# Patient Record
Sex: Male | Born: 1940
Health system: Southern US, Community
[De-identification: ages and names within clinical notes are randomized; demographics above are authoritative.]

## PROBLEM LIST (undated history)

## (undated) DIAGNOSIS — M1990 Unspecified osteoarthritis, unspecified site: Secondary | ICD-10-CM

## (undated) DIAGNOSIS — I498 Other specified cardiac arrhythmias: Secondary | ICD-10-CM

## (undated) DIAGNOSIS — J45909 Unspecified asthma, uncomplicated: Secondary | ICD-10-CM

## (undated) DIAGNOSIS — I2510 Atherosclerotic heart disease of native coronary artery without angina pectoris: Secondary | ICD-10-CM

## (undated) DIAGNOSIS — Z860100 Personal history of colon polyps, unspecified: Secondary | ICD-10-CM

## (undated) DIAGNOSIS — I739 Peripheral vascular disease, unspecified: Secondary | ICD-10-CM

## (undated) DIAGNOSIS — R634 Abnormal weight loss: Secondary | ICD-10-CM

## (undated) DIAGNOSIS — I10 Essential (primary) hypertension: Secondary | ICD-10-CM

## (undated) DIAGNOSIS — D696 Thrombocytopenia, unspecified: Secondary | ICD-10-CM

## (undated) DIAGNOSIS — M810 Age-related osteoporosis without current pathological fracture: Secondary | ICD-10-CM

## (undated) DIAGNOSIS — H409 Unspecified glaucoma: Secondary | ICD-10-CM

## (undated) DIAGNOSIS — T7840XA Allergy, unspecified, initial encounter: Secondary | ICD-10-CM

## (undated) DIAGNOSIS — Z87442 Personal history of urinary calculi: Secondary | ICD-10-CM

## (undated) DIAGNOSIS — I252 Old myocardial infarction: Secondary | ICD-10-CM

## (undated) DIAGNOSIS — M109 Gout, unspecified: Secondary | ICD-10-CM

## (undated) DIAGNOSIS — K900 Celiac disease: Secondary | ICD-10-CM

## (undated) DIAGNOSIS — K219 Gastro-esophageal reflux disease without esophagitis: Secondary | ICD-10-CM

## (undated) DIAGNOSIS — F419 Anxiety disorder, unspecified: Secondary | ICD-10-CM

## (undated) DIAGNOSIS — H3530 Unspecified macular degeneration: Secondary | ICD-10-CM

## (undated) DIAGNOSIS — E039 Hypothyroidism, unspecified: Secondary | ICD-10-CM

## (undated) DIAGNOSIS — E291 Testicular hypofunction: Secondary | ICD-10-CM

## (undated) DIAGNOSIS — H269 Unspecified cataract: Secondary | ICD-10-CM

## (undated) DIAGNOSIS — E7800 Pure hypercholesterolemia, unspecified: Secondary | ICD-10-CM

## (undated) DIAGNOSIS — R5381 Other malaise: Secondary | ICD-10-CM

## (undated) DIAGNOSIS — N189 Chronic kidney disease, unspecified: Secondary | ICD-10-CM

## (undated) DIAGNOSIS — R197 Diarrhea, unspecified: Secondary | ICD-10-CM

## (undated) DIAGNOSIS — N289 Disorder of kidney and ureter, unspecified: Secondary | ICD-10-CM

## (undated) DIAGNOSIS — Z7901 Long term (current) use of anticoagulants: Secondary | ICD-10-CM

## (undated) DIAGNOSIS — R5383 Other fatigue: Secondary | ICD-10-CM

## (undated) DIAGNOSIS — D126 Benign neoplasm of colon, unspecified: Secondary | ICD-10-CM

## (undated) HISTORY — DX: Celiac disease: K90.0

## (undated) HISTORY — DX: Other malaise: R53.83

## (undated) HISTORY — PX: OTHER SURGICAL HISTORY: SHX169

## (undated) HISTORY — DX: Unspecified glaucoma: H40.9

## (undated) HISTORY — DX: Diarrhea, unspecified: R19.7

## (undated) HISTORY — DX: Hypothyroidism, unspecified: E03.9

## (undated) HISTORY — PX: VASCULAR SURGERY: SHX849

## (undated) HISTORY — DX: Old myocardial infarction: I25.2

## (undated) HISTORY — DX: Personal history of colon polyps, unspecified: Z86.0100

## (undated) HISTORY — DX: Allergy, unspecified, initial encounter: T78.40XA

## (undated) HISTORY — DX: Unspecified cataract: H26.9

## (undated) HISTORY — DX: Essential (primary) hypertension: I10

## (undated) HISTORY — PX: EYE SURGERY: SHX253

## (undated) HISTORY — DX: Other malaise: R53.81

## (undated) HISTORY — DX: Abnormal weight loss: R63.4

## (undated) HISTORY — DX: Benign neoplasm of colon, unspecified: D12.6

## (undated) HISTORY — DX: Long term (current) use of anticoagulants: Z79.01

## (undated) HISTORY — DX: Unspecified osteoarthritis, unspecified site: M19.90

## (undated) HISTORY — PX: KNEE SURGERY: SHX244

## (undated) HISTORY — DX: Other specified cardiac arrhythmias: I49.8

## (undated) HISTORY — DX: Atherosclerotic heart disease of native coronary artery without angina pectoris: I25.10

## (undated) HISTORY — DX: Age-related osteoporosis without current pathological fracture: M81.0

## (undated) HISTORY — DX: Unspecified asthma, uncomplicated: J45.909

## (undated) HISTORY — DX: Testicular hypofunction: E29.1

## (undated) HISTORY — DX: Disorder of kidney and ureter, unspecified: N28.9

## (undated) HISTORY — DX: Peripheral vascular disease, unspecified: I73.9

## (undated) HISTORY — DX: Anxiety disorder, unspecified: F41.9

## (undated) HISTORY — PX: CORONARY ANGIOPLASTY: SHX604

## (undated) HISTORY — DX: Pure hypercholesterolemia, unspecified: E78.00

## (undated) HISTORY — PX: COLON SURGERY: SHX602

## (undated) HISTORY — DX: Gastro-esophageal reflux disease without esophagitis: K21.9

## (undated) HISTORY — DX: Personal history of colonic polyps: Z86.010

## (undated) HISTORY — DX: Gout, unspecified: M10.9

## (undated) HISTORY — PX: UPPER GASTROINTESTINAL ENDOSCOPY: SHX188

---

## 1996-10-31 HISTORY — PX: CYSTOSCOPY: SUR368

## 1999-04-08 ENCOUNTER — Encounter: Payer: Self-pay | Admitting: Internal Medicine

## 1999-04-08 ENCOUNTER — Ambulatory Visit (HOSPITAL_COMMUNITY): Admission: RE | Admit: 1999-04-08 | Discharge: 1999-04-08 | Payer: Self-pay | Admitting: Internal Medicine

## 1999-11-10 ENCOUNTER — Encounter: Admission: RE | Admit: 1999-11-10 | Discharge: 1999-11-10 | Payer: Self-pay | Admitting: Urology

## 1999-11-10 ENCOUNTER — Encounter: Payer: Self-pay | Admitting: Urology

## 1999-11-11 ENCOUNTER — Encounter: Payer: Self-pay | Admitting: Urology

## 1999-11-11 ENCOUNTER — Ambulatory Visit (HOSPITAL_COMMUNITY): Admission: RE | Admit: 1999-11-11 | Discharge: 1999-11-11 | Payer: Self-pay | Admitting: Urology

## 1999-11-16 ENCOUNTER — Encounter: Payer: Self-pay | Admitting: Urology

## 1999-11-16 ENCOUNTER — Encounter: Admission: RE | Admit: 1999-11-16 | Discharge: 1999-11-16 | Payer: Self-pay | Admitting: Urology

## 2001-03-07 ENCOUNTER — Encounter (INDEPENDENT_AMBULATORY_CARE_PROVIDER_SITE_OTHER): Payer: Self-pay | Admitting: Specialist

## 2001-03-07 ENCOUNTER — Other Ambulatory Visit: Admission: RE | Admit: 2001-03-07 | Discharge: 2001-03-07 | Payer: Self-pay | Admitting: Gastroenterology

## 2002-10-10 ENCOUNTER — Encounter: Admission: RE | Admit: 2002-10-10 | Discharge: 2002-10-10 | Payer: Self-pay | Admitting: Internal Medicine

## 2002-10-10 ENCOUNTER — Encounter: Payer: Self-pay | Admitting: Internal Medicine

## 2002-11-14 ENCOUNTER — Encounter: Admission: RE | Admit: 2002-11-14 | Discharge: 2002-11-14 | Payer: Self-pay | Admitting: Internal Medicine

## 2002-11-14 ENCOUNTER — Encounter: Payer: Self-pay | Admitting: Internal Medicine

## 2003-01-07 ENCOUNTER — Encounter: Payer: Self-pay | Admitting: Internal Medicine

## 2003-01-07 ENCOUNTER — Ambulatory Visit (HOSPITAL_COMMUNITY): Admission: RE | Admit: 2003-01-07 | Discharge: 2003-01-07 | Payer: Self-pay | Admitting: Internal Medicine

## 2003-09-10 ENCOUNTER — Encounter: Admission: RE | Admit: 2003-09-10 | Discharge: 2003-09-10 | Payer: Self-pay | Admitting: Neurosurgery

## 2003-09-24 ENCOUNTER — Encounter: Admission: RE | Admit: 2003-09-24 | Discharge: 2003-09-24 | Payer: Self-pay | Admitting: Neurosurgery

## 2003-10-08 ENCOUNTER — Encounter: Admission: RE | Admit: 2003-10-08 | Discharge: 2003-10-08 | Payer: Self-pay | Admitting: Neurosurgery

## 2003-12-26 ENCOUNTER — Encounter: Admission: RE | Admit: 2003-12-26 | Discharge: 2003-12-26 | Payer: Self-pay | Admitting: Neurosurgery

## 2004-06-03 ENCOUNTER — Encounter: Payer: Self-pay | Admitting: Emergency Medicine

## 2004-06-04 ENCOUNTER — Inpatient Hospital Stay (HOSPITAL_COMMUNITY): Admission: EM | Admit: 2004-06-04 | Discharge: 2004-06-08 | Payer: Self-pay | Admitting: Cardiology

## 2004-08-20 ENCOUNTER — Ambulatory Visit: Payer: Self-pay | Admitting: Cardiology

## 2004-08-23 ENCOUNTER — Encounter (HOSPITAL_COMMUNITY): Admission: RE | Admit: 2004-08-23 | Discharge: 2004-11-21 | Payer: Self-pay | Admitting: Cardiology

## 2004-09-30 ENCOUNTER — Ambulatory Visit: Payer: Self-pay | Admitting: Internal Medicine

## 2004-10-05 ENCOUNTER — Ambulatory Visit: Payer: Self-pay | Admitting: Cardiology

## 2004-11-22 ENCOUNTER — Encounter (HOSPITAL_COMMUNITY): Admission: RE | Admit: 2004-11-22 | Discharge: 2005-02-20 | Payer: Self-pay | Admitting: Cardiology

## 2004-11-22 ENCOUNTER — Ambulatory Visit: Payer: Self-pay | Admitting: Cardiology

## 2004-12-13 ENCOUNTER — Ambulatory Visit: Payer: Self-pay | Admitting: Internal Medicine

## 2005-05-16 ENCOUNTER — Ambulatory Visit: Payer: Self-pay | Admitting: Internal Medicine

## 2005-08-22 ENCOUNTER — Ambulatory Visit: Payer: Self-pay | Admitting: Cardiology

## 2005-09-01 ENCOUNTER — Ambulatory Visit: Payer: Self-pay | Admitting: Cardiology

## 2005-09-02 ENCOUNTER — Ambulatory Visit: Payer: Self-pay | Admitting: Hematology & Oncology

## 2005-12-01 ENCOUNTER — Ambulatory Visit: Payer: Self-pay | Admitting: Cardiology

## 2006-01-12 ENCOUNTER — Ambulatory Visit (HOSPITAL_BASED_OUTPATIENT_CLINIC_OR_DEPARTMENT_OTHER): Admission: RE | Admit: 2006-01-12 | Discharge: 2006-01-12 | Payer: Self-pay | Admitting: Orthopedic Surgery

## 2006-03-31 ENCOUNTER — Encounter: Admission: RE | Admit: 2006-03-31 | Discharge: 2006-03-31 | Payer: Self-pay | Admitting: Orthopedic Surgery

## 2006-05-08 ENCOUNTER — Ambulatory Visit: Payer: Self-pay | Admitting: Internal Medicine

## 2006-05-11 ENCOUNTER — Ambulatory Visit: Payer: Self-pay | Admitting: Gastroenterology

## 2006-05-15 ENCOUNTER — Ambulatory Visit: Payer: Self-pay | Admitting: Internal Medicine

## 2006-06-09 ENCOUNTER — Ambulatory Visit: Payer: Self-pay

## 2006-06-20 ENCOUNTER — Ambulatory Visit: Payer: Self-pay | Admitting: Cardiology

## 2006-07-07 ENCOUNTER — Encounter: Payer: Self-pay | Admitting: Gastroenterology

## 2006-07-07 ENCOUNTER — Ambulatory Visit: Payer: Self-pay | Admitting: Gastroenterology

## 2006-07-11 ENCOUNTER — Ambulatory Visit: Payer: Self-pay | Admitting: Internal Medicine

## 2006-09-11 ENCOUNTER — Ambulatory Visit: Payer: Self-pay | Admitting: Internal Medicine

## 2007-03-06 ENCOUNTER — Ambulatory Visit: Payer: Self-pay | Admitting: Cardiology

## 2007-03-12 ENCOUNTER — Ambulatory Visit: Payer: Self-pay | Admitting: Internal Medicine

## 2007-03-12 LAB — CONVERTED CEMR LAB
ALT: 32 units/L (ref 0–40)
AST: 31 units/L (ref 0–37)
Albumin: 3.9 g/dL (ref 3.5–5.2)
Alkaline Phosphatase: 64 units/L (ref 39–117)
BUN: 17 mg/dL (ref 6–23)
Basophils Absolute: 0 10*3/uL (ref 0.0–0.1)
Basophils Relative: 0.5 % (ref 0.0–1.0)
Bilirubin Urine: NEGATIVE
Bilirubin, Direct: 0.2 mg/dL (ref 0.0–0.3)
CO2: 31 meq/L (ref 19–32)
Calcium: 9 mg/dL (ref 8.4–10.5)
Chloride: 112 meq/L (ref 96–112)
Cholesterol: 92 mg/dL (ref 0–200)
Creatinine, Ser: 1.4 mg/dL (ref 0.4–1.5)
Eosinophils Absolute: 0.1 10*3/uL (ref 0.0–0.6)
Eosinophils Relative: 2.1 % (ref 0.0–5.0)
GFR calc Af Amer: 65 mL/min
GFR calc non Af Amer: 54 mL/min
Glucose, Bld: 106 mg/dL — ABNORMAL HIGH (ref 70–99)
HCT: 39.1 % (ref 39.0–52.0)
HDL: 25.2 mg/dL — ABNORMAL LOW (ref >39.0)
Hemoglobin, Urine: NEGATIVE
Hemoglobin: 13.1 g/dL (ref 13.0–17.0)
Ketones, ur: NEGATIVE mg/dL
LDL Cholesterol: 51 mg/dL (ref 0–99)
Leukocytes, UA: NEGATIVE
Lymphocytes Relative: 23.6 % (ref 12.0–46.0)
MCHC: 33.6 g/dL (ref 30.0–36.0)
MCV: 89.9 fL (ref 78.0–100.0)
Monocytes Absolute: 0.3 10*3/uL (ref 0.2–0.7)
Monocytes Relative: 9 % (ref 3.0–11.0)
Neutro Abs: 2.4 10*3/uL (ref 1.4–7.7)
Neutrophils Relative %: 64.8 % (ref 43.0–77.0)
Nitrite: NEGATIVE
PSA: 1.31 ng/mL (ref 0.10–4.00)
Platelets: 103 10*3/uL — ABNORMAL LOW (ref 150–400)
Potassium: 5.1 meq/L (ref 3.5–5.1)
RBC: 4.35 M/uL (ref 4.22–5.81)
RDW: 14.7 % — ABNORMAL HIGH (ref 11.5–14.6)
Sodium: 146 meq/L — ABNORMAL HIGH (ref 135–145)
Specific Gravity, Urine: 1.025 (ref 1.000–1.03)
TSH: 4.92 microintl units/mL (ref 0.35–5.50)
Testosterone: 387.49 ng/dL (ref 350.00–890)
Total Bilirubin: 1 mg/dL (ref 0.3–1.2)
Total CHOL/HDL Ratio: 3.7
Total Protein, Urine: NEGATIVE mg/dL
Total Protein: 5.9 g/dL — ABNORMAL LOW (ref 6.0–8.3)
Triglycerides: 79 mg/dL (ref 0–149)
Urine Glucose: NEGATIVE mg/dL
Urobilinogen, UA: 0.2 (ref 0.0–1.0)
VLDL: 16 mg/dL (ref 0–40)
WBC: 3.7 10*3/uL — ABNORMAL LOW (ref 4.5–10.5)
pH: 6 (ref 5.0–8.0)

## 2007-05-23 ENCOUNTER — Ambulatory Visit (HOSPITAL_BASED_OUTPATIENT_CLINIC_OR_DEPARTMENT_OTHER): Admission: RE | Admit: 2007-05-23 | Discharge: 2007-05-23 | Payer: Self-pay | Admitting: Urology

## 2007-07-25 ENCOUNTER — Ambulatory Visit: Payer: Self-pay | Admitting: Internal Medicine

## 2007-07-31 ENCOUNTER — Ambulatory Visit: Payer: Self-pay

## 2007-08-11 ENCOUNTER — Encounter: Payer: Self-pay | Admitting: Internal Medicine

## 2007-08-11 DIAGNOSIS — I25709 Atherosclerosis of coronary artery bypass graft(s), unspecified, with unspecified angina pectoris: Secondary | ICD-10-CM | POA: Insufficient documentation

## 2007-08-11 DIAGNOSIS — I10 Essential (primary) hypertension: Secondary | ICD-10-CM

## 2007-08-11 DIAGNOSIS — M109 Gout, unspecified: Secondary | ICD-10-CM

## 2007-08-11 DIAGNOSIS — I252 Old myocardial infarction: Secondary | ICD-10-CM

## 2007-08-11 DIAGNOSIS — I2510 Atherosclerotic heart disease of native coronary artery without angina pectoris: Secondary | ICD-10-CM | POA: Insufficient documentation

## 2007-08-11 DIAGNOSIS — E039 Hypothyroidism, unspecified: Secondary | ICD-10-CM | POA: Insufficient documentation

## 2007-08-22 ENCOUNTER — Telehealth: Payer: Self-pay | Admitting: Internal Medicine

## 2008-01-31 ENCOUNTER — Ambulatory Visit: Payer: Self-pay | Admitting: Internal Medicine

## 2008-01-31 DIAGNOSIS — R197 Diarrhea, unspecified: Secondary | ICD-10-CM | POA: Insufficient documentation

## 2008-01-31 DIAGNOSIS — J4540 Moderate persistent asthma, uncomplicated: Secondary | ICD-10-CM | POA: Insufficient documentation

## 2008-02-25 ENCOUNTER — Ambulatory Visit: Payer: Self-pay | Admitting: Gastroenterology

## 2008-03-03 ENCOUNTER — Telehealth: Payer: Self-pay | Admitting: Gastroenterology

## 2008-03-04 ENCOUNTER — Ambulatory Visit: Payer: Self-pay | Admitting: Gastroenterology

## 2008-03-04 LAB — CONVERTED CEMR LAB
ALT: 38 units/L (ref 0–53)
AST: 35 units/L (ref 0–37)
Albumin: 3.5 g/dL (ref 3.5–5.2)
Alkaline Phosphatase: 84 units/L (ref 39–117)
BUN: 13 mg/dL (ref 6–23)
Basophils Absolute: 0 10*3/uL (ref 0.0–0.1)
Basophils Relative: 0.3 % (ref 0.0–1.0)
CO2: 31 meq/L (ref 19–32)
Calcium: 8.9 mg/dL (ref 8.4–10.5)
Chloride: 111 meq/L (ref 96–112)
Creatinine, Ser: 1.5 mg/dL (ref 0.4–1.5)
Eosinophils Absolute: 0.1 10*3/uL (ref 0.0–0.7)
Eosinophils Relative: 2.7 % (ref 0.0–5.0)
GFR calc Af Amer: 60 mL/min
GFR calc non Af Amer: 50 mL/min
Glucose, Bld: 113 mg/dL — ABNORMAL HIGH (ref 70–99)
HCT: 39.8 % (ref 39.0–52.0)
Hemoglobin: 13.1 g/dL (ref 13.0–17.0)
Lymphocytes Relative: 21.5 % (ref 12.0–46.0)
MCHC: 33 g/dL (ref 30.0–36.0)
MCV: 92.1 fL (ref 78.0–100.0)
Monocytes Absolute: 0.4 10*3/uL (ref 0.1–1.0)
Monocytes Relative: 9.6 % (ref 3.0–12.0)
Neutro Abs: 2.9 10*3/uL (ref 1.4–7.7)
Neutrophils Relative %: 65.9 % (ref 43.0–77.0)
Platelets: 121 10*3/uL — ABNORMAL LOW (ref 150–400)
Potassium: 4.8 meq/L (ref 3.5–5.1)
RBC: 4.32 M/uL (ref 4.22–5.81)
RDW: 13.9 % (ref 11.5–14.6)
Sodium: 146 meq/L — ABNORMAL HIGH (ref 135–145)
TSH: 5.45 microintl units/mL (ref 0.35–5.50)
Tissue Transglutaminase Ab, IgA: 0 units (ref <7)
Total Bilirubin: 0.9 mg/dL (ref 0.3–1.2)
Total Protein: 5.8 g/dL — ABNORMAL LOW (ref 6.0–8.3)
WBC: 4.3 10*3/uL — ABNORMAL LOW (ref 4.5–10.5)

## 2008-03-05 ENCOUNTER — Ambulatory Visit: Payer: Self-pay | Admitting: Internal Medicine

## 2008-03-05 ENCOUNTER — Encounter: Payer: Self-pay | Admitting: Gastroenterology

## 2008-03-10 ENCOUNTER — Encounter (INDEPENDENT_AMBULATORY_CARE_PROVIDER_SITE_OTHER): Payer: Self-pay

## 2008-03-25 ENCOUNTER — Telehealth: Payer: Self-pay | Admitting: Gastroenterology

## 2008-04-01 DIAGNOSIS — D126 Benign neoplasm of colon, unspecified: Secondary | ICD-10-CM

## 2008-04-02 ENCOUNTER — Ambulatory Visit: Payer: Self-pay | Admitting: Gastroenterology

## 2008-04-02 DIAGNOSIS — K219 Gastro-esophageal reflux disease without esophagitis: Secondary | ICD-10-CM | POA: Insufficient documentation

## 2008-04-08 ENCOUNTER — Ambulatory Visit: Payer: Self-pay | Admitting: Gastroenterology

## 2008-04-08 ENCOUNTER — Encounter: Payer: Self-pay | Admitting: Gastroenterology

## 2008-04-08 DIAGNOSIS — R634 Abnormal weight loss: Secondary | ICD-10-CM | POA: Insufficient documentation

## 2008-04-09 ENCOUNTER — Encounter: Payer: Self-pay | Admitting: Gastroenterology

## 2008-04-10 ENCOUNTER — Telehealth (INDEPENDENT_AMBULATORY_CARE_PROVIDER_SITE_OTHER): Payer: Self-pay | Admitting: *Deleted

## 2008-04-10 ENCOUNTER — Ambulatory Visit: Payer: Self-pay | Admitting: Gastroenterology

## 2008-04-13 ENCOUNTER — Encounter: Payer: Self-pay | Admitting: Gastroenterology

## 2008-04-15 LAB — CONVERTED CEMR LAB: Tissue Transglutaminase Ab, IgA: 0 units (ref <7)

## 2008-04-22 ENCOUNTER — Encounter: Admission: RE | Admit: 2008-04-22 | Discharge: 2008-04-22 | Payer: Self-pay | Admitting: Gastroenterology

## 2008-04-29 ENCOUNTER — Ambulatory Visit: Payer: Self-pay | Admitting: Gastroenterology

## 2008-04-29 ENCOUNTER — Encounter: Admission: RE | Admit: 2008-04-29 | Discharge: 2008-04-29 | Payer: Self-pay | Admitting: Gastroenterology

## 2008-04-29 DIAGNOSIS — K900 Celiac disease: Secondary | ICD-10-CM

## 2008-04-29 DIAGNOSIS — K8020 Calculus of gallbladder without cholecystitis without obstruction: Secondary | ICD-10-CM | POA: Insufficient documentation

## 2008-05-05 ENCOUNTER — Encounter: Payer: Self-pay | Admitting: Gastroenterology

## 2008-05-05 ENCOUNTER — Ambulatory Visit: Payer: Self-pay | Admitting: Family Medicine

## 2008-05-19 ENCOUNTER — Telehealth (INDEPENDENT_AMBULATORY_CARE_PROVIDER_SITE_OTHER): Payer: Self-pay

## 2008-05-22 ENCOUNTER — Encounter (INDEPENDENT_AMBULATORY_CARE_PROVIDER_SITE_OTHER): Payer: Self-pay

## 2008-05-28 ENCOUNTER — Telehealth: Payer: Self-pay | Admitting: Gastroenterology

## 2008-06-03 ENCOUNTER — Ambulatory Visit: Payer: Self-pay | Admitting: Internal Medicine

## 2008-06-03 DIAGNOSIS — M810 Age-related osteoporosis without current pathological fracture: Secondary | ICD-10-CM | POA: Insufficient documentation

## 2008-06-03 LAB — CONVERTED CEMR LAB: Vit D, 1,25-Dihydroxy: 34 (ref 30–89)

## 2008-06-04 LAB — CONVERTED CEMR LAB
ALT: 24 units/L (ref 0–53)
AST: 25 units/L (ref 0–37)
BUN: 15 mg/dL (ref 6–23)
Basophils Absolute: 0 10*3/uL (ref 0.0–0.1)
Basophils Relative: 0.3 % (ref 0.0–3.0)
CO2: 27 meq/L (ref 19–32)
Calcium: 9 mg/dL (ref 8.4–10.5)
Chloride: 114 meq/L — ABNORMAL HIGH (ref 96–112)
Creatinine, Ser: 1.2 mg/dL (ref 0.4–1.5)
Eosinophils Absolute: 0.2 10*3/uL (ref 0.0–0.7)
Eosinophils Relative: 3.4 % (ref 0.0–5.0)
GFR calc Af Amer: 78 mL/min
GFR calc non Af Amer: 64 mL/min
Glucose, Bld: 97 mg/dL (ref 70–99)
HCT: 36.1 % — ABNORMAL LOW (ref 39.0–52.0)
Hemoglobin: 12.5 g/dL — ABNORMAL LOW (ref 13.0–17.0)
Lymphocytes Relative: 18.3 % (ref 12.0–46.0)
MCHC: 34.6 g/dL (ref 30.0–36.0)
MCV: 92.2 fL (ref 78.0–100.0)
Monocytes Absolute: 0.4 10*3/uL (ref 0.1–1.0)
Monocytes Relative: 7.9 % (ref 3.0–12.0)
Neutro Abs: 3.4 10*3/uL (ref 1.4–7.7)
Neutrophils Relative %: 70.1 % (ref 43.0–77.0)
Platelets: 133 10*3/uL — ABNORMAL LOW (ref 150–400)
Potassium: 4 meq/L (ref 3.5–5.1)
RBC: 3.92 M/uL — ABNORMAL LOW (ref 4.22–5.81)
RDW: 14.8 % — ABNORMAL HIGH (ref 11.5–14.6)
Sodium: 147 meq/L — ABNORMAL HIGH (ref 135–145)
TSH: 2.58 microintl units/mL (ref 0.35–5.50)
Testosterone: 232.65 ng/dL — ABNORMAL LOW (ref 350.00–890)
Vitamin B-12: 538 pg/mL (ref 211–911)
WBC: 4.9 10*3/uL (ref 4.5–10.5)

## 2008-07-01 ENCOUNTER — Ambulatory Visit: Payer: Self-pay

## 2008-07-01 ENCOUNTER — Encounter: Payer: Self-pay | Admitting: Internal Medicine

## 2008-07-02 ENCOUNTER — Ambulatory Visit: Payer: Self-pay | Admitting: Gastroenterology

## 2008-07-02 DIAGNOSIS — Z860100 Personal history of colon polyps, unspecified: Secondary | ICD-10-CM | POA: Insufficient documentation

## 2008-07-03 ENCOUNTER — Telehealth: Payer: Self-pay | Admitting: Internal Medicine

## 2008-07-03 ENCOUNTER — Ambulatory Visit: Payer: Self-pay | Admitting: Internal Medicine

## 2008-07-03 DIAGNOSIS — E291 Testicular hypofunction: Secondary | ICD-10-CM | POA: Insufficient documentation

## 2008-07-03 DIAGNOSIS — I498 Other specified cardiac arrhythmias: Secondary | ICD-10-CM

## 2008-07-04 ENCOUNTER — Ambulatory Visit: Payer: Self-pay | Admitting: Internal Medicine

## 2008-07-30 ENCOUNTER — Ambulatory Visit: Payer: Self-pay | Admitting: Internal Medicine

## 2008-08-04 ENCOUNTER — Ambulatory Visit: Payer: Self-pay | Admitting: Cardiology

## 2008-09-23 ENCOUNTER — Ambulatory Visit: Payer: Self-pay | Admitting: Internal Medicine

## 2008-09-26 LAB — CONVERTED CEMR LAB
ALT: 25 units/L (ref 0–53)
AST: 30 units/L (ref 0–37)
Albumin: 3.4 g/dL — ABNORMAL LOW (ref 3.5–5.2)
Alkaline Phosphatase: 105 units/L (ref 39–117)
BUN: 11 mg/dL (ref 6–23)
Bilirubin, Direct: 0.2 mg/dL (ref 0.0–0.3)
CO2: 29 meq/L (ref 19–32)
Calcium: 8.4 mg/dL (ref 8.4–10.5)
Chloride: 107 meq/L (ref 96–112)
Cholesterol: 74 mg/dL (ref 0–200)
Creatinine, Ser: 1.3 mg/dL (ref 0.4–1.5)
GFR calc Af Amer: 71 mL/min
GFR calc non Af Amer: 59 mL/min
Glucose, Bld: 89 mg/dL (ref 70–99)
HDL: 21.2 mg/dL — ABNORMAL LOW (ref >39.0)
Hgb A1c MFr Bld: 5.3 % (ref 4.6–6.0)
LDL Cholesterol: 40 mg/dL (ref 0–99)
Potassium: 4.7 meq/L (ref 3.5–5.1)
Sodium: 142 meq/L (ref 135–145)
Testosterone: 974.17 ng/dL — ABNORMAL HIGH (ref 350.00–890)
Total Bilirubin: 0.8 mg/dL (ref 0.3–1.2)
Total CHOL/HDL Ratio: 3.5
Total Protein: 6 g/dL (ref 6.0–8.3)
Triglycerides: 64 mg/dL (ref 0–149)
VLDL: 13 mg/dL (ref 0–40)

## 2008-10-01 ENCOUNTER — Ambulatory Visit: Payer: Self-pay | Admitting: Internal Medicine

## 2008-11-10 ENCOUNTER — Encounter: Payer: Self-pay | Admitting: Internal Medicine

## 2008-11-27 ENCOUNTER — Telehealth: Payer: Self-pay | Admitting: Internal Medicine

## 2009-01-05 ENCOUNTER — Ambulatory Visit: Payer: Self-pay | Admitting: Internal Medicine

## 2009-01-05 LAB — CONVERTED CEMR LAB
ALT: 24 units/L (ref 0–53)
AST: 27 units/L (ref 0–37)
Albumin: 3.4 g/dL — ABNORMAL LOW (ref 3.5–5.2)
Alkaline Phosphatase: 101 units/L (ref 39–117)
BUN: 13 mg/dL (ref 6–23)
Bilirubin, Direct: 0.2 mg/dL (ref 0.0–0.3)
CO2: 30 meq/L (ref 19–32)
Calcium: 8.4 mg/dL (ref 8.4–10.5)
Chloride: 110 meq/L (ref 96–112)
Cholesterol: 78 mg/dL (ref 0–200)
Creatinine, Ser: 1.8 mg/dL — ABNORMAL HIGH (ref 0.4–1.5)
GFR calc Af Amer: 49 mL/min
GFR calc non Af Amer: 40 mL/min
Glucose, Bld: 99 mg/dL (ref 70–99)
HDL: 24 mg/dL — ABNORMAL LOW (ref >39.0)
LDL Cholesterol: 45 mg/dL (ref 0–99)
Potassium: 5.2 meq/L — ABNORMAL HIGH (ref 3.5–5.1)
Sodium: 145 meq/L (ref 135–145)
TSH: 3.27 microintl units/mL (ref 0.35–5.50)
Total Bilirubin: 0.9 mg/dL (ref 0.3–1.2)
Total CHOL/HDL Ratio: 3.3
Total Protein: 5.5 g/dL — ABNORMAL LOW (ref 6.0–8.3)
Triglycerides: 43 mg/dL (ref 0–149)
VLDL: 9 mg/dL (ref 0–40)
Vitamin B-12: 487 pg/mL (ref 211–911)

## 2009-01-08 ENCOUNTER — Ambulatory Visit: Payer: Self-pay | Admitting: Internal Medicine

## 2009-01-09 ENCOUNTER — Encounter: Payer: Self-pay | Admitting: Internal Medicine

## 2009-01-14 ENCOUNTER — Telehealth: Payer: Self-pay | Admitting: Internal Medicine

## 2009-01-14 ENCOUNTER — Encounter: Payer: Self-pay | Admitting: Internal Medicine

## 2009-04-07 ENCOUNTER — Telehealth: Payer: Self-pay | Admitting: Internal Medicine

## 2009-05-05 ENCOUNTER — Ambulatory Visit: Payer: Self-pay | Admitting: Internal Medicine

## 2009-05-05 LAB — CONVERTED CEMR LAB
BUN: 14 mg/dL (ref 6–23)
CO2: 30 meq/L (ref 19–32)
Calcium: 8.5 mg/dL (ref 8.4–10.5)
Chloride: 106 meq/L (ref 96–112)
Cholesterol: 152 mg/dL (ref 0–200)
Creatinine, Ser: 2 mg/dL — ABNORMAL HIGH (ref 0.4–1.5)
GFR calc non Af Amer: 35.53 mL/min (ref >60)
Glucose, Bld: 91 mg/dL (ref 70–99)
HDL: 31.5 mg/dL — ABNORMAL LOW (ref >39.00)
Hgb A1c MFr Bld: 5.4 % (ref 4.6–6.5)
LDL Cholesterol: 108 mg/dL — ABNORMAL HIGH (ref 0–99)
Potassium: 5.4 meq/L — ABNORMAL HIGH (ref 3.5–5.1)
Sodium: 141 meq/L (ref 135–145)
TSH: 4.44 microintl units/mL (ref 0.35–5.50)
Total CHOL/HDL Ratio: 5
Triglycerides: 63 mg/dL (ref 0.0–149.0)
VLDL: 12.6 mg/dL (ref 0.0–40.0)

## 2009-05-12 ENCOUNTER — Ambulatory Visit: Payer: Self-pay | Admitting: Internal Medicine

## 2009-06-10 ENCOUNTER — Encounter (INDEPENDENT_AMBULATORY_CARE_PROVIDER_SITE_OTHER): Payer: Self-pay | Admitting: *Deleted

## 2009-06-17 ENCOUNTER — Encounter: Payer: Self-pay | Admitting: Internal Medicine

## 2009-08-05 ENCOUNTER — Ambulatory Visit: Payer: Self-pay | Admitting: Gastroenterology

## 2009-08-12 ENCOUNTER — Telehealth: Payer: Self-pay | Admitting: Gastroenterology

## 2009-08-12 ENCOUNTER — Ambulatory Visit: Payer: Self-pay | Admitting: Internal Medicine

## 2009-08-19 ENCOUNTER — Encounter: Payer: Self-pay | Admitting: Gastroenterology

## 2009-08-19 ENCOUNTER — Ambulatory Visit: Payer: Self-pay | Admitting: Gastroenterology

## 2009-08-19 LAB — HM COLONOSCOPY

## 2009-08-21 ENCOUNTER — Encounter: Payer: Self-pay | Admitting: Gastroenterology

## 2009-08-31 DIAGNOSIS — E7800 Pure hypercholesterolemia, unspecified: Secondary | ICD-10-CM | POA: Insufficient documentation

## 2009-09-01 ENCOUNTER — Telehealth: Payer: Self-pay | Admitting: Cardiology

## 2009-09-01 ENCOUNTER — Ambulatory Visit: Payer: Self-pay | Admitting: Cardiology

## 2009-09-29 ENCOUNTER — Telehealth: Payer: Self-pay | Admitting: Internal Medicine

## 2009-10-31 HISTORY — PX: CORONARY STENT PLACEMENT: SHX1402

## 2009-11-03 ENCOUNTER — Ambulatory Visit: Payer: Self-pay | Admitting: Internal Medicine

## 2009-11-04 LAB — CONVERTED CEMR LAB
ALT: 21 units/L (ref 0–53)
AST: 24 units/L (ref 0–37)
Albumin: 3.7 g/dL (ref 3.5–5.2)
Alkaline Phosphatase: 53 units/L (ref 39–117)
BUN: 14 mg/dL (ref 6–23)
Basophils Absolute: 0 10*3/uL (ref 0.0–0.1)
Basophils Relative: 0.9 % (ref 0.0–3.0)
Bilirubin Urine: NEGATIVE
Bilirubin, Direct: 0.2 mg/dL (ref 0.0–0.3)
CO2: 28 meq/L (ref 19–32)
Calcium: 8.6 mg/dL (ref 8.4–10.5)
Chloride: 106 meq/L (ref 96–112)
Cholesterol: 122 mg/dL (ref 0–200)
Creatinine, Ser: 1.8 mg/dL — ABNORMAL HIGH (ref 0.4–1.5)
Eosinophils Absolute: 0.2 10*3/uL (ref 0.0–0.7)
Eosinophils Relative: 3.8 % (ref 0.0–5.0)
GFR calc non Af Amer: 40.07 mL/min (ref >60)
Glucose, Bld: 88 mg/dL (ref 70–99)
HCT: 49.6 % (ref 39.0–52.0)
HDL: 33.2 mg/dL — ABNORMAL LOW (ref >39.00)
Hemoglobin: 16.4 g/dL (ref 13.0–17.0)
Ketones, ur: NEGATIVE mg/dL
LDL Cholesterol: 77 mg/dL (ref 0–99)
Leukocytes, UA: NEGATIVE
Lymphocytes Relative: 22.7 % (ref 12.0–46.0)
Lymphs Abs: 1.2 10*3/uL (ref 0.7–4.0)
MCHC: 33 g/dL (ref 30.0–36.0)
MCV: 94.7 fL (ref 78.0–100.0)
Monocytes Absolute: 0.4 10*3/uL (ref 0.1–1.0)
Monocytes Relative: 7.6 % (ref 3.0–12.0)
Neutro Abs: 3.4 10*3/uL (ref 1.4–7.7)
Neutrophils Relative %: 65 % (ref 43.0–77.0)
Nitrite: NEGATIVE
PSA: 2.45 ng/mL (ref 0.10–4.00)
Platelets: 114 10*3/uL — ABNORMAL LOW (ref 150.0–400.0)
Potassium: 5 meq/L (ref 3.5–5.1)
RBC: 5.24 M/uL (ref 4.22–5.81)
RDW: 13.4 % (ref 11.5–14.6)
Sodium: 141 meq/L (ref 135–145)
Specific Gravity, Urine: 1.02 (ref 1.000–1.030)
Total Bilirubin: 1 mg/dL (ref 0.3–1.2)
Total CHOL/HDL Ratio: 4
Total Protein, Urine: NEGATIVE mg/dL
Total Protein: 6 g/dL (ref 6.0–8.3)
Triglycerides: 60 mg/dL (ref 0.0–149.0)
Urine Glucose: NEGATIVE mg/dL
Urobilinogen, UA: 0.2 (ref 0.0–1.0)
VLDL: 12 mg/dL (ref 0.0–40.0)
WBC: 5.2 10*3/uL (ref 4.5–10.5)
pH: 6 (ref 5.0–8.0)

## 2009-11-09 ENCOUNTER — Ambulatory Visit: Payer: Self-pay | Admitting: Internal Medicine

## 2009-11-09 DIAGNOSIS — R972 Elevated prostate specific antigen [PSA]: Secondary | ICD-10-CM | POA: Insufficient documentation

## 2009-11-09 DIAGNOSIS — D485 Neoplasm of uncertain behavior of skin: Secondary | ICD-10-CM | POA: Insufficient documentation

## 2010-01-05 ENCOUNTER — Encounter: Payer: Self-pay | Admitting: Internal Medicine

## 2010-03-03 ENCOUNTER — Ambulatory Visit: Payer: Self-pay | Admitting: Internal Medicine

## 2010-03-03 LAB — CONVERTED CEMR LAB
ALT: 20 units/L (ref 0–53)
AST: 27 units/L (ref 0–37)
Albumin: 3.8 g/dL (ref 3.5–5.2)
Alkaline Phosphatase: 53 units/L (ref 39–117)
BUN: 14 mg/dL (ref 6–23)
Basophils Absolute: 0 10*3/uL (ref 0.0–0.1)
Basophils Relative: 0.5 % (ref 0.0–3.0)
Bilirubin Urine: NEGATIVE
Bilirubin, Direct: 0.2 mg/dL (ref 0.0–0.3)
CO2: 32 meq/L (ref 19–32)
Calcium: 9 mg/dL (ref 8.4–10.5)
Chloride: 106 meq/L (ref 96–112)
Creatinine, Ser: 2 mg/dL — ABNORMAL HIGH (ref 0.4–1.5)
Eosinophils Absolute: 0.2 10*3/uL (ref 0.0–0.7)
Eosinophils Relative: 3.8 % (ref 0.0–5.0)
GFR calc non Af Amer: 35.44 mL/min (ref >60)
Glucose, Bld: 81 mg/dL (ref 70–99)
HCT: 45.7 % (ref 39.0–52.0)
Hemoglobin, Urine: NEGATIVE
Hemoglobin: 15.8 g/dL (ref 13.0–17.0)
Ketones, ur: NEGATIVE mg/dL
Leukocytes, UA: NEGATIVE
Lymphocytes Relative: 19.2 % (ref 12.0–46.0)
Lymphs Abs: 1.1 10*3/uL (ref 0.7–4.0)
MCHC: 34.5 g/dL (ref 30.0–36.0)
MCV: 92.3 fL (ref 78.0–100.0)
Monocytes Absolute: 0.5 10*3/uL (ref 0.1–1.0)
Monocytes Relative: 8.7 % (ref 3.0–12.0)
Neutro Abs: 3.8 10*3/uL (ref 1.4–7.7)
Neutrophils Relative %: 67.8 % (ref 43.0–77.0)
Nitrite: NEGATIVE
PSA: 3.25 ng/mL (ref 0.10–4.00)
Platelets: 114 10*3/uL — ABNORMAL LOW (ref 150.0–400.0)
Potassium: 5.3 meq/L — ABNORMAL HIGH (ref 3.5–5.1)
RBC: 4.95 M/uL (ref 4.22–5.81)
RDW: 14.4 % (ref 11.5–14.6)
Sodium: 143 meq/L (ref 135–145)
Specific Gravity, Urine: 1.02 (ref 1.000–1.030)
Testosterone: 530.48 ng/dL (ref 350.00–890.00)
Total Bilirubin: 1.1 mg/dL (ref 0.3–1.2)
Total Protein, Urine: NEGATIVE mg/dL
Total Protein: 5.7 g/dL — ABNORMAL LOW (ref 6.0–8.3)
Urine Glucose: NEGATIVE mg/dL
Urobilinogen, UA: 0.2 (ref 0.0–1.0)
WBC: 5.7 10*3/uL (ref 4.5–10.5)
pH: 7.5 (ref 5.0–8.0)

## 2010-03-10 ENCOUNTER — Telehealth: Payer: Self-pay | Admitting: Internal Medicine

## 2010-03-10 ENCOUNTER — Ambulatory Visit: Payer: Self-pay | Admitting: Internal Medicine

## 2010-03-10 DIAGNOSIS — N259 Disorder resulting from impaired renal tubular function, unspecified: Secondary | ICD-10-CM | POA: Insufficient documentation

## 2010-03-22 ENCOUNTER — Encounter: Admission: RE | Admit: 2010-03-22 | Discharge: 2010-03-22 | Payer: Self-pay | Admitting: Internal Medicine

## 2010-04-01 ENCOUNTER — Encounter: Payer: Self-pay | Admitting: Internal Medicine

## 2010-04-02 ENCOUNTER — Encounter: Payer: Self-pay | Admitting: Internal Medicine

## 2010-04-06 ENCOUNTER — Telehealth: Payer: Self-pay | Admitting: Internal Medicine

## 2010-04-21 ENCOUNTER — Telehealth: Payer: Self-pay | Admitting: Internal Medicine

## 2010-06-07 ENCOUNTER — Telehealth: Payer: Self-pay | Admitting: Internal Medicine

## 2010-06-23 ENCOUNTER — Encounter: Admission: RE | Admit: 2010-06-23 | Discharge: 2010-07-26 | Payer: Self-pay | Admitting: Sports Medicine

## 2010-06-29 ENCOUNTER — Ambulatory Visit: Payer: Self-pay | Admitting: Internal Medicine

## 2010-06-29 LAB — CONVERTED CEMR LAB

## 2010-06-30 LAB — CONVERTED CEMR LAB
BUN: 18 mg/dL (ref 6–23)
Basophils Absolute: 0 10*3/uL (ref 0.0–0.1)
Basophils Relative: 0.8 % (ref 0.0–3.0)
CO2: 29 meq/L (ref 19–32)
Calcium: 9.1 mg/dL (ref 8.4–10.5)
Chloride: 104 meq/L (ref 96–112)
Creatinine, Ser: 1.9 mg/dL — ABNORMAL HIGH (ref 0.4–1.5)
Eosinophils Absolute: 0.2 10*3/uL (ref 0.0–0.7)
Eosinophils Relative: 3.5 % (ref 0.0–5.0)
GFR calc non Af Amer: 38.03 mL/min (ref >60)
Glucose, Bld: 83 mg/dL (ref 70–99)
HCT: 48.4 % (ref 39.0–52.0)
Hemoglobin: 16.4 g/dL (ref 13.0–17.0)
Lymphocytes Relative: 20.6 % (ref 12.0–46.0)
Lymphs Abs: 1.1 10*3/uL (ref 0.7–4.0)
MCHC: 33.9 g/dL (ref 30.0–36.0)
MCV: 93.8 fL (ref 78.0–100.0)
Monocytes Absolute: 0.4 10*3/uL (ref 0.1–1.0)
Monocytes Relative: 8.6 % (ref 3.0–12.0)
Neutro Abs: 3.5 10*3/uL (ref 1.4–7.7)
Neutrophils Relative %: 66.5 % (ref 43.0–77.0)
PSA: 1.88 ng/mL (ref 0.10–4.00)
Platelets: 109 10*3/uL — ABNORMAL LOW (ref 150.0–400.0)
Potassium: 5.2 meq/L — ABNORMAL HIGH (ref 3.5–5.1)
RBC: 5.16 M/uL (ref 4.22–5.81)
RDW: 14.3 % (ref 11.5–14.6)
Sodium: 141 meq/L (ref 135–145)
WBC: 5.2 10*3/uL (ref 4.5–10.5)

## 2010-07-02 ENCOUNTER — Encounter: Payer: Self-pay | Admitting: Internal Medicine

## 2010-07-07 ENCOUNTER — Ambulatory Visit: Payer: Self-pay | Admitting: Internal Medicine

## 2010-07-07 DIAGNOSIS — H0019 Chalazion unspecified eye, unspecified eyelid: Secondary | ICD-10-CM | POA: Insufficient documentation

## 2010-07-09 LAB — CONVERTED CEMR LAB
Calcium, Total (PTH): 9.1 mg/dL (ref 8.4–10.5)
PTH: 72.4 pg/mL — ABNORMAL HIGH (ref 14.0–72.0)

## 2010-07-12 ENCOUNTER — Encounter: Payer: Self-pay | Admitting: Internal Medicine

## 2010-07-13 ENCOUNTER — Ambulatory Visit: Payer: Self-pay

## 2010-07-13 ENCOUNTER — Encounter: Payer: Self-pay | Admitting: Internal Medicine

## 2010-07-27 ENCOUNTER — Encounter (INDEPENDENT_AMBULATORY_CARE_PROVIDER_SITE_OTHER): Payer: Self-pay | Admitting: *Deleted

## 2010-10-12 ENCOUNTER — Encounter: Payer: Self-pay | Admitting: Cardiology

## 2010-10-12 ENCOUNTER — Ambulatory Visit: Payer: Self-pay | Admitting: Cardiology

## 2010-11-17 ENCOUNTER — Other Ambulatory Visit: Payer: Self-pay | Admitting: Internal Medicine

## 2010-11-17 ENCOUNTER — Ambulatory Visit
Admission: RE | Admit: 2010-11-17 | Discharge: 2010-11-17 | Payer: Self-pay | Source: Home / Self Care | Attending: Internal Medicine | Admitting: Internal Medicine

## 2010-11-17 LAB — TSH: TSH: 6.28 u[IU]/mL — ABNORMAL HIGH (ref 0.35–5.50)

## 2010-11-17 LAB — URINALYSIS
Bilirubin Urine: NEGATIVE
Hemoglobin, Urine: NEGATIVE
Ketones, ur: NEGATIVE
Leukocytes, UA: NEGATIVE
Nitrite: NEGATIVE
Specific Gravity, Urine: 1.01 (ref 1.000–1.030)
Total Protein, Urine: NEGATIVE
Urine Glucose: NEGATIVE
Urobilinogen, UA: 0.2 (ref 0.0–1.0)
pH: 7 (ref 5.0–8.0)

## 2010-11-17 LAB — LIPID PANEL
Cholesterol: 140 mg/dL (ref 0–200)
HDL: 34.5 mg/dL — ABNORMAL LOW (ref >39.00)
LDL Cholesterol: 89 mg/dL (ref 0–99)
Total CHOL/HDL Ratio: 4
Triglycerides: 82 mg/dL (ref 0.0–149.0)
VLDL: 16.4 mg/dL (ref 0.0–40.0)

## 2010-11-17 LAB — CBC WITH DIFFERENTIAL/PLATELET
Basophils Absolute: 0.1 10*3/uL (ref 0.0–0.1)
Basophils Relative: 1.1 % (ref 0.0–3.0)
Eosinophils Absolute: 0.2 10*3/uL (ref 0.0–0.7)
Eosinophils Relative: 3.1 % (ref 0.0–5.0)
HCT: 48.3 % (ref 39.0–52.0)
Hemoglobin: 16.6 g/dL (ref 13.0–17.0)
Lymphocytes Relative: 20.4 % (ref 12.0–46.0)
Lymphs Abs: 1.1 10*3/uL (ref 0.7–4.0)
MCHC: 34.4 g/dL (ref 30.0–36.0)
MCV: 93.8 fl (ref 78.0–100.0)
Monocytes Absolute: 0.4 10*3/uL (ref 0.1–1.0)
Monocytes Relative: 8.2 % (ref 3.0–12.0)
Neutro Abs: 3.7 10*3/uL (ref 1.4–7.7)
Neutrophils Relative %: 67.2 % (ref 43.0–77.0)
Platelets: 106 10*3/uL — ABNORMAL LOW (ref 150.0–400.0)
RBC: 5.15 Mil/uL (ref 4.22–5.81)
RDW: 14.1 % (ref 11.5–14.6)
WBC: 5.5 10*3/uL (ref 4.5–10.5)

## 2010-11-17 LAB — HEPATIC FUNCTION PANEL
ALT: 20 U/L (ref 0–53)
AST: 24 U/L (ref 0–37)
Albumin: 4.1 g/dL (ref 3.5–5.2)
Alkaline Phosphatase: 47 U/L (ref 39–117)
Bilirubin, Direct: 0.2 mg/dL (ref 0.0–0.3)
Total Bilirubin: 1.1 mg/dL (ref 0.3–1.2)
Total Protein: 6.3 g/dL (ref 6.0–8.3)

## 2010-11-17 LAB — BASIC METABOLIC PANEL
BUN: 18 mg/dL (ref 6–23)
CO2: 31 mEq/L (ref 19–32)
Calcium: 9 mg/dL (ref 8.4–10.5)
Chloride: 105 mEq/L (ref 96–112)
Creatinine, Ser: 1.9 mg/dL — ABNORMAL HIGH (ref 0.4–1.5)
GFR: 37.99 mL/min — ABNORMAL LOW (ref >60.00)
Glucose, Bld: 83 mg/dL (ref 70–99)
Potassium: 5.2 mEq/L — ABNORMAL HIGH (ref 3.5–5.1)
Sodium: 142 mEq/L (ref 135–145)

## 2010-11-17 LAB — PSA: PSA: 2 ng/mL (ref 0.10–4.00)

## 2010-11-24 ENCOUNTER — Ambulatory Visit
Admission: RE | Admit: 2010-11-24 | Discharge: 2010-11-24 | Payer: Self-pay | Source: Home / Self Care | Attending: Internal Medicine | Admitting: Internal Medicine

## 2010-11-30 NOTE — Letter (Signed)
Summary: Endoscopy Letter  [facility]  [place], [place] [address]   Phone: [phone]  Fax: [phone]      July 27, 2010 MRN: [id number]   Scott Oneal [address] [place], [place]  [address]   Dear Mr. Murchison,   According to your medical record, it is time for you to schedule an Endoscopy. Endoscopic screening is recommended for patients with certain upper digestive tract conditions because of associated increased risk for cancers of the upper digestive system.  This letter has been generated based on the recommendations made at the time of your prior procedure. If you feel that in your particular situation this may no longer apply, please contact our office.  Please call our office at [phone]) to schedule this appointment or to update your records at your earliest convenience.  Thank you for cooperating with [place] to provide you with the very best care possible.   Sincerely,  Norberto Sorenson T. Fuller Plan, M.D.  [facility] [phone]

## 2010-11-30 NOTE — Consult Note (Signed)
Summary: [facility]   Imported By: Phillis Knack 05/05/2010 12:16:25  _____________________________________________________________________  External Attachment:    Type:   Image     Comment:   External Document

## 2010-11-30 NOTE — Miscellaneous (Signed)
Summary: Orders Update  Clinical Lists Changes  Problems: Added new problem of CAROTID ARTERY DISEASE (ICD-433.10) Orders: Added new Test order of Carotid Duplex (Carotid Duplex) - Signed

## 2010-11-30 NOTE — Letter (Signed)
Summary: [facility]   Imported By: Phillis Knack 01/20/2010 07:50:07  _____________________________________________________________________  External Attachment:    Type:   Image     Comment:   External Document

## 2010-11-30 NOTE — Assessment & Plan Note (Signed)
Summary: 4 MO ROV /NWS  #   Vital Signs:  Patient profile:   70 year old male Height:      72 inches Weight:      202.75 pounds BMI:     27.60 O2 Sat:      95 % on Room air Temp:     98.3 degrees F oral Pulse rate:   62 / minute BP sitting:   134 / 70  (left arm) Cuff size:   regular  Vitals Entered By: Ernestene Mention (Mar 10, 2010 10:11 AM)  O2 Flow:  Room air CC: 4 mo rtn ov/follow up./kb Is Patient Diabetic? No Pain Assessment Patient in pain? no        Primary Care Provider:  Cristie Hem Hadden Steig,M.D  CC:  4 mo rtn ov/follow up./kb.  History of Present Illness: The patient presents for a follow up of hypertension, hypothyroidsm, hypogonadism, prostate issues.   Current Medications (verified): 1)  Plavix 75 Mg  Tabs (Clopidogrel Bisulfate) .... Once Daily 2)  Viagra 100 Mg  Tabs (Sildenafil Citrate) .Marland Kitchen.. 1 Po As Needed 3)  Levothyroxine Sodium 75 Mcg Tabs (Levothyroxine Sodium) .... Take 1 Tablet By Mouth Once A Day 4)  Multivitamins   Tabs (Multiple Vitamin) .... Once Daily 5)  Vitamin D 1000 Unit  Tabs (Cholecalciferol) .... Once Daily 6)  Nitroglycerin 0.4 Mg Subl (Nitroglycerin) .... Place 1 Tablet Under Tongue As Directed 7)  Adult Aspirin Low Strength 81 Mg  Tbdp (Aspirin) .... Once Daily 8)  Fish Oil   Oil (Fish Oil) .... Once Daily 9)  Testosterone Cypionate 200 Mg/ml Oil (Testosterone Cypionate) .... 400 Mg Im Q 2 Wks 10)  Bd Integra Syringe 23g X 1" 3 Ml Misc (Syringe/needle (Disp)) .... Use As Directed Every 2 Wks (Ok Any Brand, Please Use 23g 1 Inch) 11)  Simvastatin 10 Mg Tabs (Simvastatin) .Marland Kitchen.. 1 By Mouth At Bedtime 12)  Xalatan 0.005 % Soln (Latanoprost) .... One Drop in Each Eye At Night 13)  Prednisone 10 Mg Tabs (Prednisone) .... Take 32m Qd For 1 Days, Then 20 Mg Qd For 3 Days, Then 133mQd For 6 Days, Then Stop. Take Pc. 14)  Hydrocodone-Acetaminophen 5-325 Mg Tabs (Hydrocodone-Acetaminophen) ...Marland Kitchen 1-2 By Mouth Two Times A Day As Needed Pain  Allergies  (verified): 1)  ! Neosporin Lt (Allantoin-Pramoxine) 2)  Metoprolol Tartrate (Metoprolol Tartrate)  Past History:  Social History: Last updated: 03/10/2010 Retired Married Alcohol use-no Regular exercise- yoga and silver sneakers Never Smoked Daily Caffeine Use Patient gets regular exercise.  Past Medical History: Current Problems:  CORONARY ARTERY DISEASE (ICD-414.00) MYOCARDIAL INFARCTION, HX OF (ICD-412) HYPERTENSION (ICD-401.9) HYPERCHOLESTEROLEMIA (ICD-272.0) ANTICOAGULATION THERAPY (ICD-V58.61) BRADYCARDIA (ICD-427.89) GERD (ICD-530.81) FATIGUE (ICD-780.79) HYPOGONADISM, MALE (ICD-257.2) PERSONAL HX COLONIC POLYPS (ICD-V12.72) OSTEOPOROSIS (ICD-733.00) CHOLELITHIASIS (ICD-574.2) CALCU GALLBLADD W/O MENTION CHOLECYST/OBST (ICD-574.20) CELIAC DISEASE (ICD-579.0) WEIGHT LOSS (ICD-783.21) ASTHMA (ICD-493.90) DIARRHEA (ICD-787.91) HYPOTHYROIDISM (ICD-244.9) GOUT (ICD-274.9) TUBULOVILLOUS ADENOMA, COLON (ICD-211.3) Renal insufficiency  Social History: Retired Married Alcohol use-no Regular exercise- yoga and silver sneakers Never Smoked Daily Caffeine Use Patient gets regular exercise.  Review of Systems  The patient denies fever, peripheral edema, hemoptysis, abdominal pain, and melena.    Physical Exam  General:  NAD Nose:  External nasal examination shows no deformity or inflammation. Nasal mucosa are pink and moist without lesions or exudates. Mouth:  Oral mucosa and oropharynx without lesions or exudates.  Teeth in good repair. Lungs:  Normal respiratory effort, chest expands symmetrically. Lungs are clear to auscultation, no crackles or  wheezes. Heart:  Normal rate and regular rhythm. S1 and S2 normal without gallop, murmur, click, rub or other extra sounds. Abdomen:  Bowel sounds positive,abdomen soft and non-tender without masses, organomegaly or hernias noted. Msk:  No deformity or scoliosis noted of thoracic or lumbar spine.   Neurologic:  No  cranial nerve deficits noted. Station and gait are normal. Plantar reflexes are down-going bilaterally. DTRs are symmetrical throughout. Sensory, motor and coordinative functions appear intact. Skin:  Intact without suspicious lesions or rashes Psych:  Cognition and judgment appear intact. Alert and cooperative with normal attention span and concentration. No apparent delusions, illusions, hallucinations   Impression & Recommendations:  Problem # 1:  PSA, INCREASED (ICD-790.93) Assessment Deteriorated S/p Urol eval Appt is pending w/Dr Luberta Robertson in August Use testost q 3 wks  Problem # 2:  RENAL INSUFFICIENCY (ICD-588.9) Assessment: Deteriorated  Abd [place] Nephrol consult  Orders: Nephrology Referral (Nephro) Radiology Referral (Radiology)  Problem # 3:  HYPOGONADISM, MALE (ICD-257.2) Assessment: Unchanged The labs were reviewed with the patient.   As per #1  Problem # 4:  CORONARY ARTERY DISEASE (ICD-414.00) Assessment: Unchanged  His updated medication list for this problem includes:    Plavix 75 Mg Tabs (Clopidogrel bisulfate) ..... Once daily    Nitroglycerin 0.4 Mg Subl (Nitroglycerin) .Marland Kitchen... Place 1 tablet under tongue as directed    Adult Aspirin Low Strength 81 Mg Tbdp (Aspirin) ..... Once daily The labs were reviewed with the patient.   Complete Medication List: 1)  Plavix 75 Mg Tabs (Clopidogrel bisulfate) .... Once daily 2)  Viagra 100 Mg Tabs (Sildenafil citrate) .Marland Kitchen.. 1 po as needed 3)  Levothyroxine Sodium 75 Mcg Tabs (Levothyroxine sodium) .... Take 1 tablet by mouth once a day 4)  Multivitamins Tabs (Multiple vitamin) .... Once daily 5)  Vitamin D 1000 Unit Tabs (Cholecalciferol) .... Once daily 6)  Nitroglycerin 0.4 Mg Subl (Nitroglycerin) .... Place 1 tablet under tongue as directed 7)  Adult Aspirin Low Strength 81 Mg Tbdp (Aspirin) .... Once daily 8)  Fish Oil Oil (Fish oil) .... Once daily 9)  Testosterone Cypionate 200 Mg/ml Oil (Testosterone cypionate)  .... 400 mg im q 2 wks 10)  Bd Integra Syringe 23g X 1" 3 Ml Misc (Syringe/needle (disp)) .... Use as directed every 2 wks (ok any brand, please use 23g 1 inch) 11)  Simvastatin 10 Mg Tabs (Simvastatin) .Marland Kitchen.. 1 by mouth at bedtime 12)  Xalatan 0.005 % Soln (Latanoprost) .... One drop in each eye at night 13)  Prednisone 10 Mg Tabs (Prednisone) .... Take 2m qd for 1 days, then 20 mg qd for 3 days, then 143mqd for 6 days, then stop. take pc. 14)  Hydrocodone-acetaminophen 5-325 Mg Tabs (Hydrocodone-acetaminophen) ...Marland Kitchen 1-2 by mouth two times a day as needed pain  Patient Instructions: 1)  Please schedule a follow-up appointment in 3 months. 2)  BMP prior to visit, ICD-9: 3)  CBC w/ Diff prior to visit, ICD-9: 4)  PSA prior to visit, ICD-9: Prescriptions: PREDNISONE 10 MG TABS (PREDNISONE) Take 4064md for 1 days, then 20 mg qd for 3 days, then 68m61m for 6 days, then stop. Take pc.  #21 x 1   Entered and Authorized by:   AlekCassandria Anger  Signed by:   AlekCassandria Angeron 03/10/2010   Method used:   Electronically to        [facility]       [address]  [place], [place]  [address]       Ph: [phone]       Fax: [phone]   RxID:   [id number]

## 2010-11-30 NOTE — Progress Notes (Signed)
Summary: ELEVATED BP   Phone Note Other Incoming   Caller: PT Summary of Call: pt calling to see if he should go back on his BP medications BP has been running 190 to low 100's. What should pt do? Initial call taken by: Chauvin,  April 06, 2010 3:14 PM  Follow-up for Phone Call        Yes pls OV if BP remains elevated Follow-up by: Cassandria Anger MD,  April 07, 2010 8:04 AM  Additional Follow-up for Phone Call Additional follow up Details #1::        Pt's bp readings were 140's over 90's yesterday w/pulse in mid-60's. He will restart toprol xl 94m 1/2 once daily and continue to check BP & HR on regular basis. Per pt, resting HR is low 60's.  Additional Follow-up by: SCharlsie Quest CMA,  April 07, 2010 8:39 AM    New/Updated Medications: TOPROL XL 25 MG XR24H-TAB (METOPROLOL SUCCINATE) 1/2 once daily

## 2010-11-30 NOTE — Assessment & Plan Note (Signed)
Summary: PER PT FU--STC   Vital Signs:  Patient profile:   70 year old male Height:      72 inches Weight:      206 pounds BMI:     28.04 O2 Sat:      97 % on Room air Temp:     98.8 degrees F oral Pulse rate:   74 / minute Pulse rhythm:   regular Resp:     16 per minute BP sitting:   156 / 60  (left arm) Cuff size:   regular  Vitals Entered By: Jonathon Resides, CMA(AAMA) (July 07, 2010 9:05 AM)  O2 Flow:  Room air CC: f/u of hypertension Is Patient Diabetic? No   Primary Care Provider:  Cristie Hem Joandry Slagter,M.D  CC:  f/u of hypertension.  History of Present Illness: C/o neck pain L The patient presents for a follow up of hypertension, celiacia, CRI   Current Medications (verified): 1)  Plavix 75 Mg  Tabs (Clopidogrel Bisulfate) .... Once Daily 2)  Viagra 100 Mg  Tabs (Sildenafil Citrate) .Marland Kitchen.. 1 Po As Needed 3)  Levothyroxine Sodium 75 Mcg Tabs (Levothyroxine Sodium) .... Take 1 Tablet By Mouth Once A Day 4)  Multivitamins   Tabs (Multiple Vitamin) .... Once Daily 5)  Vitamin D 1000 Unit  Tabs (Cholecalciferol) .... Once Daily 6)  Nitroglycerin 0.4 Mg Subl (Nitroglycerin) .... Place 1 Tablet Under Tongue As Directed 7)  Adult Aspirin Low Strength 81 Mg  Tbdp (Aspirin) .... Once Daily 8)  Fish Oil   Oil (Fish Oil) .... Once Daily 9)  Testosterone Cypionate 200 Mg/ml Oil (Testosterone Cypionate) .... 400 Mg Im Q 2 Wks 10)  Bd Integra Syringe 23g X 1" 3 Ml Misc (Syringe/needle (Disp)) .... Use As Directed Every 2 Wks (Ok Any Brand, Please Use 23g 1 Inch) 11)  Simvastatin 10 Mg Tabs (Simvastatin) .Marland Kitchen.. 1 By Mouth At Bedtime 12)  Xalatan 0.005 % Soln (Latanoprost) .... One Drop in Each Eye At Night 13)  Prednisone 10 Mg Tabs (Prednisone) .... Take 72m Qd For 1 Days, Then 20 Mg Qd For 3 Days, Then 126mQd For 6 Days, Then Stop. Take Pc. 14)  Hydrocodone-Acetaminophen 5-325 Mg Tabs (Hydrocodone-Acetaminophen) ...Marland Kitchen 1-2 By Mouth Two Times A Day As Needed Pain 15)  Toprol Xl 25 Mg  Xr24h-Tab (Metoprolol Succinate) .... 1/2 Once Daily  Allergies (verified): 1)  ! Neosporin Lt (Allantoin-Pramoxine) 2)  Metoprolol Tartrate (Metoprolol Tartrate)  Past History:  Social History: Last updated: 03/10/2010 Retired Married Alcohol use-no Regular exercise- yoga and silver sneakers Never Smoked Daily Caffeine Use Patient gets regular exercise.  Past Medical History: Current Problems:  CORONARY ARTERY DISEASE (ICD-414.00) MYOCARDIAL INFARCTION, HX OF (ICD-412) HYPERTENSION (ICD-401.9) HYPERCHOLESTEROLEMIA (ICD-272.0) ANTICOAGULATION THERAPY (ICD-V58.61) BRADYCARDIA (ICD-427.89) GERD (ICD-530.81) FATIGUE (ICD-780.79) HYPOGONADISM, MALE (ICD-257.2) PERSONAL HX COLONIC POLYPS (ICD-V12.72) OSTEOPOROSIS (ICD-733.00) CHOLELITHIASIS (ICD-574.2) CALCU GALLBLADD W/O MENTION CHOLECYST/OBST (ICD-574.20) CELIAC DISEASE (ICD-579.0) WEIGHT LOSS (ICD-783.21) ASTHMA (ICD-493.90) DIARRHEA (ICD-787.91) HYPOTHYROIDISM (ICD-244.9) GOUT (ICD-274.9) TUBULOVILLOUS ADENOMA, COLON (ICD-211.3) Renal insufficiency Elev PTH due to CRI and Celiac dz 2011  Review of Systems  The patient denies fever, dyspnea on exertion, abdominal pain, and melena.    Physical Exam  General:  NAD Head:  Normocephalic and atraumatic without obvious abnormalities. No apparent alopecia or balding. Eyes:  R upper eyelid chalazion Nose:  External nasal examination shows no deformity or inflammation. Nasal mucosa are pink and moist without lesions or exudates. Mouth:  Oral mucosa and oropharynx without lesions or exudates.  Teeth in  good repair. Neck:  No deformities, masses, or tenderness noted. Lungs:  Normal respiratory effort, chest expands symmetrically. Lungs are clear to auscultation, no crackles or wheezes. Heart:  Normal rate and regular rhythm. S1 and S2 normal without gallop, murmur, click, rub or other extra sounds. Abdomen:  Bowel sounds positive,abdomen soft and non-tender without masses,  organomegaly or hernias noted. Msk:  No deformity or scoliosis noted of thoracic or lumbar spine.   Neurologic:  No cranial nerve deficits noted. Station and gait are normal. Plantar reflexes are down-going bilaterally. DTRs are symmetrical throughout. Sensory, motor and coordinative functions appear intact. Skin:  Intact without suspicious lesions or rashes Psych:  Cognition and judgment appear intact. Alert and cooperative with normal attention span and concentration. No apparent delusions, illusions, hallucinations   Impression & Recommendations:  Problem # 1:  RENAL INSUFFICIENCY (ICD-588.9) Assessment Unchanged The labs were reviewed with the patient. Nephr. f/u is pending next wk  Problem # 2:  HYPERTENSION (ICD-401.9) Assessment: Deteriorated  The following medications were removed from the medication list:    Toprol Xl 25 Mg Xr24h-tab (Metoprolol succinate) .Marland Kitchen... 1/2 once daily His updated medication list for this problem includes:    Coreg 25 Mg Tabs (Carvedilol) .Marland Kitchen... 1 by mouth two times a day for blood pressure  Problem # 3:  PSA, INCREASED (ICD-790.93) F/u with Dr Diona Fanti  Problem # 4:  CORONARY ARTERY DISEASE (ICD-414.00) Assessment: Unchanged  The following medications were removed from the medication list:    Toprol Xl 25 Mg Xr24h-tab (Metoprolol succinate) .Marland Kitchen... 1/2 once daily His updated medication list for this problem includes:    Plavix 75 Mg Tabs (Clopidogrel bisulfate) ..... Once daily    Nitroglycerin 0.4 Mg Subl (Nitroglycerin) .Marland Kitchen... Place 1 tablet under tongue as directed    Adult Aspirin Low Strength 81 Mg Tbdp (Aspirin) ..... Once daily    Coreg 25 Mg Tabs (Carvedilol) .Marland Kitchen... 1 by mouth two times a day for blood pressure  Problem # 5:  CHALAZION (ICD-373.2) R Assessment: New antibiotic  F/u w/Ophth  Problem # 6:  HYPOTHYROIDISM (ICD-244.9) Assessment: Unchanged  His updated medication list for this problem includes:    Levothyroxine Sodium 75  Mcg Tabs (Levothyroxine sodium) .Marland Kitchen... Take 1 tablet by mouth once a day  Problem # 7:  Elev. PTH due to malabsorbtion and CRI Assessment: New D/c Vit D Start Rocaltrol  Complete Medication List: 1)  Plavix 75 Mg Tabs (Clopidogrel bisulfate) .... Once daily 2)  Viagra 100 Mg Tabs (Sildenafil citrate) .Marland Kitchen.. 1 po as needed 3)  Levothyroxine Sodium 75 Mcg Tabs (Levothyroxine sodium) .... Take 1 tablet by mouth once a day 4)  Multivitamins Tabs (Multiple vitamin) .... Once daily 5)  Nitroglycerin 0.4 Mg Subl (Nitroglycerin) .... Place 1 tablet under tongue as directed 6)  Adult Aspirin Low Strength 81 Mg Tbdp (Aspirin) .... Once daily 7)  Fish Oil Oil (Fish oil) .... Once daily 8)  Testosterone Cypionate 200 Mg/ml Oil (Testosterone cypionate) .... 400 mg im q 2 wks 9)  Bd Integra Syringe 23g X 1" 3 Ml Misc (Syringe/needle (disp)) .... Use as directed every 2 wks (ok any brand, please use 23g 1 inch) 10)  Simvastatin 10 Mg Tabs (Simvastatin) .Marland Kitchen.. 1 by mouth at bedtime 11)  Xalatan 0.005 % Soln (Latanoprost) .... One drop in each eye at night 12)  Prednisone 10 Mg Tabs (Prednisone) .... Take 81m qd for 1 days, then 20 mg qd for 3 days, then 137mqd for 6 days,  then stop. take pc. 13)  Hydrocodone-acetaminophen 5-325 Mg Tabs (Hydrocodone-acetaminophen) .Marland Kitchen.. 1-2 by mouth two times a day as needed pain 14)  Coreg 25 Mg Tabs (Carvedilol) .Marland Kitchen.. 1 by mouth two times a day for blood pressure 15)  Pennsaid 1.5 % Soln (Diclofenac sodium) .... 3-5 gtt on skin three times a day for pain 16)  Doxycycline Hyclate 100 Mg Caps (Doxycycline hyclate) .Marland Kitchen.. 1 by mouth two times a day with a glass of water 17)  Rocaltrol 0.25 Mcg Caps (Calcitriol) .Marland Kitchen.. 1 by mouth qd  Other Orders: Flu Vaccine 26yr + ((37902 Administration Flu vaccine - MCR ((I0973  Patient Instructions: 1)  Please schedule a follow-up appointment in 4 months well w/labs. Prescriptions: ROCALTROL 0.25 MCG CAPS (CALCITRIOL) 1 by mouth qd  #90 x  3   Entered and Authorized by:   ACassandria AngerMD   Signed by:   ACassandria AngerMD on 07/08/2010   Method used:   Print then Give to Patient   RxID:   [id number] HYCLATE 100 MG CAPS (DOXYCYCLINE HYCLATE) 1 by mouth two times a day with a glass of water  #20 x 0   Entered and Authorized by:   ACassandria AngerMD   Signed by:   ACassandria AngerMD on 07/07/2010   Method used:   Print then Give to Patient   RxID:   [id number] 1.5 % SOLN (DICLOFENAC SODIUM) 3-5 gtt on skin three times a day for pain  #1 x 3   Entered and Authorized by:   ACassandria AngerMD   Signed by:   ACassandria AngerMD on 07/07/2010   Method used:   Print then Give to Patient   RxID:   [id number] 25 MG TABS (CARVEDILOL) 1 by mouth two times a day for blood pressure  #180 x 3   Entered and Authorized by:   ACassandria AngerMD   Signed by:   ACassandria AngerMD on 07/07/2010   Method used:   Print then Give to Patient   RxID:   [id number] 25 MG TABS (CARVEDILOL) 1 by mouth two times a day for blood pressure  #60 x 12   Entered and Authorized by:   ACassandria AngerMD   Signed by:   ACassandria AngerMD on 07/07/2010   Method used:   Print then Give to Patient   RxID::   [id number]  Flu Vaccine Consent Questions     Do you have a history of severe allergic reactions to this vaccine? no    Any prior history of allergic reactions to egg and/or gelatin? no    Do you have a sensitivity to the preservative Thimersol? no    Do you have a past history of Guillan-Barre Syndrome? no    Do you currently have an acute febrile illness? no    Have you ever had a severe reaction to latex? no    Vaccine information given and explained to patient? yes    Are you currently pregnant? no    Lot Number:[id number]   Exp Date:04/29/2010   Site Given  [facility]  July 07, 2010 9:42 AM

## 2010-11-30 NOTE — Letter (Signed)
Summary: [facility]   Imported By: Rise Patience 07/23/2010 14:48:36  _____________________________________________________________________  External Attachment:    Type:   Image     Comment:   External Document

## 2010-11-30 NOTE — Progress Notes (Signed)
  Phone Note Refill Request Call back at Home Phone [phone] Message from:  Patient on April 21, 2010 9:28 AM  Refills Requested: Medication #1:  PLAVIX 75 MG  TABS once daily   Dosage confirmed as above?Dosage Confirmed   Supply Requested: 3 months  Medication #2:  LEVOTHYROXINE SODIUM 75 MCG TABS Take 1 tablet by mouth once a day   Dosage confirmed as above?Dosage Confirmed   Supply Requested: 3 months  Medication #3:  SIMVASTATIN 10 MG TABS 1 by mouth at bedtime   Dosage confirmed as above?Dosage Confirmed   Supply Requested: 3 months  Pt request rx solutions   Method Requested: Fax to [facility] Initial call taken by: Estell Harpin CMA,  April 21, 2010 9:28 AM  Follow-up for Phone Call        Patient notified.Marland KitchenMarland KitchenEllison Hughs Archie CMA  April 21, 2010 9:37 AM     Prescriptions: SIMVASTATIN 10 MG TABS (SIMVASTATIN) 1 by mouth at bedtime  #90 x 3   Entered by:   Estell Harpin CMA   Authorized by:   Cassandria Anger MD   Signed by:   Estell Harpin CMA on 04/21/2010   Method used:   Faxed to ...       [facility]             , [place]         Ph:        Fax: [phone]   RxID:   [id number] LEVOTHYROXINE SODIUM 75 MCG TABS (LEVOTHYROXINE SODIUM) Take 1 tablet by mouth once a day  #90 x 3   Entered by:   Estell Harpin CMA   Authorized by:   Cassandria Anger MD   Signed by:   Estell Harpin CMA on 04/21/2010   Method used:   Faxed to ...       [facility]mail[facility]order)             , [place]         Ph:        Fax: [phone]   RxID:   [id number] PLAVIX 64 MG  TABS (CLOPIDOGREL BISULFATE) once daily  #90 x 3   Entered by:   Estell Harpin CMA   Authorized by:   Cassandria Anger MD   Signed by:   Estell Harpin CMA on 04/21/2010   Method used:   Faxed to ...       [facility]order)             , [place]         Ph:        Fax:  [phone]   RxID:   [id number]

## 2010-11-30 NOTE — Progress Notes (Signed)
  Phone Note From Pharmacy   Caller: [address] Summary of Call: Pharm is req clarification on prednisone. QTY is 21 but directions are only for 16. Initial call taken by: Charlsie Quest, [place],  Mar 10, 2010 1:27 PM  Follow-up for Phone Call        sorry - ok 16 Follow-up by: Cassandria Anger MD,  Mar 10, 2010 5:23 PM    Prescriptions: PREDNISONE 10 MG TABS (PREDNISONE) Take 34m qd for 1 days, then 20 mg qd for 3 days, then 119mqd for 6 days, then stop. Take pc.  #16 x 1   Entered by:   SaCharlsie QuestCMA   Authorized by:   AlCassandria AngerD   Signed by:   SaCharlsie QuestCMA on 03/10/2010   Method used:   Electronically to        [facility] (retail)       [address]     [place]  [id number]     Ph: [phone]     Fax: [phone] RxID:   [id number]

## 2010-11-30 NOTE — Assessment & Plan Note (Signed)
Summary: CPX / $50 / CD   Vital Signs:  Patient profile:   70 year old male Weight:      202 pounds Temp:     98.4 degrees F oral Pulse rate:   75 / minute BP sitting:   126 / 82  (left arm)  Vitals Entered By: Doralee Albino (November 09, 2009 10:43 AM) CC: cpx   Primary Care Provider:  Cristie Hem Plotnikov,M.D  CC:  cpx.  History of Present Illness: The patient presents for a wellness examination   Preventive Screening-Counseling & Management  Alcohol-Tobacco     Smoking Status: never  Current Medications (verified): 1)  Plavix 75 Mg  Tabs (Clopidogrel Bisulfate) .... Once Daily 2)  Viagra 100 Mg  Tabs (Sildenafil Citrate) .Marland Kitchen.. 1 Po As Needed 3)  Levothyroxine Sodium 75 Mcg Tabs (Levothyroxine Sodium) .... Take 1 Tablet By Mouth Once A Day 4)  Multivitamins   Tabs (Multiple Vitamin) .... Once Daily 5)  Vitamin D 1000 Unit  Tabs (Cholecalciferol) .... Once Daily 6)  Nitroglycerin 0.4 Mg Subl (Nitroglycerin) .... Place 1 Tablet Under Tongue As Directed 7)  Adult Aspirin Low Strength 81 Mg  Tbdp (Aspirin) .... Once Daily 8)  Fish Oil   Oil (Fish Oil) .... Once Daily 9)  Testosterone Cypionate 200 Mg/ml Oil (Testosterone Cypionate) .... 400 Mg Im Q 2 Wks 10)  Bd Integra Syringe 23g X 1" 3 Ml Misc (Syringe/needle (Disp)) .... Use As Directed Every 2 Wks (Ok Any Brand, Please Use 23g 1 Inch) 11)  Simvastatin 10 Mg Tabs (Simvastatin) .Marland Kitchen.. 1 By Mouth At Bedtime 12)  Xalatan 0.005 % Soln (Latanoprost) .... One Drop in Each Eye At Night  Allergies: 1)  ! Neosporin Lt 2)  Metoprolol Tartrate (Metoprolol Tartrate)  Past History:  Past Medical History: Last updated: 08/31/2009 Current Problems:  CORONARY ARTERY DISEASE (ICD-414.00) MYOCARDIAL INFARCTION, HX OF (ICD-412) HYPERTENSION (ICD-401.9) HYPERCHOLESTEROLEMIA (ICD-272.0) ANTICOAGULATION THERAPY (ICD-V58.61) BRADYCARDIA (ICD-427.89) GERD (ICD-530.81) FATIGUE (ICD-780.79) HYPOGONADISM, MALE (ICD-257.2) PERSONAL HX COLONIC  POLYPS (ICD-V12.72) OSTEOPOROSIS (ICD-733.00) CHOLELITHIASIS (ICD-574.2) CALCU GALLBLADD W/O MENTION CHOLECYST/OBST (ICD-574.20) CELIAC DISEASE (ICD-579.0) WEIGHT LOSS (ICD-783.21) ASTHMA (ICD-493.90) DIARRHEA (ICD-787.91) HYPOTHYROIDISM (ICD-244.9) GOUT (ICD-274.9) TUBULOVILLOUS ADENOMA, COLON (ICD-211.3)  Past Surgical History: Last updated: 08/31/2009  Rt. Knee surgery  Cystoscopy-1998  stenting of the lesion in the distal circumflex artery with a Cypher stent-2005 overlapping stents in RCA--Cypher.  Family History: Last updated: 08/31/2009 Family History Hypertension No asthma No FH of Colon Cancer Lymphoma-father  Social History: Last updated: 08/31/2009 Retired Married Alcohol use-no Regular exercise-no Never Smoked Daily Caffeine Use Patient gets regular exercise.  Review of Systems  The patient denies anorexia, fever, weight loss, weight gain, vision loss, decreased hearing, hoarseness, chest pain, syncope, dyspnea on exertion, peripheral edema, prolonged cough, headaches, hemoptysis, abdominal pain, melena, hematochezia, severe indigestion/heartburn, hematuria, incontinence, genital sores, muscle weakness, suspicious skin lesions, transient blindness, difficulty walking, depression, unusual weight change, abnormal bleeding, enlarged lymph nodes, angioedema, and testicular masses.    Physical Exam  General:  NAD Head:  Normocephalic and atraumatic without obvious abnormalities. No apparent alopecia or balding. Eyes:  No corneal or conjunctival inflammation noted. EOMI. Perrla.  Ears:  External ear exam shows no significant lesions or deformities.  Otoscopic examination reveals clear canals, tympanic membranes are intact bilaterally without bulging, retraction, inflammation or discharge. Hearing is grossly normal bilaterally. Nose:  External nasal examination shows no deformity or inflammation. Nasal mucosa are pink and moist without lesions or exudates. Mouth:   Oral mucosa and oropharynx without  lesions or exudates.  Teeth in good repair. Neck:  No deformities, masses, or tenderness noted. Lungs:  Normal respiratory effort, chest expands symmetrically. Lungs are clear to auscultation, no crackles or wheezes. Heart:  Normal rate and regular rhythm. S1 and S2 normal without gallop, murmur, click, rub or other extra sounds. Abdomen:  Bowel sounds positive,abdomen soft and non-tender without masses, organomegaly or hernias noted. Msk:  No deformity or scoliosis noted of thoracic or lumbar spine.   Extremities:  No clubbing, cyanosis, edema, or deformity noted with normal full range of motion of all joints.   Neurologic:  No cranial nerve deficits noted. Station and gait are normal. Plantar reflexes are down-going bilaterally. DTRs are symmetrical throughout. Sensory, motor and coordinative functions appear intact. Skin:  Intact without suspicious lesions or rashes Inguinal Nodes:  No significant adenopathy Psych:  Cognition and judgment appear intact. Alert and cooperative with normal attention span and concentration. No apparent delusions, illusions, hallucinations   Impression & Recommendations:  Problem # 1:  PHYSICAL EXAMINATION (ICD-V70.0) Assessment Comment Only Health and age related issues were discussed. Available screening tests and vaccinations were discussed as well. Healthy life style including good diet and execise was discussed. The labs were reviewed with the patient.  Problem # 2:  CORONARY ARTERY DISEASE (ICD-414.00) Assessment: New  His updated medication list for this problem includes:    Plavix 75 Mg Tabs (Clopidogrel bisulfate) ..... Once daily    Nitroglycerin 0.4 Mg Subl (Nitroglycerin) .Marland Kitchen... Place 1 tablet under tongue as directed    Adult Aspirin Low Strength 81 Mg Tbdp (Aspirin) ..... Once daily  Problem # 3:  GERD (ICD-530.81) Assessment: Improved  The following medications were removed from the medication list:     Pantoprazole Sodium 40 Mg Tbec (Pantoprazole sodium) .Marland Kitchen... Take one tablet by mouth 30 minutes prior to breakfast  Problem # 4:  HYPOTHYROIDISM (ICD-244.9) Assessment: Improved  His updated medication list for this problem includes:    Levothyroxine Sodium 75 Mcg Tabs (Levothyroxine sodium) .Marland Kitchen... 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 prn  Problem # 7:  PSA >2.0 Assessment: New Urol consult  Complete Medication List: 1)  Plavix 75 Mg Tabs (Clopidogrel bisulfate) .... Once daily 2)  Viagra 100 Mg Tabs (Sildenafil citrate) .Marland Kitchen.. 1 po as needed 3)  Levothyroxine Sodium 75 Mcg Tabs (Levothyroxine sodium) .... Take 1 tablet by mouth once a day 4)  Multivitamins Tabs (Multiple vitamin) .... Once daily 5)  Vitamin D 1000 Unit Tabs (Cholecalciferol) .... Once daily 6)  Nitroglycerin 0.4 Mg Subl (Nitroglycerin) .... Place 1 tablet under tongue as directed 7)  Adult Aspirin Low Strength 81 Mg Tbdp (Aspirin) .... Once daily 8)  Fish Oil Oil (Fish oil) .... Once daily 9)  Testosterone Cypionate 200 Mg/ml Oil (Testosterone cypionate) .... 400 mg im q 2 wks 10)  Bd Integra Syringe 23g X 1" 3 Ml Misc (Syringe/needle (disp)) .... Use as directed every 2 wks (ok any brand, please use 23g 1 inch) 11)  Simvastatin 10 Mg Tabs (Simvastatin) .Marland Kitchen.. 1 by mouth at bedtime 12)  Xalatan 0.005 % Soln (Latanoprost) .... One drop in each eye at night 13)  Prednisone 10 Mg Tabs (Prednisone) .... Take 89m qd for 1 days, then 20 mg qd for 3 days, then 16mqd for 6 days,  then stop. take pc. 14)  Hydrocodone-acetaminophen 5-325 Mg Tabs (Hydrocodone-acetaminophen) .Marland Kitchen.. 1-2 by mouth two times a day as needed pain  Other Orders: Urology Referral (Urology)  Patient Instructions: 1)  Please schedule a follow-up appointment in 4 months. 2)  BMP prior to  visit, ICD-9: 3)  Hepatic Panel prior to visit, ICD-9: 4)  CBC w/ Diff prior to visit, ICD-9: 5)  Urine-dip prior to visit, ICD-9: 6)  PSA prior to visit, ICD-9: 7)  testost  995.20    Prescriptions: HYDROCODONE-ACETAMINOPHEN 5-325 MG TABS (HYDROCODONE-ACETAMINOPHEN) 1-2 by mouth two times a day as needed pain  #60 x 1   Entered and Authorized by:   Cassandria Anger MD   Signed by:   Cassandria Anger MD on 11/09/2009   Method used:   Print then Give to Patient   RxID:   [id number] PREDNISONE 10 MG TABS (PREDNISONE) Take 2m qd for 1 days, then 20 mg qd for 3 days, then 18mqd for 6 days, then stop. Take pc.  #21 x 1   Entered and Authorized by:   AlCassandria AngerD   Signed by:   AlCassandria AngerD on 11/09/2009   Method used:   Electronically to        [facility] [address] (retail)       [address]     [place]  [id number]     Ph: [phone]     Fax: [phone] RxID:   [id number]

## 2010-11-30 NOTE — Progress Notes (Signed)
  Phone Note Refill Request Message from:  Fax from Pharmacy on June 07, 2010 8:57 AM  Refills Requested: Medication #1:  SIMVASTATIN 10 MG TABS 1 by mouth at bedtime Initial call taken by: Ami Bullins CMA,  June 07, 2010 8:57 AM    Prescriptions: SIMVASTATIN 10 MG TABS (SIMVASTATIN) 1 by mouth at bedtime  #90 x 3   Entered by:   Ami Bullins CMA   Authorized by:   Neena Rhymes MD   Signed by:   Charlynne Cousins CMA on 06/07/2010   Method used:   Faxed to ...       [facility] (mail-order)             , [place]         Ph:        Fax: [phone]   RxID:   [id number]

## 2010-12-02 NOTE — Assessment & Plan Note (Signed)
Summary: C3J      Allergies Added:   Visit Type:  Follow-up Primary Provider:  Cristie Hem Plotnikov,M.D  CC:  no complaints.  History of Present Illness: Mr. Mahmood and I had a long discussion today regarding his status.  He appears to be doing well.  He does not have any new coronary related symptoms.  I discussed with him at length the current status of DAPT, first generation stents, options with regard to continuing or stopping, Charisma trial results, etc.  He seemed to understand the discussion.   Overall he feels good and has remained active.   The patient has previously fallen from a ladder, and his son, and ENT surgeon, has discussed with him the need to avoid things such as this.    Current Problems (verified): 1)  Carotid Artery Disease  (ICD-433.10) 2)  Chalazion  (ICD-373.2) 3)  Renal Insufficiency  (ICD-588.9) 4)  Neoplasm of Uncertain Behavior of Skin  (ICD-238.2) 5)  Psa, Increased  (ICD-790.93) 6)  Physical Examination  (ICD-V70.0) 7)  Coronary Artery Disease  (ICD-414.00) 8)  Myocardial Infarction, Hx of  (ICD-412) 9)  Hypertension  (ICD-401.9) 10)  Hypercholesterolemia  (ICD-272.0) 11)  Anticoagulation Therapy  (ICD-V58.61) 12)  Bradycardia  (ICD-427.89) 13)  Gerd  (ICD-530.81) 14)  Fatigue  (ICD-780.79) 15)  Hypogonadism, Male  (ICD-257.2) 16)  Personal Hx Colonic Polyps  (ICD-V12.72) 17)  Osteoporosis  (ICD-733.00) 18)  Cholelithiasis  (ICD-574.2) 19)  Calcu Gallbladd w/o Mention Cholecyst/obst  (ICD-574.20) 20)  Celiac Disease  (ICD-579.0) 21)  Weight Loss  (ICD-783.21) 22)  Asthma  (ICD-493.90) 23)  Diarrhea  (ICD-787.91) 24)  Hypothyroidism  (ICD-244.9) 25)  Gout  (ICD-274.9) 26)  Tubulovillous Adenoma, Colon  (ICD-211.3)  Current Medications (verified): 1)  Plavix 75 Mg  Tabs (Clopidogrel Bisulfate) .... Once Daily 2)  Viagra 100 Mg  Tabs (Sildenafil Citrate) .Marland Kitchen.. 1 Po As Needed 3)  Levothyroxine Sodium 75 Mcg Tabs (Levothyroxine Sodium) .... Take 1  Tablet By Mouth Once A Day 4)  Multivitamins   Tabs (Multiple Vitamin) .... Once Daily 5)  Nitroglycerin 0.4 Mg Subl (Nitroglycerin) .... Place 1 Tablet Under Tongue As Directed 6)  Adult Aspirin Low Strength 81 Mg  Tbdp (Aspirin) .... Once Daily 7)  Fish Oil   Oil (Fish Oil) .... Once Daily 8)  Testosterone Cypionate 200 Mg/ml Oil (Testosterone Cypionate) .... 400 Mg Im Q 2 Wks 9)  Bd Integra Syringe 23g X 1" 3 Ml Misc (Syringe/needle (Disp)) .... Use As Directed Every 2 Wks (Ok Any Brand, Please Use 23g 1 Inch) 10)  Simvastatin 10 Mg Tabs (Simvastatin) .Marland Kitchen.. 1 By Mouth At Bedtime 11)  Xalatan 0.005 % Soln (Latanoprost) .... One Drop in Each Eye At Night 12)  Coreg 25 Mg Tabs (Carvedilol) .Marland Kitchen.. 1 By Mouth Two Times A Day For Blood Pressure 13)  Pennsaid 1.5 % Soln (Diclofenac Sodium) .... 3-5 Gtt On Skin Three Times A Day For Pain 14)  Rocaltrol 0.25 Mcg Caps (Calcitriol) .Marland Kitchen.. 1 By Mouth Qd  Allergies (verified): 1)  ! Neosporin Lt (Allantoin-Pramoxine) 2)  Metoprolol Tartrate (Metoprolol Tartrate)  Past History:  Past Medical History: Last updated: 07/07/2010 Current Problems:  CORONARY ARTERY DISEASE (ICD-414.00) MYOCARDIAL INFARCTION, HX OF (ICD-412) HYPERTENSION (ICD-401.9) HYPERCHOLESTEROLEMIA (ICD-272.0) ANTICOAGULATION THERAPY (ICD-V58.61) BRADYCARDIA (ICD-427.89) GERD (ICD-530.81) FATIGUE (ICD-780.79) HYPOGONADISM, MALE (ICD-257.2) PERSONAL HX COLONIC POLYPS (ICD-V12.72) OSTEOPOROSIS (ICD-733.00) CHOLELITHIASIS (ICD-574.2) CALCU GALLBLADD W/O MENTION CHOLECYST/OBST (ICD-574.20) CELIAC DISEASE (ICD-579.0) WEIGHT LOSS (ICD-783.21) ASTHMA (ICD-493.90) DIARRHEA (ICD-787.91) HYPOTHYROIDISM (ICD-244.9) GOUT (ICD-274.9) TUBULOVILLOUS ADENOMA,  COLON (ICD-211.3) Renal insufficiency Elev PTH due to CRI and Celiac dz 2011  Vital Signs:  Patient profile:   70 year old male Height:      72 inches Weight:      207 pounds BMI:     28.18 Pulse rate:   60 / minute Resp:      16 per minute BP sitting:   142 / 88  (left arm)  Vitals Entered By: Levora Angel, CNA (October 12, 2010 3:03 PM)  Physical Exam  General:  Well developed, well nourished, in no acute distress. Head:  normocephalic and atraumatic Eyes:  PERRLA/EOM intact; conjunctiva and lids normal. Lungs:  Clear bilaterally to auscultation and percussion. Heart:  PMI non displaced. Normal S1 and S2.  No def rub or gallop. Pulses:  pulses normal in all 4 extremities Extremities:  No clubbing or cyanosis. Neurologic:  Alert and oriented x 3.   EKG  Procedure date:  10/12/2010  Findings:      SB.  WNL.    Impression & Recommendations:  Problem # 1:  CORONARY ARTERY DISEASE (ICD-414.00) Stable status at present.  No cardiac symptoms.  Prior placement of DES, first generation DES, into RCA with overlapping stents, and single stent in the CFX artery in 2005.  RCA was totally occluded at that time, although not STEMI presentation.  He remains stable.  Issues regarding DAPT discussed.  No bleeding issues noted, and patient has done well.  We reveiwed issues of DES, ACS, and DAPT with first generation stents.  He understands, wishes to remain on DAPT.  His updated medication list for this problem includes:    Plavix 75 Mg Tabs (Clopidogrel bisulfate) ..... Once daily    Nitroglycerin 0.4 Mg Subl (Nitroglycerin) .Marland Kitchen... Place 1 tablet under tongue as directed    Adult Aspirin Low Strength 81 Mg Tbdp (Aspirin) ..... Once daily    Coreg 25 Mg Tabs (Carvedilol) .Marland Kitchen... 1 by mouth two times a day for blood pressure  Orders: EKG w/ Interpretation (93000)  Problem # 2:  HYPERCHOLESTEROLEMIA (ICD-272.0) discussed with patient.  Last LDL was 77.  HDL low.  New trials reviewed.  Near target, but could be pushed harder, but ok with current dose.  His updated medication list for this problem includes:    Simvastatin 10 Mg Tabs (Simvastatin) .Marland Kitchen... 1 by mouth at bedtime  Problem # 3:  RENAL INSUFFICIENCY  (ICD-588.9) Followed by [facility].   Last Cr 1.8  Problem # 4:  CAROTID ARTERY DISEASE (ICD-433.10) 0-39% bilaterally on last check.  Continue the same.   His updated medication list for this problem includes:    Plavix 75 Mg Tabs (Clopidogrel bisulfate) ..... Once daily    Adult Aspirin Low Strength 81 Mg Tbdp (Aspirin) ..... Once daily  Patient Instructions: 1)  Your physician recommends that you continue on your current medications as directed. Please refer to the Current Medication list given to you today. 2)  Your physician wants you to follow-up in:  1 YEAR.  You will receive a reminder letter in the mail two months in advance. If you don't receive a letter, please call our office to schedule the follow-up appointment.

## 2010-12-02 NOTE — Assessment & Plan Note (Signed)
Summary: CPX / SECURE HORIZIONS/[employer]  #   Vital Signs:  Patient profile:   70 year old male Height:      72 inches Weight:      211 pounds BMI:     28.72 Temp:     98.5 degrees F oral Pulse rate:   80 / minute Pulse rhythm:   regular Resp:     16 per minute BP sitting:   150 / 90  (left arm) Cuff size:   regular  Vitals Entered By: Jonathon Resides, Gabrielle Dare) (November 24, 2010 10:57 AM)   Primary Care Provider:  Cristie Hem Plotnikov,M.D   History of Present Illness: The patient presents for a preventive health examination    Preventive Screening-Counseling & Management  Alcohol-Tobacco     Alcohol drinks/day: no     Smoking Status: never  Caffeine-Diet-Exercise     Caffeine use/day: less than 1     Does Patient Exercise: yes     Type of exercise: treadmill,weightlifting     Times/week: 2  Hep-HIV-STD-Contraception     Dental Visit-last 6 months yes     TSE monthly: no     Sun Exposure-Excessive: no  Safety-Violence-Falls     Seat Belt Use: yes     Helmet Use: n/a     Firearms in the Home: no firearms in the home     Smoke Detectors: yes     Violence in the Home: no risk noted     Sexual Abuse: no     Fall Risk: no  Current Medications (verified): 1)  Plavix 75 Mg  Tabs (Clopidogrel Bisulfate) .... Once Daily 2)  Viagra 100 Mg  Tabs (Sildenafil Citrate) .Marland Kitchen.. 1 Po As Needed 3)  Levothyroxine Sodium 75 Mcg Tabs (Levothyroxine Sodium) .... Take 1 Tablet By Mouth Once A Day 4)  Multivitamins   Tabs (Multiple Vitamin) .... Once Daily 5)  Nitroglycerin 0.4 Mg Subl (Nitroglycerin) .... Place 1 Tablet Under Tongue As Directed 6)  Adult Aspirin Low Strength 81 Mg  Tbdp (Aspirin) .... Once Daily 7)  Fish Oil   Oil (Fish Oil) .... Once Daily 8)  Testosterone Cypionate 200 Mg/ml Oil (Testosterone Cypionate) .... 400 Mg Im Q 2 Wks 9)  Bd Integra Syringe 23g X 1" 3 Ml Misc (Syringe/needle (Disp)) .... Use As Directed Every 2 Wks (Ok Any Brand, Please Use 23g 1 Inch) 10)   Simvastatin 10 Mg Tabs (Simvastatin) .Marland Kitchen.. 1 By Mouth At Bedtime 11)  Xalatan 0.005 % Soln (Latanoprost) .... One Drop in Each Eye At Night 12)  Coreg 25 Mg Tabs (Carvedilol) .Marland Kitchen.. 1 By Mouth Two Times A Day For Blood Pressure 13)  Pennsaid 1.5 % Soln (Diclofenac Sodium) .... 3-5 Gtt On Skin Three Times A Day For Pain 14)  Rocaltrol 0.25 Mcg Caps (Calcitriol) .Marland Kitchen.. 1 By Mouth Qd  Allergies (verified): 1)  ! Neosporin Lt (Allantoin-Pramoxine) 2)  Metoprolol Tartrate (Metoprolol Tartrate)  Past History:  Past Medical History: Last updated: 07/07/2010 Current Problems:  CORONARY ARTERY DISEASE (ICD-414.00) MYOCARDIAL INFARCTION, HX OF (ICD-412) HYPERTENSION (ICD-401.9) HYPERCHOLESTEROLEMIA (ICD-272.0) ANTICOAGULATION THERAPY (ICD-V58.61) BRADYCARDIA (ICD-427.89) GERD (ICD-530.81) FATIGUE (ICD-780.79) HYPOGONADISM, MALE (ICD-257.2) PERSONAL HX COLONIC POLYPS (ICD-V12.72) OSTEOPOROSIS (ICD-733.00) CHOLELITHIASIS (ICD-574.2) CALCU GALLBLADD W/O MENTION CHOLECYST/OBST (ICD-574.20) CELIAC DISEASE (ICD-579.0) WEIGHT LOSS (ICD-783.21) ASTHMA (ICD-493.90) DIARRHEA (ICD-787.91) HYPOTHYROIDISM (ICD-244.9) GOUT (ICD-274.9) TUBULOVILLOUS ADENOMA, COLON (ICD-211.3) Renal insufficiency Elev PTH due to CRI and Celiac dz 2011  Past Surgical History: Last updated: 08/31/2009  Rt. Knee surgery  Cystoscopy-1998  stenting of the lesion  in the distal circumflex artery with a Cypher stent-2005 overlapping stents in RCA--Cypher.  Family History: Last updated: 08/31/2009 Family History Hypertension No asthma No FH of Colon Cancer Lymphoma-father  Social History: Last updated: 03/10/2010 Retired Married Alcohol use-no Regular exercise- yoga and silver sneakers Never Smoked Daily Caffeine Use Patient gets regular exercise.  Social History: Caffeine use/day:  less than 1 Dental Care w/in 6 mos.:  yes Sun Exposure-Excessive:  no Seat Belt Use:  yes Fall Risk:  no  Review of  Systems  The patient denies anorexia, fever, weight loss, weight gain, vision loss, decreased hearing, hoarseness, chest pain, syncope, dyspnea on exertion, peripheral edema, prolonged cough, headaches, hemoptysis, abdominal pain, melena, hematochezia, severe indigestion/heartburn, hematuria, incontinence, genital sores, muscle weakness, suspicious skin lesions, transient blindness, difficulty walking, depression, unusual weight change, abnormal bleeding, enlarged lymph nodes, angioedema, and testicular masses.    Physical Exam  General:  NAD Head:  Normocephalic and atraumatic without obvious abnormalities. No apparent alopecia or balding. Eyes:  No corneal or conjunctival inflammation noted. EOMI. Perrla. Ears:  External ear exam shows no significant lesions or deformities.  Otoscopic examination reveals clear canals, tympanic membranes are intact bilaterally without bulging, retraction, inflammation or discharge. Hearing is grossly normal bilaterally. Nose:  External nasal examination shows no deformity or inflammation. Nasal mucosa are pink and moist without lesions or exudates. Mouth:  Oral mucosa and oropharynx without lesions or exudates.  Teeth in good repair. Neck:  No deformities, masses, or tenderness noted. Lungs:  Normal respiratory effort, chest expands symmetrically. Lungs are clear to auscultation, no crackles or wheezes. Heart:  Normal rate and regular rhythm. S1 and S2 normal without gallop, murmur, click, rub or other extra sounds. Abdomen:  Bowel sounds positive,abdomen soft and non-tender without masses, organomegaly or hernias noted. Rectal:  per Urol Prostate:  per Urol Msk:  No deformity or scoliosis noted of thoracic or lumbar spine.   Extremities:  No clubbing, cyanosis, edema, or deformity noted with normal full range of motion of all joints.   Neurologic:  No cranial nerve deficits noted. Station and gait are normal. Plantar reflexes are down-going bilaterally. DTRs  are symmetrical throughout. Sensory, motor and coordinative functions appear intact. Skin:  Intact without suspicious lesions or rashes Psych:  Cognition and judgment appear intact. Alert and cooperative with normal attention span and concentration. No apparent delusions, illusions, hallucinations   Impression & Recommendations:  Problem # 1:  PHYSICAL EXAMINATION (ICD-V70.0) Assessment New Health and age related issues were discussed. Available screening tests and vaccinations were discussed as well. Healthy life style including good diet and exercise was discussed.  The labs were reviewed with the patient.   Problem # 2:  HYPERTENSION (ICD-401.9) Assessment: Deteriorated  His updated medication list for this problem includes:    Coreg 25 Mg Tabs (Carvedilol) .Marland Kitchen... 1 by mouth two times a day for blood pressure    Losartan Potassium 100 Mg Tabs (Losartan potassium) .Marland Kitchen... 1 by mouth once daily for blood pressure  Problem # 3:  CELIAC DISEASE (ICD-579.0) Assessment: Unchanged  On diet  Problem # 4:  CAROTID ARTERY DISEASE (ICD-433.10) Assessment: Unchanged  His updated medication list for this problem includes:    Plavix 75 Mg Tabs (Clopidogrel bisulfate) ..... Once daily    Adult Aspirin Low Strength 81 Mg Tbdp (Aspirin) ..... Once daily  Problem # 5:  PSA, INCREASED (ICD-790.93) Assessment: Comment Only  Urol q 12 months   Complete Medication List: 1)  Plavix 75 Mg Tabs (Clopidogrel  bisulfate) .... Once daily 2)  Viagra 100 Mg Tabs (Sildenafil citrate) .Marland Kitchen.. 1 po as needed 3)  Levothyroxine Sodium 75 Mcg Tabs (Levothyroxine sodium) .... Take 1 tablet by mouth once a day 4)  Multivitamins Tabs (Multiple vitamin) .... Once daily 5)  Nitroglycerin 0.4 Mg Subl (Nitroglycerin) .... Place 1 tablet under tongue as directed 6)  Adult Aspirin Low Strength 81 Mg Tbdp (Aspirin) .... Once daily 7)  Fish Oil Oil (Fish oil) .... Once daily 8)  Testosterone Cypionate 200 Mg/ml Oil  (Testosterone cypionate) .... 400 mg im q 2 wks 9)  Bd Integra Syringe 23g X 1" 3 Ml Misc (Syringe/needle (disp)) .... Use as directed every 2 wks (ok any brand, please use 23g 1 inch) 10)  Simvastatin 10 Mg Tabs (Simvastatin) .Marland Kitchen.. 1 by mouth at bedtime 11)  Xalatan 0.005 % Soln (Latanoprost) .... One drop in each eye at night 12)  Coreg 25 Mg Tabs (Carvedilol) .Marland Kitchen.. 1 by mouth two times a day for blood pressure 13)  Pennsaid 1.5 % Soln (Diclofenac sodium) .... 3-5 gtt on skin three times a day for pain 14)  Rocaltrol 0.25 Mcg Caps (Calcitriol) .Marland Kitchen.. 1 by mouth qd 15)  Losartan Potassium 100 Mg Tabs (Losartan potassium) .Marland Kitchen.. 1 by mouth once daily for blood pressure 16)  Voltaren 1 % Gel (Diclofenac sodium) .... Use two times a day on a joint(s) 17)  Levothroid 100 Mcg Tabs (Levothyroxine sodium) .Marland Kitchen.. 1 by mouth once daily for thyroid  Other Orders: TD Toxoids IM 7 YR + (44010) Admin 1st Vaccine (27253)  Patient Instructions: 1)  Normal BP is <130/85 2)  Please schedule a follow-up appointment in 6 months. 3)  BMP prior to visit, ICD-9: 4)  TSH prior to visit, ICD-9:585 5)  Vit D 268.9 6)  Vit B12579.0 7)  CBC w/ Diff prior to visit, ICD-9: Prescriptions: LEVOTHROID 100 MCG TABS (LEVOTHYROXINE SODIUM) 1 by mouth once daily for thyroid  #90 x 3   Entered and Authorized by:   Cassandria Anger MD   Signed by:   Cassandria Anger MD on 11/24/2010   Method used:   Print then Give to Patient   RxID:   [id number] VOLTAREN 1 % GEL (DICLOFENAC SODIUM) use two times a day on a joint(s)  #90 g x 3   Entered and Authorized by:   Cassandria Anger MD   Signed by:   Cassandria Anger MD on 11/24/2010   Method used:   Print then Give to Patient   RxID:   [id number] POTASSIUM 100 MG TABS (LOSARTAN POTASSIUM) 1 by mouth once daily for blood pressure  #30 x 12   Entered and Authorized by:   Cassandria Anger MD   Signed by:   Cassandria Anger MD on 11/24/2010    Method used:   Print then Give to Patient   RxID:   [id number]    Orders Added: 1)  TD Toxoids IM 7 YR + [70623] 2)  Admin 1st Vaccine [76283] 3)  Est. Patient 65& > [15176]   Immunizations Administered:  Tetanus Vaccine:    Vaccine Type: Td    Site: left deltoid    Mfr: [place]    Dose: 0.5 ml    Route: IM    Given by: Jonathon Resides, CMA(AAMA)    Exp. Date: 02/17/2012    Lot #: [id number]    VIS given: 09/17/08 version given November 24, 2010.   Immunizations Administered:  Tetanus  Vaccine:    Vaccine Type: Td    Site: left deltoid    Mfr: [place]    Dose: 0.5 ml    Route: IM    Given by: Jonathon Resides, CMA(AAMA)    Exp. Date: 02/17/2012    Lot #: [id number]    VIS given: 09/17/08 version given November 24, 2010.

## 2011-01-13 ENCOUNTER — Telehealth: Payer: Self-pay | Admitting: Internal Medicine

## 2011-01-18 NOTE — Progress Notes (Signed)
Summary: RF Testosterone  Phone Note Refill Request   Refills Requested: Medication #1:  TESTOSTERONE CYPIONATE 200 MG/ML OIL 400 mg im q 2 wks [facility]  Initial call taken by: Charlsie Quest, [place],  January 13, 2011 4:43 PM  Follow-up for Phone Call        ok x 6 Follow-up by: Cassandria Anger MD,  January 13, 2011 8:38 PM  Additional Follow-up for Phone Call Additional follow up Details #1::        Pt informed  Additional Follow-up by: Charlsie Quest, CMA,  January 14, 2011 10:14 AM    Prescriptions: TESTOSTERONE CYPIONATE 200 MG/ML OIL (TESTOSTERONE CYPIONATE) 400 mg im q 2 wks  #54m x 2   Entered by:   SCharlsie Quest CMA   Authorized by:   ACassandria AngerMD   Signed by:   SCharlsie Quest CMA on 01/14/2011   Method used:   Telephoned to ...       [facility] [id number] (retail)       [address]  [address]      Ph: [phone]      Fax: [phone]  RxID:   [id number]

## 2011-03-15 NOTE — Letter (Signed)
Mar 06, 2007    Oacoma Plotnikov, MD  [address],  [place] [address]   RE:  [name], [name]  MRN:  [id number]  /  DOB:  1940/12/15   Dear Tyrone Apple:   I had the pleasure of seeing your nice patient, Scott Oneal, in the  office today in followup. Cardiacwise, he is getting along well. He has  not been exercising much probably because of some inflammation on the  heel. He gets an Achilles inflammation. He denies any ongoing chest pain  however. He is tolerating aspirin and Plavix well. He tells me he had  endoscopy done by Lucio Edward. Two polyps were removed, but he  tolerated coming off of the Plavix. He and I had an extensive discussion  about the current Plavix issue with regard to drug-eluting stents. The  patient also has had a carotid study done in August of last year and is  due for a repeat carotid study next year.   On physical today, the blood pressure is 140/88, pulse 51.  Lung fields are clear.  Cardiac rhythm is regular. There is no significant murmur.   The electrocardiogram demonstrates sinus bradycardia. It is otherwise  unremarkable.   We have asked [name] to come back in six months for an exercise  treadmill study. I have also asked him to check his blood pressures on a  frequent basis and keep an eye on specific numbers. If they remain high,  potentially he may require more therapy. He and I also discussed  the aspirin and Plavix issue in some detail and he wants to use non-  steroidals, but I encouraged him to use these on a fairly limited basis.  He is okay with this.   Thanks for allowing me to share in his care. He will continue to  followup with you closely.    Sincerely,      Loretha Brasil. Lia Foyer, MD, [facility]  Electronically Signed    TDS/MedQ  DD: 03/06/2007  DT: 03/06/2007  Job #: [id number]

## 2011-03-15 NOTE — Assessment & Plan Note (Signed)
[facility]                         GASTROENTEROLOGY OFFICE NOTE   SAMIT, SYLVE                        MRN:          [id number]  DATE:03/04/2008                            DOB:          [birth year]    [name] returns for followup of persistent diarrhea.  He states he is  having between one and three loose stools a day.  He has noted a  decrease in appetite, which was not present when I saw him previously.  He continues to note a gradual weight loss with a 2 lb. loss since his  last visit.  He notes no abdominal pain, fevers, chills, melena or  hematochezia.  He has not had his home well water tested or treated as  advised.  He completed a course of Cipro and remains of Florastor.   CURRENT MEDICATIONS:  Listed on the chart, updated and reviewed.   MEDICATION ALLERGIES:  Neosporin.   EXAMINATION:  No acute distress.  Weight 197 pounds.  Blood pressure is  110/60.  Pulse 64 and regular.  CHEST:  Clear to auscultation bilaterally.  CARDIAC:  Regular rate and rhythm without murmurs.  ABDOMEN:  Soft, nontender, nondistended.  Normoactive bowel sounds.  No  palpable organomegaly, masses or hernias.   ASSESSMENT/PLAN:  Chronic diarrhea associated with anorexia and a  progressive weight loss.  Obtain a CMET, CBC, lipase, TSH and tissue  transglutaminase.  Obtain stool hemocults. Schedule a CT scan of the  abdomen and pelvis.  Obtain all standard stool studies.  Begin Imodium  AD b.i.d. p.r.n.  He can discontinue Florastor qd after a 30 day trial.  Return office visit two to three weeks.     Pricilla Riffle. Fuller Plan, MD, [facility]  Electronically Signed    MTS/MedQ  DD: 03/04/2008  DT: 03/04/2008  Job #: [id number]   cc:   Evie Lacks. Plotnikov, MD

## 2011-03-15 NOTE — Assessment & Plan Note (Signed)
[facility]                            CARDIOLOGY OFFICE NOTE   GEOFFERY, AULTMAN                        MRN:          [id number]  DATE:08/04/2008                            DOB:          09/02/1941    HISTORY OF PRESENT ILLNESS:  Mr. Makris is in for followup visit to  review.  Overall, he has been doing relatively well.  He does complain  of a little bit of low energy.  He denies any ongoing chest pain.  He  has talked with Tyrone Apple Plotnikov about his feelings, and they have been  looking at a number of things including up to his thyroid.  The patient  had a fairly long problem with his GI tract, but a diagnosis of celiac  sprue was subsequently been made and he has improved rather  substantially with alteration of his diet.  In addition, his medication  was altered, and he is currently just taking Toprol 25 mg half a tablet  daily.  He is also been placed on testosterone since I saw him last, and  is now on simvastatin.  As far as chest pain goes, the patient has been  relatively asymptomatic.  He and I had a thorough discussion in the past  about the issue regarding dual antiplatelet therapy, and what the  duration of this should be.  We talked about the guidelines, the current  consensus, and the fact that he had these put in the setting of acute  myocardial infarction and has overlapping stents.  Last catheterization  in June 04, 2004 revealed total occlusion of the right coronary artery  during which he had stenting of both the RCA done.  His distal  circumflex was ultimately stented as well also using a drug-eluting  platform.   CURRENT MEDICATIONS:  1. Multivitamin daily.  2. Synthroid 0.075 mg daily.  3. Aspirin 81 mg daily.  4. Toprol 25 mg one-half tablet daily.  5. Plavix 75 mg daily.  6. Vitamin D daily.  7. Fish oil daily.  8. Testosterone daily.  9. Simvastatin 40 mg nightly.   PHYSICAL EXAMINATION:  He is alert and oriented,  in no distress.  Blood  pressure 141/79, the pulse is 75.  The lung fields are clear.  The  cardiac rhythm is regular.   The electrocardiogram demonstrates normal sinus rhythm is within normal  limits.   IMPRESSION:  1. Coronary artery disease with presentation of acute myocardial      infarction treated with overlapping Cypher stents in the right      coronary artery and an elective stent in the circumflex involved at      that time in the Triton randomized trial, currently on dual      antiplatelet therapy with both aspirin and Plavix.  2. Hypercholesterolemia, currently on simvastatin therapy.  3. Hypothyroidism on thyroid replacement.  4. History of fatigue.  5. Hypertension with borderline blood pressure today.   If the patient remains fatigued, it would not be unreasonable to  consider discontinuation of his Toprol at all.  He has been stable  from  this standpoint.  He is out now approximately 4 years.  However, we will  need to get some blood pressure follow ups, and see how well his blood  pressure is controlled.  Long-term control of this would be important.  We will see him back in followup in 3 months' time.   ADDENDUM  He might be a reasonable candidate for ACE inhibitors.     Loretha Brasil. Lia Foyer, MD, [facility]  Electronically Signed    TDS/MedQ  DD: 08/06/2008  DT: 08/07/2008  Job #: [id number]

## 2011-03-15 NOTE — Assessment & Plan Note (Signed)
[facility]                         GASTROENTEROLOGY OFFICE NOTE   Scott, Oneal                        MRN:          [id number]  DATE:02/25/2008                            DOB:          [birth year]    [name] is a 70 year old white male who complains of diarrhea for the  past four weeks. He describes a change in bowel habits from one formed  bowel movement a day until one to two loose nonbloody bowel movements a  day. He notes the loose stool has a foul odor. He states stool cultures  were performed by Dr. Alain Oneal that were negative. I cannot locate the  results at this time. He states he was treated with a course of  metronidazole for approximately one week, and he discontinued it with no  change in symptoms. He was placed on hydrocodone temporarily for a  kidney stone, and his diarrhea resolved. Lomotil has also been effective  in controlling his diarrhea. He notes no recent antibiotic usage or  medication changes. He has well water both at his home in [place] and  his home in the [facility] He states [place] but he has not had his home  water tested, nor recently treated.   CURRENT MEDICATIONS:  Listed on the chart, updated and reviewed.   MEDICATION ALLERGIES:  NEOSPORIN.   PHYSICAL EXAMINATION:  No acute distress. Weight 199 pounds, down from  211 pounds in September 2008. Blood pressure 126/70, pulse 60 and  regular.  HEENT:  Anicteric sclerae.  Oropharynx clear.  CHEST:  Clear to auscultation bilaterally.  CARDIAC:  Regular rate and rhythm without murmurs appreciated.  ABDOMEN:  Soft; nontender; nondistended; normal active bowel sounds; no  palpable  Organomegaly, masses or hernias; hyper active bowel sounds.   ASSESSMENT AND PLAN:  1. Diarrhea for four weeks. Rule out infectious etiologies. Begin an      empiric course of Cipro 500 mg p.o. b.i.d. for 7  days along with      Florastor 1 p.o. daily for 1 month. If his symptoms do not      completely resolve with the above treatment, he is advised to      return for followup. Locate and review the stool studies sent by      Dr. Alain Oneal.  2. Personal history of tubulovillous adenomatous colon polyps.      Surveillance colonoscopy recommended for September 2010.     Pricilla Riffle. Fuller Plan, MD, [facility]  Electronically Signed    MTS/MedQ  DD: 02/25/2008  DT: 02/25/2008  Job #: [id number]

## 2011-03-15 NOTE — Op Note (Signed)
[name], Scott Oneal                 ACCOUNT NO.:  [id number]   MEDICAL RECORD NO.:  [id number]          PATIENT TYPE:  AMB   LOCATION:  [facility]                         FACILITY:  [facility]   PHYSICIAN:  Lillette Boxer. Dahlstedt, M.D.DATE OF BIRTH:  December 23, 1940   DATE OF PROCEDURE:  05/23/2007  DATE OF DISCHARGE:                               OPERATIVE REPORT   PREOPERATIVE DIAGNOSIS:  Left ureteral calculus.   POSTOPERATIVE DIAGNOSIS:  Passed left ureteral calculus.   PROCEDURE:  Cystoscopy with left retrograde ureteral pyelogram and left  ureteroscopy.   SURGEON:  Lillette Boxer. Dahlstedt, M.D.   ANESTHESIA:  General with LMA.   COMPLICATIONS:  None.   BRIEF HISTORY:  This 70 year old male has had persistent intermittent  pain with a left ureteral calculus.  He has been having pain for about a  month now.  His most recent pain was a couple of weeks ago.  Evaluation  has revealed a 4 x 4-mm calcification in the area of the left UVJ.   Since he has had persistent pain, it was recommended that he undergo  ureteroscopic stone extraction.  Risks and complications of the  procedure were discussed with the patient.  He understands these and  agrees to proceed.   KUB this morning revealed a calcification present in the left pelvis  consistent with a possible left ureteral stone.   DESCRIPTION OF PROCEDURE:  The patient was identified in the holding  area, the surgical site marked.  He was administered preoperative IV  antibiotics.  He was taken to the operating room, where a general  anesthetic was administered using the LMA.  He was placed in the dorsal  lithotomy position.  Genitalia and perineum were prepped and draped.  A  22-French panendoscope was advanced into his bladder.  Urethra was  normal.  Prostate nonobstructive.  Bladder entered and inspected  circumferentially.  No tumors, trabeculations or foreign bodies.  The  left ureteral orifice was cannulated with a 5-French open-ended  catheter  and a retrograde performed.  I did not see any specific filling defects.  There was an air bubble that looked like it moved around in this area.  From a diagnostic standpoint, I then passed a 6-French short  ureteroscope.  I was easily able enter the left ureteral orifice.  About  a centimeter or 2 up, there was an area that was slightly reddened with  some mild reactive polyps.  These were not papillary in nature and I did  not think that they were suspicious-looking.  Just proximal to this,  there was some mild dilatation of the ureter.  I looked up the remaining  ureter and I did not see any calculi.  The scope was advanced all the  way up into the renal pelvis.   At this point, the scope was removed and I looked while I was removing  the scope.  Again, no stone was seen.  The bladder was inspected and I  did not find any calculi there.   At this point, the procedure was terminated.  The patient's bladder was  emptied.  He was taken to the PACU in stable condition.      Lillette Boxer. Dahlstedt, M.D.  Electronically Signed     SMD/MEDQ  D:  05/23/2007  T:  05/24/2007  Job:  [id number]   cc:   Evie Lacks. Plotnikov, MD  [address]  [place] [address]

## 2011-03-18 NOTE — Assessment & Plan Note (Signed)
[facility]                              CARDIOLOGY OFFICE NOTE   Scott, Oneal                          MRN:          [id number]  DATE:06/20/2006                            DOB:          [birth year]    Scott Oneal is in for a follow-up visit.  He is scheduled to have a  colonoscopy.  They want him to stop his Plavix for 5 days.  They have agreed  that he will stay on aspirin during that period of time.  The patient  previously underwent percutaneous intervention in August 2005.  At that time  he had treatment of the right coronary artery with overlapping Cypher stents  and a Cypher placed distally in the circumflex.  He has been virtually  asymptomatic since that time.  He does have mild thrombocytopenia.   PHYSICAL EXAMINATION:  VITAL SIGNS:  The blood pressure is 134/76, pulse 51.  LUNGS:  Clear.  CARDIAC:  The cardiac rhythm is regular.   The patient has a history of multiple adenomatous colonic polyps.  He is  overdue for recall colonoscopy.  They will hold his Plavix for 5 days and he  will stay on aspirin throughout the colonoscopy.  The risks have been  discussed with Dr. Fuller Plan, and I have reviewed with the patient in extensive  detail what the issues are with regard to drug-eluting stents at the present  time.  We will see him back in follow-up in 3-6 months.  Should he have any  problems in the interim, he is to call us.                              Loretha Brasil. Lia Foyer, MD, [facility]    TDS/MedQ  DD:  06/20/2006  DT:  06/21/2006  Job #:  [id number]

## 2011-03-18 NOTE — Op Note (Signed)
Scott Oneal, Scott Oneal                 ACCOUNT NO.:  [id number]   MEDICAL RECORD NO.:  [id number]          PATIENT TYPE:  AMB   LOCATION:  [place]                          FACILITY:  [place]   PHYSICIAN:  Ninetta Lights, M.D. DATE OF BIRTH:  10-15-1941   DATE OF PROCEDURE:  01/12/2006  DATE OF DISCHARGE:                                 OPERATIVE REPORT   PREOPERATIVE DIAGNOSIS:  Right knee medial and lateral meniscus tears.   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   PROCEDURE:  Right knee examination under anesthesia, arthroscopy,  debridement of medial and lateral meniscus tears, chondroplasty of the  patella.   SURGEON:  Ninetta Lights, M.D.   ASSISTANT:  Alyson Locket. Velora Heckler.   ANESTHESIA:  Knee block with sedation.   SPECIMENS:  None.   CULTURES:  None.   COMPLICATIONS:  None.   DRESSING:  Soft compressive.   PROCEDURE:  Patient brought to the operating room, placed on the operating  room table in supine position.  After adequate anesthesia had been obtained,  the right knee examined.  Full motion with good stability.  Positive  McMurray's, especially medially.  Stable ligaments.  Tourniquet in leg  holder applied.  Leg prepped and draped in the usual sterile fashion.  Three  portals were created, one superolateral, one each medial and lateral  parapatellar.  Inflow catheter introduced.  The knee distended, arthroscope  introduced.  Good patellofemoral tracking but some grade 2, mild grade 3  changes on both sides of the patellofemoral joint, all debrided.  A lot of  hypertrophic synovitis removed.  Cruciate ligaments intact.  Medial  compartment, extensive degenerative tearing with numerous displaced flap  tears, the entire posterior half of the medial meniscus.  Taken all the way  out to a stable rim.  __________.  Very little in the way of degenerative  changes there or  laterally.  On the lateral side, he did not have a discoid  meniscus but had marked intrameniscal tearing of the middle and posterior  third lateral meniscus.  That area was saucerized out to a stable rim,  tapered in smoothly, salvaging some of the meniscus in the back and a lot of  the anterior third.  What was left was stable, intact, and functional.  The  entire knee examined to be sure all of these  fragments were removed.  Instruments and fluid removed.  Portals of the knee  injected with Marcaine.  Portals were closed with 4-0 nylon.  A sterile  compressive dressing applied.  Anesthesia reversed.  Brought to the recovery  room.  Tolerated surgery well with no complications.      Ninetta Lights, M.D.  Electronically Signed     DFM/MEDQ  D:  01/12/2006  T:  01/13/2006  Job:  [id number]

## 2011-03-18 NOTE — Discharge Summary (Signed)
NAME:  Scott Oneal, Scott Oneal                           ACCOUNT NO.:  [id number]   MEDICAL RECORD NO.:  [id number]                   PATIENT TYPE:  INP   LOCATION:  9381                                 FACILITY:  [place]   PHYSICIAN:  Jacqulyn Ducking, M.D.               DATE OF BIRTH:  05-05-1941   DATE OF ADMISSION:  06/04/2004  DATE OF DISCHARGE:  06/08/2004                                 DISCHARGE SUMMARY   DISCHARGE DIAGNOSES:  1. Admitted with unstable angina, exertional onset.  2. Acute inferior myocardial infarction, diagnosed at [facility].  3. Ruptured plaque in the proximal right coronary artery with 100% occlusion     and 80% distal circumflex stenosis.  4. Stage percutaneous coronary intervention to effected coronary arteries.     a. Overlapping CYPHER DES stents to the proximal right coronary artery,        on June 04, 2004, reducing a 100% stenosis to 0.     b. CYPHER stent to the distal circumflex, on June 07, 2004, reducing 80%        stenosis to 0.   SECONDARY DIAGNOSES:  1. Hypertension.  2. Dyslipidemia.  3. Hypothyroidism.  4. Gout.   FAMILY HISTORY:  Negative for coronary artery disease.   PROCEDURES:  1. June 04, 2004, Dr. [name] Quarry, placement of overlapping DES stents     (CYPHER) to the ruptured plaque in the proximal right coronary artery     reducing a 100% lesion to 0%.  2. June 07, 2004, Dr. Eustace Quail, stent placement at the distal left     circumflex stenosis reducing an 80% stenosis to 0.   DISCHARGE DISPOSITION:  Scott Oneal is ready for discharge, on June 08, 2004,  after undergoing a staged PCI for coronary stenosis.  He has tolerated both  procedures well.  His creatinine has not risen after either procedure.  Creatinine, on the day of discharge, was 1.5.  He has had no cardiac  dysrhythmias this hospitalization, no respiratory compromise.   He goes home with:  1. __________  study med.  2. Toprol XL 25 mg daily.  3. Enteric  coated aspirin 81 mg daily.  4. Synthroid 75 mcg daily.  5. Allopurinol 300 mg daily.  6. Foltx 1 mg daily.  7. Lipitor 10 mg daily at bedtime.  8. Nitroglycerin 0.4 mg one tablet under the tongue every five minutes x 3     doses as needed for chest pain.   If he has pain at the catheterization site, he is to use Tylenol.   He is asked not to lift heavy weights or strain for the next two weeks.  He  may begin driving Thursday, June 10, 2004.   DISCHARGE DIET:  Low-sodium, low-cholesterol.   The patient is enrolled in the __________  study and the patient will be  supplied with this medication prior to discharge.  He is to call [phone],  if he experiences swelling or increased pain at the catheterization site.  He has an office visit with Dr. Lia Foyer at [facility] at 2:45 p.m.  on July 06, 2004.  He may return to work on Monday, June 21, 2004, and  a notification to that effect has been given to the patient.   BRIEF HISTORY:  Scott Oneal is a 70 year old gentleman.  He has a history of  hypertension and hyperlipidemia but has never had any known cardiac disease.  On 8:30 in the evening on June 04, 2004, the patient was mowing his lawn.  He developed severe aching in both sides of his jaw.  He felt that every  tooth had a toothache.  These were associated with diaphoresis and nausea.  He also had some dyspnea.  He moderately severe chest tightness and became  incredibly weak.  He was barely able to walk to the house.  He called his  son who is an emergency Emergency planning/management officer.  He was advised to come  immediately to the emergency room, where he continued to have symptoms of  chest tightness, chest pain.  These gradually faded after initiation of IV  nitroglycerin, IV heparin.  The patient has been ruled in for myocardial  infarction and also has ST depressions on his inferior leads on  electrocardiogram.  He will be scheduled for a left heart catheterization.  This  will be done on an urgent basis later today.   HOSPITAL COURSE:  After admission to [facility] with acute inferior  wall myocardial infarction, the patient underwent left heart  catheterization.  The study showed 100% occlusion from a ruptured plaque in  the proximal right coronary artery.  Ejection fraction at the  catheterization was 60% with preserved inferior wall motion.  He also had a  distal lesion in the left circumflex of 80%.  This was planned for a staged  procedure later on.  The patient had done fairly well throughout his  hospital course.  He underwent as a staged procedure, on June 07, 2004,  where the 80% lesion was reduced to 0.  He has had no further chest pain  during this hospitalization.  Discharge medications ass dictated above and followup with Dr. Lia Foyer.   LABORATORY STUDIES:  Complete blood count, on June 07, 2004, white cells  are 5.2, hemoglobin 11.5, hematocrit 32.6, platelets 113.  Serum  electrolytes, on June 07, 2004, sodium 141, potassium 3.7, chloride 105,  bicarbonate 29, glucose 131, BUN is 17, creatinine 1.5.  His zenith troponin  I which means the highest figure that was measured was 41.35 on June 04, 2004 at 2050 hours.  His lipid studies, cholesterol 157, triglycerides 189,  HDL cholesterol is 36, LDL cholesterol is 83.  He had an HGBA1C drawn and  this measures at 5.9.  He did have evidence of impaired glucose tolerance in  all morning medication draws.  They were in serial fashion 115, 131, and  then 131 again.      Sueanne Margarita, P.A.                    Jacqulyn Ducking, M.D.    GM/MEDQ  D:  06/08/2004  T:  06/08/2004  Job:  [id number]   cc:   Loretha Brasil. Lia Foyer, M.D. Clifton V. Plotnikov, M.D. [facility]

## 2011-03-18 NOTE — Cardiovascular Report (Signed)
NAME:  Scott Oneal, Scott Oneal                           ACCOUNT NO.:  [id number]   MEDICAL RECORD NO.:  [id number]                   PATIENT TYPE:  INP   LOCATION:  2926                                 FACILITY:  [place]   PHYSICIAN:  Loretha Brasil. Lia Foyer, M.D. [facility]         DATE OF BIRTH:  1941/07/04   DATE OF PROCEDURE:  06/04/2004  DATE OF DISCHARGE:                              CARDIAC CATHETERIZATION   INDICATIONS FOR PROCEDURE:  Scott Oneal is a 70 year old gentleman who  presents with an acute coronary syndrome.  He had positive enzymes.  He was  brought to the catheterization laboratory for further evaluation.   PROCEDURE:  1. Left heart catheterization.  2. Selective coronary arteriography.  3. Selective left ventriculography.  4. Thrombectomy with diverse EEE catheter.  5. Insertion of temporary transvenous pacemaker.  6. Percutaneous angioplasty and stenting of the proximal right coronary     artery.   DESCRIPTION OF PROCEDURE:  The patient was brought to the catheterization  laboratory, prepped and draped in the usual fashion, following informed  consent.  Through an anterior puncture, the right femoral artery was easily  entered.  Initially, a 6 French sheath was placed.  Views of the left and  right coronary artery were obtained in multiple angiographic projections.  Central aortic and left ventricular pressures were measured with a pigtail.  Ventriculography was performed in the RAO projection.  The patient had an  occluded right coronary artery with some evidence of left-to-right  collateralization and well preserved overall LV with minimal, if any,  inferior wall hypokinesis.  Based on this, it was felt that opening the  right coronary artery was the appropriate strategy.  I discussed this with  the patient, his wife had gone to lunch.  With this in mind, we exchanged  the 6 French sheath for a 7 [place] sheath.  The patient was on an Integrilin  drip.  He was given intravenous  heparin to prolong the ACT between 200 to  300 seconds.  Following this, a femoral vein sheath was placed and the  temporary pacemaker was placed up into the inferior vena cava for backup.  The right coronary ostium was engaged with a JR4 guiding catheter and then  the lesion successfully crossed with a high torqued floppy wire.  A balloon  was passed down through this area, but would not inflate.  It was easy to  pass, therefore, we thought that there was probably a fair amount of  thrombus.  Thrombectomy was done with a Diver CE catheter.  Following this,  pre-dilatation was done with a 2.5 mm balloon.  The lesion was then stented  using a 30 x 33 Cypher drug-eluding stent.  The stent was just short of  covering the entire treatment area.  Therefore, it was placed slightly  distally.  A second stent was placed proximal to this with a 30 x 8 Cypher  stent.  The  whole area was then dilated with a 35 Quantum Maverick balloon.  Post-dilatation was done.  There was an excellent angiographic appearance.  All catheters were subsequently removed, and the femoral sheath sewn into  place.  He was taken to the holding area in satisfactory clinical condition.   HEMODYNAMIC DATA:  1. Central aortic pressure was 107/71, mean 87.  2. Left ventricle was 109/23.  3. No gradient on pull back across the aortic valve.   ANGIOGRAPHIC DATA:  1. Ventriculography was done in the RAO projection.  There was minimal, if     any, inferobasal hypokinesis noted.  Overall systolic function was well     preserved with an ejection fraction estimated at 60%.  2. The left main was free of significant disease.  3. The left anterior descending is a somewhat diffusely irregular vessel.     There is probably 40% proximal narrowing.  The vessel provides a moderate     sized diagonal branch distally that has a fair amount of irregularity     with a 50% area of narrowing at the origin of one of the subbranches.     Distally,  the left anterior descending trifurcates into three small     branches.  4. The circumflex is a fairly sizable vessel.  There is diffuse irregularity     proximally with about 30% area of segmental disease.  This is prior to     the origin of a large marginal branch.  Distally, the vessel provides a     posterolateral system, and just prior to this is an 80% eccentric     stenosis.  There is collateralization to the distal right coronary artery     which appears to be a fair sized posterior descending branch.  5. The right coronary artery is totally occluded proximally.  After     reperfusion, there is a segmental area of disease followed by an area of     ruptured plaque.  This entire area was covered with a 33 x 30 Cypher drug-     eluding stent and a 8 x 30 Cypher drug-eluding stent and then post-     dilated to 3.5.  Distal to this, there is some segmental irregularities     of about 20%.  The stented area was reduced to 0%, with re-establishment     of TIMI III flow.  The distal vessel consisted of a large posterior     descending branch and a posterior lateral system.   CONCLUSIONS:  Inferior wall infarction with successful reperfusion using  Cypher drug-eluding stents.   DISPOSITION:  The patient was randomized in the Tritan study during the  trial and received Tritan drug versus Plavix in a blinded fashion according  to the protocol.  He will need aspirin plus study drug for a minimum of six  months, and preferably for one year.  Followup will be in [place].                                               Loretha Brasil. Lia Foyer, M.D. [facility]    TDS/MEDQ  D:  06/04/2004  T:  06/06/2004  Job:  [id number]   cc:   Jacqulyn Ducking, M.D.   Evie Lacks. Plotnikov, M.D. [facility]   CV Laboratory

## 2011-03-18 NOTE — H&P (Signed)
NAME:  Scott Oneal, Scott Oneal                           ACCOUNT NO.:  [id number]   MEDICAL RECORD NO.:  [id number]                   PATIENT TYPE:  INP   LOCATION:  6524                                 FACILITY:  [place]   PHYSICIAN:  Jacqulyn Ducking, M.D.               DATE OF BIRTH:  1941-06-19   DATE OF ADMISSION:  06/04/2004  DATE OF DISCHARGE:                                HISTORY & PHYSICAL   PRIMARY CARE PHYSICIAN:  Evie Lacks. Plotnikov, M.D.   HISTORY OF PRESENT ILLNESS:  A 70 year old gentleman with a history of  hypertension and hyperlipidemia who presents with typical ischemic symptoms.  Mr. Gremillion has no known cardiac disease.  He has had hypertension for the  past few years that has been well treated with medication.  This was  previously noted primarily during exertion rather than at rest.  He has also  had hyperlipidemia treated with a Statin.  He has taken a daily aspirin.  He  had never previously experienced cardiac symptoms.  He performed treadmill  stress test as part of an annual physical until a few years ago.  He has  never had any other cardiac testing.   Problems began at precisely 8:30 this evening while the patient was mowing  his lawn with a push mower.  He developed severe aching in his jaw  bilaterally associated with diaphoresis and nausea.  There was some dyspnea  as well.  He had moderately severe chest tightness.  He barely was able to  walk to the house where he called his son, who is an EMT.  Evaluation in the  emergency department was advised.  He continued to have symptoms in the ED,  but these gradually faded after IV nitroglycerin and heparin were started.   At the present time, he feels fairly well.  He is experiencing dizziness,  sense of presyncope, and nausea when he moves his head.   PAST MEDICAL HISTORY:  Otherwise relatively benign.  He has hypothyroidism  that has been treated with replacement therapy.  He has never previously  been  hospitalized nor undergone any surgery.  He has gout that has been  controlled with Allopurinol.   ALLERGIES:  The patient denies any medical allergies.   CURRENT MEDICATIONS:  1. Diovan 75 mg q.d.  2. Levothyroxine 0.075 mg q.d.  3. Atorvastatin 10 mg q.d.  4. Allopurinol 300 mg q.d.  5. Multivitamin.  6. Aspirin 81 mg q.d.  7. Foltx 1 q.d.   SOCIAL HISTORY:  Lives in [place] with his wife.  Works as an Chief Financial Officer.  No tobacco nor alcohol use.   FAMILY HISTORY:  Negative for coronary disease.   REVIEW OF SYMPTOMS:  All negative except as noted above.   PHYSICAL EXAMINATION:  GENERAL:  Pleasant mildly overweight gentleman in no  acute distress.  VITAL SIGNS:  The heart rate is 59 regular, blood pressure 105/60,  respirations 16.  Afebrile.  HEENT:  Anicteric sclerae.  Normal lids and conjunctivae.  NECK:  No jugular venous distention.  No carotid bruits.  ENDOCRINE:  No thyromegaly.  HEMATOPOIETIC:  No cervical, axillary, or inguinal adenopathy.  LUNGS:  Clear.  CARDIOVASCULAR:  Normal first and second heart sounds.  Fourth heart sound  present.  ABDOMEN:  Soft and nontender.  No bruits.  No masses.  No organomegaly.  EXTREMITIES:  Decreased right dorsalis pedis pulse.  Other pulses normal.  No edema.  NEUROLOGICAL:  Symmetric strength and tone.  MUSCULOSKELETAL:  No joint deformities.   LABORATORY DATA:  Initial EKG:  Non-sinus atrial rhythm with a short PR  interval, ST segment elevation in the inferior leads with reciprocal  depression in leads I, L, and V2.  Her repeat tracing 20 minutes later was  essentially unchanged-there may have been slight improvement in ST segment  elevation.  Another repeat tracing one hour after ER arrival reveals  significant improvement in ST segment elevation with decreased, but clearly  present, ST segment depression in leads I and L.   IMPRESSION:  Mr. Malina presents with multiple cardiovascular risk factors,  classic ischemic  symptoms, and significant electrocardiogram abnormalities.  His initial cardiac markers are negative, but this is certainly an acute  ischemic syndrome.  Intravenous Integrilin will be added to heparin and  nitroglycerin.  Beta blocker will be withheld due to bradycardia.  His ARB  and statin will be continued as well as aspirin.  The risks and benefits of  coronary angiography and possible percutaneous intervention were discussed  with the patient and his wife.  They agreed to proceed later in the day.   Otherwise, the patient has been quite diligent about control of  cardiovascular risk factors.  There may be some room for increased dietary  management and weight loss.  We will address those issues once those acute  problems are adequately managed.                                                Jacqulyn Ducking, M.D.    RR/MEDQ  D:  06/04/2004  T:  06/04/2004  Job:  [id number]

## 2011-03-18 NOTE — Assessment & Plan Note (Signed)
[facility]                           GASTROENTEROLOGY OFFICE NOTE   CATALDO, COSGRIFF                        MRN:          [id number]  DATE:05/11/2006                            DOB:          September 09, 1941    [name] is a 70 year old white male who returns for followup of  adenomatous colon polyp.  Prior colonoscopy performed in May, 2002 showed  six colon polyps which were adenomatous, and he was recommended to return  for recall colonoscopy at two years, in May, 2004.  He did not return.  He  states he had cardiac problems and put off his colonoscopy.  He has no  gastrointestinal complaints whatsoever.  He denies any abdominal pain,  rectal pain, change in bowel habits, diarrhea, constipation, melena,  hematochezia, or change in his stool caliber.  No family history of colon  cancer, colon polyps, or inflammatory bowel disease.  He is status post  myocardial infarction, and he has a Cypher stent in place.  He has been  included in the Triton study and is followed by Dr. Lia Foyer.  His stents  were placed in August of 2005, and he has done well from a cardiac  standpoint since that time.   PAST MEDICAL HISTORY:  1.  Hypertension.  2.  Status post inferior myocardial infarction, August, 2005.  3.  Status post coronary artery stent placement, August, 2005.  4.  Coronary artery disease.  5.  Hyperlipidemia.  6.  History of thrombocytopenia.  7.  Adenomatous colon polyps.  8.  Knee arthritis.  9.  Cholelithiasis.  10. Nephrolithiasis.  11. Gout.  12. Erectile dysfunction.  13. Hypothyroidism.   Current medications listed on the chart are updated and reviewed.   MEDICATION ALLERGIES:  NEOSPORIN.   Social history and review of systems are per handwritten form.   PHYSICAL EXAMINATION:  VITAL SIGNS:  Height 6 feet.  Weight 217.4 pounds.  Blood pressure 120/72, pulse 52 and regular.  GENERAL:  No acute distress.  HEENT:  Anicteric sclerae.   Oropharynx clear.  CHEST:  Clear to auscultation bilaterally.  CARDIAC:  Regular rate and rhythm without murmurs appreciated.  ABDOMEN:  Soft, nontender, nondistended.  Normoactive bowel sounds.  No  palpable organomegaly, masses, or hernias.  RECTAL:  Deferred at the time of colonoscopy.  EXTREMITIES:  Without clubbing, cyanosis or edema.  NEUROLOGIC:  Alert and oriented x3.  Grossly nonfocal.   ASSESSMENT/PLAN:  A personal history of multiple adenomatous colon polyps.  He is overdue for recall colonoscopy.  Given his drug-eluting stents and  current use of Plavix and aspirin, we will ask Dr. Lia Foyer to evaluate or  recommendation of holding Plavix for five days prior to the colonoscopy and  continuing aspirin 81 mg throughout the colonoscopy.  Risks, benefits and  alternatives of colonoscopy with possible biopsy and possible polypectomy  were discussed with the patient.  He consents to proceed.  This will be  scheduled electively.  Scott Oneal. Dagoberto Ligas., MD, [facility]   MTS/MedQ  DD:  05/12/2006  DT:  05/12/2006  Job #:  [id number]   cc:   Loretha Brasil. Lia Foyer, MD, [facility]

## 2011-03-18 NOTE — Cardiovascular Report (Signed)
NAME:  Scott Oneal, Scott Oneal                           ACCOUNT NO.:  [id number]   MEDICAL RECORD NO.:  [id number]                   PATIENT TYPE:  INP   LOCATION:  6532                                 FACILITY:  [facility]   PHYSICIAN:  Eustace Quail, M.D. [facility]              DATE OF BIRTH:  [birth year]   DATE OF PROCEDURE:  06/07/2004  DATE OF DISCHARGE:  06/08/2004                              CARDIAC CATHETERIZATION   PROCEDURE:  Percutaneous coronary intervention.   CLINICAL HISTORY:  Mr. Cedrone is 70 years old and works as an Chief Financial Officer for  [employer].  He had no prior history of heart disease and was admitted  on August 4 with an acute diaphragmatic wall infarction treated with tandem  overlying Cypher stents in the right coronary artery by Dr. Lia Foyer.  He had  residual disease in the distal circumflex artery and was brought back today  for intervention on the circumflex artery.   PROCEDURE:  The procedure was performed via the left femoral artery using an  arterial sheath and 6 French CLS 4 guiding catheter with side holes.  We  first took diagnostic pictures of the right coronary artery to assess the  results of the recently-placed stent.  The patient was given Angiomax bolus  and infusion and had been on Plavix.  We used a PT2 light support wire  because of tortuosity in the circumflex prior to the lesion.  We passed the  wire down several bends and then across the lesion into the distal  circumflex artery.  We pre-dilated with a 2.5 x 12 mm Voyager, performing  one inflation of eight atmospheres for 30 seconds.  We then passed a 2.5 x 8  mm Taxus stent to the distal chest x-ray artery right before it trifurcated.  We positioned the distal edge of the stent right at the trifurcation and  deployed this with one inflation of 14 atmospheres for 30 seconds.  Repeat  diagnostic was then performed through the guiding catheter.  The patient  tolerated the procedure well and left the laboratory in  satisfactory  condition.   RESULTS:  Initially the stenosis in the distal circumflex artery was  estimated at 80%.  Following stenting, this improved to 0%.   CONCLUSION:  Successful stenting of the lesion in the distal circumflex  artery with a Cypher stent, with improvement in the sentinel narrowing from  80% to 0%.   DISPOSITION:  The patient was returned to the __________ for further  observation.                                               Eustace Quail, M.D. [facility]    BB/MEDQ  D:  06/07/2004  T:  06/08/2004  Job:  [id number]   cc:  Evie Lacks. Plotnikov, M.D. [facility]   Jacqulyn Ducking, M.D.   Loretha Brasil. Lia Foyer, M.D. [facility]   Cardiopulmonary Lab

## 2011-03-18 NOTE — Discharge Summary (Signed)
NAME:  Scott Oneal, Scott Oneal                           ACCOUNT NO.:  [id number]   MEDICAL RECORD NO.:  [id number]                   PATIENT TYPE:  INP   LOCATION:  5449                                 FACILITY:  [facility]   PHYSICIAN:  Jacqulyn Ducking, M.D.               DATE OF BIRTH:  03-27-1941   DATE OF ADMISSION:  06/04/2004  DATE OF DISCHARGE:  06/08/2004                                 DISCHARGE SUMMARY   ADDENDUM TO DISCHARGE SUMMARY:  Correction on the Lipitor dose.  [name]  goes home on Lipitor 20 mg at bedtime daily.  It had been said that he was  on 10 mg this is an error, 20 mg daily.      Sueanne Margarita, P.A.                    Jacqulyn Ducking, M.D.    GM/MEDQ  D:  06/08/2004  T:  06/08/2004  Job:  [id number]   cc:   Loretha Brasil. Lia Foyer, M.D. [place] [facility] Plotnikov, M.D. [facility]

## 2011-04-20 ENCOUNTER — Telehealth: Payer: Self-pay | Admitting: *Deleted

## 2011-04-20 NOTE — Telephone Encounter (Signed)
Pt states that he has been having chills/sweats for several days and woke up this morning w/red circles on back; feels it may be insect bites.  Called Pt back to triage symptoms: Pt has fever 100.5, body aches, chills/sweats for 3-4 days, hurting up right side & shoulder, red circles on back w/prominent inner & outer rings                                                           Pt has NO respiratory involvement [SOB], NO chest and/or arm pain Informed Pt this sounds very much like Tick Bite[s] and made appt for Thurs, 04/21/11 @02 :30pm. Informed Pt that if any of the above mentioned symptoms get worse OR if there is any respiratory and/or chest/arm pain involvement before his OV tomorrow to seek evaluation & assessment immediately at [facility] or ED. Pt agreed.

## 2011-04-20 NOTE — Telephone Encounter (Signed)
Agree w/ov thx

## 2011-04-21 ENCOUNTER — Encounter: Payer: Self-pay | Admitting: *Deleted

## 2011-04-21 ENCOUNTER — Ambulatory Visit (INDEPENDENT_AMBULATORY_CARE_PROVIDER_SITE_OTHER): Payer: Medicare Other | Admitting: Internal Medicine

## 2011-04-21 ENCOUNTER — Telehealth: Payer: Self-pay | Admitting: *Deleted

## 2011-04-21 ENCOUNTER — Other Ambulatory Visit (INDEPENDENT_AMBULATORY_CARE_PROVIDER_SITE_OTHER): Payer: Medicare Other

## 2011-04-21 ENCOUNTER — Encounter: Payer: Self-pay | Admitting: Internal Medicine

## 2011-04-21 ENCOUNTER — Other Ambulatory Visit (INDEPENDENT_AMBULATORY_CARE_PROVIDER_SITE_OTHER): Payer: Medicare Other | Admitting: Internal Medicine

## 2011-04-21 VITALS — BP 150/80 | HR 76 | Temp 99.0°F | Resp 16 | Ht 72.0 in | Wt 207.0 lb

## 2011-04-21 DIAGNOSIS — L03319 Cellulitis of trunk, unspecified: Secondary | ICD-10-CM

## 2011-04-21 DIAGNOSIS — L02219 Cutaneous abscess of trunk, unspecified: Secondary | ICD-10-CM

## 2011-04-21 DIAGNOSIS — R6883 Chills (without fever): Secondary | ICD-10-CM

## 2011-04-21 LAB — COMPREHENSIVE METABOLIC PANEL
Albumin: 3.9 g/dL (ref 3.5–5.2)
BUN: 18 mg/dL (ref 6–23)
CO2: 34 mEq/L — ABNORMAL HIGH (ref 19–32)
Calcium: 9 mg/dL (ref 8.4–10.5)
Chloride: 103 mEq/L (ref 96–112)
GFR: 42.33 mL/min — ABNORMAL LOW (ref >60.00)
Glucose, Bld: 106 mg/dL — ABNORMAL HIGH (ref 70–99)
Potassium: 5 mEq/L (ref 3.5–5.1)
Sodium: 140 mEq/L (ref 135–145)
Total Protein: 6.1 g/dL (ref 6.0–8.3)

## 2011-04-21 LAB — SEDIMENTATION RATE: Sed Rate: 14 mm/hr (ref 0–22)

## 2011-04-21 LAB — CBC WITH DIFFERENTIAL/PLATELET
Basophils Relative: 0.4 % (ref 0.0–3.0)
Eosinophils Relative: 3.7 % (ref 0.0–5.0)
HCT: 45.4 % (ref 39.0–52.0)
MCV: 94.5 fl (ref 78.0–100.0)
Monocytes Relative: 14.2 % — ABNORMAL HIGH (ref 3.0–12.0)
Neutrophils Relative %: 66.1 % (ref 43.0–77.0)
Platelets: 89 10*3/uL — ABNORMAL LOW (ref 150.0–400.0)
RBC: 4.81 Mil/uL (ref 4.22–5.81)
WBC: 4.4 10*3/uL — ABNORMAL LOW (ref 4.5–10.5)

## 2011-04-21 LAB — URINALYSIS, ROUTINE W REFLEX MICROSCOPIC
Bilirubin Urine: NEGATIVE
Hgb urine dipstick: NEGATIVE
Ketones, ur: NEGATIVE
Leukocytes, UA: NEGATIVE
Nitrite: NEGATIVE
Urobilinogen, UA: 1 (ref 0.0–1.0)
pH: 6 (ref 5.0–8.0)

## 2011-04-21 MED ORDER — DOXYCYCLINE HYCLATE 100 MG PO TABS: 100.0000 mg | ORAL_TABLET | Freq: Two times a day (BID) | ORAL | Status: AC

## 2011-04-21 MED ORDER — CEFTRIAXONE SODIUM 1 G IJ SOLR
1.0000 g | Freq: Once | INTRAMUSCULAR | Status: AC
  Administered 2011-04-21: 1 g via INTRAMUSCULAR

## 2011-04-21 NOTE — Telephone Encounter (Signed)
Same Day Abstraction.

## 2011-04-21 NOTE — Assessment & Plan Note (Signed)
Rocephin Labs Doxy x 2 wks

## 2011-04-21 NOTE — Progress Notes (Signed)
  Subjective:    Patient ID: Scott Oneal, male    DOB: 19-Oct-1941, 70 y.o.   MRN: [id number]  HPI  C/o chills, fatigue and aches. He noticed rash on his back yesturday. Feeling bad.  Review of Systems  Constitutional: Positive for fever, chills, diaphoresis and fatigue. Negative for appetite change and unexpected weight change.  HENT: Negative for nosebleeds, congestion, sore throat, sneezing, trouble swallowing and neck pain.   Eyes: Negative for itching and visual disturbance.  Respiratory: Negative for cough.   Cardiovascular: Negative for chest pain, palpitations and leg swelling.  Gastrointestinal: Negative for nausea, abdominal pain, diarrhea, blood in stool and abdominal distention.  Genitourinary: Negative for frequency and hematuria.  Musculoskeletal: Positive for back pain (left). Negative for joint swelling and gait problem.  Skin: Negative for rash.  Neurological: Negative for dizziness, tremors, speech difficulty and weakness.  Psychiatric/Behavioral: Negative for sleep disturbance, dysphoric mood and agitation. The patient is not nervous/anxious.        Objective:   Physical Exam  Constitutional: He is oriented to person, place, and time. He appears well-developed.       NAD Tired eyes  HENT:  Mouth/Throat: Oropharynx is clear and moist.  Eyes: Conjunctivae are normal. Pupils are equal, round, and reactive to light.  Neck: Normal range of motion. No JVD present. No thyromegaly present.  Cardiovascular: Normal rate, regular rhythm, normal heart sounds and intact distal pulses.  Exam reveals no gallop and no friction rub.   No murmur heard. Pulmonary/Chest: Effort normal and breath sounds normal. No respiratory distress. He has no wheezes. He has no rales. He exhibits no tenderness.  Abdominal: Soft. Bowel sounds are normal. He exhibits no distension and no mass. There is no tenderness. There is no rebound (L lower spine with a large round 20 cm hot erythema with a 5-6  cm purplish swelling in the center) and no guarding.  Musculoskeletal: Normal range of motion. He exhibits tenderness (l ls spine where rash is tender). He exhibits no edema.  Lymphadenopathy:    He has no cervical adenopathy.  Neurological: He is alert and oriented to person, place, and time. He has normal reflexes. No cranial nerve deficit. He exhibits normal muscle tone. Coordination normal.  Skin: Skin is warm and dry. Rash noted. There is erythema.  Psychiatric: He has a normal mood and affect. His behavior is normal. Judgment and thought content normal.          Assessment & Plan:  Procedure Note :    Procedure :   Sonography examination   Indication: L flank redness and swelling, r/o abscess   Equipment used: Sonosite M-Turbo with  HFL38x/13-6 MHz transducer linear probe. The images were stored in the unit and later transferred in storage.  The patient was placed in a decubitus position.   This study revealed a soft tissue edema in the center of the area of erythema - no abscess present.   Impression: phlegmon, L flank posterior aspect   A complex case. To ER if worse. IM Rocephin. PO abx

## 2011-04-22 NOTE — Telephone Encounter (Signed)
Pt seen in office 04/21/11

## 2011-04-24 ENCOUNTER — Encounter: Payer: Self-pay | Admitting: Internal Medicine

## 2011-04-24 DIAGNOSIS — R6883 Chills (without fever): Secondary | ICD-10-CM | POA: Insufficient documentation

## 2011-04-25 ENCOUNTER — Telehealth: Payer: Self-pay | Admitting: Internal Medicine

## 2011-04-27 ENCOUNTER — Encounter: Payer: Self-pay | Admitting: Internal Medicine

## 2011-04-28 ENCOUNTER — Encounter: Payer: Self-pay | Admitting: Internal Medicine

## 2011-04-28 ENCOUNTER — Ambulatory Visit (INDEPENDENT_AMBULATORY_CARE_PROVIDER_SITE_OTHER): Payer: Medicare Other | Admitting: Internal Medicine

## 2011-04-28 DIAGNOSIS — L02219 Cutaneous abscess of trunk, unspecified: Secondary | ICD-10-CM

## 2011-04-28 DIAGNOSIS — L03319 Cellulitis of trunk, unspecified: Secondary | ICD-10-CM

## 2011-04-28 DIAGNOSIS — R5381 Other malaise: Secondary | ICD-10-CM

## 2011-04-28 DIAGNOSIS — I10 Essential (primary) hypertension: Secondary | ICD-10-CM

## 2011-04-28 DIAGNOSIS — R209 Unspecified disturbances of skin sensation: Secondary | ICD-10-CM

## 2011-04-28 DIAGNOSIS — R200 Anesthesia of skin: Secondary | ICD-10-CM

## 2011-04-28 DIAGNOSIS — R5383 Other fatigue: Secondary | ICD-10-CM

## 2011-04-28 NOTE — Progress Notes (Signed)
  Subjective:    Patient ID: Scott Oneal, male    DOB: 08/03/1941, 70 y.o.   MRN: [id number]  HPI  F/u cellulitis L lower back C/o fatigue from BP meds He spraypainted his trailer - c/o numb index and thumb   Review of Systems  Constitutional: Negative for fever and chills.  Respiratory: Negative for cough and shortness of breath.   Gastrointestinal: Negative for abdominal pain.  Musculoskeletal: Negative for back pain.  Neurological: Negative for numbness.   Wt Readings from Last 3 Encounters:  04/28/11 207 lb (93.895 kg)  04/21/11 207 lb (93.895 kg)  11/24/10 211 lb (95.709 kg)   BP Readings from Last 3 Encounters:  04/28/11 130/60  04/21/11 150/80  11/24/10 150/90        Objective:   Physical Exam  Constitutional: He is oriented to person, place, and time. He appears well-developed. No distress.  HENT:  Mouth/Throat: Oropharynx is clear and moist.  Eyes: Conjunctivae are normal. Pupils are equal, round, and reactive to light.  Neck: Normal range of motion. No JVD present. No tracheal deviation present. No thyromegaly present.  Cardiovascular: Normal rate, regular rhythm, normal heart sounds and intact distal pulses.  Exam reveals no gallop and no friction rub.   No murmur heard. Pulmonary/Chest: Effort normal and breath sounds normal. No stridor. No respiratory distress. He has no wheezes. He has no rales. He exhibits no tenderness.  Abdominal: Soft. Bowel sounds are normal. He exhibits no distension and no mass. There is no tenderness. There is no rebound and no guarding.  Musculoskeletal: Normal range of motion. He exhibits no edema and no tenderness.  Lymphadenopathy:    He has no cervical adenopathy.  Neurological: He is alert and oriented to person, place, and time. He has normal reflexes. No cranial nerve deficit. He exhibits normal muscle tone. Coordination normal.  Skin: Skin is warm and dry. No rash noted. No erythema.  Psychiatric: He has a normal mood and  affect. His behavior is normal. Judgment and thought content normal.          Assessment & Plan:

## 2011-04-28 NOTE — Patient Instructions (Signed)
Try Bystolic 5 or 10 mg a day  In place of Coreg (carvedilol) to see if less tired on it

## 2011-04-28 NOTE — Assessment & Plan Note (Signed)
Will watch

## 2011-04-28 NOTE — Assessment & Plan Note (Signed)
Resolving

## 2011-04-28 NOTE — Assessment & Plan Note (Signed)
See Med changes

## 2011-05-18 ENCOUNTER — Other Ambulatory Visit: Payer: Self-pay | Admitting: Internal Medicine

## 2011-05-18 ENCOUNTER — Other Ambulatory Visit: Payer: Self-pay

## 2011-05-18 DIAGNOSIS — K900 Celiac disease: Secondary | ICD-10-CM

## 2011-05-18 DIAGNOSIS — E559 Vitamin D deficiency, unspecified: Secondary | ICD-10-CM

## 2011-05-25 ENCOUNTER — Ambulatory Visit: Payer: Self-pay | Admitting: Internal Medicine

## 2011-05-25 ENCOUNTER — Other Ambulatory Visit: Payer: Self-pay | Admitting: *Deleted

## 2011-05-25 MED ORDER — CLOPIDOGREL BISULFATE 75 MG PO TABS: 75.0000 mg | ORAL_TABLET | Freq: Every day | ORAL | Status: DC

## 2011-07-27 ENCOUNTER — Other Ambulatory Visit (INDEPENDENT_AMBULATORY_CARE_PROVIDER_SITE_OTHER): Payer: Medicare Other

## 2011-07-27 ENCOUNTER — Telehealth: Payer: Self-pay | Admitting: Internal Medicine

## 2011-07-27 ENCOUNTER — Ambulatory Visit (INDEPENDENT_AMBULATORY_CARE_PROVIDER_SITE_OTHER): Payer: Medicare Other | Admitting: *Deleted

## 2011-07-27 DIAGNOSIS — Z79899 Other long term (current) drug therapy: Secondary | ICD-10-CM

## 2011-07-27 DIAGNOSIS — E039 Hypothyroidism, unspecified: Secondary | ICD-10-CM

## 2011-07-27 DIAGNOSIS — Z23 Encounter for immunization: Secondary | ICD-10-CM

## 2011-07-27 DIAGNOSIS — K900 Celiac disease: Secondary | ICD-10-CM

## 2011-07-27 DIAGNOSIS — N189 Chronic kidney disease, unspecified: Secondary | ICD-10-CM

## 2011-07-27 DIAGNOSIS — R5383 Other fatigue: Secondary | ICD-10-CM

## 2011-07-27 DIAGNOSIS — E559 Vitamin D deficiency, unspecified: Secondary | ICD-10-CM

## 2011-07-27 LAB — CBC WITH DIFFERENTIAL/PLATELET
Basophils Absolute: 0 10*3/uL (ref 0.0–0.1)
Basophils Relative: 0.6 % (ref 0.0–3.0)
Eosinophils Absolute: 0.1 10*3/uL (ref 0.0–0.7)
Eosinophils Relative: 2 % (ref 0.0–5.0)
HCT: 47.9 % (ref 39.0–52.0)
Hemoglobin: 15.6 g/dL (ref 13.0–17.0)
Lymphocytes Relative: 22 % (ref 12.0–46.0)
Lymphs Abs: 1.2 10*3/uL (ref 0.7–4.0)
MCHC: 32.6 g/dL (ref 30.0–36.0)
MCV: 96.7 fl (ref 78.0–100.0)
Monocytes Absolute: 0.5 10*3/uL (ref 0.1–1.0)
Monocytes Relative: 9.5 % (ref 3.0–12.0)
Neutro Abs: 3.7 10*3/uL (ref 1.4–7.7)
Neutrophils Relative %: 65.9 % (ref 43.0–77.0)
Platelets: 140 10*3/uL — ABNORMAL LOW (ref 150.0–400.0)
RBC: 4.95 Mil/uL (ref 4.22–5.81)
RDW: 16.8 % — ABNORMAL HIGH (ref 11.5–14.6)
WBC: 5.6 10*3/uL (ref 4.5–10.5)

## 2011-07-27 LAB — BASIC METABOLIC PANEL
BUN: 17 mg/dL (ref 6–23)
Calcium: 10 mg/dL (ref 8.4–10.5)
Chloride: 105 mEq/L (ref 96–112)
Creatinine, Ser: 1.8 mg/dL — ABNORMAL HIGH (ref 0.4–1.5)
GFR: 39.36 mL/min — ABNORMAL LOW (ref >60.00)

## 2011-07-27 LAB — TSH: TSH: 4.34 u[IU]/mL (ref 0.35–5.50)

## 2011-07-27 LAB — VITAMIN D 25 HYDROXY (VIT D DEFICIENCY, FRACTURES): Vit D, 25-Hydroxy: 41 ng/mL (ref 30–89)

## 2011-07-27 LAB — VITAMIN B12: Vitamin B-12: 893 pg/mL (ref 211–911)

## 2011-07-27 NOTE — Telephone Encounter (Signed)
Pt informed

## 2011-07-27 NOTE — Telephone Encounter (Signed)
Scott Oneal, please, inform patient that all labs are normal except for high K Stop Losartan Keep ROV Thx

## 2011-08-01 ENCOUNTER — Telehealth: Payer: Self-pay | Admitting: Internal Medicine

## 2011-08-01 ENCOUNTER — Encounter: Payer: Self-pay | Admitting: Internal Medicine

## 2011-08-01 ENCOUNTER — Ambulatory Visit (INDEPENDENT_AMBULATORY_CARE_PROVIDER_SITE_OTHER): Payer: Medicare Other | Admitting: Internal Medicine

## 2011-08-01 ENCOUNTER — Other Ambulatory Visit (INDEPENDENT_AMBULATORY_CARE_PROVIDER_SITE_OTHER): Payer: Medicare Other

## 2011-08-01 VITALS — BP 140/80 | HR 88 | Temp 98.6°F | Resp 16 | Wt 209.0 lb

## 2011-08-01 DIAGNOSIS — I10 Essential (primary) hypertension: Secondary | ICD-10-CM

## 2011-08-01 DIAGNOSIS — N259 Disorder resulting from impaired renal tubular function, unspecified: Secondary | ICD-10-CM

## 2011-08-01 DIAGNOSIS — G5690 Unspecified mononeuropathy of unspecified upper limb: Secondary | ICD-10-CM

## 2011-08-01 DIAGNOSIS — E875 Hyperkalemia: Secondary | ICD-10-CM

## 2011-08-01 DIAGNOSIS — R5383 Other fatigue: Secondary | ICD-10-CM

## 2011-08-01 DIAGNOSIS — E039 Hypothyroidism, unspecified: Secondary | ICD-10-CM

## 2011-08-01 DIAGNOSIS — K900 Celiac disease: Secondary | ICD-10-CM

## 2011-08-01 DIAGNOSIS — R5381 Other malaise: Secondary | ICD-10-CM

## 2011-08-01 DIAGNOSIS — E291 Testicular hypofunction: Secondary | ICD-10-CM

## 2011-08-01 LAB — BASIC METABOLIC PANEL
BUN: 18 mg/dL (ref 6–23)
Chloride: 103 mEq/L (ref 96–112)
GFR: 43.77 mL/min — ABNORMAL LOW (ref >60.00)
Potassium: 5.1 mEq/L (ref 3.5–5.1)
Sodium: 140 mEq/L (ref 135–145)

## 2011-08-01 MED ORDER — CARVEDILOL 6.25 MG PO TABS: 6.2500 mg | ORAL_TABLET | Freq: Two times a day (BID) | ORAL | Status: DC

## 2011-08-01 NOTE — Progress Notes (Signed)
  Subjective:    Patient ID: Scott Oneal, male    DOB: 04-30-1941, 70 y.o.   MRN: [id number]  HPI The patient presents for a follow-up of  chronic CAD, hypertension, chronic dyslipidemia, hypogonadism controlled with medicines. F/u elev K.      Review of Systems  Constitutional: Negative for appetite change, fatigue and unexpected weight change.  HENT: Negative for nosebleeds, congestion, sore throat, sneezing, trouble swallowing and neck pain.   Eyes: Negative for itching and visual disturbance.  Respiratory: Negative for cough.   Cardiovascular: Negative for chest pain, palpitations and leg swelling.  Gastrointestinal: Positive for diarrhea. Negative for nausea, blood in stool and abdominal distention.  Genitourinary: Negative for frequency and hematuria.  Musculoskeletal: Negative for back pain, joint swelling and gait problem.  Skin: Negative for rash.  Neurological: Negative for dizziness, tremors, speech difficulty and weakness.  Psychiatric/Behavioral: Negative for sleep disturbance, dysphoric mood and agitation. The patient is nervous/anxious.        Objective:   Physical Exam  Constitutional: He is oriented to person, place, and time. He appears well-developed.  HENT:  Mouth/Throat: Oropharynx is clear and moist.  Eyes: Conjunctivae are normal. Pupils are equal, round, and reactive to light.  Neck: Normal range of motion. No JVD present. No thyromegaly present.  Cardiovascular: Normal rate, regular rhythm, normal heart sounds and intact distal pulses.  Exam reveals no gallop and no friction rub.   No murmur heard. Pulmonary/Chest: Effort normal and breath sounds normal. No respiratory distress. He has no wheezes. He has no rales. He exhibits no tenderness.  Abdominal: Soft. Bowel sounds are normal. He exhibits no distension and no mass. There is no tenderness. There is no rebound and no guarding.  Musculoskeletal: Normal range of motion. He exhibits no edema and no  tenderness.  Lymphadenopathy:    He has no cervical adenopathy.  Neurological: He is alert and oriented to person, place, and time. He has normal reflexes. No cranial nerve deficit. He exhibits normal muscle tone. Coordination normal.  Skin: Skin is warm and dry. No rash noted.  Psychiatric: His behavior is normal. Judgment and thought content normal.     Lab Results  Component Value Date   WBC 5.6 07/27/2011   HGB 15.6 07/27/2011   HCT 47.9 07/27/2011   PLT 140.0* 07/27/2011   CHOL 140 11/17/2010   TRIG 82.0 11/17/2010   HDL 34.50* 11/17/2010   ALT 73* 04/21/2011   AST 58* 04/21/2011   NA 144 07/27/2011   K 5.9* 07/27/2011   CL 105 07/27/2011   CREATININE 1.8* 07/27/2011   BUN 17 07/27/2011   CO2 32 07/27/2011   TSH 4.34 07/27/2011   PSA 2.00 11/17/2010   HGBA1C 5.4 05/05/2009        Assessment & Plan:

## 2011-08-01 NOTE — Telephone Encounter (Signed)
Scott Oneal, please, inform patient that his Potassium is better Thx

## 2011-08-02 NOTE — Telephone Encounter (Signed)
Left detailed mess informing pt of below.  

## 2011-08-03 ENCOUNTER — Other Ambulatory Visit: Payer: Self-pay | Admitting: *Deleted

## 2011-08-03 MED ORDER — CALCITRIOL 0.25 MCG PO CAPS: 0.2500 ug | ORAL_CAPSULE | Freq: Every day | ORAL | Status: DC

## 2011-08-03 MED ORDER — SIMVASTATIN 10 MG PO TABS: 10.0000 mg | ORAL_TABLET | Freq: Every day | ORAL | Status: DC

## 2011-08-06 NOTE — Assessment & Plan Note (Signed)
Discussed.

## 2011-08-06 NOTE — Assessment & Plan Note (Signed)
Continue with current prescription therapy as reflected on the Med list.  

## 2011-08-06 NOTE — Assessment & Plan Note (Signed)
Continue with current prescription therapy and BP control as reflected on the Med list.

## 2011-08-06 NOTE — Assessment & Plan Note (Signed)
Labs reviewed See meds

## 2011-08-15 LAB — BASIC METABOLIC PANEL
GFR calc non Af Amer: 42 — ABNORMAL LOW
Glucose, Bld: 103 — ABNORMAL HIGH
Potassium: 4.8
Sodium: 143

## 2011-08-15 LAB — CBC
HCT: 38 — ABNORMAL LOW
Hemoglobin: 12.9 — ABNORMAL LOW
RBC: 4.24
RDW: 15 — ABNORMAL HIGH
WBC: 4.4

## 2011-08-31 ENCOUNTER — Telehealth: Payer: Self-pay | Admitting: *Deleted

## 2011-08-31 MED ORDER — TESTOSTERONE CYPIONATE 200 MG/ML IM SOLN: 400.0000 mg | INTRAMUSCULAR | Status: DC

## 2011-08-31 NOTE — Telephone Encounter (Signed)
OK to fill this prescription with additional refills x5 Thank you!  

## 2011-08-31 NOTE — Telephone Encounter (Signed)
Rf req for testoster cyp 200 mg/ml. inj 400 mg q 14 days. Ok to Rf?

## 2011-09-01 ENCOUNTER — Ambulatory Visit: Payer: Medicare Other | Admitting: Internal Medicine

## 2011-10-05 ENCOUNTER — Encounter: Payer: Self-pay | Admitting: Cardiology

## 2011-10-05 ENCOUNTER — Ambulatory Visit (INDEPENDENT_AMBULATORY_CARE_PROVIDER_SITE_OTHER): Payer: Medicare Other | Admitting: Cardiology

## 2011-10-05 VITALS — BP 140/88 | HR 60 | Ht 72.0 in | Wt 214.8 lb

## 2011-10-05 DIAGNOSIS — I10 Essential (primary) hypertension: Secondary | ICD-10-CM

## 2011-10-05 DIAGNOSIS — E7800 Pure hypercholesterolemia, unspecified: Secondary | ICD-10-CM

## 2011-10-05 DIAGNOSIS — I2510 Atherosclerotic heart disease of native coronary artery without angina pectoris: Secondary | ICD-10-CM

## 2011-10-05 NOTE — Patient Instructions (Signed)
Your physician has requested that you have an exercise tolerance test (1st available). For further information please visit [email or web address]. Please also follow instruction sheet, as given.  Your physician wants you to follow-up in: 1 YEAR with Dr Lia Foyer.  You will receive a reminder letter in the mail two months in advance. If you don't receive a letter, please call our office to schedule the follow-up appointment.  Your physician recommends that you continue on your current medications as directed. Please refer to the Current Medication list given to you today.

## 2011-10-05 NOTE — Progress Notes (Signed)
HPI:  Doing very well.  No chest pain.  Feels good.  Denies current symptoms. Has been travelling with his wife.  No bleeding, and his son has warned him about the risk, so he is now not getting on ladders, etc.  Feels good.  Sees [facility] next month for general exam annual.  Asked about having a gxt.   Current Outpatient Prescriptions  Medication Sig Dispense Refill  . aspirin 81 MG tablet Take 81 mg by mouth daily.        . calcitRIOL (ROCALTROL) 0.25 MCG capsule Take 1 capsule (0.25 mcg total) by mouth daily.  90 capsule  3  . carvedilol (COREG) 6.25 MG tablet Take 1 tablet (6.25 mg total) by mouth 2 (two) times daily.  60 tablet  11  . clopidogrel (PLAVIX) 75 MG tablet Take 1 tablet (75 mg total) by mouth daily.  90 tablet  1  . fish oil-omega-3 fatty acids 1000 MG capsule Take 1 g by mouth daily.        Marland Kitchen latanoprost (XALATAN) 0.005 % ophthalmic solution Place 1 drop into both eyes at bedtime.        Marland Kitchen levothyroxine (SYNTHROID, LEVOTHROID) 100 MCG tablet Take 100 mcg by mouth daily. For thyroid       . Multiple Vitamins-Minerals (MULTIVITAMIN,TX-MINERALS) tablet Take 1 tablet by mouth daily.        . nitroGLYCERIN (NITROSTAT) 0.4 MG SL tablet Place 0.4 mg under the tongue every 5 (five) minutes as needed.        . sildenafil (VIAGRA) 100 MG tablet Take 100 mg by mouth daily as needed.        . simvastatin (ZOCOR) 10 MG tablet Take 1 tablet (10 mg total) by mouth at bedtime.  90 tablet  3  . SYRINGE-NEEDLE, DISP, 3 ML (B-D INTEGRA SYRINGE) 23G X 1" 3 ML MISC by Does not apply route as directed. [place] for any brand, please use 23g 1 inch       . testosterone cypionate (DEPO-TESTOSTERONE) 200 MG/ML injection Inject 2 mLs (400 mg total) into the muscle every 14 (fourteen) days.  10 mL  5    Allergies  Allergen Reactions  . Metoprolol Tartrate     REACTION: fatigue  . Neosporin Lt     Past Medical History  Diagnosis Date  . Coronary atherosclerosis of unspecified type of vessel, native or  graft   . Old myocardial infarction   . Unspecified essential hypertension   . Pure hypercholesterolemia   . Encounter for long-term (current) use of anticoagulants   . Other specified cardiac dysrhythmias   . Esophageal reflux   . Other malaise and fatigue   . Other testicular hypofunction   . Personal history of colonic polyps   . Osteoporosis, unspecified   . Calculus of gallbladder without mention of cholecystitis   . Cholelithiasis   . Celiac disease   . Loss of weight   . Unspecified asthma   . Diarrhea   . Unspecified hypothyroidism   . Gout, unspecified   . Benign neoplasm of colon   . Renal insufficiency     chronic  . Hyperparathyroidism     Past Surgical History  Procedure Date  . Knee surgery     right  . Coronary stent placement 2011    Cypher; in distal circumflex artery  . Cystoscopy 1998    Family History  Problem Relation Age of Onset  . Hypertension    . Asthma Neg Hx   .  Colon cancer Neg Hx   . Lymphoma Father   . Cancer Father     lymphoma  . Vision loss Maternal Grandmother     History   Social History  . Marital Status: Married    Spouse Name: Avenir Lozinski    Number of Children: N/A  . Years of Education: N/A   Occupational History  . retired    Social History Main Topics  . Smoking status: Never Smoker   . Smokeless tobacco: Not on file  . Alcohol Use: No  . Drug Use: No  . Sexually Active: Yes   Other Topics Concern  . Not on file   Social History Narrative   Retired.Regular Exercise-Yes; yoga & silver sneakersDaily Caffeine Use.    ROS: Please see the HPI.  All other systems reviewed and negative.  PHYSICAL EXAM:  BP 140/88  Pulse 60  Ht 6' (1.829 m)  Wt 97.433 kg (214 lb 12.8 oz)  BMI 29.13 kg/m2  General: Well developed, well nourished, in no acute distress. Head:  Normocephalic and atraumatic. Neck: no JVD Lungs: Clear to auscultation and percussion. Heart: Normal S1 and S2.  No murmur, rubs or gallops.    Abdomen:  Normal bowel sounds; soft; non tender; no organomegaly Pulses: Pulses normal in all 4 extremities. Extremities: No clubbing or cyanosis. No edema. Neurologic: Alert and oriented x 3.  EKG:  NSR.  WNL.  ASSESSMENT AND PLAN:

## 2011-10-05 NOTE — Assessment & Plan Note (Signed)
Has follow up with Dr. Mamie Nick scheduled.  He gets his labs annually. Target should be LDL less than 97m/dl.

## 2011-10-05 NOTE — Progress Notes (Signed)
Patient ID: Scott Oneal, male   DOB: 1941-02-16, 70 y.o.   MRN: [id number]

## 2011-10-05 NOTE — Assessment & Plan Note (Addendum)
Stable.  Discussed DAPT again this year.  With first gen stent placed in setting of total occlusion, lean toward continued DAPT.  He prefers to do this.  Will readdress annually.  Has two cyphers in the RCA, and one in distal CFX.

## 2011-10-05 NOTE — Assessment & Plan Note (Signed)
Borderline control.

## 2011-10-21 ENCOUNTER — Other Ambulatory Visit: Payer: Self-pay | Admitting: *Deleted

## 2011-10-21 MED ORDER — CLOPIDOGREL BISULFATE 75 MG PO TABS: 75.0000 mg | ORAL_TABLET | Freq: Every day | ORAL | Status: DC

## 2011-10-21 NOTE — Telephone Encounter (Signed)
Pt needs rx for Plavix sent to [facility]. Rx sent, pt's spouse informed.

## 2011-11-28 ENCOUNTER — Ambulatory Visit (INDEPENDENT_AMBULATORY_CARE_PROVIDER_SITE_OTHER): Payer: Medicare Other | Admitting: Cardiology

## 2011-11-28 DIAGNOSIS — R0989 Other specified symptoms and signs involving the circulatory and respiratory systems: Secondary | ICD-10-CM

## 2011-11-28 DIAGNOSIS — I2510 Atherosclerotic heart disease of native coronary artery without angina pectoris: Secondary | ICD-10-CM

## 2011-11-28 NOTE — Progress Notes (Deleted)
Exercise Treadmill Test  Pre-Exercise Testing Evaluation Rhythm: normal sinus  Rate: 64   PR:  .13 QRS:  .08  QT:  .39 QTc: .40     Test  Exercise Tolerance Test Ordering MD: [name] Quarry, MD  Interpreting MD:  [name] Quarry, MD  Unique Test No: 1  Treadmill:  1  Indication for ETT: known ASHD  Contraindication to ETT: No   Stress Modality: exercise - treadmill  Cardiac Imaging Performed: non   Protocol: standard Bruce - maximal  Max BP:  ***/***  Max MPHR (bpm):  150 85% MPR (bpm):  128  MPHR obtained (bpm):  *** % MPHR obtained:  ***  Reached 85% MPHR (min:sec):  *** Total Exercise Time (min-sec):  ***  Workload in METS:  *** Borg Scale: ***  Reason ETT Terminated:  {CHL REASON TERMINATED FOR SEL:95320233}    ST Segment Analysis At Rest: {CHL ST SEGMENT AT REST FOR IDH:68616837} With Exercise: {CHL ST SEGMENT WITH EXERCISE FOR GBM:21115520}  Other Information Arrhythmia:  {CHL ARRHYTHMIA FOR EYE:23361224} Angina during ETT:  {CHL ANGINA DURING SLP:53005110} Quality of ETT:  {CHL QUALITY OF YTR:17356701}  ETT Interpretation:  {CHL INTERPRETATION FOR IDC:30131438}  Comments: ***  Recommendations: ***

## 2011-11-28 NOTE — Progress Notes (Signed)
Patient ID: Scott Oneal, male   DOB: Feb 09, 1941, 71 y.o.   MRN: [id number] He came in today for a stress test.  He is in good spirits, but says he has had intermittent left pulsating headache in the left temple.  He has not had a head cold, and denies nausea.  It is brief, intermittent, and 5 on scale of 10.  He does not feel all that bad.  His neuro exam is negative.  BP is 148/83.  P 64.  No drift. CN intact.  Full strength bilaterally.  No discoordination.    We elected not to do GXT.  I will call his primary MD.  I called his wife and she will pick him up.  If symptoms persist, then I think best option would be for a CT given chronic use of DAPT.    His wife is coming.    [name] Quarry 12:08 PM 11/28/2011

## 2011-11-29 ENCOUNTER — Encounter: Payer: Self-pay | Admitting: Internal Medicine

## 2011-11-29 ENCOUNTER — Ambulatory Visit (INDEPENDENT_AMBULATORY_CARE_PROVIDER_SITE_OTHER): Payer: Medicare Other | Admitting: Internal Medicine

## 2011-11-29 VITALS — BP 140/80 | HR 84 | Temp 98.3°F | Resp 16 | Wt 212.0 lb

## 2011-11-29 DIAGNOSIS — I2510 Atherosclerotic heart disease of native coronary artery without angina pectoris: Secondary | ICD-10-CM

## 2011-11-29 DIAGNOSIS — R51 Headache: Secondary | ICD-10-CM

## 2011-11-29 DIAGNOSIS — I10 Essential (primary) hypertension: Secondary | ICD-10-CM

## 2011-11-29 MED ORDER — CARVEDILOL 12.5 MG PO TABS: 12.5000 mg | ORAL_TABLET | Freq: Two times a day (BID) | ORAL | Status: DC

## 2011-11-29 NOTE — Assessment & Plan Note (Addendum)
Trigeminal neuralgia vs a cluster headache Celebrex 200 mg bid pc - samples

## 2011-11-29 NOTE — Assessment & Plan Note (Signed)
Controlled not too well in 1/13 Increase Coreg dose

## 2011-11-29 NOTE — Progress Notes (Signed)
  Subjective:    Patient ID: Scott Oneal, male    DOB: 1941/07/16, 71 y.o.   MRN: [id number]  HPI   He had jabbing short series of pain in the L temple starting Sunday - q 5 min. He did not take any OTC HA meds. Better now 1 series per 1 h - less intense. No rash. No vision change. No CP.  F/u HTN - BP is up at home; CAD   Review of Systems  Constitutional: Negative for appetite change, fatigue and unexpected weight change.  HENT: Negative for nosebleeds, congestion, sore throat, sneezing, trouble swallowing and neck pain.   Eyes: Negative for itching and visual disturbance.  Respiratory: Negative for cough.   Cardiovascular: Negative for chest pain, palpitations and leg swelling.  Gastrointestinal: Negative for nausea, diarrhea, blood in stool and abdominal distention.  Genitourinary: Negative for frequency and hematuria.  Musculoskeletal: Negative for back pain, joint swelling and gait problem.  Skin: Negative for rash.  Neurological: Positive for headaches. Negative for dizziness, tremors, seizures, speech difficulty, weakness, light-headedness and numbness.  Psychiatric/Behavioral: Negative for sleep disturbance, dysphoric mood and agitation. The patient is not nervous/anxious.        Objective:   Physical Exam  Constitutional: He is oriented to person, place, and time. He appears well-developed.  HENT:  Mouth/Throat: Oropharynx is clear and moist.  Eyes: Conjunctivae are normal. Pupils are equal, round, and reactive to light.  Neck: Normal range of motion. No JVD present. No thyromegaly present.  Cardiovascular: Normal rate, regular rhythm, normal heart sounds and intact distal pulses.  Exam reveals no gallop and no friction rub.   No murmur heard. Pulmonary/Chest: Effort normal and breath sounds normal. No respiratory distress. He has no wheezes. He has no rales. He exhibits no tenderness.  Abdominal: Soft. Bowel sounds are normal. He exhibits no distension and no mass.  There is no tenderness. There is no rebound and no guarding.  Musculoskeletal: Normal range of motion. He exhibits no edema and no tenderness.       Head NT, no rash  Lymphadenopathy:    He has no cervical adenopathy.  Neurological: He is alert and oriented to person, place, and time. He has normal reflexes. No cranial nerve deficit. He exhibits normal muscle tone. Coordination normal.  Skin: Skin is warm and dry. No rash noted.  Psychiatric: He has a normal mood and affect. His behavior is normal. Judgment and thought content normal.          Assessment & Plan:

## 2011-11-29 NOTE — Assessment & Plan Note (Signed)
Stress test is planned

## 2011-11-29 NOTE — Patient Instructions (Signed)
"  Trigeminal neuralgia vs a cluster headache"   Celebrex 200 mg one twice a day with food  Call if rash, call if not well

## 2011-11-30 ENCOUNTER — Encounter: Payer: Medicare Other | Admitting: Cardiology

## 2011-12-09 ENCOUNTER — Ambulatory Visit (INDEPENDENT_AMBULATORY_CARE_PROVIDER_SITE_OTHER): Payer: Medicare Other | Admitting: Cardiology

## 2011-12-09 DIAGNOSIS — I2510 Atherosclerotic heart disease of native coronary artery without angina pectoris: Secondary | ICD-10-CM

## 2011-12-09 NOTE — Progress Notes (Signed)
Exercise Treadmill Test  Pre-Exercise Testing Evaluation Rhythm: normal sinus  Rate: 64   PR:  .13 QRS:  .08  QT:  .40 QTc: 41     Test  Exercise Tolerance Test Ordering MD: [name] Quarry, MD  Interpreting MD:  [name] Quarry, MD  Unique Test No: 1  Treadmill:  1  Indication for ETT: known ASHD  Contraindication to ETT: No   Stress Modality: exercise - treadmill  Cardiac Imaging Performed: non   Protocol: standard Bruce - maximal  Max BP:  187/80  Max MPHR (bpm):  150 85% MPR (bpm):  127  MPHR obtained (bpm):  136 % MPHR obtained:  90  Reached 85% MPHR (min:sec):  9:30 Total Exercise Time (min-sec):  9:43  Workload in METS:  11.2 Borg Scale: 13  Reason ETT Terminated:  fatigue    ST Segment Analysis At Rest: normal ST segments - no evidence of significant ST depression With Exercise: no evidence of significant ST depression  Other Information Arrhythmia:  Yes Angina during ETT:  absent (0) Quality of ETT:  diagnostic  ETT Interpretation:  normal - no evidence of ischemia by ST analysis  Comments: Patient exercised today on the Bruce protocol.  He had excellent exercise tolerance and no exercise induced chest pain or ST depression.  He had a brief run of SVT that was not sustained.    Recommendations: Continue medical therapy.  Follow up with me.

## 2011-12-25 ENCOUNTER — Other Ambulatory Visit: Payer: Self-pay | Admitting: Internal Medicine

## 2012-05-21 NOTE — Telephone Encounter (Signed)
x

## 2012-06-04 ENCOUNTER — Telehealth: Payer: Self-pay | Admitting: *Deleted

## 2012-06-04 DIAGNOSIS — Z0389 Encounter for observation for other suspected diseases and conditions ruled out: Secondary | ICD-10-CM

## 2012-06-04 DIAGNOSIS — Z0000 Encounter for general adult medical examination without abnormal findings: Secondary | ICD-10-CM

## 2012-06-04 MED ORDER — TESTOSTERONE CYPIONATE 200 MG/ML IM SOLN: 400.0000 mg | INTRAMUSCULAR | Status: DC

## 2012-06-04 NOTE — Telephone Encounter (Signed)
OK to fill this prescription with additional refills x5 Thank you!  

## 2012-06-04 NOTE — Telephone Encounter (Signed)
Done

## 2012-06-04 NOTE — Telephone Encounter (Signed)
Rf req for Testosterone 200 mg/ml 400 mg q 2 wks. Last filled 02/20/12. Ok to RF?

## 2012-06-04 NOTE — Telephone Encounter (Signed)
Message copied by Cresenciano Lick on Mon Jun 04, 2012  8:27 AM ------      Message from: COUSIN, SHARON T      Created: Fri May 25, 2012 12:58 PM      Regarding: PHY DATE  09/10/12       THANKS

## 2012-06-04 NOTE — Telephone Encounter (Signed)
09/10/12 CPE labs entered.

## 2012-06-18 ENCOUNTER — Other Ambulatory Visit: Payer: Self-pay

## 2012-06-18 MED ORDER — CLOPIDOGREL BISULFATE 75 MG PO TABS: 75.0000 mg | ORAL_TABLET | Freq: Every day | ORAL | Status: DC

## 2012-06-18 MED ORDER — CALCITRIOL 0.25 MCG PO CAPS: 0.2500 ug | ORAL_CAPSULE | Freq: Every day | ORAL | Status: DC

## 2012-07-19 ENCOUNTER — Encounter: Payer: Self-pay | Admitting: Gastroenterology

## 2012-08-07 ENCOUNTER — Ambulatory Visit (INDEPENDENT_AMBULATORY_CARE_PROVIDER_SITE_OTHER): Payer: Medicare Other | Admitting: *Deleted

## 2012-08-07 DIAGNOSIS — Z23 Encounter for immunization: Secondary | ICD-10-CM

## 2012-08-27 ENCOUNTER — Other Ambulatory Visit: Payer: Self-pay | Admitting: Sports Medicine

## 2012-08-27 DIAGNOSIS — M25512 Pain in left shoulder: Secondary | ICD-10-CM

## 2012-08-30 ENCOUNTER — Other Ambulatory Visit: Payer: Self-pay | Admitting: Cardiology

## 2012-08-30 DIAGNOSIS — I6529 Occlusion and stenosis of unspecified carotid artery: Secondary | ICD-10-CM

## 2012-09-05 ENCOUNTER — Encounter (INDEPENDENT_AMBULATORY_CARE_PROVIDER_SITE_OTHER): Payer: Medicare Other

## 2012-09-05 DIAGNOSIS — I6529 Occlusion and stenosis of unspecified carotid artery: Secondary | ICD-10-CM

## 2012-09-06 ENCOUNTER — Ambulatory Visit
Admission: RE | Admit: 2012-09-06 | Discharge: 2012-09-06 | Disposition: A | Discharge status: Home / Self Care | Payer: Medicare Other | Source: Ambulatory Visit | Attending: Sports Medicine | Admitting: Sports Medicine

## 2012-09-06 DIAGNOSIS — M25512 Pain in left shoulder: Secondary | ICD-10-CM

## 2012-09-10 ENCOUNTER — Other Ambulatory Visit (INDEPENDENT_AMBULATORY_CARE_PROVIDER_SITE_OTHER): Payer: Medicare Other

## 2012-09-10 ENCOUNTER — Encounter: Payer: Self-pay | Admitting: Internal Medicine

## 2012-09-10 ENCOUNTER — Ambulatory Visit (INDEPENDENT_AMBULATORY_CARE_PROVIDER_SITE_OTHER): Payer: Medicare Other | Admitting: Internal Medicine

## 2012-09-10 ENCOUNTER — Ambulatory Visit (INDEPENDENT_AMBULATORY_CARE_PROVIDER_SITE_OTHER)
Admission: RE | Admit: 2012-09-10 | Discharge: 2012-09-10 | Disposition: A | Discharge status: Home / Self Care | Payer: Medicare Other | Source: Ambulatory Visit | Attending: Internal Medicine | Admitting: Internal Medicine

## 2012-09-10 VITALS — BP 160/68 | HR 72 | Temp 98.4°F | Resp 16 | Ht 71.0 in | Wt 216.0 lb

## 2012-09-10 DIAGNOSIS — K900 Celiac disease: Secondary | ICD-10-CM

## 2012-09-10 DIAGNOSIS — Z0389 Encounter for observation for other suspected diseases and conditions ruled out: Secondary | ICD-10-CM

## 2012-09-10 DIAGNOSIS — R972 Elevated prostate specific antigen [PSA]: Secondary | ICD-10-CM

## 2012-09-10 DIAGNOSIS — I10 Essential (primary) hypertension: Secondary | ICD-10-CM

## 2012-09-10 DIAGNOSIS — N259 Disorder resulting from impaired renal tubular function, unspecified: Secondary | ICD-10-CM

## 2012-09-10 DIAGNOSIS — Z0000 Encounter for general adult medical examination without abnormal findings: Secondary | ICD-10-CM | POA: Insufficient documentation

## 2012-09-10 DIAGNOSIS — M25519 Pain in unspecified shoulder: Secondary | ICD-10-CM

## 2012-09-10 DIAGNOSIS — M25512 Pain in left shoulder: Secondary | ICD-10-CM

## 2012-09-10 DIAGNOSIS — R5383 Other fatigue: Secondary | ICD-10-CM

## 2012-09-10 DIAGNOSIS — B079 Viral wart, unspecified: Secondary | ICD-10-CM | POA: Insufficient documentation

## 2012-09-10 DIAGNOSIS — J45909 Unspecified asthma, uncomplicated: Secondary | ICD-10-CM

## 2012-09-10 DIAGNOSIS — Z125 Encounter for screening for malignant neoplasm of prostate: Secondary | ICD-10-CM

## 2012-09-10 DIAGNOSIS — E7800 Pure hypercholesterolemia, unspecified: Secondary | ICD-10-CM

## 2012-09-10 LAB — CBC WITH DIFFERENTIAL/PLATELET
Basophils Absolute: 0 10*3/uL (ref 0.0–0.1)
Basophils Relative: 0.5 % (ref 0.0–3.0)
Eosinophils Absolute: 0.3 10*3/uL (ref 0.0–0.7)
Lymphocytes Relative: 20.5 % (ref 12.0–46.0)
MCHC: 33 g/dL (ref 30.0–36.0)
MCV: 95.5 fl (ref 78.0–100.0)
Monocytes Absolute: 0.7 10*3/uL (ref 0.1–1.0)
Neutro Abs: 4.2 10*3/uL (ref 1.4–7.7)
Neutrophils Relative %: 64.1 % (ref 43.0–77.0)
RDW: 15.1 % — ABNORMAL HIGH (ref 11.5–14.6)

## 2012-09-10 LAB — URINALYSIS, ROUTINE W REFLEX MICROSCOPIC
Bilirubin Urine: NEGATIVE
Ketones, ur: NEGATIVE
Leukocytes, UA: NEGATIVE
Urobilinogen, UA: 0.2 (ref 0.0–1.0)

## 2012-09-10 LAB — LIPID PANEL
HDL: 32.6 mg/dL — ABNORMAL LOW (ref >39.00)
Total CHOL/HDL Ratio: 5
Triglycerides: 188 mg/dL — ABNORMAL HIGH (ref 0.0–149.0)

## 2012-09-10 LAB — BASIC METABOLIC PANEL
CO2: 33 mEq/L — ABNORMAL HIGH (ref 19–32)
Calcium: 9.8 mg/dL (ref 8.4–10.5)
Chloride: 104 mEq/L (ref 96–112)
Glucose, Bld: 80 mg/dL (ref 70–99)
Sodium: 143 mEq/L (ref 135–145)

## 2012-09-10 LAB — HEPATIC FUNCTION PANEL
AST: 29 U/L (ref 0–37)
Albumin: 4 g/dL (ref 3.5–5.2)
Alkaline Phosphatase: 57 U/L (ref 39–117)
Total Protein: 6.4 g/dL (ref 6.0–8.3)

## 2012-09-10 MED ORDER — FLUTICASONE-SALMETEROL 100-50 MCG/DOSE IN AEPB: 1.0000 | INHALATION_SPRAY | Freq: Two times a day (BID) | RESPIRATORY_TRACT | Status: DC

## 2012-09-10 NOTE — Patient Instructions (Signed)
   Postprocedure instructions :     Keep the wounds clean. You can wash them with liquid soap and water. Pat dry with gauze or a Kleenex tissue  Before applying antibiotic ointment and a Band-Aid.   You need to report immediately  if  any signs of infection develop.    

## 2012-09-10 NOTE — Assessment & Plan Note (Signed)
Continue with current prescription therapy as reflected on the Med list.  

## 2012-09-10 NOTE — Assessment & Plan Note (Signed)
11/13 Dr Alfonso Ramus Surgery is discussed/planned soon

## 2012-09-10 NOTE — Assessment & Plan Note (Addendum)
CXR Advair prn bid

## 2012-09-10 NOTE — Assessment & Plan Note (Signed)
The patient is here for annual Medicare wellness examination and management of other chronic and acute problems.   The risk factors are reflected in the social history.  The roster of all physicians providing medical care to patient - is listed in the Snapshot section of the chart.  Activities of daily living:  The patient is 100% inedpendent in all ADLs: dressing, toileting, feeding as well as independent mobility  Home safety : good. Using seatbelts. There is no violence in the home.   There is no risks for hepatitis, STDs or HIV. There is no history of blood transfusion. They have no travel history to infectious disease endemic areas of the world.  The patient has  seen their dentist in the last 12 month. They have  seen their eye doctor in the last year. They deny  Any major hearing difficulty and have not had audiologic testing in the last year.  They do not  have excessive sun exposure. Discussed the need for sun protection: hats, long sleeves and use of sunscreen if there is significant sun exposure.   Diet: the importance of a healthy diet is discussed. They do have a reasonably healthy  diet.  The patient has a fairly regular exercise program of a mixed nature: walking, yard work, etc.The benefits of regular aerobic exercise were discussed.  Depression screen: there are no signs or vegative symptoms of depression- irritability, change in appetite, anhedonia, sadness/tearfullness.  Cognitive assessment: the patient manages all their financial and personal affairs and is actively engaged. They could relate day,date,year and events; recalled 3/3 objects at 3 minutes  The following portions of the patient's history were reviewed and updated as appropriate: allergies, current medications, past family history, past medical history,  past surgical history, past social history  and problem list.  Vision, hearing, body mass index were assessed and reviewed.   During the course of the visit  the patient was educated and counseled about appropriate screening and preventive services including : fall prevention , diabetes screening, nutrition counseling, colorectal cancer screening, and recommended immunizations.

## 2012-09-10 NOTE — Progress Notes (Signed)
Subjective:    Patient ID: Scott Oneal, male    DOB: [birth year], 71 y.o.   MRN: [id number]  HPI  The patient is here for a wellness exam. The patient has been doing well overall without major physical or psychological issues going on lately, except for L shoulder pain for several months - he had 3 injections. Ortho appt today - Dr Alfonso Ramus.  The patient presents for a follow-up of  chronic CAD, hypertension, chronic dyslipidemia, hypogonadism controlled with medicines. F/u elev K. C/o wheezing off and on x 2 mo. C/o skin lesions     Review of Systems  Constitutional: Negative for appetite change, fatigue and unexpected weight change.  HENT: Negative for nosebleeds, congestion, sore throat, sneezing, trouble swallowing and neck pain.   Eyes: Negative for itching and visual disturbance.  Respiratory: Negative for cough.   Cardiovascular: Negative for chest pain, palpitations and leg swelling.  Gastrointestinal: Positive for diarrhea. Negative for nausea, blood in stool and abdominal distention.  Genitourinary: Negative for frequency and hematuria.  Musculoskeletal: Negative for back pain, joint swelling and gait problem.  Skin: Negative for rash.  Neurological: Negative for dizziness, tremors, speech difficulty and weakness.  Psychiatric/Behavioral: Negative for sleep disturbance, dysphoric mood and agitation. The patient is nervous/anxious.        Objective:   Physical Exam  Constitutional: He is oriented to person, place, and time. He appears well-developed.  HENT:  Mouth/Throat: Oropharynx is clear and moist.  Eyes: Conjunctivae normal are normal. Pupils are equal, round, and reactive to light.  Neck: Normal range of motion. No JVD present. No thyromegaly present.  Cardiovascular: Normal rate, regular rhythm, normal heart sounds and intact distal pulses.  Exam reveals no gallop and no friction rub.   No murmur heard. Pulmonary/Chest: Effort normal and breath sounds normal. No  respiratory distress. He has no wheezes. He has no rales. He exhibits no tenderness.  Abdominal: Soft. Bowel sounds are normal. He exhibits no distension and no mass. There is no tenderness. There is no rebound and no guarding.  Musculoskeletal: Normal range of motion. He exhibits no edema and no tenderness.  Lymphadenopathy:    He has no cervical adenopathy.  Neurological: He is alert and oriented to person, place, and time. He has normal reflexes. No cranial nerve deficit. He exhibits normal muscle tone. Coordination normal.  Skin: Skin is warm and dry. No rash noted.  Psychiatric: His behavior is normal. Judgment and thought content normal.  warts x3 L shoulder is tender w/ROM   Lab Results  Component Value Date   WBC 5.6 07/27/2011   HGB 15.6 07/27/2011   HCT 47.9 07/27/2011   PLT 140.0* 07/27/2011   CHOL 140 11/17/2010   TRIG 82.0 11/17/2010   HDL 34.50* 11/17/2010   ALT 73* 04/21/2011   AST 58* 04/21/2011   NA 140 08/01/2011   K 5.1 08/01/2011   CL 103 08/01/2011   CREATININE 1.7* 08/01/2011   BUN 18 08/01/2011   CO2 32 08/01/2011   TSH 4.34 07/27/2011   PSA 2.00 11/17/2010   HGBA1C 5.4 05/05/2009    Procedure Note :     Procedure : Cryosurgery   Indication:  Wart(s)     Risks including unsuccessful procedure , bleeding, infection, bruising, scar, a need for a repeat  procedure and others were explained to the patient in detail as well as the benefits. Informed consent was obtained verbally.    3 lesion(s)  On R forearm and scalp  was/were treated with liquid nitrogen on a Q-tip in a usual fasion . Band-Aid was applied and antibiotic ointment was given for a later use.   Tolerated well. Complications none.   Postprocedure instructions :     Keep the wounds clean. You can wash them with liquid soap and water. Pat dry with gauze or a Kleenex tissue  Before applying antibiotic ointment and a Band-Aid.   You need to report immediately  if  any signs of infection develop.    I  personally provided the Advair use teaching. After the teaching patient was able to demonstrate it's use effectively. All questions were answered        Assessment & Plan:

## 2012-09-11 ENCOUNTER — Telehealth: Payer: Self-pay | Admitting: Internal Medicine

## 2012-09-11 MED ORDER — LEVOTHYROXINE SODIUM 112 MCG PO TABS: 112.0000 ug | ORAL_TABLET | Freq: Every day | ORAL | Status: DC

## 2012-09-11 NOTE — Telephone Encounter (Signed)
Scott Oneal, please, inform patient that all labs are normal except for a borderline abn TSH: increase Synthroid to 112 mcg/d PSA went up a little TSH, free PSA in 3-4 mo. ROV Thx

## 2012-09-11 NOTE — Telephone Encounter (Signed)
Pt informed

## 2012-09-20 ENCOUNTER — Other Ambulatory Visit: Payer: Self-pay

## 2012-09-20 MED ORDER — SIMVASTATIN 10 MG PO TABS: 10.0000 mg | ORAL_TABLET | Freq: Every day | ORAL | Status: DC

## 2012-09-25 ENCOUNTER — Telehealth: Payer: Self-pay | Admitting: Internal Medicine

## 2012-09-25 MED ORDER — "SYRINGE/NEEDLE (DISP) 23G X 1"" 3 ML MISC": 1.0000 | Status: DC

## 2012-09-25 NOTE — Telephone Encounter (Signed)
Patient calling about Rx refill for 44m LL syringe 25gx5.  States requested refill 09/12/12 and [facility] faxed refill request 09/13/12 but states never got an updated order.  Per Epic, no specific note in system, but "historical provider" Rx is not clear to RN.  Info to office for staff review/callback.   Patient  uses  [facility] [phone]  May reach patient at [phone]  krs/can

## 2012-09-25 NOTE — Telephone Encounter (Signed)
Rx sent for syringes, pt informed.

## 2012-09-28 ENCOUNTER — Other Ambulatory Visit: Payer: Self-pay | Admitting: Internal Medicine

## 2012-11-19 ENCOUNTER — Ambulatory Visit (INDEPENDENT_AMBULATORY_CARE_PROVIDER_SITE_OTHER): Payer: Medicare Other | Admitting: Cardiology

## 2012-11-19 ENCOUNTER — Encounter: Payer: Self-pay | Admitting: Cardiology

## 2012-11-19 VITALS — BP 160/90 | HR 66 | Ht 72.0 in | Wt 215.0 lb

## 2012-11-19 DIAGNOSIS — I2510 Atherosclerotic heart disease of native coronary artery without angina pectoris: Secondary | ICD-10-CM

## 2012-11-19 DIAGNOSIS — D696 Thrombocytopenia, unspecified: Secondary | ICD-10-CM | POA: Insufficient documentation

## 2012-11-19 DIAGNOSIS — E7800 Pure hypercholesterolemia, unspecified: Secondary | ICD-10-CM

## 2012-11-19 NOTE — Patient Instructions (Signed)
Your physician wants you to follow-up in: 1 YEAR with Dr Cooper.  You will receive a reminder letter in the mail two months in advance. If you don't receive a letter, please call our office to schedule the follow-up appointment.  Your physician recommends that you continue on your current medications as directed. Please refer to the Current Medication list given to you today.  

## 2012-11-19 NOTE — Assessment & Plan Note (Signed)
No history of bleeding.  Known chronically.

## 2012-11-19 NOTE — Progress Notes (Signed)
HPI:  Patient returns in followup. He is doing extremely well. He denies any ongoing chest pain or shortness of breath. He feels really quite good. I had an extensive discussion again with him today regarding dual antiplatelet therapy, the indications for his initial placement of a drug-eluting stent, the nature of first generation drug-eluting stents placed in the setting of acute myocardial infarction, and the pros and cons of continued or discontinuation of dual antiplatelet therapy. We'll set long discussion about his shoulder. He's had several rehabilitation attempts, and wonders whether he may need to have something more substantial done. His being followed by [facility].  Recent carotids are not high grade.  FU 2 years suggested.    Current Outpatient Prescriptions  Medication Sig Dispense Refill  . aspirin 81 MG tablet Take 81 mg by mouth daily.        . calcitRIOL (ROCALTROL) 0.25 MCG capsule Take 1 capsule (0.25 mcg total) by mouth daily.  90 capsule  1  . carvedilol (COREG) 12.5 MG tablet Take 1 tablet (12.5 mg total) by mouth 2 (two) times daily with a meal.  180 tablet  3  . clopidogrel (PLAVIX) 75 MG tablet Take 1 tablet (75 mg total) by mouth daily.  90 tablet  1  . fish oil-omega-3 fatty acids 1000 MG capsule Take 1 g by mouth daily.        . Fluticasone-Salmeterol (ADVAIR DISKUS) 100-50 MCG/DOSE AEPB Inhale 1 puff into the lungs 2 (two) times daily.  1 each  11  . latanoprost (XALATAN) 0.005 % ophthalmic solution Place 1 drop into both eyes at bedtime.        Marland Kitchen levothyroxine (SYNTHROID, LEVOTHROID) 112 MCG tablet Take 1 tablet (112 mcg total) by mouth daily.  90 tablet  3  . Multiple Vitamins-Minerals (MULTIVITAMIN,TX-MINERALS) tablet Take 1 tablet by mouth daily.        . nitroGLYCERIN (NITROSTAT) 0.4 MG SL tablet Place 0.4 mg under the tongue every 5 (five) minutes as needed.        . sildenafil (VIAGRA) 100 MG tablet Take 100 mg by mouth daily as needed.        .  simvastatin (ZOCOR) 10 MG tablet Take 1 tablet (10 mg total) by mouth at bedtime.  90 tablet  1  . SYRINGE-NEEDLE, DISP, 3 ML (B-D INTEGRA SYRINGE) 23G X 1" 3 ML MISC Inject 1 Syringe into the muscle as directed. [place] for any brand, please use 23g 1 inch  10 each  1  . testosterone cypionate (DEPO-TESTOSTERONE) 200 MG/ML injection Inject 2 mLs (400 mg total) into the muscle every 14 (fourteen) days.  10 mL  5    Allergies  Allergen Reactions  . Allantoin-Pramoxine   . Metoprolol Tartrate     REACTION: fatigue    Past Medical History  Diagnosis Date  . Coronary atherosclerosis of unspecified type of vessel, native or graft   . Old myocardial infarction   . Unspecified essential hypertension   . Pure hypercholesterolemia   . Long term (current) use of anticoagulants   . Other specified cardiac dysrhythmias   . Esophageal reflux   . Other malaise and fatigue   . Other testicular hypofunction   . Personal history of colonic polyps   . Osteoporosis, unspecified   . Calculus of gallbladder without mention of cholecystitis   . Cholelithiasis   . Celiac disease   . Loss of weight   . Unspecified asthma   . Diarrhea   .  Unspecified hypothyroidism   . Gout, unspecified   . Benign neoplasm of colon   . Renal insufficiency     chronic  . Hyperparathyroidism     Past Surgical History  Procedure Date  . Knee surgery     right  . Coronary stent placement 2011    Cypher; in distal circumflex artery  . Cystoscopy 1998    Family History  Problem Relation Age of Onset  . Hypertension    . Asthma Neg Hx   . Colon cancer Neg Hx   . Lymphoma Father   . Cancer Father     lymphoma  . Vision loss Maternal Grandmother     History   Social History  . Marital Status: Married    Spouse Name: Javari Bufkin    Number of Children: N/A  . Years of Education: N/A   Occupational History  . retired    Social History Main Topics  . Smoking status: Never Smoker   . Smokeless tobacco:  Not on file  . Alcohol Use: No  . Drug Use: No  . Sexually Active: Yes   Other Topics Concern  . Not on file   Social History Narrative   Retired.Regular Exercise-Yes; yoga & silver sneakersDaily Caffeine Use.    ROS: Please see the HPI.  All other systems reviewed and negative.  PHYSICAL EXAM:  BP 160/90  Pulse 66  Ht 6' (1.829 m)  Wt 215 lb (97.523 kg)  BMI 29.16 kg/m2  SpO2 95%  General: Well developed, well nourished, in no acute distress. Head:  Normocephalic and atraumatic. Neck: no JVD Lungs: Clear to auscultation and percussion. Heart: Normal S1 and S2.  No murmur, rubs or gallops.  Abdomen:  Normal bowel sounds; soft; non tender; no organomegaly Pulses: Pulses normal in all 4 extremities. Extremities: No clubbing or cyanosis. No edema. Neurologic: Alert and oriented x 3.  EKG:  NSR.  WNL.    ASSESSMENT AND PLAN:

## 2012-11-19 NOTE — Assessment & Plan Note (Signed)
[facility] Followed by Dr. Alain Marion.

## 2012-11-19 NOTE — Assessment & Plan Note (Signed)
Patient remains stable from a cardiac standpoint. He remains on dual antiplatelet therapy. His initial drug-eluting stent was placed in the setting of a total occlusion. He will remain on medical therapy long-term for now, he will remain on dual antiplatelet therapy. We will have him followup with Dr. Burt Knack in one year.

## 2012-11-26 ENCOUNTER — Other Ambulatory Visit: Payer: Self-pay | Admitting: Internal Medicine

## 2012-12-12 ENCOUNTER — Encounter: Payer: Self-pay | Admitting: Internal Medicine

## 2012-12-12 ENCOUNTER — Ambulatory Visit (INDEPENDENT_AMBULATORY_CARE_PROVIDER_SITE_OTHER): Payer: Medicare Other | Admitting: Internal Medicine

## 2012-12-12 ENCOUNTER — Ambulatory Visit: Payer: Medicare Other

## 2012-12-12 VITALS — BP 170/80 | HR 72 | Temp 98.3°F | Resp 16 | Wt 215.0 lb

## 2012-12-12 DIAGNOSIS — R350 Frequency of micturition: Secondary | ICD-10-CM

## 2012-12-12 DIAGNOSIS — R972 Elevated prostate specific antigen [PSA]: Secondary | ICD-10-CM

## 2012-12-12 DIAGNOSIS — L570 Actinic keratosis: Secondary | ICD-10-CM

## 2012-12-12 DIAGNOSIS — K900 Celiac disease: Secondary | ICD-10-CM

## 2012-12-12 DIAGNOSIS — D696 Thrombocytopenia, unspecified: Secondary | ICD-10-CM

## 2012-12-12 DIAGNOSIS — I2510 Atherosclerotic heart disease of native coronary artery without angina pectoris: Secondary | ICD-10-CM

## 2012-12-12 DIAGNOSIS — E875 Hyperkalemia: Secondary | ICD-10-CM

## 2012-12-12 DIAGNOSIS — I10 Essential (primary) hypertension: Secondary | ICD-10-CM

## 2012-12-12 DIAGNOSIS — Z23 Encounter for immunization: Secondary | ICD-10-CM

## 2012-12-12 DIAGNOSIS — J45909 Unspecified asthma, uncomplicated: Secondary | ICD-10-CM

## 2012-12-12 LAB — HEPATIC FUNCTION PANEL
ALT: 28 U/L (ref 0–53)
AST: 23 U/L (ref 0–37)
Bilirubin, Direct: 0.2 mg/dL (ref 0.0–0.3)
Total Bilirubin: 1 mg/dL (ref 0.3–1.2)
Total Protein: 6.2 g/dL (ref 6.0–8.3)

## 2012-12-12 LAB — CBC WITH DIFFERENTIAL/PLATELET
Basophils Relative: 0.5 % (ref 0.0–3.0)
Eosinophils Relative: 1.4 % (ref 0.0–5.0)
HCT: 52.3 % — ABNORMAL HIGH (ref 39.0–52.0)
Hemoglobin: 17 g/dL (ref 13.0–17.0)
Lymphs Abs: 1.5 10*3/uL (ref 0.7–4.0)
MCV: 97 fl (ref 78.0–100.0)
Monocytes Absolute: 0.7 10*3/uL (ref 0.1–1.0)
Monocytes Relative: 9.2 % (ref 3.0–12.0)
RBC: 5.39 Mil/uL (ref 4.22–5.81)
WBC: 7.9 10*3/uL (ref 4.5–10.5)

## 2012-12-12 LAB — URINALYSIS
Bilirubin Urine: NEGATIVE
Ketones, ur: NEGATIVE
Leukocytes, UA: NEGATIVE
Urine Glucose: NEGATIVE
Urobilinogen, UA: 0.2 (ref 0.0–1.0)
pH: 7 (ref 5.0–8.0)

## 2012-12-12 LAB — BASIC METABOLIC PANEL
BUN: 16 mg/dL (ref 6–23)
CO2: 28 mEq/L (ref 19–32)
Calcium: 9.7 mg/dL (ref 8.4–10.5)
GFR: 38.23 mL/min — ABNORMAL LOW (ref >60.00)
Glucose, Bld: 78 mg/dL (ref 70–99)

## 2012-12-12 MED ORDER — FLUTICASONE-SALMETEROL 100-50 MCG/DOSE IN AEPB: 1.0000 | INHALATION_SPRAY | Freq: Two times a day (BID) | RESPIRATORY_TRACT | Status: DC

## 2012-12-12 MED ORDER — CLOPIDOGREL BISULFATE 75 MG PO TABS: 75.0000 mg | ORAL_TABLET | Freq: Every day | ORAL | Status: DC

## 2012-12-12 MED ORDER — CARVEDILOL 25 MG PO TABS: 25.0000 mg | ORAL_TABLET | Freq: Two times a day (BID) | ORAL | Status: DC

## 2012-12-12 MED ORDER — CALCITRIOL 0.25 MCG PO CAPS: 0.2500 ug | ORAL_CAPSULE | Freq: Every day | ORAL | Status: DC

## 2012-12-12 MED ORDER — CIPROFLOXACIN HCL 250 MG PO TABS: 250.0000 mg | ORAL_TABLET | Freq: Two times a day (BID) | ORAL | Status: DC

## 2012-12-12 NOTE — Assessment & Plan Note (Signed)
Continue with current prescription therapy as reflected on the Med list.  

## 2012-12-12 NOTE — Assessment & Plan Note (Signed)
BMET 

## 2012-12-12 NOTE — Assessment & Plan Note (Signed)
Increase Coreg dose

## 2012-12-12 NOTE — Assessment & Plan Note (Signed)
See procedure 

## 2012-12-12 NOTE — Assessment & Plan Note (Signed)
Recheck PSA

## 2012-12-12 NOTE — Assessment & Plan Note (Signed)
cbc

## 2012-12-12 NOTE — Progress Notes (Signed)
Subjective:    HPI  C/o urinary sx's and sweat x 2 wks C/o occ wheezing - cold air would trigger it. The patient has been doing well overall without major physical or psychological issues going on lately, except for L shoulder pain for several months - he had 3 injections. Ortho appt today - Dr Alfonso Ramus.  The patient presents for a follow-up of  chronic CAD, hypertension, chronic dyslipidemia, hypogonadism controlled with medicines. F/u elev K. C/o wheezing off and on x 2 mo. C/o skin lesions  Wt Readings from Last 3 Encounters:  12/12/12 215 lb (97.523 kg)  11/19/12 215 lb (97.523 kg)  09/10/12 216 lb (97.977 kg)   BP Readings from Last 3 Encounters:  12/12/12 170/80  11/19/12 160/90  09/10/12 160/68       Review of Systems  Constitutional: Negative for appetite change, fatigue and unexpected weight change.  HENT: Negative for nosebleeds, congestion, sore throat, sneezing, trouble swallowing and neck pain.   Eyes: Negative for itching and visual disturbance.  Respiratory: Positive for wheezing. Negative for cough.   Cardiovascular: Negative for chest pain, palpitations and leg swelling.  Gastrointestinal: Negative for nausea, diarrhea, blood in stool and abdominal distention.  Genitourinary: Negative for frequency and hematuria.  Musculoskeletal: Negative for back pain, joint swelling and gait problem.  Skin: Negative for rash.  Neurological: Negative for dizziness, tremors, speech difficulty and weakness.  Psychiatric/Behavioral: Negative for sleep disturbance, dysphoric mood and agitation. The patient is nervous/anxious.        Objective:   Physical Exam  Constitutional: He is oriented to person, place, and time. He appears well-developed.  HENT:  Mouth/Throat: Oropharynx is clear and moist.  Eyes: Conjunctivae are normal. Pupils are equal, round, and reactive to light.  Neck: Normal range of motion. No JVD present. No thyromegaly present.  Cardiovascular: Normal  rate, regular rhythm, normal heart sounds and intact distal pulses.  Exam reveals no gallop and no friction rub.   No murmur heard. Pulmonary/Chest: Effort normal and breath sounds normal. No respiratory distress. He has no wheezes. He has no rales. He exhibits no tenderness.  Abdominal: Soft. Bowel sounds are normal. He exhibits no distension and no mass. There is no tenderness. There is no rebound and no guarding.  Musculoskeletal: Normal range of motion. He exhibits no edema and no tenderness.  Lymphadenopathy:    He has no cervical adenopathy.  Neurological: He is alert and oriented to person, place, and time. He has normal reflexes. No cranial nerve deficit. He exhibits normal muscle tone. Coordination normal.  Skin: Skin is warm and dry. No rash noted.  Psychiatric: His behavior is normal. Judgment and thought content normal.  AKs L shoulder is tender w/ROM   Lab Results  Component Value Date   WBC 6.6 09/10/2012   HGB 16.5 09/10/2012   HCT 50.0 09/10/2012   PLT 111.0* 09/10/2012   CHOL 165 09/10/2012   TRIG 188.0* 09/10/2012   HDL 32.60* 09/10/2012   ALT 25 09/10/2012   AST 29 09/10/2012   NA 143 09/10/2012   K 5.1 09/10/2012   CL 104 09/10/2012   CREATININE 1.9* 09/10/2012   BUN 17 09/10/2012   CO2 33* 09/10/2012   TSH 4.05 09/10/2012   PSA 2.92 09/10/2012   HGBA1C 5.4 05/05/2009     Procedure Note :     Procedure : Cryosurgery   Indication:  Actinic keratosis(es)   Risks including unsuccessful procedure , bleeding, infection, bruising, scar, a need for a repeat  procedure and others were explained to the patient in detail as well as the benefits. Informed consent was obtained verbally.   9  lesion(s)  on  face was/were treated with liquid nitrogen on a Q-tip in a usual fasion . Band-Aid was applied and antibiotic ointment was given for a later use.   Tolerated well. Complications none.       Assessment & Plan:

## 2012-12-12 NOTE — Assessment & Plan Note (Signed)
UA Cipro if needed

## 2012-12-19 ENCOUNTER — Other Ambulatory Visit (INDEPENDENT_AMBULATORY_CARE_PROVIDER_SITE_OTHER): Payer: Medicare Other

## 2012-12-19 DIAGNOSIS — I2510 Atherosclerotic heart disease of native coronary artery without angina pectoris: Secondary | ICD-10-CM

## 2012-12-19 DIAGNOSIS — R972 Elevated prostate specific antigen [PSA]: Secondary | ICD-10-CM

## 2012-12-19 DIAGNOSIS — R350 Frequency of micturition: Secondary | ICD-10-CM

## 2012-12-19 DIAGNOSIS — E875 Hyperkalemia: Secondary | ICD-10-CM

## 2012-12-19 DIAGNOSIS — K900 Celiac disease: Secondary | ICD-10-CM

## 2012-12-19 DIAGNOSIS — I10 Essential (primary) hypertension: Secondary | ICD-10-CM

## 2012-12-19 DIAGNOSIS — J45909 Unspecified asthma, uncomplicated: Secondary | ICD-10-CM

## 2012-12-19 DIAGNOSIS — D696 Thrombocytopenia, unspecified: Secondary | ICD-10-CM

## 2012-12-21 ENCOUNTER — Encounter: Payer: Self-pay | Admitting: Internal Medicine

## 2013-01-02 ENCOUNTER — Encounter: Payer: Self-pay | Admitting: Internal Medicine

## 2013-01-07 ENCOUNTER — Other Ambulatory Visit: Payer: Self-pay | Admitting: Internal Medicine

## 2013-01-07 MED ORDER — LOSARTAN POTASSIUM 100 MG PO TABS: 100.0000 mg | ORAL_TABLET | Freq: Every day | ORAL | Status: DC

## 2013-02-11 ENCOUNTER — Other Ambulatory Visit: Payer: Self-pay | Admitting: Internal Medicine

## 2013-02-19 NOTE — Telephone Encounter (Signed)
Testosterone script called to pharmacy

## 2013-03-12 ENCOUNTER — Ambulatory Visit (INDEPENDENT_AMBULATORY_CARE_PROVIDER_SITE_OTHER): Payer: Medicare Other | Admitting: Internal Medicine

## 2013-03-12 ENCOUNTER — Encounter: Payer: Self-pay | Admitting: Internal Medicine

## 2013-03-12 VITALS — BP 140/84 | HR 84 | Temp 98.2°F | Resp 16 | Wt 210.0 lb

## 2013-03-12 DIAGNOSIS — E7800 Pure hypercholesterolemia, unspecified: Secondary | ICD-10-CM

## 2013-03-12 DIAGNOSIS — I10 Essential (primary) hypertension: Secondary | ICD-10-CM

## 2013-03-12 DIAGNOSIS — K900 Celiac disease: Secondary | ICD-10-CM

## 2013-03-12 DIAGNOSIS — I498 Other specified cardiac arrhythmias: Secondary | ICD-10-CM

## 2013-03-12 DIAGNOSIS — E039 Hypothyroidism, unspecified: Secondary | ICD-10-CM

## 2013-03-12 DIAGNOSIS — I2510 Atherosclerotic heart disease of native coronary artery without angina pectoris: Secondary | ICD-10-CM

## 2013-03-12 MED ORDER — CANDESARTAN CILEXETIL 16 MG PO TABS: 16.0000 mg | ORAL_TABLET | Freq: Every day | ORAL | Status: DC

## 2013-03-12 NOTE — Assessment & Plan Note (Signed)
Continue with current prescription therapy as reflected on the Med list.  

## 2013-03-12 NOTE — Assessment & Plan Note (Signed)
Doing ok.

## 2013-03-12 NOTE — Assessment & Plan Note (Signed)
SBP is high at times. He has not been taking Losartan. Will start Atacand 16 mg/d

## 2013-03-12 NOTE — Assessment & Plan Note (Signed)
On gluten free diet

## 2013-03-12 NOTE — Progress Notes (Signed)
   Subjective:    HPI   The patient has been doing well overall without major physical or psychological issues going on lately, except for L shoulder pain for several months - he had 3 injections.   The patient presents for a follow-up of  chronic CAD, hypertension, chronic dyslipidemia, hypogonadism controlled with medicines. F/u elev K. C/o wheezing off and on x 2 mo. C/o elev BP  Wt Readings from Last 3 Encounters:  03/12/13 210 lb (95.255 kg)  12/12/12 215 lb (97.523 kg)  11/19/12 215 lb (97.523 kg)   BP Readings from Last 3 Encounters:  03/12/13 140/84  12/12/12 170/80  11/19/12 160/90       Review of Systems  Constitutional: Negative for appetite change, fatigue and unexpected weight change.  HENT: Negative for nosebleeds, congestion, sore throat, sneezing, trouble swallowing and neck pain.   Eyes: Negative for itching and visual disturbance.  Respiratory: Positive for wheezing. Negative for cough.   Cardiovascular: Negative for chest pain, palpitations and leg swelling.  Gastrointestinal: Negative for nausea, diarrhea, blood in stool and abdominal distention.  Genitourinary: Negative for frequency and hematuria.  Musculoskeletal: Negative for back pain, joint swelling and gait problem.  Skin: Negative for rash.  Neurological: Negative for dizziness, tremors, speech difficulty and weakness.  Psychiatric/Behavioral: Negative for sleep disturbance, dysphoric mood and agitation. The patient is nervous/anxious.        Objective:   Physical Exam  Constitutional: He is oriented to person, place, and time. He appears well-developed.  HENT:  Mouth/Throat: Oropharynx is clear and moist.  Eyes: Conjunctivae are normal. Pupils are equal, round, and reactive to light.  Neck: Normal range of motion. No JVD present. No thyromegaly present.  Cardiovascular: Normal rate, regular rhythm, normal heart sounds and intact distal pulses.  Exam reveals no gallop and no friction rub.    No murmur heard. Pulmonary/Chest: Effort normal and breath sounds normal. No respiratory distress. He has no wheezes. He has no rales. He exhibits no tenderness.  Abdominal: Soft. Bowel sounds are normal. He exhibits no distension and no mass. There is no tenderness. There is no rebound and no guarding.  Musculoskeletal: Normal range of motion. He exhibits no edema and no tenderness.  Lymphadenopathy:    He has no cervical adenopathy.  Neurological: He is alert and oriented to person, place, and time. He has normal reflexes. No cranial nerve deficit. He exhibits normal muscle tone. Coordination normal.  Skin: Skin is warm and dry. No rash noted.  Psychiatric: His behavior is normal. Judgment and thought content normal.   L shoulder is tender w/ROM   Lab Results  Component Value Date   WBC 7.9 12/12/2012   HGB 17.0 12/12/2012   HCT 52.3* 12/12/2012   PLT 107.0 Repeated and verified X2.* 12/12/2012   CHOL 165 09/10/2012   TRIG 188.0* 09/10/2012   HDL 32.60* 09/10/2012   ALT 28 12/12/2012   AST 23 12/12/2012   NA 138 12/12/2012   K 5.1 12/12/2012   CL 102 12/12/2012   CREATININE 1.9* 12/12/2012   BUN 16 12/12/2012   CO2 28 12/12/2012   TSH 4.29 12/12/2012   PSA 3.72 12/19/2012   HGBA1C 5.4 05/05/2009         Assessment & Plan:

## 2013-03-19 ENCOUNTER — Other Ambulatory Visit: Payer: Self-pay | Admitting: Internal Medicine

## 2013-03-21 ENCOUNTER — Telehealth: Payer: Self-pay

## 2013-03-21 NOTE — Telephone Encounter (Signed)
Faxed completed PA form for Candesartan 16 mg to ins. Will wait for Ins decision.

## 2013-03-21 NOTE — Telephone Encounter (Signed)
Opened in error

## 2013-03-28 ENCOUNTER — Other Ambulatory Visit: Payer: Self-pay | Admitting: *Deleted

## 2013-03-28 MED ORDER — OLMESARTAN MEDOXOMIL 20 MG PO TABS: 20.0000 mg | ORAL_TABLET | Freq: Every day | ORAL | Status: DC

## 2013-03-28 NOTE — Telephone Encounter (Signed)
Ok Benicar Thx

## 2013-03-28 NOTE — Telephone Encounter (Signed)
Per pt's insurance: [employer] PA is denied. pt must try/fail 2 of the following... Benicar, Diovan, Irbesartan along with Losartan. We have documented Losartan in past and this was submitted to ins. Please advise will any of the other 3 preferred meds work?

## 2013-03-28 NOTE — Telephone Encounter (Signed)
Left detailed mess informing pt of below.  

## 2013-06-13 ENCOUNTER — Other Ambulatory Visit (INDEPENDENT_AMBULATORY_CARE_PROVIDER_SITE_OTHER): Payer: Medicare Other

## 2013-06-13 DIAGNOSIS — I10 Essential (primary) hypertension: Secondary | ICD-10-CM

## 2013-06-13 LAB — BASIC METABOLIC PANEL
BUN: 15 mg/dL (ref 6–23)
GFR: 37.71 mL/min — ABNORMAL LOW (ref >60.00)
Potassium: 4.8 mEq/L (ref 3.5–5.1)
Sodium: 138 mEq/L (ref 135–145)

## 2013-06-19 ENCOUNTER — Ambulatory Visit (INDEPENDENT_AMBULATORY_CARE_PROVIDER_SITE_OTHER): Payer: Medicare Other | Admitting: Internal Medicine

## 2013-06-19 ENCOUNTER — Ambulatory Visit (INDEPENDENT_AMBULATORY_CARE_PROVIDER_SITE_OTHER): Payer: Medicare Other | Admitting: Physician Assistant

## 2013-06-19 ENCOUNTER — Encounter: Payer: Self-pay | Admitting: Physician Assistant

## 2013-06-19 ENCOUNTER — Encounter: Payer: Self-pay | Admitting: Internal Medicine

## 2013-06-19 VITALS — BP 150/80 | HR 80 | Temp 97.8°F | Resp 16 | Wt 211.0 lb

## 2013-06-19 VITALS — BP 147/76 | HR 61 | Ht 72.0 in | Wt 210.0 lb

## 2013-06-19 DIAGNOSIS — I6529 Occlusion and stenosis of unspecified carotid artery: Secondary | ICD-10-CM

## 2013-06-19 DIAGNOSIS — K900 Celiac disease: Secondary | ICD-10-CM

## 2013-06-19 DIAGNOSIS — N259 Disorder resulting from impaired renal tubular function, unspecified: Secondary | ICD-10-CM

## 2013-06-19 DIAGNOSIS — Z01818 Encounter for other preprocedural examination: Secondary | ICD-10-CM | POA: Insufficient documentation

## 2013-06-19 DIAGNOSIS — I2510 Atherosclerotic heart disease of native coronary artery without angina pectoris: Secondary | ICD-10-CM

## 2013-06-19 DIAGNOSIS — R972 Elevated prostate specific antigen [PSA]: Secondary | ICD-10-CM

## 2013-06-19 DIAGNOSIS — I252 Old myocardial infarction: Secondary | ICD-10-CM

## 2013-06-19 DIAGNOSIS — I10 Essential (primary) hypertension: Secondary | ICD-10-CM

## 2013-06-19 DIAGNOSIS — M25519 Pain in unspecified shoulder: Secondary | ICD-10-CM

## 2013-06-19 DIAGNOSIS — M25512 Pain in left shoulder: Secondary | ICD-10-CM

## 2013-06-19 NOTE — Assessment & Plan Note (Signed)
Chronic  S/p Urol evaluation

## 2013-06-19 NOTE — Assessment & Plan Note (Signed)
Patient is here for preoperative clearance before undergoing rotator cuff surgery. The patient has a history of coronary artery disease status post MI treated with overlapping drug-eluting stents to the RCA followed by stent to the circumflex in 2005. He had a negative GXT in 2013. Patient is asymptomatic and has no cardiac complaints. I discussed this patient in detail with Dr. Crissie Sickles who agrees that the patient can proceed with rotator cuff surgery.

## 2013-06-19 NOTE — Progress Notes (Signed)
HPI:  This is a 72 year old male patient who is here for preoperative cardiac clearance before undergoing rotator cuff surgery by Dr. Sudie Bailey in [place]. He has history of coronary artery disease status post  PMI treated with drug-eluting stent to the RCA with overlapping stents and single stent in the circumflex in 2005. He has had no trouble since then and exercises at the [facility] 2-3 times a week. He walks on a treadmill for 45 minutes. He had a negative GXT in 2013. He did have a brief run of SVT that was nonsustained.  Patient denies any chest pain, palpitations, dyspnea, dyspnea on exertion, dizziness, or presyncope.  Allergies:  -- Allantoin-Pramoxine   -- Metoprolol Tartrate    --  REACTION: fatigue  Current Outpatient Prescriptions on File Prior to Visit: aspirin 81 MG tablet, Take 81 mg by mouth daily.  , Disp: , Rfl:  calcitRIOL (ROCALTROL) 0.25 MCG capsule, Take 1 capsule (0.25 mcg  total) by mouth daily., Disp: 90 capsule, Rfl: 1 carvedilol (COREG) 25 MG tablet, Take 1 tablet (25 mg total) by mouth 2 (two) times daily with a meal., Disp: 180 tablet, Rfl: 3 clopidogrel (PLAVIX) 75 MG tablet, Take 1 tablet by mouth  daily, Disp: 90 tablet, Rfl: 1 fish oil-omega-3 fatty acids 1000 MG capsule, Take 1 g by mouth daily.  , Disp: , Rfl:  Fluticasone-Salmeterol (ADVAIR DISKUS) 100-50 MCG/DOSE AEPB, Inhale 1 puff into the lungs 2 (two) times daily., Disp: 1 each, Rfl: 5 levothyroxine (SYNTHROID, LEVOTHROID) 112 MCG tablet, Take 1 tablet (112 mcg total) by mouth daily., Disp: 90 tablet, Rfl: 3 Multiple Vitamins-Minerals (MULTIVITAMIN,TX-MINERALS) tablet, Take 1 tablet by mouth daily.  , Disp: , Rfl:  nitroGLYCERIN (NITROSTAT) 0.4 MG SL tablet, Place 0.4 mg under the tongue every 5 (five) minutes as needed.  , Disp: , Rfl:  sildenafil (VIAGRA) 100 MG tablet, Take 100 mg by mouth daily as needed.  , Disp: , Rfl:  simvastatin (ZOCOR) 10 MG tablet, Take 1 tablet by mouth at  bedtime.,  Disp: 90 tablet, Rfl: 1 SYRINGE-NEEDLE, DISP, 3 ML (B-D INTEGRA SYRINGE) 23G X 1" 3 ML MISC, Inject 1 Syringe into the muscle as directed. [place] for any brand, please use 23g 1 inch, Disp: 10 each, Rfl: 1 testosterone cypionate (DEPOTESTOTERONE CYPIONATE) 200 MG/ML injection, INJECT 2ML (400MG) EVERY 2 WEEKS, Disp: 10 mL, Rfl: 2  No current facility-administered medications on file prior to visit.   Past Medical History:   Coronary atherosclerosis of unspecified type o*              Old myocardial infarction                                    Unspecified essential hypertension                           Pure hypercholesterolemia                                    Long term (current) use of anticoagulants                    Other specified cardiac dysrhythmias(427.89)                 Esophageal reflux  Other malaise and fatigue                                    Other testicular hypofunction                                Personal history of colonic polyps                           Osteoporosis, unspecified                                    Calculus of gallbladder without mention of cho*              Cholelithiasis                                               Celiac disease                                               Loss of weight                                               Unspecified asthma(493.90)                                   Diarrhea                                                     Unspecified hypothyroidism                                   Gout, unspecified                                            Benign neoplasm of colon                                     Renal insufficiency                                            Comment:chronic   Hyperparathyroidism  Past Surgical History:   KNEE SURGERY                                                    Comment:right   CORONARY STENT PLACEMENT                          2011           Comment:Cypher; in distal circumflex artery   CYSTOSCOPY                                       1998        Review of patient's family history indicates:   Hypertension                                            Asthma                         Neg Hx                   Colon cancer                   Neg Hx                   Lymphoma                       Father                   Cancer                         Father                     Comment: lymphoma   Vision loss                    Maternal Grandmother     Social History   Marital Status: Married             Spouse Name: Cyle Kenyon         Years of Education:                 Number of children:             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   Regular Exercise-Yes; yoga & silver sneakers   Daily Caffeine Use.       ROS: See history of present illness otherwise negative   PHYSICAL EXAM: Well-nournished, in no acute distress. Neck: No JVD, HJR, Bruit, or thyroid enlargement  Lungs: No tachypnea, clear without wheezing, rales, or rhonchi  Cardiovascular: RRR, PMI not displaced, heart sounds normal, no murmurs, gallops, bruit, thrill, or heave.  Abdomen: BS normal. Soft without organomegaly, masses, lesions or tenderness.  Extremities: without cyanosis, clubbing or edema. Good distal pulses bilateral  SKin: Warm, no lesions or rashes   Musculoskeletal: No deformities  Neuro: no focal signs  BP 147/76  Pulse 61  Ht 6' (1.829 m)  Wt 210 lb (95.255 kg)  BMI 28.47 kg/m2    EKG:  Sinus bradycardia 57 beats per minute otherwise normal

## 2013-06-19 NOTE — Assessment & Plan Note (Signed)
Carotids in 08/2012 showed 0-39% stenosis followup in November 2015

## 2013-06-19 NOTE — Assessment & Plan Note (Signed)
8/14 Dr Shara Blazing - surgery is planned

## 2013-06-19 NOTE — Assessment & Plan Note (Signed)
Risks associated with Benicar treatment noncompliance were discussed. Compliance was encouraged. Re-start Benicar: star tback with 1/2 tab a day

## 2013-06-19 NOTE — Assessment & Plan Note (Signed)
Stable. Benicar recently added by primary care.

## 2013-06-19 NOTE — Assessment & Plan Note (Signed)
F/u w/Dr Burt Knack appt today

## 2013-06-19 NOTE — Assessment & Plan Note (Signed)
Chronic Nephrol - Dr Clover Mealy Will monitor labs

## 2013-06-19 NOTE — Progress Notes (Signed)
Subjective:    HPI  IM consult Reason: pre-op clearance for L shoulder rotator cuff surgery Ref by Dr Shara Blazing  Hx: The patient has been doing well overall without major physical or psychological issues going on lately, except for L shoulder pain for several months - he had 3 injections prior.   The patient presents for a follow-up of  chronic stable CAD, hypertension, chronic dyslipidemia, hypogonadism controlled with medicines.  SBP 130-140 at home  Wt Readings from Last 3 Encounters:  06/19/13 211 lb (95.709 kg)  03/12/13 210 lb (95.255 kg)  12/12/12 215 lb (97.523 kg)   BP Readings from Last 3 Encounters:  06/19/13 150/80  03/12/13 140/84  12/12/12 170/80   Past Medical History  Diagnosis Date  . Coronary atherosclerosis of unspecified type of vessel, native or graft   . Old myocardial infarction   . Unspecified essential hypertension   . Pure hypercholesterolemia   . Long term (current) use of anticoagulants   . Other specified cardiac dysrhythmias(427.89)   . Esophageal reflux   . Other malaise and fatigue   . Other testicular hypofunction   . Personal history of colonic polyps   . Osteoporosis, unspecified   . Calculus of gallbladder without mention of cholecystitis   . Cholelithiasis   . Celiac disease   . Loss of weight   . Unspecified asthma(493.90)   . Diarrhea   . Unspecified hypothyroidism   . Gout, unspecified   . Benign neoplasm of colon   . Renal insufficiency     chronic  . Hyperparathyroidism    Past Surgical History  Procedure Laterality Date  . Knee surgery      right  . Coronary stent placement  2011    Cypher; in distal circumflex artery  . Cystoscopy  1998    reports that he has never smoked. He does not have any smokeless tobacco history on file. He reports that he does not drink alcohol or use illicit drugs. family history includes Cancer in his father; Hypertension in an other family member; Lymphoma in his father; Vision loss in  his maternal grandmother. There is no history of Asthma or Colon cancer. Allergies  Allergen Reactions  . Allantoin-Pramoxine   . Metoprolol Tartrate     REACTION: fatigue    Current Outpatient Prescriptions on File Prior to Visit  Medication Sig Dispense Refill  . aspirin 81 MG tablet Take 81 mg by mouth daily.        . calcitRIOL (ROCALTROL) 0.25 MCG capsule Take 1 capsule (0.25 mcg  total) by mouth daily.  90 capsule  1  . carvedilol (COREG) 25 MG tablet Take 1 tablet (25 mg total) by mouth 2 (two) times daily with a meal.  180 tablet  3  . clopidogrel (PLAVIX) 75 MG tablet Take 1 tablet by mouth  daily  90 tablet  1  . fish oil-omega-3 fatty acids 1000 MG capsule Take 1 g by mouth daily.        . Fluticasone-Salmeterol (ADVAIR DISKUS) 100-50 MCG/DOSE AEPB Inhale 1 puff into the lungs 2 (two) times daily.  1 each  5  . levothyroxine (SYNTHROID, LEVOTHROID) 112 MCG tablet Take 1 tablet (112 mcg total) by mouth daily.  90 tablet  3  . Multiple Vitamins-Minerals (MULTIVITAMIN,TX-MINERALS) tablet Take 1 tablet by mouth daily.        . nitroGLYCERIN (NITROSTAT) 0.4 MG SL tablet Place 0.4 mg under the tongue every 5 (five) minutes as needed.        Marland Kitchen  sildenafil (VIAGRA) 100 MG tablet Take 100 mg by mouth daily as needed.        . simvastatin (ZOCOR) 10 MG tablet Take 1 tablet by mouth at  bedtime.  90 tablet  1  . SYRINGE-NEEDLE, DISP, 3 ML (B-D INTEGRA SYRINGE) 23G X 1" 3 ML MISC Inject 1 Syringe into the muscle as directed. [place] for any brand, please use 23g 1 inch  10 each  1  . testosterone cypionate (DEPOTESTOTERONE CYPIONATE) 200 MG/ML injection INJECT 2ML (400MG) EVERY 2 WEEKS  10 mL  2  . latanoprost (XALATAN) 0.005 % ophthalmic solution Place 1 drop into both eyes at bedtime.        Marland Kitchen olmesartan (BENICAR) 20 MG tablet Take 1 tablet (20 mg total) by mouth daily.  30 tablet  11   No current facility-administered medications on file prior to visit.      Review of Systems   Constitutional: Negative for appetite change, fatigue and unexpected weight change.  HENT: Negative for nosebleeds, congestion, sore throat, sneezing, trouble swallowing and neck pain.   Eyes: Negative for itching and visual disturbance.  Respiratory: Positive for wheezing. Negative for cough.   Cardiovascular: Negative for chest pain, palpitations and leg swelling.  Gastrointestinal: Negative for nausea, diarrhea, blood in stool and abdominal distention.  Genitourinary: Negative for frequency and hematuria.  Musculoskeletal: Negative for back pain, joint swelling and gait problem.  Skin: Negative for rash.  Neurological: Negative for dizziness, tremors, speech difficulty and weakness.  Psychiatric/Behavioral: Negative for sleep disturbance, dysphoric mood and agitation. The patient is nervous/anxious.        Objective:   Physical Exam  Constitutional: He is oriented to person, place, and time. He appears well-developed.  HENT:  Mouth/Throat: Oropharynx is clear and moist.  Eyes: Conjunctivae are normal. Pupils are equal, round, and reactive to light.  Neck: Normal range of motion. No JVD present. No thyromegaly present.  Cardiovascular: Normal rate, regular rhythm, normal heart sounds and intact distal pulses.  Exam reveals no gallop and no friction rub.   No murmur heard. Pulmonary/Chest: Effort normal and breath sounds normal. No respiratory distress. He has no wheezes. He has no rales. He exhibits no tenderness.  Abdominal: Soft. Bowel sounds are normal. He exhibits no distension and no mass. There is no tenderness. There is no rebound and no guarding.  Musculoskeletal: Normal range of motion. He exhibits no edema and no tenderness.  Lymphadenopathy:    He has no cervical adenopathy.  Neurological: He is alert and oriented to person, place, and time. He has normal reflexes. No cranial nerve deficit. He exhibits normal muscle tone. Coordination normal.  Skin: Skin is warm and dry.  No rash noted.  Psychiatric: His behavior is normal. Judgment and thought content normal.   L shoulder is tender w/ROM   Lab Results  Component Value Date   WBC 7.9 12/12/2012   HGB 17.0 12/12/2012   HCT 52.3* 12/12/2012   PLT 107.0 Repeated and verified X2.* 12/12/2012   CHOL 165 09/10/2012   TRIG 188.0* 09/10/2012   HDL 32.60* 09/10/2012   ALT 28 12/12/2012   AST 23 12/12/2012   NA 138 06/13/2013   K 4.8 06/13/2013   CL 103 06/13/2013   CREATININE 1.9* 06/13/2013   BUN 15 06/13/2013   CO2 30 06/13/2013   TSH 4.29 12/12/2012   PSA 3.72 12/19/2012   HGBA1C 5.4 05/05/2009         Assessment & Plan:

## 2013-06-19 NOTE — Assessment & Plan Note (Signed)
Stable without chest pain 

## 2013-06-19 NOTE — Patient Instructions (Addendum)
Your physician recommends that you continue on your current medications as directed. Please refer to the Current Medication list given to you today.     

## 2013-06-19 NOTE — Assessment & Plan Note (Addendum)
8/14 Scott Oneal is clear for surgery assuming his pre-op tests are acceptable. He will see his cardiologist today. Scott Oneal needs to re-start Benicar. Thank you!

## 2013-06-19 NOTE — Assessment & Plan Note (Signed)
Chronic On gluten free diet

## 2013-07-01 HISTORY — PX: SHOULDER SURGERY: SHX246

## 2013-07-17 ENCOUNTER — Ambulatory Visit (INDEPENDENT_AMBULATORY_CARE_PROVIDER_SITE_OTHER): Payer: Medicare Other

## 2013-07-17 ENCOUNTER — Other Ambulatory Visit: Payer: Self-pay | Admitting: *Deleted

## 2013-07-17 DIAGNOSIS — Z23 Encounter for immunization: Secondary | ICD-10-CM

## 2013-07-17 MED ORDER — SIMVASTATIN 10 MG PO TABS: 10.0000 mg | ORAL_TABLET | Freq: Every day | ORAL | Status: DC

## 2013-07-17 MED ORDER — CLOPIDOGREL BISULFATE 75 MG PO TABS: 75.0000 mg | ORAL_TABLET | Freq: Every day | ORAL | Status: DC

## 2013-07-17 MED ORDER — LEVOTHYROXINE SODIUM 112 MCG PO TABS: 112.0000 ug | ORAL_TABLET | Freq: Every day | ORAL | Status: DC

## 2013-07-17 MED ORDER — CALCITRIOL 0.25 MCG PO CAPS: 0.2500 ug | ORAL_CAPSULE | Freq: Every day | ORAL | Status: DC

## 2013-08-13 ENCOUNTER — Ambulatory Visit: Payer: Medicare Other | Admitting: Physical Therapy

## 2013-08-14 ENCOUNTER — Ambulatory Visit: Payer: Medicare Other | Attending: Orthopedic Surgery | Admitting: Physical Therapy

## 2013-08-14 DIAGNOSIS — IMO0001 Reserved for inherently not codable concepts without codable children: Secondary | ICD-10-CM | POA: Insufficient documentation

## 2013-08-14 DIAGNOSIS — M25519 Pain in unspecified shoulder: Secondary | ICD-10-CM | POA: Insufficient documentation

## 2013-08-14 DIAGNOSIS — M25619 Stiffness of unspecified shoulder, not elsewhere classified: Secondary | ICD-10-CM | POA: Insufficient documentation

## 2013-08-18 ENCOUNTER — Other Ambulatory Visit: Payer: Self-pay | Admitting: Internal Medicine

## 2013-08-19 ENCOUNTER — Ambulatory Visit: Payer: Medicare Other | Admitting: Physical Therapy

## 2013-08-22 ENCOUNTER — Ambulatory Visit: Payer: Medicare Other | Admitting: Physical Therapy

## 2013-08-26 ENCOUNTER — Ambulatory Visit: Payer: Medicare Other | Admitting: Physical Therapy

## 2013-08-27 ENCOUNTER — Ambulatory Visit: Payer: Medicare Other | Admitting: Physical Therapy

## 2013-08-29 ENCOUNTER — Ambulatory Visit: Payer: Medicare Other | Admitting: Physical Therapy

## 2013-09-30 ENCOUNTER — Encounter: Payer: Self-pay | Admitting: Internal Medicine

## 2013-09-30 ENCOUNTER — Ambulatory Visit (INDEPENDENT_AMBULATORY_CARE_PROVIDER_SITE_OTHER): Payer: Medicare Other | Admitting: Internal Medicine

## 2013-09-30 VITALS — BP 134/90 | HR 80 | Temp 98.3°F | Resp 16 | Wt 211.0 lb

## 2013-09-30 DIAGNOSIS — J45909 Unspecified asthma, uncomplicated: Secondary | ICD-10-CM

## 2013-09-30 DIAGNOSIS — R972 Elevated prostate specific antigen [PSA]: Secondary | ICD-10-CM

## 2013-09-30 DIAGNOSIS — I10 Essential (primary) hypertension: Secondary | ICD-10-CM

## 2013-09-30 DIAGNOSIS — Z0000 Encounter for general adult medical examination without abnormal findings: Secondary | ICD-10-CM

## 2013-09-30 DIAGNOSIS — M25512 Pain in left shoulder: Secondary | ICD-10-CM

## 2013-09-30 DIAGNOSIS — N259 Disorder resulting from impaired renal tubular function, unspecified: Secondary | ICD-10-CM

## 2013-09-30 DIAGNOSIS — Z23 Encounter for immunization: Secondary | ICD-10-CM

## 2013-09-30 DIAGNOSIS — I2510 Atherosclerotic heart disease of native coronary artery without angina pectoris: Secondary | ICD-10-CM

## 2013-09-30 DIAGNOSIS — M2550 Pain in unspecified joint: Secondary | ICD-10-CM

## 2013-09-30 DIAGNOSIS — L570 Actinic keratosis: Secondary | ICD-10-CM

## 2013-09-30 DIAGNOSIS — M25519 Pain in unspecified shoulder: Secondary | ICD-10-CM

## 2013-09-30 NOTE — Assessment & Plan Note (Signed)
See Procedure

## 2013-09-30 NOTE — Progress Notes (Signed)
Pre visit review using our clinic review tool, if applicable. No additional management support is needed unless otherwise documented below in the visit note. 

## 2013-09-30 NOTE — Progress Notes (Signed)
Subjective:    HPI  C/o occ wheezing  He is s/p L shoulder rotator cuff surgery by Dr Shara Blazing  The patient has been doing well overall without major physical or psychological issues going on lately, except for L shoulder pain for several months - he had 3 injections prior.   The patient presents for a follow-up of  chronic stable CAD, hypertension, chronic dyslipidemia, hypogonadism controlled with medicines.  SBP 130-140 at home  Wt Readings from Last 3 Encounters:  09/30/13 211 lb (95.709 kg)  06/19/13 210 lb (95.255 kg)  06/19/13 211 lb (95.709 kg)   BP Readings from Last 3 Encounters:  09/30/13 134/90  06/19/13 147/76  06/19/13 150/80   Past Medical History  Diagnosis Date  . Coronary atherosclerosis of unspecified type of vessel, native or graft   . Old myocardial infarction   . Unspecified essential hypertension   . Pure hypercholesterolemia   . Long term (current) use of anticoagulants   . Other specified cardiac dysrhythmias(427.89)   . Esophageal reflux   . Other malaise and fatigue   . Other testicular hypofunction   . Personal history of colonic polyps   . Osteoporosis, unspecified   . Calculus of gallbladder without mention of cholecystitis   . Cholelithiasis   . Celiac disease   . Loss of weight   . Unspecified asthma(493.90)   . Diarrhea   . Unspecified hypothyroidism   . Gout, unspecified   . Benign neoplasm of colon   . Renal insufficiency     chronic  . Hyperparathyroidism    Past Surgical History  Procedure Laterality Date  . Knee surgery      right  . Coronary stent placement  2011    Cypher; in distal circumflex artery  . Cystoscopy  1998    reports that he has never smoked. He does not have any smokeless tobacco history on file. He reports that he does not drink alcohol or use illicit drugs. family history includes Cancer in his father; Hypertension in an other family member; Lymphoma in his father; Vision loss in his maternal  grandmother. There is no history of Asthma or Colon cancer. Allergies  Allergen Reactions  . Allantoin-Pramoxine   . Metoprolol Tartrate     REACTION: fatigue    Current Outpatient Prescriptions on File Prior to Visit  Medication Sig Dispense Refill  . aspirin 81 MG tablet Take 81 mg by mouth daily.        . B-D 3CC LUER-LOK SYR 23GX1" 23G X 1" 3 ML MISC Use as directed  by prescriber  10 each  0  . calcitRIOL (ROCALTROL) 0.25 MCG capsule Take 1 capsule (0.25 mcg total) by mouth daily.  90 capsule  3  . carvedilol (COREG) 25 MG tablet Take 1 tablet (25 mg total) by mouth 2 (two) times daily with a meal.  180 tablet  3  . clopidogrel (PLAVIX) 75 MG tablet Take 1 tablet (75 mg total) by mouth daily.  90 tablet  3  . fish oil-omega-3 fatty acids 1000 MG capsule Take 1 g by mouth daily.        . Fluticasone-Salmeterol (ADVAIR DISKUS) 100-50 MCG/DOSE AEPB Inhale 1 puff into the lungs 2 (two) times daily.  1 each  5  . levothyroxine (SYNTHROID, LEVOTHROID) 112 MCG tablet Take 1 tablet (112 mcg total) by mouth daily.  90 tablet  3  . Multiple Vitamins-Minerals (MULTIVITAMIN,TX-MINERALS) tablet Take 1 tablet by mouth daily.        Marland Kitchen  nitroGLYCERIN (NITROSTAT) 0.4 MG SL tablet Place 0.4 mg under the tongue every 5 (five) minutes as needed.        Marland Kitchen olmesartan (BENICAR) 20 MG tablet Take 10 mg by mouth daily.      . sildenafil (VIAGRA) 100 MG tablet Take 100 mg by mouth daily as needed.        . simvastatin (ZOCOR) 10 MG tablet Take 1 tablet (10 mg total) by mouth at bedtime.  90 tablet  3  . testosterone cypionate (DEPOTESTOTERONE CYPIONATE) 200 MG/ML injection INJECT 2ML (400MG) EVERY 2 WEEKS  10 mL  2   No current facility-administered medications on file prior to visit.      Review of Systems  Constitutional: Negative for appetite change, fatigue and unexpected weight change.  HENT: Negative for congestion, nosebleeds, sneezing, sore throat and trouble swallowing.   Eyes: Negative for  itching and visual disturbance.  Respiratory: Positive for wheezing. Negative for cough.   Cardiovascular: Negative for chest pain, palpitations and leg swelling.  Gastrointestinal: Negative for nausea, diarrhea, blood in stool and abdominal distention.  Genitourinary: Negative for frequency and hematuria.  Musculoskeletal: Negative for back pain, gait problem, joint swelling and neck pain.  Skin: Negative for rash.  Neurological: Negative for dizziness, tremors, speech difficulty and weakness.  Psychiatric/Behavioral: Negative for sleep disturbance, dysphoric mood and agitation. The patient is nervous/anxious.        Objective:   Physical Exam  Constitutional: He is oriented to person, place, and time. He appears well-developed.  HENT:  Mouth/Throat: Oropharynx is clear and moist.  Eyes: Conjunctivae are normal. Pupils are equal, round, and reactive to light.  Neck: Normal range of motion. No JVD present. No thyromegaly present.  Cardiovascular: Normal rate, regular rhythm, normal heart sounds and intact distal pulses.  Exam reveals no gallop and no friction rub.   No murmur heard. Pulmonary/Chest: Effort normal and breath sounds normal. No respiratory distress. He has no wheezes. He has no rales. He exhibits no tenderness.  Abdominal: Soft. Bowel sounds are normal. He exhibits no distension and no mass. There is no tenderness. There is no rebound and no guarding.  Musculoskeletal: Normal range of motion. He exhibits no edema and no tenderness.  Lymphadenopathy:    He has no cervical adenopathy.  Neurological: He is alert and oriented to person, place, and time. He has normal reflexes. No cranial nerve deficit. He exhibits normal muscle tone. Coordination normal.  Skin: Skin is warm and dry. No rash noted.  Psychiatric: His behavior is normal. Judgment and thought content normal.   L shoulder is less tender w/ROM AKs  Lab Results  Component Value Date   WBC 7.9 12/12/2012   HGB  17.0 12/12/2012   HCT 52.3* 12/12/2012   PLT 107.0 Repeated and verified X2.* 12/12/2012   CHOL 165 09/10/2012   TRIG 188.0* 09/10/2012   HDL 32.60* 09/10/2012   ALT 28 12/12/2012   AST 23 12/12/2012   NA 138 06/13/2013   K 4.8 06/13/2013   CL 103 06/13/2013   CREATININE 1.9* 06/13/2013   BUN 15 06/13/2013   CO2 30 06/13/2013   TSH 4.29 12/12/2012   PSA 3.72 12/19/2012   HGBA1C 5.4 05/05/2009     Procedure Note :     Procedure : Cryosurgery   Indication:  Wart(s)  Actinic keratosis(es)   Risks including unsuccessful procedure , bleeding, infection, bruising, scar, a need for a repeat  procedure and others were explained to the patient in detail  as well as the benefits. Informed consent was obtained verbally.    5 lesion(s)  on  face  was/were treated with liquid nitrogen on a Q-tip in a usual fasion . Band-Aid was applied and antibiotic ointment was given for a later use.   Tolerated well. Complications none.       Assessment & Plan:

## 2013-09-30 NOTE — Assessment & Plan Note (Signed)
Continue with current prescription therapy as reflected on the Med list.  

## 2013-09-30 NOTE — Patient Instructions (Signed)
  Milk free trial (no milk, ice cream, cheese and yogurt) for 4-6 weeks. OK to use almond, coconut, rice milk. "Almond breeze" brand tastes good.   Postprocedure instructions :     Keep the wounds clean. You can wash them with liquid soap and water. Pat dry with gauze or a Kleenex tissue  Before applying antibiotic ointment and a Band-Aid.   You need to report immediately  if  any signs of infection develop.

## 2013-09-30 NOTE — Assessment & Plan Note (Signed)
  Milk free trial (no milk, ice cream, cheese and yogurt) for 4-6 weeks. OK to use almond, coconut, rice or soy milk. "Almond breeze" brand tastes good.

## 2013-09-30 NOTE — Assessment & Plan Note (Signed)
Doing good

## 2013-09-30 NOTE — Assessment & Plan Note (Signed)
Dr Shara Blazing - s/p surgery 9/14

## 2013-09-30 NOTE — Assessment & Plan Note (Signed)
Hold statin x 2 wks

## 2013-10-10 ENCOUNTER — Other Ambulatory Visit: Payer: Self-pay | Admitting: Internal Medicine

## 2013-11-27 ENCOUNTER — Encounter: Payer: Self-pay | Admitting: Internal Medicine

## 2013-11-27 ENCOUNTER — Ambulatory Visit (INDEPENDENT_AMBULATORY_CARE_PROVIDER_SITE_OTHER): Payer: Managed Care, Other (non HMO) | Admitting: Internal Medicine

## 2013-11-27 VITALS — BP 150/100 | HR 80 | Temp 98.6°F | Resp 16 | Wt 213.0 lb

## 2013-11-27 DIAGNOSIS — F419 Anxiety disorder, unspecified: Secondary | ICD-10-CM | POA: Insufficient documentation

## 2013-11-27 DIAGNOSIS — F411 Generalized anxiety disorder: Secondary | ICD-10-CM

## 2013-11-27 MED ORDER — CLONAZEPAM 0.25 MG PO TBDP: 0.2500 mg | ORAL_TABLET | Freq: Two times a day (BID) | ORAL | Status: DC | PRN

## 2013-11-27 NOTE — Assessment & Plan Note (Addendum)
Situational - family stress Discussed Family counseling suggeted Clonazepam prn

## 2013-11-27 NOTE — Progress Notes (Signed)
Subjective:    Anxiety Presents for initial visit. Symptoms include nervous/anxious behavior. Patient reports no chest pain, dizziness, nausea, palpitations or suicidal ideas. Symptoms occur most days. The severity of symptoms is moderate. The symptoms are aggravated by family issues. The quality of sleep is poor.   His past medical history is significant for anxiety/panic attacks and depression. There is no history of suicide attempts.     He is s/p L shoulder rotator cuff surgery by Dr Shara Blazing  The patient has been doing well overall without major physical or psychological issues going on lately, except for L shoulder pain for several months - he had 3 injections prior.   The patient presents for a follow-up of  chronic stable CAD, hypertension, chronic dyslipidemia, hypogonadism controlled with medicines.  SBP 130-140 at home  Wt Readings from Last 3 Encounters:  11/27/13 213 lb (96.616 kg)  09/30/13 211 lb (95.709 kg)  06/19/13 210 lb (95.255 kg)   BP Readings from Last 3 Encounters:  11/27/13 150/100  09/30/13 134/90  06/19/13 147/76   Past Medical History  Diagnosis Date  . Coronary atherosclerosis of unspecified type of vessel, native or graft   . Old myocardial infarction   . Unspecified essential hypertension   . Pure hypercholesterolemia   . Long term (current) use of anticoagulants   . Other specified cardiac dysrhythmias(427.89)   . Esophageal reflux   . Other malaise and fatigue   . Other testicular hypofunction   . Personal history of colonic polyps   . Osteoporosis, unspecified   . Calculus of gallbladder without mention of cholecystitis   . Cholelithiasis   . Celiac disease   . Loss of weight   . Unspecified asthma(493.90)   . Diarrhea   . Unspecified hypothyroidism   . Gout, unspecified   . Benign neoplasm of colon   . Renal insufficiency     chronic  . Hyperparathyroidism    Past Surgical History  Procedure Laterality Date  . Knee surgery       right  . Coronary stent placement  2011    Cypher; in distal circumflex artery  . Cystoscopy  1998  . Shoulder surgery Left 9/14    Dr Shara Blazing    reports that he has never smoked. He does not have any smokeless tobacco history on file. He reports that he does not drink alcohol or use illicit drugs. family history includes Cancer in his father; Hypertension in an other family member; Lymphoma in his father; Vision loss in his maternal grandmother. There is no history of Asthma or Colon cancer. Allergies  Allergen Reactions  . Allantoin-Pramoxine   . Metoprolol Tartrate     REACTION: fatigue    Current Outpatient Prescriptions on File Prior to Visit  Medication Sig Dispense Refill  . aspirin 81 MG tablet Take 81 mg by mouth daily.        . B-D 3CC LUER-LOK SYR 23GX1" 23G X 1" 3 ML MISC Use as directed  by prescriber  10 each  0  . calcitRIOL (ROCALTROL) 0.25 MCG capsule Take 1 capsule (0.25 mcg total) by mouth daily.  90 capsule  3  . carvedilol (COREG) 25 MG tablet Take 1 tablet (25 mg total) by mouth 2 (two) times daily with a meal.  180 tablet  3  . clopidogrel (PLAVIX) 75 MG tablet Take 1 tablet (75 mg total) by mouth daily.  90 tablet  3  . fish oil-omega-3 fatty acids 1000 MG capsule Take 1  g by mouth daily.        . Fluticasone-Salmeterol (ADVAIR DISKUS) 100-50 MCG/DOSE AEPB Inhale 1 puff into the lungs 2 (two) times daily.  1 each  5  . levothyroxine (SYNTHROID, LEVOTHROID) 112 MCG tablet Take 1 tablet (112 mcg total) by mouth daily.  90 tablet  3  . Multiple Vitamins-Minerals (MULTIVITAMIN,TX-MINERALS) tablet Take 1 tablet by mouth daily.        . nitroGLYCERIN (NITROSTAT) 0.4 MG SL tablet Place 0.4 mg under the tongue every 5 (five) minutes as needed.        Marland Kitchen olmesartan (BENICAR) 20 MG tablet Take 10 mg by mouth daily.      . sildenafil (VIAGRA) 100 MG tablet Take 100 mg by mouth daily as needed.        . simvastatin (ZOCOR) 10 MG tablet Take 1 tablet (10 mg total) by mouth  at bedtime.  90 tablet  3  . testosterone cypionate (DEPOTESTOTERONE CYPIONATE) 200 MG/ML injection INJECT 2ML EVERY 2 WEEKS  10 mL  5   No current facility-administered medications on file prior to visit.      Review of Systems  Constitutional: Negative for appetite change, fatigue and unexpected weight change.  HENT: Negative for congestion, nosebleeds, sneezing, sore throat and trouble swallowing.   Eyes: Negative for itching and visual disturbance.  Respiratory: Positive for wheezing. Negative for cough.   Cardiovascular: Negative for chest pain, palpitations and leg swelling.  Gastrointestinal: Negative for nausea, diarrhea, blood in stool and abdominal distention.  Genitourinary: Negative for frequency and hematuria.  Musculoskeletal: Negative for back pain, gait problem, joint swelling and neck pain.  Skin: Negative for rash.  Neurological: Negative for dizziness, tremors, speech difficulty and weakness.  Psychiatric/Behavioral: Negative for suicidal ideas, sleep disturbance, dysphoric mood and agitation. The patient is nervous/anxious.        Objective:   Physical Exam  Constitutional: He is oriented to person, place, and time. He appears well-developed.  HENT:  Mouth/Throat: Oropharynx is clear and moist.  Eyes: Conjunctivae are normal. Pupils are equal, round, and reactive to light.  Neck: Normal range of motion. No JVD present. No thyromegaly present.  Cardiovascular: Normal rate, regular rhythm, normal heart sounds and intact distal pulses.  Exam reveals no gallop and no friction rub.   No murmur heard. Pulmonary/Chest: Effort normal and breath sounds normal. No respiratory distress. He has no wheezes. He has no rales. He exhibits no tenderness.  Abdominal: Soft. Bowel sounds are normal. He exhibits no distension and no mass. There is no tenderness. There is no rebound and no guarding.  Musculoskeletal: Normal range of motion. He exhibits no edema and no tenderness.   Lymphadenopathy:    He has no cervical adenopathy.  Neurological: He is alert and oriented to person, place, and time. He has normal reflexes. No cranial nerve deficit. He exhibits normal muscle tone. Coordination normal.  Skin: Skin is warm and dry. No rash noted.  Psychiatric: His behavior is normal. Judgment and thought content normal.   L shoulder is less tender w/ROM AKs  Lab Results  Component Value Date   WBC 7.9 12/12/2012   HGB 17.0 12/12/2012   HCT 52.3* 12/12/2012   PLT 107.0 Repeated and verified X2.* 12/12/2012   CHOL 165 09/10/2012   TRIG 188.0* 09/10/2012   HDL 32.60* 09/10/2012   ALT 28 12/12/2012   AST 23 12/12/2012   NA 138 06/13/2013   K 4.8 06/13/2013   CL 103 06/13/2013   CREATININE  1.9* 06/13/2013   BUN 15 06/13/2013   CO2 30 06/13/2013   TSH 4.29 12/12/2012   PSA 3.72 12/19/2012   HGBA1C 5.4 05/05/2009         Assessment & Plan:

## 2013-11-27 NOTE — Progress Notes (Signed)
Pre visit review using our clinic review tool, if applicable. No additional management support is needed unless otherwise documented below in the visit note. 

## 2013-12-04 ENCOUNTER — Encounter: Payer: Self-pay | Admitting: Cardiovascular Disease

## 2013-12-04 ENCOUNTER — Ambulatory Visit (INDEPENDENT_AMBULATORY_CARE_PROVIDER_SITE_OTHER): Payer: Managed Care, Other (non HMO) | Admitting: Cardiovascular Disease

## 2013-12-04 VITALS — BP 130/78 | HR 64 | Ht 72.0 in | Wt 211.8 lb

## 2013-12-04 DIAGNOSIS — I2510 Atherosclerotic heart disease of native coronary artery without angina pectoris: Secondary | ICD-10-CM

## 2013-12-04 NOTE — Patient Instructions (Signed)
Your physician recommends that you continue on your current medications as directed. Please refer to the Current Medication list given to you today.  Your physician wants you to follow-up in: 1 year with Dr. Burt Knack.  You will receive a reminder letter in the mail two months in advance. If you don't receive a letter, please call our office to schedule the follow-up appointment.

## 2013-12-04 NOTE — Progress Notes (Signed)
HPI:  73 year old gentleman presenting for followup evaluation. The patient has coronary artery disease. He has been followed by Dr. Lia Foyer. This is my first encounter with him. The patient also has chronic kidney disease, hypertension, and hyperlipidemia.  He initially presented in 2005 with an inferior wall myocardial infarction. He was treated with overlapping Cypher stents in the right coronary artery. He underwent staged PCI of the left circumflex during his index hospitalization. He has been maintained on long-term dual antiplatelet therapy with aspirin and Plavix. He was noted to have diffuse nonobstructive disease of the LAD. LV function was preserved at the time of his myocardial infarction with an estimated ejection fraction of 60%.  The patient is doing well. He has been skiing with his boys out in [place] this year. He has some limitation with exertional dyspnea at high altitude. He reports episodic wheezing. He denies chest pain or pressure, edema, palpitations, orthopnea, or PND. He's had no bleeding problems.  Lipid Panel     Component Value Date/Time   CHOL 165 09/10/2012 1044   TRIG 188.0* 09/10/2012 1044   HDL 32.60* 09/10/2012 1044   CHOLHDL 5 09/10/2012 1044   VLDL 37.6 09/10/2012 1044   LDLCALC 95 09/10/2012 1044    Outpatient Encounter Prescriptions as of 12/04/2013  Medication Sig  . aspirin 81 MG tablet Take 81 mg by mouth daily.    . B-D 3CC LUER-LOK SYR 23GX1" 23G X 1" 3 ML MISC Use as directed  by prescriber  . calcitRIOL (ROCALTROL) 0.25 MCG capsule Take 1 capsule (0.25 mcg total) by mouth daily.  . carvedilol (COREG) 25 MG tablet Take 1 tablet (25 mg total) by mouth 2 (two) times daily with a meal.  . clonazePAM (KLONOPIN) 0.25 MG disintegrating tablet Take 1 tablet (0.25 mg total) by mouth 2 (two) times daily as needed (anxiety).  . clopidogrel (PLAVIX) 75 MG tablet Take 1 tablet (75 mg total) by mouth daily.  . fish oil-omega-3 fatty acids 1000 MG capsule  Take 1 g by mouth daily.    . Fluticasone-Salmeterol (ADVAIR DISKUS) 100-50 MCG/DOSE AEPB Inhale 1 puff into the lungs 2 (two) times daily.  Marland Kitchen levothyroxine (SYNTHROID, LEVOTHROID) 112 MCG tablet Take 1 tablet (112 mcg total) by mouth daily.  . Multiple Vitamins-Minerals (MULTIVITAMIN,TX-MINERALS) tablet Take 1 tablet by mouth daily.    . nitroGLYCERIN (NITROSTAT) 0.4 MG SL tablet Place 0.4 mg under the tongue every 5 (five) minutes as needed.    Marland Kitchen olmesartan (BENICAR) 20 MG tablet Take 10 mg by mouth daily.  . sildenafil (VIAGRA) 100 MG tablet Take 100 mg by mouth daily as needed.    . simvastatin (ZOCOR) 10 MG tablet Take 1 tablet (10 mg total) by mouth at bedtime.  Marland Kitchen testosterone cypionate (DEPOTESTOTERONE CYPIONATE) 200 MG/ML injection INJECT 2ML EVERY 2 WEEKS    Allergies  Allergen Reactions  . Allantoin-Pramoxine   . Metoprolol Tartrate     REACTION: fatigue    Past Medical History  Diagnosis Date  . Coronary atherosclerosis of unspecified type of vessel, native or graft   . Old myocardial infarction   . Unspecified essential hypertension   . Pure hypercholesterolemia   . Long term (current) use of anticoagulants   . Other specified cardiac dysrhythmias(427.89)   . Esophageal reflux   . Other malaise and fatigue   . Other testicular hypofunction   . Personal history of colonic polyps   . Osteoporosis, unspecified   . Calculus of gallbladder without mention of cholecystitis   .  Cholelithiasis   . Celiac disease   . Loss of weight   . Unspecified asthma(493.90)   . Diarrhea   . Unspecified hypothyroidism   . Gout, unspecified   . Benign neoplasm of colon   . Renal insufficiency     chronic  . Hyperparathyroidism     ROS: Negative except as per HPI  BP 130/78  Pulse 64  Ht 6' (1.829 m)  Wt 211 lb 12.8 oz (96.072 kg)  BMI 28.72 kg/m2  PHYSICAL EXAM: Pt is alert and oriented, NAD HEENT: normal Neck: JVP - normal, carotids 2+= without bruits Lungs: CTA  bilaterally CV: RRR without murmur or gallop Abd: soft, NT, Positive BS, no hepatomegaly Ext: no C/C/E, distal pulses intact and equal Skin: warm/dry no rash  EKG:  Normal sinus rhythm 64 beats per minute, within normal limits.  ASSESSMENT AND PLAN: 1. Coronary artery disease, native vessel. The patient is stable without symptoms of angina. We discussed issues around long-term dual antiplatelet therapy. Considering multiple first-generation drug-eluting stents, with stent implantation in the setting of acute MI, he is at higher than normal risk of late stent related problems. He will continue on long-term treatment. He has been able to interrupt antiplatelet therapy for procedures in the past without problems. His medical regime was reviewed and will be continued.  2. Hypertension. Blood pressure is well controlled on a combination of carvedilol and Benicar.  3. Hyperlipidemia. Patient takes simvastatin. He is followed by Dr. Alain Marion.  He has upcoming labs with his annual physical exam.  Sherren Mocha 12/04/2013 8:52 AM

## 2013-12-18 ENCOUNTER — Other Ambulatory Visit: Payer: Self-pay | Admitting: Internal Medicine

## 2013-12-19 MED ORDER — "SYRINGE/NEEDLE (DISP) 23G X 1"" 3 ML MISC": Status: DC

## 2013-12-19 MED ORDER — OLMESARTAN MEDOXOMIL 20 MG PO TABS: 10.0000 mg | ORAL_TABLET | Freq: Every day | ORAL | Status: DC

## 2013-12-19 MED ORDER — CARVEDILOL 25 MG PO TABS
25.0000 mg | ORAL_TABLET | Freq: Two times a day (BID) | ORAL | Status: DC
Start: 2013-12-19 — End: 2014-10-08

## 2013-12-20 ENCOUNTER — Other Ambulatory Visit: Payer: Self-pay | Admitting: Internal Medicine

## 2013-12-27 MED ORDER — TESTOSTERONE CYPIONATE 200 MG/ML IM SOLN: INTRAMUSCULAR | Status: DC

## 2013-12-27 NOTE — Addendum Note (Signed)
Addended by: Earnstine Regal on: 12/27/2013 10:47 AM   Modules accepted: Orders

## 2014-01-02 ENCOUNTER — Ambulatory Visit (INDEPENDENT_AMBULATORY_CARE_PROVIDER_SITE_OTHER): Payer: Managed Care, Other (non HMO) | Admitting: Internal Medicine

## 2014-01-02 ENCOUNTER — Encounter: Payer: Self-pay | Admitting: Internal Medicine

## 2014-01-02 VITALS — BP 148/60 | HR 76 | Temp 97.5°F | Resp 16 | Wt 216.0 lb

## 2014-01-02 DIAGNOSIS — I2510 Atherosclerotic heart disease of native coronary artery without angina pectoris: Secondary | ICD-10-CM

## 2014-01-02 DIAGNOSIS — J45901 Unspecified asthma with (acute) exacerbation: Secondary | ICD-10-CM

## 2014-01-02 DIAGNOSIS — E291 Testicular hypofunction: Secondary | ICD-10-CM

## 2014-01-02 DIAGNOSIS — J45909 Unspecified asthma, uncomplicated: Secondary | ICD-10-CM

## 2014-01-02 MED ORDER — OLMESARTAN MEDOXOMIL 20 MG PO TABS: 20.0000 mg | ORAL_TABLET | Freq: Every day | ORAL | Status: DC

## 2014-01-02 MED ORDER — FLUTICASONE FUROATE-VILANTEROL 100-25 MCG/INH IN AEPB: 1.0000 | INHALATION_SPRAY | Freq: Every day | RESPIRATORY_TRACT | Status: DC

## 2014-01-02 MED ORDER — PREDNISONE 10 MG PO TABS: ORAL_TABLET | ORAL | Status: DC

## 2014-01-02 NOTE — Assessment & Plan Note (Signed)
Continue with current prescription therapy as reflected on the Med list.  

## 2014-01-02 NOTE — Assessment & Plan Note (Addendum)
S/p recent chemical fumes exposure Pulm consult Dr Nena Polio  Prednisone 10 mg: take 4 tabs a day x 3 days; then 3 tabs a day x 4 days; then 2 tabs a day x 4 days, then 1 tab a day x 6 days, then stop. Take pc.

## 2014-01-02 NOTE — Progress Notes (Signed)
Pre visit review using our clinic review tool, if applicable. No additional management support is needed unless otherwise documented below in the visit note. 

## 2014-01-02 NOTE — Progress Notes (Signed)
Subjective:   C/o LBP, L leg pain x2 wks - Celebrex helped  C/o cough, SOB after he inhaled paint/sealant    Anxiety Presents for follow-up visit. Symptoms include nervous/anxious behavior. Patient reports no chest pain, dizziness, nausea, palpitations or suicidal ideas. Symptoms occur occasionally. The severity of symptoms is mild. The symptoms are aggravated by family issues. The quality of sleep is good. Nighttime awakenings: occasional.   His past medical history is significant for anxiety/panic attacks and depression. There is no history of suicide attempts. The treatment provided significant relief. Compliance with prior treatments has been good.     He is s/p L shoulder rotator cuff surgery by Dr Shara Blazing  The patient has been doing well overall without major physical or psychological issues going on lately, except for L shoulder pain for several months - he had 3 injections prior.   The patient presents for a follow-up of  chronic stable CAD, hypertension, chronic dyslipidemia, hypogonadism controlled with medicines.  SBP 130-140 at home  Wt Readings from Last 3 Encounters:  01/02/14 216 lb (97.977 kg)  12/04/13 211 lb 12.8 oz (96.072 kg)  11/27/13 213 lb (96.616 kg)   BP Readings from Last 3 Encounters:  01/02/14 148/60  12/04/13 130/78  11/27/13 150/100   Past Medical History  Diagnosis Date  . Coronary atherosclerosis of unspecified type of vessel, native or graft   . Old myocardial infarction   . Unspecified essential hypertension   . Pure hypercholesterolemia   . Long term (current) use of anticoagulants   . Other specified cardiac dysrhythmias(427.89)   . Esophageal reflux   . Other malaise and fatigue   . Other testicular hypofunction   . Personal history of colonic polyps   . Osteoporosis, unspecified   . Calculus of gallbladder without mention of cholecystitis   . Cholelithiasis   . Celiac disease   . Loss of weight   . Unspecified asthma(493.90)    . Diarrhea   . Unspecified hypothyroidism   . Gout, unspecified   . Benign neoplasm of colon   . Renal insufficiency     chronic  . Hyperparathyroidism    Past Surgical History  Procedure Laterality Date  . Knee surgery      right  . Coronary stent placement  2011    Cyp[place]; in distal circumflex artery  . Cystoscopy  1998  . Shoulder surgery Left 9/14    Dr Shara Blazing    reports that he has never smoked. He does not have any smokeless tobacco history on file. He reports that he does not drink alcohol or use illicit drugs. family history includes Cancer in his father; Hypertension in an other family member; Lymphoma in his father; Vision loss in his maternal grandmother. There is no history of Asthma or Colon cancer. Allergies  Allergen Reactions  . Allantoin-Pramoxine   . Metoprolol Tartrate     REACTION: fatigue    Current Outpatient Prescriptions on File Prior to Visit  Medication Sig Dispense Refill  . aspirin 81 MG tablet Take 81 mg by mouth daily.        . calcitRIOL (ROCALTROL) 0.25 MCG capsule Take 1 capsule (0.25 mcg total) by mouth daily.  90 capsule  3  . carvedilol (COREG) 25 MG tablet Take 1 tablet (25 mg total) by mouth 2 (two) times daily with a meal.  180 tablet  2  . clonazePAM (KLONOPIN) 0.25 MG disintegrating tablet Take 1 tablet (0.25 mg total) by mouth 2 (two) times daily  as needed (anxiety).  60 tablet  1  . clopidogrel (PLAVIX) 75 MG tablet Take 1 tablet (75 mg total) by mouth daily.  90 tablet  3  . fish oil-omega-3 fatty acids 1000 MG capsule Take 1 g by mouth daily.        . Fluticasone-Salmeterol (ADVAIR DISKUS) 100-50 MCG/DOSE AEPB Inhale 1 puff into the lungs 2 (two) times daily.  1 each  5  . levothyroxine (SYNTHROID, LEVOTHROID) 112 MCG tablet Take 1 tablet (112 mcg total) by mouth daily.  90 tablet  3  . Multiple Vitamins-Minerals (MULTIVITAMIN,TX-MINERALS) tablet Take 1 tablet by mouth daily.        . nitroGLYCERIN (NITROSTAT) 0.4 MG SL tablet Place  0.4 mg under the tongue every 5 (five) minutes as needed.        Marland Kitchen olmesartan (BENICAR) 20 MG tablet Take 0.5 tablets (10 mg total) by mouth daily.  30 tablet  3  . sildenafil (VIAGRA) 100 MG tablet Take 100 mg by mouth daily as needed.        . simvastatin (ZOCOR) 10 MG tablet Take 1 tablet (10 mg total) by mouth at bedtime.  90 tablet  3  . SYRINGE-NEEDLE, DISP, 3 ML (B-D 3CC LUER-LOK SYR 23GX1") 23G X 1" 3 ML MISC Use as directed  by prescriber  10 each  0  . testosterone cypionate (DEPOTESTOTERONE CYPIONATE) 200 MG/ML injection INJECT 2ML EVERY 2 WEEKS  10 mL  5   No current facility-administered medications on file prior to visit.      Review of Systems  Constitutional: Negative for appetite change, fatigue and unexpected weight change.  HENT: Negative for congestion, nosebleeds, sneezing, sore throat and trouble swallowing.   Eyes: Negative for itching and visual disturbance.  Respiratory: Positive for wheezing. Negative for cough.   Cardiovascular: Negative for chest pain, palpitations and leg swelling.  Gastrointestinal: Negative for nausea, diarrhea, blood in stool and abdominal distention.  Genitourinary: Negative for frequency and hematuria.  Musculoskeletal: Negative for back pain, gait problem, joint swelling and neck pain.  Skin: Negative for rash.  Neurological: Negative for dizziness, tremors, speech difficulty and weakness.  Psychiatric/Behavioral: Negative for suicidal ideas, sleep disturbance, dysphoric mood and agitation. The patient is nervous/anxious.        Objective:   Physical Exam  Constitutional: He is oriented to person, place, and time. He appears well-developed.  HENT:  Mouth/Throat: Oropharynx is clear and moist.  Eyes: Conjunctivae are normal. Pupils are equal, round, and reactive to light.  Neck: Normal range of motion. No JVD present. No thyromegaly present.  Cardiovascular: Normal rate, regular rhythm, normal heart sounds and intact distal pulses.   Exam reveals no gallop and no friction rub.   No murmur heard. Pulmonary/Chest: Effort normal and breath sounds normal. No respiratory distress. He has no wheezes. He has no rales. He exhibits no tenderness.  Abdominal: Soft. Bowel sounds are normal. He exhibits no distension and no mass. There is no tenderness. There is no rebound and no guarding.  Musculoskeletal: Normal range of motion. He exhibits no edema and no tenderness.  Lymphadenopathy:    He has no cervical adenopathy.  Neurological: He is alert and oriented to person, place, and time. He has normal reflexes. No cranial nerve deficit. He exhibits normal muscle tone. Coordination normal.  Skin: Skin is warm and dry. No rash noted.  Psychiatric: His behavior is normal. Judgment and thought content normal.  R LS is tender L shoulder is less tender w/ROM  AKs  Lab Results  Component Value Date   WBC 7.9 12/12/2012   HGB 17.0 12/12/2012   HCT 52.3* 12/12/2012   PLT 107.0 Repeated and verified X2.* 12/12/2012   CHOL 165 09/10/2012   TRIG 188.0* 09/10/2012   HDL 32.60* 09/10/2012   ALT 28 12/12/2012   AST 23 12/12/2012   NA 138 06/13/2013   K 4.8 06/13/2013   CL 103 06/13/2013   CREATININE 1.9* 06/13/2013   BUN 15 06/13/2013   CO2 30 06/13/2013   TSH 4.29 12/12/2012   PSA 3.72 12/19/2012   HGBA1C 5.4 05/05/2009    I personally provided Breo inhaler use teaching. After the teaching patient was able to demonstrate it's use effectively. All questions were answered      Assessment & Plan:

## 2014-01-17 ENCOUNTER — Encounter: Payer: Self-pay | Admitting: Critical Care Medicine

## 2014-01-17 ENCOUNTER — Ambulatory Visit (INDEPENDENT_AMBULATORY_CARE_PROVIDER_SITE_OTHER): Payer: Managed Care, Other (non HMO) | Admitting: Critical Care Medicine

## 2014-01-17 VITALS — BP 142/86 | HR 61 | Temp 98.1°F | Ht 72.0 in | Wt 217.4 lb

## 2014-01-17 DIAGNOSIS — R0989 Other specified symptoms and signs involving the circulatory and respiratory systems: Secondary | ICD-10-CM

## 2014-01-17 DIAGNOSIS — R0609 Other forms of dyspnea: Secondary | ICD-10-CM

## 2014-01-17 DIAGNOSIS — R0600 Dyspnea, unspecified: Secondary | ICD-10-CM

## 2014-01-17 DIAGNOSIS — R059 Cough, unspecified: Secondary | ICD-10-CM

## 2014-01-17 DIAGNOSIS — R05 Cough: Secondary | ICD-10-CM

## 2014-01-17 DIAGNOSIS — J45909 Unspecified asthma, uncomplicated: Secondary | ICD-10-CM

## 2014-01-17 MED ORDER — FLUTICASONE FUROATE-VILANTEROL 100-25 MCG/INH IN AEPB: 1.0000 | INHALATION_SPRAY | Freq: Every day | RESPIRATORY_TRACT | Status: DC

## 2014-01-17 NOTE — Patient Instructions (Signed)
Start Breo one puff daily A chest xray will be obtained today Finish prednisone  Return 2 months

## 2014-01-17 NOTE — Progress Notes (Signed)
Subjective:    Patient ID: Scott Oneal, male    DOB: 1941-02-15, 73 y.o.   MRN: [id number]  HPI Comments: Dyspnea for 67month, assoc wheezing while working in yard.   Dyspnea only with exeriton No real cough.  Life long never smoker, some passive smoke exposure with father  Shortness of Breath This is a new problem. The current episode started more than 1 month ago. The problem occurs daily. The problem has been unchanged. Associated symptoms include orthopnea, rhinorrhea and wheezing. Pertinent negatives include no abdominal pain, chest pain, claudication, coryza, ear pain, fever, headaches, hemoptysis, leg pain, leg swelling, neck pain, PND, rash, sore throat, sputum production, syncope or vomiting. The symptoms are aggravated by any activity, exercise, lying flat, weather changes, smoke and occupational exposure. Associated symptoms comments: Did have a cough with chemical exposure, but now is better with prednisone. He has tried beta agonist inhalers and steroid inhalers (breo helped but is not consistent in its use) for the symptoms. The treatment provided moderate relief. His past medical history is significant for CAD. There is no history of allergies, aspirin allergies, asthma, bronchiolitis, chronic lung disease, COPD, DVT, a heart failure, PE or pneumonia. (3 stents in 2005)    Past Medical History  Diagnosis Date  . Coronary atherosclerosis of unspecified type of vessel, native or graft   . Old myocardial infarction   . Unspecified essential hypertension   . Pure hypercholesterolemia   . Long term (current) use of anticoagulants   . Other specified cardiac dysrhythmias(427.89)   . Esophageal reflux   . Other malaise and fatigue   . Other testicular hypofunction   . Personal history of colonic polyps   . Osteoporosis, unspecified   . Calculus of gallbladder without mention of cholecystitis   . Cholelithiasis   . Celiac disease   . Loss of weight   . Unspecified  asthma(493.90)   . Diarrhea   . Unspecified hypothyroidism   . Gout, unspecified   . Benign neoplasm of colon   . Renal insufficiency     chronic  . Hyperparathyroidism      Family History  Problem Relation Age of Onset  . Hypertension    . Asthma Neg Hx   . Colon cancer Neg Hx   . Lymphoma Father   . Cancer Father     lymphoma  . Vision loss Maternal Grandmother      History   Social History  . Marital Status: Married    Spouse Name: BRaykwon Hobbs   Number of Children: N/A  . Years of Education: N/A   Occupational History  . retired     [employer], production  Social History Main Topics  . Smoking status: Never Smoker   . Smokeless tobacco: Never Used  . Alcohol Use: No  . Drug Use: No  . Sexual Activity: Yes   Other Topics Concern  . Not on file   Social History Narrative   Retired.   Regular Exercise-Yes; yoga & silver sneakers   Daily Caffeine Use.              Allergies  Allergen Reactions  . Allantoin-Pramoxine   . Metoprolol Tartrate     REACTION: fatigue     Outpatient Prescriptions Prior to Visit  Medication Sig Dispense Refill  . aspirin 81 MG tablet Take 81 mg by mouth daily.        . calcitRIOL (ROCALTROL) 0.25 MCG capsule Take 1 capsule (0.25 mcg total) by mouth  daily.  90 capsule  3  . carvedilol (COREG) 25 MG tablet Take 1 tablet (25 mg total) by mouth 2 (two) times daily with a meal.  180 tablet  2  . clonazePAM (KLONOPIN) 0.25 MG disintegrating tablet Take 1 tablet (0.25 mg total) by mouth 2 (two) times daily as needed (anxiety).  60 tablet  1  . clopidogrel (PLAVIX) 75 MG tablet Take 1 tablet (75 mg total) by mouth daily.  90 tablet  3  . fish oil-omega-3 fatty acids 1000 MG capsule Take 1 g by mouth daily.        Marland Kitchen levothyroxine (SYNTHROID, LEVOTHROID) 112 MCG tablet Take 1 tablet (112 mcg total) by mouth daily.  90 tablet  3  . Multiple Vitamins-Minerals (MULTIVITAMIN,TX-MINERALS) tablet Take 1 tablet by mouth daily.        .  nitroGLYCERIN (NITROSTAT) 0.4 MG SL tablet Place 0.4 mg under the tongue every 5 (five) minutes as needed.        . predniSONE (DELTASONE) 10 MG tablet Prednisone 10 mg: take 4 tabs a day x 3 days; then 3 tabs a day x 4 days; then 2 tabs a day x 4 days, then 1 tab a day x 6 days, then stop. Take pc.  38 tablet  1  . sildenafil (VIAGRA) 100 MG tablet Take 100 mg by mouth daily as needed.        . simvastatin (ZOCOR) 10 MG tablet Take 1 tablet (10 mg total) by mouth at bedtime.  90 tablet  3  . SYRINGE-NEEDLE, DISP, 3 ML (B-D 3CC LUER-LOK SYR 23GX1") 23G X 1" 3 ML MISC Use as directed  by prescriber  10 each  0  . testosterone cypionate (DEPOTESTOTERONE CYPIONATE) 200 MG/ML injection INJECT 2ML EVERY 2 WEEKS  10 mL  5  . Fluticasone Furoate-Vilanterol (BREO ELLIPTA) 100-25 MCG/INH AEPB Inhale 1 Act into the lungs daily.  1 each  5  . olmesartan (BENICAR) 20 MG tablet Take 1 tablet (20 mg total) by mouth daily.  30 tablet  11   No facility-administered medications prior to visit.      Review of Systems  Constitutional: Negative for fever, chills, diaphoresis, activity change, appetite change, fatigue and unexpected weight change.  HENT: Positive for rhinorrhea and sneezing. Negative for congestion, dental problem, ear discharge, ear pain, facial swelling, hearing loss, mouth sores, nosebleeds, postnasal drip, sinus pressure, sore throat, tinnitus, trouble swallowing and voice change.   Eyes: Negative for photophobia, discharge, itching and visual disturbance.  Respiratory: Positive for shortness of breath and wheezing. Negative for apnea, cough, hemoptysis, sputum production, choking, chest tightness and stridor.   Cardiovascular: Positive for orthopnea. Negative for chest pain, palpitations, claudication, leg swelling, syncope and PND.  Gastrointestinal: Negative for nausea, vomiting, abdominal pain, constipation, blood in stool and abdominal distention.  Genitourinary: Negative for dysuria,  urgency, frequency, hematuria, flank pain, decreased urine volume and difficulty urinating.  Musculoskeletal: Negative for arthralgias, back pain, gait problem, joint swelling, myalgias, neck pain and neck stiffness.  Skin: Negative for color change, pallor and rash.  Neurological: Negative for dizziness, tremors, seizures, syncope, speech difficulty, weakness, light-headedness, numbness and headaches.  Hematological: Negative for adenopathy. Does not bruise/bleed easily.  Psychiatric/Behavioral: Positive for sleep disturbance. Negative for confusion and agitation. The patient is nervous/anxious.        Objective:   Physical Exam Filed Vitals:   01/17/14 1535  BP: 142/86  Pulse: 61  Temp: 98.1 F (36.7 C)  TempSrc: Oral  Height: 6' (1.829 m)  Weight: 98.612 kg (217 lb 6.4 oz)  SpO2: 97%    Gen: Pleasant, well-nourished, in no distress,  normal affect  ENT: No lesions,  mouth clear,  oropharynx clear, no postnasal drip  Neck: No JVD, no TMG, no carotid bruits  Lungs: No use of accessory muscles, no dullness to percussion, distant breath sounds  Cardiovascular: RRR, heart sounds normal, no murmur or gallops, no peripheral edema  Abdomen: soft and NT, no HSM,  BS normal  Musculoskeletal: No deformities, no cyanosis or clubbing  Neuro: alert, non focal  Skin: Warm, no lesions or rashes  No results found.      Assessment & Plan:   Moderate persistent asthma without complication Moderate persistent asthma with stable airway function when using Breo but the patient has not been adherent to this are regular basis Note chest x-ray shows no active process on 01/17/2014 Plan Finish current course of prednisone Used Breo on a daily basis one puff daily   Updated Medication List Outpatient Encounter Prescriptions as of 01/17/2014  Medication Sig  . aspirin 81 MG tablet Take 81 mg by mouth daily.    . calcitRIOL (ROCALTROL) 0.25 MCG capsule Take 1 capsule (0.25 mcg total)  by mouth daily.  . carvedilol (COREG) 25 MG tablet Take 1 tablet (25 mg total) by mouth 2 (two) times daily with a meal.  . clonazePAM (KLONOPIN) 0.25 MG disintegrating tablet Take 1 tablet (0.25 mg total) by mouth 2 (two) times daily as needed (anxiety).  . clopidogrel (PLAVIX) 75 MG tablet Take 1 tablet (75 mg total) by mouth daily.  . fish oil-omega-3 fatty acids 1000 MG capsule Take 1 g by mouth daily.    . Fluticasone Furoate-Vilanterol 100-25 MCG/INH AEPB Inhale 1 puff into the lungs daily.  Marland Kitchen levothyroxine (SYNTHROID, LEVOTHROID) 112 MCG tablet Take 1 tablet (112 mcg total) by mouth daily.  . Multiple Vitamins-Minerals (MULTIVITAMIN,TX-MINERALS) tablet Take 1 tablet by mouth daily.    . nitroGLYCERIN (NITROSTAT) 0.4 MG SL tablet Place 0.4 mg under the tongue every 5 (five) minutes as needed.    Marland Kitchen olmesartan (BENICAR) 20 MG tablet Take 10 mg by mouth daily.  . predniSONE (DELTASONE) 10 MG tablet Prednisone 10 mg: take 4 tabs a day x 3 days; then 3 tabs a day x 4 days; then 2 tabs a day x 4 days, then 1 tab a day x 6 days, then stop. Take pc.  . sildenafil (VIAGRA) 100 MG tablet Take 100 mg by mouth daily as needed.    . simvastatin (ZOCOR) 10 MG tablet Take 1 tablet (10 mg total) by mouth at bedtime.  . SYRINGE-NEEDLE, DISP, 3 ML (B-D 3CC LUER-LOK SYR 23GX1") 23G X 1" 3 ML MISC Use as directed  by prescriber  . testosterone cypionate (DEPOTESTOTERONE CYPIONATE) 200 MG/ML injection INJECT 2ML EVERY 2 WEEKS  . [DISCONTINUED] Fluticasone Furoate-Vilanterol (BREO ELLIPTA) 100-25 MCG/INH AEPB Inhale 1 Act into the lungs daily.  . [DISCONTINUED] Fluticasone Furoate-Vilanterol 100-25 MCG/INH AEPB Inhale 1 Act into the lungs daily as needed.  . [DISCONTINUED] Fluticasone Furoate-Vilanterol 100-25 MCG/INH AEPB Inhale 1 Act into the lungs daily.  . [DISCONTINUED] olmesartan (BENICAR) 20 MG tablet Take 1 tablet (20 mg total) by mouth daily.

## 2014-01-18 NOTE — Assessment & Plan Note (Signed)
Moderate persistent asthma with stable airway function when using Breo but the patient has not been adherent to this are regular basis Note chest x-ray shows no active process on 01/17/2014 Plan Finish current course of prednisone Used Breo on a daily basis one puff daily

## 2014-01-20 ENCOUNTER — Ambulatory Visit (INDEPENDENT_AMBULATORY_CARE_PROVIDER_SITE_OTHER)
Admission: RE | Admit: 2014-01-20 | Discharge: 2014-01-20 | Disposition: A | Discharge status: Home / Self Care | Payer: Managed Care, Other (non HMO) | Source: Ambulatory Visit | Attending: Critical Care Medicine | Admitting: Critical Care Medicine

## 2014-01-20 DIAGNOSIS — R0609 Other forms of dyspnea: Secondary | ICD-10-CM

## 2014-01-20 DIAGNOSIS — R0600 Dyspnea, unspecified: Secondary | ICD-10-CM

## 2014-01-20 DIAGNOSIS — R0989 Other specified symptoms and signs involving the circulatory and respiratory systems: Secondary | ICD-10-CM

## 2014-01-20 DIAGNOSIS — R05 Cough: Secondary | ICD-10-CM

## 2014-01-20 DIAGNOSIS — R059 Cough, unspecified: Secondary | ICD-10-CM

## 2014-01-20 NOTE — Progress Notes (Signed)
Quick Note:  Notify the patient that the Xray is stable and no pneumonia No change in medications are recommended. Continue current meds as prescribed at last office visit ______ 

## 2014-01-21 ENCOUNTER — Encounter: Payer: Self-pay | Admitting: Critical Care Medicine

## 2014-01-21 NOTE — Progress Notes (Signed)
Quick Note:  [email or web address] on pt's home and cell #s . Will also send results through [place]. ______

## 2014-01-22 NOTE — Progress Notes (Signed)
Quick Note:  Pt aware of cxr results and recs per pt email from 01/21/14. ______

## 2014-01-29 ENCOUNTER — Telehealth: Payer: Self-pay | Admitting: Internal Medicine

## 2014-01-29 ENCOUNTER — Encounter: Payer: Self-pay | Admitting: Internal Medicine

## 2014-01-29 ENCOUNTER — Ambulatory Visit (INDEPENDENT_AMBULATORY_CARE_PROVIDER_SITE_OTHER): Payer: Managed Care, Other (non HMO) | Admitting: Internal Medicine

## 2014-01-29 VITALS — BP 138/84 | HR 72 | Temp 98.7°F | Resp 16 | Ht 72.0 in | Wt 218.0 lb

## 2014-01-29 DIAGNOSIS — E039 Hypothyroidism, unspecified: Secondary | ICD-10-CM

## 2014-01-29 DIAGNOSIS — I10 Essential (primary) hypertension: Secondary | ICD-10-CM

## 2014-01-29 DIAGNOSIS — M545 Low back pain, unspecified: Secondary | ICD-10-CM | POA: Insufficient documentation

## 2014-01-29 DIAGNOSIS — I2510 Atherosclerotic heart disease of native coronary artery without angina pectoris: Secondary | ICD-10-CM

## 2014-01-29 DIAGNOSIS — Z0000 Encounter for general adult medical examination without abnormal findings: Secondary | ICD-10-CM

## 2014-01-29 NOTE — Progress Notes (Signed)
Pre visit review using our clinic review tool, if applicable. No additional management support is needed unless otherwise documented below in the visit note. 

## 2014-01-29 NOTE — Assessment & Plan Note (Signed)
The patient is here for annual Medicare wellness examination and management of other chronic and acute problems.   The risk factors are reflected in the social history.  The roster of all physicians providing medical care to patient - is listed in the Snapshot section of the chart.  Activities of daily living:  The patient is 100% inedpendent in all ADLs: dressing, toileting, feeding as well as independent mobility  Home safety : good. Using seatbelts. There is no violence in the home.   There is no risks for hepatitis, STDs or HIV. There is no history of blood transfusion. They have no travel history to infectious disease endemic areas of the world.  The patient has  seen their dentist in the last 12 month. They have  seen their eye doctor in the last year. They deny  Any major hearing difficulty and have not had audiologic testing in the last year.  They do not  have excessive sun exposure. Discussed the need for sun protection: hats, long sleeves and use of sunscreen if there is significant sun exposure.   Diet: the importance of a healthy diet is discussed. They do have a reasonably healthy  diet.  The patient has a fairly regular exercise program of a mixed nature: walking, yard work, etc.The benefits of regular aerobic exercise were discussed.  Depression screen: there are no signs or vegative symptoms of depression- irritability, change in appetite, anhedonia, sadness/tearfullness.  Cognitive assessment: the patient manages all their financial and personal affairs and is actively engaged. They could relate day,date,year and events; recalled 3/3 objects at 3 minutes  The following portions of the patient's history were reviewed and updated as appropriate: allergies, current medications, past family history, past medical history,  past surgical history, past social history  and problem list.  Vision, hearing, body mass index were assessed and reviewed.   During the course of the visit  the patient was educated and counseled about appropriate screening and preventive services including : fall prevention , diabetes screening, nutrition counseling, colorectal cancer screening, and recommended immunizations.

## 2014-01-29 NOTE — Progress Notes (Signed)
   Subjective:    HPI  The patient is here for a wellness exam. The patient has been doing well overall without major physical or psychological issues going on lately, except for L shoulder pain for several months - he had 3 injections. Ortho appt today - Dr Alfonso Ramus.  The patient presents for a follow-up of  chronic CAD, hypertension, chronic dyslipidemia, hypogonadism controlled with medicines. F/u elev K. C/o wheezing off and on x 2 mo. C/o skin lesions     Review of Systems  Constitutional: Negative for appetite change, fatigue and unexpected weight change.  HENT: Negative for congestion, nosebleeds, sneezing, sore throat and trouble swallowing.   Eyes: Negative for itching and visual disturbance.  Respiratory: Negative for cough.   Cardiovascular: Negative for chest pain, palpitations and leg swelling.  Gastrointestinal: Positive for diarrhea. Negative for nausea, blood in stool and abdominal distention.  Genitourinary: Negative for frequency and hematuria.  Musculoskeletal: Negative for back pain, gait problem, joint swelling and neck pain.  Skin: Negative for rash.  Neurological: Negative for dizziness, tremors, speech difficulty and weakness.  Psychiatric/Behavioral: Negative for sleep disturbance, dysphoric mood and agitation. The patient is nervous/anxious.        Objective:   Physical Exam  Constitutional: He is oriented to person, place, and time. He appears well-developed.  HENT:  Mouth/Throat: Oropharynx is clear and moist.  Eyes: Conjunctivae are normal. Pupils are equal, round, and reactive to light.  Neck: Normal range of motion. No JVD present. No thyromegaly present.  Cardiovascular: Normal rate, regular rhythm, normal heart sounds and intact distal pulses.  Exam reveals no gallop and no friction rub.   No murmur heard. Pulmonary/Chest: Effort normal and breath sounds normal. No respiratory distress. He has no wheezes. He has no rales. He exhibits no tenderness.   Abdominal: Soft. Bowel sounds are normal. He exhibits no distension and no mass. There is no tenderness. There is no rebound and no guarding.  Musculoskeletal: Normal range of motion. He exhibits no edema and no tenderness.  Lymphadenopathy:    He has no cervical adenopathy.  Neurological: He is alert and oriented to person, place, and time. He has normal reflexes. No cranial nerve deficit. He exhibits normal muscle tone. Coordination normal.  Skin: Skin is warm and dry. No rash noted.  Psychiatric: His behavior is normal. Judgment and thought content normal.  warts x3 L shoulder is tender w/ROM   Lab Results  Component Value Date   WBC 7.9 12/12/2012   HGB 17.0 12/12/2012   HCT 52.3* 12/12/2012   PLT 107.0 Repeated and verified X2.* 12/12/2012   CHOL 165 09/10/2012   TRIG 188.0* 09/10/2012   HDL 32.60* 09/10/2012   ALT 28 12/12/2012   AST 23 12/12/2012   NA 138 06/13/2013   K 4.8 06/13/2013   CL 103 06/13/2013   CREATININE 1.9* 06/13/2013   BUN 15 06/13/2013   CO2 30 06/13/2013   TSH 4.29 12/12/2012   PSA 3.72 12/19/2012   HGBA1C 5.4 05/05/2009           Assessment & Plan:

## 2014-01-29 NOTE — Assessment & Plan Note (Signed)
Start stretching

## 2014-01-29 NOTE — Telephone Encounter (Signed)
Relevant patient education assigned to patient using Emmi. ° °

## 2014-01-29 NOTE — Assessment & Plan Note (Signed)
Continue with current prescription therapy as reflected on the Med list.  

## 2014-01-30 ENCOUNTER — Ambulatory Visit (INDEPENDENT_AMBULATORY_CARE_PROVIDER_SITE_OTHER): Payer: Managed Care, Other (non HMO) | Admitting: *Deleted

## 2014-01-30 DIAGNOSIS — M25519 Pain in unspecified shoulder: Secondary | ICD-10-CM

## 2014-01-30 DIAGNOSIS — I10 Essential (primary) hypertension: Secondary | ICD-10-CM

## 2014-01-30 DIAGNOSIS — M2550 Pain in unspecified joint: Secondary | ICD-10-CM

## 2014-01-30 DIAGNOSIS — Z0000 Encounter for general adult medical examination without abnormal findings: Secondary | ICD-10-CM

## 2014-01-30 DIAGNOSIS — J45909 Unspecified asthma, uncomplicated: Secondary | ICD-10-CM

## 2014-01-30 DIAGNOSIS — N259 Disorder resulting from impaired renal tubular function, unspecified: Secondary | ICD-10-CM

## 2014-01-30 DIAGNOSIS — I2510 Atherosclerotic heart disease of native coronary artery without angina pectoris: Secondary | ICD-10-CM

## 2014-01-30 DIAGNOSIS — R972 Elevated prostate specific antigen [PSA]: Secondary | ICD-10-CM

## 2014-01-30 DIAGNOSIS — M25512 Pain in left shoulder: Secondary | ICD-10-CM

## 2014-01-30 DIAGNOSIS — L570 Actinic keratosis: Secondary | ICD-10-CM

## 2014-01-30 LAB — CBC WITH DIFFERENTIAL/PLATELET
Basophils Absolute: 0 10*3/uL (ref 0.0–0.1)
Basophils Relative: 0.6 % (ref 0.0–3.0)
Eosinophils Absolute: 0.3 10*3/uL (ref 0.0–0.7)
Eosinophils Relative: 5.1 % — ABNORMAL HIGH (ref 0.0–5.0)
HCT: 50.2 % (ref 39.0–52.0)
Hemoglobin: 16.5 g/dL (ref 13.0–17.0)
LYMPHS PCT: 20.4 % (ref 12.0–46.0)
Lymphs Abs: 1.1 10*3/uL (ref 0.7–4.0)
MCHC: 32.8 g/dL (ref 30.0–36.0)
MCV: 93.8 fl (ref 78.0–100.0)
MONOS PCT: 10.3 % (ref 3.0–12.0)
Monocytes Absolute: 0.5 10*3/uL (ref 0.1–1.0)
Neutro Abs: 3.4 10*3/uL (ref 1.4–7.7)
Neutrophils Relative %: 63.6 % (ref 43.0–77.0)
PLATELETS: 112 10*3/uL — AB (ref 150.0–400.0)
RBC: 5.36 Mil/uL (ref 4.22–5.81)
RDW: 15.6 % — AB (ref 11.5–14.6)
WBC: 5.3 10*3/uL (ref 4.5–10.5)

## 2014-01-30 LAB — URINALYSIS
BILIRUBIN URINE: NEGATIVE
Hgb urine dipstick: NEGATIVE
Ketones, ur: NEGATIVE
LEUKOCYTES UA: NEGATIVE
NITRITE: NEGATIVE
PH: 7 (ref 5.0–8.0)
SPECIFIC GRAVITY, URINE: 1.02 (ref 1.000–1.030)
Urine Glucose: NEGATIVE
Urobilinogen, UA: 0.2 (ref 0.0–1.0)

## 2014-01-30 LAB — HEPATIC FUNCTION PANEL
ALK PHOS: 48 U/L (ref 39–117)
ALT: 23 U/L (ref 0–53)
AST: 22 U/L (ref 0–37)
Albumin: 3.6 g/dL (ref 3.5–5.2)
BILIRUBIN DIRECT: 0.2 mg/dL (ref 0.0–0.3)
BILIRUBIN TOTAL: 1.1 mg/dL (ref 0.3–1.2)
Total Protein: 5.8 g/dL — ABNORMAL LOW (ref 6.0–8.3)

## 2014-01-30 LAB — BASIC METABOLIC PANEL
BUN: 12 mg/dL (ref 6–23)
CHLORIDE: 102 meq/L (ref 96–112)
CO2: 30 mEq/L (ref 19–32)
Calcium: 8.9 mg/dL (ref 8.4–10.5)
Creatinine, Ser: 2 mg/dL — ABNORMAL HIGH (ref 0.4–1.5)
GFR: 35.66 mL/min — ABNORMAL LOW (ref >60.00)
Glucose, Bld: 90 mg/dL (ref 70–99)
POTASSIUM: 5 meq/L (ref 3.5–5.1)
SODIUM: 140 meq/L (ref 135–145)

## 2014-01-30 LAB — LIPID PANEL
Cholesterol: 146 mg/dL (ref 0–200)
HDL: 40.8 mg/dL (ref >39.00)
LDL Cholesterol: 89 mg/dL (ref 0–99)
TRIGLYCERIDES: 83 mg/dL (ref 0.0–149.0)
Total CHOL/HDL Ratio: 4
VLDL: 16.6 mg/dL (ref 0.0–40.0)

## 2014-01-30 LAB — PSA: PSA: 3.3 ng/mL (ref 0.10–4.00)

## 2014-01-30 LAB — TSH: TSH: 5.78 u[IU]/mL — AB (ref 0.35–5.50)

## 2014-02-04 MED ORDER — LEVOTHYROXINE SODIUM 125 MCG PO TABS: 125.0000 ug | ORAL_TABLET | Freq: Every day | ORAL | Status: DC

## 2014-02-18 ENCOUNTER — Telehealth: Payer: Self-pay | Admitting: *Deleted

## 2014-02-18 NOTE — Telephone Encounter (Signed)
Testosterone 200 mg/ml PA is approved.

## 2014-02-18 NOTE — Telephone Encounter (Signed)
Completed PA form for Testosteron Cyp 200 mg/ml faxed to pt's Ins plan.

## 2014-02-18 NOTE — Telephone Encounter (Signed)
Left detailed mess informing pt of below.  

## 2014-03-05 ENCOUNTER — Other Ambulatory Visit: Payer: Managed Care, Other (non HMO)

## 2014-03-05 ENCOUNTER — Encounter: Payer: Self-pay | Admitting: Critical Care Medicine

## 2014-03-05 ENCOUNTER — Ambulatory Visit (INDEPENDENT_AMBULATORY_CARE_PROVIDER_SITE_OTHER): Payer: Managed Care, Other (non HMO) | Admitting: Critical Care Medicine

## 2014-03-05 VITALS — BP 146/80 | HR 64 | Temp 98.5°F | Ht 72.0 in | Wt 218.6 lb

## 2014-03-05 DIAGNOSIS — J45902 Unspecified asthma with status asthmaticus: Secondary | ICD-10-CM

## 2014-03-05 DIAGNOSIS — J4540 Moderate persistent asthma, uncomplicated: Secondary | ICD-10-CM

## 2014-03-05 DIAGNOSIS — J45909 Unspecified asthma, uncomplicated: Secondary | ICD-10-CM

## 2014-03-05 MED ORDER — BUDESONIDE-FORMOTEROL FUMARATE 160-4.5 MCG/ACT IN AERO: 2.0000 | INHALATION_SPRAY | Freq: Two times a day (BID) | RESPIRATORY_TRACT | Status: DC

## 2014-03-05 NOTE — Progress Notes (Signed)
Subjective:    Patient ID: Scott Oneal, male    DOB: [birth year], 73 y.o.   MRN: [id number]  HPI Comments: Dyspnea for 72month, assoc wheezing while working in yard.   Dyspnea only with exeriton No real cough.  Life long never smoker, some passive smoke exposure with father   03/05/2014 Chief Complaint  Patient presents with  . 2 month follow up    DOE is unchanged.  Wheezing with exertion is unchanged.  No chest tightness/pain or cough.  Pt using breo everyday, but a side effect, if breath deep wants to cough. Pt still wheezing.  Cough is dry.  Pt had a cough and got better on pred, also chemical exposure may have been a trigger. No GERD. No postnasal drip.   Review of Systems  Constitutional: Negative for chills, diaphoresis, activity change, appetite change, fatigue and unexpected weight change.  HENT: Positive for sneezing. Negative for congestion, dental problem, ear discharge, facial swelling, hearing loss, mouth sores, nosebleeds, postnasal drip, sinus pressure, tinnitus, trouble swallowing and voice change.   Eyes: Negative for photophobia, discharge, itching and visual disturbance.  Respiratory: Negative for apnea, cough, choking, chest tightness and stridor.   Cardiovascular: Negative for palpitations.  Gastrointestinal: Negative for nausea, constipation, blood in stool and abdominal distention.  Genitourinary: Negative for dysuria, urgency, frequency, hematuria, flank pain, decreased urine volume and difficulty urinating.  Musculoskeletal: Negative for arthralgias, back pain, gait problem, joint swelling, myalgias and neck stiffness.  Skin: Negative for color change and pallor.  Neurological: Negative for dizziness, tremors, seizures, syncope, speech difficulty, weakness, light-headedness and numbness.  Hematological: Negative for adenopathy. Does not bruise/bleed easily.  Psychiatric/Behavioral: Positive for sleep disturbance. Negative for confusion and agitation. The patient  is nervous/anxious.        Objective:   Physical Exam  Filed Vitals:   03/05/14 0858  BP: 146/80  Pulse: 64  Temp: 98.5 F (36.9 C)  TempSrc: Oral  Height: 6' (1.829 m)  Weight: 218 lb 9.6 oz (99.156 kg)  SpO2: 96%    Gen: Pleasant, well-nourished, in no distress,  normal affect  ENT: No lesions,  mouth clear,  oropharynx clear, no postnasal drip  Neck: No JVD, no TMG, no carotid bruits  Lungs: No use of accessory muscles, no dullness to percussion, distant breath sounds  Cardiovascular: RRR, heart sounds normal, no murmur or gallops, no peripheral edema  Abdomen: soft and NT, no HSM,  BS normal  Musculoskeletal: No deformities, no cyanosis or clubbing  Neuro: alert, non focal  Skin: Warm, no lesions or rashes  No results found.  allergy profile negative     Assessment & Plan:   Moderate persistent asthma without complication Moderate persistent asthma with poor response to once daily dilator and inhaled steroid Note allergy profile negative for atopic features Plan Discontinue breo Initiate Symbicort 2 puffs twice daily    Updated Medication List Outpatient Encounter Prescriptions as of 03/05/2014  Medication Sig  . aspirin 81 MG tablet Take 81 mg by mouth daily.    . calcitRIOL (ROCALTROL) 0.25 MCG capsule Take 1 capsule (0.25 mcg total) by mouth daily.  . carvedilol (COREG) 25 MG tablet Take 1 tablet (25 mg total) by mouth 2 (two) times daily with a meal.  . clonazePAM (KLONOPIN) 0.25 MG disintegrating tablet Take 1 tablet (0.25 mg total) by mouth 2 (two) times daily as needed (anxiety).  . clopidogrel (PLAVIX) 75 MG tablet Take 1 tablet (75 mg total) by mouth daily.  . fish  oil-omega-3 fatty acids 1000 MG capsule Take 1 g by mouth daily.    Marland Kitchen levothyroxine (SYNTHROID, LEVOTHROID) 125 MCG tablet Take 1 tablet (125 mcg total) by mouth daily before breakfast.  . Multiple Vitamins-Minerals (MULTIVITAMIN,TX-MINERALS) tablet Take 1 tablet by mouth daily.     . nitroGLYCERIN (NITROSTAT) 0.4 MG SL tablet Place 0.4 mg under the tongue every 5 (five) minutes as needed.    Marland Kitchen olmesartan (BENICAR) 20 MG tablet Take 10 mg by mouth daily.  . sildenafil (VIAGRA) 100 MG tablet Take 100 mg by mouth daily as needed.    . simvastatin (ZOCOR) 10 MG tablet Take 1 tablet (10 mg total) by mouth at bedtime.  . SYRINGE-NEEDLE, DISP, 3 ML (B-D 3CC LUER-LOK SYR 23GX1") 23G X 1" 3 ML MISC Use as directed  by prescriber  . testosterone cypionate (DEPOTESTOTERONE CYPIONATE) 200 MG/ML injection INJECT 2ML EVERY 2 WEEKS  . [DISCONTINUED] Fluticasone Furoate-Vilanterol 100-25 MCG/INH AEPB Inhale 1 puff into the lungs daily.  . budesonide-formoterol (SYMBICORT) 160-4.5 MCG/ACT inhaler Inhale 2 puffs into the lungs 2 (two) times daily.  . [DISCONTINUED] predniSONE (DELTASONE) 10 MG tablet Prednisone 10 mg: take 4 tabs a day x 3 days; then 3 tabs a day x 4 days; then 2 tabs a day x 4 days, then 1 tab a day x 6 days, then stop. Take pc.

## 2014-03-05 NOTE — Patient Instructions (Signed)
Stop [employer] Symbicort two puff bid Allergy labs Return 2 months

## 2014-03-06 LAB — ALLERGY FULL PROFILE
Allergen, D pternoyssinus,d7: <0.1 kU/L
Bahia Grass: <0.1 kU/L
Box Elder IgE: <0.1 kU/L
Common Ragweed: <0.1 kU/L
Elm IgE: <0.1 kU/L
G005 Rye, Perennial: <0.1 kU/L
G009 Red Top: <0.1 kU/L
House Dust Hollister: <0.1 kU/L
IgE (Immunoglobulin E), Serum: 2.1 IU/mL (ref 0.0–180.0)
Lamb's Quarters: <0.1 kU/L
Oak: <0.1 kU/L
Sycamore Tree: <0.1 kU/L
Timothy Grass: <0.1 kU/L

## 2014-03-06 NOTE — Progress Notes (Signed)
Quick Note:  LMTCB ______ 

## 2014-03-06 NOTE — Assessment & Plan Note (Signed)
Moderate persistent asthma with poor response to once daily dilator and inhaled steroid Note allergy profile negative for atopic features Plan Discontinue breo Initiate Symbicort 2 puffs twice daily

## 2014-03-06 NOTE — Progress Notes (Signed)
Quick Note:  Call pt and tell him his labs are normal he does not have significant allergies ______

## 2014-03-07 NOTE — Progress Notes (Signed)
Quick Note:  Called pt's home # - [email or web address] Called pt's cell # - went directly to VM - [email or web address] Will also send results through MyChart ______

## 2014-03-10 NOTE — Progress Notes (Signed)
Quick Note:  Called, spoke with pt. Informed him of lab results per Dr. Joya Gaskins. He verbalized understanding and voiced no further questions or concerns at this time. ______

## 2014-04-15 ENCOUNTER — Other Ambulatory Visit: Payer: Self-pay | Admitting: *Deleted

## 2014-04-15 ENCOUNTER — Encounter: Payer: Self-pay | Admitting: Internal Medicine

## 2014-04-15 ENCOUNTER — Encounter: Payer: Self-pay | Admitting: Critical Care Medicine

## 2014-04-15 DIAGNOSIS — J45902 Unspecified asthma with status asthmaticus: Secondary | ICD-10-CM

## 2014-04-15 MED ORDER — BUDESONIDE-FORMOTEROL FUMARATE 160-4.5 MCG/ACT IN AERO: 2.0000 | INHALATION_SPRAY | Freq: Two times a day (BID) | RESPIRATORY_TRACT | Status: DC

## 2014-04-15 MED ORDER — SIMVASTATIN 10 MG PO TABS: 10.0000 mg | ORAL_TABLET | Freq: Every day | ORAL | Status: DC

## 2014-04-15 MED ORDER — CALCITRIOL 0.25 MCG PO CAPS: 0.2500 ug | ORAL_CAPSULE | Freq: Every day | ORAL | Status: DC

## 2014-04-17 ENCOUNTER — Other Ambulatory Visit: Payer: Self-pay | Admitting: *Deleted

## 2014-04-17 MED ORDER — CLOPIDOGREL BISULFATE 75 MG PO TABS: 75.0000 mg | ORAL_TABLET | Freq: Every day | ORAL | Status: DC

## 2014-04-21 ENCOUNTER — Other Ambulatory Visit: Payer: Self-pay | Admitting: Internal Medicine

## 2014-04-30 ENCOUNTER — Ambulatory Visit (INDEPENDENT_AMBULATORY_CARE_PROVIDER_SITE_OTHER): Payer: Managed Care, Other (non HMO) | Admitting: Internal Medicine

## 2014-04-30 ENCOUNTER — Encounter: Payer: Self-pay | Admitting: Internal Medicine

## 2014-04-30 VITALS — BP 140/84 | HR 80 | Temp 98.4°F | Resp 16 | Wt 220.0 lb

## 2014-04-30 DIAGNOSIS — L270 Generalized skin eruption due to drugs and medicaments taken internally: Secondary | ICD-10-CM | POA: Insufficient documentation

## 2014-04-30 DIAGNOSIS — R21 Rash and other nonspecific skin eruption: Secondary | ICD-10-CM | POA: Insufficient documentation

## 2014-04-30 DIAGNOSIS — R634 Abnormal weight loss: Secondary | ICD-10-CM

## 2014-04-30 DIAGNOSIS — M545 Low back pain, unspecified: Secondary | ICD-10-CM

## 2014-04-30 DIAGNOSIS — F411 Generalized anxiety disorder: Secondary | ICD-10-CM

## 2014-04-30 DIAGNOSIS — N259 Disorder resulting from impaired renal tubular function, unspecified: Secondary | ICD-10-CM

## 2014-04-30 DIAGNOSIS — I2510 Atherosclerotic heart disease of native coronary artery without angina pectoris: Secondary | ICD-10-CM

## 2014-04-30 DIAGNOSIS — E039 Hypothyroidism, unspecified: Secondary | ICD-10-CM

## 2014-04-30 DIAGNOSIS — I10 Essential (primary) hypertension: Secondary | ICD-10-CM

## 2014-04-30 DIAGNOSIS — E291 Testicular hypofunction: Secondary | ICD-10-CM

## 2014-04-30 DIAGNOSIS — K900 Celiac disease: Secondary | ICD-10-CM

## 2014-04-30 DIAGNOSIS — R972 Elevated prostate specific antigen [PSA]: Secondary | ICD-10-CM

## 2014-04-30 MED ORDER — TESTOSTERONE CYPIONATE 200 MG/ML IM SOLN: 100.0000 mg | INTRAMUSCULAR | Status: DC

## 2014-04-30 MED ORDER — TRIAMCINOLONE ACETONIDE 0.1 % EX CREA: 1.0000 "application " | TOPICAL_CREAM | Freq: Two times a day (BID) | CUTANEOUS | Status: DC

## 2014-04-30 NOTE — Assessment & Plan Note (Signed)
Continue with current prescription therapy as reflected on the Med list.  

## 2014-04-30 NOTE — Assessment & Plan Note (Signed)
Chronic  S/p Urol evaluation Dr Diona Fanti: pt was told ok to cont testosterone and split it to weekly

## 2014-04-30 NOTE — Progress Notes (Signed)
Pre visit review using our clinic review tool, if applicable. No additional management support is needed unless otherwise documented below in the visit note. 

## 2014-04-30 NOTE — Assessment & Plan Note (Signed)
Wt Readings from Last 3 Encounters:  04/30/14 220 lb (99.791 kg)  03/05/14 218 lb 9.6 oz (99.156 kg)  01/29/14 218 lb (98.884 kg)

## 2014-04-30 NOTE — Assessment & Plan Note (Signed)
7/15 L arm - ?insect bites Triamc cream

## 2014-04-30 NOTE — Assessment & Plan Note (Signed)
Cont w/gluten free diet

## 2014-04-30 NOTE — Progress Notes (Signed)
   Subjective:    HPI    The patient presents for a follow-up of  chronic CAD, hypertension, chronic dyslipidemia, hypogonadism controlled with medicines. F/u elev K, elev PSA.   C/o wheezing off and on x 2 mo. C/o skin lesions - L arm     Review of Systems  Constitutional: Negative for appetite change, fatigue and unexpected weight change.  HENT: Negative for congestion, nosebleeds, sneezing, sore throat and trouble swallowing.   Eyes: Negative for itching and visual disturbance.  Respiratory: Negative for cough.   Cardiovascular: Negative for chest pain, palpitations and leg swelling.  Gastrointestinal: Positive for diarrhea. Negative for nausea, blood in stool and abdominal distention.  Genitourinary: Negative for frequency and hematuria.  Musculoskeletal: Negative for back pain, gait problem, joint swelling and neck pain.  Skin: Negative for rash.  Neurological: Negative for dizziness, tremors, speech difficulty and weakness.  Psychiatric/Behavioral: Negative for sleep disturbance, dysphoric mood and agitation. The patient is nervous/anxious.        Objective:   Physical Exam  Constitutional: He is oriented to person, place, and time. He appears well-developed.  HENT:  Mouth/Throat: Oropharynx is clear and moist.  Eyes: Conjunctivae are normal. Pupils are equal, round, and reactive to light.  Neck: Normal range of motion. No JVD present. No thyromegaly present.  Cardiovascular: Normal rate, regular rhythm, normal heart sounds and intact distal pulses.  Exam reveals no gallop and no friction rub.   No murmur heard. Pulmonary/Chest: Effort normal and breath sounds normal. No respiratory distress. He has no wheezes. He has no rales. He exhibits no tenderness.  Abdominal: Soft. Bowel sounds are normal. He exhibits no distension and no mass. There is no tenderness. There is no rebound and no guarding.  Musculoskeletal: Normal range of motion. He exhibits no edema and no  tenderness.  Lymphadenopathy:    He has no cervical adenopathy.  Neurological: He is alert and oriented to person, place, and time. He has normal reflexes. No cranial nerve deficit. He exhibits normal muscle tone. Coordination normal.  Skin: Skin is warm and dry. No rash noted.  Psychiatric: His behavior is normal. Judgment and thought content normal.  warts x3 L shoulder - cluster of eryth papules   Lab Results  Component Value Date   WBC 5.3 01/30/2014   HGB 16.5 01/30/2014   HCT 50.2 01/30/2014   PLT 112.0* 01/30/2014   CHOL 146 01/30/2014   TRIG 83.0 01/30/2014   HDL 40.80 01/30/2014   ALT 23 01/30/2014   AST 22 01/30/2014   NA 140 01/30/2014   K 5.0 01/30/2014   CL 102 01/30/2014   CREATININE 2.0* 01/30/2014   BUN 12 01/30/2014   CO2 30 01/30/2014   TSH 5.78* 01/30/2014   PSA 3.30 01/30/2014   HGBA1C 5.4 05/05/2009           Assessment & Plan:

## 2014-04-30 NOTE — Assessment & Plan Note (Signed)
Labs

## 2014-04-30 NOTE — Assessment & Plan Note (Signed)
6/15:  Dr Diona Fanti: pt was told ok to cont testosterone and split it to weekly

## 2014-05-14 ENCOUNTER — Ambulatory Visit (INDEPENDENT_AMBULATORY_CARE_PROVIDER_SITE_OTHER): Payer: Managed Care, Other (non HMO) | Admitting: Critical Care Medicine

## 2014-05-14 ENCOUNTER — Encounter: Payer: Self-pay | Admitting: Critical Care Medicine

## 2014-05-14 VITALS — BP 154/86 | HR 73 | Ht 72.0 in | Wt 221.8 lb

## 2014-05-14 DIAGNOSIS — B370 Candidal stomatitis: Secondary | ICD-10-CM

## 2014-05-14 DIAGNOSIS — J45902 Unspecified asthma with status asthmaticus: Secondary | ICD-10-CM

## 2014-05-14 DIAGNOSIS — J45909 Unspecified asthma, uncomplicated: Secondary | ICD-10-CM

## 2014-05-14 DIAGNOSIS — J4540 Moderate persistent asthma, uncomplicated: Secondary | ICD-10-CM

## 2014-05-14 MED ORDER — BUDESONIDE-FORMOTEROL FUMARATE 160-4.5 MCG/ACT IN AERO: 2.0000 | INHALATION_SPRAY | Freq: Two times a day (BID) | RESPIRATORY_TRACT | Status: DC

## 2014-05-14 MED ORDER — SIMVASTATIN 10 MG PO TABS: ORAL_TABLET | ORAL | Status: DC

## 2014-05-14 MED ORDER — AEROCHAMBER PLUS FLO-VU LARGE MISC: Status: DC

## 2014-05-14 MED ORDER — FLUCONAZOLE 100 MG PO TABS: ORAL_TABLET | ORAL | Status: DC

## 2014-05-14 MED ORDER — AEROCHAMBER MV MISC: Status: DC

## 2014-05-14 MED ORDER — ALBUTEROL SULFATE HFA 108 (90 BASE) MCG/ACT IN AERS: 2.0000 | INHALATION_SPRAY | Freq: Four times a day (QID) | RESPIRATORY_TRACT | Status: DC | PRN

## 2014-05-14 NOTE — Patient Instructions (Signed)
Diflucan 175m Take two once then one daily until gone Use space on symbicort Stay on symbicort Albuterol as needed, can use before heavy exertion Return 4 months

## 2014-05-14 NOTE — Progress Notes (Signed)
Subjective:    Patient ID: Scott Oneal, male    DOB: [birth year], 73 y.o.   MRN: [id number]  HPI Comments: Dyspnea for 65month, assoc wheezing while working in yard.   Dyspnea only with exeriton No real cough.  Life long never smoker, some passive smoke exposure with father  05/14/2014 Chief Complaint  Patient presents with  . 2 month follow up    DOE has improved but still has wheezing with activity.  Has noticed raspy voice recently.  Chest tightness at times.  No cough.     On symbicort and off breo.  Still wheeze with a lot of activity. No cough. DOE is better.  occ headaches on symbicort.  Pt had candidiasis but better with a rinse.  Pt notes hoarseness.    PUL ASTHMA HISTORY 05/14/2014  Symptoms 0-2 days/week  Nighttime awakenings 0-2/month  Interference with activity No limitations  SABA use 0-2 days/wk  Exacerbations requiring oral steroids 0-1 / year    Review of Systems  Constitutional: Negative for chills, diaphoresis, activity change, appetite change, fatigue and unexpected weight change.  HENT: Negative for congestion, dental problem, ear discharge, facial swelling, hearing loss, mouth sores, nosebleeds, postnasal drip, sinus pressure, sneezing, tinnitus, trouble swallowing and voice change.   Eyes: Negative for photophobia, discharge, itching and visual disturbance.  Respiratory: Negative for apnea, cough, choking, chest tightness and stridor.   Cardiovascular: Negative for palpitations.  Gastrointestinal: Negative for nausea, constipation, blood in stool and abdominal distention.       No heartburn  Genitourinary: Negative for dysuria, urgency, frequency, hematuria, flank pain, decreased urine volume and difficulty urinating.  Musculoskeletal: Negative for arthralgias, back pain, gait problem, joint swelling, myalgias and neck stiffness.  Skin: Negative for color change and pallor.  Neurological: Negative for dizziness, tremors, seizures, syncope, speech  difficulty, weakness, light-headedness and numbness.  Hematological: Negative for adenopathy. Does not bruise/bleed easily.  Psychiatric/Behavioral: Positive for sleep disturbance. Negative for confusion and agitation. The patient is nervous/anxious.        Objective:   Physical Exam  Filed Vitals:   05/14/14 0912  BP: 154/86  Pulse: 73  Height: 6' (1.829 m)  Weight: 221 lb 12.8 oz (100.608 kg)  SpO2: 96%    Gen: Pleasant, well-nourished, in no distress,  normal affect  ENT: No lesions,  mouth clear,  Oropharynx +++ oral candidiasis  no postnasal drip  Neck: No JVD, no TMG, no carotid bruits  Lungs: No use of accessory muscles, no dullness to percussion, distant breath sounds  Cardiovascular: RRR, heart sounds normal, no murmur or gallops, no peripheral edema  Abdomen: soft and NT, no HSM,  BS normal  Musculoskeletal: No deformities, no cyanosis or clubbing  Neuro: alert, non focal  Skin: Warm, no lesions or rashes  No results found.  allergy profile negative     Assessment & Plan:   Moderate persistent asthma without complication Moderate persistent asthma with associated oral candidiasis with ics use Plan Diflucan 1046mTake two once then one daily until gone Use space on symbicort Stay on symbicort Albuterol as needed, can use before heavy exertion Return 4 months   Oral candidiasis Due to ICS use  Plan  Diflucan Spacer for symbicort    Updated Medication List Outpatient Encounter Prescriptions as of 05/14/2014  Medication Sig  . aspirin 81 MG tablet Take 81 mg by mouth daily.    . budesonide-formoterol (SYMBICORT) 160-4.5 MCG/ACT inhaler Inhale 2 puffs into the lungs 2 (two) times daily.  .Marland Kitchen  calcitRIOL (ROCALTROL) 0.25 MCG capsule Take 1 capsule (0.25 mcg total) by mouth daily.  . carvedilol (COREG) 25 MG tablet Take 1 tablet (25 mg total) by mouth 2 (two) times daily with a meal.  . clonazePAM (KLONOPIN) 0.25 MG disintegrating tablet Take 1 tablet  (0.25 mg total) by mouth 2 (two) times daily as needed (anxiety).  . clopidogrel (PLAVIX) 75 MG tablet Take 1 tablet (75 mg total) by mouth daily.  . fish oil-omega-3 fatty acids 1000 MG capsule Take 1 g by mouth daily.    Marland Kitchen levothyroxine (SYNTHROID, LEVOTHROID) 125 MCG tablet Take 1 tablet (125 mcg total) by mouth daily before breakfast.  . Multiple Vitamins-Minerals (MULTIVITAMIN,TX-MINERALS) tablet Take 1 tablet by mouth daily.    . nitroGLYCERIN (NITROSTAT) 0.4 MG SL tablet Place 0.4 mg under the tongue every 5 (five) minutes as needed.    Marland Kitchen olmesartan (BENICAR) 20 MG tablet Take 10 mg by mouth daily.  . sildenafil (VIAGRA) 100 MG tablet Take 100 mg by mouth daily as needed.    . simvastatin (ZOCOR) 10 MG tablet Hold while on diflucan then resume  . SYRINGE-NEEDLE, DISP, 3 ML (B-D 3CC LUER-LOK SYR 23GX1") 23G X 1" 3 ML MISC Use as directed  by prescriber  . testosterone cypionate (DEPOTESTOTERONE CYPIONATE) 200 MG/ML injection Inject 0.5 mLs (100 mg total) into the muscle once a week.  . triamcinolone cream (KENALOG) 0.1 % Apply 1 application topically 2 (two) times daily as needed.  . [DISCONTINUED] budesonide-formoterol (SYMBICORT) 160-4.5 MCG/ACT inhaler Inhale 2 puffs into the lungs 2 (two) times daily.  . [DISCONTINUED] simvastatin (ZOCOR) 10 MG tablet Take 1 tablet (10 mg total) by mouth at bedtime.  . [DISCONTINUED] triamcinolone cream (KENALOG) 0.1 % Apply 1 application topically 2 (two) times daily.  Marland Kitchen albuterol (PROAIR HFA) 108 (90 BASE) MCG/ACT inhaler Inhale 2 puffs into the lungs every 6 (six) hours as needed for wheezing or shortness of breath.  . fluconazole (DIFLUCAN) 100 MG tablet Take two once then one daily until gone  . Spacer/Aero-Holding Chambers (AEROCHAMBER MV) inhaler Use as instructed  . Spacer/Aero-Holding Chambers (AEROCHAMBER PLUS FLO-VU LARGE) MISC Use as directed

## 2014-05-15 DIAGNOSIS — B370 Candidal stomatitis: Secondary | ICD-10-CM | POA: Insufficient documentation

## 2014-05-15 NOTE — Assessment & Plan Note (Signed)
Due to ICS use  Plan  Diflucan Spacer for symbicort

## 2014-05-15 NOTE — Assessment & Plan Note (Signed)
Moderate persistent asthma with associated oral candidiasis with ics use Plan Diflucan 194m Take two once then one daily until gone Use space on symbicort Stay on symbicort Albuterol as needed, can use before heavy exertion Return 4 months

## 2014-05-18 ENCOUNTER — Encounter: Payer: Self-pay | Admitting: Internal Medicine

## 2014-05-19 ENCOUNTER — Other Ambulatory Visit: Payer: Self-pay | Admitting: Internal Medicine

## 2014-05-19 MED ORDER — PREDNISONE 10 MG PO TABS: ORAL_TABLET | ORAL | Status: DC

## 2014-06-01 ENCOUNTER — Other Ambulatory Visit: Payer: Self-pay | Admitting: Internal Medicine

## 2014-06-10 ENCOUNTER — Other Ambulatory Visit (INDEPENDENT_AMBULATORY_CARE_PROVIDER_SITE_OTHER): Payer: Managed Care, Other (non HMO)

## 2014-06-10 ENCOUNTER — Ambulatory Visit (INDEPENDENT_AMBULATORY_CARE_PROVIDER_SITE_OTHER)
Admission: RE | Admit: 2014-06-10 | Discharge: 2014-06-10 | Disposition: A | Discharge status: Home / Self Care | Payer: Managed Care, Other (non HMO) | Source: Ambulatory Visit | Attending: Internal Medicine | Admitting: Internal Medicine

## 2014-06-10 ENCOUNTER — Ambulatory Visit (INDEPENDENT_AMBULATORY_CARE_PROVIDER_SITE_OTHER): Payer: Managed Care, Other (non HMO) | Admitting: Internal Medicine

## 2014-06-10 ENCOUNTER — Encounter: Payer: Self-pay | Admitting: Internal Medicine

## 2014-06-10 VITALS — BP 154/85 | HR 65 | Temp 98.2°F | Wt 223.0 lb

## 2014-06-10 DIAGNOSIS — M79671 Pain in right foot: Secondary | ICD-10-CM | POA: Insufficient documentation

## 2014-06-10 DIAGNOSIS — F411 Generalized anxiety disorder: Secondary | ICD-10-CM

## 2014-06-10 DIAGNOSIS — K900 Celiac disease: Secondary | ICD-10-CM

## 2014-06-10 DIAGNOSIS — M79609 Pain in unspecified limb: Secondary | ICD-10-CM

## 2014-06-10 LAB — BASIC METABOLIC PANEL
BUN: 14 mg/dL (ref 6–23)
CALCIUM: 9.1 mg/dL (ref 8.4–10.5)
CO2: 30 mEq/L (ref 19–32)
CREATININE: 1.8 mg/dL — AB (ref 0.4–1.5)
Chloride: 105 mEq/L (ref 96–112)
GFR: 40.05 mL/min — AB (ref >60.00)
GLUCOSE: 89 mg/dL (ref 70–99)
Potassium: 4.5 mEq/L (ref 3.5–5.1)
SODIUM: 140 meq/L (ref 135–145)

## 2014-06-10 LAB — SEDIMENTATION RATE: Sed Rate: 6 mm/hr (ref 0–22)

## 2014-06-10 LAB — URIC ACID: Uric Acid, Serum: 6.6 mg/dL (ref 4.0–7.8)

## 2014-06-10 LAB — RHEUMATOID FACTOR: Rhuematoid fact SerPl-aCnc: <10 IU/mL (ref <=14)

## 2014-06-10 MED ORDER — PREDNISONE 10 MG PO TABS: ORAL_TABLET | ORAL | Status: DC

## 2014-06-10 MED ORDER — TRAMADOL HCL 50 MG PO TABS: 50.0000 mg | ORAL_TABLET | Freq: Two times a day (BID) | ORAL | Status: DC | PRN

## 2014-06-10 NOTE — Assessment & Plan Note (Signed)
8/15 acute ?etiology X ray

## 2014-06-10 NOTE — Progress Notes (Signed)
Pre visit review using our clinic review tool, if applicable. No additional management support is needed unless otherwise documented below in the visit note. 

## 2014-06-11 ENCOUNTER — Other Ambulatory Visit: Payer: Self-pay | Admitting: Internal Medicine

## 2014-06-11 ENCOUNTER — Encounter: Payer: Self-pay | Admitting: Internal Medicine

## 2014-06-11 NOTE — Telephone Encounter (Signed)
Message copied by Cresenciano Lick on Wed Jun 11, 2014 11:57 AM ------      Message from: Cassandria Anger      Created: Tue Jun 10, 2014  9:40 PM       Erline Levine, please, call in his Prednisone      Thx       ------

## 2014-06-11 NOTE — Telephone Encounter (Signed)
Done

## 2014-06-15 ENCOUNTER — Encounter: Payer: Self-pay | Admitting: Internal Medicine

## 2014-06-15 NOTE — Assessment & Plan Note (Signed)
Continue with current prescription therapy as reflected on the Med list.  

## 2014-06-15 NOTE — Progress Notes (Signed)
   Subjective:    HPI    The patient presents for a follow-up of  chronic CAD, hypertension, chronic dyslipidemia, hypogonadism controlled with medicines. F/u elev K, elev PSA.   C/o wheezing off and on x 2 mo. C/o skin lesions - L arm     Review of Systems  Constitutional: Negative for appetite change, fatigue and unexpected weight change.  HENT: Negative for congestion, nosebleeds, sneezing, sore throat and trouble swallowing.   Eyes: Negative for itching and visual disturbance.  Respiratory: Negative for cough.   Cardiovascular: Negative for chest pain, palpitations and leg swelling.  Gastrointestinal: Positive for diarrhea. Negative for nausea, blood in stool and abdominal distention.  Genitourinary: Negative for frequency and hematuria.  Musculoskeletal: Negative for back pain, gait problem, joint swelling and neck pain.  Skin: Negative for rash.  Neurological: Negative for dizziness, tremors, speech difficulty and weakness.  Psychiatric/Behavioral: Negative for sleep disturbance, dysphoric mood and agitation. The patient is nervous/anxious.        Objective:   Physical Exam  Constitutional: He is oriented to person, place, and time. He appears well-developed.  HENT:  Mouth/Throat: Oropharynx is clear and moist.  Eyes: Conjunctivae are normal. Pupils are equal, round, and reactive to light.  Neck: Normal range of motion. No JVD present. No thyromegaly present.  Cardiovascular: Normal rate, regular rhythm, normal heart sounds and intact distal pulses.  Exam reveals no gallop and no friction rub.   No murmur heard. Pulmonary/Chest: Effort normal and breath sounds normal. No respiratory distress. He has no wheezes. He has no rales. He exhibits no tenderness.  Abdominal: Soft. Bowel sounds are normal. He exhibits no distension and no mass. There is no tenderness. There is no rebound and no guarding.  Musculoskeletal: Normal range of motion. He exhibits no edema and no  tenderness.  Lymphadenopathy:    He has no cervical adenopathy.  Neurological: He is alert and oriented to person, place, and time. He has normal reflexes. No cranial nerve deficit. He exhibits normal muscle tone. Coordination normal.  Skin: Skin is warm and dry. No rash noted.  Psychiatric: His behavior is normal. Judgment and thought content normal.  R dosal foot is swollen and tender    Lab Results  Component Value Date   WBC 5.3 01/30/2014   HGB 16.5 01/30/2014   HCT 50.2 01/30/2014   PLT 112.0* 01/30/2014   CHOL 146 01/30/2014   TRIG 83.0 01/30/2014   HDL 40.80 01/30/2014   ALT 23 01/30/2014   AST 22 01/30/2014   NA 140 06/10/2014   K 4.5 06/10/2014   CL 105 06/10/2014   CREATININE 1.8* 06/10/2014   BUN 14 06/10/2014   CO2 30 06/10/2014   TSH 5.78* 01/30/2014   PSA 3.30 01/30/2014   HGBA1C 5.4 05/05/2009           Assessment & Plan:

## 2014-06-15 NOTE — Assessment & Plan Note (Signed)
Continue with current diet

## 2014-06-19 ENCOUNTER — Ambulatory Visit (INDEPENDENT_AMBULATORY_CARE_PROVIDER_SITE_OTHER): Payer: Managed Care, Other (non HMO) | Admitting: Family Medicine

## 2014-06-19 ENCOUNTER — Encounter: Payer: Self-pay | Admitting: Family Medicine

## 2014-06-19 ENCOUNTER — Other Ambulatory Visit (INDEPENDENT_AMBULATORY_CARE_PROVIDER_SITE_OTHER): Payer: Managed Care, Other (non HMO)

## 2014-06-19 VITALS — BP 142/84 | HR 85 | Ht 72.0 in | Wt 224.0 lb

## 2014-06-19 DIAGNOSIS — M79609 Pain in unspecified limb: Secondary | ICD-10-CM

## 2014-06-19 DIAGNOSIS — M79671 Pain in right foot: Secondary | ICD-10-CM

## 2014-06-19 NOTE — Assessment & Plan Note (Signed)
Patient is a midfoot arthritis I think is contributing to his pain. Patient also has pes cavus bilaterally that I think is also causing increased stress. We discussed proper shoe wear, over-the-counter orthotics, icing protocol, as well as over-the-counter medications a complete beneficial. Patient is to try these interventions as well as a home exercise and come back and see me again in 3 weeks for further evaluation and treatment.

## 2014-06-19 NOTE — Patient Instructions (Addendum)
Good to meet you Ice 10 minutes 2 times a day can help.  Take tylenol 650 mg three times a day is the best evidence based medicine we have for arthritis.  Glucosamine sulfate 779m twice a day is a supplement that has been shown to help moderate to severe arthritis. Vitamin D 2000 IU daily Fish oil 2 grams daily.  Tumeric 5080mtwice daily.  Capsaicin topically up to four times a day may also help with pain. Good Shoes- Scott BettersDansko and New balance greater then 700 Shoe inserts with good arch support may be helpful.  Spenco orthotics online and look for "total support" Water aerobics and cycling with low resistance are the best two types of exercise for arthritis. Come back and see me in 3 weeks.

## 2014-06-19 NOTE — Progress Notes (Signed)
[facility] [address], [place] [address] Phone: [phone] Subjective:    I'm seeing this patient by the request  of:  Walker Kehr, MD   CC: Right foot pain  BJY:NWGNFAOZHY Scott Oneal is a 73 y.o. male coming in with complaint of right foot pain. Patient has more of an insidious onset over the course of time. Patient states that he was seen by primary care provider for this problem and x-rays were taken. X-rays were reviewed by me. X-rays show no significant bony abnormality. Patient does have a history of gout and was given prednisone but has not noticed any significant improvement in the pain or swelling that is occurring. Patient states it is a dull aching pain that hurts more with walking but can get a dull aching pain even at rest. Patient states that from time to time the pain can wake him up at night as well. Denies any numbness or tingling. Once again this the remember any injury. Patient is a severity of 6/10.  Patient did have labs with a negative ANA, negative rheumatoid factor, and normal ESR patient's uric acid was 6.6     Past medical history, social, surgical and family history all reviewed in electronic medical record. Patient does have a past medical history significant for celiac disease  Review of Systems: No headache, visual changes, nausea, vomiting, diarrhea, constipation, dizziness, abdominal pain, skin rash, fevers, chills, night sweats, weight loss, swollen lymph nodes, body aches, joint swelling, muscle aches, chest pain, shortness of breath, mood changes.   Objective Blood pressure 142/84, pulse 85, height 6' (1.829 m), weight 224 lb (101.606 kg), SpO2 97.00%.  General: No apparent distress alert and oriented x3 mood and affect normal, dressed appropriately.  HEENT: Pupils equal, extraocular movements intact  Respiratory: Patient's speak in full sentences and does not appear short of breath  Cardiovascular: No lower  extremity edema, non tender, no erythema  Skin: Warm dry intact with no signs of infection or rash on extremities or on axial skeleton.  Abdomen: Soft nontender  Neuro: Cranial nerves II through XII are intact, neurovascularly intact in all extremities with 2+ DTRs and 2+ pulses.  Lymph: No lymphadenopathy of posterior or anterior cervical chain or axillae bilaterally.  Gait normal with good balance and coordination.  MSK:  Non tender with full range of motion and good stability and symmetric strength and tone of shoulders, elbows, wrist, hip, knee and ankles bilaterally.  Foot Exam shows patient does have severe pes cavus bilaterally right greater than left. Patient does have oversupination of the hindfoot on the right foot. When patient walks he is a midfoot striker causing overload of the midfoot. Patient is minimally tender to palpation over the dorsal aspect of the midfoot joint.  MSK US performed of: Right ankle This study was ordered, performed, and interpreted by Charlann Boxer D.O.  Foot/Ankle:   All structures visualized.  Minimal arthritis Talar dome unremarkable  Ankle mortise without effusion. Peroneus longus and brevis tendons unremarkable on long and transverse views without sheath effusions. Posterior tibialis, flexor hallucis longus, and flexor digitorum longus tendons unremarkable on long and transverse views without sheath effusions. Achilles tendon visualized along length of tendon and unremarkable on long and transverse views without sheath effusion. Anterior Talofibular Ligament and Calcaneofibular Ligaments unremarkable and intact. Deltoid Ligament unremarkable and intact. Plantar fascia intact and without effusion, normal thickness. No increased doppler signal, cap sign, or thickening of tibial cortex. Power doppler signal  normal. Foot exam shows some moderate osteoarthritic changes, patient also has what appears to be very small disposition of uric acid  crystals  IMPRESSION:  Right midfoot osteoarthritic changes        Impression and Recommendations:     This case required medical decision making of moderate complexity.

## 2014-06-27 ENCOUNTER — Encounter: Payer: Self-pay | Admitting: Gastroenterology

## 2014-07-21 ENCOUNTER — Ambulatory Visit (INDEPENDENT_AMBULATORY_CARE_PROVIDER_SITE_OTHER): Payer: Managed Care, Other (non HMO) | Admitting: *Deleted

## 2014-07-21 DIAGNOSIS — Z23 Encounter for immunization: Secondary | ICD-10-CM

## 2014-07-29 ENCOUNTER — Other Ambulatory Visit: Payer: Self-pay | Admitting: *Deleted

## 2014-07-29 MED ORDER — OLMESARTAN MEDOXOMIL 20 MG PO TABS: 20.0000 mg | ORAL_TABLET | Freq: Every day | ORAL | Status: DC

## 2014-08-05 ENCOUNTER — Ambulatory Visit: Payer: Managed Care, Other (non HMO) | Admitting: Internal Medicine

## 2014-08-07 ENCOUNTER — Other Ambulatory Visit (INDEPENDENT_AMBULATORY_CARE_PROVIDER_SITE_OTHER): Payer: Managed Care, Other (non HMO)

## 2014-08-07 DIAGNOSIS — I10 Essential (primary) hypertension: Secondary | ICD-10-CM

## 2014-08-07 DIAGNOSIS — E291 Testicular hypofunction: Secondary | ICD-10-CM

## 2014-08-07 LAB — BASIC METABOLIC PANEL
BUN: 18 mg/dL (ref 6–23)
CALCIUM: 9 mg/dL (ref 8.4–10.5)
CO2: 30 mEq/L (ref 19–32)
Chloride: 105 mEq/L (ref 96–112)
Creatinine, Ser: 1.9 mg/dL — ABNORMAL HIGH (ref 0.4–1.5)
GFR: 37.13 mL/min — ABNORMAL LOW (ref >60.00)
GLUCOSE: 90 mg/dL (ref 70–99)
Potassium: 4.3 mEq/L (ref 3.5–5.1)
SODIUM: 141 meq/L (ref 135–145)

## 2014-08-07 LAB — HEPATIC FUNCTION PANEL
ALBUMIN: 3.6 g/dL (ref 3.5–5.2)
ALT: 27 U/L (ref 0–53)
AST: 33 U/L (ref 0–37)
Alkaline Phosphatase: 52 U/L (ref 39–117)
Bilirubin, Direct: 0.2 mg/dL (ref 0.0–0.3)
Total Bilirubin: 1 mg/dL (ref 0.2–1.2)
Total Protein: 6.7 g/dL (ref 6.0–8.3)

## 2014-08-07 LAB — TESTOSTERONE: TESTOSTERONE: 173.17 ng/dL — AB (ref 300.00–890.00)

## 2014-08-11 ENCOUNTER — Ambulatory Visit (INDEPENDENT_AMBULATORY_CARE_PROVIDER_SITE_OTHER): Payer: Managed Care, Other (non HMO) | Admitting: Internal Medicine

## 2014-08-11 ENCOUNTER — Encounter: Payer: Self-pay | Admitting: Internal Medicine

## 2014-08-11 VITALS — BP 156/70 | HR 56 | Temp 98.3°F | Wt 217.0 lb

## 2014-08-11 DIAGNOSIS — I10 Essential (primary) hypertension: Secondary | ICD-10-CM

## 2014-08-11 DIAGNOSIS — M79671 Pain in right foot: Secondary | ICD-10-CM

## 2014-08-11 DIAGNOSIS — M545 Low back pain, unspecified: Secondary | ICD-10-CM

## 2014-08-11 DIAGNOSIS — F411 Generalized anxiety disorder: Secondary | ICD-10-CM

## 2014-08-11 DIAGNOSIS — M79672 Pain in left foot: Secondary | ICD-10-CM | POA: Insufficient documentation

## 2014-08-11 DIAGNOSIS — E034 Atrophy of thyroid (acquired): Secondary | ICD-10-CM

## 2014-08-11 DIAGNOSIS — E038 Other specified hypothyroidism: Secondary | ICD-10-CM

## 2014-08-11 MED ORDER — NITROGLYCERIN 0.4 MG SL SUBL: 0.4000 mg | SUBLINGUAL_TABLET | SUBLINGUAL | Status: DC | PRN

## 2014-08-11 MED ORDER — COLCHICINE 0.6 MG PO TABS: 0.6000 mg | ORAL_TABLET | Freq: Every day | ORAL | Status: DC

## 2014-08-11 NOTE — Assessment & Plan Note (Signed)
Situational - family stress  Potential benefits of a long term benzodiazepines  use as well as potential risks  and complications were explained to the patient and were aknowledged.

## 2014-08-11 NOTE — Assessment & Plan Note (Signed)
Continue with current prescription therapy as reflected on the Med list.  

## 2014-08-11 NOTE — Progress Notes (Signed)
Pre visit review using our clinic review tool, if applicable. No additional management support is needed unless otherwise documented below in the visit note. 

## 2014-08-11 NOTE — Assessment & Plan Note (Signed)
Continue with current prescription therapy as reflected on the Med list. Labs

## 2014-08-11 NOTE — Assessment & Plan Note (Addendum)
Dr Tamala Julian f/up Continue with current prescription therapy as reflected on the Med list.

## 2014-08-11 NOTE — Assessment & Plan Note (Addendum)
??  gout vs pseudogout. Labs. Empiric colchicine

## 2014-08-11 NOTE — Progress Notes (Signed)
   Subjective:    HPI    The patient presents for a follow-up of  chronic CAD, hypertension, chronic dyslipidemia, hypogonadism controlled with medicines. F/u elev K, elev PSA.  F/u wheezing off and on x 2 mo.   C/o R foot pain - ??gout. He saw Dr Tamala Julian     Review of Systems  Constitutional: Negative for appetite change, fatigue and unexpected weight change.  HENT: Negative for congestion, nosebleeds, sneezing, sore throat and trouble swallowing.   Eyes: Negative for itching and visual disturbance.  Respiratory: Negative for cough.   Cardiovascular: Negative for chest pain, palpitations and leg swelling.  Gastrointestinal: Positive for diarrhea. Negative for nausea, blood in stool and abdominal distention.  Genitourinary: Negative for frequency and hematuria.  Musculoskeletal: Negative for back pain, gait problem, joint swelling and neck pain.  Skin: Negative for rash.  Neurological: Negative for dizziness, tremors, speech difficulty and weakness.  Psychiatric/Behavioral: Negative for sleep disturbance, dysphoric mood and agitation. The patient is nervous/anxious.        Objective:   Physical Exam  Constitutional: He is oriented to person, place, and time. He appears well-developed.  HENT:  Mouth/Throat: Oropharynx is clear and moist.  Eyes: Conjunctivae are normal. Pupils are equal, round, and reactive to light.  Neck: Normal range of motion. No JVD present. No thyromegaly present.  Cardiovascular: Normal rate, regular rhythm, normal heart sounds and intact distal pulses.  Exam reveals no gallop and no friction rub.   No murmur heard. Pulmonary/Chest: Effort normal and breath sounds normal. No respiratory distress. He has no wheezes. He has no rales. He exhibits no tenderness.  Abdominal: Soft. Bowel sounds are normal. He exhibits no distension and no mass. There is no tenderness. There is no rebound and no guarding.  Musculoskeletal: Normal range of motion. He exhibits no  edema and no tenderness.  Lymphadenopathy:    He has no cervical adenopathy.  Neurological: He is alert and oriented to person, place, and time. He has normal reflexes. No cranial nerve deficit. He exhibits normal muscle tone. Coordination normal.  Skin: Skin is warm and dry. No rash noted.  Psychiatric: His behavior is normal. Judgment and thought content normal.  R dosal foot is not swollen and tender    Lab Results  Component Value Date   WBC 5.3 01/30/2014   HGB 16.5 01/30/2014   HCT 50.2 01/30/2014   PLT 112.0* 01/30/2014   CHOL 146 01/30/2014   TRIG 83.0 01/30/2014   HDL 40.80 01/30/2014   ALT 27 08/07/2014   AST 33 08/07/2014   NA 141 08/07/2014   K 4.3 08/07/2014   CL 105 08/07/2014   CREATININE 1.9* 08/07/2014   BUN 18 08/07/2014   CO2 30 08/07/2014   TSH 5.78* 01/30/2014   PSA 3.30 01/30/2014   HGBA1C 5.4 05/05/2009           Assessment & Plan:

## 2014-08-11 NOTE — Assessment & Plan Note (Signed)
??  gout vs pseudogout ?cyst

## 2014-08-11 NOTE — Assessment & Plan Note (Signed)
Chronic Dr Diona Fanti 6/15:  Dr Diona Fanti: pt was told ok to cont testosterone and split it to weekly  Potential benefits of a long term testosterone use as well as potential risks  and complications were explained to the patient and were aknowledged.

## 2014-09-03 ENCOUNTER — Encounter: Payer: Self-pay | Admitting: Nurse Practitioner

## 2014-09-05 ENCOUNTER — Other Ambulatory Visit (HOSPITAL_COMMUNITY): Payer: Self-pay | Admitting: *Deleted

## 2014-09-05 DIAGNOSIS — I6523 Occlusion and stenosis of bilateral carotid arteries: Secondary | ICD-10-CM

## 2014-09-10 ENCOUNTER — Other Ambulatory Visit: Payer: Self-pay | Admitting: Internal Medicine

## 2014-09-11 ENCOUNTER — Encounter: Payer: Self-pay | Admitting: Nurse Practitioner

## 2014-09-11 ENCOUNTER — Ambulatory Visit (INDEPENDENT_AMBULATORY_CARE_PROVIDER_SITE_OTHER): Payer: Managed Care, Other (non HMO) | Admitting: Nurse Practitioner

## 2014-09-11 ENCOUNTER — Telehealth: Payer: Self-pay | Admitting: *Deleted

## 2014-09-11 VITALS — BP 138/60 | HR 80 | Ht 72.0 in | Wt 217.6 lb

## 2014-09-11 DIAGNOSIS — Z86010 Personal history of colonic polyps: Secondary | ICD-10-CM

## 2014-09-11 MED ORDER — MOVIPREP 100 G PO SOLR: 1.0000 | Freq: Once | ORAL | Status: DC

## 2014-09-11 NOTE — Telephone Encounter (Signed)
09/11/2014   RE: DACEN FRAYRE DOB: 11/19/1940 MRN: [id number]   Dear Dr. Alain Marion,    We have scheduled the above patient for an Colonoscopy. Our records show that he is on anticoagulation therapy.   Please advise as to how long the patient may come off his therapy of Plavix prior to the procedure, which is scheduled for 11-18-2014.  Please route the completed form to Evette Georges., CMA.   Sincerely,    Hope Pigeon

## 2014-09-11 NOTE — Telephone Encounter (Signed)
Dear Claiborne Billings, Ok to hold/re-start Plavix per your protocol Thx

## 2014-09-11 NOTE — Patient Instructions (Signed)

## 2014-09-12 ENCOUNTER — Encounter: Payer: Self-pay | Admitting: Nurse Practitioner

## 2014-09-12 DIAGNOSIS — Z86010 Personal history of colonic polyps: Secondary | ICD-10-CM | POA: Insufficient documentation

## 2014-09-12 NOTE — Telephone Encounter (Signed)
Dr. Alain Marion,   Our normal protocol for holding Plavix is 5-7 days Which will be best for you for holding Plavix?

## 2014-09-12 NOTE — Progress Notes (Signed)
HPI :  Patient is a 73 year old male known to Dr. Fuller Plan for a history of celiac disease and adenomatous colon polyps. He is due for surveillance colonoscopy. No GI complaints. Specifically, patient has no bowel changes, blood in stool, abdominal pain or unexplained weight loss. He feels well  Past Medical History  Diagnosis Date  . Coronary atherosclerosis of unspecified type of vessel, native or graft   . Old myocardial infarction   . Unspecified essential hypertension   . Pure hypercholesterolemia   . Long term (current) use of anticoagulants   . Other specified cardiac dysrhythmias(427.89)   . Esophageal reflux   . Other testicular hypofunction   . Personal history of colonic polyps   . Osteoporosis, unspecified   . Cholelithiasis   . Celiac disease   . Unspecified asthma(493.90)   . Unspecified hypothyroidism   . Gout, unspecified   . Benign neoplasm of colon   . Renal insufficiency     chronic  . Hyperparathyroidism     Family History  Problem Relation Age of Onset  . Hypertension    . Asthma Neg Hx   . Colon cancer Neg Hx   . Lymphoma Father   . Cancer Father     lymphoma  . Vision loss Maternal Grandmother    History  Substance Use Topics  . Smoking status: Never Smoker   . Smokeless tobacco: Never Used  . Alcohol Use: No   Current Outpatient Prescriptions  Medication Sig Dispense Refill  . albuterol (PROAIR HFA) 108 (90 BASE) MCG/ACT inhaler Inhale 2 puffs into the lungs every 6 (six) hours as needed for wheezing or shortness of breath. 1 Inhaler 3  . aspirin 81 MG tablet Take 81 mg by mouth daily.      . budesonide-formoterol (SYMBICORT) 160-4.5 MCG/ACT inhaler Inhale 2 puffs into the lungs 2 (two) times daily. 2 Inhaler 6  . calcitRIOL (ROCALTROL) 0.25 MCG capsule Take 1 capsule (0.25 mcg total) by mouth daily. 90 capsule 3  . carvedilol (COREG) 25 MG tablet Take 1 tablet (25 mg total) by mouth 2 (two) times daily with a meal. 180 tablet 2  .  clonazePAM (KLONOPIN) 0.25 MG disintegrating tablet Take 1 tablet (0.25 mg total) by mouth 2 (two) times daily as needed (anxiety). 60 tablet 1  . clopidogrel (PLAVIX) 75 MG tablet Take 1 tablet (75 mg total) by mouth daily. 90 tablet 3  . fish oil-omega-3 fatty acids 1000 MG capsule Take 1 g by mouth daily.      Marland Kitchen levothyroxine (SYNTHROID, LEVOTHROID) 125 MCG tablet Take 1 tablet (125 mcg total) by mouth daily before breakfast. 90 tablet 3  . nitroGLYCERIN (NITROSTAT) 0.4 MG SL tablet Place 1 tablet (0.4 mg total) under the tongue every 5 (five) minutes as needed. 20 tablet 5  . olmesartan (BENICAR) 20 MG tablet Take 1 tablet (20 mg total) by mouth daily. 90 tablet 2  . SAFETY-LOK 3CC SYR 23GX1" 23G X 1" 3 ML MISC USE AS DIRECTED BY PRESCRIBER 300 each 1  . sildenafil (VIAGRA) 100 MG tablet Take 100 mg by mouth daily as needed.      . simvastatin (ZOCOR) 10 MG tablet Hold while on diflucan then resume 90 tablet 3  . Spacer/Aero-Holding Chambers (AEROCHAMBER MV) inhaler Use as instructed 1 each 0  . traMADol (ULTRAM) 50 MG tablet Take 1-2 tablets (50-100 mg total) by mouth 2 (two) times daily as needed. 60 tablet 1  . triamcinolone cream (KENALOG) 0.1 %  Apply 1 application topically 2 (two) times daily as needed.    Marland Kitchen MOVIPREP 100 G SOLR Take 1 kit (200 g total) by mouth once. 1 kit 0  . testosterone cypionate (DEPOTESTOTERONE CYPIONATE) 200 MG/ML injection INJECT 2MLS EVERY 2 WEEKS 10 mL 2   No current facility-administered medications for this visit.   Allergies  Allergen Reactions  . Allantoin-Pramoxine   . Metoprolol Tartrate     REACTION: fatigue   Review of Systems: All systems reviewed and negative except where noted in HPI.   Physical Exam: BP 138/60 mmHg  Pulse 80  Ht 6' (1.829 m)  Wt 217 lb 9.6 oz (98.703 kg)  BMI 29.51 kg/m2 Constitutional: Pleasant,well-developed, white male in no acute distress. HEENT: Normocephalic and atraumatic. Conjunctivae are normal. No scleral  icterus. Neck supple.  Cardiovascular: Normal rate, regular rhythm.  Pulmonary/chest: Effort normal and breath sounds normal. No wheezing, rales or rhonchi. Abdominal: Soft, nondistended, nontender. Bowel sounds active throughout. There are no masses palpable. No hepatomegaly. Extremities: no edema Lymphadenopathy: No cervical adenopathy noted. Neurological: Alert and oriented to person place and time. Skin: Skin is warm and dry. No rashes noted. Psychiatric: Normal mood and affect. Behavior is normal.   ASSESSMENT AND PLAN:  1. Pleasant 74 year old male with history of adenomatous colon polyps due for surveillance exam. The risks, benefits, and alternatives to colonoscopy with possible biopsy and possible polypectomy were discussed with the patient and he consents to proceed.   2. CAD, s/p DES (remote). On chronic plavix. We will contact prescribing provider about holding Plavix for colonoscopy.   3. Celiac disease (biospy).   4. Thyroid disease / CKD / asthma / gout

## 2014-09-12 NOTE — Progress Notes (Signed)
Reviewed and agree with management plan.  Nam Vossler T. Klarissa Mcilvain, MD FACG 

## 2014-09-14 NOTE — Telephone Encounter (Signed)
OK 5 days Thx!

## 2014-09-15 NOTE — Telephone Encounter (Signed)
lmom for patient to call back

## 2014-09-15 NOTE — Telephone Encounter (Signed)
Patient called back I advised patient that per Dr. Alain Marion okay to hold Plavix five days prior to procedure

## 2014-09-22 ENCOUNTER — Encounter: Payer: Self-pay | Admitting: Gastroenterology

## 2014-09-22 ENCOUNTER — Ambulatory Visit (HOSPITAL_COMMUNITY): Payer: Medicare HMO | Attending: Cardiology | Admitting: *Deleted

## 2014-09-22 DIAGNOSIS — E785 Hyperlipidemia, unspecified: Secondary | ICD-10-CM | POA: Insufficient documentation

## 2014-09-22 DIAGNOSIS — I2510 Atherosclerotic heart disease of native coronary artery without angina pectoris: Secondary | ICD-10-CM | POA: Insufficient documentation

## 2014-09-22 DIAGNOSIS — I6523 Occlusion and stenosis of bilateral carotid arteries: Secondary | ICD-10-CM

## 2014-09-22 DIAGNOSIS — I10 Essential (primary) hypertension: Secondary | ICD-10-CM | POA: Diagnosis not present

## 2014-09-22 NOTE — Progress Notes (Signed)
Carotid duplex performed 

## 2014-10-08 ENCOUNTER — Other Ambulatory Visit: Payer: Self-pay | Admitting: Internal Medicine

## 2014-11-18 ENCOUNTER — Encounter: Payer: Self-pay | Admitting: Gastroenterology

## 2014-11-18 ENCOUNTER — Ambulatory Visit (AMBULATORY_SURGERY_CENTER): Payer: Managed Care, Other (non HMO) | Admitting: Gastroenterology

## 2014-11-18 VITALS — BP 125/80 | HR 64 | Temp 96.3°F | Resp 7 | Ht 72.0 in | Wt 217.0 lb

## 2014-11-18 DIAGNOSIS — D122 Benign neoplasm of ascending colon: Secondary | ICD-10-CM

## 2014-11-18 DIAGNOSIS — D123 Benign neoplasm of transverse colon: Secondary | ICD-10-CM

## 2014-11-18 DIAGNOSIS — Z86010 Personal history of colonic polyps: Secondary | ICD-10-CM

## 2014-11-18 MED ORDER — SODIUM CHLORIDE 0.9 % IV SOLN: 500.0000 mL | INTRAVENOUS | Status: DC

## 2014-11-18 NOTE — Progress Notes (Signed)
Procedure ends, to recovery, report given and VSS. 

## 2014-11-18 NOTE — Patient Instructions (Signed)
YOU HAD AN ENDOSCOPIC PROCEDURE TODAY AT [facility]: Refer to the procedure report that was given to you for any specific questions about what was found during the examination.  If the procedure report does not answer your questions, please call your gastroenterologist to clarify.  If you requested that your care partner not be given the details of your procedure findings, then the procedure report has been included in a sealed envelope for you to review at your convenience later.  YOU SHOULD EXPECT: Some feelings of bloating in the abdomen. Passage of more gas than usual.  Walking can help get rid of the air that was put into your GI tract during the procedure and reduce the bloating. If you had a lower endoscopy (such as a colonoscopy or flexible sigmoidoscopy) you may notice spotting of blood in your stool or on the toilet paper. If you underwent a bowel prep for your procedure, then you may not have a normal bowel movement for a few days.  DIET: Your first meal following the procedure should be a light meal and then it is ok to progress to your normal diet.  A half-sandwich or bowl of soup is an example of a good first meal.  Heavy or fried foods are harder to digest and may make you feel nauseous or bloated.  Likewise meals heavy in dairy and vegetables can cause extra gas to form and this can also increase the bloating.  Drink plenty of fluids but you should avoid alcoholic beverages for 24 hours.  ACTIVITY: Your care partner should take you home directly after the procedure.  You should plan to take it easy, moving slowly for the rest of the day.  You can resume normal activity the day after the procedure however you should NOT DRIVE or use heavy machinery for 24 hours (because of the sedation medicines used during the test).    SYMPTOMS TO REPORT IMMEDIATELY: A gastroenterologist can be reached at any hour.  During normal business hours, 8:30 AM to 5:00 PM Monday through Friday,  call [phone].  After hours and on weekends, please call the GI answering service at [phone] who will take a message and have the physician on call contact you.   Following lower endoscopy (colonoscopy or flexible sigmoidoscopy):  Excessive amounts of blood in the stool  Significant tenderness or worsening of abdominal pains  Swelling of the abdomen that is new, acute  Fever of 100F or higher   FOLLOW UP: If any biopsies were taken you will be contacted by phone or by letter within the next 1-3 weeks.  Call your gastroenterologist if you have not heard about the biopsies in 3 weeks.  Our staff will call the home number listed on your records the next business day following your procedure to check on you and address any questions or concerns that you may have at that time regarding the information given to you following your procedure. This is a courtesy call and so if there is no answer at the home number and we have not heard from you through the emergency physician on call, we will assume that you have returned to your regular daily activities without incident.  SIGNATURES/CONFIDENTIALITY: You and/or your care partner have signed paperwork which will be entered into your electronic medical record.  These signatures attest to the fact that that the information above on your After Visit Summary has been reviewed and is understood.  Full responsibility of the confidentiality of  this discharge information lies with you and/or your care-partner   .handouts on polyps and hemorrhoids given to you today  Restart Aspirin and Plavix tomorrow per Dr Fuller Plan

## 2014-11-18 NOTE — Progress Notes (Signed)
Iv removal charted rt ac charted in error

## 2014-11-18 NOTE — Op Note (Signed)
[facility]  [address] [place], [address]   COLONOSCOPY PROCEDURE REPORT  PATIENT: Scott, Oneal  MR#: [id number] BIRTHDATE: 31-May-1941 , 73  yrs. old GENDER: male ENDOSCOPIST: Ladene Artist, MD, [facility] PROCEDURE DATE:  11/18/2014 PROCEDURE:   Colonoscopy with snare polypectomy First Screening Colonoscopy - Avg.  risk and is 50 yrs.  old or older - No.  Prior Negative Screening - Now for repeat screening. N/A  History of Adenoma - Now for follow-up colonoscopy & has been > or = to 3 yrs.  Yes hx of adenoma.  Has been 3 or more years since last colonoscopy.  Polyps Removed Today? Yes. ASA CLASS:   Class III INDICATIONS:surveillance colonoscopy based on a history of adenomatous colonic polyp(s). MEDICATIONS: Monitored anesthesia care and Propofol 200 mg IV DESCRIPTION OF PROCEDURE:   After the risks benefits and alternatives of the procedure were thoroughly explained, informed consent was obtained.  The digital rectal exam revealed no abnormalities of the rectum.   The [id number]  endoscope was introduced through the anus and advanced to the cecum, which was identified by both the appendix and ileocecal valve. No adverse events experienced.   The quality of the prep was good, using MoviPrep  The instrument was then slowly withdrawn as the colon was fully examined.  COLON FINDINGS: A sessile polyp measuring 7 mm in size was found in the ascending colon.  A polypectomy was performed with a cold snare.  The resection was complete, the polyp tissue was completely retrieved and sent to histology.   A sessile polyp measuring 12 mm in size was found in the transverse colon.  A polypectomy was performed using snare cautery.  The resection was complete, the polyp tissue was completely retrieved and sent to histology.   The examination was otherwise normal.  Retroflexed views revealed internal Grade I hemorrhoids. The time to cecum=1 minutes 48 seconds.   Withdrawal time=12 minutes 40 seconds.  The scope was withdrawn and the procedure completed. COMPLICATIONS: There were no immediate complications.  ENDOSCOPIC IMPRESSION: 1.   Sessile polyp in the ascending colon; polypectomy performed with a cold snare 2.   Sessile polyp in the transverse colon; polypectomy performed using snare cautery 3.   Grade I internal hemorrhoids  RECOMMENDATIONS: 1.  Await pathology results 2.  Repeat Colonoscopy in 3 years. 3.  Resume Plavix and ASA tomorrow  eSigned:  Ladene Artist, MD, [facility] 11/18/2014 8:29 AM

## 2014-11-18 NOTE — Progress Notes (Signed)
Called to room to assist during endoscopic procedure.  Patient ID and intended procedure confirmed with present staff. Received instructions for my participation in the procedure from the performing physician.  

## 2014-11-19 ENCOUNTER — Telehealth: Payer: Self-pay | Admitting: *Deleted

## 2014-11-19 NOTE — Telephone Encounter (Signed)
Message left

## 2014-11-25 ENCOUNTER — Encounter: Payer: Self-pay | Admitting: Gastroenterology

## 2014-11-25 ENCOUNTER — Inpatient Hospital Stay (HOSPITAL_COMMUNITY)
Admission: EM | Admit: 2014-11-25 | Discharge: 2014-11-27 | DRG: 919 | Disposition: A | Discharge status: Home / Self Care | Payer: Medicare HMO | Attending: Internal Medicine | Admitting: Internal Medicine

## 2014-11-25 ENCOUNTER — Encounter (HOSPITAL_COMMUNITY): Payer: Self-pay | Admitting: Emergency Medicine

## 2014-11-25 DIAGNOSIS — Z86010 Personal history of colonic polyps: Secondary | ICD-10-CM

## 2014-11-25 DIAGNOSIS — E291 Testicular hypofunction: Secondary | ICD-10-CM | POA: Diagnosis present

## 2014-11-25 DIAGNOSIS — R578 Other shock: Secondary | ICD-10-CM | POA: Diagnosis present

## 2014-11-25 DIAGNOSIS — E039 Hypothyroidism, unspecified: Secondary | ICD-10-CM | POA: Diagnosis present

## 2014-11-25 DIAGNOSIS — H409 Unspecified glaucoma: Secondary | ICD-10-CM | POA: Diagnosis present

## 2014-11-25 DIAGNOSIS — Z9841 Cataract extraction status, right eye: Secondary | ICD-10-CM | POA: Diagnosis not present

## 2014-11-25 DIAGNOSIS — E213 Hyperparathyroidism, unspecified: Secondary | ICD-10-CM | POA: Diagnosis present

## 2014-11-25 DIAGNOSIS — K640 First degree hemorrhoids: Secondary | ICD-10-CM | POA: Diagnosis present

## 2014-11-25 DIAGNOSIS — Y838 Other surgical procedures as the cause of abnormal reaction of the patient, or of later complication, without mention of misadventure at the time of the procedure: Secondary | ICD-10-CM | POA: Diagnosis present

## 2014-11-25 DIAGNOSIS — K921 Melena: Secondary | ICD-10-CM | POA: Diagnosis present

## 2014-11-25 DIAGNOSIS — J45909 Unspecified asthma, uncomplicated: Secondary | ICD-10-CM | POA: Diagnosis present

## 2014-11-25 DIAGNOSIS — E780 Pure hypercholesterolemia: Secondary | ICD-10-CM | POA: Diagnosis present

## 2014-11-25 DIAGNOSIS — Z7982 Long term (current) use of aspirin: Secondary | ICD-10-CM | POA: Diagnosis not present

## 2014-11-25 DIAGNOSIS — D696 Thrombocytopenia, unspecified: Secondary | ICD-10-CM | POA: Diagnosis present

## 2014-11-25 DIAGNOSIS — I252 Old myocardial infarction: Secondary | ICD-10-CM

## 2014-11-25 DIAGNOSIS — F419 Anxiety disorder, unspecified: Secondary | ICD-10-CM | POA: Diagnosis present

## 2014-11-25 DIAGNOSIS — Z9842 Cataract extraction status, left eye: Secondary | ICD-10-CM | POA: Diagnosis not present

## 2014-11-25 DIAGNOSIS — Z9889 Other specified postprocedural states: Secondary | ICD-10-CM

## 2014-11-25 DIAGNOSIS — N183 Chronic kidney disease, stage 3 (moderate): Secondary | ICD-10-CM | POA: Diagnosis present

## 2014-11-25 DIAGNOSIS — I2510 Atherosclerotic heart disease of native coronary artery without angina pectoris: Secondary | ICD-10-CM | POA: Diagnosis present

## 2014-11-25 DIAGNOSIS — D62 Acute posthemorrhagic anemia: Secondary | ICD-10-CM | POA: Diagnosis present

## 2014-11-25 DIAGNOSIS — Z7902 Long term (current) use of antithrombotics/antiplatelets: Secondary | ICD-10-CM | POA: Diagnosis not present

## 2014-11-25 DIAGNOSIS — K219 Gastro-esophageal reflux disease without esophagitis: Secondary | ICD-10-CM | POA: Diagnosis present

## 2014-11-25 DIAGNOSIS — K91840 Postprocedural hemorrhage and hematoma of a digestive system organ or structure following a digestive system procedure: Secondary | ICD-10-CM | POA: Diagnosis present

## 2014-11-25 DIAGNOSIS — K625 Hemorrhage of anus and rectum: Secondary | ICD-10-CM | POA: Diagnosis present

## 2014-11-25 DIAGNOSIS — I129 Hypertensive chronic kidney disease with stage 1 through stage 4 chronic kidney disease, or unspecified chronic kidney disease: Secondary | ICD-10-CM | POA: Diagnosis present

## 2014-11-25 DIAGNOSIS — Z955 Presence of coronary angioplasty implant and graft: Secondary | ICD-10-CM | POA: Diagnosis not present

## 2014-11-25 DIAGNOSIS — Z79899 Other long term (current) drug therapy: Secondary | ICD-10-CM

## 2014-11-25 DIAGNOSIS — Z8249 Family history of ischemic heart disease and other diseases of the circulatory system: Secondary | ICD-10-CM | POA: Diagnosis not present

## 2014-11-25 DIAGNOSIS — K922 Gastrointestinal hemorrhage, unspecified: Secondary | ICD-10-CM | POA: Diagnosis present

## 2014-11-25 DIAGNOSIS — K900 Celiac disease: Secondary | ICD-10-CM | POA: Diagnosis present

## 2014-11-25 DIAGNOSIS — M810 Age-related osteoporosis without current pathological fracture: Secondary | ICD-10-CM | POA: Diagnosis present

## 2014-11-25 LAB — TYPE AND SCREEN
ABO/RH(D): B POS
Antibody Screen: NEGATIVE

## 2014-11-25 LAB — POC OCCULT BLOOD, ED: Fecal Occult Bld: POSITIVE — AB

## 2014-11-25 LAB — HEMOGLOBIN AND HEMATOCRIT, BLOOD
HCT: 42.5 % (ref 39.0–52.0)
Hemoglobin: 14.3 g/dL (ref 13.0–17.0)

## 2014-11-25 LAB — COMPREHENSIVE METABOLIC PANEL
ALT: 18 U/L (ref 0–53)
ANION GAP: 6 (ref 5–15)
AST: 28 U/L (ref 0–37)
Albumin: 3.5 g/dL (ref 3.5–5.2)
Alkaline Phosphatase: 46 U/L (ref 39–117)
BUN: 14 mg/dL (ref 6–23)
CHLORIDE: 106 mmol/L (ref 96–112)
CO2: 26 mmol/L (ref 19–32)
CREATININE: 2.02 mg/dL — AB (ref 0.50–1.35)
Calcium: 8.9 mg/dL (ref 8.4–10.5)
GFR calc Af Amer: 36 mL/min — ABNORMAL LOW (ref >90)
GFR calc non Af Amer: 31 mL/min — ABNORMAL LOW (ref >90)
Glucose, Bld: 149 mg/dL — ABNORMAL HIGH (ref 70–99)
Potassium: 4.9 mmol/L (ref 3.5–5.1)
Sodium: 138 mmol/L (ref 135–145)
TOTAL PROTEIN: 5.3 g/dL — AB (ref 6.0–8.3)
Total Bilirubin: 0.8 mg/dL (ref 0.3–1.2)

## 2014-11-25 LAB — CBC
HEMATOCRIT: 43.7 % (ref 39.0–52.0)
HEMOGLOBIN: 14.8 g/dL (ref 13.0–17.0)
MCH: 30.6 pg (ref 26.0–34.0)
MCHC: 33.9 g/dL (ref 30.0–36.0)
MCV: 90.5 fL (ref 78.0–100.0)
PLATELETS: 107 10*3/uL — AB (ref 150–400)
RBC: 4.83 MIL/uL (ref 4.22–5.81)
RDW: 14.5 % (ref 11.5–15.5)
WBC: 6.5 10*3/uL (ref 4.0–10.5)

## 2014-11-25 LAB — ABO/RH: ABO/RH(D): B POS

## 2014-11-25 MED ORDER — ONDANSETRON HCL 4 MG/2ML IJ SOLN
4.0000 mg | Freq: Once | INTRAMUSCULAR | Status: AC
  Administered 2014-11-25: 4 mg via INTRAVENOUS

## 2014-11-25 MED ORDER — SODIUM CHLORIDE 0.9 % IV BOLUS (SEPSIS)
1000.0000 mL | Freq: Once | INTRAVENOUS | Status: AC
  Administered 2014-11-25: 1000 mL via INTRAVENOUS

## 2014-11-25 NOTE — ED Notes (Signed)
Gave pt ice chips, per RN

## 2014-11-25 NOTE — ED Notes (Signed)
Pt is from home, pt reports he had a colonoscopy on the 18th or 19th of January. Pt reports rectal bleeding that started tonight, reports two episodes of copious amounts of blood in his stool. Pt reports intermittent lightheadedness. Pt HTN.

## 2014-11-25 NOTE — ED Notes (Signed)
Pt's wife grabbed this RN, pt feels very nauseated, weak, lightheaded. and states he feels like he is going to pass out. BP taken (79/54), cold compress applied to pt's head, 1L NS hung, pt placed in the trendelenburg position, new IV started, and H&H recollected for processing. MD informed and at [facility]. Pt's HR did not increase with hypotension. No BM since last documented at 2323.

## 2014-11-25 NOTE — ED Provider Notes (Signed)
CSN: [id number]     Arrival date & time 11/25/14  2137 History   First MD Initiated Contact with Patient 11/25/14 2144     Chief Complaint  Patient presents with  . Rectal Bleeding  . Dizziness     (Consider location/radiation/quality/duration/timing/severity/associated sxs/prior Treatment) HPI Comments: Patient is a 74 year old male with history of urinary artery disease. He presents today with complaints of rectal bleeding. He had a colonoscopy performed last week and states that he had 2 polyps removed. He was doing fine until this evening when he felt a sudden urge to have what he thought would be diarrhea. He ran upstairs to the bathroom and passed a significant quantity of bright red blood. After this episode resolved, he had a similar second episode, then decided to come to the ER. While in the emergency department he passed more blood. He is denying any fevers or chills. He is denying any rectal or abdominal pain. He states this has never happened to him before. He does report that he feels somewhat dizzy but denies shortness of breath.  Patient is a 74 y.o. male presenting with hematochezia and dizziness. The history is provided by the patient.  Rectal Bleeding Quality:  Bright red Amount:  Moderate Duration:  1 hour Timing:  Constant Progression:  Worsening Chronicity:  New Similar prior episodes: no   Relieved by:  Nothing Worsened by:  Nothing tried Associated symptoms: dizziness   Dizziness   Past Medical History  Diagnosis Date  . Coronary atherosclerosis of unspecified type of vessel, native or graft   . Old myocardial infarction   . Unspecified essential hypertension   . Pure hypercholesterolemia   . Long term (current) use of anticoagulants   . Other specified cardiac dysrhythmias(427.89)   . Esophageal reflux   . Other malaise and fatigue   . Other testicular hypofunction   . Personal history of colonic polyps   . Osteoporosis, unspecified   . Calculus of  gallbladder without mention of cholecystitis   . Cholelithiasis   . Celiac disease   . Loss of weight   . Unspecified asthma(493.90)   . Diarrhea   . Unspecified hypothyroidism   . Gout, unspecified   . Benign neoplasm of colon   . Renal insufficiency     chronic  . Hyperparathyroidism   . Anxiety   . Arthritis   . Cataract     bilateral cateracts removed  . Glaucoma    Past Surgical History  Procedure Laterality Date  . Knee surgery      right  . Coronary stent placement  2011    Cypher; in distal circumflex artery  . Cystoscopy  1998  . Shoulder surgery Left 9/14    Dr Shara Blazing  . Colonoscopy    . Upper gastrointestinal endoscopy     Family History  Problem Relation Age of Onset  . Hypertension    . Asthma Neg Hx   . Colon cancer Neg Hx   . Esophageal cancer Neg Hx   . Rectal cancer Neg Hx   . Stomach cancer Neg Hx   . Lymphoma Father   . Cancer Father     lymphoma  . Vision loss Maternal Grandmother    History  Substance Use Topics  . Smoking status: Never Smoker   . Smokeless tobacco: Never Used  . Alcohol Use: No    Review of Systems  Gastrointestinal: Positive for hematochezia.  Neurological: Positive for dizziness.  All other systems reviewed and are negative.  Allergies  Allantoin-pramoxine and Metoprolol tartrate  Home Medications   Prior to Admission medications   Medication Sig Start Date End Date Taking? Authorizing Provider  albuterol (PROAIR HFA) 108 (90 BASE) MCG/ACT inhaler Inhale 2 puffs into the lungs every 6 (six) hours as needed for wheezing or shortness of breath. 05/14/14   Elsie Stain, MD  aspirin 81 MG tablet Take 81 mg by mouth daily.      Historical Provider, MD  budesonide-formoterol (SYMBICORT) 160-4.5 MCG/ACT inhaler Inhale 2 puffs into the lungs 2 (two) times daily. 05/14/14   Elsie Stain, MD  calcitRIOL (ROCALTROL) 0.25 MCG capsule Take 1 capsule (0.25 mcg total) by mouth daily. 04/15/14   Aleksei Plotnikov V,  MD  carvedilol (COREG) 25 MG tablet TAKE 1 TABLET BY MOUTH TWICE A DAY WITH A MEAL 10/08/14   Aleksei Plotnikov V, MD  clonazePAM (KLONOPIN) 0.25 MG disintegrating tablet Take 1 tablet (0.25 mg total) by mouth 2 (two) times daily as needed (anxiety). Patient not taking: Reported on 11/18/2014 11/27/13   Lew Dawes V, MD  clopidogrel (PLAVIX) 75 MG tablet Take 1 tablet (75 mg total) by mouth daily. 04/17/14   Aleksei Plotnikov V, MD  fish oil-omega-3 fatty acids 1000 MG capsule Take 1 g by mouth daily.      Historical Provider, MD  levothyroxine (SYNTHROID, LEVOTHROID) 125 MCG tablet Take 1 tablet (125 mcg total) by mouth daily before breakfast. 02/04/14   Lew Dawes V, MD  nitroGLYCERIN (NITROSTAT) 0.4 MG SL tablet Place 1 tablet (0.4 mg total) under the tongue every 5 (five) minutes as needed. Patient not taking: Reported on 11/18/2014 08/11/14   Aleksei Plotnikov V, MD  olmesartan (BENICAR) 20 MG tablet Take 1 tablet (20 mg total) by mouth daily. 07/29/14   Aleksei Plotnikov V, MD  SAFETY-LOK 3CC SYR 23GX1" 23G X 1" 3 ML MISC USE AS DIRECTED BY PRESCRIBER 06/11/14   Lew Dawes V, MD  sildenafil (VIAGRA) 100 MG tablet Take 100 mg by mouth daily as needed.      Historical Provider, MD  simvastatin (ZOCOR) 10 MG tablet Hold while on diflucan then resume 05/14/14   Elsie Stain, MD  Spacer/Aero-Holding Chambers (AEROCHAMBER MV) inhaler Use as instructed 05/14/14   Elsie Stain, MD  testosterone cypionate (DEPOTESTOTERONE CYPIONATE) 200 MG/ML injection INJECT 2MLS EVERY 2 WEEKS 09/11/14   Aleksei Plotnikov V, MD  traMADol (ULTRAM) 50 MG tablet Take 1-2 tablets (50-100 mg total) by mouth 2 (two) times daily as needed. 06/10/14   Aleksei Plotnikov V, MD  triamcinolone cream (KENALOG) 0.1 % Apply 1 application topically 2 (two) times daily as needed. 04/30/14   Aleksei Plotnikov V, MD   BP 162/86 mmHg  Pulse 80  Temp(Src) 98.2 F (36.8 C) (Oral)  Resp 28  Ht 6' (1.829 m)  Wt 215 lb  (97.523 kg)  BMI 29.15 kg/m2  SpO2 100% Physical Exam  Constitutional: He is oriented to person, place, and time. He appears well-developed and well-nourished.  HENT:  Head: Normocephalic and atraumatic.  Mouth/Throat: Oropharynx is clear and moist.  Neck: Normal range of motion. Neck supple.  Cardiovascular: Normal rate, regular rhythm and normal heart sounds.   No murmur heard. Pulmonary/Chest: Effort normal and breath sounds normal. No respiratory distress. He has no wheezes.  Abdominal: Soft. Bowel sounds are normal. He exhibits no distension. There is no tenderness.  Musculoskeletal: Normal range of motion. He exhibits no edema.  Neurological: He is alert and oriented to person, place,  and time.  Skin: Skin is warm and dry. He is not diaphoretic.  Nursing note and vitals reviewed.   ED Course  Procedures (including critical care time) Labs Review Labs Reviewed  POC OCCULT BLOOD, ED - Abnormal; Notable for the following:    Fecal Occult Bld POSITIVE (*)    All other components within normal limits  CBC  COMPREHENSIVE METABOLIC PANEL  TYPE AND SCREEN    Imaging Review No results found.   EKG Interpretation None      MDM   Final diagnoses:  None    Patient presents with rectal bleeding 1 week status post polypectomy during a routine colonoscopy. His vital signs are stable and hemoglobin is 14.8. He has had 2 additional episodes of this in the emergency department. I spoke with Dr. Deatra Ina from gastroenterology who is recommending admission for observation. If his bleeding does not stop by morning they will discuss a repeat colonoscopy and cauterization.    Veryl Speak, MD 11/25/14 [phone]

## 2014-11-26 ENCOUNTER — Encounter (HOSPITAL_COMMUNITY): Payer: Self-pay | Admitting: *Deleted

## 2014-11-26 DIAGNOSIS — K922 Gastrointestinal hemorrhage, unspecified: Secondary | ICD-10-CM

## 2014-11-26 DIAGNOSIS — K633 Ulcer of intestine: Secondary | ICD-10-CM

## 2014-11-26 DIAGNOSIS — K625 Hemorrhage of anus and rectum: Secondary | ICD-10-CM

## 2014-11-26 LAB — CBC
HCT: 37.5 % — ABNORMAL LOW (ref 39.0–52.0)
HCT: 35.6 % — ABNORMAL LOW (ref 39.0–52.0)
HEMATOCRIT: 39.7 % (ref 39.0–52.0)
Hemoglobin: 13.2 g/dL (ref 13.0–17.0)
Hemoglobin: 12.5 g/dL — ABNORMAL LOW (ref 13.0–17.0)
Hemoglobin: 11.8 g/dL — ABNORMAL LOW (ref 13.0–17.0)
MCH: 30.6 pg (ref 26.0–34.0)
MCH: 30.5 pg (ref 26.0–34.0)
MCH: 30.8 pg (ref 26.0–34.0)
MCHC: 33.2 g/dL (ref 30.0–36.0)
MCHC: 33.3 g/dL (ref 30.0–36.0)
MCHC: 33.1 g/dL (ref 30.0–36.0)
MCV: 92.1 fL (ref 78.0–100.0)
MCV: 91.5 fL (ref 78.0–100.0)
MCV: 93 fL (ref 78.0–100.0)
PLATELETS: 95 10*3/uL — AB (ref 150–400)
PLATELETS: 87 10*3/uL — AB (ref 150–400)
PLATELETS: 89 10*3/uL — AB (ref 150–400)
RBC: 4.31 MIL/uL (ref 4.22–5.81)
RBC: 4.1 MIL/uL — ABNORMAL LOW (ref 4.22–5.81)
RBC: 3.83 MIL/uL — AB (ref 4.22–5.81)
RDW: 14.4 % (ref 11.5–15.5)
RDW: 14.7 % (ref 11.5–15.5)
RDW: 14.7 % (ref 11.5–15.5)
WBC: 8.1 10*3/uL (ref 4.0–10.5)
WBC: 5.8 10*3/uL (ref 4.0–10.5)
WBC: 5.2 10*3/uL (ref 4.0–10.5)

## 2014-11-26 LAB — BASIC METABOLIC PANEL
Anion gap: 5 (ref 5–15)
BUN: 14 mg/dL (ref 6–23)
CHLORIDE: 108 mmol/L (ref 96–112)
CO2: 26 mmol/L (ref 19–32)
Calcium: 7.9 mg/dL — ABNORMAL LOW (ref 8.4–10.5)
Creatinine, Ser: 1.89 mg/dL — ABNORMAL HIGH (ref 0.50–1.35)
GFR calc Af Amer: 39 mL/min — ABNORMAL LOW (ref >90)
GFR calc non Af Amer: 34 mL/min — ABNORMAL LOW (ref >90)
GLUCOSE: 133 mg/dL — AB (ref 70–99)
Potassium: 4.6 mmol/L (ref 3.5–5.1)
Sodium: 139 mmol/L (ref 135–145)

## 2014-11-26 LAB — MAGNESIUM: Magnesium: 1.7 mg/dL (ref 1.5–2.5)

## 2014-11-26 LAB — PHOSPHORUS: Phosphorus: 2.5 mg/dL (ref 2.3–4.6)

## 2014-11-26 LAB — TROPONIN I: Troponin I: <0.03 ng/mL (ref <0.031)

## 2014-11-26 LAB — GLUCOSE, CAPILLARY: Glucose-Capillary: 125 mg/dL — ABNORMAL HIGH (ref 70–99)

## 2014-11-26 LAB — PROTIME-INR
INR: 1.2 (ref 0.00–1.49)
Prothrombin Time: 15.4 seconds — ABNORMAL HIGH (ref 11.6–15.2)

## 2014-11-26 LAB — MRSA PCR SCREENING: MRSA BY PCR: NEGATIVE

## 2014-11-26 MED ORDER — SODIUM CHLORIDE 0.9 % IV SOLN: 250.0000 mL | INTRAVENOUS | Status: DC | PRN

## 2014-11-26 MED ORDER — PEG-KCL-NACL-NASULF-NA ASC-C 100 G PO SOLR
0.5000 | Freq: Once | ORAL | Status: AC
  Administered 2014-11-26: 100 g via ORAL
  Filled 2014-11-26: qty 1

## 2014-11-26 MED ORDER — CETYLPYRIDINIUM CHLORIDE 0.05 % MT LIQD
7.0000 mL | Freq: Two times a day (BID) | OROMUCOSAL | Status: DC
  Administered 2014-11-26 – 2014-11-27 (×3): 7 mL via OROMUCOSAL

## 2014-11-26 MED ORDER — SODIUM CHLORIDE 0.9 % IV SOLN
INTRAVENOUS | Status: DC
  Administered 2014-11-26: 01:00:00 via INTRAVENOUS
  Administered 2014-11-26: 1000 mL via INTRAVENOUS
  Administered 2014-11-27 (×2): via INTRAVENOUS

## 2014-11-26 MED ORDER — PEG-KCL-NACL-NASULF-NA ASC-C 100 G PO SOLR: 1.0000 | Freq: Once | ORAL | Status: DC

## 2014-11-26 MED ORDER — ALBUTEROL SULFATE HFA 108 (90 BASE) MCG/ACT IN AERS: 2.0000 | INHALATION_SPRAY | Freq: Four times a day (QID) | RESPIRATORY_TRACT | Status: DC | PRN

## 2014-11-26 MED ORDER — LEVOTHYROXINE SODIUM 125 MCG PO TABS
125.0000 ug | ORAL_TABLET | Freq: Every day | ORAL | Status: DC
  Administered 2014-11-26 – 2014-11-27 (×2): 125 ug via ORAL
  Filled 2014-11-26 (×3): qty 1

## 2014-11-26 MED ORDER — PEG-KCL-NACL-NASULF-NA ASC-C 100 G PO SOLR
0.5000 | Freq: Once | ORAL | Status: AC
  Administered 2014-11-27: 100 g via ORAL

## 2014-11-26 MED ORDER — ALBUTEROL SULFATE (2.5 MG/3ML) 0.083% IN NEBU
2.5000 mg | INHALATION_SOLUTION | Freq: Four times a day (QID) | RESPIRATORY_TRACT | Status: DC | PRN
Start: 2014-11-26 — End: 2014-11-27

## 2014-11-26 MED ORDER — BUDESONIDE-FORMOTEROL FUMARATE 160-4.5 MCG/ACT IN AERO
2.0000 | INHALATION_SPRAY | Freq: Two times a day (BID) | RESPIRATORY_TRACT | Status: DC
  Administered 2014-11-26 – 2014-11-27 (×2): 2 via RESPIRATORY_TRACT
  Filled 2014-11-26: qty 6

## 2014-11-26 NOTE — Progress Notes (Signed)
Gerre Couch NP called and asked to put a new order for CBC at 2200 o'clock. Patient complaining of taking the second dose of prep, because is making him having bloody stool. NP talked with patient on the phone and explained to him about the importance of getting the second dose of prep tomorrow morning; patient alert and oriented x 4; will continue to monitor.

## 2014-11-26 NOTE — Progress Notes (Signed)
Report given to RN 5 W room 20.

## 2014-11-26 NOTE — ED Notes (Signed)
Attempted to give report 

## 2014-11-26 NOTE — Progress Notes (Signed)
UR Completed.  [phone]  

## 2014-11-26 NOTE — Progress Notes (Signed)
Patient trasfered from 11M to 5W20 via wheelchair; alert and oriented x 4; no complaints of pain; IV saline locked in [place] and fluids running in RAC; skin intact. Orient patient to room and unit;  instructed how to use the call bell and  fall risk precautions. Will continue to monitor the patient.

## 2014-11-26 NOTE — Progress Notes (Signed)
Tele monitor showed frequent SVPBs - patient resting quietly in bed. He verbalized he is little bit anxious because of his procedure tomorrow morning. Will continue to monitor.

## 2014-11-26 NOTE — ED Notes (Signed)
Floor called and informed pt may not be coming up to floor.

## 2014-11-26 NOTE — ED Notes (Signed)
Critical care at [facility].

## 2014-11-26 NOTE — Consult Note (Signed)
[facility] Consult: 8:18 AM 11/26/2014  LOS: 1 day    Referring Provider: Dr Chase Caller  Primary Care Physician:  Walker Kehr, MD Primary Gastroenterologist:  Dr. Fuller Plan    Reason for Consultation:  LGIB, painless hematochezia.    HPI: Scott Oneal is a 74 y.o. male.  PMH CAD, chronic Plavix, celiac disease, hx colon polyps,asthma.   S/p colonoscopy with polypectomy x 2 on 11/18/14 by Dr Fuller Plan.  Resumed Plavix and 81 ASA 1/20.    Had normal, brown BMs after scope.  8 PM last night was the first of 7 or so large volume, painless, bloody stools.  Last large volume stool was 1AM.  Has couple of smaller clots this AM.  Did have some nausea, sweats, dizziness, weakness with BPs to 79/54 last night in ED that resolved with Trendelenberg and 1 litre NS.   Past Medical History  Diagnosis Date  . Coronary atherosclerosis of unspecified type of vessel, native or graft   . Old myocardial infarction   . Unspecified essential hypertension   . Pure hypercholesterolemia   . Long term (current) use of anticoagulants   . Other specified cardiac dysrhythmias(427.89)   . Esophageal reflux   . Other malaise and fatigue   . Other testicular hypofunction   . Personal history of colonic polyps   . Osteoporosis, unspecified   . Calculus of gallbladder without mention of cholecystitis   . Cholelithiasis   . Celiac disease   . Loss of weight   . Unspecified asthma(493.90)   . Diarrhea   . Unspecified hypothyroidism   . Gout, unspecified   . Benign neoplasm of colon   . Renal insufficiency     chronic  . Hyperparathyroidism   . Anxiety   . Arthritis   . Cataract     bilateral cateracts removed  . Glaucoma     Past Surgical History  Procedure Laterality Date  . Knee surgery      right  . Coronary stent  placement  2011    Cypher; in distal circumflex artery  . Cystoscopy  1998  . Shoulder surgery Left 9/14    Dr Shara Blazing  . Colonoscopy    . Upper gastrointestinal endoscopy    . Coronary angioplasty    . Vascular surgery    . Colon surgery    . Eye surgery      Prior to Admission medications   Medication Sig Start Date End Date Taking? Authorizing Provider  albuterol (PROAIR HFA) 108 (90 BASE) MCG/ACT inhaler Inhale 2 puffs into the lungs every 6 (six) hours as needed for wheezing or shortness of breath. 05/14/14  Yes Elsie Stain, MD  aspirin 81 MG tablet Take 81 mg by mouth daily.     Yes Historical Provider, MD  budesonide-formoterol (SYMBICORT) 160-4.5 MCG/ACT inhaler Inhale 2 puffs into the lungs 2 (two) times daily. 05/14/14  Yes Elsie Stain, MD  calcitRIOL (ROCALTROL) 0.25 MCG capsule Take 1 capsule (0.25 mcg total) by mouth daily. 04/15/14  Yes Aleksei Plotnikov V, MD  carvedilol (COREG)  25 MG tablet TAKE 1 TABLET BY MOUTH TWICE A DAY WITH A MEAL 10/08/14  Yes Aleksei Plotnikov V, MD  clopidogrel (PLAVIX) 75 MG tablet Take 1 tablet (75 mg total) by mouth daily. 04/17/14  Yes Aleksei Plotnikov V, MD  fish oil-omega-3 fatty acids 1000 MG capsule Take 1 g by mouth daily.     Yes Historical Provider, MD  levothyroxine (SYNTHROID, LEVOTHROID) 125 MCG tablet Take 1 tablet (125 mcg total) by mouth daily before breakfast. 02/04/14  Yes Aleksei Plotnikov V, MD  nitroGLYCERIN (NITROSTAT) 0.4 MG SL tablet Place 1 tablet (0.4 mg total) under the tongue every 5 (five) minutes as needed. 08/11/14  Yes Aleksei Plotnikov V, MD  olmesartan (BENICAR) 20 MG tablet Take 1 tablet (20 mg total) by mouth daily. 07/29/14  Yes Aleksei Plotnikov V, MD  sildenafil (VIAGRA) 100 MG tablet Take 100 mg by mouth daily as needed.     Yes Historical Provider, MD  simvastatin (ZOCOR) 10 MG tablet Hold while on diflucan then resume Patient taking differently: Take 10 mg by mouth daily at 6 PM.  05/14/14  Yes Elsie Stain, MD  testosterone cypionate (DEPOTESTOTERONE CYPIONATE) 200 MG/ML injection INJECT 2MLS EVERY 2 WEEKS 09/11/14  Yes Aleksei Plotnikov V, MD  triamcinolone cream (KENALOG) 0.1 % Apply 1 application topically 2 (two) times daily as needed (rash).  04/30/14  Yes Aleksei Plotnikov V, MD  clonazePAM (KLONOPIN) 0.25 MG disintegrating tablet Take 1 tablet (0.25 mg total) by mouth 2 (two) times daily as needed (anxiety). Patient not taking: Reported on 11/18/2014 11/27/13   Tyrone Apple Plotnikov V, MD  traMADol (ULTRAM) 50 MG tablet Take 1-2 tablets (50-100 mg total) by mouth 2 (two) times daily as needed. Patient not taking: Reported on 11/25/2014 06/10/14   Cassandria Anger, MD    Scheduled Meds: . antiseptic oral rinse  7 mL Mouth Rinse BID  . budesonide-formoterol  2 puff Inhalation BID  . levothyroxine  125 mcg Oral QAC breakfast   Infusions: . sodium chloride 125 mL/hr at 11/26/14 0042   PRN Meds: sodium chloride, albuterol   Allergies as of 11/25/2014 - Review Complete 11/25/2014  Allergen Reaction Noted  . Allantoin-pramoxine    . Metoprolol tartrate  05/12/2009    Family History  Problem Relation Age of Onset  . Hypertension    . Asthma Neg Hx   . Colon cancer Neg Hx   . Esophageal cancer Neg Hx   . Rectal cancer Neg Hx   . Stomach cancer Neg Hx   . Lymphoma Father   . Cancer Father     lymphoma  . Vision loss Maternal Grandmother     History   Social History  . Marital Status: Married    Spouse Name: Loris Winrow    Number of Children: 3  . Years of Education: N/A   Occupational History  . retired     Engineer, production   Social History Main Topics  . Smoking status: Never Smoker   . Smokeless tobacco: Never Used  . Alcohol Use: No  . Drug Use: No  . Sexual Activity: Yes   Other Topics Concern  . Not on file   Social History Narrative   Retired.   Regular Exercise-Yes; yoga & silver sneakers   Daily Caffeine Use.             REVIEW OF  SYSTEMS: Constitutional:  Stable weight.  active ENT:  No nose bleeds Pulm:  No  Sob or cough CV:  No palpitations, no LE edema.  GU:  No hematuria, no frequency GI:  Per HPI.  Sticks to gluten free diet Heme:  No hx anemia   Transfusions:  None ever Neuro:  No headaches, no peripheral tingling or numbness Derm:  No itching, no rash or sores.  Endocrine:  No sweats or chills.  No polyuria or dysuria Immunization:  Up to date flu shot.  Travel:  None beyond local counties in last few months.    PHYSICAL EXAM: Vital signs in last 24 hours: Filed Vitals:   11/26/14 0630  BP: 125/77  Pulse: 66  Temp:   Resp: 16   Wt Readings from Last 3 Encounters:  11/26/14 212 lb 15.4 oz (96.6 kg)  11/18/14 217 lb (98.431 kg)  09/11/14 217 lb 9.6 oz (98.703 kg)    General: pleasant, looks well.  comfortable Head:  No asymmetry or swelling  Eyes:  No icterus or conj pallor Ears:  Slight HOH  Nose:  No congestion or discharge Mouth:  Clear, moist.  Neck:  No JVD, no mass, no TMG Lungs:  Clear bil.  No cough or labored breathing.   Heart: RRR.  No MRG.  S1S2 audible Abdomen:  Soft, NT, ND.  Active BS.   Rectal: not performed   Musc/Skeltl: no joint swelling or redness Extremities:  No CCE  Neurologic:  Pleasant, alert/oriented x 3.  No tremor Skin:  No rash or sores Tattoos:  none Nodes:  No cervical adenopathy.    Psych:  Pleasant, alert, relaxed.   Intake/Output from previous day: 01/26 0701 - 01/27 0700 In: 1662.5 [I.V.:662.5; IV Piggyback:1000] Out: 1050 [Urine:650; Stool:400] Intake/Output this shift:    LAB RESULTS:  Recent Labs  11/25/14 2201 11/25/14 2348 11/26/14 0224  WBC 6.5  --  8.1  HGB 14.8 14.3 13.2  HCT 43.7 42.5 39.7  PLT 107*  --  95*   BMET Lab Results  Component Value Date   NA 139 11/26/2014   NA 138 11/25/2014   NA 141 08/07/2014   K 4.6 11/26/2014   K 4.9 11/25/2014   K 4.3 08/07/2014   CL 108 11/26/2014   CL 106 11/25/2014   CL 105  08/07/2014   CO2 26 11/26/2014   CO2 26 11/25/2014   CO2 30 08/07/2014   GLUCOSE 133* 11/26/2014   GLUCOSE 149* 11/25/2014   GLUCOSE 90 08/07/2014   BUN 14 11/26/2014   BUN 14 11/25/2014   BUN 18 08/07/2014   CREATININE 1.89* 11/26/2014   CREATININE 2.02* 11/25/2014   CREATININE 1.9* 08/07/2014   CALCIUM 7.9* 11/26/2014   CALCIUM 8.9 11/25/2014   CALCIUM 9.0 08/07/2014   LFT  Recent Labs  11/25/14 2201  PROT 5.3*  ALBUMIN 3.5  AST 28  ALT 18  ALKPHOS 46  BILITOT 0.8   PT/INR No results found for: INR, PROTIME  Drugs of Abuse  No results found for: LABOPIA, COCAINSCRNUR, LABBENZ, AMPHETMU, THCU, LABBARB   RADIOLOGY STUDIES: No results found.  ENDOSCOPIC STUDIES: 11/18/14  Colonoscopy  For hx adenomatous polyps  ENDOSCOPIC IMPRESSION: 1. Sessile polyp in the ascending colon; polypectomy performed with a cold snare 2. Sessile polyp in the transverse colon; polypectomy performed using snare cautery 3. Grade I internal hemorrhoids RECOMMENDATIONS: 1. Await pathology results 2. Repeat Colonoscopy in 3 years. 3. Resume Plavix and ASA tomorrow Pathology: Transverse: tubular adenoma with focus of HGD.  Ascending Tubular adenoma, no HGD.   07/2009  Colonoscopy follow-up of polyp, tubulovillous adenoma, 02/2001 ENDOSCOPIC  IMPRESSION: 1) 5 mm sessile polyp in the ascending colon Pathology: 1. COLON, BIOPSY: BENIGN COLONIC MUCOSA. NO SIGNIFICANT INFLAMMATION OR OTHER ABNORMALITIES IDENTIFIED. 2. descending COLON, POLYP(S): ADENOMATOUS POLYP. NO HIGH GRADE DYSPLASIA OR INVASIVE MALIGNANCY IDENTIFIED.  03/2008  EGD Duodenitis without Hemorrhage. DUODENUM, BIOPSY: SUBTOTAL VILLOUS ATROPHY WITH CRYPT HYPERPLASIA AND INCREASED INTRAEPITHELIAL LYMPHOCYTES. SEE COMMENT.  2002 and 2007 Colonoscopies  Both with polypectomies.    IMPRESSION:   *  Post polypectomy bleed.  Hgb has dropped but not in need of transfusion.  Long standing hx of recurrent  colon polyps. The transverse polyp removed 1/19 shows focal HGD.  *  Thrombocytopenia. Not new.  Present since 2008.   *  Chronic Plavix for hx CAD/2005 cardiac stents.  *  Celiac disease     PLAN:     *  Observe today off ASA and Plavix.  Follow Hgb. .  Prep starting tonite with possible colonoscopy tomorrow.  If bleeding stops, may not pursue the colonoscopy but will prep just in case. Pt aware an agreeable   Azucena Freed  11/26/2014, 8:18 AM Pager: [phone]     ________________________________________________________________________  Velora Heckler GI MD note:  I personally examined the patient, reviewed the data and agree with the assessment and plan described above.  Likely bleeding from the polyp site which required cautery.  Will plan on prepping tonight, repeat colonoscopy tomorrow.  Hb 13 and he's HD stable.  Hold ASA, plavix for now. Will get coags.   Owens Loffler, MD [facility] [place] [facility] Pager [phone]

## 2014-11-26 NOTE — ED Notes (Signed)
Flow called to inquire about pt's bed request.  

## 2014-11-26 NOTE — Progress Notes (Addendum)
PULMONARY / CRITICAL CARE MEDICINE   Name: Scott Oneal MRN: [id number] DOB: 1941/10/18    ADMISSION DATE:  11/25/2014 CONSULTATION DATE:  11/26/2014  REFERRING MD :  EDP  CHIEF COMPLAINT:  LGIB  INITIAL PRESENTATION:  74 y.o. M brought to [facility] ED 1/26 with LGIB.  In ED, had 3 more episodes of bloody BM's and became hypotensive; hence PCCM called for admission to ICU.    STUDIES:  None  SIGNIFICANT EVENTS: 1/19 - colonoscopy by Dr. Jacinto Halim with polypectomy x 2 1/27 - admitted to [facility] ICU with LGIB  SUBJECTIVE:  Patient states he feels much better than when he came in. Denies any abdominal pain, N/V. He has not had any additional bowel movements since the ED. Hbg stable.   VITAL SIGNS: Temp:  [97.6 F (36.4 C)-98.6 F (37 C)] 97.6 F (36.4 C) (01/27 0436) Pulse Rate:  [27-86] 64 (01/27 0600) Resp:  [13-28] 14 (01/27 0600) BP: (79-163)/(53-94) 125/63 mmHg (01/27 0600) SpO2:  [95 %-100 %] 98 % (01/27 0600) Weight:  [96.6 kg (212 lb 15.4 oz)-97.523 kg (215 lb)] 96.6 kg (212 lb 15.4 oz) (01/27 0205) HEMODYNAMICS:   VENTILATOR SETTINGS:   INTAKE / OUTPUT: Intake/Output      01/26 0701 - 01/27 0700   I.V. (mL/kg) 662.5 (6.9)   IV Piggyback 1000   Total Intake(mL/kg) 1662.5 (17.2)   Urine (mL/kg/hr) 300   Stool 400   Total Output 700   Net +962.5         PHYSICAL EXAMINATION: General: pleasant, sitting up in bed, NAD Neuro: alert and oriented x 3, no focal deficits HEENT: [place]/AT, EOMI, PERRL, sclera anicteric Cardiovascular: RRR, no M/R/G.  Lungs: CTA bilaterally, breaths non-labored Abdomen: BS+, soft, non-tender, non-distended Musculoskeletal: No gross deformities, no edema  Skin: warm, no rashes  LABS:  CBC  Recent Labs Lab 11/25/14 2201 11/25/14 2348 11/26/14 0224  WBC 6.5  --  8.1  HGB 14.8 14.3 13.2  HCT 43.7 42.5 39.7  PLT 107*  --  95*   Coag's No results for input(s): APTT, INR in the last 168 hours. BMET  Recent Labs Lab 11/25/14 2201  11/26/14 0224  NA 138 139  K 4.9 4.6  CL 106 108  CO2 26 26  BUN 14 14  CREATININE 2.02* 1.89*  GLUCOSE 149* 133*   Electrolytes  Recent Labs Lab 11/25/14 2201 11/26/14 0224  CALCIUM 8.9 7.9*  MG  --  1.7  PHOS  --  2.5   Sepsis Markers No results for input(s): LATICACIDVEN, PROCALCITON, O2SATVEN in the last 168 hours. ABG No results for input(s): PHART, PCO2ART, PO2ART in the last 168 hours. Liver Enzymes  Recent Labs Lab 11/25/14 2201  AST 28  ALT 18  ALKPHOS 46  BILITOT 0.8  ALBUMIN 3.5   Cardiac Enzymes  Recent Labs Lab 11/26/14 0224  TROPONINI <0.03   Glucose  Recent Labs Lab 11/26/14 0204  GLUCAP 125*    Imaging No results found.  ASSESSMENT / PLAN:  GASTROINTESTINAL A:   Lower GI Bleed Nutrition P:   GI consulted, appreciate recommendations NPO except meds  HEMATOLOGIC A:   At risk acute blood loss anemia - H/H currently stable VTE Prophylaxis P:  SCD's only CBC Q8H  PULMONARY A: Hx Asthma - sees Dr. Joya Gaskins P:   Continue outpatient budesonide, albuterol PRN  CARDIOVASCULAR A:  Transient hypotension - resolved Hx cardiac dysrhythmia, CAD s/p stenting, prior MI, HLD P:  Goal SBP > 90. Check troponin> negative  Hold outpatient aspirin, plavix, carvedilol, nitro, olmesartan, simvastatin  RENAL A:   Renal insufficiency - baseline SCr ~ 2.0 P:   NS @ 125 Bmet in AM  INFECTIOUS A:   No indication of infection P:   Monitor clinically  ENDOCRINE A:   Hypothyroidism Hyperparathyroidism P:   Continue home synthroid  NEUROLOGIC A:   Hx anxiety P:   Hold outpatient clonazepam PRN- pt only takes "once in awhile" to help with sleep   Family updated: Wife and son at bedside.  Interdisciplinary Family Meeting v Palliative Care Meeting:  Due by: N/A.   Albin Felling, MD Internal Medicine Resident, PGY-1  Attending:  I have seen and examined the patient with nurse practitioner/resident and agree with the note  above.   Mr. Gorum is feeling much better today  Lungs clear Ab soft Hgb stable  Plan: Move to floor, [facility] service May need colonoscopy in AM  Roselie Awkward, MD [place] [facility] Pager: [phone] Cell: [phone] If no response, call [phone]

## 2014-11-26 NOTE — H&P (Signed)
PULMONARY / CRITICAL CARE MEDICINE   Name: Scott Oneal MRN: [id number] DOB: 06-18-1941    ADMISSION DATE:  11/25/2014 CONSULTATION DATE:  11/26/2014  REFERRING MD :  EDP  CHIEF COMPLAINT:  LGIB  INITIAL PRESENTATION:  74 y.o. M brought to [facility] ED 1/26 with LGIB.  In ED, had 3 more episodes of bloody BM's and became hypotensive; hence PCCM called for admission to ICU.     STUDIES:  None  SIGNIFICANT EVENTS: 1/19 - colonoscopy by Dr. Jacinto Halim with polypectomy x 2 1/27 - admitted to [facility] ICU with LGIB   HISTORY OF PRESENT ILLNESS: Scott Oneal is a 74 y.o. M with PMH as outlined below.  He presented to [facility] ED 1/26 after he had 2 bloody bowel movements earlier at his home.  He states that he had a colonoscopy on 1/19 (Dr. Jacinto Halim) and had 2 polyps removed (has hx of polyps, all benign).  He had not had any problems following procedure until tonight.  After 2nd bloody BM, he decided to come to ED for further eval.  While in ED, he had an additional 3 bloody BM's.  Following 3rd one, he became nauseous, diaphoretic, lightheaded, and weak.  BP was noted to be 79/54.  He was given 1L NS and placed in trendelenburg position.  Repeat labs were drawn and PCCM was consulted for admission.  Hemoglobin on arrival was 14.8, down to 14.3 on repeat.  Following 1L NS, pt hemodynamically stable with BP of 126/68.  PAST MEDICAL HISTORY :   has a past medical history of Coronary atherosclerosis of unspecified type of vessel, native or graft; Old myocardial infarction; Unspecified essential hypertension; Pure hypercholesterolemia; Long term (current) use of anticoagulants; Other specified cardiac dysrhythmias(427.89); Esophageal reflux; Other malaise and fatigue; Other testicular hypofunction; Personal history of colonic polyps; Osteoporosis, unspecified; Calculus of gallbladder without mention of cholecystitis; Cholelithiasis; Celiac disease; Loss of weight; Unspecified asthma(493.90); Diarrhea; Unspecified  hypothyroidism; Gout, unspecified; Benign neoplasm of colon; Renal insufficiency; Hyperparathyroidism; Anxiety; Arthritis; Cataract; and Glaucoma.  has past surgical history that includes Knee surgery; Coronary stent placement (2011); Cystoscopy (1998); Shoulder surgery (Left, 9/14); Colonoscopy; and Upper gastrointestinal endoscopy. Prior to Admission medications   Medication Sig Start Date End Date Taking? Authorizing Provider  albuterol (PROAIR HFA) 108 (90 BASE) MCG/ACT inhaler Inhale 2 puffs into the lungs every 6 (six) hours as needed for wheezing or shortness of breath. 05/14/14  Yes Elsie Stain, MD  aspirin 81 MG tablet Take 81 mg by mouth daily.     Yes Historical Provider, MD  budesonide-formoterol (SYMBICORT) 160-4.5 MCG/ACT inhaler Inhale 2 puffs into the lungs 2 (two) times daily. 05/14/14  Yes Elsie Stain, MD  calcitRIOL (ROCALTROL) 0.25 MCG capsule Take 1 capsule (0.25 mcg total) by mouth daily. 04/15/14  Yes Aleksei Plotnikov V, MD  carvedilol (COREG) 25 MG tablet TAKE 1 TABLET BY MOUTH TWICE A DAY WITH A MEAL 10/08/14  Yes Aleksei Plotnikov V, MD  clopidogrel (PLAVIX) 75 MG tablet Take 1 tablet (75 mg total) by mouth daily. 04/17/14  Yes Aleksei Plotnikov V, MD  fish oil-omega-3 fatty acids 1000 MG capsule Take 1 g by mouth daily.     Yes Historical Provider, MD  levothyroxine (SYNTHROID, LEVOTHROID) 125 MCG tablet Take 1 tablet (125 mcg total) by mouth daily before breakfast. 02/04/14  Yes Aleksei Plotnikov V, MD  nitroGLYCERIN (NITROSTAT) 0.4 MG SL tablet Place 1 tablet (0.4 mg total) under the tongue every 5 (five) minutes as needed. 08/11/14  Yes Aleksei Plotnikov V, MD  olmesartan (BENICAR) 20 MG tablet Take 1 tablet (20 mg total) by mouth daily. 07/29/14  Yes Aleksei Plotnikov V, MD  sildenafil (VIAGRA) 100 MG tablet Take 100 mg by mouth daily as needed.     Yes Historical Provider, MD  simvastatin (ZOCOR) 10 MG tablet Hold while on diflucan then resume Patient taking  differently: Take 10 mg by mouth daily at 6 PM.  05/14/14  Yes Elsie Stain, MD  testosterone cypionate (DEPOTESTOTERONE CYPIONATE) 200 MG/ML injection INJECT 2MLS EVERY 2 WEEKS 09/11/14  Yes Aleksei Plotnikov V, MD  triamcinolone cream (KENALOG) 0.1 % Apply 1 application topically 2 (two) times daily as needed (rash).  04/30/14  Yes Aleksei Plotnikov V, MD  clonazePAM (KLONOPIN) 0.25 MG disintegrating tablet Take 1 tablet (0.25 mg total) by mouth 2 (two) times daily as needed (anxiety). Patient not taking: Reported on 11/18/2014 11/27/13   Tyrone Apple Plotnikov V, MD  traMADol (ULTRAM) 50 MG tablet Take 1-2 tablets (50-100 mg total) by mouth 2 (two) times daily as needed. Patient not taking: Reported on 11/25/2014 06/10/14   Cassandria Anger, MD   Allergies  Allergen Reactions  . Allantoin-Pramoxine     Pt does't remember reaction  . Metoprolol Tartrate     REACTION: fatigue    FAMILY HISTORY:  Family History  Problem Relation Age of Onset  . Hypertension    . Asthma Neg Hx   . Colon cancer Neg Hx   . Esophageal cancer Neg Hx   . Rectal cancer Neg Hx   . Stomach cancer Neg Hx   . Lymphoma Father   . Cancer Father     lymphoma  . Vision loss Maternal Grandmother     SOCIAL HISTORY:  reports that he has never smoked. He has never used smokeless tobacco. He reports that he does not drink alcohol or use illicit drugs.  REVIEW OF SYSTEMS:   All negative; except for those that are bolded, which indicate positives.  Constitutional: weight loss, weight gain, night sweats, fevers, chills, fatigue, weakness.  HEENT: headaches, sore throat, sneezing, nasal congestion, post nasal drip, difficulty swallowing, tooth/dental problems, visual complaints, visual changes, ear aches. Neuro: difficulty with speech, weakness, numbness, ataxia. CV:  chest pain, orthopnea, PND, swelling in lower extremities, dizziness, palpitations, syncope.  Resp: cough, hemoptysis, dyspnea, wheezing. GI  heartburn,  indigestion, abdominal pain, nausea, vomiting, diarrhea, constipation, change in bowel habits, loss of appetite, hematemesis, melena, hematochezia.  GU: dysuria, change in color of urine, urgency or frequency, flank pain, hematuria. MSK: joint pain or swelling, decreased range of motion. Psych: change in mood or affect, depression, anxiety, suicidal ideations, homicidal ideations. Skin: rash, itching, bruising.   SUBJECTIVE:  Feels "much better" after 1L NS bolus.  Denies chest pain, SOB, N/V, abdominal pain.  Vitals stable.  VITAL SIGNS: Temp:  [98.2 F (36.8 C)] 98.2 F (36.8 C) (01/26 2140) Pulse Rate:  [27-86] 63 (01/27 0015) Resp:  [13-28] 18 (01/27 0015) BP: (79-163)/(54-94) 126/68 mmHg (01/27 0015) SpO2:  [95 %-100 %] 97 % (01/27 0015) Weight:  [97.523 kg (215 lb)] 97.523 kg (215 lb) (01/26 2140) HEMODYNAMICS:   VENTILATOR SETTINGS:   INTAKE / OUTPUT: Intake/Output      01/26 0701 - 01/27 0700   IV Piggyback 1000   Total Intake(mL/kg) 1000 (10.3)   Stool 400   Total Output 400   Net +600         PHYSICAL EXAMINATION: General: WDWN male, in NAD. Neuro:  A&O x 3, non-focal.  HEENT: [place]/AT. PERRL, sclerae anicteric. Cardiovascular: RRR, no M/R/G.  Lungs: Respirations even and unlabored.  CTA bilaterally, No W/R/R.  Abdomen: BS x 4, soft, NT/ND.  Musculoskeletal: No gross deformities, no edema.  Skin: Intact, warm, no rashes.  LABS:  CBC  Recent Labs Lab 11/25/14 2201 11/25/14 2348  WBC 6.5  --   HGB 14.8 14.3  HCT 43.7 42.5  PLT 107*  --    Coag's No results for input(s): APTT, INR in the last 168 hours. BMET  Recent Labs Lab 11/25/14 2201  NA 138  K 4.9  CL 106  CO2 26  BUN 14  CREATININE 2.02*  GLUCOSE 149*   Electrolytes  Recent Labs Lab 11/25/14 2201  CALCIUM 8.9   Sepsis Markers No results for input(s): LATICACIDVEN, PROCALCITON, O2SATVEN in the last 168 hours. ABG No results for input(s): PHART, PCO2ART, PO2ART in the last 168  hours. Liver Enzymes  Recent Labs Lab 11/25/14 2201  AST 28  ALT 18  ALKPHOS 46  BILITOT 0.8  ALBUMIN 3.5   Cardiac Enzymes No results for input(s): TROPONINI, PROBNP in the last 168 hours. Glucose No results for input(s): GLUCAP in the last 168 hours.  Imaging No results found.  ASSESSMENT / PLAN:  GASTROINTESTINAL A:   Lower GI Bleed Nutrition P:   GI consulted, will see pt in AM unless has significant bleeding overnight. NPO except meds.  HEMATOLOGIC A:   At risk acute blood loss anemia - H/H currently stable VTE Prophylaxis P:  SCD's only. CBC in AM.  PULMONARY A: Hx Asthma - sees Dr. Joya Gaskins P:   Continue outpatient budesonide, albuterol PRN.  CARDIOVASCULAR A:  Transient hypotension - resolved Hx cardiac dysrhythmia, CAD s/p stenting, prior MI, HLD P:  Goal SBP > 90. Check troponin. Hold outpatient aspirin, plavix, carvedilol, nitro, olmesartan, simvastatin.  RENAL A:   Renal insufficiency - baseline SCr ~ 2.0 P:   NS @ 125. BMP in AM.  INFECTIOUS A:   No indication of infection P:   Monitor clinically.  ENDOCRINE A:   Hypothyroidism Hyperparathyroidism P:   Continue outpatient synthroid.  NEUROLOGIC A:   Hx anxiety P:   Hold outpatient clonazepam PRN (? If pt still takes as outpatient).   Family updated: Wife and son at bedside.  Interdisciplinary Family Meeting v Palliative Care Meeting:  Due by: N/A.   Montey Hora, [facility] Pgr: [phone]  or [phone] 11/26/2014, 12:31 AM

## 2014-11-26 NOTE — ED Notes (Signed)
Flow and floor called and informed pt's bed assignment is changing.

## 2014-11-27 ENCOUNTER — Encounter (HOSPITAL_COMMUNITY)
Admission: EM | Disposition: A | Discharge status: Home / Self Care | Payer: Self-pay | Source: Home / Self Care | Attending: Internal Medicine

## 2014-11-27 ENCOUNTER — Encounter (HOSPITAL_COMMUNITY): Payer: Self-pay | Admitting: *Deleted

## 2014-11-27 ENCOUNTER — Inpatient Hospital Stay (HOSPITAL_COMMUNITY): Payer: Medicare HMO | Admitting: Anesthesiology

## 2014-11-27 DIAGNOSIS — K625 Hemorrhage of anus and rectum: Secondary | ICD-10-CM

## 2014-11-27 HISTORY — PX: COLONOSCOPY WITH PROPOFOL: SHX5780

## 2014-11-27 LAB — CBC
HCT: 36.8 % — ABNORMAL LOW (ref 39.0–52.0)
HEMATOCRIT: 38 % — AB (ref 39.0–52.0)
HEMOGLOBIN: 12.5 g/dL — AB (ref 13.0–17.0)
Hemoglobin: 12.4 g/dL — ABNORMAL LOW (ref 13.0–17.0)
MCH: 30.6 pg (ref 26.0–34.0)
MCH: 30.7 pg (ref 26.0–34.0)
MCHC: 32.9 g/dL (ref 30.0–36.0)
MCHC: 33.7 g/dL (ref 30.0–36.0)
MCV: 93.1 fL (ref 78.0–100.0)
MCV: 91.1 fL (ref 78.0–100.0)
Platelets: 88 10*3/uL — ABNORMAL LOW (ref 150–400)
Platelets: 95 10*3/uL — ABNORMAL LOW (ref 150–400)
RBC: 4.08 MIL/uL — ABNORMAL LOW (ref 4.22–5.81)
RBC: 4.04 MIL/uL — ABNORMAL LOW (ref 4.22–5.81)
RDW: 14.8 % (ref 11.5–15.5)
RDW: 14.6 % (ref 11.5–15.5)
WBC: 4.7 10*3/uL (ref 4.0–10.5)
WBC: 5 10*3/uL (ref 4.0–10.5)

## 2014-11-27 SURGERY — COLONOSCOPY WITH PROPOFOL
Anesthesia: Monitor Anesthesia Care

## 2014-11-27 MED ORDER — LACTATED RINGERS IV SOLN
INTRAVENOUS | Status: DC
  Administered 2014-11-27: 11:00:00 via INTRAVENOUS

## 2014-11-27 MED ORDER — LACTATED RINGERS IV SOLN
INTRAVENOUS | Status: DC | PRN
  Administered 2014-11-27: 12:00:00 via INTRAVENOUS

## 2014-11-27 MED ORDER — PROPOFOL INFUSION 10 MG/ML OPTIME
INTRAVENOUS | Status: DC | PRN
  Administered 2014-11-27: 75 ug/kg/min via INTRAVENOUS

## 2014-11-27 MED ORDER — CLOPIDOGREL BISULFATE 75 MG PO TABS: 75.0000 mg | ORAL_TABLET | Freq: Every day | ORAL | Status: DC

## 2014-11-27 MED ORDER — SODIUM CHLORIDE 0.9 % IV SOLN: INTRAVENOUS | Status: DC

## 2014-11-27 MED ORDER — PROMETHAZINE HCL 25 MG/ML IJ SOLN: 6.2500 mg | INTRAMUSCULAR | Status: DC | PRN

## 2014-11-27 MED ORDER — ASPIRIN 81 MG PO TABS: 81.0000 mg | ORAL_TABLET | Freq: Every day | ORAL | Status: DC

## 2014-11-27 MED ORDER — PROPOFOL 10 MG/ML IV BOLUS
INTRAVENOUS | Status: DC | PRN
  Administered 2014-11-27 (×3): 20 mg via INTRAVENOUS

## 2014-11-27 NOTE — Op Note (Signed)
[facility] [place], [address]   COLONOSCOPY PROCEDURE REPORT  PATIENT: Scott Oneal, Scott Oneal  MR#: [id number] BIRTHDATE: [birth year] , 41  yrs. old GENDER: male ENDOSCOPIST: Milus Banister, MD PROCEDURE DATE:  11/27/2014 PROCEDURE:   Colonoscopy with control of bleeding First Screening Colonoscopy - Avg.  risk and is 50 yrs.  old or older - No.  Prior Negative Screening - Now for repeat screening. N/A  History of Adenoma - Now for follow-up colonoscopy & has been > or = to 3 yrs.  N/A  Polyps Removed Today? No.  Recommend repeat exam, <10 yrs? No. ASA CLASS:   Class III INDICATIONS:post polypectomy bleeding. MEDICATIONS: Monitored anesthesia care  DESCRIPTION OF PROCEDURE:   After the risks benefits and alternatives of the procedure were thoroughly explained, informed consent was obtained.  The digital rectal exam revealed no abnormalities of the rectum.   The Pentax Adult Colon [id number] endoscope was introduced through the anus and advanced to the cecum, which was identified by ileocecal valve. No adverse events experienced.   The quality of the prep was good.  The instrument was then slowly withdrawn as the colon was fully examined.  COLON FINDINGS: There was no blood in the colon.  The site of transverse colon polypectomy was located clearly. There was a 5-71m ulcer with protuberant red spot (presumed visible vessel without active bleeding). The site was treated with placement of 3 endoclips in good position. There was no precipitation of bleeding during treatment. The examination was otherwise normal. Retroflexed views revealed no abnormalities. The time to cecum=3 minutes 00 seconds.  Withdrawal time=10 minutes 00 seconds.  The scope was withdrawn and the procedure completed. COMPLICATIONS: There were no immediate complications.  ENDOSCOPIC IMPRESSION: There was ulceration and small visible vessel at the site of transverse polypectomy.  This was treated with endoclip placement. No recent or old blood in the colon.  RECOMMENDATIONS: Follow clinically.  OK to d/c home later this afternoon if no recurrent bleeding. Can restart ASA tomorrow, plavix in 5 days.  eSigned:  DMilus Banister MD 11/27/2014 12:23 PM   cc: MLucio Edward MD

## 2014-11-27 NOTE — Clinical Documentation Improvement (Signed)
Presents with post procedural hemorrhage and hema following colonoscopy (GI Consult).   Patient in ED became hypotensive (79/54), tachycardic (90's), occasionally tachypneic (28)   Placed in Trendelenburg  IL NS fluid bolus given  H&H dropped from 14.8 to 11.8; transfused 1 unit prbc's  Please provide a diagnosis from the clinical indicator's listed above and document findings in next progress note and include in discharge summary. If shock ruled in, please clarify the type of as well.  Cardiogenic Shock Hypovolemic Shock Hemorrhagic Shock Other Condition  Thank You, Zoila Shutter ,RN Clinical Documentation Specialist:  [phone]  [facility] Information Management

## 2014-11-27 NOTE — Anesthesia Procedure Notes (Signed)
Procedure Name: MAC Date/Time: 11/27/2014 11:55 AM Performed by: Suzy Bouchard Pre-anesthesia Checklist: Patient identified, Emergency Drugs available, Suction available and Patient being monitored Patient Re-evaluated:Patient Re-evaluated prior to inductionOxygen Delivery Method: Simple face mask

## 2014-11-27 NOTE — Discharge Instructions (Signed)
Rectal Bleeding Rectal bleeding is when blood passes out of the anus. It is usually a sign that something is wrong. It may not be serious, but it should always be evaluated. Rectal bleeding may present as bright red blood or extremely dark stools. The color may range from dark red or maroon to black (like tar). It is important that the cause of rectal bleeding be identified so treatment can be started and the problem corrected. CAUSES   Hemorrhoids. These are enlarged (dilated) blood vessels or veins in the anal or rectal area.  Fistulas. Theseare abnormal, burrowing channels that usually run from inside the rectum to the skin around the anus. They can bleed.  Anal fissures. This is a tear in the tissue of the anus. Bleeding occurs with bowel movements.  Diverticulosis. This is a condition in which pockets or sacs project from the bowel wall. Occasionally, the sacs can bleed.  Diverticulitis. Thisis an infection involving diverticulosis of the colon.  Proctitis and colitis. These are conditions in which the rectum, colon, or both, can become inflamed and pitted (ulcerated).  Polyps and cancer. Polyps are non-cancerous (benign) growths in the colon that may bleed. Certain types of polyps turn into cancer.  Protrusion of the rectum. Part of the rectum can project from the anus and bleed.  Certain medicines.  Intestinal infections.  Blood vessel abnormalities. HOME CARE INSTRUCTIONS  Eat a high-fiber diet to keep your stool soft.  Limit activity.  Drink enough fluids to keep your urine clear or pale yellow.  Warm baths may be useful to soothe rectal pain.  Follow up with your caregiver as directed. SEEK IMMEDIATE MEDICAL CARE IF:  You develop increased bleeding.  You have black or dark red stools.  You vomit blood or material that looks like coffee grounds.  You have abdominal pain or tenderness.  You have a fever.  You feel weak, nauseous, or you faint.  You have  severe rectal pain or you are unable to have a bowel movement. MAKE SURE YOU:  Understand these instructions.  Will watch your condition.  Will get help right away if you are not doing well or get worse. Document Released: 04/08/2002 Document Revised: 01/09/2012 Document Reviewed: 04/03/2011 [facility] Patient Information 2015 [place], [place]. This information is not intended to replace advice given to you by your health care provider. Make sure you discuss any questions you have with your health care provider.

## 2014-11-27 NOTE — Addendum Note (Signed)
Addendum  created 11/27/14 1334 by Suzy Bouchard, CRNA   Modules edited: Anesthesia Medication Administration

## 2014-11-27 NOTE — Interval H&P Note (Signed)
History and Physical Interval Note:  11/27/2014 11:07 AM  Scott Oneal  has presented today for surgery, with the diagnosis of lower gi bleeding  The various methods of treatment have been discussed with the patient and family. After consideration of risks, benefits and other options for treatment, the patient has consented to  Procedure(s): COLONOSCOPY WITH PROPOFOL (N/A) as a surgical intervention .  The patient's history has been reviewed, patient examined, no change in status, stable for surgery.  I have reviewed the patient's chart and labs.  Questions were answered to the patient's satisfaction.     Milus Banister

## 2014-11-27 NOTE — Transfer of Care (Signed)
Immediate Anesthesia Transfer of Care Note  Patient: Scott Oneal  Procedure(s) Performed: Procedure(s): COLONOSCOPY WITH PROPOFOL (N/A)  Patient Location: Endoscopy Unit  Anesthesia Type:MAC  Level of Consciousness: awake and alert   Airway & Oxygen Therapy: Patient Spontanous Breathing  Post-op Assessment: Report given to PACU RN, Post -op Vital signs reviewed and stable and Patient moving all extremities  Post vital signs: Reviewed and stable  Last Vitals:  Filed Vitals:   11/27/14 1223  BP: 137/78  Pulse: 81  Temp: 37 C  Resp: 14    Complications: No apparent anesthesia complications

## 2014-11-27 NOTE — Progress Notes (Signed)
[facility] PROGRESS NOTE  Scott Oneal DEY:[id number] DOB: Feb 15, 1941 DOA: 11/25/2014  PCP: Walker Kehr, MD  Brief HPI: 74 year old Caucasian male brought into the emergency department due to rectal bleeding. He was hypotensive. He reports a history of for colonoscopy recently with polypectomy. He was admitted for further management.  Past medical history:  Past Medical History  Diagnosis Date  . Coronary atherosclerosis of unspecified type of vessel, native or graft   . Old myocardial infarction   . Unspecified essential hypertension   . Pure hypercholesterolemia   . Long term (current) use of anticoagulants   . Other specified cardiac dysrhythmias(427.89)   . Esophageal reflux   . Other malaise and fatigue   . Other testicular hypofunction   . Personal history of colonic polyps   . Osteoporosis, unspecified   . Calculus of gallbladder without mention of cholecystitis   . Cholelithiasis   . Celiac disease   . Loss of weight   . Unspecified asthma(493.90)   . Diarrhea   . Unspecified hypothyroidism   . Gout, unspecified   . Benign neoplasm of colon   . Renal insufficiency     chronic  . Hyperparathyroidism   . Anxiety   . Arthritis   . Cataract     bilateral cateracts removed  . Glaucoma     Consultants: Gastroenterology  Procedures:  Colonoscopy on 1/28 ENDOSCOPIC IMPRESSION: There was ulceration and small visible vessel at the site of transverse polypectomy. This was treated with endoclip placement. No recent or old blood in the colon.  Antibiotics: None  Subjective: Patient feels well. Had 8 bowel movements last night. 5 of them had blood in the last 3 did not have any blood in them. Denies any abdominal pain, nausea or vomiting.  Objective: Vital Signs  Filed Vitals:   11/26/14 2143 11/26/14 2230 11/27/14 0517 11/27/14 0804  BP:  126/71 123/61   Pulse:  72 66   Temp:  98.6 F (37 C) 97.9 F (36.6 C)   TempSrc:  Oral Oral   Resp:  16  18   Height:      Weight:   101.1 kg (222 lb 14.2 oz)   SpO2: 98% 96% 97% 98%    Intake/Output Summary (Last 24 hours) at 11/27/14 1027 Last data filed at 11/27/14 0858  Gross per 24 hour  Intake 1304.58 ml  Output    300 ml  Net 1004.58 ml   Filed Weights   11/25/14 2140 11/26/14 0205 11/27/14 0517  Weight: 97.523 kg (215 lb) 96.6 kg (212 lb 15.4 oz) 101.1 kg (222 lb 14.2 oz)    General appearance: alert, cooperative, appears stated age and no distress Resp: clear to auscultation bilaterally Cardio: regular rate and rhythm, S1, S2 normal, no murmur, click, rub or gallop GI: soft, non-tender; bowel sounds normal; no masses,  no organomegaly Extremities: Minimal edema noted bilateral lower extremities. Neurologic: Alert and oriented 3. No focal neurological deficits noted.   Lab Results:  Basic Metabolic Panel:  Recent Labs Lab 11/25/14 2201 11/26/14 0224  NA 138 139  K 4.9 4.6  CL 106 108  CO2 26 26  GLUCOSE 149* 133*  BUN 14 14  CREATININE 2.02* 1.89*  CALCIUM 8.9 7.9*  MG  --  1.7  PHOS  --  2.5   Liver Function Tests:  Recent Labs Lab 11/25/14 2201  AST 28  ALT 18  ALKPHOS 46  BILITOT 0.8  PROT 5.3*  ALBUMIN 3.5   CBC:  Recent Labs Lab 11/25/14 2201 11/25/14 2348 11/26/14 0224 11/26/14 1506 11/26/14 2200 11/27/14 0832  WBC 6.5  --  8.1 5.8 5.2 4.7  HGB 14.8 14.3 13.2 12.5* 11.8* 12.5*  HCT 43.7 42.5 39.7 37.5* 35.6* 38.0*  MCV 90.5  --  92.1 91.5 93.0 93.1  PLT 107*  --  95* 87* 89* 88*   Cardiac Enzymes:  Recent Labs Lab 11/26/14 0224  TROPONINI <0.03   CBG:  Recent Labs Lab 11/26/14 0204  GLUCAP 125*    Recent Results (from the past 240 hour(s))  MRSA PCR Screening     Status: None   Collection Time: 11/26/14  2:04 AM  Result Value Ref Range Status   MRSA by PCR NEGATIVE NEGATIVE Final    Comment:        The GeneXpert MRSA Assay (FDA approved for NASAL specimens only), is one component of a comprehensive MRSA  colonization surveillance program. It is not intended to diagnose MRSA infection nor to guide or monitor treatment for MRSA infections.       Studies/Results: No results found.  Medications:  Scheduled: . antiseptic oral rinse  7 mL Mouth Rinse BID  . budesonide-formoterol  2 puff Inhalation BID  . levothyroxine  125 mcg Oral QAC breakfast   Continuous: . sodium chloride 50 mL/hr at 11/27/14 1031   HDI:[id number]  Assessment/Plan:  Active Problems:   Rectal bleeding   Lower GI bleed    Lower GI bleed with transient hemorrhagic shock Appears to be post polypectomy. GI has seen and patient underwent colonoscopy earlier today. Possible site of bleeding was identified which was clipped. Continue to monitor for recurrence. Blood pressure has stabilized.  Acute blood loss anemia Hemoglobin has remained stable. No indications for transfusion. Continue to monitor counts  History of coronary artery disease status post stent placement in 2005 Antiplatelet agents on hold. Per GI, Aspirin can be resumed tomorrow and Plavix in 5 days. Continue beta blockers and ARB.  History of asthma Not an active issue currently. Continue with maintenance medications.  CKD stage III Renal function at baseline.  History of hypothyroidism Continue with Synthroid  DVT Prophylaxis: SCDs    Code Status: Full code  Family Communication: Discussed with patient  Disposition Plan: Anticipate discharge tomorrow morning    LOS: 2 days   [facility] Pager [phone] 11/27/2014, 10:27 AM  If 7PM-7AM, please contact night-coverage at [email or web address], password [facility]

## 2014-11-27 NOTE — H&P (View-Only) (Signed)
[facility] Consult: 8:18 AM 11/26/2014  LOS: 1 day    Referring Provider: Dr Chase Caller  Primary Care Physician:  Walker Kehr, MD Primary Gastroenterologist:  Dr. Fuller Plan    Reason for Consultation:  LGIB, painless hematochezia.    HPI: Scott Oneal is a 74 y.o. male.  PMH CAD, chronic Plavix, celiac disease, hx colon polyps,asthma.   S/p colonoscopy with polypectomy x 2 on 11/18/14 by Dr Fuller Plan.  Resumed Plavix and 81 ASA 1/20.    Had normal, brown BMs after scope.  8 PM last night was the first of 7 or so large volume, painless, bloody stools.  Last large volume stool was 1AM.  Has couple of smaller clots this AM.  Did have some nausea, sweats, dizziness, weakness with BPs to 79/54 last night in ED that resolved with Trendelenberg and 1 litre NS.   Past Medical History  Diagnosis Date  . Coronary atherosclerosis of unspecified type of vessel, native or graft   . Old myocardial infarction   . Unspecified essential hypertension   . Pure hypercholesterolemia   . Long term (current) use of anticoagulants   . Other specified cardiac dysrhythmias(427.89)   . Esophageal reflux   . Other malaise and fatigue   . Other testicular hypofunction   . Personal history of colonic polyps   . Osteoporosis, unspecified   . Calculus of gallbladder without mention of cholecystitis   . Cholelithiasis   . Celiac disease   . Loss of weight   . Unspecified asthma(493.90)   . Diarrhea   . Unspecified hypothyroidism   . Gout, unspecified   . Benign neoplasm of colon   . Renal insufficiency     chronic  . Hyperparathyroidism   . Anxiety   . Arthritis   . Cataract     bilateral cateracts removed  . Glaucoma     Past Surgical History  Procedure Laterality Date  . Knee surgery      right  . Coronary stent  placement  2011    Cypher; in distal circumflex artery  . Cystoscopy  1998  . Shoulder surgery Left 9/14    Dr Shara Blazing  . Colonoscopy    . Upper gastrointestinal endoscopy    . Coronary angioplasty    . Vascular surgery    . Colon surgery    . Eye surgery      Prior to Admission medications   Medication Sig Start Date End Date Taking? Authorizing Provider  albuterol (PROAIR HFA) 108 (90 BASE) MCG/ACT inhaler Inhale 2 puffs into the lungs every 6 (six) hours as needed for wheezing or shortness of breath. 05/14/14  Yes Elsie Stain, MD  aspirin 81 MG tablet Take 81 mg by mouth daily.     Yes Historical Provider, MD  budesonide-formoterol (SYMBICORT) 160-4.5 MCG/ACT inhaler Inhale 2 puffs into the lungs 2 (two) times daily. 05/14/14  Yes Elsie Stain, MD  calcitRIOL (ROCALTROL) 0.25 MCG capsule Take 1 capsule (0.25 mcg total) by mouth daily. 04/15/14  Yes Aleksei Plotnikov V, MD  carvedilol (COREG)  25 MG tablet TAKE 1 TABLET BY MOUTH TWICE A DAY WITH A MEAL 10/08/14  Yes Aleksei Plotnikov V, MD  clopidogrel (PLAVIX) 75 MG tablet Take 1 tablet (75 mg total) by mouth daily. 04/17/14  Yes Aleksei Plotnikov V, MD  fish oil-omega-3 fatty acids 1000 MG capsule Take 1 g by mouth daily.     Yes Historical Provider, MD  levothyroxine (SYNTHROID, LEVOTHROID) 125 MCG tablet Take 1 tablet (125 mcg total) by mouth daily before breakfast. 02/04/14  Yes Aleksei Plotnikov V, MD  nitroGLYCERIN (NITROSTAT) 0.4 MG SL tablet Place 1 tablet (0.4 mg total) under the tongue every 5 (five) minutes as needed. 08/11/14  Yes Aleksei Plotnikov V, MD  olmesartan (BENICAR) 20 MG tablet Take 1 tablet (20 mg total) by mouth daily. 07/29/14  Yes Aleksei Plotnikov V, MD  sildenafil (VIAGRA) 100 MG tablet Take 100 mg by mouth daily as needed.     Yes Historical Provider, MD  simvastatin (ZOCOR) 10 MG tablet Hold while on diflucan then resume Patient taking differently: Take 10 mg by mouth daily at 6 PM.  05/14/14  Yes Elsie Stain, MD  testosterone cypionate (DEPOTESTOTERONE CYPIONATE) 200 MG/ML injection INJECT 2MLS EVERY 2 WEEKS 09/11/14  Yes Aleksei Plotnikov V, MD  triamcinolone cream (KENALOG) 0.1 % Apply 1 application topically 2 (two) times daily as needed (rash).  04/30/14  Yes Aleksei Plotnikov V, MD  clonazePAM (KLONOPIN) 0.25 MG disintegrating tablet Take 1 tablet (0.25 mg total) by mouth 2 (two) times daily as needed (anxiety). Patient not taking: Reported on 11/18/2014 11/27/13   Tyrone Apple Plotnikov V, MD  traMADol (ULTRAM) 50 MG tablet Take 1-2 tablets (50-100 mg total) by mouth 2 (two) times daily as needed. Patient not taking: Reported on 11/25/2014 06/10/14   Cassandria Anger, MD    Scheduled Meds: . antiseptic oral rinse  7 mL Mouth Rinse BID  . budesonide-formoterol  2 puff Inhalation BID  . levothyroxine  125 mcg Oral QAC breakfast   Infusions: . sodium chloride 125 mL/hr at 11/26/14 0042   PRN Meds: sodium chloride, albuterol   Allergies as of 11/25/2014 - Review Complete 11/25/2014  Allergen Reaction Noted  . Allantoin-pramoxine    . Metoprolol tartrate  05/12/2009    Family History  Problem Relation Age of Onset  . Hypertension    . Asthma Neg Hx   . Colon cancer Neg Hx   . Esophageal cancer Neg Hx   . Rectal cancer Neg Hx   . Stomach cancer Neg Hx   . Lymphoma Father   . Cancer Father     lymphoma  . Vision loss Maternal Grandmother     History   Social History  . Marital Status: Married    Spouse Name: Linford Quintela    Number of Children: 3  . Years of Education: N/A   Occupational History  . retired     Engineer, production   Social History Main Topics  . Smoking status: Never Smoker   . Smokeless tobacco: Never Used  . Alcohol Use: No  . Drug Use: No  . Sexual Activity: Yes   Other Topics Concern  . Not on file   Social History Narrative   Retired.   Regular Exercise-Yes; yoga & silver sneakers   Daily Caffeine Use.             REVIEW OF  SYSTEMS: Constitutional:  Stable weight.  active ENT:  No nose bleeds Pulm:  No  Sob or cough CV:  No palpitations, no LE edema.  GU:  No hematuria, no frequency GI:  Per HPI.  Sticks to gluten free diet Heme:  No hx anemia   Transfusions:  None ever Neuro:  No headaches, no peripheral tingling or numbness Derm:  No itching, no rash or sores.  Endocrine:  No sweats or chills.  No polyuria or dysuria Immunization:  Up to date flu shot.  Travel:  None beyond local counties in last few months.    PHYSICAL EXAM: Vital signs in last 24 hours: Filed Vitals:   11/26/14 0630  BP: 125/77  Pulse: 66  Temp:   Resp: 16   Wt Readings from Last 3 Encounters:  11/26/14 212 lb 15.4 oz (96.6 kg)  11/18/14 217 lb (98.431 kg)  09/11/14 217 lb 9.6 oz (98.703 kg)    General: pleasant, looks well.  comfortable Head:  No asymmetry or swelling  Eyes:  No icterus or conj pallor Ears:  Slight HOH  Nose:  No congestion or discharge Mouth:  Clear, moist.  Neck:  No JVD, no mass, no TMG Lungs:  Clear bil.  No cough or labored breathing.   Heart: RRR.  No MRG.  S1S2 audible Abdomen:  Soft, NT, ND.  Active BS.   Rectal: not performed   Musc/Skeltl: no joint swelling or redness Extremities:  No CCE  Neurologic:  Pleasant, alert/oriented x 3.  No tremor Skin:  No rash or sores Tattoos:  none Nodes:  No cervical adenopathy.    Psych:  Pleasant, alert, relaxed.   Intake/Output from previous day: 01/26 0701 - 01/27 0700 In: 1662.5 [I.V.:662.5; IV Piggyback:1000] Out: 1050 [Urine:650; Stool:400] Intake/Output this shift:    LAB RESULTS:  Recent Labs  11/25/14 2201 11/25/14 2348 11/26/14 0224  WBC 6.5  --  8.1  HGB 14.8 14.3 13.2  HCT 43.7 42.5 39.7  PLT 107*  --  95*   BMET Lab Results  Component Value Date   NA 139 11/26/2014   NA 138 11/25/2014   NA 141 08/07/2014   K 4.6 11/26/2014   K 4.9 11/25/2014   K 4.3 08/07/2014   CL 108 11/26/2014   CL 106 11/25/2014   CL 105  08/07/2014   CO2 26 11/26/2014   CO2 26 11/25/2014   CO2 30 08/07/2014   GLUCOSE 133* 11/26/2014   GLUCOSE 149* 11/25/2014   GLUCOSE 90 08/07/2014   BUN 14 11/26/2014   BUN 14 11/25/2014   BUN 18 08/07/2014   CREATININE 1.89* 11/26/2014   CREATININE 2.02* 11/25/2014   CREATININE 1.9* 08/07/2014   CALCIUM 7.9* 11/26/2014   CALCIUM 8.9 11/25/2014   CALCIUM 9.0 08/07/2014   LFT  Recent Labs  11/25/14 2201  PROT 5.3*  ALBUMIN 3.5  AST 28  ALT 18  ALKPHOS 46  BILITOT 0.8   PT/INR No results found for: INR, PROTIME  Drugs of Abuse  No results found for: LABOPIA, COCAINSCRNUR, LABBENZ, AMPHETMU, THCU, LABBARB   RADIOLOGY STUDIES: No results found.  ENDOSCOPIC STUDIES: 11/18/14  Colonoscopy  For hx adenomatous polyps  ENDOSCOPIC IMPRESSION: 1. Sessile polyp in the ascending colon; polypectomy performed with a cold snare 2. Sessile polyp in the transverse colon; polypectomy performed using snare cautery 3. Grade I internal hemorrhoids RECOMMENDATIONS: 1. Await pathology results 2. Repeat Colonoscopy in 3 years. 3. Resume Plavix and ASA tomorrow Pathology: Transverse: tubular adenoma with focus of HGD.  Ascending Tubular adenoma, no HGD.   07/2009  Colonoscopy follow-up of polyp, tubulovillous adenoma, 02/2001 ENDOSCOPIC  IMPRESSION: 1) 5 mm sessile polyp in the ascending colon Pathology: 1. COLON, BIOPSY: BENIGN COLONIC MUCOSA. NO SIGNIFICANT INFLAMMATION OR OTHER ABNORMALITIES IDENTIFIED. 2. descending COLON, POLYP(S): ADENOMATOUS POLYP. NO HIGH GRADE DYSPLASIA OR INVASIVE MALIGNANCY IDENTIFIED.  03/2008  EGD Duodenitis without Hemorrhage. DUODENUM, BIOPSY: SUBTOTAL VILLOUS ATROPHY WITH CRYPT HYPERPLASIA AND INCREASED INTRAEPITHELIAL LYMPHOCYTES. SEE COMMENT.  2002 and 2007 Colonoscopies  Both with polypectomies.    IMPRESSION:   *  Post polypectomy bleed.  Hgb has dropped but not in need of transfusion.  Long standing hx of recurrent  colon polyps. The transverse polyp removed 1/19 shows focal HGD.  *  Thrombocytopenia. Not new.  Present since 2008.   *  Chronic Plavix for hx CAD/2005 cardiac stents.  *  Celiac disease     PLAN:     *  Observe today off ASA and Plavix.  Follow Hgb. .  Prep starting tonite with possible colonoscopy tomorrow.  If bleeding stops, may not pursue the colonoscopy but will prep just in case. Pt aware an agreeable   Scott Oneal  11/26/2014, 8:18 AM Pager: [phone]     ________________________________________________________________________  Velora Heckler GI MD note:  I personally examined the patient, reviewed the data and agree with the assessment and plan described above.  Likely bleeding from the polyp site which required cautery.  Will plan on prepping tonight, repeat colonoscopy tomorrow.  Hb 13 and he's HD stable.  Hold ASA, plavix for now. Will get coags.   Owens Loffler, MD [facility] Pager [phone]

## 2014-11-27 NOTE — Progress Notes (Signed)
Scott Oneal discharged Home with wife per MD order.  Discharge instructions reviewed and discussed with the patient, all questions and concerns answered. Copy of instructions, care notes and scripts given to patient.    Medication List    TAKE these medications        albuterol 108 (90 BASE) MCG/ACT inhaler  Commonly known as:  PROAIR HFA  Inhale 2 puffs into the lungs every 6 (six) hours as needed for wheezing or shortness of breath.     aspirin 81 MG tablet  Take 1 tablet (81 mg total) by mouth daily. Resume 11/28/14     budesonide-formoterol 160-4.5 MCG/ACT inhaler  Commonly known as:  SYMBICORT  Inhale 2 puffs into the lungs 2 (two) times daily.     calcitRIOL 0.25 MCG capsule  Commonly known as:  ROCALTROL  Take 1 capsule (0.25 mcg total) by mouth daily.     carvedilol 25 MG tablet  Commonly known as:  COREG  TAKE 1 TABLET BY MOUTH TWICE A DAY WITH A MEAL     clonazePAM 0.25 MG disintegrating tablet  Commonly known as:  KLONOPIN  Take 1 tablet (0.25 mg total) by mouth 2 (two) times daily as needed (anxiety).     clopidogrel 75 MG tablet  Commonly known as:  PLAVIX  Take 1 tablet (75 mg total) by mouth daily. RESUME ONLY ON 12/02/14     fish oil-omega-3 fatty acids 1000 MG capsule  Take 1 g by mouth daily.     levothyroxine 125 MCG tablet  Commonly known as:  SYNTHROID, LEVOTHROID  Take 1 tablet (125 mcg total) by mouth daily before breakfast.     nitroGLYCERIN 0.4 MG SL tablet  Commonly known as:  NITROSTAT  Place 1 tablet (0.4 mg total) under the tongue every 5 (five) minutes as needed.     olmesartan 20 MG tablet  Commonly known as:  BENICAR  Take 1 tablet (20 mg total) by mouth daily.     sildenafil 100 MG tablet  Commonly known as:  VIAGRA  Take 100 mg by mouth daily as needed.     simvastatin 10 MG tablet  Commonly known as:  ZOCOR  Hold while on diflucan then resume     testosterone cypionate 200 MG/ML injection  Commonly known as:  DEPOTESTOTERONE  CYPIONATE  INJECT 2MLS EVERY 2 WEEKS     traMADol 50 MG tablet  Commonly known as:  ULTRAM  Take 1-2 tablets (50-100 mg total) by mouth 2 (two) times daily as needed.     triamcinolone cream 0.1 %  Commonly known as:  KENALOG  Apply 1 application topically 2 (two) times daily as needed (rash).        Patients skin is clean, dry and intact, no evidence of skin break down. IV site discontinued and catheter remains intact. Site without signs and symptoms of complications. Dressing and pressure applied.  Patient escorted to car by NT in a wheelchair,  no distress noted upon discharge.  Scott Oneal, Scott Oneal 11/27/2014 5:13 PM

## 2014-11-27 NOTE — Anesthesia Postprocedure Evaluation (Signed)
  Anesthesia Post-op Note  Patient: Scott Oneal  Procedure(s) Performed: Procedure(s) (LRB): COLONOSCOPY WITH PROPOFOL (N/A)  Patient Location: PACU  Anesthesia Type: MAC  Level of Consciousness: awake and alert   Airway and Oxygen Therapy: Patient Spontanous Breathing  Post-op Pain: mild  Post-op Assessment: Post-op Vital signs reviewed, Patient's Cardiovascular Status Stable, Respiratory Function Stable, Patent Airway and No signs of Nausea or vomiting  Last Vitals:  Filed Vitals:   11/27/14 1223  BP: 137/78  Pulse: 81  Temp: 37 C  Resp: 14    Post-op Vital Signs: stable   Complications: No apparent anesthesia complications

## 2014-11-27 NOTE — Discharge Summary (Signed)
Scott Oneal  Physician Discharge Summary   Patient ID: HIEP OLLIS MRN: [id number] DOB/AGE: 03/14/1941 74 y.o.  Admit date: 11/25/2014 Discharge date: 11/27/2014  PCP: Walker Kehr, MD  DISCHARGE DIAGNOSES:  Active Problems:   Rectal bleeding   Lower GI bleed   RECOMMENDATIONS FOR OUTPATIENT FOLLOW UP: 1. GI to arrange follow up.  DISCHARGE CONDITION: fair  Diet recommendation: Heart healthy  Filed Weights   11/25/14 2140 11/26/14 0205 11/27/14 0517  Weight: 97.523 kg (215 lb) 96.6 kg (212 lb 15.4 oz) 101.1 kg (222 lb 14.2 oz)    INITIAL HISTORY: 74 year old Caucasian male brought into the emergency department due to rectal bleeding. He was hypotensive. He reports a history of for colonoscopy recently with polypectomy. He was admitted for further management.  Consultants: Gastroenterology  Procedures:  Colonoscopy on 1/28 ENDOSCOPIC IMPRESSION: There was ulceration and small visible vessel at the site of transverse polypectomy. This was treated with endoclip placement. No recent or old blood in the colon.  Antibiotics: None   HOSPITAL COURSE:   Lower GI bleed with transient hemorrhagic shock Appears to be post polypectomy. GI has seen and patient underwent colonoscopy earlier today. Possible site of bleeding was identified which was clipped. Plan was to continue to monitor him for recurrence. However, patient was adamant that he wanted to go home. His last bowel movement was this morning at 5:30. Did not have any bleeding in that. His hemoglobin has been stable. I have conveyed to the patient that it would be preferable if he stays overnight, but he wants to leave. So we will discharge him. His blood pressure was low initially when he was admitted and that has stabilized.  Acute blood loss anemia Hemoglobin has remained stable. No indications for transfusion.   History of coronary artery disease status post stent placement in 2005 Antiplatelet agents  were placed on hold. Per GI, Aspirin can be resumed tomorrow and Plavix in 5 days. Continue beta blockers and ARB.  History of asthma Not an active issue currently. Continue with maintenance medications.  CKD stage III Renal function at baseline.  History of hypothyroidism Continue with Synthroid    PERTINENT LABS:  The results of significant diagnostics from this hospitalization (including imaging, microbiology, ancillary and laboratory) are listed below for reference.    Microbiology: Recent Results (from the past 240 hour(s))  MRSA PCR Screening     Status: None   Collection Time: 11/26/14  2:04 AM  Result Value Ref Range Status   MRSA by PCR NEGATIVE NEGATIVE Final    Comment:        The GeneXpert MRSA Assay (FDA approved for NASAL specimens only), is one component of a comprehensive MRSA colonization surveillance program. It is not intended to diagnose MRSA infection nor to guide or monitor treatment for MRSA infections.      Labs: Basic Metabolic Panel:  Recent Labs Lab 11/25/14 2201 11/26/14 0224  NA 138 139  K 4.9 4.6  CL 106 108  CO2 26 26  GLUCOSE 149* 133*  BUN 14 14  CREATININE 2.02* 1.89*  CALCIUM 8.9 7.9*  MG  --  1.7  PHOS  --  2.5   Liver Function Tests:  Recent Labs Lab 11/25/14 2201  AST 28  ALT 18  ALKPHOS 46  BILITOT 0.8  PROT 5.3*  ALBUMIN 3.5   CBC:  Recent Labs Lab 11/25/14 2201 11/25/14 2348 11/26/14 0224 11/26/14 1506 11/26/14 2200 11/27/14 0832  WBC 6.5  --  8.1 5.8  5.2 4.7  HGB 14.8 14.3 13.2 12.5* 11.8* 12.5*  HCT 43.7 42.5 39.7 37.5* 35.6* 38.0*  MCV 90.5  --  92.1 91.5 93.0 93.1  PLT 107*  --  95* 87* 89* 88*   Cardiac Enzymes:  Recent Labs Lab 11/26/14 0224  TROPONINI <0.03   CBG:  Recent Labs Lab 11/26/14 0204  GLUCAP 125*    DISCHARGE EXAMINATION: See progress note from earlier today.  DISPOSITION: Home  Discharge Instructions    Call MD for:  difficulty breathing, headache or  visual disturbances    Complete by:  As directed      Call MD for:  extreme fatigue    Complete by:  As directed      Call MD for:  persistant dizziness or light-headedness    Complete by:  As directed      Call MD for:  persistant nausea and vomiting    Complete by:  As directed      Call MD for:  severe uncontrolled pain    Complete by:  As directed      Diet - low sodium heart healthy    Complete by:  As directed      Discharge instructions    Complete by:  As directed   Bells 1/29. RESUME PLAVIX 2/2.  Seek attention if you start bleeding again. You have been asked to stay additional day but you wish to leave today. So you are being discharged.     Increase activity slowly    Complete by:  As directed            ALLERGIES:  Allergies  Allergen Reactions  . Allantoin-Pramoxine     Pt does't remember reaction  . Metoprolol Tartrate     REACTION: fatigue     Current Discharge Medication List    CONTINUE these medications which have CHANGED   Details  aspirin 81 MG tablet Take 1 tablet (81 mg total) by mouth daily. Resume 11/28/14 Qty: 30 tablet    clopidogrel (PLAVIX) 75 MG tablet Take 1 tablet (75 mg total) by mouth daily. RESUME ONLY ON 12/02/14 Qty: 90 tablet, Refills: 3      CONTINUE these medications which have NOT CHANGED   Details  albuterol (PROAIR HFA) 108 (90 BASE) MCG/ACT inhaler Inhale 2 puffs into the lungs every 6 (six) hours as needed for wheezing or shortness of breath. Qty: 1 Inhaler, Refills: 3    budesonide-formoterol (SYMBICORT) 160-4.5 MCG/ACT inhaler Inhale 2 puffs into the lungs 2 (two) times daily. Qty: 2 Inhaler, Refills: 6   Associated Diagnoses: Unspecified asthma, with status asthmaticus    calcitRIOL (ROCALTROL) 0.25 MCG capsule Take 1 capsule (0.25 mcg total) by mouth daily. Qty: 90 capsule, Refills: 3    carvedilol (COREG) 25 MG tablet TAKE 1 TABLET BY MOUTH TWICE A DAY WITH A MEAL Qty: 180 tablet, Refills: 3    fish  oil-omega-3 fatty acids 1000 MG capsule Take 1 g by mouth daily.      levothyroxine (SYNTHROID, LEVOTHROID) 125 MCG tablet Take 1 tablet (125 mcg total) by mouth daily before breakfast. Qty: 90 tablet, Refills: 3    nitroGLYCERIN (NITROSTAT) 0.4 MG SL tablet Place 1 tablet (0.4 mg total) under the tongue every 5 (five) minutes as needed. Qty: 20 tablet, Refills: 5    olmesartan (BENICAR) 20 MG tablet Take 1 tablet (20 mg total) by mouth daily. Qty: 90 tablet, Refills: 2    sildenafil (VIAGRA) 100 MG tablet Take 100  mg by mouth daily as needed.      simvastatin (ZOCOR) 10 MG tablet Hold while on diflucan then resume Qty: 90 tablet, Refills: 3    testosterone cypionate (DEPOTESTOTERONE CYPIONATE) 200 MG/ML injection INJECT 2MLS EVERY 2 WEEKS Qty: 10 mL, Refills: 2    triamcinolone cream (KENALOG) 0.1 % Apply 1 application topically 2 (two) times daily as needed (rash).     clonazePAM (KLONOPIN) 0.25 MG disintegrating tablet Take 1 tablet (0.25 mg total) by mouth 2 (two) times daily as needed (anxiety). Qty: 60 tablet, Refills: 1    traMADol (ULTRAM) 50 MG tablet Take 1-2 tablets (50-100 mg total) by mouth 2 (two) times daily as needed. Qty: 60 tablet, Refills: 1       Follow-up Information    Follow up with Walker Kehr, MD.   Specialty:  Internal Medicine   Why:  As needed   Contact information:   [place] [address] [phone]       Follow up with Norberto Sorenson T. Fuller Plan, MD.   Specialty:  Gastroenterology   Why:  for post hospitalization follow up   Contact information:   [address] [address] [phone]       TOTAL DISCHARGE TIME: 97 minutes  Huslia Oneal Pager [phone]  11/27/2014, 3:25 PM

## 2014-11-27 NOTE — Anesthesia Preprocedure Evaluation (Signed)
Anesthesia Evaluation  Patient identified by MRN, date of birth, ID band Patient awake    Reviewed: Allergy & Precautions, NPO status , Patient's Chart, lab work & pertinent test results  Airway Mallampati: II  TM Distance: >3 FB Neck ROM: Full    Dental no notable dental hx.    Pulmonary asthma ,  breath sounds clear to auscultation  Pulmonary exam normal       Cardiovascular hypertension, Pt. on medications + CAD, + Past MI, + Cardiac Stents and + Peripheral Vascular Disease Rhythm:Regular Rate:Normal     Neuro/Psych negative neurological ROS  negative psych ROS   GI/Hepatic negative GI ROS, Neg liver ROS,   Endo/Other  Hypothyroidism   Renal/GU negative Renal ROS  negative genitourinary   Musculoskeletal negative musculoskeletal ROS (+)   Abdominal   Peds negative pediatric ROS (+)  Hematology negative hematology ROS (+)   Anesthesia Other Findings   Reproductive/Obstetrics negative OB ROS                             Anesthesia Physical Anesthesia Plan  ASA: III  Anesthesia Plan: MAC   Post-op Pain Management:    Induction: Intravenous  Airway Management Planned: Nasal Cannula  Additional Equipment:   Intra-op Plan:   Post-operative Plan:   Informed Consent: I have reviewed the patients History and Physical, chart, labs and discussed the procedure including the risks, benefits and alternatives for the proposed anesthesia with the patient or authorized representative who has indicated his/her understanding and acceptance.   Dental advisory given  Plan Discussed with: CRNA and Surgeon  Anesthesia Plan Comments:         Anesthesia Quick Evaluation

## 2014-11-28 ENCOUNTER — Telehealth: Payer: Self-pay | Admitting: *Deleted

## 2014-11-28 ENCOUNTER — Encounter (HOSPITAL_COMMUNITY): Payer: Self-pay | Admitting: Gastroenterology

## 2014-11-28 NOTE — Care Management Note (Signed)
    Page 1 of 1   11/28/2014     8:19:41 AM CARE MANAGEMENT NOTE 11/28/2014  Patient:  DIAN, MINAHAN   Account Number:  [id number]  Date Initiated:  11/26/2014  Documentation initiated by:  Luz Lex  Subjective/Objective Assessment:   Admitted with LGIB - post colonoscopy on 1-19     Action/Plan:   Anticipated DC Date:  11/27/2014   Anticipated DC Plan:  [place]  CM consult      Choice offered to / List presented to:             Status of service:  Completed, signed off Medicare Important Message given?  YES (If response is "NO", the following Medicare IM given date fields will be blank) Date Medicare IM given:  11/27/2014 Medicare IM given by:  Tomi Bamberger Date Additional Medicare IM given:   Additional Medicare IM given by:    Discharge Disposition:  HOME/SELF CARE  Per UR Regulation:  Reviewed for med. necessity/level of care/duration of stay  If discussed at [place] of Stay Meetings, dates discussed:    Comments:  ContactGareth, [place] [phone]  [phone]   11/28/14 [place] RN,BSN [phone] patient  dc'd  , no needs anticipated.

## 2014-11-28 NOTE — Telephone Encounter (Signed)
Pt was on TCM list, but will be following up with GI. Did not make appt with pcp...Scott Oneal

## 2014-12-18 ENCOUNTER — Encounter: Payer: Self-pay | Admitting: Internal Medicine

## 2015-01-06 ENCOUNTER — Other Ambulatory Visit: Payer: Self-pay | Admitting: Internal Medicine

## 2015-01-24 ENCOUNTER — Other Ambulatory Visit: Payer: Self-pay | Admitting: Internal Medicine

## 2015-01-28 ENCOUNTER — Telehealth: Payer: Self-pay | Admitting: Internal Medicine

## 2015-01-28 MED ORDER — LEVOTHYROXINE SODIUM 125 MCG PO TABS: 125.0000 ug | ORAL_TABLET | Freq: Every day | ORAL | Status: DC

## 2015-01-28 NOTE — Telephone Encounter (Signed)
Called pt no answer LMOM sent 30 day to [facility]...Scott Oneal

## 2015-01-28 NOTE — Telephone Encounter (Signed)
Patient has an appointment scheduled for 02/11/2015. Asks that we fill levothyroxine for temporary RX in the meantime. Please call him and let him know in the meantime

## 2015-02-11 ENCOUNTER — Encounter: Payer: Self-pay | Admitting: Internal Medicine

## 2015-02-11 ENCOUNTER — Encounter: Payer: Self-pay | Admitting: Cardiovascular Disease

## 2015-02-11 ENCOUNTER — Ambulatory Visit (INDEPENDENT_AMBULATORY_CARE_PROVIDER_SITE_OTHER): Payer: Medicare HMO | Admitting: Internal Medicine

## 2015-02-11 ENCOUNTER — Ambulatory Visit (INDEPENDENT_AMBULATORY_CARE_PROVIDER_SITE_OTHER): Payer: Medicare HMO | Admitting: Cardiovascular Disease

## 2015-02-11 VITALS — BP 122/82 | HR 55 | Ht 72.0 in | Wt 221.4 lb

## 2015-02-11 VITALS — BP 148/80 | HR 71 | Ht 72.0 in | Wt 222.0 lb

## 2015-02-11 DIAGNOSIS — I2510 Atherosclerotic heart disease of native coronary artery without angina pectoris: Secondary | ICD-10-CM

## 2015-02-11 DIAGNOSIS — Z0000 Encounter for general adult medical examination without abnormal findings: Secondary | ICD-10-CM | POA: Diagnosis not present

## 2015-02-11 DIAGNOSIS — E034 Atrophy of thyroid (acquired): Secondary | ICD-10-CM

## 2015-02-11 DIAGNOSIS — I10 Essential (primary) hypertension: Secondary | ICD-10-CM

## 2015-02-11 DIAGNOSIS — I252 Old myocardial infarction: Secondary | ICD-10-CM

## 2015-02-11 DIAGNOSIS — N320 Bladder-neck obstruction: Secondary | ICD-10-CM

## 2015-02-11 MED ORDER — OLMESARTAN MEDOXOMIL 20 MG PO TABS
20.0000 mg | ORAL_TABLET | Freq: Every day | ORAL | Status: DC
Start: 2015-02-11 — End: 2016-07-27

## 2015-02-11 NOTE — Progress Notes (Signed)
Cardiology Office Note   Date:  02/11/2015   ID:  Scott, Oneal 1941-03-23, MRN [id number]  PCP:  Walker Kehr, MD  Cardiologist:  Sherren Mocha, MD    No chief complaint on file.    History of Present Illness: Scott Oneal is a 74 y.o. male who presents for follow-up of coronary artery disease. Other medical conditions include chronic kidney disease, hypertension, and hyperlipidemia.  He initially presented in 2005 with an inferior wall myocardial infarction. He was treated with overlapping Cypher stents in the right coronary artery. He underwent staged PCI of the left circumflex during his index hospitalization. He has been maintained on long-term dual antiplatelet therapy with aspirin and Plavix. He was noted to have diffuse nonobstructive disease of the LAD. LV function was preserved at the time of his myocardial infarction with an estimated ejection fraction of 60%.  Review chart review shows that the patient was hospitalized in January with rectal bleeding that occurred about one week after a polypectomy. His aspirin and Plavix were held for a few days.  He has some problems with allergies and asthma and sees Dr Joya Gaskins. Otherwise he is doing very well. He denies chest pain or pressure. No other complaints today.  Past Medical History  Diagnosis Date  . Coronary atherosclerosis of unspecified type of vessel, native or graft   . Old myocardial infarction   . Unspecified essential hypertension   . Pure hypercholesterolemia   . Long term (current) use of anticoagulants   . Other specified cardiac dysrhythmias(427.89)   . Esophageal reflux   . Other malaise and fatigue   . Other testicular hypofunction   . Personal history of colonic polyps   . Osteoporosis, unspecified   . Calculus of gallbladder without mention of cholecystitis   . Cholelithiasis   . Celiac disease   . Loss of weight   . Unspecified asthma(493.90)   . Diarrhea   . Unspecified hypothyroidism     . Gout, unspecified   . Benign neoplasm of colon   . Renal insufficiency     chronic  . Hyperparathyroidism   . Anxiety   . Arthritis   . Cataract     bilateral cateracts removed  . Glaucoma     Past Surgical History  Procedure Laterality Date  . Knee surgery      right  . Coronary stent placement  2011    Cypher; in distal circumflex artery  . Cystoscopy  1998  . Shoulder surgery Left 9/14    Dr Shara Blazing  . Colonoscopy    . Upper gastrointestinal endoscopy    . Coronary angioplasty    . Vascular surgery    . Colon surgery    . Eye surgery    . Colonoscopy with propofol N/A 11/27/2014    Procedure: COLONOSCOPY WITH PROPOFOL;  Surgeon: Milus Banister, MD;  Location: [facility];  Service: Endoscopy;  Laterality: N/A;    Current Outpatient Prescriptions  Medication Sig Dispense Refill  . albuterol (PROAIR HFA) 108 (90 BASE) MCG/ACT inhaler Inhale 2 puffs into the lungs every 6 (six) hours as needed for wheezing or shortness of breath. 1 Inhaler 3  . aspirin 81 MG tablet Take 81 mg by mouth daily.    . budesonide-formoterol (SYMBICORT) 160-4.5 MCG/ACT inhaler Inhale 2 puffs into the lungs 2 (two) times daily. 2 Inhaler 6  . calcitRIOL (ROCALTROL) 0.25 MCG capsule Take 1 capsule (0.25 mcg total) by mouth daily. 90 capsule 3  . carvedilol (  COREG) 25 MG tablet TAKE 1 TABLET BY MOUTH TWICE A DAY WITH A MEAL 180 tablet 3  . clonazePAM (KLONOPIN) 0.25 MG disintegrating tablet Take 1 tablet (0.25 mg total) by mouth 2 (two) times daily as needed (anxiety). 60 tablet 1  . clopidogrel (PLAVIX) 75 MG tablet Take 75 mg by mouth daily.    . fish oil-omega-3 fatty acids 1000 MG capsule Take 1 g by mouth daily.      Marland Kitchen levothyroxine (SYNTHROID, LEVOTHROID) 125 MCG tablet Take 1 tablet (125 mcg total) by mouth daily before breakfast. 30 tablet 0  . nitroGLYCERIN (NITROSTAT) 0.4 MG SL tablet Place 1 tablet (0.4 mg total) under the tongue every 5 (five) minutes as needed. 20 tablet 5  .  olmesartan (BENICAR) 20 MG tablet Take 1 tablet (20 mg total) by mouth daily. 90 tablet 3  . sildenafil (VIAGRA) 100 MG tablet Take 100 mg by mouth daily as needed.      . simvastatin (ZOCOR) 10 MG tablet Take 10 mg by mouth daily at 6 PM.    . testosterone cypionate (DEPOTESTOTERONE CYPIONATE) 200 MG/ML injection INJECT 2MLS EVERY 2 WEEKS 10 mL 2   No current facility-administered medications for this visit.    Allergies:   Allantoin-pramoxine and Metoprolol tartrate   Social History:  The patient  reports that he has never smoked. He has never used smokeless tobacco. He reports that he does not drink alcohol or use illicit drugs.   Family History:  The patient's family history includes Cancer in his father; Hypertension in an other family member; Lymphoma in his father; Vision loss in his maternal grandmother. There is no history of Asthma, Colon cancer, Esophageal cancer, Rectal cancer, or Stomach cancer.    ROS:  Please see the history of present illness.  Otherwise, review of systems is positive for DOE, easy bruising, and wheezing.  All other systems are reviewed and negative.    PHYSICAL EXAM: VS:  BP 122/82 mmHg  Pulse 55  Ht 6' (1.829 m)  Wt 221 lb 6.4 oz (100.426 kg)  BMI 30.02 kg/m2 , BMI Body mass index is 30.02 kg/(m^2). GEN: Well nourished, well developed, in no acute distress HEENT: normal Neck: no JVD, no masses. No carotid bruits Cardiac: RRR without murmur or gallop                Respiratory:  clear to auscultation bilaterally, normal work of breathing GI: soft, nontender, nondistended, + BS MS: no deformity or atrophy Ext: no pretibial edema, pedal pulses 2+= bilaterally Skin: warm and dry, no rash Neuro:  Strength and sensation are intact Psych: euthymic mood, full affect  EKG:  EKG is ordered today. The ekg ordered today shows sinus bradycardia 55 bpm, possible age-indeterminate inferior MI, no significant ST-T changes  Recent Labs: 11/25/2014: ALT  18 11/26/2014: BUN 14; Creatinine 1.89*; Magnesium 1.7; Potassium 4.6; Sodium 139 11/27/2014: Hemoglobin 12.4*; Platelets 95*   Lipid Panel     Component Value Date/Time   CHOL 146 01/30/2014 0849   TRIG 83.0 01/30/2014 0849   HDL 40.80 01/30/2014 0849   CHOLHDL 4 01/30/2014 0849   VLDL 16.6 01/30/2014 0849   LDLCALC 89 01/30/2014 0849      Wt Readings from Last 3 Encounters:  02/11/15 221 lb 6.4 oz (100.426 kg)  02/11/15 222 lb (100.699 kg)  11/27/14 222 lb 14.2 oz (101.1 kg)    ASSESSMENT AND PLAN: 1.  CAD, native vessel, without symptoms of angina: the patient's medical program  was reviewed and will be continued without changes. He has had no more bleeding after his polypectomy. He is tolerating long-term dual antiplatelet therapy well. He will return in one year for follow-up evaluation. We discussed need for increased exercise, dietary modification, and weight loss.  2. Essential hypertension: blood pressure is well controlled on current medical program.  3. Hyperlipidemia: reviewed most recent lipid panel. Patient is on simvastatin.  4. CKD stage III: Stable renal function by recent labwork. He follows with Dr Moshe Cipro at [facility].   Current medicines are reviewed with the patient today.  The patient does not have concerns regarding medicines.  The following changes have been made:  no change  Labs/ tests ordered today include:   Orders Placed This Encounter  Procedures  . EKG 12-Lead    Disposition:   FU one year  Signed, Sherren Mocha, MD  02/11/2015 2:05 [facility] [address], [place], [place]  [address] Phone: [phone]; Fax: [phone]

## 2015-02-11 NOTE — Progress Notes (Signed)
   Subjective:    HPI  The patient is here for a wellness exam.  The patient presents for a follow-up of  chronic CAD, hypertension, chronic dyslipidemia, hypogonadism controlled with medicines. Pt had a lower GI bleed due to polypectomy.     Review of Systems  Constitutional: Negative for appetite change, fatigue and unexpected weight change.  HENT: Negative for congestion, nosebleeds, sneezing, sore throat and trouble swallowing.   Eyes: Negative for itching and visual disturbance.  Respiratory: Negative for cough.   Cardiovascular: Negative for chest pain, palpitations and leg swelling.  Gastrointestinal: Positive for diarrhea. Negative for nausea, blood in stool and abdominal distention.  Genitourinary: Negative for frequency and hematuria.  Musculoskeletal: Negative for back pain, joint swelling, gait problem and neck pain.  Skin: Negative for rash.  Neurological: Negative for dizziness, tremors, speech difficulty and weakness.  Psychiatric/Behavioral: Negative for sleep disturbance, dysphoric mood and agitation. The patient is nervous/anxious.        Objective:   Physical Exam  Constitutional: He is oriented to person, place, and time. He appears well-developed.  HENT:  Mouth/Throat: Oropharynx is clear and moist.  Eyes: Conjunctivae are normal. Pupils are equal, round, and reactive to light.  Neck: Normal range of motion. No JVD present. No thyromegaly present.  Cardiovascular: Normal rate, regular rhythm, normal heart sounds and intact distal pulses.  Exam reveals no gallop and no friction rub.   No murmur heard. Pulmonary/Chest: Effort normal and breath sounds normal. No respiratory distress. He has no wheezes. He has no rales. He exhibits no tenderness.  Abdominal: Soft. Bowel sounds are normal. He exhibits no distension and no mass. There is no tenderness. There is no rebound and no guarding.  Musculoskeletal: Normal range of motion. He exhibits no edema or  tenderness.  Lymphadenopathy:    He has no cervical adenopathy.  Neurological: He is alert and oriented to person, place, and time. He has normal reflexes. No cranial nerve deficit. He exhibits normal muscle tone. Coordination normal.  Skin: Skin is warm and dry. No rash noted.  Psychiatric: His behavior is normal. Judgment and thought content normal.   L shoulder is tender w/ROM Rectal - per Dr Diona Fanti  Lab Results  Component Value Date   WBC 5.0 11/27/2014   HGB 12.4* 11/27/2014   HCT 36.8* 11/27/2014   PLT 95* 11/27/2014   CHOL 146 01/30/2014   TRIG 83.0 01/30/2014   HDL 40.80 01/30/2014   ALT 18 11/25/2014   AST 28 11/25/2014   NA 139 11/26/2014   K 4.6 11/26/2014   CL 108 11/26/2014   CREATININE 1.89* 11/26/2014   BUN 14 11/26/2014   CO2 26 11/26/2014   TSH 5.78* 01/30/2014   PSA 3.30 01/30/2014   INR 1.20 11/26/2014   HGBA1C 5.4 05/05/2009           Assessment & Plan:

## 2015-02-11 NOTE — Assessment & Plan Note (Signed)
Olmestartan, Coreg

## 2015-02-11 NOTE — Assessment & Plan Note (Signed)
On Levothroid

## 2015-02-11 NOTE — Assessment & Plan Note (Signed)
Plavix, Simvastatin, Coreg

## 2015-02-11 NOTE — Assessment & Plan Note (Signed)
Here for medicare wellness/physical  Diet: heart healthy  Physical activity: not sedentary  Depression/mood screen: negative  Hearing: intact to whispered voice  Visual acuity: grossly normal, performs annual eye exam  ADLs: capable  Fall risk: none  Home safety: good  Cognitive evaluation: intact to orientation, naming, recall and repetition  EOL planning: adv directives, full code/ I agree  I have personally reviewed and have noted  1. The patient's medical, surgical and social history  2. Their use of alcohol, tobacco or illicit drugs  3. Their current medications and supplements  4. The patient's functional ability including ADL's, fall risks, home safety risks and hearing or visual impairment.  5. Diet and physical activities  6. Evidence for depression or mood disorders 7. Roster of doctors    Today patient counseled on age appropriate routine health concerns for screening and prevention, each reviewed and up to date or declined. Immunizations reviewed and up to date or declined. Labs ordered and reviewed. Risk factors for depression reviewed and negative. Hearing function and visual acuity are intact. ADLs screened and addressed as needed. Functional ability and level of safety reviewed and appropriate. Education, counseling and referrals performed based on assessed risks today. Patient provided with a copy of personalized plan for preventive services.

## 2015-02-11 NOTE — Progress Notes (Signed)
Pre visit review using our clinic review tool, if applicable. No additional management support is needed unless otherwise documented below in the visit note. 

## 2015-02-11 NOTE — Patient Instructions (Signed)
Preventive Care for Adults A healthy lifestyle and preventive care can promote health and wellness. Preventive health guidelines for men include the following key practices:  A routine yearly physical is a good way to check with your health care provider about your health and preventative screening. It is a chance to share any concerns and updates on your health and to receive a thorough exam.  Visit your dentist for a routine exam and preventative care every 6 months. Brush your teeth twice a day and floss once a day. Good oral hygiene prevents tooth decay and gum disease.  The frequency of eye exams is based on your age, health, family medical history, use of contact lenses, and other factors. Follow your health care provider's recommendations for frequency of eye exams.  Eat a healthy diet. Foods such as vegetables, fruits, whole grains, low-fat dairy products, and lean protein foods contain the nutrients you need without too many calories. Decrease your intake of foods high in solid fats, added sugars, and salt. Eat the right amount of calories for you.Get information about a proper diet from your health care provider, if necessary.  Regular physical exercise is one of the most important things you can do for your health. Most adults should get at least 150 minutes of moderate-intensity exercise (any activity that increases your heart rate and causes you to sweat) each week. In addition, most adults need muscle-strengthening exercises on 2 or more days a week.  Maintain a healthy weight. The body mass index (BMI) is a screening tool to identify possible weight problems. It provides an estimate of body fat based on height and weight. Your health care provider can find your BMI and can help you achieve or maintain a healthy weight.For adults 20 years and older:  A BMI below 18.5 is considered underweight.  A BMI of 18.5 to 24.9 is normal.  A BMI of 25 to 29.9 is considered overweight.  A BMI  of 30 and above is considered obese.  Maintain normal blood lipids and cholesterol levels by exercising and minimizing your intake of saturated fat. Eat a balanced diet with plenty of fruit and vegetables. Blood tests for lipids and cholesterol should begin at age 65 and be repeated every 5 years. If your lipid or cholesterol levels are high, you are over 50, or you are at high risk for heart disease, you may need your cholesterol levels checked more frequently.Ongoing high lipid and cholesterol levels should be treated with medicines if diet and exercise are not working.  If you smoke, find out from your health care provider how to quit. If you do not use tobacco, do not start.  Lung cancer screening is recommended for adults aged 27-80 years who are at high risk for developing lung cancer because of a history of smoking. A yearly low-dose CT scan of the lungs is recommended for people who have at least a 30-pack-year history of smoking and are a current smoker or have quit within the past 15 years. A pack year of smoking is smoking an average of 1 pack of cigarettes a day for 1 year (for example: 1 pack a day for 30 years or 2 packs a day for 15 years). Yearly screening should continue until the smoker has stopped smoking for at least 15 years. Yearly screening should be stopped for people who develop a health problem that would prevent them from having lung cancer treatment.  If you choose to drink alcohol, do not have more than  2 drinks per day. One drink is considered to be 12 ounces (355 mL) of beer, 5 ounces (148 mL) of wine, or 1.5 ounces (44 mL) of liquor.  Avoid use of street drugs. Do not share needles with anyone. Ask for help if you need support or instructions about stopping the use of drugs.  High blood pressure causes heart disease and increases the risk of stroke. Your blood pressure should be checked at least every 1-2 years. Ongoing high blood pressure should be treated with  medicines, if weight loss and exercise are not effective.  If you are 45-79 years old, ask your health care provider if you should take aspirin to prevent heart disease.  Diabetes screening involves taking a blood sample to check your fasting blood sugar level. This should be done once every 3 years, after age 45, if you are within normal weight and without risk factors for diabetes. Testing should be considered at a younger age or be carried out more frequently if you are overweight and have at least 1 risk factor for diabetes.  Colorectal cancer can be detected and often prevented. Most routine colorectal cancer screening begins at the age of 50 and continues through age 75. However, your health care provider may recommend screening at an earlier age if you have risk factors for colon cancer. On a yearly basis, your health care provider may provide home test kits to check for hidden blood in the stool. Use of a small camera at the end of a tube to directly examine the colon (sigmoidoscopy or colonoscopy) can detect the earliest forms of colorectal cancer. Talk to your health care provider about this at age 50, when routine screening begins. Direct exam of the colon should be repeated every 5-10 years through age 75, unless early forms of precancerous polyps or small growths are found.  People who are at an increased risk for hepatitis B should be screened for this virus. You are considered at high risk for hepatitis B if:  You were born in a country where hepatitis B occurs often. Talk with your health care provider about which countries are considered high risk.  Your parents were born in a high-risk country and you have not received a shot to protect against hepatitis B (hepatitis B vaccine).  You have HIV or AIDS.  You use needles to inject street drugs.  You live with, or have sex with, someone who has hepatitis B.  You are a man who has sex with other men (MSM).  You get hemodialysis  treatment.  You take certain medicines for conditions such as cancer, organ transplantation, and autoimmune conditions.  Hepatitis C blood testing is recommended for all people born from 1945 through 1965 and any individual with known risks for hepatitis C.  Practice safe sex. Use condoms and avoid high-risk sexual practices to reduce the spread of sexually transmitted infections (STIs). STIs include gonorrhea, chlamydia, syphilis, trichomonas, herpes, HPV, and human immunodeficiency virus (HIV). Herpes, HIV, and HPV are viral illnesses that have no cure. They can result in disability, cancer, and death.  If you are at risk of being infected with HIV, it is recommended that you take a prescription medicine daily to prevent HIV infection. This is called preexposure prophylaxis (PrEP). You are considered at risk if:  You are a man who has sex with other men (MSM) and have other risk factors.  You are a heterosexual man, are sexually active, and are at increased risk for HIV infection.    You take drugs by injection.  You are sexually active with a partner who has HIV.  Talk with your health care provider about whether you are at high risk of being infected with HIV. If you choose to begin PrEP, you should first be tested for HIV. You should then be tested every 3 months for as long as you are taking PrEP.  A one-time screening for abdominal aortic aneurysm (AAA) and surgical repair of large AAAs by ultrasound are recommended for men ages 32 to 67 years who are current or former smokers.  Healthy men should no longer receive prostate-specific antigen (PSA) blood tests as part of routine cancer screening. Talk with your health care provider about prostate cancer screening.  Testicular cancer screening is not recommended for adult males who have no symptoms. Screening includes self-exam, a health care provider exam, and other screening tests. Consult with your health care provider about any symptoms  you have or any concerns you have about testicular cancer.  Use sunscreen. Apply sunscreen liberally and repeatedly throughout the day. You should seek shade when your shadow is shorter than you. Protect yourself by wearing long sleeves, pants, a wide-brimmed hat, and sunglasses year round, whenever you are outdoors.  Once a month, do a whole-body skin exam, using a mirror to look at the skin on your back. Tell your health care provider about new moles, moles that have irregular borders, moles that are larger than a pencil eraser, or moles that have changed in shape or color.  Stay current with required vaccines (immunizations).  Influenza vaccine. All adults should be immunized every year.  Tetanus, diphtheria, and acellular pertussis (Td, Tdap) vaccine. An adult who has not previously received Tdap or who does not know his vaccine status should receive 1 dose of Tdap. This initial dose should be followed by tetanus and diphtheria toxoids (Td) booster doses every 10 years. Adults with an unknown or incomplete history of completing a 3-dose immunization series with Td-containing vaccines should begin or complete a primary immunization series including a Tdap dose. Adults should receive a Td booster every 10 years.  Varicella vaccine. An adult without evidence of immunity to varicella should receive 2 doses or a second dose if he has previously received 1 dose.  Human papillomavirus (HPV) vaccine. Males aged 68-21 years who have not received the vaccine previously should receive the 3-dose series. Males aged 22-26 years may be immunized. Immunization is recommended through the age of 6 years for any male who has sex with males and did not get any or all doses earlier. Immunization is recommended for any person with an immunocompromised condition through the age of 49 years if he did not get any or all doses earlier. During the 3-dose series, the second dose should be obtained 4-8 weeks after the first  dose. The third dose should be obtained 24 weeks after the first dose and 16 weeks after the second dose.  Zoster vaccine. One dose is recommended for adults aged 50 years or older unless certain conditions are present.  Measles, mumps, and rubella (MMR) vaccine. Adults born before [birth year] generally are considered immune to measles and mumps. Adults born in [birth year] or later should have 1 or more doses of MMR vaccine unless there is a contraindication to the vaccine or there is laboratory evidence of immunity to each of the three diseases. A routine second dose of MMR vaccine should be obtained at least 28 days after the first dose for students attending postsecondary  schools, health care workers, or international travelers. People who received inactivated measles vaccine or an unknown type of measles vaccine during 1963-1967 should receive 2 doses of MMR vaccine. People who received inactivated mumps vaccine or an unknown type of mumps vaccine before 1979 and are at high risk for mumps infection should consider immunization with 2 doses of MMR vaccine. Unvaccinated health care workers born before 65 who lack laboratory evidence of measles, mumps, or rubella immunity or laboratory confirmation of disease should consider measles and mumps immunization with 2 doses of MMR vaccine or rubella immunization with 1 dose of MMR vaccine.  Pneumococcal 13-valent conjugate (PCV13) vaccine. When indicated, a person who is uncertain of his immunization history and has no record of immunization should receive the PCV13 vaccine. An adult aged 60 years or older who has certain medical conditions and has not been previously immunized should receive 1 dose of PCV13 vaccine. This PCV13 should be followed with a dose of pneumococcal polysaccharide (PPSV23) vaccine. The PPSV23 vaccine dose should be obtained at least 8 weeks after the dose of PCV13 vaccine. An adult aged 52 years or older who has certain medical conditions and  previously received 1 or more doses of PPSV23 vaccine should receive 1 dose of PCV13. The PCV13 vaccine dose should be obtained 1 or more years after the last PPSV23 vaccine dose.  Pneumococcal polysaccharide (PPSV23) vaccine. When PCV13 is also indicated, PCV13 should be obtained first. All adults aged 69 years and older should be immunized. An adult younger than age 66 years who has certain medical conditions should be immunized. Any person who resides in a nursing home or long-term care facility should be immunized. An adult smoker should be immunized. People with an immunocompromised condition and certain other conditions should receive both PCV13 and PPSV23 vaccines. People with human immunodeficiency virus (HIV) infection should be immunized as soon as possible after diagnosis. Immunization during chemotherapy or radiation therapy should be avoided. Routine use of PPSV23 vaccine is not recommended for American Indians, [place] Natives, or people younger than 65 years unless there are medical conditions that require PPSV23 vaccine. When indicated, people who have unknown immunization and have no record of immunization should receive PPSV23 vaccine. One-time revaccination 5 years after the first dose of PPSV23 is recommended for people aged 19-64 years who have chronic kidney failure, nephrotic syndrome, asplenia, or immunocompromised conditions. People who received 1-2 doses of PPSV23 before age 46 years should receive another dose of PPSV23 vaccine at age 1 years or later if at least 5 years have passed since the previous dose. Doses of PPSV23 are not needed for people immunized with PPSV23 at or after age 66 years.  Meningococcal vaccine. Adults with asplenia or persistent complement component deficiencies should receive 2 doses of quadrivalent meningococcal conjugate (MenACWY-D) vaccine. The doses should be obtained at least 2 months apart. Microbiologists working with certain meningococcal bacteria,  [employer], people at risk during an outbreak, and people who travel to or live in countries with a high rate of meningitis should be immunized. A first-year college student up through age 2 years who is living in a residence hall should receive a dose if he did not receive a dose on or after his 16th birthday. Adults who have certain high-risk conditions should receive one or more doses of vaccine.  Hepatitis A vaccine. Adults who wish to be protected from this disease, have certain high-risk conditions, work with hepatitis A-infected animals, work in hepatitis A research labs, or  travel to or work in countries with a high rate of hepatitis A should be immunized. Adults who were previously unvaccinated and who anticipate close contact with an international adoptee during the first 60 days after arrival in the [place] from a country with a high rate of hepatitis A should be immunized.  Hepatitis B vaccine. Adults should be immunized if they wish to be protected from this disease, have certain high-risk conditions, may be exposed to blood or other infectious body fluids, are household contacts or sex partners of hepatitis B positive people, are clients or workers in certain care facilities, or travel to or work in countries with a high rate of hepatitis B.  Haemophilus influenzae type b (Hib) vaccine. A previously unvaccinated person with asplenia or sickle cell disease or having a scheduled splenectomy should receive 1 dose of Hib vaccine. Regardless of previous immunization, a recipient of a hematopoietic stem cell transplant should receive a 3-dose series 6-12 months after his successful transplant. Hib vaccine is not recommended for adults with HIV infection. Preventive Service / Frequency Ages 46 to 48  Blood pressure check.** / Every 1 to 2 years.  Lipid and cholesterol check.** / Every 5 years beginning at age 31.  Hepatitis C blood test.** / For any individual with known risks for  hepatitis C.  Skin self-exam. / Monthly.  Influenza vaccine. / Every year.  Tetanus, diphtheria, and acellular pertussis (Tdap, Td) vaccine.** / Consult your health care provider. 1 dose of Td every 10 years.  Varicella vaccine.** / Consult your health care provider.  HPV vaccine. / 3 doses over 6 months, if 45 or younger.  Measles, mumps, rubella (MMR) vaccine.** / You need at least 1 dose of MMR if you were born in 1957 or later. You may also need a second dose.  Pneumococcal 13-valent conjugate (PCV13) vaccine.** / Consult your health care provider.  Pneumococcal polysaccharide (PPSV23) vaccine.** / 1 to 2 doses if you smoke cigarettes or if you have certain conditions.  Meningococcal vaccine.** / 1 dose if you are age 58 to 74 years and a Market researcher living in a residence hall, or have one of several medical conditions. You may also need additional booster doses.  Hepatitis A vaccine.** / Consult your health care provider.  Hepatitis B vaccine.** / Consult your health care provider.  Haemophilus influenzae type b (Hib) vaccine.** / Consult your health care provider. Ages 61 to 45  Blood pressure check.** / Every 1 to 2 years.  Lipid and cholesterol check.** / Every 5 years beginning at age 104.  Lung cancer screening. / Every year if you are aged 30-80 years and have a 30-pack-year history of smoking and currently smoke or have quit within the past 15 years. Yearly screening is stopped once you have quit smoking for at least 15 years or develop a health problem that would prevent you from having lung cancer treatment.  Fecal occult blood test (FOBT) of stool. / Every year beginning at age 72 and continuing until age 55. You may not have to do this test if you get a colonoscopy every 10 years.  Flexible sigmoidoscopy** or colonoscopy.** / Every 5 years for a flexible sigmoidoscopy or every 10 years for a colonoscopy beginning at age 79 and continuing until age  30.  Hepatitis C blood test.** / For all people born from [birth year] through 1965 and any individual with known risks for hepatitis C.  Skin self-exam. / Monthly.  Influenza vaccine. / Every  year.  Tetanus, diphtheria, and acellular pertussis (Tdap/Td) vaccine.** / Consult your health care provider. 1 dose of Td every 10 years.  Varicella vaccine.** / Consult your health care provider.  Zoster vaccine.** / 1 dose for adults aged 58 years or older.  Measles, mumps, rubella (MMR) vaccine.** / You need at least 1 dose of MMR if you were born in 1957 or later. You may also need a second dose.  Pneumococcal 13-valent conjugate (PCV13) vaccine.** / Consult your health care provider.  Pneumococcal polysaccharide (PPSV23) vaccine.** / 1 to 2 doses if you smoke cigarettes or if you have certain conditions.  Meningococcal vaccine.** / Consult your health care provider.  Hepatitis A vaccine.** / Consult your health care provider.  Hepatitis B vaccine.** / Consult your health care provider.  Haemophilus influenzae type b (Hib) vaccine.** / Consult your health care provider. Ages 68 and over  Blood pressure check.** / Every 1 to 2 years.  Lipid and cholesterol check.**/ Every 5 years beginning at age 70.  Lung cancer screening. / Every year if you are aged 38-80 years and have a 30-pack-year history of smoking and currently smoke or have quit within the past 15 years. Yearly screening is stopped once you have quit smoking for at least 15 years or develop a health problem that would prevent you from having lung cancer treatment.  Fecal occult blood test (FOBT) of stool. / Every year beginning at age 32 and continuing until age 45. You may not have to do this test if you get a colonoscopy every 10 years.  Flexible sigmoidoscopy** or colonoscopy.** / Every 5 years for a flexible sigmoidoscopy or every 10 years for a colonoscopy beginning at age 37 and continuing until age 41.  Hepatitis C blood  test.** / For all people born from [birth year] through 1965 and any individual with known risks for hepatitis C.  Abdominal aortic aneurysm (AAA) screening.** / A one-time screening for ages 79 to 81 years who are current or former smokers.  Skin self-exam. / Monthly.  Influenza vaccine. / Every year.  Tetanus, diphtheria, and acellular pertussis (Tdap/Td) vaccine.** / 1 dose of Td every 10 years.  Varicella vaccine.** / Consult your health care provider.  Zoster vaccine.** / 1 dose for adults aged 29 years or older.  Pneumococcal 13-valent conjugate (PCV13) vaccine.** / Consult your health care provider.  Pneumococcal polysaccharide (PPSV23) vaccine.** / 1 dose for all adults aged 67 years and older.  Meningococcal vaccine.** / Consult your health care provider.  Hepatitis A vaccine.** / Consult your health care provider.  Hepatitis B vaccine.** / Consult your health care provider.  Haemophilus influenzae type b (Hib) vaccine.** / Consult your health care provider. **Family history and personal history of risk and conditions may change your health care provider's recommendations. Document Released: 12/13/2001 Document Revised: 10/22/2013 Document Reviewed: 03/14/2011 [facility] Patient Information 2015 [place], [place]. This information is not intended to replace advice given to you by your health care provider. Make sure you discuss any questions you have with your health care provider.

## 2015-02-11 NOTE — Patient Instructions (Signed)
Your physician wants you to follow-up in: 1 YEAR with Dr Cooper.  You will receive a reminder letter in the mail two months in advance. If you don't receive a letter, please call our office to schedule the follow-up appointment.  Your physician recommends that you continue on your current medications as directed. Please refer to the Current Medication list given to you today.  

## 2015-02-12 ENCOUNTER — Other Ambulatory Visit (INDEPENDENT_AMBULATORY_CARE_PROVIDER_SITE_OTHER): Payer: Medicare HMO

## 2015-02-12 DIAGNOSIS — E034 Atrophy of thyroid (acquired): Secondary | ICD-10-CM

## 2015-02-12 DIAGNOSIS — Z0000 Encounter for general adult medical examination without abnormal findings: Secondary | ICD-10-CM | POA: Diagnosis not present

## 2015-02-12 DIAGNOSIS — N320 Bladder-neck obstruction: Secondary | ICD-10-CM | POA: Diagnosis not present

## 2015-02-12 DIAGNOSIS — I252 Old myocardial infarction: Secondary | ICD-10-CM | POA: Diagnosis not present

## 2015-02-12 DIAGNOSIS — E038 Other specified hypothyroidism: Secondary | ICD-10-CM | POA: Diagnosis not present

## 2015-02-12 DIAGNOSIS — I10 Essential (primary) hypertension: Secondary | ICD-10-CM | POA: Diagnosis not present

## 2015-02-12 LAB — HEPATIC FUNCTION PANEL
ALT: 14 U/L (ref 0–53)
AST: 19 U/L (ref 0–37)
Albumin: 3.9 g/dL (ref 3.5–5.2)
Alkaline Phosphatase: 49 U/L (ref 39–117)
Bilirubin, Direct: 0.2 mg/dL (ref 0.0–0.3)
TOTAL PROTEIN: 6.1 g/dL (ref 6.0–8.3)
Total Bilirubin: 0.8 mg/dL (ref 0.2–1.2)

## 2015-02-12 LAB — CBC WITH DIFFERENTIAL/PLATELET
BASOS ABS: 0 10*3/uL (ref 0.0–0.1)
Basophils Relative: 0.6 % (ref 0.0–3.0)
EOS ABS: 0.2 10*3/uL (ref 0.0–0.7)
Eosinophils Relative: 3.6 % (ref 0.0–5.0)
HCT: 42.2 % (ref 39.0–52.0)
Hemoglobin: 13.8 g/dL (ref 13.0–17.0)
Lymphocytes Relative: 22.2 % (ref 12.0–46.0)
Lymphs Abs: 1.1 10*3/uL (ref 0.7–4.0)
MCHC: 32.8 g/dL (ref 30.0–36.0)
MCV: 82.3 fl (ref 78.0–100.0)
MONO ABS: 0.4 10*3/uL (ref 0.1–1.0)
Monocytes Relative: 9.1 % (ref 3.0–12.0)
Neutro Abs: 3.1 10*3/uL (ref 1.4–7.7)
Neutrophils Relative %: 64.5 % (ref 43.0–77.0)
PLATELETS: 122 10*3/uL — AB (ref 150.0–400.0)
RBC: 5.13 Mil/uL (ref 4.22–5.81)
RDW: 16.1 % — AB (ref 11.5–15.5)
WBC: 4.7 10*3/uL (ref 4.0–10.5)

## 2015-02-12 LAB — URINALYSIS
BILIRUBIN URINE: NEGATIVE
Hgb urine dipstick: NEGATIVE
Ketones, ur: NEGATIVE
Leukocytes, UA: NEGATIVE
Nitrite: NEGATIVE
Specific Gravity, Urine: 1.015 (ref 1.000–1.030)
Total Protein, Urine: NEGATIVE
URINE GLUCOSE: NEGATIVE
UROBILINOGEN UA: 0.2 (ref 0.0–1.0)
pH: 7.5 (ref 5.0–8.0)

## 2015-02-12 LAB — BASIC METABOLIC PANEL
BUN: 13 mg/dL (ref 6–23)
CHLORIDE: 104 meq/L (ref 96–112)
CO2: 33 meq/L — AB (ref 19–32)
Calcium: 8.8 mg/dL (ref 8.4–10.5)
Creatinine, Ser: 1.95 mg/dL — ABNORMAL HIGH (ref 0.40–1.50)
GFR: 35.98 mL/min — ABNORMAL LOW (ref >60.00)
Glucose, Bld: 82 mg/dL (ref 70–99)
POTASSIUM: 4.9 meq/L (ref 3.5–5.1)
Sodium: 141 mEq/L (ref 135–145)

## 2015-02-12 LAB — LIPID PANEL
CHOLESTEROL: 131 mg/dL (ref 0–200)
HDL: 37.5 mg/dL — ABNORMAL LOW (ref >39.00)
LDL CALC: 74 mg/dL (ref 0–99)
NonHDL: 93.5
Total CHOL/HDL Ratio: 3
Triglycerides: 98 mg/dL (ref 0.0–149.0)
VLDL: 19.6 mg/dL (ref 0.0–40.0)

## 2015-02-12 LAB — TSH: TSH: 6.02 u[IU]/mL — ABNORMAL HIGH (ref 0.35–4.50)

## 2015-02-12 LAB — PSA: PSA: 2.51 ng/mL (ref 0.10–4.00)

## 2015-02-12 LAB — HEMOGLOBIN A1C: Hgb A1c MFr Bld: 5.8 % (ref 4.6–6.5)

## 2015-02-12 MED ORDER — LEVOTHYROXINE SODIUM 150 MCG PO TABS: 150.0000 ug | ORAL_TABLET | Freq: Every day | ORAL | Status: DC

## 2015-02-13 ENCOUNTER — Encounter: Payer: Self-pay | Admitting: Internal Medicine

## 2015-02-18 ENCOUNTER — Encounter: Payer: Self-pay | Admitting: Internal Medicine

## 2015-02-19 ENCOUNTER — Other Ambulatory Visit: Payer: Self-pay | Admitting: Internal Medicine

## 2015-02-19 DIAGNOSIS — N183 Chronic kidney disease, stage 3 unspecified: Secondary | ICD-10-CM

## 2015-04-15 ENCOUNTER — Encounter: Payer: Self-pay | Admitting: Gastroenterology

## 2015-04-16 ENCOUNTER — Other Ambulatory Visit: Payer: Self-pay | Admitting: Internal Medicine

## 2015-06-08 ENCOUNTER — Encounter: Payer: Self-pay | Admitting: Internal Medicine

## 2015-06-15 ENCOUNTER — Ambulatory Visit (INDEPENDENT_AMBULATORY_CARE_PROVIDER_SITE_OTHER): Payer: Medicare HMO | Admitting: Internal Medicine

## 2015-06-15 ENCOUNTER — Encounter: Payer: Self-pay | Admitting: Internal Medicine

## 2015-06-15 ENCOUNTER — Other Ambulatory Visit: Payer: Self-pay | Admitting: Internal Medicine

## 2015-06-15 ENCOUNTER — Other Ambulatory Visit (INDEPENDENT_AMBULATORY_CARE_PROVIDER_SITE_OTHER): Payer: Medicare HMO

## 2015-06-15 VITALS — BP 139/72 | HR 69 | Wt 216.0 lb

## 2015-06-15 DIAGNOSIS — R200 Anesthesia of skin: Secondary | ICD-10-CM

## 2015-06-15 DIAGNOSIS — I10 Essential (primary) hypertension: Secondary | ICD-10-CM

## 2015-06-15 DIAGNOSIS — N3000 Acute cystitis without hematuria: Secondary | ICD-10-CM | POA: Diagnosis not present

## 2015-06-15 DIAGNOSIS — R350 Frequency of micturition: Secondary | ICD-10-CM

## 2015-06-15 DIAGNOSIS — R972 Elevated prostate specific antigen [PSA]: Secondary | ICD-10-CM

## 2015-06-15 DIAGNOSIS — R3129 Other microscopic hematuria: Secondary | ICD-10-CM

## 2015-06-15 DIAGNOSIS — R202 Paresthesia of skin: Secondary | ICD-10-CM

## 2015-06-15 DIAGNOSIS — K429 Umbilical hernia without obstruction or gangrene: Secondary | ICD-10-CM

## 2015-06-15 DIAGNOSIS — N390 Urinary tract infection, site not specified: Secondary | ICD-10-CM | POA: Insufficient documentation

## 2015-06-15 LAB — URINALYSIS, ROUTINE W REFLEX MICROSCOPIC
BILIRUBIN URINE: NEGATIVE
KETONES UR: NEGATIVE
LEUKOCYTES UA: NEGATIVE
NITRITE: NEGATIVE
PH: 7 (ref 5.0–8.0)
SPECIFIC GRAVITY, URINE: 1.02 (ref 1.000–1.030)
URINE GLUCOSE: NEGATIVE
UROBILINOGEN UA: 0.2 (ref 0.0–1.0)

## 2015-06-15 LAB — VITAMIN B12: VITAMIN B 12: 741 pg/mL (ref 211–911)

## 2015-06-15 LAB — CBC WITH DIFFERENTIAL/PLATELET
BASOS ABS: 0 10*3/uL (ref 0.0–0.1)
Basophils Relative: 0.7 % (ref 0.0–3.0)
EOS PCT: 3.4 % (ref 0.0–5.0)
Eosinophils Absolute: 0.2 10*3/uL (ref 0.0–0.7)
HCT: 45.5 % (ref 39.0–52.0)
HEMOGLOBIN: 15 g/dL (ref 13.0–17.0)
LYMPHS ABS: 1.2 10*3/uL (ref 0.7–4.0)
Lymphocytes Relative: 21.2 % (ref 12.0–46.0)
MCHC: 33 g/dL (ref 30.0–36.0)
MCV: 85.1 fl (ref 78.0–100.0)
MONO ABS: 0.6 10*3/uL (ref 0.1–1.0)
MONOS PCT: 10.8 % (ref 3.0–12.0)
NEUTROS PCT: 63.9 % (ref 43.0–77.0)
Neutro Abs: 3.6 10*3/uL (ref 1.4–7.7)
Platelets: 112 10*3/uL — ABNORMAL LOW (ref 150.0–400.0)
RBC: 5.34 Mil/uL (ref 4.22–5.81)
RDW: 18.6 % — ABNORMAL HIGH (ref 11.5–15.5)
WBC: 5.6 10*3/uL (ref 4.0–10.5)

## 2015-06-15 LAB — BASIC METABOLIC PANEL
BUN: 17 mg/dL (ref 6–23)
CHLORIDE: 105 meq/L (ref 96–112)
CO2: 33 meq/L — AB (ref 19–32)
CREATININE: 1.9 mg/dL — AB (ref 0.40–1.50)
Calcium: 9.6 mg/dL (ref 8.4–10.5)
GFR: 37.04 mL/min — ABNORMAL LOW (ref >60.00)
Glucose, Bld: 74 mg/dL (ref 70–99)
Potassium: 4.7 mEq/L (ref 3.5–5.1)
SODIUM: 144 meq/L (ref 135–145)

## 2015-06-15 LAB — SEDIMENTATION RATE: SED RATE: 5 mm/h (ref 0–22)

## 2015-06-15 MED ORDER — CIPROFLOXACIN HCL 500 MG PO TABS: 500.0000 mg | ORAL_TABLET | Freq: Two times a day (BID) | ORAL | Status: DC

## 2015-06-15 NOTE — Assessment & Plan Note (Signed)
8/16 Midline hernia, umbilical hernia Discussed

## 2015-06-15 NOTE — Progress Notes (Signed)
Pre visit review using our clinic review tool, if applicable. No additional management support is needed unless otherwise documented below in the visit note. 

## 2015-06-15 NOTE — Assessment & Plan Note (Signed)
Neuropathy ?[facility]

## 2015-06-15 NOTE — Assessment & Plan Note (Signed)
PSA

## 2015-06-15 NOTE — Progress Notes (Signed)
Subjective:  Patient ID: Scott Oneal, male    DOB: December 05, 1940  Age: 74 y.o. MRN: [id number]  CC: No chief complaint on file.   HPI Scott Oneal presents for CAD, HTN, dyslipidemia C/o UTI sx's x 1 week C/o feet numbness in am x months/years "My stomach is getting bigger"   Outpatient Prescriptions Prior to Visit  Medication Sig Dispense Refill  . albuterol (PROAIR HFA) 108 (90 BASE) MCG/ACT inhaler Inhale 2 puffs into the lungs every 6 (six) hours as needed for wheezing or shortness of breath. 1 Inhaler 3  . aspirin 81 MG tablet Take 81 mg by mouth daily.    . budesonide-formoterol (SYMBICORT) 160-4.5 MCG/ACT inhaler Inhale 2 puffs into the lungs 2 (two) times daily. 2 Inhaler 6  . calcitRIOL (ROCALTROL) 0.25 MCG capsule TAKE ONE CAPSULE BY MOUTH DAILY 90 capsule 0  . carvedilol (COREG) 25 MG tablet TAKE 1 TABLET BY MOUTH TWICE A DAY WITH A MEAL 180 tablet 3  . clonazePAM (KLONOPIN) 0.25 MG disintegrating tablet Take 1 tablet (0.25 mg total) by mouth 2 (two) times daily as needed (anxiety). 60 tablet 1  . clopidogrel (PLAVIX) 75 MG tablet TAKE 1 TABLET BY MOUTH EVERY DAY 90 tablet 0  . fish oil-omega-3 fatty acids 1000 MG capsule Take 1 g by mouth daily.      Marland Kitchen levothyroxine (SYNTHROID, LEVOTHROID) 150 MCG tablet Take 1 tablet (150 mcg total) by mouth daily. 90 tablet 3  . nitroGLYCERIN (NITROSTAT) 0.4 MG SL tablet Place 1 tablet (0.4 mg total) under the tongue every 5 (five) minutes as needed. 20 tablet 5  . olmesartan (BENICAR) 20 MG tablet Take 1 tablet (20 mg total) by mouth daily. 90 tablet 3  . sildenafil (VIAGRA) 100 MG tablet Take 100 mg by mouth daily as needed.      . simvastatin (ZOCOR) 10 MG tablet TAKE 1 TABLET BY MOUTH AT BEDTIME 90 tablet 0  . testosterone cypionate (DEPOTESTOSTERONE CYPIONATE) 200 MG/ML injection INJECT 2 MLS IN THE MUSCLE EVERY 2 WEEKS 10 mL 0   No facility-administered medications prior to visit.    ROS Review of Systems  Constitutional:  Negative for appetite change, fatigue and unexpected weight change.  HENT: Negative for congestion, nosebleeds, sneezing, sore throat and trouble swallowing.   Eyes: Negative for itching and visual disturbance.  Respiratory: Negative for cough.   Cardiovascular: Negative for chest pain, palpitations and leg swelling.  Gastrointestinal: Negative for nausea, diarrhea, blood in stool and abdominal distention.  Genitourinary: Positive for dysuria and frequency. Negative for hematuria.  Musculoskeletal: Negative for back pain, joint swelling, gait problem and neck pain.  Skin: Negative for rash.  Neurological: Positive for numbness. Negative for dizziness, tremors, speech difficulty and weakness.  Psychiatric/Behavioral: Negative for suicidal ideas, sleep disturbance, dysphoric mood and agitation. The patient is not nervous/anxious.     Objective:  BP 139/72 mmHg  Pulse 69  Wt 216 lb (97.977 kg)  SpO2 97%  BP Readings from Last 3 Encounters:  06/15/15 139/72  02/11/15 122/82  02/11/15 148/80    Wt Readings from Last 3 Encounters:  06/15/15 216 lb (97.977 kg)  02/11/15 221 lb 6.4 oz (100.426 kg)  02/11/15 222 lb (100.699 kg)    Physical Exam  Constitutional: He is oriented to person, place, and time. He appears well-developed. No distress.  NAD  HENT:  Mouth/Throat: Oropharynx is clear and moist.  Eyes: Conjunctivae are normal. Pupils are equal, round, and reactive to light.  Neck:  Normal range of motion. No JVD present. No thyromegaly present.  Cardiovascular: Normal rate, regular rhythm, normal heart sounds and intact distal pulses.  Exam reveals no gallop and no friction rub.   No murmur heard. Pulmonary/Chest: Effort normal and breath sounds normal. No respiratory distress. He has no wheezes. He has no rales. He exhibits no tenderness.  Abdominal: Soft. Bowel sounds are normal. He exhibits no distension and no mass. There is no tenderness. There is no rebound and no guarding.    Musculoskeletal: Normal range of motion. He exhibits no edema or tenderness.  Lymphadenopathy:    He has no cervical adenopathy.  Neurological: He is alert and oriented to person, place, and time. He has normal reflexes. No cranial nerve deficit. He exhibits normal muscle tone. He displays a negative Romberg sign. Coordination and gait normal.  Skin: Skin is warm and dry. No rash noted.  Psychiatric: He has a normal mood and affect. His behavior is normal. Judgment and thought content normal.  Midline hernia, umbilical hernia LE pulses ok  Lab Results  Component Value Date   WBC 4.7 02/12/2015   HGB 13.8 02/12/2015   HCT 42.2 02/12/2015   PLT 122.0* 02/12/2015   GLUCOSE 82 02/12/2015   CHOL 131 02/12/2015   TRIG 98.0 02/12/2015   HDL 37.50* 02/12/2015   LDLCALC 74 02/12/2015   ALT 14 02/12/2015   AST 19 02/12/2015   NA 141 02/12/2015   K 4.9 02/12/2015   CL 104 02/12/2015   CREATININE 1.95* 02/12/2015   BUN 13 02/12/2015   CO2 33* 02/12/2015   TSH 6.02* 02/12/2015   PSA 2.51 02/12/2015   INR 1.20 11/26/2014   HGBA1C 5.8 02/12/2015    No results found.  Assessment & Plan:   Diagnoses and all orders for this visit:  Urinary frequency -     Basic metabolic panel; Future -     Vitamin B12; Future -     Sedimentation rate; Future -     Urinalysis; Future -     CBC with Differential/Platelet; Future -     Protein electrophoresis, serum; Future  Acute cystitis without hematuria -     Basic metabolic panel; Future -     Vitamin B12; Future -     Sedimentation rate; Future -     Urinalysis; Future -     CBC with Differential/Platelet; Future -     Protein electrophoresis, serum; Future  Paresthesia -     Basic metabolic panel; Future -     Vitamin B12; Future -     Sedimentation rate; Future -     Urinalysis; Future -     CBC with Differential/Platelet; Future -     Protein electrophoresis, serum; Future  Essential hypertension -     Basic metabolic panel;  Future -     Vitamin B12; Future -     Sedimentation rate; Future -     Urinalysis; Future -     CBC with Differential/Platelet; Future -     Protein electrophoresis, serum; Future  Numbness -     Basic metabolic panel; Future -     Vitamin B12; Future -     Sedimentation rate; Future -     Urinalysis; Future -     CBC with Differential/Platelet; Future -     Protein electrophoresis, serum; Future  Umbilical hernia without obstruction and without gangrene -     Basic metabolic panel; Future -     Vitamin B12;  Future -     Sedimentation rate; Future -     Urinalysis; Future -     CBC with Differential/Platelet; Future -     Protein electrophoresis, serum; Future  PSA, INCREASED -     Basic metabolic panel; Future -     Vitamin B12; Future -     Sedimentation rate; Future -     Urinalysis; Future -     CBC with Differential/Platelet; Future -     Protein electrophoresis, serum; Future  I am having Mr. Blackard maintain his fish oil-omega-3 fatty acids, sildenafil, clonazePAM, albuterol, budesonide-formoterol, nitroGLYCERIN, carvedilol, olmesartan, aspirin, levothyroxine, calcitRIOL, simvastatin, clopidogrel, testosterone cypionate, and B-D 3CC LUER-LOK SYR 22GX1".  Meds ordered this encounter  Medications  . B-D 3CC LUER-LOK SYR 22GX1" 22G X 1" 3 ML MISC    Sig: every 14 (fourteen) days.    Refill:  58     Follow-up: Return in about 3 months (around 09/15/2015) for a follow-up visit.  Walker Kehr, MD

## 2015-06-15 NOTE — Assessment & Plan Note (Signed)
UA Cipro

## 2015-06-15 NOTE — Assessment & Plan Note (Signed)
Olmestartan, Coreg Risks associated with Benicar treatment noncompliance were discussed. Compliance was encouraged.

## 2015-06-15 NOTE — Assessment & Plan Note (Signed)
Labs

## 2015-06-17 ENCOUNTER — Encounter: Payer: Self-pay | Admitting: Internal Medicine

## 2015-06-17 LAB — PROTEIN ELECTROPHORESIS, SERUM
ALBUMIN ELP: 4 g/dL (ref 3.8–4.8)
Alpha-1-Globulin: 0.3 g/dL (ref 0.2–0.3)
Alpha-2-Globulin: 0.7 g/dL (ref 0.5–0.9)
Beta 2: 0.2 g/dL (ref 0.2–0.5)
Beta Globulin: 0.5 g/dL (ref 0.4–0.6)
Gamma Globulin: 0.7 g/dL — ABNORMAL LOW (ref 0.8–1.7)
TOTAL PROTEIN, SERUM ELECTROPHOR: 6.3 g/dL (ref 6.1–8.1)

## 2015-06-23 ENCOUNTER — Encounter: Payer: Self-pay | Admitting: Internal Medicine

## 2015-07-22 ENCOUNTER — Other Ambulatory Visit: Payer: Self-pay | Admitting: *Deleted

## 2015-07-22 MED ORDER — CALCITRIOL 0.25 MCG PO CAPS: 0.2500 ug | ORAL_CAPSULE | Freq: Every day | ORAL | Status: DC

## 2015-07-22 MED ORDER — CLOPIDOGREL BISULFATE 75 MG PO TABS: 75.0000 mg | ORAL_TABLET | Freq: Every day | ORAL | Status: DC

## 2015-07-29 ENCOUNTER — Telehealth: Payer: Self-pay | Admitting: *Deleted

## 2015-07-29 MED ORDER — LEVOTHYROXINE SODIUM 150 MCG PO TABS: 150.0000 ug | ORAL_TABLET | Freq: Every day | ORAL | Status: DC

## 2015-07-29 NOTE — Telephone Encounter (Signed)
Pt states that [facility] has sent request 9/19 for refill for his levothyroxine, and they still not receive back. Inform pt per chart no request has came through system pharmacy know to send request electronically & not fax. Inform pt will send electronically...Scott Oneal

## 2015-08-05 ENCOUNTER — Other Ambulatory Visit: Payer: Self-pay

## 2015-08-05 MED ORDER — SIMVASTATIN 10 MG PO TABS: 10.0000 mg | ORAL_TABLET | Freq: Every day | ORAL | Status: DC

## 2015-08-07 ENCOUNTER — Ambulatory Visit (INDEPENDENT_AMBULATORY_CARE_PROVIDER_SITE_OTHER): Payer: Medicare HMO

## 2015-08-07 ENCOUNTER — Other Ambulatory Visit: Payer: Self-pay | Admitting: Internal Medicine

## 2015-08-07 DIAGNOSIS — Z23 Encounter for immunization: Secondary | ICD-10-CM

## 2015-08-10 MED ORDER — TESTOSTERONE CYPIONATE 200 MG/ML IM SOLN: INTRAMUSCULAR | Status: DC

## 2015-08-10 NOTE — Addendum Note (Signed)
Addended by: Earnstine Regal on: 08/10/2015 01:23 PM   Modules accepted: Orders

## 2015-08-10 NOTE — Telephone Encounter (Signed)
Reprinted script & fax to [facility]...Scott Oneal

## 2015-08-23 ENCOUNTER — Encounter: Payer: Self-pay | Admitting: Internal Medicine

## 2015-08-24 ENCOUNTER — Other Ambulatory Visit: Payer: Self-pay | Admitting: Internal Medicine

## 2015-08-24 MED ORDER — PREDNISONE 10 MG PO TABS: ORAL_TABLET | ORAL | Status: DC

## 2015-08-27 ENCOUNTER — Other Ambulatory Visit: Payer: Self-pay | Admitting: *Deleted

## 2015-08-27 MED ORDER — "BD LUER-LOK SYRINGE 22G X 1"" 3 ML MISC": 1.0000 | Status: DC

## 2015-08-28 ENCOUNTER — Other Ambulatory Visit: Payer: Self-pay

## 2015-08-28 NOTE — Telephone Encounter (Signed)
A user error has taken place.

## 2015-09-15 ENCOUNTER — Ambulatory Visit (INDEPENDENT_AMBULATORY_CARE_PROVIDER_SITE_OTHER): Payer: Medicare HMO | Admitting: Internal Medicine

## 2015-09-15 ENCOUNTER — Encounter: Payer: Self-pay | Admitting: Internal Medicine

## 2015-09-15 VITALS — BP 148/76 | HR 77 | Wt 222.0 lb

## 2015-09-15 DIAGNOSIS — R252 Cramp and spasm: Secondary | ICD-10-CM | POA: Insufficient documentation

## 2015-09-15 DIAGNOSIS — H9313 Tinnitus, bilateral: Secondary | ICD-10-CM

## 2015-09-15 DIAGNOSIS — I10 Essential (primary) hypertension: Secondary | ICD-10-CM

## 2015-09-15 DIAGNOSIS — I2510 Atherosclerotic heart disease of native coronary artery without angina pectoris: Secondary | ICD-10-CM

## 2015-09-15 DIAGNOSIS — H9319 Tinnitus, unspecified ear: Secondary | ICD-10-CM | POA: Insufficient documentation

## 2015-09-15 MED ORDER — NITROGLYCERIN 0.4 MG SL SUBL: 0.4000 mg | SUBLINGUAL_TABLET | SUBLINGUAL | Status: DC | PRN

## 2015-09-15 MED ORDER — GABAPENTIN 100 MG PO CAPS: ORAL_CAPSULE | ORAL | Status: DC

## 2015-09-15 NOTE — Progress Notes (Signed)
Pre visit review using our clinic review tool, if applicable. No additional management support is needed unless otherwise documented below in the visit note. 

## 2015-09-15 NOTE — Progress Notes (Signed)
Subjective:  Patient ID: Scott Oneal, male    DOB: 07/20/1941  Age: 74 y.o. MRN: [id number]  CC: No chief complaint on file.   HPI Scott Oneal presents for HTN, dyslipidemia, hypogonadism, CRI f/u  Outpatient Prescriptions Prior to Visit  Medication Sig Dispense Refill  . albuterol (PROAIR HFA) 108 (90 BASE) MCG/ACT inhaler Inhale 2 puffs into the lungs every 6 (six) hours as needed for wheezing or shortness of breath. 1 Inhaler 3  . aspirin 81 MG tablet Take 81 mg by mouth daily.    . B-D 3CC LUER-LOK SYR 22GX1" 22G X 1" 3 ML MISC Inject 1 each into the muscle every 14 (fourteen) days. 10 each 3  . budesonide-formoterol (SYMBICORT) 160-4.5 MCG/ACT inhaler Inhale 2 puffs into the lungs 2 (two) times daily. 2 Inhaler 6  . calcitRIOL (ROCALTROL) 0.25 MCG capsule Take 1 capsule (0.25 mcg total) by mouth daily. 90 capsule 3  . carvedilol (COREG) 25 MG tablet TAKE 1 TABLET BY MOUTH TWICE A DAY WITH A MEAL 180 tablet 3  . clonazePAM (KLONOPIN) 0.25 MG disintegrating tablet Take 1 tablet (0.25 mg total) by mouth 2 (two) times daily as needed (anxiety). 60 tablet 1  . clopidogrel (PLAVIX) 75 MG tablet Take 1 tablet (75 mg total) by mouth daily. 90 tablet 3  . fish oil-omega-3 fatty acids 1000 MG capsule Take 1 g by mouth daily.      Marland Kitchen levothyroxine (SYNTHROID, LEVOTHROID) 150 MCG tablet Take 1 tablet (150 mcg total) by mouth daily. 90 tablet 3  . nitroGLYCERIN (NITROSTAT) 0.4 MG SL tablet Place 1 tablet (0.4 mg total) under the tongue every 5 (five) minutes as needed. 20 tablet 5  . olmesartan (BENICAR) 20 MG tablet Take 1 tablet (20 mg total) by mouth daily. 90 tablet 3  . sildenafil (VIAGRA) 100 MG tablet Take 100 mg by mouth daily as needed.      . simvastatin (ZOCOR) 10 MG tablet Take 1 tablet (10 mg total) by mouth at bedtime. 90 tablet 1  . testosterone cypionate (DEPOTESTOSTERONE CYPIONATE) 200 MG/ML injection INJECT 2 ML INTO THE MUSCLE EVERY 2 WEEKS. 10 mL 5  . predniSONE  (DELTASONE) 10 MG tablet Prednisone 10 mg: take 4 tabs a day x 2 days; then 3 tabs a day x 2 days; then 2 tabs a day x 2 days,  then stop. Take pc prn gout 40 tablet 0  . ciprofloxacin (CIPRO) 500 MG tablet Take 1 tablet (500 mg total) by mouth 2 (two) times daily. (Patient not taking: Reported on 09/15/2015) 20 tablet 0   No facility-administered medications prior to visit.    ROS Review of Systems  Constitutional: Negative for appetite change, fatigue and unexpected weight change.  HENT: Negative for congestion, nosebleeds, sneezing, sore throat and trouble swallowing.   Eyes: Negative for itching and visual disturbance.  Respiratory: Negative for cough.   Cardiovascular: Negative for chest pain, palpitations and leg swelling.  Gastrointestinal: Negative for nausea, diarrhea, blood in stool and abdominal distention.  Genitourinary: Negative for frequency and hematuria.  Musculoskeletal: Negative for back pain, joint swelling, gait problem and neck pain.  Skin: Negative for rash.  Neurological: Negative for dizziness, tremors, speech difficulty and weakness.  Psychiatric/Behavioral: Negative for sleep disturbance, dysphoric mood and agitation. The patient is not nervous/anxious.     Objective:  BP 148/76 mmHg  Pulse 77  Wt 222 lb (100.699 kg)  SpO2 97%  BP Readings from Last 3 Encounters:  09/15/15 148/76  06/15/15 139/72  02/11/15 122/82    Wt Readings from Last 3 Encounters:  09/15/15 222 lb (100.699 kg)  06/15/15 216 lb (97.977 kg)  02/11/15 221 lb 6.4 oz (100.426 kg)    Physical Exam  Constitutional: He is oriented to person, place, and time. He appears well-developed. No distress.  NAD  HENT:  Mouth/Throat: Oropharynx is clear and moist.  Eyes: Conjunctivae are normal. Pupils are equal, round, and reactive to light.  Neck: Normal range of motion. No JVD present. No thyromegaly present.  Cardiovascular: Normal rate, regular rhythm, normal heart sounds and intact  distal pulses.  Exam reveals no gallop and no friction rub.   No murmur heard. Pulmonary/Chest: Effort normal and breath sounds normal. No respiratory distress. He has no wheezes. He has no rales. He exhibits no tenderness.  Abdominal: Soft. Bowel sounds are normal. He exhibits no distension and no mass. There is no tenderness. There is no rebound and no guarding.  Musculoskeletal: Normal range of motion. He exhibits no edema or tenderness.  Lymphadenopathy:    He has no cervical adenopathy.  Neurological: He is alert and oriented to person, place, and time. He has normal reflexes. No cranial nerve deficit. He exhibits normal muscle tone. He displays a negative Romberg sign. Coordination and gait normal.  Skin: Skin is warm and dry. No rash noted.  Psychiatric: He has a normal mood and affect. His behavior is normal. Judgment and thought content normal.    Lab Results  Component Value Date   WBC 5.6 06/15/2015   HGB 15.0 06/15/2015   HCT 45.5 06/15/2015   PLT 112.0 Repeated and verified X2.* 06/15/2015   GLUCOSE 74 06/15/2015   CHOL 131 02/12/2015   TRIG 98.0 02/12/2015   HDL 37.50* 02/12/2015   LDLCALC 74 02/12/2015   ALT 14 02/12/2015   AST 19 02/12/2015   NA 144 06/15/2015   K 4.7 06/15/2015   CL 105 06/15/2015   CREATININE 1.90* 06/15/2015   BUN 17 06/15/2015   CO2 33* 06/15/2015   TSH 6.02* 02/12/2015   PSA 2.51 02/12/2015   INR 1.20 11/26/2014   HGBA1C 5.8 02/12/2015    No results found.  Assessment & Plan:   Diagnoses and all orders for this visit:  Essential hypertension  Atherosclerosis of native coronary artery of native heart without angina pectoris  Other orders -     nitroGLYCERIN (NITROSTAT) 0.4 MG SL tablet; Place 1 tablet (0.4 mg total) under the tongue every 5 (five) minutes as needed.  I have discontinued Mr. Lepkowski ciprofloxacin and predniSONE. I am also having him maintain his fish oil-omega-3 fatty acids, sildenafil, clonazePAM, albuterol,  budesonide-formoterol, nitroGLYCERIN, carvedilol, olmesartan, aspirin, calcitRIOL, clopidogrel, levothyroxine, simvastatin, testosterone cypionate, B-D 3CC LUER-LOK SYR 22GX1", and Cholecalciferol.  Meds ordered this encounter  Medications  . Cholecalciferol 2000 UNITS TABS    Sig: Take 1 tablet by mouth daily.     Follow-up: No Follow-up on file.  Walker Kehr, MD

## 2015-09-15 NOTE — Assessment & Plan Note (Addendum)
BP Readings from Last 3 Encounters:  09/15/15 148/76  06/15/15 139/72  02/11/15 122/82   Olmestartan, Coreg

## 2015-09-15 NOTE — Assessment & Plan Note (Signed)
Hold Simvastatin

## 2015-09-15 NOTE — Assessment & Plan Note (Signed)
ASA, Plavix, Simvastatin

## 2015-09-15 NOTE — Patient Instructions (Signed)
Hold Simvastatin

## 2015-09-15 NOTE — Assessment & Plan Note (Signed)
2016 chronic Try Gabapentin at HS

## 2015-10-19 ENCOUNTER — Other Ambulatory Visit: Payer: Self-pay | Admitting: Internal Medicine

## 2015-11-11 ENCOUNTER — Telehealth: Payer: Self-pay

## 2015-11-11 DIAGNOSIS — R311 Benign essential microscopic hematuria: Secondary | ICD-10-CM | POA: Diagnosis not present

## 2015-11-11 DIAGNOSIS — N201 Calculus of ureter: Secondary | ICD-10-CM | POA: Diagnosis not present

## 2015-11-11 DIAGNOSIS — Z0000 Encounter for general adult medical examination without abnormal findings: Secondary | ICD-10-CM | POA: Diagnosis not present

## 2015-11-11 NOTE — Telephone Encounter (Signed)
PA initiated via covermymeds. Key for PA is [id number]

## 2015-12-16 ENCOUNTER — Ambulatory Visit (INDEPENDENT_AMBULATORY_CARE_PROVIDER_SITE_OTHER): Payer: PPO | Admitting: Internal Medicine

## 2015-12-16 ENCOUNTER — Other Ambulatory Visit (INDEPENDENT_AMBULATORY_CARE_PROVIDER_SITE_OTHER): Payer: PPO

## 2015-12-16 ENCOUNTER — Encounter: Payer: Self-pay | Admitting: Internal Medicine

## 2015-12-16 VITALS — BP 160/90 | HR 75 | Wt 222.0 lb

## 2015-12-16 DIAGNOSIS — I10 Essential (primary) hypertension: Secondary | ICD-10-CM | POA: Diagnosis not present

## 2015-12-16 DIAGNOSIS — I2510 Atherosclerotic heart disease of native coronary artery without angina pectoris: Secondary | ICD-10-CM

## 2015-12-16 DIAGNOSIS — J4540 Moderate persistent asthma, uncomplicated: Secondary | ICD-10-CM

## 2015-12-16 DIAGNOSIS — M545 Low back pain, unspecified: Secondary | ICD-10-CM

## 2015-12-16 DIAGNOSIS — F411 Generalized anxiety disorder: Secondary | ICD-10-CM

## 2015-12-16 LAB — BASIC METABOLIC PANEL
BUN: 16 mg/dL (ref 6–23)
CALCIUM: 9.4 mg/dL (ref 8.4–10.5)
CO2: 33 meq/L — AB (ref 19–32)
CREATININE: 1.94 mg/dL — AB (ref 0.40–1.50)
Chloride: 104 mEq/L (ref 96–112)
GFR: 36.11 mL/min — ABNORMAL LOW (ref >60.00)
Glucose, Bld: 114 mg/dL — ABNORMAL HIGH (ref 70–99)
Potassium: 4.7 mEq/L (ref 3.5–5.1)
Sodium: 141 mEq/L (ref 135–145)

## 2015-12-16 MED ORDER — AMLODIPINE BESYLATE 5 MG PO TABS: 5.0000 mg | ORAL_TABLET | Freq: Every day | ORAL | Status: DC

## 2015-12-16 MED ORDER — TESTOSTERONE CYPIONATE 200 MG/ML IM SOLN: INTRAMUSCULAR | Status: DC

## 2015-12-16 MED ORDER — ALBUTEROL SULFATE HFA 108 (90 BASE) MCG/ACT IN AERS: 2.0000 | INHALATION_SPRAY | Freq: Four times a day (QID) | RESPIRATORY_TRACT | Status: DC | PRN

## 2015-12-16 NOTE — Assessment & Plan Note (Addendum)
Olmestartan, Coreg BP is up Added Amlodipin

## 2015-12-16 NOTE — Progress Notes (Signed)
Pre visit review using our clinic review tool, if applicable. No additional management support is needed unless otherwise documented below in the visit note. 

## 2015-12-16 NOTE — Progress Notes (Signed)
Subjective:  Patient ID: Scott Oneal, male    DOB: 09/19/1941  Age: 75 y.o. MRN: [id number]  CC: No chief complaint on file.   HPI TORON BOWRING presents for CAD, dyslipidemia, OA, CRI f/u. Pt had a kidney stone. C/o ?thrush. SBP is >140 at the [facility]  Outpatient Prescriptions Prior to Visit  Medication Sig Dispense Refill  . aspirin 81 MG tablet Take 81 mg by mouth daily.    . B-D 3CC LUER-LOK SYR 22GX1" 22G X 1" 3 ML MISC Inject 1 each into the muscle every 14 (fourteen) days. 10 each 3  . budesonide-formoterol (SYMBICORT) 160-4.5 MCG/ACT inhaler Inhale 2 puffs into the lungs 2 (two) times daily. 2 Inhaler 6  . calcitRIOL (ROCALTROL) 0.25 MCG capsule Take 1 capsule (0.25 mcg total) by mouth daily. 90 capsule 3  . carvedilol (COREG) 25 MG tablet TAKE 1 TABLET BY MOUTH TWICE A DAY WITH MEALS 180 tablet 2  . Cholecalciferol 2000 UNITS TABS Take 1 tablet by mouth daily.    . clonazePAM (KLONOPIN) 0.25 MG disintegrating tablet Take 1 tablet (0.25 mg total) by mouth 2 (two) times daily as needed (anxiety). 60 tablet 1  . clopidogrel (PLAVIX) 75 MG tablet Take 1 tablet (75 mg total) by mouth daily. 90 tablet 3  . fish oil-omega-3 fatty acids 1000 MG capsule Take 1 g by mouth daily.      Marland Kitchen levothyroxine (SYNTHROID, LEVOTHROID) 150 MCG tablet Take 1 tablet (150 mcg total) by mouth daily. 90 tablet 3  . nitroGLYCERIN (NITROSTAT) 0.4 MG SL tablet Place 1 tablet (0.4 mg total) under the tongue every 5 (five) minutes as needed. 20 tablet 3  . olmesartan (BENICAR) 20 MG tablet Take 1 tablet (20 mg total) by mouth daily. 90 tablet 3  . sildenafil (VIAGRA) 100 MG tablet Take 100 mg by mouth daily as needed.      . simvastatin (ZOCOR) 10 MG tablet Take 1 tablet (10 mg total) by mouth at bedtime. 90 tablet 1  . albuterol (PROAIR HFA) 108 (90 BASE) MCG/ACT inhaler Inhale 2 puffs into the lungs every 6 (six) hours as needed for wheezing or shortness of breath. 1 Inhaler 3  . testosterone cypionate  (DEPOTESTOSTERONE CYPIONATE) 200 MG/ML injection INJECT 2 ML INTO THE MUSCLE EVERY 2 WEEKS. 10 mL 5  . gabapentin (NEURONTIN) 100 MG capsule 1-2 at hs for tinnitus abd insomnia (Patient not taking: Reported on 12/16/2015) 90 capsule 3   No facility-administered medications prior to visit.    ROS Review of Systems  Constitutional: Negative for appetite change, fatigue and unexpected weight change.  HENT: Negative for congestion, nosebleeds, sneezing, sore throat and trouble swallowing.   Eyes: Negative for itching and visual disturbance.  Respiratory: Negative for cough.   Cardiovascular: Negative for chest pain, palpitations and leg swelling.  Gastrointestinal: Negative for nausea, diarrhea, blood in stool and abdominal distention.  Genitourinary: Positive for hematuria. Negative for frequency.  Musculoskeletal: Positive for back pain and arthralgias. Negative for joint swelling, gait problem and neck pain.  Skin: Negative for rash.  Neurological: Negative for dizziness, tremors, speech difficulty and weakness.  Psychiatric/Behavioral: Negative for sleep disturbance, dysphoric mood and agitation. The patient is not nervous/anxious.     Objective:  BP 160/90 mmHg  Pulse 75  Wt 222 lb (100.699 kg)  SpO2 97%  BP Readings from Last 3 Encounters:  12/16/15 160/90  09/15/15 148/76  06/15/15 139/72    Wt Readings from Last 3 Encounters:  12/16/15 222  lb (100.699 kg)  09/15/15 222 lb (100.699 kg)  06/15/15 216 lb (97.977 kg)    Physical Exam  Constitutional: He is oriented to person, place, and time. He appears well-developed. No distress.  NAD  HENT:  Mouth/Throat: Oropharynx is clear and moist.  Eyes: Conjunctivae are normal. Pupils are equal, round, and reactive to light.  Neck: Normal range of motion. No JVD present. No thyromegaly present.  Cardiovascular: Normal rate, regular rhythm, normal heart sounds and intact distal pulses.  Exam reveals no gallop and no friction rub.    No murmur heard. Pulmonary/Chest: Effort normal and breath sounds normal. No respiratory distress. He has no wheezes. He has no rales. He exhibits no tenderness.  Abdominal: Soft. Bowel sounds are normal. He exhibits no distension and no mass. There is no tenderness. There is no rebound and no guarding.  Musculoskeletal: Normal range of motion. He exhibits no edema or tenderness.  Lymphadenopathy:    He has no cervical adenopathy.  Neurological: He is alert and oriented to person, place, and time. He has normal reflexes. No cranial nerve deficit. He exhibits normal muscle tone. He displays a negative Romberg sign. Coordination and gait normal.  Skin: Skin is warm and dry. No rash noted.  Psychiatric: He has a normal mood and affect. His behavior is normal. Judgment and thought content normal.  no thrush  Lab Results  Component Value Date   WBC 5.6 06/15/2015   HGB 15.0 06/15/2015   HCT 45.5 06/15/2015   PLT 112.0 Repeated and verified X2.* 06/15/2015   GLUCOSE 74 06/15/2015   CHOL 131 02/12/2015   TRIG 98.0 02/12/2015   HDL 37.50* 02/12/2015   LDLCALC 74 02/12/2015   ALT 14 02/12/2015   AST 19 02/12/2015   NA 144 06/15/2015   K 4.7 06/15/2015   CL 105 06/15/2015   CREATININE 1.90* 06/15/2015   BUN 17 06/15/2015   CO2 33* 06/15/2015   TSH 6.02* 02/12/2015   PSA 2.51 02/12/2015   INR 1.20 11/26/2014   HGBA1C 5.8 02/12/2015    No results found.  Assessment & Plan:   Diagnoses and all orders for this visit:  Atherosclerosis of native coronary artery of native heart without angina pectoris  Essential hypertension  Moderate persistent asthma without complication  Midline low back pain without sciatica  Generalized anxiety disorder  Other orders -     albuterol (PROAIR HFA) 108 (90 Base) MCG/ACT inhaler; Inhale 2 puffs into the lungs every 6 (six) hours as needed for wheezing or shortness of breath. -     testosterone cypionate (DEPOTESTOSTERONE CYPIONATE) 200 MG/ML  injection; INJECT 2 ML INTO THE MUSCLE EVERY 2 WEEKS. -     amLODipine (NORVASC) 5 MG tablet; Take 1 tablet (5 mg total) by mouth daily.  I am having Mr. Cropper start on amLODipine. I am also having him maintain his fish oil-omega-3 fatty acids, sildenafil, clonazePAM, budesonide-formoterol, olmesartan, aspirin, calcitRIOL, clopidogrel, levothyroxine, simvastatin, B-D 3CC LUER-LOK SYR 22GX1", Cholecalciferol, nitroGLYCERIN, gabapentin, carvedilol, albuterol, and testosterone cypionate.  Meds ordered this encounter  Medications  . albuterol (PROAIR HFA) 108 (90 Base) MCG/ACT inhaler    Sig: Inhale 2 puffs into the lungs every 6 (six) hours as needed for wheezing or shortness of breath.    Dispense:  3 Inhaler    Refill:  3  . testosterone cypionate (DEPOTESTOSTERONE CYPIONATE) 200 MG/ML injection    Sig: INJECT 2 ML INTO THE MUSCLE EVERY 2 WEEKS.    Dispense:  12 mL  Refill:  5  . amLODipine (NORVASC) 5 MG tablet    Sig: Take 1 tablet (5 mg total) by mouth daily.    Dispense:  90 tablet    Refill:  3     Follow-up: Return in about 3 months (around 03/14/2016) for a follow-up visit.  Walker Kehr, MD

## 2015-12-16 NOTE — Assessment & Plan Note (Signed)
ASA, Plavix, Simvastatin

## 2015-12-16 NOTE — Assessment & Plan Note (Signed)
Doing fair

## 2015-12-16 NOTE — Assessment & Plan Note (Signed)
Klonopine  Potential benefits of a long term benzodiazepines  use as well as potential risks  and complications were explained to the patient and were aknowledge

## 2015-12-16 NOTE — Assessment & Plan Note (Signed)
Proair, Symbicort

## 2016-01-19 DIAGNOSIS — D485 Neoplasm of uncertain behavior of skin: Secondary | ICD-10-CM | POA: Diagnosis not present

## 2016-01-19 DIAGNOSIS — L820 Inflamed seborrheic keratosis: Secondary | ICD-10-CM | POA: Diagnosis not present

## 2016-02-29 ENCOUNTER — Ambulatory Visit (HOSPITAL_COMMUNITY)
Admission: RE | Admit: 2016-02-29 | Discharge: 2016-02-29 | Disposition: A | Discharge status: Home / Self Care | Payer: PPO | Source: Ambulatory Visit | Attending: Family Medicine | Admitting: Family Medicine

## 2016-02-29 ENCOUNTER — Ambulatory Visit (INDEPENDENT_AMBULATORY_CARE_PROVIDER_SITE_OTHER): Payer: PPO | Admitting: Family Medicine

## 2016-02-29 ENCOUNTER — Other Ambulatory Visit: Payer: Self-pay | Admitting: Family Medicine

## 2016-02-29 ENCOUNTER — Encounter: Payer: Self-pay | Admitting: Family Medicine

## 2016-02-29 ENCOUNTER — Telehealth: Payer: Self-pay | Admitting: Internal Medicine

## 2016-02-29 VITALS — BP 110/70 | HR 63 | Temp 98.4°F | Resp 17 | Ht 69.5 in | Wt 220.0 lb

## 2016-02-29 DIAGNOSIS — K900 Celiac disease: Secondary | ICD-10-CM | POA: Insufficient documentation

## 2016-02-29 DIAGNOSIS — Z7901 Long term (current) use of anticoagulants: Secondary | ICD-10-CM | POA: Diagnosis not present

## 2016-02-29 DIAGNOSIS — R600 Localized edema: Secondary | ICD-10-CM

## 2016-02-29 DIAGNOSIS — S8011XA Contusion of right lower leg, initial encounter: Secondary | ICD-10-CM | POA: Diagnosis not present

## 2016-02-29 DIAGNOSIS — M79604 Pain in right leg: Secondary | ICD-10-CM

## 2016-02-29 DIAGNOSIS — I10 Essential (primary) hypertension: Secondary | ICD-10-CM | POA: Insufficient documentation

## 2016-02-29 DIAGNOSIS — M79651 Pain in right thigh: Secondary | ICD-10-CM | POA: Insufficient documentation

## 2016-02-29 DIAGNOSIS — I2510 Atherosclerotic heart disease of native coronary artery without angina pectoris: Secondary | ICD-10-CM | POA: Insufficient documentation

## 2016-02-29 DIAGNOSIS — I252 Old myocardial infarction: Secondary | ICD-10-CM | POA: Insufficient documentation

## 2016-02-29 DIAGNOSIS — E7800 Pure hypercholesterolemia, unspecified: Secondary | ICD-10-CM | POA: Insufficient documentation

## 2016-02-29 DIAGNOSIS — Z86010 Personal history of colonic polyps: Secondary | ICD-10-CM | POA: Diagnosis not present

## 2016-02-29 DIAGNOSIS — H409 Unspecified glaucoma: Secondary | ICD-10-CM | POA: Insufficient documentation

## 2016-02-29 DIAGNOSIS — F419 Anxiety disorder, unspecified: Secondary | ICD-10-CM | POA: Insufficient documentation

## 2016-02-29 DIAGNOSIS — M109 Gout, unspecified: Secondary | ICD-10-CM | POA: Diagnosis not present

## 2016-02-29 DIAGNOSIS — J45909 Unspecified asthma, uncomplicated: Secondary | ICD-10-CM | POA: Diagnosis not present

## 2016-02-29 DIAGNOSIS — M25471 Effusion, right ankle: Secondary | ICD-10-CM

## 2016-02-29 DIAGNOSIS — E039 Hypothyroidism, unspecified: Secondary | ICD-10-CM | POA: Insufficient documentation

## 2016-02-29 DIAGNOSIS — K219 Gastro-esophageal reflux disease without esophagitis: Secondary | ICD-10-CM | POA: Insufficient documentation

## 2016-02-29 NOTE — Telephone Encounter (Signed)
[facility] [facility] - Client [place] [facility]     Patient Name: Scott Oneal Initial Comment Caller states, after a long trip. he felt like he has a bad cramp in the back of his Rt leg, now he has blue spot and pain  DOB: [birth year]      Nurse Assessment  Nurse: Luther Parody, RN, Malachy Mood Date/Time (Eastern Time): 02/29/2016 9:27:10 AM  Confirm and document reason for call. If symptomatic, describe symptoms. You must click the next button to save text entered. ---Caller states that he went on a trip on 4/21 and was in the car for 5 hrs. States that after he got out of the car he began having pain in the back of his right thigh. He returned home from the trip yesterday and the leg is still painful, red in color and his right foot is swollen.  Has the patient traveled out of the country within the last 30 days? ---Not Applicable  Does the patient have any new or worsening symptoms? ---Yes  Will a triage be completed? ---Yes  Related visit to physician within the last 2 weeks? ---No  Does the PT have any chronic conditions? (i.e. diabetes, asthma, etc.) ---Yes  List chronic conditions. ---high cholesterol, cardiac, stents, htn  Is this a behavioral health or substance abuse call? ---No    Guidelines     Guideline Title Affirmed Question Affirmed Notes   Leg Pain [1] Thigh or calf pain AND [2] only 1 side AND [3] present > 1 hour    Final Disposition User   See Physician within 4 Hours (or PCP triage) Luther Parody, RN, [place]     Referrals   [facility]   Disagree/Comply: Comply

## 2016-02-29 NOTE — Telephone Encounter (Signed)
Please advise 

## 2016-02-29 NOTE — Telephone Encounter (Signed)
OV ASAP w/any provider Thx

## 2016-02-29 NOTE — Progress Notes (Signed)
*  PRELIMINARY RESULTS* Vascular Ultrasound Right lower extremity venous duplex has been completed.  Preliminary findings: No evidence of DVT or baker's cyst.   Called results to [facility].   Landry Mellow, RDMS, RVT  02/29/2016, 4:04 PM

## 2016-02-29 NOTE — Patient Instructions (Addendum)
You have an appointment at [facility] for your doppler of you right leg at 4pm today.  You are to go the [employer] and register at [employer].  Address: [address], [place], [place] [address].  Phone is [phone]    IF you received an x-ray today, you will receive an invoice from [facility]. Please contact [facility] at [phone] with questions or concerns regarding your invoice.   IF you received labwork today, you will receive an invoice from [employer]. Please contact [facility] at [phone] with questions or concerns regarding your invoice.   Our billing staff will not be able to assist you with questions regarding bills from these companies.  You will be contacted with the lab results as soon as they are available. The fastest way to get your results is to activate your My Chart account. Instructions are located on the last page of this paperwork. If you have not heard from [place] regarding the results in 2 weeks, please contact this office.

## 2016-02-29 NOTE — Progress Notes (Addendum)
Subjective:    Patient ID: Scott Oneal, male    DOB: Aug 08, 1941, 75 y.o.   MRN: [id number]  HPI This is a pleasant 75 yo male who is usually seen by Dr. Alain Marion. He presents today with right leg pain. He took a long car trip to [place] on 02/19/16. He got out of the car and had a bucking sensation and then had pain behind his right knee and into his upper posterior leg which has not resolved. His wife noticed bruising behind his right knee. He has noticed swelling of his right foot since his trip. The swelling gets worse as the day goes on and goes down over night. Has a history of asthma, takes Symbicort "some of the time." No change in breathing.   Has a little pain in left elbow, not sure if it is related to driving or working out at the [facility] prior to his trip. No numbness or tingling, no decreased ROM.    Past Medical History  Diagnosis Date  . Coronary atherosclerosis of unspecified type of vessel, native or graft   . Old myocardial infarction   . Unspecified essential hypertension   . Pure hypercholesterolemia   . Long term (current) use of anticoagulants   . Other specified cardiac dysrhythmias(427.89)   . Esophageal reflux   . Other malaise and fatigue   . Other testicular hypofunction   . Personal history of colonic polyps   . Osteoporosis, unspecified   . Calculus of gallbladder without mention of cholecystitis   . Cholelithiasis   . Celiac disease   . Loss of weight   . Unspecified asthma(493.90)   . Diarrhea   . Unspecified hypothyroidism   . Gout, unspecified   . Benign neoplasm of colon   . Renal insufficiency     chronic  . Hyperparathyroidism   . Anxiety   . Arthritis   . Cataract     bilateral cateracts removed  . Glaucoma    Past Surgical History  Procedure Laterality Date  . Knee surgery      right  . Coronary stent placement  2011    Cypher; in distal circumflex artery  . Cystoscopy  1998  . Shoulder surgery Left 9/14    Dr Shara Blazing  .  Colonoscopy    . Upper gastrointestinal endoscopy    . Coronary angioplasty    . Vascular surgery    . Colon surgery    . Eye surgery    . Colonoscopy with propofol N/A 11/27/2014    Procedure: COLONOSCOPY WITH PROPOFOL;  Surgeon: Milus Banister, MD;  Location: [place];  Service: Endoscopy;  Laterality: N/A;   Family History  Problem Relation Age of Onset  . Hypertension    . Asthma Neg Hx   . Colon cancer Neg Hx   . Esophageal cancer Neg Hx   . Rectal cancer Neg Hx   . Stomach cancer Neg Hx   . Lymphoma Father   . Cancer Father     lymphoma  . Vision loss Maternal Grandmother    Social History  Substance Use Topics  . Smoking status: Never Smoker   . Smokeless tobacco: Never Used  . Alcohol Use: No   Review of Systems No chest pain, no SOB, +pain, +edema    Objective:   Physical Exam  Constitutional: He appears well-developed and well-nourished.  HENT:  Head: Normocephalic and atraumatic.  Eyes: Conjunctivae are normal.  Cardiovascular: Normal rate.   Pulmonary/Chest: Effort normal.  Musculoskeletal:       Left elbow: He exhibits normal range of motion, no swelling and no effusion.       Legs: Vitals reviewed.     BP 110/70 mmHg  Pulse 63  Temp(Src) 98.4 F (36.9 C) (Oral)  Resp 17  Ht 5' 9.5" (1.765 m)  Wt 220 lb (99.791 kg)  BMI 32.03 kg/m2  SpO2 97% Wt Readings from Last 3 Encounters:  02/29/16 220 lb (99.791 kg)  12/16/15 222 lb (100.699 kg)  09/15/15 222 lb (100.699 kg)      Assessment & Plan:  1. Right leg pain - VAS [place] LOWER EXTREMITY VENOUS (DVT); Future- this will be performed today at 4 pm and we will notify patient of the results  2. Superficial bruising of lower leg, right, initial encounter - VAS [place] LOWER EXTREMITY VENOUS (DVT); Future  3. Pedal edema - VAS [place] LOWER EXTREMITY VENOUS (DVT); Future  4. Left elbow pain - good ROM, no edema, suspect muscle strain. Can try Tylenol.  Clarene Reamer, FNP-BC  [facility]  02/29/2016 11:48 AM Received call this afternoon that [place] was negative for DVT. He was notified of results prior to leaving radiology and I called him and left him a message on his voice mail to let me know if he has further questions.

## 2016-02-29 NOTE — Telephone Encounter (Signed)
Per chart pt went to urgent care saw Dr. Carlean Purl...Scott Oneal

## 2016-03-15 ENCOUNTER — Ambulatory Visit (INDEPENDENT_AMBULATORY_CARE_PROVIDER_SITE_OTHER): Payer: PPO | Admitting: Internal Medicine

## 2016-03-15 ENCOUNTER — Ambulatory Visit (INDEPENDENT_AMBULATORY_CARE_PROVIDER_SITE_OTHER)
Admission: RE | Admit: 2016-03-15 | Discharge: 2016-03-15 | Disposition: A | Discharge status: Home / Self Care | Payer: PPO | Source: Ambulatory Visit | Attending: Internal Medicine | Admitting: Internal Medicine

## 2016-03-15 ENCOUNTER — Other Ambulatory Visit (INDEPENDENT_AMBULATORY_CARE_PROVIDER_SITE_OTHER): Payer: PPO

## 2016-03-15 ENCOUNTER — Encounter: Payer: Self-pay | Admitting: Internal Medicine

## 2016-03-15 VITALS — BP 110/60 | HR 70 | Wt 222.0 lb

## 2016-03-15 DIAGNOSIS — K2100 Gastro-esophageal reflux disease with esophagitis, without bleeding: Secondary | ICD-10-CM

## 2016-03-15 DIAGNOSIS — M79651 Pain in right thigh: Secondary | ICD-10-CM

## 2016-03-15 DIAGNOSIS — E038 Other specified hypothyroidism: Secondary | ICD-10-CM

## 2016-03-15 DIAGNOSIS — K900 Celiac disease: Secondary | ICD-10-CM

## 2016-03-15 DIAGNOSIS — I2510 Atherosclerotic heart disease of native coronary artery without angina pectoris: Secondary | ICD-10-CM | POA: Diagnosis not present

## 2016-03-15 DIAGNOSIS — M79659 Pain in unspecified thigh: Secondary | ICD-10-CM | POA: Insufficient documentation

## 2016-03-15 DIAGNOSIS — E034 Atrophy of thyroid (acquired): Secondary | ICD-10-CM

## 2016-03-15 DIAGNOSIS — R7309 Other abnormal glucose: Secondary | ICD-10-CM

## 2016-03-15 DIAGNOSIS — E291 Testicular hypofunction: Secondary | ICD-10-CM

## 2016-03-15 LAB — HEPATIC FUNCTION PANEL
ALBUMIN: 4 g/dL (ref 3.5–5.2)
ALT: 17 U/L (ref 0–53)
AST: 21 U/L (ref 0–37)
Alkaline Phosphatase: 47 U/L (ref 39–117)
Bilirubin, Direct: 0.1 mg/dL (ref 0.0–0.3)
Total Bilirubin: 0.8 mg/dL (ref 0.2–1.2)
Total Protein: 6.1 g/dL (ref 6.0–8.3)

## 2016-03-15 LAB — BASIC METABOLIC PANEL
BUN: 18 mg/dL (ref 6–23)
CO2: 29 mEq/L (ref 19–32)
CREATININE: 1.94 mg/dL — AB (ref 0.40–1.50)
Calcium: 9.2 mg/dL (ref 8.4–10.5)
Chloride: 105 mEq/L (ref 96–112)
GFR: 36.09 mL/min — AB (ref >60.00)
GLUCOSE: 159 mg/dL — AB (ref 70–99)
Potassium: 4.6 mEq/L (ref 3.5–5.1)
Sodium: 141 mEq/L (ref 135–145)

## 2016-03-15 LAB — CK: Total CK: 177 U/L (ref 7–232)

## 2016-03-15 LAB — TSH: TSH: 3.81 u[IU]/mL (ref 0.35–4.50)

## 2016-03-15 LAB — HEMOGLOBIN A1C: Hgb A1c MFr Bld: 5.9 % (ref 4.6–6.5)

## 2016-03-15 NOTE — Assessment & Plan Note (Signed)
MSK X ray femur

## 2016-03-15 NOTE — Assessment & Plan Note (Signed)
ASA, Plavix, Simvastatin

## 2016-03-15 NOTE — Assessment & Plan Note (Signed)
On gluten free diet

## 2016-03-15 NOTE — Assessment & Plan Note (Signed)
On Levothroid

## 2016-03-15 NOTE — Addendum Note (Signed)
Addended by: Cassandria Anger on: 03/15/2016 07:52 PM   Modules accepted: Orders

## 2016-03-15 NOTE — Progress Notes (Signed)
Subjective:  Patient ID: Scott Oneal, male    DOB: January 08, 1941  Age: 75 y.o. MRN: [id number]  CC: No chief complaint on file.   HPI NEVAN CREIGHTON presents for HTN, CAD, dyslipidemia f/u. C/o severe R femur pain x months. C/o a lot of joint pain R>L  Outpatient Prescriptions Prior to Visit  Medication Sig Dispense Refill  . albuterol (PROAIR HFA) 108 (90 Base) MCG/ACT inhaler Inhale 2 puffs into the lungs every 6 (six) hours as needed for wheezing or shortness of breath. 3 Inhaler 3  . amLODipine (NORVASC) 5 MG tablet Take 1 tablet (5 mg total) by mouth daily. 90 tablet 3  . aspirin 81 MG tablet Take 81 mg by mouth daily.    . B-D 3CC LUER-LOK SYR 22GX1" 22G X 1" 3 ML MISC Inject 1 each into the muscle every 14 (fourteen) days. 10 each 3  . budesonide-formoterol (SYMBICORT) 160-4.5 MCG/ACT inhaler Inhale 2 puffs into the lungs 2 (two) times daily. 2 Inhaler 6  . calcitRIOL (ROCALTROL) 0.25 MCG capsule Take 1 capsule (0.25 mcg total) by mouth daily. 90 capsule 3  . carvedilol (COREG) 25 MG tablet TAKE 1 TABLET BY MOUTH TWICE A DAY WITH MEALS 180 tablet 2  . Cholecalciferol 2000 UNITS TABS Take 1 tablet by mouth daily.    . clonazePAM (KLONOPIN) 0.25 MG disintegrating tablet Take 1 tablet (0.25 mg total) by mouth 2 (two) times daily as needed (anxiety). 60 tablet 1  . clopidogrel (PLAVIX) 75 MG tablet Take 1 tablet (75 mg total) by mouth daily. 90 tablet 3  . fish oil-omega-3 fatty acids 1000 MG capsule Take 1 g by mouth daily.      Marland Kitchen gabapentin (NEURONTIN) 100 MG capsule 1-2 at hs for tinnitus abd insomnia 90 capsule 3  . levothyroxine (SYNTHROID, LEVOTHROID) 150 MCG tablet Take 1 tablet (150 mcg total) by mouth daily. 90 tablet 3  . nitroGLYCERIN (NITROSTAT) 0.4 MG SL tablet Place 1 tablet (0.4 mg total) under the tongue every 5 (five) minutes as needed. 20 tablet 3  . olmesartan (BENICAR) 20 MG tablet Take 1 tablet (20 mg total) by mouth daily. 90 tablet 3  . sildenafil (VIAGRA) 100 MG  tablet Take 100 mg by mouth daily as needed.      . simvastatin (ZOCOR) 10 MG tablet Take 1 tablet (10 mg total) by mouth at bedtime. 90 tablet 1  . testosterone cypionate (DEPOTESTOSTERONE CYPIONATE) 200 MG/ML injection INJECT 2 ML INTO THE MUSCLE EVERY 2 WEEKS. 12 mL 5   No facility-administered medications prior to visit.    ROS Review of Systems  Constitutional: Negative for appetite change, fatigue and unexpected weight change.  HENT: Negative for congestion, nosebleeds, sneezing, sore throat and trouble swallowing.   Eyes: Negative for itching and visual disturbance.  Respiratory: Negative for cough.   Cardiovascular: Negative for chest pain, palpitations and leg swelling.  Gastrointestinal: Negative for nausea, diarrhea, blood in stool and abdominal distention.  Genitourinary: Negative for frequency and hematuria.  Musculoskeletal: Negative for back pain, joint swelling, gait problem and neck pain.  Skin: Negative for rash.  Neurological: Negative for dizziness, tremors, speech difficulty and weakness.  Psychiatric/Behavioral: Negative for suicidal ideas, sleep disturbance, dysphoric mood and agitation. The patient is nervous/anxious.     Objective:  BP 110/60 mmHg  Pulse 70  Wt 222 lb (100.699 kg)  SpO2 97%  BP Readings from Last 3 Encounters:  03/15/16 110/60  02/29/16 110/70  12/16/15 160/90  Wt Readings from Last 3 Encounters:  03/15/16 222 lb (100.699 kg)  02/29/16 220 lb (99.791 kg)  12/16/15 222 lb (100.699 kg)    Physical Exam  Constitutional: He is oriented to person, place, and time. He appears well-developed. No distress.  NAD  HENT:  Mouth/Throat: Oropharynx is clear and moist.  Eyes: Conjunctivae are normal. Pupils are equal, round, and reactive to light.  Neck: Normal range of motion. No JVD present. No thyromegaly present.  Cardiovascular: Normal rate, regular rhythm, normal heart sounds and intact distal pulses.  Exam reveals no gallop and no  friction rub.   No murmur heard. Pulmonary/Chest: Effort normal and breath sounds normal. No respiratory distress. He has no wheezes. He has no rales. He exhibits no tenderness.  Abdominal: Soft. Bowel sounds are normal. He exhibits no distension and no mass. There is no tenderness. There is no rebound and no guarding.  Musculoskeletal: Normal range of motion. He exhibits tenderness. He exhibits no edema.  Lymphadenopathy:    He has no cervical adenopathy.  Neurological: He is alert and oriented to person, place, and time. He has normal reflexes. No cranial nerve deficit. He exhibits normal muscle tone. He displays a negative Romberg sign. Coordination and gait normal.  Skin: Skin is warm and dry. No rash noted.  Psychiatric: He has a normal mood and affect. His behavior is normal. Judgment and thought content normal.    Lab Results  Component Value Date   WBC 5.6 06/15/2015   HGB 15.0 06/15/2015   HCT 45.5 06/15/2015   PLT 112.0 Repeated and verified X2.* 06/15/2015   GLUCOSE 114* 12/16/2015   CHOL 131 02/12/2015   TRIG 98.0 02/12/2015   HDL 37.50* 02/12/2015   LDLCALC 74 02/12/2015   ALT 14 02/12/2015   AST 19 02/12/2015   NA 141 12/16/2015   K 4.7 12/16/2015   CL 104 12/16/2015   CREATININE 1.94* 12/16/2015   BUN 16 12/16/2015   CO2 33* 12/16/2015   TSH 6.02* 02/12/2015   PSA 2.51 02/12/2015   INR 1.20 11/26/2014   HGBA1C 5.8 02/12/2015    No results found.  Assessment & Plan:   There are no diagnoses linked to this encounter. I am having Mr. Altadonna maintain his fish oil-omega-3 fatty acids, sildenafil, clonazePAM, budesonide-formoterol, olmesartan, aspirin, calcitRIOL, clopidogrel, levothyroxine, simvastatin, B-D 3CC LUER-LOK SYR 22GX1", Cholecalciferol, nitroGLYCERIN, gabapentin, carvedilol, albuterol, testosterone cypionate, and amLODipine.  No orders of the defined types were placed in this encounter.     Follow-up: No Follow-up on file.  Walker Kehr, MD

## 2016-03-15 NOTE — Patient Instructions (Signed)
Agave syrup in place sugar

## 2016-03-15 NOTE — Progress Notes (Signed)
Pre visit review using our clinic review tool, if applicable. No additional management support is needed unless otherwise documented below in the visit note. 

## 2016-03-15 NOTE — Assessment & Plan Note (Signed)
On Testosterone Labs

## 2016-03-18 ENCOUNTER — Other Ambulatory Visit: Payer: Self-pay | Admitting: Internal Medicine

## 2016-04-08 ENCOUNTER — Encounter: Payer: Self-pay | Admitting: Cardiovascular Disease

## 2016-04-08 ENCOUNTER — Ambulatory Visit (INDEPENDENT_AMBULATORY_CARE_PROVIDER_SITE_OTHER): Payer: PPO | Admitting: Cardiovascular Disease

## 2016-04-08 VITALS — BP 138/74 | HR 66 | Ht 72.0 in | Wt 219.8 lb

## 2016-04-08 DIAGNOSIS — I2510 Atherosclerotic heart disease of native coronary artery without angina pectoris: Secondary | ICD-10-CM

## 2016-04-08 DIAGNOSIS — I10 Essential (primary) hypertension: Secondary | ICD-10-CM

## 2016-04-08 NOTE — Patient Instructions (Signed)

## 2016-04-08 NOTE — Progress Notes (Signed)
Cardiology Office Note Date:  04/10/2016   ID:  Scott, Oneal 07-12-1941, MRN [id number]  PCP:  Walker Kehr, MD  Cardiologist:  Sherren Mocha, MD    No chief complaint on file.    History of Present Illness: Scott Oneal is a 75 y.o. male who presents for follow-up evaluation. The patient has been followed for coronary artery disease. Other medical problems include chronic kidney disease, hypertension, and hyperlipidemia.  He initially presented in 2005 with an inferior wall myocardial infarction. He was treated with overlapping Cypher stents in the right coronary artery. He underwent staged PCI of the left circumflex during his index hospitalization. He has been maintained on long-term dual antiplatelet therapy with aspirin and Plavix. He was noted to have diffuse nonobstructive disease of the LAD. LV function was preserved at the time of his myocardial infarction with an estimated ejection fraction of 60%.  The patient is doing well. His blood pressure had been elevated and adjustments were made to his antihypertensive regimen. Blood pressure now is running about 130/60's when he checks it at the [facility]. He feels good and has no complaints today. He specifically denies chest pain or shortness of breath with exertion. He exercises on a regular basis.  Past Medical History  Diagnosis Date  . Coronary atherosclerosis of unspecified type of vessel, native or graft   . Old myocardial infarction   . Unspecified essential hypertension   . Pure hypercholesterolemia   . Long term (current) use of anticoagulants   . Other specified cardiac dysrhythmias(427.89)   . Esophageal reflux   . Other malaise and fatigue   . Other testicular hypofunction   . Personal history of colonic polyps   . Osteoporosis, unspecified   . Calculus of gallbladder without mention of cholecystitis   . Cholelithiasis   . Celiac disease   . Loss of weight   . Unspecified asthma(493.90)   . Diarrhea   .  Unspecified hypothyroidism   . Gout, unspecified   . Benign neoplasm of colon   . Renal insufficiency     chronic  . Hyperparathyroidism   . Anxiety   . Arthritis   . Cataract     bilateral cateracts removed  . Glaucoma     Past Surgical History  Procedure Laterality Date  . Knee surgery      right  . Coronary stent placement  2011    Cypher; in distal circumflex artery  . Cystoscopy  1998  . Shoulder surgery Left 9/14    Dr Shara Blazing  . Colonoscopy    . Upper gastrointestinal endoscopy    . Coronary angioplasty    . Vascular surgery    . Colon surgery    . Eye surgery    . Colonoscopy with propofol N/A 11/27/2014    Procedure: COLONOSCOPY WITH PROPOFOL;  Surgeon: Milus Banister, MD;  Location: [place];  Service: Endoscopy;  Laterality: N/A;    Current Outpatient Prescriptions  Medication Sig Dispense Refill  . albuterol (PROAIR HFA) 108 (90 Base) MCG/ACT inhaler Inhale 2 puffs into the lungs every 6 (six) hours as needed for wheezing or shortness of breath. 3 Inhaler 3  . amLODipine (NORVASC) 5 MG tablet Take 1 tablet (5 mg total) by mouth daily. 90 tablet 3  . aspirin 81 MG tablet Take 81 mg by mouth daily.    . B-D 3CC LUER-LOK SYR 22GX1" 22G X 1" 3 ML MISC Inject 1 each into the muscle every 14 (fourteen) days.  10 each 3  . budesonide-formoterol (SYMBICORT) 160-4.5 MCG/ACT inhaler Inhale 2 puffs into the lungs 2 (two) times daily. 2 Inhaler 6  . calcitRIOL (ROCALTROL) 0.25 MCG capsule Take 1 capsule (0.25 mcg total) by mouth daily. 90 capsule 3  . carvedilol (COREG) 25 MG tablet TAKE 1 TABLET BY MOUTH TWICE A DAY WITH MEALS 180 tablet 2  . Cholecalciferol 2000 UNITS TABS Take 1 tablet by mouth daily.    . clonazePAM (KLONOPIN) 0.25 MG disintegrating tablet Take 1 tablet (0.25 mg total) by mouth 2 (two) times daily as needed (anxiety). 60 tablet 1  . clopidogrel (PLAVIX) 75 MG tablet Take 1 tablet (75 mg total) by mouth daily. 90 tablet 3  . fish oil-omega-3 fatty  acids 1000 MG capsule Take 1 g by mouth daily.      Marland Kitchen levothyroxine (SYNTHROID, LEVOTHROID) 150 MCG tablet Take 1 tablet (150 mcg total) by mouth daily. 90 tablet 3  . nitroGLYCERIN (NITROSTAT) 0.4 MG SL tablet Place 1 tablet (0.4 mg total) under the tongue every 5 (five) minutes as needed. 20 tablet 3  . olmesartan (BENICAR) 20 MG tablet Take 1 tablet (20 mg total) by mouth daily. 90 tablet 3  . sildenafil (VIAGRA) 100 MG tablet Take 100 mg by mouth daily as needed.      . simvastatin (ZOCOR) 10 MG tablet TAKE 1 TABLET AT BEDTIME 90 tablet 3  . testosterone cypionate (DEPOTESTOSTERONE CYPIONATE) 200 MG/ML injection INJECT 2 ML INTO THE MUSCLE EVERY 2 WEEKS. 12 mL 5   No current facility-administered medications for this visit.    Allergies:   Allantoin-pramoxine and Metoprolol tartrate   Social History:  The patient  reports that he has never smoked. He has never used smokeless tobacco. He reports that he does not drink alcohol or use illicit drugs.   Family History:  The patient's family history includes Cancer in his father; Lymphoma in his father; Vision loss in his maternal grandmother. There is no history of Asthma, Colon cancer, Esophageal cancer, Rectal cancer, or Stomach cancer.    ROS:  Please see the history of present illness.   All other systems are reviewed and negative.    PHYSICAL EXAM: VS:  BP 138/74 mmHg  Pulse 66  Ht 6' (1.829 m)  Wt 219 lb 12.8 oz (99.701 kg)  BMI 29.80 kg/m2 , BMI Body mass index is 29.8 kg/(m^2). GEN: Well nourished, well developed, in no acute distress HEENT: normal Neck: no JVD, no masses. No carotid bruits Cardiac: RRR without murmur or gallop                Respiratory:  clear to auscultation bilaterally, normal work of breathing GI: soft, nontender, nondistended, + BS MS: no deformity or atrophy Ext: no pretibial edema, pedal pulses 2+= bilaterally Skin: warm and dry, no rash Neuro:  Strength and sensation are intact Psych: euthymic  mood, full affect  EKG:  EKG is ordered today. The ekg ordered today shows normal sinus rhythm 66 bpm, age indeterminate inferior infarct, otherwise within normal limits.  Recent Labs: 06/15/2015: Hemoglobin 15.0; Platelets 112.0 Repeated and verified X2.* 03/15/2016: ALT 17; BUN 18; Creatinine, Ser 1.94*; Potassium 4.6; Sodium 141; TSH 3.81   Lipid Panel     Component Value Date/Time   CHOL 131 02/12/2015 0735   TRIG 98.0 02/12/2015 0735   HDL 37.50* 02/12/2015 0735   CHOLHDL 3 02/12/2015 0735   VLDL 19.6 02/12/2015 0735   LDLCALC 74 02/12/2015 0735  Wt Readings from Last 3 Encounters:  04/08/16 219 lb 12.8 oz (99.701 kg)  03/15/16 222 lb (100.699 kg)  02/29/16 220 lb (99.791 kg)     ASSESSMENT AND PLAN: 1.  CAD, native vessel, without symptoms of angina: The patient is maintained on long-term dual antiplatelet therapy with overlapping first generation drug-eluting stents in his right coronary artery.  2. Essential hypertension: BP well-controlled on a combination of amlodipine, carvedilol, and Benicar.  3. Hyperlipidemia: Recent LFTs reviewed. Lipids have been at goal on low dose simvastatin.  4. Chronic kidney disease stage III: Labs reviewed. Renal function has been stable now for over 5 years. He is treated with an ARB.  Current medicines are reviewed with the patient today.  The patient does not have concerns regarding medicines.  Labs/ tests ordered today include:   Orders Placed This Encounter  Procedures  . EKG 12-Lead    Disposition:   FU one year  Signed, Sherren Mocha, MD  04/10/2016 9:51 PM    [facility] [place], [place], [place]  [address] Phone: [phone]; Fax: [phone]

## 2016-06-06 ENCOUNTER — Ambulatory Visit (INDEPENDENT_AMBULATORY_CARE_PROVIDER_SITE_OTHER): Payer: PPO | Admitting: Internal Medicine

## 2016-06-06 ENCOUNTER — Encounter: Payer: Self-pay | Admitting: Internal Medicine

## 2016-06-06 DIAGNOSIS — G4700 Insomnia, unspecified: Secondary | ICD-10-CM

## 2016-06-06 DIAGNOSIS — I10 Essential (primary) hypertension: Secondary | ICD-10-CM | POA: Diagnosis not present

## 2016-06-06 DIAGNOSIS — R5383 Other fatigue: Secondary | ICD-10-CM

## 2016-06-06 DIAGNOSIS — R609 Edema, unspecified: Secondary | ICD-10-CM | POA: Insufficient documentation

## 2016-06-06 DIAGNOSIS — R600 Localized edema: Secondary | ICD-10-CM | POA: Diagnosis not present

## 2016-06-06 MED ORDER — NORTRIPTYLINE HCL 10 MG PO CAPS: 10.0000 mg | ORAL_CAPSULE | Freq: Every day | ORAL | 5 refills | Status: DC

## 2016-06-06 NOTE — Assessment & Plan Note (Signed)
Try Amlodipine 1/2 tab a day to see if fatigue is better

## 2016-06-06 NOTE — Patient Instructions (Signed)
Try Amlodipine 1/2 tab a day to see if the swelling is better

## 2016-06-06 NOTE — Assessment & Plan Note (Signed)
Try Amlodipine 1/2 tab a day to see if the swelling is better

## 2016-06-06 NOTE — Assessment & Plan Note (Signed)
Try Pamelor at [facility]

## 2016-06-06 NOTE — Progress Notes (Signed)
Subjective:  Patient ID: Scott Oneal, male    DOB: [birth year]  Age: 75 y.o. MRN: [id number]  CC: No chief complaint on file.   HPI MAKSIM PEREGOY presents for hypothyroidism, feet swelling, anxiety f/u. C/o fatigue. C/o not sleeping too well  Outpatient Medications Prior to Visit  Medication Sig Dispense Refill  . albuterol (PROAIR HFA) 108 (90 Base) MCG/ACT inhaler Inhale 2 puffs into the lungs every 6 (six) hours as needed for wheezing or shortness of breath. 3 Inhaler 3  . amLODipine (NORVASC) 5 MG tablet Take 1 tablet (5 mg total) by mouth daily. 90 tablet 3  . aspirin 81 MG tablet Take 81 mg by mouth daily.    . B-D 3CC LUER-LOK SYR 22GX1" 22G X 1" 3 ML MISC Inject 1 each into the muscle every 14 (fourteen) days. 10 each 3  . budesonide-formoterol (SYMBICORT) 160-4.5 MCG/ACT inhaler Inhale 2 puffs into the lungs 2 (two) times daily. 2 Inhaler 6  . calcitRIOL (ROCALTROL) 0.25 MCG capsule Take 1 capsule (0.25 mcg total) by mouth daily. 90 capsule 3  . carvedilol (COREG) 25 MG tablet TAKE 1 TABLET BY MOUTH TWICE A DAY WITH MEALS 180 tablet 2  . Cholecalciferol 2000 UNITS TABS Take 1 tablet by mouth daily.    . clopidogrel (PLAVIX) 75 MG tablet Take 1 tablet (75 mg total) by mouth daily. 90 tablet 3  . fish oil-omega-3 fatty acids 1000 MG capsule Take 1 g by mouth daily.      Marland Kitchen levothyroxine (SYNTHROID, LEVOTHROID) 150 MCG tablet Take 1 tablet (150 mcg total) by mouth daily. 90 tablet 3  . nitroGLYCERIN (NITROSTAT) 0.4 MG SL tablet Place 1 tablet (0.4 mg total) under the tongue every 5 (five) minutes as needed. 20 tablet 3  . olmesartan (BENICAR) 20 MG tablet Take 1 tablet (20 mg total) by mouth daily. 90 tablet 3  . sildenafil (VIAGRA) 100 MG tablet Take 100 mg by mouth daily as needed.      . simvastatin (ZOCOR) 10 MG tablet TAKE 1 TABLET AT BEDTIME 90 tablet 3  . testosterone cypionate (DEPOTESTOSTERONE CYPIONATE) 200 MG/ML injection INJECT 2 ML INTO THE MUSCLE EVERY 2 WEEKS. 12 mL  5  . clonazePAM (KLONOPIN) 0.25 MG disintegrating tablet Take 1 tablet (0.25 mg total) by mouth 2 (two) times daily as needed (anxiety). (Patient not taking: Reported on 06/06/2016) 60 tablet 1   No facility-administered medications prior to visit.     ROS Review of Systems  Constitutional: Negative for appetite change, fatigue and unexpected weight change.  HENT: Negative for congestion, nosebleeds, sneezing, sore throat and trouble swallowing.   Eyes: Negative for itching and visual disturbance.  Respiratory: Negative for cough.   Cardiovascular: Positive for leg swelling. Negative for chest pain and palpitations.  Gastrointestinal: Negative for abdominal distention, blood in stool, diarrhea and nausea.  Genitourinary: Negative for frequency and hematuria.  Musculoskeletal: Negative for back pain, gait problem, joint swelling and neck pain.  Skin: Negative for rash.  Neurological: Negative for dizziness, tremors, speech difficulty and weakness.  Psychiatric/Behavioral: Negative for agitation, dysphoric mood and sleep disturbance. The patient is not nervous/anxious.     Objective:  BP 128/78   Pulse 80   Wt 221 lb (100.2 kg)   SpO2 96%   BMI 29.97 kg/m   BP Readings from Last 3 Encounters:  06/06/16 128/78  04/08/16 138/74  03/15/16 110/60    Wt Readings from Last 3 Encounters:  06/06/16 221 lb (100.2 kg)  04/08/16 219 lb 12.8 oz (99.7 kg)  03/15/16 222 lb (100.7 kg)    Physical Exam  Constitutional: He is oriented to person, place, and time. He appears well-developed. No distress.  NAD  HENT:  Mouth/Throat: Oropharynx is clear and moist.  Eyes: Conjunctivae are normal. Pupils are equal, round, and reactive to light.  Neck: Normal range of motion. No JVD present. No thyromegaly present.  Cardiovascular: Normal rate, regular rhythm, normal heart sounds and intact distal pulses.  Exam reveals no gallop and no friction rub.   No murmur heard. Pulmonary/Chest: Effort  normal and breath sounds normal. No respiratory distress. He has no wheezes. He has no rales. He exhibits no tenderness.  Abdominal: Soft. Bowel sounds are normal. He exhibits no distension and no mass. There is no tenderness. There is no rebound and no guarding.  Musculoskeletal: Normal range of motion. He exhibits edema. He exhibits no tenderness.  Lymphadenopathy:    He has no cervical adenopathy.  Neurological: He is alert and oriented to person, place, and time. He has normal reflexes. No cranial nerve deficit. He exhibits normal muscle tone. He displays a negative Romberg sign. Coordination and gait normal.  Skin: Skin is warm and dry. No rash noted.  Psychiatric: He has a normal mood and affect. His behavior is normal. Judgment and thought content normal.  edema trace to 1+ B  Lab Results  Component Value Date   WBC 5.6 06/15/2015   HGB 15.0 06/15/2015   HCT 45.5 06/15/2015   PLT 112.0 Repeated and verified X2. (L) 06/15/2015   GLUCOSE 159 (H) 03/15/2016   CHOL 131 02/12/2015   TRIG 98.0 02/12/2015   HDL 37.50 (L) 02/12/2015   LDLCALC 74 02/12/2015   ALT 17 03/15/2016   AST 21 03/15/2016   NA 141 03/15/2016   K 4.6 03/15/2016   CL 105 03/15/2016   CREATININE 1.94 (H) 03/15/2016   BUN 18 03/15/2016   CO2 29 03/15/2016   TSH 3.81 03/15/2016   PSA 2.51 02/12/2015   INR 1.20 11/26/2014   HGBA1C 5.9 03/15/2016    Dg Femur, Min 2 Views Right  Result Date: 03/15/2016 CLINICAL DATA:  Pain.  Initial evaluation. EXAM: RIGHT FEMUR 2 VIEWS COMPARISON:  No prior. FINDINGS: No acute bony or joint abnormality identified.Degenerative changes right hip. Peripheral vascular calcification. IMPRESSION: 1. No acute abnormality identified. Degenerative changes right hip and knee. 2. Peripheral vascular disease. Electronically Signed   By: Marcello Moores  Register   On: 03/15/2016 10:09    Assessment & Plan:   There are no diagnoses linked to this encounter. I am having Mr. Latulippe maintain his fish  oil-omega-3 fatty acids, sildenafil, clonazePAM, budesonide-formoterol, olmesartan, aspirin, calcitRIOL, clopidogrel, levothyroxine, B-D 3CC LUER-LOK SYR 22GX1", Cholecalciferol, nitroGLYCERIN, carvedilol, albuterol, testosterone cypionate, amLODipine, and simvastatin.  No orders of the defined types were placed in this encounter.    Follow-up: No Follow-up on file.  Walker Kehr, MD

## 2016-06-06 NOTE — Progress Notes (Signed)
Pre visit review using our clinic review tool, if applicable. No additional management support is needed unless otherwise documented below in the visit note. 

## 2016-06-13 DIAGNOSIS — H40003 Preglaucoma, unspecified, bilateral: Secondary | ICD-10-CM | POA: Diagnosis not present

## 2016-06-28 ENCOUNTER — Encounter: Payer: Self-pay | Admitting: Internal Medicine

## 2016-06-29 DIAGNOSIS — R319 Hematuria, unspecified: Secondary | ICD-10-CM | POA: Diagnosis not present

## 2016-06-29 DIAGNOSIS — Z6841 Body Mass Index (BMI) 40.0 and over, adult: Secondary | ICD-10-CM | POA: Diagnosis not present

## 2016-06-29 DIAGNOSIS — I10 Essential (primary) hypertension: Secondary | ICD-10-CM | POA: Diagnosis not present

## 2016-06-29 DIAGNOSIS — N183 Chronic kidney disease, stage 3 (moderate): Secondary | ICD-10-CM | POA: Diagnosis not present

## 2016-06-30 ENCOUNTER — Telehealth: Payer: Self-pay | Admitting: *Deleted

## 2016-06-30 ENCOUNTER — Other Ambulatory Visit (INDEPENDENT_AMBULATORY_CARE_PROVIDER_SITE_OTHER): Payer: PPO

## 2016-06-30 DIAGNOSIS — R609 Edema, unspecified: Secondary | ICD-10-CM

## 2016-06-30 LAB — BASIC METABOLIC PANEL
BUN: 14 mg/dL (ref 6–23)
CALCIUM: 8.7 mg/dL (ref 8.4–10.5)
CO2: 32 mEq/L (ref 19–32)
Chloride: 104 mEq/L (ref 96–112)
Creatinine, Ser: 1.93 mg/dL — ABNORMAL HIGH (ref 0.40–1.50)
GFR: 36.28 mL/min — AB (ref >60.00)
Glucose, Bld: 81 mg/dL (ref 70–99)
Potassium: 4.5 mEq/L (ref 3.5–5.1)
SODIUM: 140 meq/L (ref 135–145)

## 2016-06-30 LAB — TSH: TSH: 2.44 u[IU]/mL (ref 0.35–4.50)

## 2016-06-30 LAB — D-DIMER, QUANTITATIVE: D-Dimer, Quant: 0.82 mcg/mL FEU — ABNORMAL HIGH (ref <0.50)

## 2016-06-30 NOTE — Telephone Encounter (Signed)
Pt informed.  Labs ordered.   Scott Oneal,   Please have him come for a D-dimer blood test and TSH, BMET tests on 8/30. Dx edema  Thx    ----- Message -----  From: Cresenciano Lick, CMA  Sent: 06/28/2016  8:31 AM  To: Cassandria Anger, MD  Subject: FW: Visit Follow-Up Question                 ----- Message -----  From: Aviva Signs, CMA  Sent: 06/28/2016  7:54 AM  To: Cresenciano Lick, CMA  Subject: FW: Visit Follow-Up Question                 ----- Message -----  From: Scott Oneal  Sent: 06/28/2016  6:22 AM  To: [facility]  Subject: Visit Follow-Up Question               [name],  It has been a couple of weeks since I was in and you reduced the blood pressure med by 1/2. The med was Norvasc 43m.  I continue to have swelling in the right ankle and foot and still have the constant pain in the right thigh.   Something is not right here and we need to work to solve.  Let me know what are next step is.  Thank you,  [name]

## 2016-07-01 ENCOUNTER — Other Ambulatory Visit: Payer: Self-pay | Admitting: Internal Medicine

## 2016-07-01 DIAGNOSIS — R600 Localized edema: Secondary | ICD-10-CM

## 2016-07-02 DIAGNOSIS — R05 Cough: Secondary | ICD-10-CM | POA: Diagnosis not present

## 2016-07-02 DIAGNOSIS — R509 Fever, unspecified: Secondary | ICD-10-CM | POA: Diagnosis not present

## 2016-07-02 DIAGNOSIS — Z87898 Personal history of other specified conditions: Secondary | ICD-10-CM | POA: Diagnosis not present

## 2016-07-02 DIAGNOSIS — R490 Dysphonia: Secondary | ICD-10-CM | POA: Diagnosis not present

## 2016-07-02 DIAGNOSIS — R51 Headache: Secondary | ICD-10-CM | POA: Diagnosis not present

## 2016-07-02 DIAGNOSIS — J029 Acute pharyngitis, unspecified: Secondary | ICD-10-CM | POA: Diagnosis not present

## 2016-07-02 DIAGNOSIS — Z79899 Other long term (current) drug therapy: Secondary | ICD-10-CM | POA: Diagnosis not present

## 2016-07-02 DIAGNOSIS — Z888 Allergy status to other drugs, medicaments and biological substances status: Secondary | ICD-10-CM | POA: Diagnosis not present

## 2016-07-02 DIAGNOSIS — R070 Pain in throat: Secondary | ICD-10-CM | POA: Diagnosis not present

## 2016-07-06 ENCOUNTER — Ambulatory Visit: Payer: PPO

## 2016-07-10 ENCOUNTER — Emergency Department (HOSPITAL_COMMUNITY): Payer: PPO

## 2016-07-10 ENCOUNTER — Emergency Department (HOSPITAL_COMMUNITY)
Admission: EM | Admit: 2016-07-10 | Discharge: 2016-07-10 | Disposition: A | Discharge status: Home / Self Care | Payer: PPO | Attending: Emergency Medicine | Admitting: Emergency Medicine

## 2016-07-10 ENCOUNTER — Encounter (HOSPITAL_COMMUNITY): Payer: Self-pay | Admitting: Emergency Medicine

## 2016-07-10 DIAGNOSIS — J4540 Moderate persistent asthma, uncomplicated: Secondary | ICD-10-CM | POA: Diagnosis not present

## 2016-07-10 DIAGNOSIS — J029 Acute pharyngitis, unspecified: Secondary | ICD-10-CM | POA: Diagnosis not present

## 2016-07-10 DIAGNOSIS — Z79899 Other long term (current) drug therapy: Secondary | ICD-10-CM | POA: Diagnosis not present

## 2016-07-10 DIAGNOSIS — Z7982 Long term (current) use of aspirin: Secondary | ICD-10-CM | POA: Diagnosis not present

## 2016-07-10 DIAGNOSIS — Z955 Presence of coronary angioplasty implant and graft: Secondary | ICD-10-CM | POA: Diagnosis not present

## 2016-07-10 DIAGNOSIS — I2510 Atherosclerotic heart disease of native coronary artery without angina pectoris: Secondary | ICD-10-CM | POA: Insufficient documentation

## 2016-07-10 DIAGNOSIS — R066 Hiccough: Secondary | ICD-10-CM | POA: Diagnosis not present

## 2016-07-10 DIAGNOSIS — Z85828 Personal history of other malignant neoplasm of skin: Secondary | ICD-10-CM | POA: Diagnosis not present

## 2016-07-10 DIAGNOSIS — I10 Essential (primary) hypertension: Secondary | ICD-10-CM | POA: Diagnosis not present

## 2016-07-10 DIAGNOSIS — R05 Cough: Secondary | ICD-10-CM | POA: Diagnosis not present

## 2016-07-10 DIAGNOSIS — R0602 Shortness of breath: Secondary | ICD-10-CM | POA: Diagnosis not present

## 2016-07-10 DIAGNOSIS — R0600 Dyspnea, unspecified: Secondary | ICD-10-CM

## 2016-07-10 DIAGNOSIS — J069 Acute upper respiratory infection, unspecified: Secondary | ICD-10-CM | POA: Diagnosis not present

## 2016-07-10 DIAGNOSIS — E039 Hypothyroidism, unspecified: Secondary | ICD-10-CM | POA: Insufficient documentation

## 2016-07-10 LAB — CBC WITH DIFFERENTIAL/PLATELET
BASOS ABS: 0 10*3/uL (ref 0.0–0.1)
BASOS PCT: 0 %
EOS ABS: 0.2 10*3/uL (ref 0.0–0.7)
Eosinophils Relative: 2 %
HCT: 49.3 % (ref 39.0–52.0)
HEMOGLOBIN: 17 g/dL (ref 13.0–17.0)
Lymphocytes Relative: 15 %
Lymphs Abs: 1.3 10*3/uL (ref 0.7–4.0)
MCH: 31 pg (ref 26.0–34.0)
MCHC: 34.5 g/dL (ref 30.0–36.0)
MCV: 90 fL (ref 78.0–100.0)
Monocytes Absolute: 0.8 10*3/uL (ref 0.1–1.0)
Monocytes Relative: 10 %
NEUTROS PCT: 73 %
Neutro Abs: 6.3 10*3/uL (ref 1.7–7.7)
Platelets: 127 10*3/uL — ABNORMAL LOW (ref 150–400)
RBC: 5.48 MIL/uL (ref 4.22–5.81)
RDW: 14 % (ref 11.5–15.5)
WBC: 8.6 10*3/uL (ref 4.0–10.5)

## 2016-07-10 LAB — RAPID STREP SCREEN (MED CTR MEBANE ONLY): Streptococcus, Group A Screen (Direct): NEGATIVE

## 2016-07-10 LAB — URINALYSIS, ROUTINE W REFLEX MICROSCOPIC
Bilirubin Urine: NEGATIVE
GLUCOSE, UA: NEGATIVE mg/dL
Hgb urine dipstick: NEGATIVE
Ketones, ur: NEGATIVE mg/dL
LEUKOCYTES UA: NEGATIVE
Nitrite: NEGATIVE
PROTEIN: NEGATIVE mg/dL
SPECIFIC GRAVITY, URINE: 1.011 (ref 1.005–1.030)
pH: 7 (ref 5.0–8.0)

## 2016-07-10 LAB — COMPREHENSIVE METABOLIC PANEL
ALBUMIN: 3.8 g/dL (ref 3.5–5.0)
ALK PHOS: 47 U/L (ref 38–126)
ALT: 38 U/L (ref 17–63)
ANION GAP: 7 (ref 5–15)
AST: 33 U/L (ref 15–41)
BUN: 22 mg/dL — ABNORMAL HIGH (ref 6–20)
CALCIUM: 9.6 mg/dL (ref 8.9–10.3)
CO2: 29 mmol/L (ref 22–32)
CREATININE: 1.64 mg/dL — AB (ref 0.61–1.24)
Chloride: 102 mmol/L (ref 101–111)
GFR calc Af Amer: 46 mL/min — ABNORMAL LOW (ref >60)
GFR calc non Af Amer: 40 mL/min — ABNORMAL LOW (ref >60)
GLUCOSE: 99 mg/dL (ref 65–99)
Potassium: 4.4 mmol/L (ref 3.5–5.1)
SODIUM: 138 mmol/L (ref 135–145)
Total Bilirubin: 1 mg/dL (ref 0.3–1.2)
Total Protein: 6.3 g/dL — ABNORMAL LOW (ref 6.5–8.1)

## 2016-07-10 MED ORDER — SODIUM CHLORIDE 0.9 % IV BOLUS (SEPSIS): 1000.0000 mL | Freq: Once | INTRAVENOUS | Status: DC

## 2016-07-10 MED ORDER — SODIUM CHLORIDE 0.9 % IV BOLUS (SEPSIS)
1000.0000 mL | Freq: Once | INTRAVENOUS | Status: AC
  Administered 2016-07-10: 1000 mL via INTRAVENOUS

## 2016-07-10 MED ORDER — AZITHROMYCIN 250 MG PO TABS: ORAL_TABLET | ORAL | 0 refills | Status: DC

## 2016-07-10 MED ORDER — CHLORPROMAZINE HCL 25 MG PO TABS
25.0000 mg | ORAL_TABLET | Freq: Once | ORAL | Status: AC
  Administered 2016-07-10: 25 mg via ORAL
  Filled 2016-07-10: qty 1

## 2016-07-10 MED ORDER — ONDANSETRON HCL 4 MG/2ML IJ SOLN
4.0000 mg | Freq: Once | INTRAMUSCULAR | Status: AC
  Administered 2016-07-10: 4 mg via INTRAVENOUS
  Filled 2016-07-10: qty 2

## 2016-07-10 MED ORDER — MORPHINE SULFATE (PF) 4 MG/ML IV SOLN
4.0000 mg | Freq: Once | INTRAVENOUS | Status: AC
  Administered 2016-07-10: 4 mg via INTRAVENOUS
  Filled 2016-07-10: qty 1

## 2016-07-10 MED ORDER — METHYLPREDNISOLONE SODIUM SUCC 125 MG IJ SOLR
125.0000 mg | Freq: Once | INTRAMUSCULAR | Status: AC
  Administered 2016-07-10: 125 mg via INTRAVENOUS
  Filled 2016-07-10: qty 2

## 2016-07-10 MED ORDER — PREDNISONE 10 MG (21) PO TBPK: 10.0000 mg | ORAL_TABLET | Freq: Every day | ORAL | 0 refills | Status: DC

## 2016-07-10 MED ORDER — AZITHROMYCIN 250 MG PO TABS
500.0000 mg | ORAL_TABLET | Freq: Once | ORAL | Status: AC
  Administered 2016-07-10: 500 mg via ORAL
  Filled 2016-07-10: qty 2

## 2016-07-10 MED ORDER — DIPHENHYDRAMINE HCL 50 MG/ML IJ SOLN
25.0000 mg | Freq: Once | INTRAMUSCULAR | Status: AC
  Administered 2016-07-10: 25 mg via INTRAVENOUS
  Filled 2016-07-10: qty 1

## 2016-07-10 MED ORDER — ALBUTEROL (5 MG/ML) CONTINUOUS INHALATION SOLN
10.0000 mg/h | INHALATION_SOLUTION | RESPIRATORY_TRACT | Status: DC
  Administered 2016-07-10: 10 mg/h via RESPIRATORY_TRACT
  Filled 2016-07-10: qty 20

## 2016-07-10 MED ORDER — IOPAMIDOL (ISOVUE-370) INJECTION 76%
100.0000 mL | Freq: Once | INTRAVENOUS | Status: AC | PRN
  Administered 2016-07-10: 80 mL via INTRAVENOUS

## 2016-07-10 NOTE — ED Provider Notes (Signed)
[facility] DEPT Provider Note   CSN: [id number] Arrival date & time: 07/10/16  4098     History   Chief Complaint Chief Complaint  Patient presents with  . Cough  . Sore Throat    HPI Scott Oneal is a 75 y.o. male.  Pt has had a sore throat that started about 10 days ago.  He said that he was not able to eat or drink, so started taking an old rx of amox that he had at his house.  That has helped some, but not a lot.  He has also developed sob and a cough.  He said that he's not able to sleep.  He said that he's lost 12 pounds in the last 10 days as he has eaten so little.  Pt is also reporting hiccups.      Past Medical History:  Diagnosis Date  . Anxiety   . Arthritis   . Benign neoplasm of colon   . Calculus of gallbladder without mention of cholecystitis   . Cataract    bilateral cateracts removed  . Celiac disease   . Cholelithiasis   . Coronary atherosclerosis of unspecified type of vessel, native or graft   . Diarrhea   . Esophageal reflux   . Glaucoma   . Gout, unspecified   . Hyperparathyroidism   . Long term (current) use of anticoagulants   . Loss of weight   . Old myocardial infarction   . Osteoporosis, unspecified   . Other malaise and fatigue   . Other specified cardiac dysrhythmias(427.89)   . Other testicular hypofunction   . Personal history of colonic polyps   . Pure hypercholesterolemia   . Renal insufficiency    chronic  . Unspecified asthma(493.90)   . Unspecified essential hypertension   . Unspecified hypothyroidism     Patient Active Problem List   Diagnosis Date Noted  . Edema 06/06/2016  . Insomnia 06/06/2016  . Thigh pain 03/15/2016  . Tinnitus 09/15/2015  . Cramps of lower extremity 09/15/2015  . UTI (urinary tract infection) 06/15/2015  . Umbilical hernia 11/91/4782  . Lower GI bleed 11/26/2014  . Rectal bleeding 11/25/2014  . History of colonic polyps 09/12/2014  . Left foot pain 08/11/2014  . Right foot pain  06/10/2014  . Oral candidiasis 05/15/2014  . Rash 04/30/2014  . LBP (low back pain) 01/29/2014  . Anxiety disorder 11/27/2013  . Arthralgia 09/30/2013  . Preop exam for internal medicine 06/19/2013  . Preoperative clearance 06/19/2013  . Urinary frequency 12/12/2012  . Actinic keratoses 12/12/2012  . Thrombocytopenia ([place]) 11/19/2012  . Well adult exam 09/10/2012  . Shoulder pain, left 09/10/2012  . Warts 09/10/2012  . Headache(784.0) 11/29/2011  . Hyperkalemia 08/01/2011  . Neuropathy of hand 08/01/2011  . Numbness 04/28/2011  . Fatigue 04/28/2011  . Cellulitis of trunk 04/21/2011  . CHALAZION 07/07/2010  . Disorder resulting from impaired renal function 03/10/2010  . NEOPLASM OF UNCERTAIN BEHAVIOR OF SKIN 11/09/2009  . PSA, INCREASED 11/09/2009  . HYPERCHOLESTEROLEMIA 08/31/2009  . Hypogonadism in male 07/03/2008  . BRADYCARDIA 07/03/2008  . PERSONAL HX COLONIC POLYPS 07/02/2008  . OSTEOPOROSIS 06/03/2008  . CHOLELITHIASIS 04/29/2008  . CALCU GALLBLADD W/O MENTION CHOLECYST/OBST 04/29/2008  . Celiac disease 04/29/2008  . WEIGHT LOSS 04/08/2008  . GERD 04/02/2008  . TUBULOVILLOUS ADENOMA, COLON 04/01/2008  . Moderate persistent asthma without complication 95/62/1308  . DIARRHEA 01/31/2008  . Hypothyroidism 08/11/2007  . GOUT 08/11/2007  . Essential hypertension 08/11/2007  . MYOCARDIAL  INFARCTION, HX OF 08/11/2007  . Coronary atherosclerosis 08/11/2007    Past Surgical History:  Procedure Laterality Date  . COLON SURGERY    . COLONOSCOPY    . COLONOSCOPY WITH PROPOFOL N/A 11/27/2014   Procedure: COLONOSCOPY WITH PROPOFOL;  Surgeon: Milus Banister, MD;  Location: [facility];  Service: Endoscopy;  Laterality: N/A;  . CORONARY ANGIOPLASTY    . CORONARY STENT PLACEMENT  2011   Cypher; in distal circumflex artery  . CYSTOSCOPY  1998  . EYE SURGERY    . KNEE SURGERY     right  . SHOULDER SURGERY Left 9/14   Dr Shara Blazing  . UPPER GASTROINTESTINAL ENDOSCOPY    .  VASCULAR SURGERY         Home Medications    Prior to Admission medications   Medication Sig Start Date End Date Taking? Authorizing Provider  albuterol (PROAIR HFA) 108 (90 Base) MCG/ACT inhaler Inhale 2 puffs into the lungs every 6 (six) hours as needed for wheezing or shortness of breath. 12/16/15  Yes Evie Lacks Plotnikov, MD  amLODipine (NORVASC) 5 MG tablet Take 1 tablet (5 mg total) by mouth daily. 12/16/15  Yes Evie Lacks Plotnikov, MD  aspirin 81 MG tablet Take 81 mg by mouth every evening.    Yes Historical Provider, MD  B-D 3CC LUER-LOK SYR 22GX1" 22G X 1" 3 ML MISC Inject 1 each into the muscle every 14 (fourteen) days. 08/27/15  Yes Cassandria Anger, MD  budesonide-formoterol (SYMBICORT) 160-4.5 MCG/ACT inhaler Inhale 2 puffs into the lungs 2 (two) times daily. Patient taking differently: Inhale 2 puffs into the lungs 2 (two) times daily as needed (SOB, wheezing).  05/14/14  Yes Elsie Stain, MD  calcitRIOL (ROCALTROL) 0.25 MCG capsule Take 1 capsule (0.25 mcg total) by mouth daily. 07/22/15  Yes Evie Lacks Plotnikov, MD  carvedilol (COREG) 25 MG tablet TAKE 1 TABLET BY MOUTH TWICE A DAY WITH MEALS 10/20/15  Yes Cassandria Anger, MD  Cholecalciferol 2000 UNITS TABS Take 2,000 Units by mouth daily.    Yes Historical Provider, MD  clopidogrel (PLAVIX) 75 MG tablet Take 1 tablet (75 mg total) by mouth daily. 07/22/15  Yes Evie Lacks Plotnikov, MD  levothyroxine (SYNTHROID, LEVOTHROID) 150 MCG tablet Take 1 tablet (150 mcg total) by mouth daily. 07/29/15  Yes Evie Lacks Plotnikov, MD  nitroGLYCERIN (NITROSTAT) 0.4 MG SL tablet Place 1 tablet (0.4 mg total) under the tongue every 5 (five) minutes as needed. 09/15/15  Yes Evie Lacks Plotnikov, MD  olmesartan (BENICAR) 20 MG tablet Take 1 tablet (20 mg total) by mouth daily. 02/11/15  Yes Evie Lacks Plotnikov, MD  sildenafil (VIAGRA) 100 MG tablet Take 100 mg by mouth daily as needed for erectile dysfunction.    Yes Historical Provider, MD    simvastatin (ZOCOR) 10 MG tablet TAKE 1 TABLET AT BEDTIME Patient taking differently: TAKE 10 MG BY MOUTH AT BEDTIME 03/18/16  Yes Evie Lacks Plotnikov, MD  testosterone cypionate (DEPOTESTOSTERONE CYPIONATE) 200 MG/ML injection INJECT 2 ML INTO THE MUSCLE EVERY 2 WEEKS. 12/16/15  Yes Cassandria Anger, MD  azithromycin (ZITHROMAX) 250 MG tablet Take 1 pill daily 07/10/16   Isla Pence, MD  clonazePAM (KLONOPIN) 0.25 MG disintegrating tablet Take 1 tablet (0.25 mg total) by mouth 2 (two) times daily as needed (anxiety). Patient not taking: Reported on 06/06/2016 11/27/13   Cassandria Anger, MD  nortriptyline (PAMELOR) 10 MG capsule Take 1 capsule (10 mg total) by mouth at bedtime. Patient not  taking: Reported on 07/10/2016 06/06/16 06/06/17  Evie Lacks Plotnikov, MD  predniSONE (STERAPRED UNI-PAK 21 TAB) 10 MG (21) TBPK tablet Take 1 tablet (10 mg total) by mouth daily. Take 6 tabs by mouth daily  for 2 days, then 5 tabs for 2 days, then 4 tabs for 2 days, then 3 tabs for 2 days, 2 tabs for 2 days, then 1 tab by mouth daily for 2 days 07/10/16   Isla Pence, MD    Family History Family History  Problem Relation Age of Onset  . Lymphoma Father   . Cancer Father     lymphoma  . Hypertension    . Vision loss Maternal Grandmother   . Asthma Neg Hx   . Colon cancer Neg Hx   . Esophageal cancer Neg Hx   . Rectal cancer Neg Hx   . Stomach cancer Neg Hx     Social History Social History  Substance Use Topics  . Smoking status: Never Smoker  . Smokeless tobacco: Never Used  . Alcohol use No     Allergies   Allantoin-pramoxine and Metoprolol tartrate   Review of Systems Review of Systems  Constitutional: Positive for chills, fatigue and unexpected weight change.  HENT: Positive for sore throat, trouble swallowing and voice change.   Respiratory: Positive for cough, shortness of breath and stridor.   All other systems reviewed and are negative.    Physical Exam Updated Vital  Signs BP 167/94 (BP Location: Left Arm)   Pulse 103   Temp 98.5 F (36.9 C) (Oral)   Resp 18   Ht 6' (1.829 m)   Wt 210 lb (95.3 kg)   SpO2 100%   BMI 28.48 kg/m   Physical Exam  Constitutional: He is oriented to person, place, and time. He appears well-developed and well-nourished.  HENT:  Head: Normocephalic and atraumatic.  Right Ear: External ear normal.  Left Ear: External ear normal.  Nose: Nose normal.  Mouth/Throat: Posterior oropharyngeal erythema present.  Eyes: Conjunctivae and EOM are normal. Pupils are equal, round, and reactive to light.  Neck: Normal range of motion. Neck supple.  Cardiovascular: Regular rhythm, normal heart sounds and intact distal pulses.  Tachycardia present.   Pulmonary/Chest: Effort normal. He has wheezes. He exhibits tenderness.  Abdominal: Soft. Bowel sounds are normal.  Musculoskeletal: Normal range of motion.  Lymphadenopathy:    He has cervical adenopathy.  Neurological: He is alert and oriented to person, place, and time.  Skin: Skin is warm and dry.  Psychiatric: He has a normal mood and affect. His behavior is normal. Judgment and thought content normal.  Nursing note and vitals reviewed.    ED Treatments / Results  Labs (all labs ordered are listed, but only abnormal results are displayed) Labs Reviewed  CBC WITH DIFFERENTIAL/PLATELET - Abnormal; Notable for the following:       Result Value   Platelets 127 (*)    All other components within normal limits  COMPREHENSIVE METABOLIC PANEL - Abnormal; Notable for the following:    BUN 22 (*)    Creatinine, Ser 1.64 (*)    Total Protein 6.3 (*)    GFR calc non Af Amer 40 (*)    GFR calc Af Amer 46 (*)    All other components within normal limits  RAPID STREP SCREEN (NOT AT [facility])  CULTURE, GROUP A STREP ([place])  URINALYSIS, ROUTINE W REFLEX MICROSCOPIC (NOT AT [facility])    EKG  EKG Interpretation None  Radiology Dg Neck Soft Tissue  Result Date:  07/10/2016 CLINICAL DATA:  Sore throat. EXAM: NECK SOFT TISSUES - 1+ VIEW COMPARISON:  None. FINDINGS: Vascular calcifications are seen in the neck, likely carotid in origin. The soft tissues of the neck are normal. Degenerative changes seen in the cervical spine. IMPRESSION: No acute abnormalities. Electronically Signed   By: Dorise Bullion III M.D   On: 07/10/2016 11:01   Dg Chest 2 View  Result Date: 07/10/2016 CLINICAL DATA:  Sore throat and cough EXAM: CHEST  2 VIEW COMPARISON:  January 20, 2014 FINDINGS: The heart, hila, mediastinum, lungs, and pleura are unchanged. The left hemidiaphragm remains elevated but unchanged. IMPRESSION: No active cardiopulmonary disease. Electronically Signed   By: Dorise Bullion III M.D   On: 07/10/2016 11:00   Ct Angio Chest Pe W And/or Wo Contrast  Result Date: 07/10/2016 CLINICAL DATA:  Shortness of breath, positive D-dimer EXAM: CT ANGIOGRAPHY CHEST WITH CONTRAST TECHNIQUE: Multidetector CT imaging of the chest was performed using the standard protocol during bolus administration of intravenous contrast. Multiplanar CT image reconstructions and MIPs were obtained to evaluate the vascular anatomy. CONTRAST:  80 cc Isovue COMPARISON:  None. FINDINGS: Images of the thoracic inlet are unremarkable. Central airways are patent. No central pulmonary embolus is noted. Suboptimal study due to poor bolus contrast opacification of lobar and segmental arterial branches. Atherosclerotic calcifications of thoracic aorta and coronary arteries. Heart size within normal limits. No pericardial effusion. There is no mediastinal hematoma or adenopathy. Elevation of the left hemidiaphragm is noted. No adrenal gland mass is noted. Images of the lung parenchyma shows no acute infiltrate or pulmonary edema. No focal consolidation. No bronchiectasis. No fibrotic changes. No pneumothorax. Sagittal images of the spine shows degenerative changes thoracic spine. Sagittal view of the sternum is  unremarkable. Review of the MIP images confirms the above findings. IMPRESSION: 1. No central pulmonary embolus. 2. No mediastinal hematoma or adenopathy. 3. No acute infiltrate or pulmonary edema. 4. Degenerative changes thoracic spine. 5. Chronic elevation of the left hemidiaphragm. Electronically Signed   By: Lahoma Crocker M.D.   On: 07/10/2016 14:20    Procedures Procedures (including critical care time)  Medications Ordered in ED Medications  albuterol (PROVENTIL,VENTOLIN) solution continuous neb (0 mg/hr Nebulization Stopped 07/10/16 1212)  sodium chloride 0.9 % bolus 1,000 mL (not administered)  sodium chloride 0.9 % bolus 1,000 mL (0 mLs Intravenous Stopped 07/10/16 1138)  methylPREDNISolone sodium succinate (SOLU-MEDROL) 125 mg/2 mL injection 125 mg (125 mg Intravenous Given 07/10/16 1030)  morphine 4 MG/ML injection 4 mg (4 mg Intravenous Given 07/10/16 1033)  ondansetron (ZOFRAN) injection 4 mg (4 mg Intravenous Given 07/10/16 1030)  sodium chloride 0.9 % bolus 1,000 mL (1,000 mLs Intravenous New Bag/Given 07/10/16 1209)  chlorproMAZINE (THORAZINE) tablet 25 mg (25 mg Oral Given 07/10/16 1335)  diphenhydrAMINE (BENADRYL) injection 25 mg (25 mg Intravenous Given 07/10/16 1337)  azithromycin (ZITHROMAX) tablet 500 mg (500 mg Oral Given 07/10/16 1335)  iopamidol (ISOVUE-370) 76 % injection 100 mL (80 mLs Intravenous Contrast Given 07/10/16 1343)     Initial Impression / Assessment and Plan / ED Course  I have reviewed the triage vital signs and the nursing notes.  Pertinent labs & imaging results that were available during my care of the patient were reviewed by me and considered in my medical decision making (see chart for details).  Clinical Course   When reviewing pt's chart, I saw that he told his dr that he had 1 leg  that had been swelling up, so his dr told him to come in for a ddimer.  That was done and it was elevated.  His dr ordered a leg [place], but that has not yet been done.  I  ordered the ct because of that as well as the sob and the hiccups.  The pt feels much better after the nebs and the steroids.  Hiccups are gone after thorazine.    Pt's sx are likely viral, but have been going on for 10 days,so I will treat with zithromax.    He also had some diarrhea after the amox, so he is instr to take probiotics.  Pt knows to return if worse and to f/u with pcp.  Final Clinical Impressions(s) / ED Diagnoses   Final diagnoses:  URI (upper respiratory infection)  Hiccups  Dyspnea    New Prescriptions New Prescriptions   AZITHROMYCIN (ZITHROMAX) 250 MG TABLET    Take 1 pill daily   PREDNISONE (STERAPRED UNI-PAK 21 TAB) 10 MG (21) TBPK TABLET    Take 1 tablet (10 mg total) by mouth daily. Take 6 tabs by mouth daily  for 2 days, then 5 tabs for 2 days, then 4 tabs for 2 days, then 3 tabs for 2 days, 2 tabs for 2 days, then 1 tab by mouth daily for 2 days     Isla Pence, MD 07/10/16 1433

## 2016-07-10 NOTE — ED Notes (Signed)
Pt stayed at 98-100% pulse ox when ambulated.

## 2016-07-10 NOTE — ED Triage Notes (Signed)
Pt states non-productive cough and sore throat started 10 days ago, pt unable to eat / drink. Pt reports weight loss of 12 pounds in 10 days. Pt started Prednisone and Amoxicillin from a prior prescription approximately 10 days ago and only took 5 days of medications. Pt c/o fatigue.

## 2016-07-10 NOTE — ED Notes (Signed)
Pt reports urinary frequency over last week, reports concern of having a UTI.

## 2016-07-10 NOTE — ED Notes (Signed)
IV is actually placed in R antecube

## 2016-07-11 ENCOUNTER — Encounter: Payer: Self-pay | Admitting: Internal Medicine

## 2016-07-11 NOTE — Telephone Encounter (Signed)
Patient called in stating him and his wife have not felt good.  Patient states he had to get fluid at ER over the weekend b/c he was dehydrated.  Patient states he has lost 12 pounds in 8 days.  I have scheduled both patient and wife to see Wilfred Lacy tomorrow afternoon.  Patient is requesting follow up call in regard though.

## 2016-07-12 ENCOUNTER — Ambulatory Visit (INDEPENDENT_AMBULATORY_CARE_PROVIDER_SITE_OTHER): Payer: PPO | Admitting: Nurse Practitioner

## 2016-07-12 ENCOUNTER — Encounter: Payer: Self-pay | Admitting: Nurse Practitioner

## 2016-07-12 ENCOUNTER — Ambulatory Visit: Payer: PPO | Admitting: Nurse Practitioner

## 2016-07-12 VITALS — BP 152/90 | HR 88 | Temp 97.9°F | Ht 72.0 in | Wt 210.0 lb

## 2016-07-12 DIAGNOSIS — J209 Acute bronchitis, unspecified: Secondary | ICD-10-CM

## 2016-07-12 DIAGNOSIS — B370 Candidal stomatitis: Secondary | ICD-10-CM | POA: Diagnosis not present

## 2016-07-12 MED ORDER — NYSTATIN 100000 UNIT/ML MT SUSP: 5.0000 mL | Freq: Three times a day (TID) | OROMUCOSAL | 0 refills | Status: DC

## 2016-07-12 MED ORDER — PROMETHAZINE-DM 6.25-15 MG/5ML PO SYRP: 5.0000 mL | ORAL_SOLUTION | Freq: Three times a day (TID) | ORAL | 0 refills | Status: DC | PRN

## 2016-07-12 NOTE — Patient Instructions (Signed)
Complete oral antibiotics and oral prednisone as prescribed. Encourage adequate oral hydration. Use albuterol inhaler as prescribed. Return to office if no improvement in 2 weeks.

## 2016-07-12 NOTE — Progress Notes (Signed)
Pre visit review using our clinic review tool, if applicable. No additional management support is needed unless otherwise documented below in the visit note. 

## 2016-07-12 NOTE — Progress Notes (Signed)
Subjective:  Patient ID: Scott Oneal, male    DOB: 1940/12/20  Age: 75 y.o. MRN: [id number]  CC: URI (ER - Dx with URI. Has unproductive cough and hiccups)   URI   This is a new problem. The current episode started in the past 7 days. Progression since onset: Complaints of persistent nonproductive cough and hiccups. There has been no fever. Associated symptoms include congestion, coughing and a sore throat. Pertinent negatives include no abdominal pain, chest pain, diarrhea, dysuria, ear pain, headaches, nausea, plugged ear sensation, rhinorrhea, sinus pain, sneezing, swollen glands, vomiting or wheezing. Treatments tried: Current use of azithromycin and oral prednisone. The treatment provided mild relief.  He was given intolerance in in the hospital 07/10/2016, which helped with hiccups. He is requesting for an additional prescription of thorazine. No nausea, no abdominal pain, no vomiting  Outpatient Medications Prior to Visit  Medication Sig Dispense Refill  . albuterol (PROAIR HFA) 108 (90 Base) MCG/ACT inhaler Inhale 2 puffs into the lungs every 6 (six) hours as needed for wheezing or shortness of breath. 3 Inhaler 3  . amLODipine (NORVASC) 5 MG tablet Take 1 tablet (5 mg total) by mouth daily. 90 tablet 3  . aspirin 81 MG tablet Take 81 mg by mouth every evening.     Marland Kitchen azithromycin (ZITHROMAX) 250 MG tablet Take 1 pill daily 4 each 0  . B-D 3CC LUER-LOK SYR 22GX1" 22G X 1" 3 ML MISC Inject 1 each into the muscle every 14 (fourteen) days. 10 each 3  . budesonide-formoterol (SYMBICORT) 160-4.5 MCG/ACT inhaler Inhale 2 puffs into the lungs 2 (two) times daily. (Patient taking differently: Inhale 2 puffs into the lungs 2 (two) times daily as needed (SOB, wheezing). ) 2 Inhaler 6  . calcitRIOL (ROCALTROL) 0.25 MCG capsule Take 1 capsule (0.25 mcg total) by mouth daily. 90 capsule 3  . carvedilol (COREG) 25 MG tablet TAKE 1 TABLET BY MOUTH TWICE A DAY WITH MEALS 180 tablet 2  .  Cholecalciferol 2000 UNITS TABS Take 2,000 Units by mouth daily.     . clonazePAM (KLONOPIN) 0.25 MG disintegrating tablet Take 1 tablet (0.25 mg total) by mouth 2 (two) times daily as needed (anxiety). 60 tablet 1  . clopidogrel (PLAVIX) 75 MG tablet Take 1 tablet (75 mg total) by mouth daily. 90 tablet 3  . levothyroxine (SYNTHROID, LEVOTHROID) 150 MCG tablet Take 1 tablet (150 mcg total) by mouth daily. 90 tablet 3  . nitroGLYCERIN (NITROSTAT) 0.4 MG SL tablet Place 1 tablet (0.4 mg total) under the tongue every 5 (five) minutes as needed. 20 tablet 3  . nortriptyline (PAMELOR) 10 MG capsule Take 1 capsule (10 mg total) by mouth at bedtime. 30 capsule 5  . olmesartan (BENICAR) 20 MG tablet Take 1 tablet (20 mg total) by mouth daily. 90 tablet 3  . predniSONE (STERAPRED UNI-PAK 21 TAB) 10 MG (21) TBPK tablet Take 1 tablet (10 mg total) by mouth daily. Take 6 tabs by mouth daily  for 2 days, then 5 tabs for 2 days, then 4 tabs for 2 days, then 3 tabs for 2 days, 2 tabs for 2 days, then 1 tab by mouth daily for 2 days 42 tablet 0  . sildenafil (VIAGRA) 100 MG tablet Take 100 mg by mouth daily as needed for erectile dysfunction.     . simvastatin (ZOCOR) 10 MG tablet TAKE 1 TABLET AT BEDTIME (Patient taking differently: TAKE 10 MG BY MOUTH AT BEDTIME) 90 tablet 3  .  testosterone cypionate (DEPOTESTOSTERONE CYPIONATE) 200 MG/ML injection INJECT 2 ML INTO THE MUSCLE EVERY 2 WEEKS. 12 mL 5   No facility-administered medications prior to visit.     ROS See HPI  Objective:  BP (!) 152/90 (BP Location: Left Arm, Patient Position: Sitting, Cuff Size: Normal)   Pulse 88   Temp 97.9 F (36.6 C) (Oral)   Ht 6' (1.829 m)   Wt 210 lb (95.3 kg)   SpO2 97%   BMI 28.48 kg/m   BP Readings from Last 3 Encounters:  07/12/16 (!) 152/90  07/10/16 119/74  06/06/16 128/78    Wt Readings from Last 3 Encounters:  07/12/16 210 lb (95.3 kg)  07/10/16 210 lb (95.3 kg)  06/06/16 221 lb (100.2 kg)     Physical Exam  Constitutional: He is oriented to person, place, and time. No distress.  HENT:  Right Ear: External ear normal.  Left Ear: External ear normal.  Nose: Nose normal.  Mouth/Throat: Uvula is midline. Posterior oropharyngeal erythema present.  Buccal mucosa and tongue is erythematous and covered with white patches.  Neck: Normal range of motion. Neck supple.  Cardiovascular: Normal rate and normal heart sounds.   Pulmonary/Chest: Effort normal and breath sounds normal.  Lymphadenopathy:    He has no cervical adenopathy.  Neurological: He is alert and oriented to person, place, and time.  Skin: Skin is warm and dry.  Vitals reviewed.   Lab Results  Component Value Date   WBC 8.6 07/10/2016   HGB 17.0 07/10/2016   HCT 49.3 07/10/2016   PLT 127 (L) 07/10/2016   GLUCOSE 99 07/10/2016   CHOL 131 02/12/2015   TRIG 98.0 02/12/2015   HDL 37.50 (L) 02/12/2015   LDLCALC 74 02/12/2015   ALT 38 07/10/2016   AST 33 07/10/2016   NA 138 07/10/2016   K 4.4 07/10/2016   CL 102 07/10/2016   CREATININE 1.64 (H) 07/10/2016   BUN 22 (H) 07/10/2016   CO2 29 07/10/2016   TSH 2.44 06/30/2016   PSA 2.51 02/12/2015   INR 1.20 11/26/2014   HGBA1C 5.9 03/15/2016    Dg Neck Soft Tissue  Result Date: 07/10/2016 CLINICAL DATA:  Sore throat. EXAM: NECK SOFT TISSUES - 1+ VIEW COMPARISON:  None. FINDINGS: Vascular calcifications are seen in the neck, likely carotid in origin. The soft tissues of the neck are normal. Degenerative changes seen in the cervical spine. IMPRESSION: No acute abnormalities. Electronically Signed   By: Dorise Bullion III M.D   On: 07/10/2016 11:01   Dg Chest 2 View  Result Date: 07/10/2016 CLINICAL DATA:  Sore throat and cough EXAM: CHEST  2 VIEW COMPARISON:  January 20, 2014 FINDINGS: The heart, hila, mediastinum, lungs, and pleura are unchanged. The left hemidiaphragm remains elevated but unchanged. IMPRESSION: No active cardiopulmonary disease.  Electronically Signed   By: Dorise Bullion III M.D   On: 07/10/2016 11:00   Ct Angio Chest Pe W And/or Wo Contrast  Result Date: 07/10/2016 CLINICAL DATA:  Shortness of breath, positive D-dimer EXAM: CT ANGIOGRAPHY CHEST WITH CONTRAST TECHNIQUE: Multidetector CT imaging of the chest was performed using the standard protocol during bolus administration of intravenous contrast. Multiplanar CT image reconstructions and MIPs were obtained to evaluate the vascular anatomy. CONTRAST:  80 cc Isovue COMPARISON:  None. FINDINGS: Images of the thoracic inlet are unremarkable. Central airways are patent. No central pulmonary embolus is noted. Suboptimal study due to poor bolus contrast opacification of lobar and segmental arterial branches. Atherosclerotic calcifications of  thoracic aorta and coronary arteries. Heart size within normal limits. No pericardial effusion. There is no mediastinal hematoma or adenopathy. Elevation of the left hemidiaphragm is noted. No adrenal gland mass is noted. Images of the lung parenchyma shows no acute infiltrate or pulmonary edema. No focal consolidation. No bronchiectasis. No fibrotic changes. No pneumothorax. Sagittal images of the spine shows degenerative changes thoracic spine. Sagittal view of the sternum is unremarkable. Review of the MIP images confirms the above findings. IMPRESSION: 1. No central pulmonary embolus. 2. No mediastinal hematoma or adenopathy. 3. No acute infiltrate or pulmonary edema. 4. Degenerative changes thoracic spine. 5. Chronic elevation of the left hemidiaphragm. Electronically Signed   By: Lahoma Crocker M.D.   On: 07/10/2016 14:20   Reviewed emergency Department discharge summary, radiology reports and lab results from 07/10/2016.  Assessment & Plan:   Theo was seen today for uri.  Diagnoses and all orders for this visit:  Acute bronchitis, unspecified organism  Candida infection, oral  Other orders -     nystatin (MYCOSTATIN) 100000 UNIT/ML  suspension; Use as directed 5 mLs (500,000 Units total) in the mouth or throat 3 (three) times daily between meals. Gaggles and swalloe -     promethazine-dextromethorphan (PROMETHAZINE-DM) 6.25-15 MG/5ML syrup; Take 5 mLs by mouth 3 (three) times daily as needed for cough.   I am having Mr. Mayabb start on nystatin and promethazine-dextromethorphan. I am also having him maintain his sildenafil, clonazePAM, budesonide-formoterol, olmesartan, aspirin, calcitRIOL, clopidogrel, levothyroxine, B-D 3CC LUER-LOK SYR 22GX1", Cholecalciferol, nitroGLYCERIN, carvedilol, albuterol, testosterone cypionate, amLODipine, simvastatin, nortriptyline, azithromycin, and predniSONE.  Meds ordered this encounter  Medications  . nystatin (MYCOSTATIN) 100000 UNIT/ML suspension    Sig: Use as directed 5 mLs (500,000 Units total) in the mouth or throat 3 (three) times daily between meals. Gaggles and swalloe    Dispense:  60 mL    Refill:  0    Order Specific Question:   Supervising Provider    Answer:   Cassandria Anger [[id number]]  . promethazine-dextromethorphan (PROMETHAZINE-DM) 6.25-15 MG/5ML syrup    Sig: Take 5 mLs by mouth 3 (three) times daily as needed for cough.    Dispense:  240 mL    Refill:  0    Order Specific Question:   Supervising Provider    Answer:   Cassandria Anger [[id number]]    Follow-up: Return if symptoms worsen or fail to improve.  Wilfred Lacy, NP

## 2016-07-13 LAB — CULTURE, GROUP A STREP (THRC)

## 2016-07-14 ENCOUNTER — Encounter: Payer: Self-pay | Admitting: Internal Medicine

## 2016-07-14 ENCOUNTER — Ambulatory Visit (INDEPENDENT_AMBULATORY_CARE_PROVIDER_SITE_OTHER): Payer: PPO | Admitting: Internal Medicine

## 2016-07-14 DIAGNOSIS — K2100 Gastro-esophageal reflux disease with esophagitis, without bleeding: Secondary | ICD-10-CM

## 2016-07-14 DIAGNOSIS — R066 Hiccough: Secondary | ICD-10-CM | POA: Diagnosis not present

## 2016-07-14 DIAGNOSIS — J4540 Moderate persistent asthma, uncomplicated: Secondary | ICD-10-CM | POA: Diagnosis not present

## 2016-07-14 MED ORDER — HYDROCODONE-HOMATROPINE 5-1.5 MG/5ML PO SYRP: 5.0000 mL | ORAL_SOLUTION | Freq: Four times a day (QID) | ORAL | 0 refills | Status: DC | PRN

## 2016-07-14 MED ORDER — PANTOPRAZOLE SODIUM 40 MG PO TBEC: 40.0000 mg | DELAYED_RELEASE_TABLET | Freq: Every day | ORAL | 3 refills | Status: DC

## 2016-07-14 MED ORDER — CHLORPROMAZINE HCL 25 MG PO TABS: 25.0000 mg | ORAL_TABLET | Freq: Four times a day (QID) | ORAL | 0 refills | Status: DC | PRN

## 2016-07-14 MED ORDER — FLUTICASONE FUROATE-VILANTEROL 100-25 MCG/INH IN AEPB: 1.0000 | INHALATION_SPRAY | Freq: Every day | RESPIRATORY_TRACT | 5 refills | Status: DC

## 2016-07-14 NOTE — Assessment & Plan Note (Signed)
Worse Re-start Protonix

## 2016-07-14 NOTE — Assessment & Plan Note (Addendum)
Proair, start Breo Hydrocodone cough syr prn CT sinuses if needed (He has been off Symbicort inhaler x months)

## 2016-07-14 NOTE — Progress Notes (Signed)
Pre visit review using our clinic review tool, if applicable. No additional management support is needed unless otherwise documented below in the visit note. 

## 2016-07-14 NOTE — Assessment & Plan Note (Addendum)
Severe acute w/a URI Treat URI, asthma Thorazine prn w/caution  Potential benefits of thorazine use as well as potential risks  and complications were explained to the patient and were aknowledged. Treat GERD CT head if not better

## 2016-07-14 NOTE — Progress Notes (Signed)
Subjective:  Patient ID: Scott Oneal, male    DOB: 02/01/1941  Age: 75 y.o. MRN: [id number]  CC: Hiccups (x 6 days. He states the only night he has not had them was Sunday. He was able sleep all night) and Cough (non productive)   Cough  Pertinent negatives include no chest pain, rash or sore throat.   Scott Oneal presents for URI sx's x 2 weeks - coughing and hiccups x 6 days; worse. He got Thorazine once - it helped.Marland Kitchen He finished Zpac. He is still on Prednisone taper. CT angio was ok C/o being off balance x 1 week ST is better Mild HA  Outpatient Medications Prior to Visit  Medication Sig Dispense Refill  . albuterol (PROAIR HFA) 108 (90 Base) MCG/ACT inhaler Inhale 2 puffs into the lungs every 6 (six) hours as needed for wheezing or shortness of breath. 3 Inhaler 3  . amLODipine (NORVASC) 5 MG tablet Take 1 tablet (5 mg total) by mouth daily. 90 tablet 3  . aspirin 81 MG tablet Take 81 mg by mouth every evening.     Marland Kitchen azithromycin (ZITHROMAX) 250 MG tablet Take 1 pill daily 4 each 0  . B-D 3CC LUER-LOK SYR 22GX1" 22G X 1" 3 ML MISC Inject 1 each into the muscle every 14 (fourteen) days. 10 each 3  . budesonide-formoterol (SYMBICORT) 160-4.5 MCG/ACT inhaler Inhale 2 puffs into the lungs 2 (two) times daily. (Patient taking differently: Inhale 2 puffs into the lungs 2 (two) times daily as needed (SOB, wheezing). ) 2 Inhaler 6  . calcitRIOL (ROCALTROL) 0.25 MCG capsule Take 1 capsule (0.25 mcg total) by mouth daily. 90 capsule 3  . carvedilol (COREG) 25 MG tablet TAKE 1 TABLET BY MOUTH TWICE A DAY WITH MEALS 180 tablet 2  . Cholecalciferol 2000 UNITS TABS Take 2,000 Units by mouth daily.     . clonazePAM (KLONOPIN) 0.25 MG disintegrating tablet Take 1 tablet (0.25 mg total) by mouth 2 (two) times daily as needed (anxiety). 60 tablet 1  . clopidogrel (PLAVIX) 75 MG tablet Take 1 tablet (75 mg total) by mouth daily. 90 tablet 3  . levothyroxine (SYNTHROID, LEVOTHROID) 150 MCG tablet  Take 1 tablet (150 mcg total) by mouth daily. 90 tablet 3  . nitroGLYCERIN (NITROSTAT) 0.4 MG SL tablet Place 1 tablet (0.4 mg total) under the tongue every 5 (five) minutes as needed. 20 tablet 3  . nortriptyline (PAMELOR) 10 MG capsule Take 1 capsule (10 mg total) by mouth at bedtime. 30 capsule 5  . nystatin (MYCOSTATIN) 100000 UNIT/ML suspension Use as directed 5 mLs (500,000 Units total) in the mouth or throat 3 (three) times daily between meals. Gaggles and swalloe 60 mL 0  . olmesartan (BENICAR) 20 MG tablet Take 1 tablet (20 mg total) by mouth daily. 90 tablet 3  . predniSONE (STERAPRED UNI-PAK 21 TAB) 10 MG (21) TBPK tablet Take 1 tablet (10 mg total) by mouth daily. Take 6 tabs by mouth daily  for 2 days, then 5 tabs for 2 days, then 4 tabs for 2 days, then 3 tabs for 2 days, 2 tabs for 2 days, then 1 tab by mouth daily for 2 days 42 tablet 0  . promethazine-dextromethorphan (PROMETHAZINE-DM) 6.25-15 MG/5ML syrup Take 5 mLs by mouth 3 (three) times daily as needed for cough. 240 mL 0  . sildenafil (VIAGRA) 100 MG tablet Take 100 mg by mouth daily as needed for erectile dysfunction.     . simvastatin (ZOCOR)  10 MG tablet TAKE 1 TABLET AT BEDTIME (Patient taking differently: TAKE 10 MG BY MOUTH AT BEDTIME) 90 tablet 3  . testosterone cypionate (DEPOTESTOSTERONE CYPIONATE) 200 MG/ML injection INJECT 2 ML INTO THE MUSCLE EVERY 2 WEEKS. 12 mL 5   No facility-administered medications prior to visit.     ROS Review of Systems  Constitutional: Positive for fatigue and unexpected weight change. Negative for appetite change.  HENT: Negative for congestion, nosebleeds, sneezing, sore throat and trouble swallowing.   Eyes: Negative for itching and visual disturbance.  Respiratory: Positive for cough.   Cardiovascular: Negative for chest pain, palpitations and leg swelling.  Gastrointestinal: Negative for abdominal distention, blood in stool, diarrhea and nausea.  Genitourinary: Negative for  frequency and hematuria.  Musculoskeletal: Negative for back pain, gait problem, joint swelling and neck pain.  Skin: Negative for rash.  Neurological: Negative for dizziness, tremors, speech difficulty and weakness.  Psychiatric/Behavioral: Positive for sleep disturbance. Negative for agitation and dysphoric mood. The patient is not nervous/anxious.     Objective:  BP (!) 150/90   Pulse 82   Temp 98.3 F (36.8 C) (Oral)   Wt 212 lb (96.2 kg)   SpO2 97%   BMI 28.75 kg/m   BP Readings from Last 3 Encounters:  07/14/16 (!) 150/90  07/12/16 (!) 152/90  07/10/16 119/74    Wt Readings from Last 3 Encounters:  07/14/16 212 lb (96.2 kg)  07/12/16 210 lb (95.3 kg)  07/10/16 210 lb (95.3 kg)    Physical Exam  Constitutional: He is oriented to person, place, and time. He appears well-developed. No distress.  NAD  HENT:  Mouth/Throat: Oropharynx is clear and moist.  Eyes: Conjunctivae are normal. Pupils are equal, round, and reactive to light.  Neck: Normal range of motion. No JVD present. No thyromegaly present.  Cardiovascular: Normal rate, regular rhythm, normal heart sounds and intact distal pulses.  Exam reveals no gallop and no friction rub.   No murmur heard. Pulmonary/Chest: Effort normal and breath sounds normal. No respiratory distress. He has no wheezes. He has no rales. He exhibits no tenderness.  Abdominal: Soft. Bowel sounds are normal. He exhibits no distension and no mass. There is no tenderness. There is no rebound and no guarding.  Musculoskeletal: Normal range of motion. He exhibits no edema or tenderness.  Lymphadenopathy:    He has no cervical adenopathy.  Neurological: He is alert and oriented to person, place, and time. He has normal reflexes. No cranial nerve deficit. He exhibits normal muscle tone. He displays a negative Romberg sign. Coordination and gait normal.  Skin: Skin is warm and dry. No rash noted.  Psychiatric: He has a normal mood and affect. His  behavior is normal. Judgment and thought content normal.  No pronator drift Romberg is (-)  Lab Results  Component Value Date   WBC 8.6 07/10/2016   HGB 17.0 07/10/2016   HCT 49.3 07/10/2016   PLT 127 (L) 07/10/2016   GLUCOSE 99 07/10/2016   CHOL 131 02/12/2015   TRIG 98.0 02/12/2015   HDL 37.50 (L) 02/12/2015   LDLCALC 74 02/12/2015   ALT 38 07/10/2016   AST 33 07/10/2016   NA 138 07/10/2016   K 4.4 07/10/2016   CL 102 07/10/2016   CREATININE 1.64 (H) 07/10/2016   BUN 22 (H) 07/10/2016   CO2 29 07/10/2016   TSH 2.44 06/30/2016   PSA 2.51 02/12/2015   INR 1.20 11/26/2014   HGBA1C 5.9 03/15/2016    Dg Neck  Soft Tissue  Result Date: 07/10/2016 CLINICAL DATA:  Sore throat. EXAM: NECK SOFT TISSUES - 1+ VIEW COMPARISON:  None. FINDINGS: Vascular calcifications are seen in the neck, likely carotid in origin. The soft tissues of the neck are normal. Degenerative changes seen in the cervical spine. IMPRESSION: No acute abnormalities. Electronically Signed   By: Dorise Bullion III M.D   On: 07/10/2016 11:01   Dg Chest 2 View  Result Date: 07/10/2016 CLINICAL DATA:  Sore throat and cough EXAM: CHEST  2 VIEW COMPARISON:  January 20, 2014 FINDINGS: The heart, hila, mediastinum, lungs, and pleura are unchanged. The left hemidiaphragm remains elevated but unchanged. IMPRESSION: No active cardiopulmonary disease. Electronically Signed   By: Dorise Bullion III M.D   On: 07/10/2016 11:00   Ct Angio Chest Pe W And/or Wo Contrast  Result Date: 07/10/2016 CLINICAL DATA:  Shortness of breath, positive D-dimer EXAM: CT ANGIOGRAPHY CHEST WITH CONTRAST TECHNIQUE: Multidetector CT imaging of the chest was performed using the standard protocol during bolus administration of intravenous contrast. Multiplanar CT image reconstructions and MIPs were obtained to evaluate the vascular anatomy. CONTRAST:  80 cc Isovue COMPARISON:  None. FINDINGS: Images of the thoracic inlet are unremarkable. Central airways  are patent. No central pulmonary embolus is noted. Suboptimal study due to poor bolus contrast opacification of lobar and segmental arterial branches. Atherosclerotic calcifications of thoracic aorta and coronary arteries. Heart size within normal limits. No pericardial effusion. There is no mediastinal hematoma or adenopathy. Elevation of the left hemidiaphragm is noted. No adrenal gland mass is noted. Images of the lung parenchyma shows no acute infiltrate or pulmonary edema. No focal consolidation. No bronchiectasis. No fibrotic changes. No pneumothorax. Sagittal images of the spine shows degenerative changes thoracic spine. Sagittal view of the sternum is unremarkable. Review of the MIP images confirms the above findings. IMPRESSION: 1. No central pulmonary embolus. 2. No mediastinal hematoma or adenopathy. 3. No acute infiltrate or pulmonary edema. 4. Degenerative changes thoracic spine. 5. Chronic elevation of the left hemidiaphragm. Electronically Signed   By: Lahoma Crocker M.D.   On: 07/10/2016 14:20    Assessment & Plan:   There are no diagnoses linked to this encounter. I am having Scott Oneal maintain his sildenafil, clonazePAM, budesonide-formoterol, olmesartan, aspirin, calcitRIOL, clopidogrel, levothyroxine, B-D 3CC LUER-LOK SYR 22GX1", Cholecalciferol, nitroGLYCERIN, carvedilol, albuterol, testosterone cypionate, amLODipine, simvastatin, nortriptyline, azithromycin, predniSONE, nystatin, and promethazine-dextromethorphan.  No orders of the defined types were placed in this encounter.    Follow-up: No Follow-up on file.  Walker Kehr, MD

## 2016-07-19 ENCOUNTER — Encounter: Payer: Self-pay | Admitting: Internal Medicine

## 2016-07-19 ENCOUNTER — Telehealth: Payer: Self-pay

## 2016-07-19 NOTE — Telephone Encounter (Signed)
Discussed with pt - see phone note 07/19/2016, PA started

## 2016-07-19 NOTE — Telephone Encounter (Signed)
PA initiated via CoverMyMeds key [id number]

## 2016-07-21 ENCOUNTER — Ambulatory Visit: Payer: PPO | Admitting: Internal Medicine

## 2016-07-22 NOTE — Telephone Encounter (Signed)
PA APPROVED 07/20/2016 - 07/19/2116. Pt advised of same

## 2016-07-26 ENCOUNTER — Ambulatory Visit (HOSPITAL_COMMUNITY)
Admission: RE | Admit: 2016-07-26 | Discharge: 2016-07-26 | Disposition: A | Discharge status: Home / Self Care | Payer: PPO | Source: Ambulatory Visit | Attending: Internal Medicine | Admitting: Internal Medicine

## 2016-07-26 DIAGNOSIS — I2510 Atherosclerotic heart disease of native coronary artery without angina pectoris: Secondary | ICD-10-CM | POA: Diagnosis not present

## 2016-07-26 DIAGNOSIS — R600 Localized edema: Secondary | ICD-10-CM | POA: Insufficient documentation

## 2016-07-26 DIAGNOSIS — I82811 Embolism and thrombosis of superficial veins of right lower extremities: Secondary | ICD-10-CM | POA: Diagnosis not present

## 2016-07-26 DIAGNOSIS — E785 Hyperlipidemia, unspecified: Secondary | ICD-10-CM | POA: Diagnosis not present

## 2016-07-26 DIAGNOSIS — I10 Essential (primary) hypertension: Secondary | ICD-10-CM | POA: Insufficient documentation

## 2016-07-27 ENCOUNTER — Other Ambulatory Visit: Payer: Self-pay | Admitting: Internal Medicine

## 2016-08-01 ENCOUNTER — Ambulatory Visit (INDEPENDENT_AMBULATORY_CARE_PROVIDER_SITE_OTHER): Payer: PPO

## 2016-08-01 DIAGNOSIS — Z23 Encounter for immunization: Secondary | ICD-10-CM

## 2016-08-08 ENCOUNTER — Other Ambulatory Visit: Payer: Self-pay | Admitting: Internal Medicine

## 2016-08-10 ENCOUNTER — Other Ambulatory Visit: Payer: Self-pay | Admitting: Internal Medicine

## 2016-08-11 NOTE — Telephone Encounter (Signed)
Called refilled into [facility] had to leave on pharmacy vm...Scott Oneal

## 2016-08-22 ENCOUNTER — Other Ambulatory Visit (INDEPENDENT_AMBULATORY_CARE_PROVIDER_SITE_OTHER): Payer: PPO

## 2016-08-22 ENCOUNTER — Ambulatory Visit (INDEPENDENT_AMBULATORY_CARE_PROVIDER_SITE_OTHER): Payer: PPO | Admitting: Internal Medicine

## 2016-08-22 ENCOUNTER — Encounter: Payer: Self-pay | Admitting: Internal Medicine

## 2016-08-22 VITALS — BP 130/88 | HR 65 | Wt 218.0 lb

## 2016-08-22 DIAGNOSIS — N183 Chronic kidney disease, stage 3 unspecified: Secondary | ICD-10-CM

## 2016-08-22 DIAGNOSIS — K219 Gastro-esophageal reflux disease without esophagitis: Secondary | ICD-10-CM | POA: Diagnosis not present

## 2016-08-22 DIAGNOSIS — M545 Low back pain: Secondary | ICD-10-CM

## 2016-08-22 DIAGNOSIS — M109 Gout, unspecified: Secondary | ICD-10-CM

## 2016-08-22 DIAGNOSIS — E291 Testicular hypofunction: Secondary | ICD-10-CM

## 2016-08-22 DIAGNOSIS — I10 Essential (primary) hypertension: Secondary | ICD-10-CM

## 2016-08-22 DIAGNOSIS — B370 Candidal stomatitis: Secondary | ICD-10-CM | POA: Diagnosis not present

## 2016-08-22 DIAGNOSIS — J4540 Moderate persistent asthma, uncomplicated: Secondary | ICD-10-CM

## 2016-08-22 DIAGNOSIS — G8929 Other chronic pain: Secondary | ICD-10-CM

## 2016-08-22 DIAGNOSIS — R972 Elevated prostate specific antigen [PSA]: Secondary | ICD-10-CM | POA: Diagnosis not present

## 2016-08-22 LAB — CBC WITH DIFFERENTIAL/PLATELET
BASOS ABS: 0 10*3/uL (ref 0.0–0.1)
Basophils Relative: 0.7 % (ref 0.0–3.0)
EOS PCT: 5.4 % — AB (ref 0.0–5.0)
Eosinophils Absolute: 0.3 10*3/uL (ref 0.0–0.7)
HCT: 48.6 % (ref 39.0–52.0)
HEMOGLOBIN: 16.3 g/dL (ref 13.0–17.0)
Lymphocytes Relative: 23.5 % (ref 12.0–46.0)
Lymphs Abs: 1.5 10*3/uL (ref 0.7–4.0)
MCHC: 33.5 g/dL (ref 30.0–36.0)
MCV: 91.4 fl (ref 78.0–100.0)
MONO ABS: 0.7 10*3/uL (ref 0.1–1.0)
Monocytes Relative: 10.7 % (ref 3.0–12.0)
Neutro Abs: 3.7 10*3/uL (ref 1.4–7.7)
Neutrophils Relative %: 59.7 % (ref 43.0–77.0)
Platelets: 123 10*3/uL — ABNORMAL LOW (ref 150.0–400.0)
RBC: 5.32 Mil/uL (ref 4.22–5.81)
RDW: 15.7 % — ABNORMAL HIGH (ref 11.5–15.5)
WBC: 6.2 10*3/uL (ref 4.0–10.5)

## 2016-08-22 LAB — BASIC METABOLIC PANEL
BUN: 16 mg/dL (ref 6–23)
CALCIUM: 9.4 mg/dL (ref 8.4–10.5)
CO2: 33 mEq/L — ABNORMAL HIGH (ref 19–32)
Chloride: 105 mEq/L (ref 96–112)
Creatinine, Ser: 2.21 mg/dL — ABNORMAL HIGH (ref 0.40–1.50)
GFR: 31.01 mL/min — AB (ref >60.00)
GLUCOSE: 86 mg/dL (ref 70–99)
POTASSIUM: 4.7 meq/L (ref 3.5–5.1)
Sodium: 142 mEq/L (ref 135–145)

## 2016-08-22 LAB — HEPATIC FUNCTION PANEL
ALBUMIN: 4 g/dL (ref 3.5–5.2)
ALT: 16 U/L (ref 0–53)
AST: 22 U/L (ref 0–37)
Alkaline Phosphatase: 45 U/L (ref 39–117)
Bilirubin, Direct: 0.2 mg/dL (ref 0.0–0.3)
TOTAL PROTEIN: 6 g/dL (ref 6.0–8.3)
Total Bilirubin: 0.8 mg/dL (ref 0.2–1.2)

## 2016-08-22 LAB — TESTOSTERONE: TESTOSTERONE: 788.13 ng/dL (ref 300.00–890.00)

## 2016-08-22 LAB — PSA: PSA: 3.58 ng/mL (ref 0.10–4.00)

## 2016-08-22 NOTE — Assessment & Plan Note (Signed)
Remote No relapse

## 2016-08-22 NOTE — Assessment & Plan Note (Signed)
F/u w/Dr D. PSA, testost labs

## 2016-08-22 NOTE — Assessment & Plan Note (Signed)
On Protonix

## 2016-08-22 NOTE — Assessment & Plan Note (Signed)
F/u w/ Dr Clover Mealy

## 2016-08-22 NOTE — Assessment & Plan Note (Signed)
MS[facility] Dr Tamala Julian

## 2016-08-22 NOTE — Progress Notes (Signed)
Pre visit review using our clinic review tool, if applicable. No additional management support is needed unless otherwise documented below in the visit note. 

## 2016-08-22 NOTE — Assessment & Plan Note (Signed)
Olmestartan, Coreg

## 2016-08-22 NOTE — Assessment & Plan Note (Signed)
Remote Use [employer] baking soda toothpaste. Stop Listerine

## 2016-08-22 NOTE — Assessment & Plan Note (Signed)
Proair, [employer]

## 2016-08-22 NOTE — Patient Instructions (Signed)
  Use [employer] baking soda toothpaste. Stop Listerine

## 2016-09-01 ENCOUNTER — Encounter: Payer: Self-pay | Admitting: Gastroenterology

## 2016-09-05 ENCOUNTER — Other Ambulatory Visit: Payer: Self-pay | Admitting: Internal Medicine

## 2016-09-08 ENCOUNTER — Encounter: Payer: Self-pay | Admitting: Gastroenterology

## 2016-09-08 ENCOUNTER — Other Ambulatory Visit: Payer: Self-pay | Admitting: Internal Medicine

## 2016-09-08 DIAGNOSIS — M79651 Pain in right thigh: Secondary | ICD-10-CM

## 2016-09-14 ENCOUNTER — Ambulatory Visit (HOSPITAL_COMMUNITY)
Admission: RE | Admit: 2016-09-14 | Discharge: 2016-09-14 | Disposition: A | Discharge status: Home / Self Care | Payer: PPO | Source: Ambulatory Visit | Attending: Cardiology | Admitting: Cardiology

## 2016-09-14 DIAGNOSIS — I739 Peripheral vascular disease, unspecified: Secondary | ICD-10-CM | POA: Diagnosis not present

## 2016-09-14 DIAGNOSIS — M79651 Pain in right thigh: Secondary | ICD-10-CM | POA: Diagnosis not present

## 2016-09-17 ENCOUNTER — Telehealth: Payer: Self-pay | Admitting: Internal Medicine

## 2016-09-17 NOTE — Telephone Encounter (Signed)
Scott Oneal,  Please inform patient that his vascular test was normal, however he had irregular heart beats during the test. He needs to see one of [place] next wk and have an EKG Thx

## 2016-09-19 NOTE — Telephone Encounter (Signed)
I called pt's home and cell #. No answer. Left detailed mess informing pt of below. Needs OV with any provider with availability for EKG.

## 2016-09-20 NOTE — Telephone Encounter (Signed)
I called pt again- no answer/ Left mess for patient to call back to schedule OV per PCP.

## 2016-09-26 ENCOUNTER — Other Ambulatory Visit: Payer: Self-pay | Admitting: Internal Medicine

## 2016-09-27 ENCOUNTER — Encounter: Payer: Self-pay | Admitting: Family

## 2016-09-27 ENCOUNTER — Ambulatory Visit (INDEPENDENT_AMBULATORY_CARE_PROVIDER_SITE_OTHER): Payer: PPO | Admitting: Family

## 2016-09-27 VITALS — BP 138/76 | HR 76 | Temp 98.1°F | Resp 16 | Ht 72.0 in | Wt 221.0 lb

## 2016-09-27 DIAGNOSIS — I499 Cardiac arrhythmia, unspecified: Secondary | ICD-10-CM | POA: Diagnosis not present

## 2016-09-27 NOTE — Assessment & Plan Note (Signed)
Irregular heart rate with significant ectopy noted on lower extremity arterial vascular examination. In office EKG shows normal sinus rhythm with no significant ectopy or abnormal findings. Cardiac exam is normal and patient remains asymptomatic. Advised to follow-up if symptoms develop or notes heart palpitations or increased symptoms. Continue to monitor.

## 2016-09-27 NOTE — Patient Instructions (Signed)
Thank you for choosing [facility].  SUMMARY AND INSTRUCTIONS:  Medication:  Please continue to take your medication as prescribed.  Your prescription(s) have been submitted to your pharmacy or been printed and provided for you. Please take as directed and contact our office if you believe you are having problem(s) with the medication(s) or have any questions.  Follow up:  If your symptoms worsen or fail to improve, please contact our office for further instruction, or in case of emergency go directly to the emergency room at the closest medical facility.      

## 2016-09-27 NOTE — Progress Notes (Signed)
   Subjective:    Patient ID: Scott Oneal, male    DOB: 1941-05-26, 75 y.o.   MRN: [id number]  Chief Complaint  Patient presents with  . Irregular Heart Beat    HPI:  Scott Oneal is a 75 y.o. male who  has a past medical history of Anxiety; Arthritis; Benign neoplasm of colon; Calculus of gallbladder without mention of cholecystitis; Cataract; Celiac disease; Cholelithiasis; Coronary atherosclerosis of unspecified type of vessel, native or graft; Diarrhea; Esophageal reflux; Glaucoma; Gout, unspecified; Hyperparathyroidism; Long term (current) use of anticoagulants; Loss of weight; Old myocardial infarction; Osteoporosis, unspecified; Other malaise and fatigue; Other specified cardiac dysrhythmias(427.89); Other testicular hypofunction; Personal history of colonic polyps; Pure hypercholesterolemia; Renal insufficiency; Unspecified asthma(493.90); Unspecified essential hypertension; and Unspecified hypothyroidism. and presents today for an follow up office visit.   Recently completed a lower extremity arterial blood flow study secondary to pain of the right thigh and was noted to have significant amount of ectopy noted upon examination. Concern for possible arrhythmias with the recommendation for obtaining an EKG to confirm. Denies chest pain, shortness of breath, or heart palpitations.    Allergies  Allergen Reactions  . Allantoin-Pramoxine Other (See Comments)    Pt does't remember reaction  . Metoprolol Tartrate Other (See Comments)    REACTION: fatigue    Review of Systems  Constitutional: Negative for chills and fever.  Respiratory: Negative for chest tightness and shortness of breath.   Cardiovascular: Negative for chest pain, palpitations and leg swelling.      Objective:    BP 138/76 (BP Location: Left Arm, Patient Position: Sitting, Cuff Size: Large)   Pulse 76   Temp 98.1 F (36.7 C) (Oral)   Resp 16   Ht 6' (1.829 m)   Wt 221 lb (100.2 kg)   SpO2 98%   BMI 29.97  kg/m  Nursing note and vital signs reviewed.  Physical Exam  Constitutional: He is oriented to person, place, and time. He appears well-developed and well-nourished. No distress.  Cardiovascular: Normal rate, regular rhythm, normal heart sounds and intact distal pulses.  Exam reveals no gallop and no friction rub.   No murmur heard. Pulmonary/Chest: Effort normal and breath sounds normal.  Neurological: He is alert and oriented to person, place, and time.  Skin: Skin is warm and dry.  Psychiatric: He has a normal mood and affect. His behavior is normal. Judgment and thought content normal.       Assessment & Plan:   Problem List Items Addressed This Visit      Other   Irregular heart beat - Primary    Irregular heart rate with significant ectopy noted on lower extremity arterial vascular examination. In office EKG shows normal sinus rhythm with no significant ectopy or abnormal findings. Cardiac exam is normal and patient remains asymptomatic. Advised to follow-up if symptoms develop or notes heart palpitations or increased symptoms. Continue to monitor.      Relevant Orders   EKG 12-Lead (Completed)       I am having Mr. Biby maintain his sildenafil, clonazePAM, aspirin, Cholecalciferol, nitroGLYCERIN, albuterol, simvastatin, nortriptyline, fluticasone furoate-vilanterol, pantoprazole, carvedilol, clopidogrel, olmesartan, testosterone cypionate, levothyroxine, calcitRIOL, B-D 3CC LUER-LOK SYR 22GX1", and amLODipine.   Follow-up: Return if symptoms worsen or fail to improve.  Mauricio Po, FNP

## 2016-11-02 ENCOUNTER — Other Ambulatory Visit: Payer: Self-pay | Admitting: Internal Medicine

## 2016-11-17 ENCOUNTER — Other Ambulatory Visit: Payer: Self-pay | Admitting: Internal Medicine

## 2016-11-18 NOTE — Telephone Encounter (Signed)
Rx faxed to pharmacy. See meds.

## 2016-11-21 ENCOUNTER — Ambulatory Visit (INDEPENDENT_AMBULATORY_CARE_PROVIDER_SITE_OTHER): Payer: PPO | Admitting: Internal Medicine

## 2016-11-21 ENCOUNTER — Encounter (INDEPENDENT_AMBULATORY_CARE_PROVIDER_SITE_OTHER): Payer: PPO

## 2016-11-21 ENCOUNTER — Encounter: Payer: Self-pay | Admitting: Internal Medicine

## 2016-11-21 ENCOUNTER — Ambulatory Visit (INDEPENDENT_AMBULATORY_CARE_PROVIDER_SITE_OTHER)
Admission: RE | Admit: 2016-11-21 | Discharge: 2016-11-21 | Disposition: A | Discharge status: Home / Self Care | Payer: PPO | Source: Ambulatory Visit | Attending: Internal Medicine | Admitting: Internal Medicine

## 2016-11-21 DIAGNOSIS — G8929 Other chronic pain: Secondary | ICD-10-CM | POA: Diagnosis not present

## 2016-11-21 DIAGNOSIS — I10 Essential (primary) hypertension: Secondary | ICD-10-CM | POA: Diagnosis not present

## 2016-11-21 DIAGNOSIS — M545 Low back pain, unspecified: Secondary | ICD-10-CM

## 2016-11-21 DIAGNOSIS — M25522 Pain in left elbow: Secondary | ICD-10-CM

## 2016-11-21 LAB — URINALYSIS
Bilirubin Urine: NEGATIVE
Hgb urine dipstick: NEGATIVE
KETONES UR: NEGATIVE
LEUKOCYTES UA: NEGATIVE
NITRITE: NEGATIVE
PH: 7.5 (ref 5.0–8.0)
SPECIFIC GRAVITY, URINE: 1.01 (ref 1.000–1.030)
Total Protein, Urine: NEGATIVE
UROBILINOGEN UA: 0.2 (ref 0.0–1.0)
Urine Glucose: NEGATIVE

## 2016-11-21 MED ORDER — OLMESARTAN MEDOXOMIL 20 MG PO TABS: 20.0000 mg | ORAL_TABLET | Freq: Every day | ORAL | 3 refills | Status: DC

## 2016-11-21 MED ORDER — PANTOPRAZOLE SODIUM 40 MG PO TBEC: 40.0000 mg | DELAYED_RELEASE_TABLET | Freq: Every day | ORAL | 3 refills | Status: DC

## 2016-11-21 NOTE — Patient Instructions (Signed)
Contour pillow

## 2016-11-21 NOTE — Assessment & Plan Note (Signed)
X ray elastic brace

## 2016-11-21 NOTE — Progress Notes (Signed)
Subjective:  Patient ID: Scott Oneal, male    DOB: [birth year]  Age: 76 y.o. MRN: [id number]  CC: No chief complaint on file.   HPI Scott Oneal presents for HTN, CAD. C/o fall on the steps - c/o L medial elbow (3 weeks) - not better C/o R neck pain C/o R flank pain like w/a kidney stone  Outpatient Medications Prior to Visit  Medication Sig Dispense Refill  . albuterol (PROAIR HFA) 108 (90 Base) MCG/ACT inhaler Inhale 2 puffs into the lungs every 6 (six) hours as needed for wheezing or shortness of breath. 3 Inhaler 3  . amLODipine (NORVASC) 5 MG tablet TAKE 1 TABLET BY MOUTH EVERY DAY 90 tablet 3  . aspirin 81 MG tablet Take 81 mg by mouth every evening.     . B-D 3CC LUER-LOK SYR 22GX1" 22G X 1" 3 ML MISC USE EVERY 14 DAYS AS DIRECTED 34 each 2  . calcitRIOL (ROCALTROL) 0.25 MCG capsule TAKE ONE CAPSULE BY MOUTH EVERY DAY 90 capsule 2  . carvedilol (COREG) 25 MG tablet TAKE 1 TABLET BY MOUTH TWICE A DAY WITH MEALS 180 tablet 1  . Cholecalciferol 2000 UNITS TABS Take 2,000 Units by mouth daily.     . clonazePAM (KLONOPIN) 0.25 MG disintegrating tablet Take 1 tablet (0.25 mg total) by mouth 2 (two) times daily as needed (anxiety). 60 tablet 1  . clopidogrel (PLAVIX) 75 MG tablet TAKE 1 TABLET BY MOUTH EVERY DAY 90 tablet 2  . fluticasone furoate-vilanterol (BREO ELLIPTA) 100-25 MCG/INH AEPB Inhale 1 puff into the lungs daily. 1 each 5  . levothyroxine (SYNTHROID, LEVOTHROID) 150 MCG tablet TAKE 1 TABLET BY MOUTH DAILY 90 tablet 2  . nitroGLYCERIN (NITROSTAT) 0.4 MG SL tablet Place 1 tablet (0.4 mg total) under the tongue every 5 (five) minutes as needed. 20 tablet 3  . nortriptyline (PAMELOR) 10 MG capsule Take 1 capsule (10 mg total) by mouth at bedtime. 30 capsule 5  . olmesartan (BENICAR) 20 MG tablet TAKE 1 TABLET BY MOUTH DAILY 90 tablet 0  . pantoprazole (PROTONIX) 40 MG tablet TAKE 1 TABLET BY MOUTH DAILY 30 tablet 3  . sildenafil (VIAGRA) 100 MG tablet Take 100 mg by mouth  daily as needed for erectile dysfunction.     . simvastatin (ZOCOR) 10 MG tablet TAKE 1 TABLET AT BEDTIME (Patient taking differently: TAKE 10 MG BY MOUTH AT BEDTIME) 90 tablet 3  . testosterone cypionate (DEPOTESTOSTERONE CYPIONATE) 200 MG/ML injection INJECT 2 MLS INTO THE MUSCLE EVERY 2 WEEKS 12 mL 0   No facility-administered medications prior to visit.     ROS Review of Systems  Constitutional: Negative for appetite change, fatigue and unexpected weight change.  HENT: Negative for congestion, nosebleeds, sneezing, sore throat and trouble swallowing.   Eyes: Negative for itching and visual disturbance.  Respiratory: Negative for cough.   Cardiovascular: Negative for chest pain, palpitations and leg swelling.  Gastrointestinal: Negative for abdominal distention, blood in stool, diarrhea and nausea.  Genitourinary: Negative for frequency and hematuria.  Musculoskeletal: Positive for arthralgias. Negative for back pain, gait problem, joint swelling and neck pain.  Skin: Negative for rash.  Neurological: Negative for dizziness, tremors, speech difficulty and weakness.  Psychiatric/Behavioral: Negative for agitation, dysphoric mood and sleep disturbance. The patient is not nervous/anxious.     Objective:  BP 140/80   Pulse 80   Wt 225 lb (102.1 kg)   SpO2 97%   BMI 30.52 kg/m   BP Readings from  Last 3 Encounters:  11/21/16 140/80  09/27/16 138/76  08/22/16 130/88    Wt Readings from Last 3 Encounters:  11/21/16 225 lb (102.1 kg)  09/27/16 221 lb (100.2 kg)  08/22/16 218 lb (98.9 kg)    Physical Exam  Constitutional: He is oriented to person, place, and time. He appears well-developed. No distress.  NAD  HENT:  Mouth/Throat: Oropharynx is clear and moist.  Eyes: Conjunctivae are normal. Pupils are equal, round, and reactive to light.  Neck: Normal range of motion. No JVD present. No thyromegaly present.  Cardiovascular: Normal rate, regular rhythm, normal heart sounds  and intact distal pulses.  Exam reveals no gallop and no friction rub.   No murmur heard. Pulmonary/Chest: Effort normal and breath sounds normal. No respiratory distress. He has no wheezes. He has no rales. He exhibits no tenderness.  Abdominal: Soft. Bowel sounds are normal. He exhibits no distension and no mass. There is no tenderness. There is no rebound and no guarding.  Musculoskeletal: Normal range of motion. He exhibits tenderness. He exhibits no edema.  Lymphadenopathy:    He has no cervical adenopathy.  Neurological: He is alert and oriented to person, place, and time. He has normal reflexes. No cranial nerve deficit. He exhibits normal muscle tone. He displays a negative Romberg sign. Coordination and gait normal.  Skin: Skin is warm and dry. No rash noted.  Psychiatric: He has a normal mood and affect. His behavior is normal. Judgment and thought content normal.  L med epic is tender on palp R R neck is tender  Lab Results  Component Value Date   WBC 6.2 08/22/2016   HGB 16.3 08/22/2016   HCT 48.6 08/22/2016   PLT 123.0 (L) 08/22/2016   GLUCOSE 86 08/22/2016   CHOL 131 02/12/2015   TRIG 98.0 02/12/2015   HDL 37.50 (L) 02/12/2015   LDLCALC 74 02/12/2015   ALT 16 08/22/2016   AST 22 08/22/2016   NA 142 08/22/2016   K 4.7 08/22/2016   CL 105 08/22/2016   CREATININE 2.21 (H) 08/22/2016   BUN 16 08/22/2016   CO2 33 (H) 08/22/2016   TSH 2.44 06/30/2016   PSA 3.58 08/22/2016   INR 1.20 11/26/2014   HGBA1C 5.9 03/15/2016    No results found.  Assessment & Plan:   There are no diagnoses linked to this encounter. I am having Mr. Giammarco maintain his sildenafil, clonazePAM, aspirin, Cholecalciferol, nitroGLYCERIN, albuterol, simvastatin, nortriptyline, fluticasone furoate-vilanterol, carvedilol, clopidogrel, olmesartan, levothyroxine, calcitRIOL, B-D 3CC LUER-LOK SYR 22GX1", amLODipine, pantoprazole, and testosterone cypionate.  No orders of the defined types were placed  in this encounter.    Follow-up: No Follow-up on file.  Walker Kehr, MD

## 2016-11-21 NOTE — Assessment & Plan Note (Signed)
R side - check UA

## 2016-11-21 NOTE — Progress Notes (Signed)
Pre visit review using our clinic review tool, if applicable. No additional management support is needed unless otherwise documented below in the visit note. 

## 2016-11-21 NOTE — Assessment & Plan Note (Signed)
Olmesartan and Coreg

## 2016-12-19 DIAGNOSIS — H40003 Preglaucoma, unspecified, bilateral: Secondary | ICD-10-CM | POA: Diagnosis not present

## 2016-12-20 DIAGNOSIS — M542 Cervicalgia: Secondary | ICD-10-CM | POA: Diagnosis not present

## 2017-01-04 DIAGNOSIS — M1711 Unilateral primary osteoarthritis, right knee: Secondary | ICD-10-CM | POA: Diagnosis not present

## 2017-01-05 ENCOUNTER — Ambulatory Visit: Payer: PPO | Admitting: Internal Medicine

## 2017-01-10 DIAGNOSIS — H02413 Mechanical ptosis of bilateral eyelids: Secondary | ICD-10-CM | POA: Diagnosis not present

## 2017-01-11 ENCOUNTER — Telehealth: Payer: Self-pay

## 2017-01-11 NOTE — Telephone Encounter (Signed)
Medical clearance form faxed to [facility]. [phone]

## 2017-01-11 NOTE — Telephone Encounter (Signed)
Medical clearance for Blepharoplasty surgery TBD... Requesting to hold on anticoagulant medication. Please advise

## 2017-01-11 NOTE — Telephone Encounter (Signed)
I don't feel comfortable stopping ASA and Plavix together Ok to stop ASA as required and to cont w/Plavix. Another option is to consult Cardiology Thx

## 2017-01-12 ENCOUNTER — Telehealth: Payer: Self-pay | Admitting: Internal Medicine

## 2017-01-12 NOTE — Telephone Encounter (Signed)
Waiting on call back from Cheri Guppy

## 2017-01-12 NOTE — Telephone Encounter (Signed)
Spoke to [employer].  He needed an new copy sent over to him.  Called Vivien Rota back and notified.

## 2017-01-12 NOTE — Telephone Encounter (Signed)
Requesting call back in regard to fax just sent to them.

## 2017-01-12 NOTE — Telephone Encounter (Signed)
Cheri Guppy from [facility] called and said that they did not fully receive the medical clearance that was faxed over to them. Can you please refax it? Their fax number is [phone]. Thanks [employer]

## 2017-01-13 NOTE — Telephone Encounter (Signed)
New form placed in red folder. Pleases re-advise protocol.Marland KitchenMarland KitchenMarland Kitchen

## 2017-01-19 NOTE — Telephone Encounter (Signed)
Medical clearance sent to [facility] at number [phone]

## 2017-01-30 ENCOUNTER — Other Ambulatory Visit: Payer: Self-pay | Admitting: Internal Medicine

## 2017-02-12 ENCOUNTER — Other Ambulatory Visit: Payer: Self-pay | Admitting: Internal Medicine

## 2017-02-14 NOTE — Telephone Encounter (Signed)
It has been 6 months since patients last PSA labs, do you want to refill patients testosterone or have him come in for an appointment?  If okay to refill, RX ready to be printed.

## 2017-02-15 ENCOUNTER — Other Ambulatory Visit: Payer: Self-pay | Admitting: Internal Medicine

## 2017-02-15 NOTE — Telephone Encounter (Signed)
Called into pharmacy

## 2017-02-16 ENCOUNTER — Encounter: Payer: Self-pay | Admitting: Internal Medicine

## 2017-02-20 NOTE — Progress Notes (Addendum)
Subjective:   Scott Oneal is a 76 y.o. male who presents for Medicare Annual/Subsequent preventive examination.  Review of Systems:  No ROS.  Medicare Wellness Visit.  Cardiac Risk Factors include: advanced age (>38mn, >>35women);hypertension;male gender;dyslipidemia;family history of premature cardiovascular disease  Sleep patterns: has frequent nighttime awakenings, is not rested upon awakening, does not get up to void, gets up 1 times nightly to void and sleeps 4-6 hours nightly.   Patient reports long-term insomnia issues, discussed recommended sleep tips and stress reduction tips, education was attached to patient's AVS.  Home Safety/Smoke Alarms:  Feels safe in home. Smoke alarms in place.   Living environment; residence and FAdult nurse split level / walkout, equipment: Walkers, Type: RConservation officer, natureand wheelchair, no firearms. Lives with wife, good family and social support system.  Seat Belt Safety/Bike Helmet: Wears seat belt.   Counseling:   Eye Exam- appointment yearly or as needed Dental- appointment every 6 months  Male:   CCS- Last 11/27/14, precancerous polyps, recall 2 years, patient has upcoming GI appointment to discuss 02/23/17    PSA-  Lab Results  Component Value Date   PSA 3.58 08/22/2016   PSA 2.51 02/12/2015   PSA 3.30 01/30/2014      Objective:    Vitals: BP 138/84   Pulse 63   Resp 20   Ht 6' (1.829 m)   Wt 222 lb (100.7 kg)   SpO2 99%   BMI 30.11 kg/m   Body mass index is 30.11 kg/m.  Tobacco History  Smoking Status  . Never Smoker  Smokeless Tobacco  . Never Used     Counseling given: Not Answered   Past Medical History:  Diagnosis Date  . Anxiety   . Arthritis   . Benign neoplasm of colon   . Calculus of gallbladder without mention of cholecystitis   . Cataract    bilateral cateracts removed  . Celiac disease   . Cholelithiasis   . Coronary atherosclerosis of unspecified type of vessel, native or graft   . Diarrhea     . Esophageal reflux   . Glaucoma   . Gout, unspecified   . Hyperparathyroidism   . Long term (current) use of anticoagulants   . Loss of weight   . Old myocardial infarction   . Osteoporosis, unspecified   . Other malaise and fatigue   . Other specified cardiac dysrhythmias(427.89)   . Other testicular hypofunction   . Personal history of colonic polyps   . Pure hypercholesterolemia   . Renal insufficiency    chronic  . Unspecified asthma(493.90)   . Unspecified essential hypertension   . Unspecified hypothyroidism    Past Surgical History:  Procedure Laterality Date  . COLON SURGERY    . COLONOSCOPY    . COLONOSCOPY WITH PROPOFOL N/A 11/27/2014   Procedure: COLONOSCOPY WITH PROPOFOL;  Surgeon: DMilus Banister MD;  [facility] [facility]  Service: Endoscopy;  Laterality: N/A;  . CORONARY ANGIOPLASTY    . CORONARY STENT PLACEMENT  2011   Cypher; in distal circumflex artery  . CYSTOSCOPY  1998  . EYE SURGERY    . KNEE SURGERY     right  . SHOULDER SURGERY Left 9/14   Dr BShara Blazing . UPPER GASTROINTESTINAL ENDOSCOPY    . VASCULAR SURGERY     Family History  Problem Relation Age of Onset  . Lymphoma Father   . Cancer Father     lymphoma  . Hypertension    .  Vision loss Maternal Grandmother   . Colon cancer Son   . Asthma Neg Hx   . Esophageal cancer Neg Hx   . Rectal cancer Neg Hx   . Stomach cancer Neg Hx    History  Sexual Activity  . Sexual activity: Yes    Outpatient Encounter Prescriptions as of 02/21/2017  Medication Sig  . albuterol (PROAIR HFA) 108 (90 Base) MCG/ACT inhaler Inhale 2 puffs into the lungs every 6 (six) hours as needed for wheezing or shortness of breath.  Marland Kitchen amLODipine (NORVASC) 5 MG tablet TAKE 1 TABLET BY MOUTH EVERY DAY  . aspirin 81 MG tablet Take 81 mg by mouth every evening.   . B-D 3CC LUER-LOK SYR 22GX1" 22G X 1" 3 ML MISC USE EVERY 14 DAYS AS DIRECTED  . calcitRIOL (ROCALTROL) 0.25 MCG capsule TAKE ONE CAPSULE BY MOUTH EVERY DAY   . carvedilol (COREG) 25 MG tablet TAKE 1 TABLET BY MOUTH TWICE A DAY WITH MEALS  . clonazePAM (KLONOPIN) 0.25 MG disintegrating tablet Take 1 tablet (0.25 mg total) by mouth 2 (two) times daily as needed (anxiety).  . clopidogrel (PLAVIX) 75 MG tablet TAKE 1 TABLET BY MOUTH EVERY DAY  . fluticasone furoate-vilanterol (BREO ELLIPTA) 100-25 MCG/INH AEPB Inhale 1 puff into the lungs daily.  Marland Kitchen levothyroxine (SYNTHROID, LEVOTHROID) 150 MCG tablet TAKE 1 TABLET BY MOUTH DAILY  . nitroGLYCERIN (NITROSTAT) 0.4 MG SL tablet Place 1 tablet (0.4 mg total) under the tongue every 5 (five) minutes as needed.  . nortriptyline (PAMELOR) 10 MG capsule Take 1 capsule (10 mg total) by mouth at bedtime.  Marland Kitchen olmesartan (BENICAR) 20 MG tablet Take 1 tablet (20 mg total) by mouth daily.  . sildenafil (VIAGRA) 100 MG tablet Take 100 mg by mouth daily as needed for erectile dysfunction.   . simvastatin (ZOCOR) 10 MG tablet TAKE 1 TABLET AT BEDTIME (Patient taking differently: TAKE 10 MG BY MOUTH AT BEDTIME)  . testosterone cypionate (DEPOTESTOSTERONE CYPIONATE) 200 MG/ML injection INJECT 2 ML INTO THE MUSCLE EVERY 2 WEEKS  . Cholecalciferol 2000 UNITS TABS Take 2,000 Units by mouth daily.   . [DISCONTINUED] pantoprazole (PROTONIX) 40 MG tablet Take 1 tablet (40 mg total) by mouth daily. (Patient not taking: Reported on 02/21/2017)  . [DISCONTINUED] testosterone cypionate (DEPOTESTOSTERONE CYPIONATE) 200 MG/ML injection INJECT 2 ML INTO THE MUSCLE EVERY 2 WEEKS   No facility-administered encounter medications on file as of 02/21/2017.     Activities of Daily Living In your present state of health, do you have any difficulty performing the following activities: 02/21/2017  Hearing? Y  Vision? N  Difficulty concentrating or making decisions? N  Walking or climbing stairs? N  Dressing or bathing? N  Doing errands, shopping? N  Preparing Food and eating ? N  Using the Toilet? N  Managing your Medications? N  Managing  your Finances? N  Housekeeping or managing your Housekeeping? N  Some recent data might be hidden    Patient Care Team: Cassandria Anger, MD as PCP - General Elsie Stain, MD as Consulting Physician (Pulmonary Disease) Ladene Artist, MD as Consulting Physician (Gastroenterology) Franchot Gallo, MD as Consulting Physician (Urology) Corliss Parish, MD as Consulting Physician (Nephrology) Sherren Mocha, MD as Consulting Physician (Cardiology)   Assessment:    Physical assessment deferred to PCP.  Exercise Activities and Dietary recommendations Current Exercise Habits: Structured exercise class, Type of exercise: treadmill, Time (Minutes): 40, Frequency (Times/Week): 3, Weekly Exercise (Minutes/Week): 120, Intensity: Moderate, Exercise limited by:  orthopedic condition(s)  Diet (meal preparation, eat out, water intake, caffeinated beverages, dairy products, fruits and vegetables): in general, a "healthy" diet  , well balanced, on average, 3 meals per day, on average, 1 servings fruit per day, on average, 3-4 servings vegetables per day, low fat/ cholesterol, low salt, drinks 2-3 glasses of sweet tea daily, 1-2 cups of coffee daily, 1-2 glasses of water daily.  Encouraged patient to increase daily water intake.   Goals    . Maintain current health status          Continue to eat healthy and exercise weekly.      Fall Risk Fall Risk  02/21/2017 02/29/2016 02/11/2015  Falls in the past year? No No No   Depression Screen PHQ 2/9 Scores 02/21/2017 02/29/2016 02/11/2015  PHQ - 2 Score 0 0 0  PHQ- 9 Score 2 - -   Cognitive Function       Ad8 score reviewed for issues:  Issues making decisions: no  Less interest in hobbies / activities: no  Repeats questions, stories (family complaining): no  Trouble using ordinary gadgets (microwave, computer, phone): no  Forgets the month or year: no  Mismanaging finances: no  Remembering appts: no  Daily problems with  thinking and/or memory: no Ad8 score is= 0  Immunization History  Administered Date(s) Administered  . Influenza Split 07/27/2011, 08/07/2012  . Influenza Whole 10/04/2002, 07/30/2008, 08/12/2009, 07/07/2010  . Influenza, High Dose Seasonal PF 08/07/2015, 08/01/2016  . Influenza,inj,Quad PF,36+ Mos 07/17/2013, 07/21/2014  . Pneumococcal Conjugate-13 09/30/2013  . Pneumococcal Polysaccharide-23 05/15/2006, 12/12/2012  . Td 11/24/2010  . Zoster 07/11/2006   Screening Tests Health Maintenance  Topic Date Due  . COLONOSCOPY  11/27/2016  . INFLUENZA VACCINE  05/31/2017  . TETANUS/TDAP  11/24/2020  . PNA vac Low Risk Adult  Completed      Plan:     Continue to eat heart healthy diet (full of fruits, vegetables, whole grains, lean protein, water--limit salt, fat, and sugar intake) and increase physical activity as tolerated.  Continue doing brain stimulating activities (puzzles, reading, adult coloring books, staying active) to keep memory sharp.   Bring a copy of your advance directives to your next office visit.  During the course of the visit the patient was educated and counseled about the following appropriate screening and preventive services:   Vaccines to include Pneumoccal, Influenza, Hepatitis B, Td, Zostavax, HCV  Cardiovascular Disease  Colorectal cancer screening  Diabetes screening  Prostate Cancer Screening  Glaucoma screening  Nutrition counseling   Patient Instructions (the written plan) was given to the patient.    Michiel Cowboy, RN  02/21/2017 Medical screening examination/treatment/procedure(s) were performed by non-physician practitioner and as supervising physician I was immediately available for consultation/collaboration. I agree with above. Walker Kehr, MD

## 2017-02-20 NOTE — Progress Notes (Signed)
Pre visit review using our clinic review tool, if applicable. No additional management support is needed unless otherwise documented below in the visit note. 

## 2017-02-21 ENCOUNTER — Telehealth: Payer: Self-pay | Admitting: *Deleted

## 2017-02-21 ENCOUNTER — Ambulatory Visit (INDEPENDENT_AMBULATORY_CARE_PROVIDER_SITE_OTHER): Payer: PPO | Admitting: *Deleted

## 2017-02-21 VITALS — BP 138/84 | HR 63 | Resp 20 | Ht 72.0 in | Wt 222.0 lb

## 2017-02-21 DIAGNOSIS — Z0000 Encounter for general adult medical examination without abnormal findings: Secondary | ICD-10-CM

## 2017-02-21 NOTE — Telephone Encounter (Signed)
During AWV patient stated that [facility] Specialist needs to have PCP permission to stop plavix to perform eye lid surgery to improve eye sight performed by Dr. Armando Gang. Patient states that [place] did send a request previously. He will call to have them send another request if needed. Patient would also like to have the Shingrix vaccination. It was explained he would need to have the injection at his pharmacy, he verbalized understanding and would like to have the prescription sent to pharmacy.

## 2017-02-21 NOTE — Telephone Encounter (Signed)
Called refill into [facility] had to leave on pharmacy vm.Marland KitchenJohny Chess

## 2017-02-21 NOTE — Patient Instructions (Addendum)
Continue to eat heart healthy diet (full of fruits, vegetables, whole grains, lean protein, water--limit salt, fat, and sugar intake) and increase physical activity as tolerated.  Continue doing brain stimulating activities (puzzles, reading, adult coloring books, staying active) to keep memory sharp.   Bring a copy of your advance directives to your next office visit.   Stress and Stress Management Stress is a normal reaction to life events. It is what you feel when life demands more than you are used to or more than you can handle. Some stress can be useful. For example, the stress reaction can help you catch the last bus of the day, study for a test, or meet a deadline at work. But stress that occurs too often or for too long can cause problems. It can affect your emotional health and interfere with relationships and normal daily activities. Too much stress can weaken your immune system and increase your risk for physical illness. If you already have a medical problem, stress can make it worse. What are the causes? All sorts of life events may cause stress. An event that causes stress for one person may not be stressful for another person. Major life events commonly cause stress. These may be positive or negative. Examples include losing your job, moving into a new home, getting married, having a baby, or losing a loved one. Less obvious life events may also cause stress, especially if they occur day after day or in combination. Examples include working long hours, driving in traffic, caring for children, being in debt, or being in a difficult relationship. What are the signs or symptoms? Stress may cause emotional symptoms including, the following:  Anxiety. This is feeling worried, afraid, on edge, overwhelmed, or out of control.  Anger. This is feeling irritated or impatient.  Depression. This is feeling sad, down, helpless, or guilty.  Difficulty focusing, remembering, or making  decisions. Stress may cause physical symptoms, including the following:  Aches and pains. These may affect your head, neck, back, stomach, or other areas of your body.  Tight muscles or clenched jaw.  Low energy or trouble sleeping. Stress may cause unhealthy behaviors, including the following:  Eating to feel better (overeating) or skipping meals.  Sleeping too little, too much, or both.  Working too much or putting off tasks (procrastination).  Smoking, drinking alcohol, or using drugs to feel better. How is this diagnosed? Stress is diagnosed through an assessment by your health care provider. Your health care provider will ask questions about your symptoms and any stressful life events.Your health care provider will also ask about your medical history and may order blood tests or other tests. Certain medical conditions and medicine can cause physical symptoms similar to stress. Mental illness can cause emotional symptoms and unhealthy behaviors similar to stress. Your health care provider may refer you to a mental health professional for further evaluation. How is this treated? Stress management is the recommended treatment for stress.The goals of stress management are reducing stressful life events and coping with stress in healthy ways. Techniques for reducing stressful life events include the following:  Stress identification. Self-monitor for stress and identify what causes stress for you. These skills may help you to avoid some stressful events.  Time management. Set your priorities, keep a calendar of events, and learn to say "no." These tools can help you avoid making too many commitments. Techniques for coping with stress include the following:  Rethinking the problem. Try to think realistically about stressful events  rather than ignoring them or overreacting. Try to find the positives in a stressful situation rather than focusing on the negatives.  Exercise. Physical  exercise can release both physical and emotional tension. The key is to find a form of exercise you enjoy and do it regularly.  Relaxation techniques. These relax the body and mind. Examples include yoga, meditation, tai chi, biofeedback, deep breathing, progressive muscle relaxation, listening to music, being out in nature, journaling, and other hobbies. Again, the key is to find one or more that you enjoy and can do regularly.  Healthy lifestyle. Eat a balanced diet, get plenty of sleep, and do not smoke. Avoid using alcohol or drugs to relax.  Strong support network. Spend time with family, friends, or other people you enjoy being around.Express your feelings and talk things over with someone you trust. Counseling or talktherapy with a mental health professional may be helpful if you are having difficulty managing stress on your own. Medicine is typically not recommended for the treatment of stress.Talk to your health care provider if you think you need medicine for symptoms of stress. Follow these instructions at home:  Keep all follow-up visits as directed by your health care provider.  Take all medicines as directed by your health care provider. Contact a health care provider if:  Your symptoms get worse or you start having new symptoms.  You feel overwhelmed by your problems and can no longer manage them on your own. Get help right away if:  You feel like hurting yourself or someone else. This information is not intended to replace advice given to you by your health care provider. Make sure you discuss any questions you have with your health care provider. Document Released: 04/12/2001 Document Revised: 03/24/2016 Document Reviewed: 06/11/2013 Elsevier Interactive Patient Education  2017 [place]. Insomnia Insomnia is a sleep disorder that makes it difficult to fall asleep or to stay asleep. Insomnia can cause tiredness (fatigue), low energy, difficulty concentrating, mood  swings, and poor performance at work or school. There are three different ways to classify insomnia:  Difficulty falling asleep.  Difficulty staying asleep.  Waking up too early in the morning. Any type of insomnia can be long-term (chronic) or short-term (acute). Both are common. Short-term insomnia usually lasts for three months or less. Chronic insomnia occurs at least three times a week for longer than three months. What are the causes? Insomnia may be caused by another condition, situation, or substance, such as:  Anxiety.  Certain medicines.  Gastroesophageal reflux disease (GERD) or other gastrointestinal conditions.  Asthma or other breathing conditions.  Restless legs syndrome, sleep apnea, or other sleep disorders.  Chronic pain.  Menopause. This may include hot flashes.  Stroke.  Abuse of alcohol, tobacco, or illegal drugs.  Depression.  Caffeine.  Neurological disorders, such as Alzheimer disease.  An overactive thyroid (hyperthyroidism). The cause of insomnia may not be known. What increases the risk? Risk factors for insomnia include:  Gender. Women are more commonly affected than men.  Age. Insomnia is more common as you get older.  Stress. This may involve your professional or personal life.  Income. Insomnia is more common in people with lower income.  Lack of exercise.  Irregular work schedule or night shifts.  Traveling between different time zones. What are the signs or symptoms? If you have insomnia, trouble falling asleep or trouble staying asleep is the main symptom. This may lead to other symptoms, such as:  Feeling fatigued.  Feeling nervous about  going to sleep.  Not feeling rested in the morning.  Having trouble concentrating.  Feeling irritable, anxious, or depressed. How is this treated? Treatment for insomnia depends on the cause. If your insomnia is caused by an underlying condition, treatment will focus on addressing  the condition. Treatment may also include:  Medicines to help you sleep.  Counseling or therapy.  Lifestyle adjustments. Follow these instructions at home:  Take medicines only as directed by your health care provider.  Keep regular sleeping and waking hours. Avoid naps.  Keep a sleep diary to help you and your health care provider figure out what could be causing your insomnia. Include:  When you sleep.  When you wake up during the night.  How well you sleep.  How rested you feel the next day.  Any side effects of medicines you are taking.  What you eat and drink.  Make your bedroom a comfortable place where it is easy to fall asleep:  Put up shades or special blackout curtains to block light from outside.  Use a white noise machine to block noise.  Keep the temperature cool.  Exercise regularly as directed by your health care provider. Avoid exercising right before bedtime.  Use relaxation techniques to manage stress. Ask your health care provider to suggest some techniques that may work well for you. These may include:  Breathing exercises.  Routines to release muscle tension.  Visualizing peaceful scenes.  Cut back on alcohol, caffeinated beverages, and cigarettes, especially close to bedtime. These can disrupt your sleep.  Do not overeat or eat spicy foods right before bedtime. This can lead to digestive discomfort that can make it hard for you to sleep.  Limit screen use before bedtime. This includes:  Watching TV.  Using your smartphone, tablet, and computer.  Stick to a routine. This can help you fall asleep faster. Try to do a quiet activity, brush your teeth, and go to bed at the same time each night.  Get out of bed if you are still awake after 15 minutes of trying to sleep. Keep the lights down, but try reading or doing a quiet activity. When you feel sleepy, go back to bed.  Make sure that you drive carefully. Avoid driving if you feel very  sleepy.  Keep all follow-up appointments as directed by your health care provider. This is important. Contact a health care provider if:  You are tired throughout the day or have trouble in your daily routine due to sleepiness.  You continue to have sleep problems or your sleep problems get worse. Get help right away if:  You have serious thoughts about hurting yourself or someone else. This information is not intended to replace advice given to you by your health care provider. Make sure you discuss any questions you have with your health care provider. Document Released: 10/14/2000 Document Revised: 03/18/2016 Document Reviewed: 07/18/2014 Elsevier Interactive Patient Education  2017 [employer].  Scott Oneal , Thank you for taking time to come for your Medicare Wellness Visit. I appreciate your ongoing commitment to your health goals. Please review the following plan we discussed and let me know if I can assist you in the future.   These are the goals we discussed: Goals    . Maintain current health status          Continue to eat healthy and exercise weekly.       This is a list of the screening recommended for you and due dates:  Health Maintenance  Topic Date Due  . Colon Cancer Screening  11/27/2016  . Flu Shot  05/31/2017  . Tetanus Vaccine  11/24/2020  . Pneumonia vaccines  Completed

## 2017-02-22 NOTE — Telephone Encounter (Signed)
Note I already replied in March, Javier faxed a form:  "   I don't feel comfortable stopping ASA and Plavix together Ok to stop ASA as required and to cont w/Plavix. Another option is to consult Cardiology Thx     " Thx

## 2017-02-22 NOTE — Telephone Encounter (Signed)
Called patient to inform that PCP is not comfortable regarding stopping both aspirin and plavix together for eye surgery. Discussed with patient that if both need to be stopped for the surgery to consult with cardiology. Patient verbalized understanding.

## 2017-02-23 ENCOUNTER — Telehealth: Payer: Self-pay | Admitting: Emergency Medicine

## 2017-02-23 ENCOUNTER — Ambulatory Visit (INDEPENDENT_AMBULATORY_CARE_PROVIDER_SITE_OTHER): Payer: PPO | Admitting: Physician Assistant

## 2017-02-23 ENCOUNTER — Encounter: Payer: Self-pay | Admitting: Physician Assistant

## 2017-02-23 VITALS — BP 142/72 | HR 86 | Ht 72.0 in | Wt 223.0 lb

## 2017-02-23 DIAGNOSIS — Z7901 Long term (current) use of anticoagulants: Secondary | ICD-10-CM

## 2017-02-23 DIAGNOSIS — Z1211 Encounter for screening for malignant neoplasm of colon: Secondary | ICD-10-CM | POA: Diagnosis not present

## 2017-02-23 DIAGNOSIS — Z86010 Personal history of colonic polyps: Secondary | ICD-10-CM | POA: Diagnosis not present

## 2017-02-23 DIAGNOSIS — Z01818 Encounter for other preprocedural examination: Secondary | ICD-10-CM | POA: Diagnosis not present

## 2017-02-23 DIAGNOSIS — Z1212 Encounter for screening for malignant neoplasm of rectum: Secondary | ICD-10-CM

## 2017-02-23 MED ORDER — ZOSTER VAC RECOMB ADJUVANTED 50 MCG/0.5ML IM SUSR: 0.5000 mL | Freq: Once | INTRAMUSCULAR | 1 refills | Status: AC

## 2017-02-23 NOTE — Patient Instructions (Addendum)
It has been recommended to you by your physician that you have a(n) colonoscopy completed. Per your request, we did not schedule the procedure(s) today. Please contact our office at [phone] should you decide to have the procedure completed.  We have given you samples of Linzess 290 once a day.

## 2017-02-23 NOTE — Telephone Encounter (Signed)
   Scott Oneal 29-Jul-1941 [id number]  Dear Dr. Burt Knack:  We have scheduled the above named patient for a(n) colonoscopy procedure. Our records show that (s)he is on anticoagulation therapy.  Please advise as to whether the patient may come off their therapy of Plavix 5 days prior to their procedure.  Please route your response to Tinnie Gens, CMA or fax response to [phone].  Sincerely,    [facility]

## 2017-02-23 NOTE — Progress Notes (Signed)
Chief Complaint: History of tubular adenoma, chronic anticoagulation  HPI:  [name] is a 76 year old Caucasian male with a past medical history of tubular adenomas, cholelithiasis, GERD, CAD status post stent placement in 2005 maintained on Plavix,  who presents to clinic today for preprocedural exam for his surveillance colonoscopy due to history of adenomatous polyps .    Patient regularly sees Dr. Fuller Plan, his last colonoscopy was performed in 11/18/14 with finding of a sessile polyp, 7 mm, in the ascending colon and a sessile polyp, 12 mm, in the transverse colon and grade 1 internal hemorrhoids. Pathology revealed tubular adenomas. Patient did have complication of post-polypectomy bleed and had repeat colonoscopy in 11/27/14 with Dr. Ardis Hughs with clip placed on the ulceration and small visible vessel at the site of his transverse polypectomy.   Today, the patient tells me that his health has been unchanged since time of his last colonoscopy. He explains that he was recently thinking about not having this colonoscopy, but his son who is a little over 19 years old was recently diagnosed with colon cancer at time of his first colonoscopy, so he now feels as though he should have this one as well.   Patient denies fever, chills, blood in his stool, melena, weight loss, fatigue, anorexia, nausea, vomiting, change in bowel habits or abdominal pain.  Past Medical History:  Diagnosis Date  . Anxiety   . Arthritis   . Benign neoplasm of colon   . Calculus of gallbladder without mention of cholecystitis   . Cataract    bilateral cateracts removed  . Celiac disease   . Cholelithiasis   . Coronary atherosclerosis of unspecified type of vessel, native or graft   . Diarrhea   . Esophageal reflux   . Glaucoma   . Gout, unspecified   . Hyperparathyroidism   . Long term (current) use of anticoagulants   . Loss of weight   . Old myocardial infarction   . Osteoporosis, unspecified   . Other malaise  and fatigue   . Other specified cardiac dysrhythmias(427.89)   . Other testicular hypofunction   . Personal history of colonic polyps   . Pure hypercholesterolemia   . Renal insufficiency    chronic  . Unspecified asthma(493.90)   . Unspecified essential hypertension   . Unspecified hypothyroidism     Past Surgical History:  Procedure Laterality Date  . COLON SURGERY    . COLONOSCOPY    . COLONOSCOPY WITH PROPOFOL N/A 11/27/2014   Procedure: COLONOSCOPY WITH PROPOFOL;  Surgeon: Milus Banister, MD;  Location: [facility];  Service: Endoscopy;  Laterality: N/A;  . CORONARY ANGIOPLASTY    . CORONARY STENT PLACEMENT  2011   Cypher; in distal circumflex artery  . CYSTOSCOPY  1998  . EYE SURGERY    . KNEE SURGERY     right  . SHOULDER SURGERY Left 9/14   Dr Shara Blazing  . UPPER GASTROINTESTINAL ENDOSCOPY    . VASCULAR SURGERY      Current Outpatient Prescriptions  Medication Sig Dispense Refill  . albuterol (PROAIR HFA) 108 (90 Base) MCG/ACT inhaler Inhale 2 puffs into the lungs every 6 (six) hours as needed for wheezing or shortness of breath. 3 Inhaler 3  . amLODipine (NORVASC) 5 MG tablet TAKE 1 TABLET BY MOUTH EVERY DAY 90 tablet 3  . aspirin 81 MG tablet Take 81 mg by mouth every evening.     . B-D 3CC LUER-LOK SYR 22GX1" 22G X 1" 3 ML MISC USE  EVERY 14 DAYS AS DIRECTED 34 each 2  . calcitRIOL (ROCALTROL) 0.25 MCG capsule TAKE ONE CAPSULE BY MOUTH EVERY DAY 90 capsule 2  . carvedilol (COREG) 25 MG tablet TAKE 1 TABLET BY MOUTH TWICE A DAY WITH MEALS 180 tablet 1  . Cholecalciferol 2000 UNITS TABS Take 2,000 Units by mouth daily.     . clonazePAM (KLONOPIN) 0.25 MG disintegrating tablet Take 1 tablet (0.25 mg total) by mouth 2 (two) times daily as needed (anxiety). 60 tablet 1  . clopidogrel (PLAVIX) 75 MG tablet TAKE 1 TABLET BY MOUTH EVERY DAY 90 tablet 2  . fluticasone furoate-vilanterol (BREO ELLIPTA) 100-25 MCG/INH AEPB Inhale 1 puff into the lungs daily. 1 each 5  .  levothyroxine (SYNTHROID, LEVOTHROID) 150 MCG tablet TAKE 1 TABLET BY MOUTH DAILY 90 tablet 2  . nitroGLYCERIN (NITROSTAT) 0.4 MG SL tablet Place 1 tablet (0.4 mg total) under the tongue every 5 (five) minutes as needed. 20 tablet 3  . nortriptyline (PAMELOR) 10 MG capsule Take 1 capsule (10 mg total) by mouth at bedtime. 30 capsule 5  . olmesartan (BENICAR) 20 MG tablet Take 1 tablet (20 mg total) by mouth daily. 90 tablet 3  . sildenafil (VIAGRA) 100 MG tablet Take 100 mg by mouth daily as needed for erectile dysfunction.     . simvastatin (ZOCOR) 10 MG tablet TAKE 1 TABLET AT BEDTIME (Patient taking differently: TAKE 10 MG BY MOUTH AT BEDTIME) 90 tablet 3  . testosterone cypionate (DEPOTESTOSTERONE CYPIONATE) 200 MG/ML injection INJECT 2 ML INTO THE MUSCLE EVERY 2 WEEKS 12 mL 1   No current facility-administered medications for this visit.     Allergies as of 02/23/2017 - Review Complete 02/23/2017  Allergen Reaction Noted  . Allantoin-pramoxine Other (See Comments)   . Metoprolol tartrate Other (See Comments) 05/12/2009    Family History  Problem Relation Age of Onset  . Lymphoma Father   . Cancer Father     lymphoma  . Hypertension    . Vision loss Maternal Grandmother   . Colon cancer Son   . Asthma Neg Hx   . Esophageal cancer Neg Hx   . Rectal cancer Neg Hx   . Stomach cancer Neg Hx     Social History   Social History  . Marital status: Married    Spouse name: Scott Oneal  . Number of children: 3  . Years of education: N/A   Occupational History  . retired Retired    Engineer, production   Social History Main Topics  . Smoking status: Never Smoker  . Smokeless tobacco: Never Used  . Alcohol use No  . Drug use: No  . Sexual activity: Yes   Other Topics Concern  . Not on file   Social History Narrative   Retired.   Regular Exercise-Yes; yoga & silver sneakers   Daily Caffeine Use.             Review of Systems:    Constitutional: No weight loss, fever or  chills Skin: No rash  Cardiovascular: No chest pain Respiratory: No SOB  Gastrointestinal: See HPI and otherwise negative Genitourinary: No dysuria  Neurological: No headache, dizziness or syncope Musculoskeletal: No new muscle or joint pain Hematologic: No bleeding or bruising Psychiatric: No history of depression or anxiety   Physical Exam:  Vital signs: BP (!) 142/72   Pulse 86   Ht 6' (1.829 m)   Wt 223 lb (101.2 kg)   BMI 30.24 kg/m   Constitutional:  Pleasant Caucasian male appears to be in NAD, Well developed, Well nourished, alert and cooperative Head:  Normocephalic and atraumatic. Eyes:   PEERL, EOMI. No icterus. Conjunctiva pink. Ears:  Normal auditory acuity. Neck:  Supple Throat: Oral cavity and pharynx without inflammation, swelling or lesion.  Respiratory: Respirations even and unlabored. Lungs clear to auscultation bilaterally.   No wheezes, crackles, or rhonchi.  Cardiovascular: Normal S1, S2. No MRG. Regular rate and rhythm. No peripheral edema, cyanosis or pallor.  Gastrointestinal:  Soft, nondistended, nontender. No rebound or guarding. Normal bowel sounds. No appreciable masses or hepatomegaly. Rectal:  Not performed.  Msk:  Symmetrical without gross deformities. Without edema, no deformity or joint abnormality.  Neurologic:  Alert and  oriented x4;  grossly normal neurologically.  Skin:   Dry and intact without significant lesions or rashes. Psychiatric:  Demonstrates good judgement and reason without abnormal affect or behaviors.  No recent labs.  Assessment: 1. Preprocedural examination for a surveillance colonoscopy in a patient with a history of tubular adenomas on chronic anticoagulation: Last colonoscopy January 2016, repeat was recommended in 2 years, patient is due now, he is maintained on Plavix for stent placement in 2005  Plan: 1. Patient will schedule his colonoscopy once called from our clinic after we receive notification that he is okay  to come off of his anticoagulation from his cardiologist. Discussed risks, benefits, limitations and alternatives and the patient agrees to proceed. This will need to be scheduled with Dr. Fuller Plan in the [facility]. 2. We advised the patient hold his Plavix for 5 days prior to his scheduled procedure. We will communicate with his cardiologist to ensure that this is acceptable. 3. Patient would like to reiterate that he is somewhat cautious about having this colonoscopy due to his post polypectomy bleed complications at time of last colonoscopy 4. Patient will follow in clinic per recommendations from Dr. Fuller Plan after time of procedure  Ellouise Newer, PA-C [facility] 02/23/2017, 12:06 PM  Cc: Cassandria Anger, MD

## 2017-02-23 NOTE — Telephone Encounter (Signed)
He had remote stenting. Ok to hold plavix 5 days for endoscopy.

## 2017-02-23 NOTE — Telephone Encounter (Signed)
Called and left VM to inform patient that the Shingrix vaccine prescription has been sent to his pharmacy. Nurse left contact number and requested that patient call her back for any further questions or concerns.

## 2017-02-23 NOTE — Progress Notes (Signed)
Reviewed and agree with management plan. Agree with the recommended 2 year interval for surveillance colonoscopy as he had a tubular adenoma with high grade dysplasia in 2016 and prior to that he had a tubulovillous adenoma. Hold Plavix for 5 days. ASA 81 ok for colonoscopy. No other NSAIDs, no fish oil and no Vit E for 7 days prior to colonoscopy.   Pricilla Riffle. Fuller Plan, MD [facility]

## 2017-02-24 DIAGNOSIS — N401 Enlarged prostate with lower urinary tract symptoms: Secondary | ICD-10-CM | POA: Diagnosis not present

## 2017-02-24 DIAGNOSIS — R972 Elevated prostate specific antigen [PSA]: Secondary | ICD-10-CM | POA: Diagnosis not present

## 2017-02-24 DIAGNOSIS — R311 Benign essential microscopic hematuria: Secondary | ICD-10-CM | POA: Diagnosis not present

## 2017-02-27 ENCOUNTER — Encounter: Payer: Self-pay | Admitting: Gastroenterology

## 2017-02-27 NOTE — Telephone Encounter (Signed)
Called and left message to schedule his colonoscopy. Patient did not schedule at appointment because he did not have his personal schedule with him.

## 2017-03-01 NOTE — Telephone Encounter (Signed)
LMOM

## 2017-03-02 DIAGNOSIS — M1711 Unilateral primary osteoarthritis, right knee: Secondary | ICD-10-CM | POA: Diagnosis not present

## 2017-03-08 ENCOUNTER — Telehealth: Payer: Self-pay | Admitting: Internal Medicine

## 2017-03-08 DIAGNOSIS — L821 Other seborrheic keratosis: Secondary | ICD-10-CM | POA: Diagnosis not present

## 2017-03-08 DIAGNOSIS — L738 Other specified follicular disorders: Secondary | ICD-10-CM | POA: Diagnosis not present

## 2017-03-08 DIAGNOSIS — L218 Other seborrheic dermatitis: Secondary | ICD-10-CM | POA: Diagnosis not present

## 2017-03-08 DIAGNOSIS — L570 Actinic keratosis: Secondary | ICD-10-CM | POA: Diagnosis not present

## 2017-03-08 MED ORDER — DICLOFENAC SODIUM 1 % TD GEL: 4.0000 g | Freq: Four times a day (QID) | TRANSDERMAL | 5 refills | Status: DC

## 2017-03-08 NOTE — Telephone Encounter (Signed)
Needs diclof gel Rx

## 2017-03-15 DIAGNOSIS — H02412 Mechanical ptosis of left eyelid: Secondary | ICD-10-CM | POA: Diagnosis not present

## 2017-03-15 DIAGNOSIS — H02411 Mechanical ptosis of right eyelid: Secondary | ICD-10-CM | POA: Diagnosis not present

## 2017-03-15 DIAGNOSIS — H02413 Mechanical ptosis of bilateral eyelids: Secondary | ICD-10-CM | POA: Diagnosis not present

## 2017-04-01 ENCOUNTER — Other Ambulatory Visit: Payer: Self-pay | Admitting: Internal Medicine

## 2017-04-11 ENCOUNTER — Ambulatory Visit (AMBULATORY_SURGERY_CENTER): Payer: Self-pay

## 2017-04-11 VITALS — Ht 72.0 in | Wt 219.6 lb

## 2017-04-11 DIAGNOSIS — Z800 Family history of malignant neoplasm of digestive organs: Secondary | ICD-10-CM

## 2017-04-11 DIAGNOSIS — Z86010 Personal history of colonic polyps: Secondary | ICD-10-CM

## 2017-04-11 NOTE — Progress Notes (Signed)
Pt came into the office today for his pre-visit prior to his colon with Dr Fuller Plan on 04/25/17. Pt states he is having some family issues and will be out of town and wants to resch his colon until later. Colon was rescheduled for 06/26/16 and his PV was rescheduled for 06/05/17. Colon on 04/25/17 was cx'd. Pt will see his cardiologist( Dr. Burt Knack) in July to discuss orders for holding his Plavix. He will call back if he has further questions.

## 2017-04-25 ENCOUNTER — Encounter: Payer: PPO | Admitting: Gastroenterology

## 2017-04-26 ENCOUNTER — Other Ambulatory Visit: Payer: Self-pay | Admitting: Internal Medicine

## 2017-05-03 ENCOUNTER — Other Ambulatory Visit: Payer: Self-pay | Admitting: Internal Medicine

## 2017-05-12 ENCOUNTER — Encounter: Payer: Self-pay | Admitting: Cardiovascular Disease

## 2017-05-12 ENCOUNTER — Ambulatory Visit (INDEPENDENT_AMBULATORY_CARE_PROVIDER_SITE_OTHER): Payer: PPO | Admitting: Cardiovascular Disease

## 2017-05-12 VITALS — BP 124/70 | HR 67 | Ht 72.0 in | Wt 219.0 lb

## 2017-05-12 DIAGNOSIS — I2510 Atherosclerotic heart disease of native coronary artery without angina pectoris: Secondary | ICD-10-CM

## 2017-05-12 DIAGNOSIS — I10 Essential (primary) hypertension: Secondary | ICD-10-CM

## 2017-05-12 NOTE — Progress Notes (Signed)
Cardiology Office Note Date:  05/12/2017   ID:  [name], [name] [birth year], MRN [id number]  PCP:  Cassandria Anger, MD  Cardiologist:  Sherren Mocha, MD    Chief Complaint  Patient presents with  . Follow-up     History of Present Illness: Scott Oneal is a 76 y.o. male who presents for follow-up evaluation. The patient has been followed for coronary artery disease. Other medical problems include chronic kidney disease, hypertension, and hyperlipidemia.  He initially presented in 2005 with an inferior wall myocardial infarction. He was treated with overlapping Cypher stents in the right coronary artery. He underwent staged PCI of the left circumflex during his index hospitalization. He has been maintained on long-term dual antiplatelet therapy with aspirin and Plavix. He was noted to have diffuse nonobstructive disease of the LAD. LV function was preserved at the time of his myocardial infarction with an estimated ejection fraction of 60%.  He is here alone today. Wants to have a colonoscopy and asks about stopping plavix for the procedure. Today, he denies symptoms of palpitations, chest pain, shortness of breath, orthopnea, PND, lower extremity edema, dizziness, or syncope.  Past Medical History:  Diagnosis Date  . Anxiety   . Arthritis   . Benign neoplasm of colon   . Calculus of gallbladder without mention of cholecystitis   . Cataract    bilateral cateracts removed  . Celiac disease   . Cholelithiasis   . Coronary atherosclerosis of unspecified type of vessel, native or graft   . Diarrhea   . Esophageal reflux   . Glaucoma   . Gout, unspecified   . Hyperparathyroidism   . Long term (current) use of anticoagulants   . Loss of weight   . Old myocardial infarction   . Osteoporosis, unspecified   . Other malaise and fatigue   . Other specified cardiac dysrhythmias(427.89)   . Other testicular hypofunction   . Personal history of colonic polyps   . Pure  hypercholesterolemia   . Renal insufficiency    chronic  . Unspecified asthma(493.90)   . Unspecified essential hypertension   . Unspecified hypothyroidism     Past Surgical History:  Procedure Laterality Date  . COLON SURGERY    . COLONOSCOPY    . COLONOSCOPY WITH PROPOFOL N/A 11/27/2014   Procedure: COLONOSCOPY WITH PROPOFOL;  Surgeon: Milus Banister, MD;  Location: [facility];  Service: Endoscopy;  Laterality: N/A;  . CORONARY ANGIOPLASTY    . CORONARY STENT PLACEMENT  2011   Cypher; in distal circumflex artery  . CYSTOSCOPY  1998  . EYE SURGERY    . KNEE SURGERY     right  . SHOULDER SURGERY Left 9/14   Dr Shara Blazing  . UPPER GASTROINTESTINAL ENDOSCOPY    . VASCULAR SURGERY      Current Outpatient Prescriptions  Medication Sig Dispense Refill  . albuterol (PROAIR HFA) 108 (90 Base) MCG/ACT inhaler Inhale 2 puffs into the lungs every 6 (six) hours as needed for wheezing or shortness of breath. 3 Inhaler 3  . amLODipine (NORVASC) 5 MG tablet TAKE 1 TABLET BY MOUTH EVERY DAY 90 tablet 3  . aspirin 81 MG tablet Take 81 mg by mouth every evening.     . B-D 3CC LUER-LOK SYR 22GX1" 22G X 1" 3 ML MISC USE EVERY 14 DAYS AS DIRECTED 34 each 2  . calcitRIOL (ROCALTROL) 0.25 MCG capsule Take 1 capsule (0.25 mcg total) by mouth daily. Annual appt due in Oct must  see MD for refills 90 capsule 1  . carvedilol (COREG) 25 MG tablet TAKE 1 TABLET BY MOUTH TWICE A DAY WITH MEALS 180 tablet 1  . Cholecalciferol 2000 UNITS TABS Take 2,000 Units by mouth daily.     . clonazePAM (KLONOPIN) 0.25 MG disintegrating tablet Take 1 tablet (0.25 mg total) by mouth 2 (two) times daily as needed (anxiety). 60 tablet 1  . clopidogrel (PLAVIX) 75 MG tablet Take 1 tablet (75 mg total) by mouth daily. Annual appt due in Oct must see MD for refills 90 tablet 1  . diclofenac sodium (VOLTAREN) 1 % GEL Apply 4 g topically 4 (four) times daily. 200 g 5  . fluticasone furoate-vilanterol (BREO ELLIPTA) 100-25 MCG/INH  AEPB Inhale 1 puff into the lungs daily. 1 each 5  . levothyroxine (SYNTHROID, LEVOTHROID) 150 MCG tablet TAKE 1 TABLET BY MOUTH DAILY 90 tablet 0  . nitroGLYCERIN (NITROSTAT) 0.4 MG SL tablet Place 0.4 mg under the tongue every 5 (five) minutes as needed for chest pain.    . nortriptyline (PAMELOR) 10 MG capsule Take 1 capsule (10 mg total) by mouth at bedtime. 30 capsule 5  . olmesartan (BENICAR) 20 MG tablet Take 1 tablet (20 mg total) by mouth daily. 90 tablet 3  . sildenafil (VIAGRA) 100 MG tablet Take 100 mg by mouth daily as needed for erectile dysfunction.     . simvastatin (ZOCOR) 10 MG tablet Take 10 mg by mouth daily.    Marland Kitchen testosterone cypionate (DEPOTESTOSTERONE CYPIONATE) 200 MG/ML injection INJECT 2 ML INTO THE MUSCLE EVERY 2 WEEKS 12 mL 1   No current facility-administered medications for this visit.     Allergies:   Allantoin-pramoxine and Metoprolol tartrate   Social History:  The patient  reports that he has never smoked. He has never used smokeless tobacco. He reports that he does not drink alcohol or use drugs.   Family History:  The patient's family history includes Cancer in his father; Colon cancer in his son; Hypertension in his unknown relative; Lymphoma in his father; Vision loss in his maternal grandmother.    ROS:  Please see the history of present illness. All other systems are reviewed and negative.    PHYSICAL EXAM: VS:  BP 124/70   Pulse 67   Ht 6' (1.829 m)   Wt 219 lb (99.3 kg)   BMI 29.70 kg/m  , BMI Body mass index is 29.7 kg/m. GEN: Well nourished, well developed, in no acute distress  HEENT: normal  Neck: no JVD, no masses. No carotid bruits Cardiac: RRR without murmur or gallop                Respiratory:  clear to auscultation bilaterally, normal work of breathing GI: soft, nontender, nondistended, + BS MS: no deformity or atrophy  Ext: trace bilateral pretibial edema, pedal pulses 2+= bilaterally Skin: warm and dry, no rash Neuro:   Strength and sensation are intact Psych: euthymic mood, full affect  EKG:  EKG is ordered today. The ekg ordered today shows Normal sinus rhythm 67 bpm, occasional PVC  Recent Labs: 06/30/2016: TSH 2.44 08/22/2016: ALT 16; BUN 16; Creatinine, Ser 2.21; Hemoglobin 16.3; Platelets 123.0; Potassium 4.7; Sodium 142   Lipid Panel     Component Value Date/Time   CHOL 131 02/12/2015 0735   TRIG 98.0 02/12/2015 0735   HDL 37.50 (L) 02/12/2015 0735   CHOLHDL 3 02/12/2015 0735   VLDL 19.6 02/12/2015 0735   LDLCALC 74 02/12/2015 0735  Wt Readings from Last 3 Encounters:  05/12/17 219 lb (99.3 kg)  04/11/17 219 lb 9.6 oz (99.6 kg)  02/23/17 223 lb (101.2 kg)     Cardiac Studies Reviewed: ABIs 09/14/2016: 1.1 bilaterally with normal waveforms  ASSESSMENT AND PLAN: 1.  CAD, native vessel, without symptoms of angina:The patient is doing very well. He is 13 years out from PCI. We discussed the pros and cons of continued antiplatelet therapy. He is bruising easily. I think it is reasonable to discontinue Plavix at this point in he should remain on aspirin 81 mg daily. No other changes in his medications are made today.  2. Hypertension: Blood pressure is well controlled. He reports readings of 120s over 60s to 70s when he checks his blood pressure at the gym. His medicines are reviewed and will be continued.  3. Hyperlipidemia: Lipids are at goal with an LDL of 74, HDL 38, total cholesterol 131, and triglycerides 98. He will continue on simvastatin.  4. Chronic kidney disease stage III: Patient is treated with an ARB.  Current medicines are reviewed with the patient today.  The patient does not have concerns regarding medicines.  Labs/ tests ordered today include:   Orders Placed This Encounter  Procedures  . EKG 12-Lead    Disposition:   FU one year  Signed, Sherren Mocha, MD  05/12/2017 10:55 AM    [facility] [place], [place], [place]   [address] Phone: [phone]; Fax: [phone]

## 2017-05-12 NOTE — Patient Instructions (Signed)
Medication Instructions:  Your physician has recommended you make the following change in your medication:  1. STOP Plavix (clopidogrel) when you finish your current supply  Labwork: No new orders.   Testing/Procedures: No new orders.   Follow-Up: Your physician wants you to follow-up in: 1 YEAR with Dr Burt Knack.  You will receive a reminder letter in the mail two months in advance. If you don't receive a letter, please call our office to schedule the follow-up appointment.   Any Other Special Instructions Will Be Listed Below (If Applicable).     If you need a refill on your cardiac medications before your next appointment, please call your pharmacy.

## 2017-05-17 ENCOUNTER — Encounter: Payer: Self-pay | Admitting: Internal Medicine

## 2017-05-17 ENCOUNTER — Ambulatory Visit (INDEPENDENT_AMBULATORY_CARE_PROVIDER_SITE_OTHER): Payer: PPO | Admitting: Internal Medicine

## 2017-05-17 VITALS — BP 132/76 | HR 67 | Temp 98.3°F | Ht 72.0 in | Wt 220.0 lb

## 2017-05-17 DIAGNOSIS — E785 Hyperlipidemia, unspecified: Secondary | ICD-10-CM | POA: Diagnosis not present

## 2017-05-17 DIAGNOSIS — I10 Essential (primary) hypertension: Secondary | ICD-10-CM | POA: Diagnosis not present

## 2017-05-17 DIAGNOSIS — M7070 Other bursitis of hip, unspecified hip: Secondary | ICD-10-CM | POA: Insufficient documentation

## 2017-05-17 DIAGNOSIS — M7072 Other bursitis of hip, left hip: Secondary | ICD-10-CM | POA: Diagnosis not present

## 2017-05-17 DIAGNOSIS — N183 Chronic kidney disease, stage 3 unspecified: Secondary | ICD-10-CM

## 2017-05-17 DIAGNOSIS — F411 Generalized anxiety disorder: Secondary | ICD-10-CM

## 2017-05-17 DIAGNOSIS — N320 Bladder-neck obstruction: Secondary | ICD-10-CM

## 2017-05-17 DIAGNOSIS — J4520 Mild intermittent asthma, uncomplicated: Secondary | ICD-10-CM | POA: Diagnosis not present

## 2017-05-17 DIAGNOSIS — I2510 Atherosclerotic heart disease of native coronary artery without angina pectoris: Secondary | ICD-10-CM

## 2017-05-17 DIAGNOSIS — J45909 Unspecified asthma, uncomplicated: Secondary | ICD-10-CM | POA: Insufficient documentation

## 2017-05-17 MED ORDER — NORTRIPTYLINE HCL 25 MG PO CAPS: 25.0000 mg | ORAL_CAPSULE | Freq: Every day | ORAL | 5 refills | Status: DC

## 2017-05-17 MED ORDER — ALBUTEROL SULFATE HFA 108 (90 BASE) MCG/ACT IN AERS: 2.0000 | INHALATION_SPRAY | Freq: Four times a day (QID) | RESPIRATORY_TRACT | 3 refills | Status: DC | PRN

## 2017-05-17 NOTE — Assessment & Plan Note (Signed)
Memory foam pad Ice

## 2017-05-17 NOTE — Assessment & Plan Note (Signed)
Try Anoro

## 2017-05-17 NOTE — Assessment & Plan Note (Signed)
Klonopin prn  Potential benefits of a long term benzodiazepines  use as well as potential risks  and complications were explained to the patient and were aknowledged.

## 2017-05-17 NOTE — Assessment & Plan Note (Signed)
Olmesartan and Coreg

## 2017-05-17 NOTE — Patient Instructions (Signed)
Use a memory foam pad

## 2017-05-17 NOTE — Assessment & Plan Note (Signed)
On ASA, off Plavix

## 2017-05-17 NOTE — Assessment & Plan Note (Signed)
Labs

## 2017-05-17 NOTE — Progress Notes (Signed)
Subjective:  Patient ID: Scott Oneal, male    DOB: [birth year]  Age: 76 y.o. MRN: [id number]  CC: No chief complaint on file.   HPI Scott Oneal presents for sharp pain in the L buttock after he would sit for a while x 3 mo C/o insomnia, anxiety, fatigue: waking up at 2:30 - long time. Pamelor was weak... C/o tremor; dad had it C/o stress  Outpatient Medications Prior to Visit  Medication Sig Dispense Refill  . albuterol (PROAIR HFA) 108 (90 Base) MCG/ACT inhaler Inhale 2 puffs into the lungs every 6 (six) hours as needed for wheezing or shortness of breath. 3 Inhaler 3  . amLODipine (NORVASC) 5 MG tablet TAKE 1 TABLET BY MOUTH EVERY DAY 90 tablet 3  . aspirin 81 MG tablet Take 81 mg by mouth every evening.     . B-D 3CC LUER-LOK SYR 22GX1" 22G X 1" 3 ML MISC USE EVERY 14 DAYS AS DIRECTED 34 each 2  . calcitRIOL (ROCALTROL) 0.25 MCG capsule Take 1 capsule (0.25 mcg total) by mouth daily. Annual appt due in Oct must see MD for refills 90 capsule 1  . carvedilol (COREG) 25 MG tablet TAKE 1 TABLET BY MOUTH TWICE A DAY WITH MEALS 180 tablet 1  . Cholecalciferol 2000 UNITS TABS Take 2,000 Units by mouth daily.     . clonazePAM (KLONOPIN) 0.25 MG disintegrating tablet Take 1 tablet (0.25 mg total) by mouth 2 (two) times daily as needed (anxiety). 60 tablet 1  . diclofenac sodium (VOLTAREN) 1 % GEL Apply 4 g topically 4 (four) times daily. 200 g 5  . fluticasone furoate-vilanterol (BREO ELLIPTA) 100-25 MCG/INH AEPB Inhale 1 puff into the lungs daily. 1 each 5  . levothyroxine (SYNTHROID, LEVOTHROID) 150 MCG tablet TAKE 1 TABLET BY MOUTH DAILY 90 tablet 0  . nitroGLYCERIN (NITROSTAT) 0.4 MG SL tablet Place 0.4 mg under the tongue every 5 (five) minutes as needed for chest pain.    . nortriptyline (PAMELOR) 10 MG capsule Take 1 capsule (10 mg total) by mouth at bedtime. 30 capsule 5  . olmesartan (BENICAR) 20 MG tablet Take 1 tablet (20 mg total) by mouth daily. 90 tablet 3  . sildenafil  (VIAGRA) 100 MG tablet Take 100 mg by mouth daily as needed for erectile dysfunction.     . simvastatin (ZOCOR) 10 MG tablet Take 10 mg by mouth daily.    Marland Kitchen testosterone cypionate (DEPOTESTOSTERONE CYPIONATE) 200 MG/ML injection INJECT 2 ML INTO THE MUSCLE EVERY 2 WEEKS 12 mL 1  . clopidogrel (PLAVIX) 75 MG tablet Take 1 tablet (75 mg total) by mouth daily. Annual appt due in Oct must see MD for refills 90 tablet 1   No facility-administered medications prior to visit.     ROS Review of Systems  Constitutional: Positive for fatigue. Negative for appetite change and unexpected weight change.  HENT: Negative for congestion, nosebleeds, sneezing, sore throat and trouble swallowing.   Eyes: Negative for itching and visual disturbance.  Respiratory: Negative for cough.   Cardiovascular: Negative for chest pain, palpitations and leg swelling.  Gastrointestinal: Negative for abdominal distention, blood in stool, diarrhea and nausea.  Genitourinary: Negative for frequency and hematuria.  Musculoskeletal: Positive for arthralgias and back pain. Negative for gait problem, joint swelling and neck pain.  Skin: Negative for rash.  Neurological: Negative for dizziness, tremors, speech difficulty and weakness.  Psychiatric/Behavioral: Positive for sleep disturbance. Negative for agitation and dysphoric mood. The patient is nervous/anxious.  Objective:  BP 132/76 (BP Location: Left Arm, Patient Position: Sitting, Cuff Size: Large)   Pulse 67   Temp 98.3 F (36.8 C) (Oral)   Ht 6' (1.829 m)   Wt 220 lb (99.8 kg)   SpO2 98%   BMI 29.84 kg/m   BP Readings from Last 3 Encounters:  05/17/17 132/76  05/12/17 124/70  02/23/17 (!) 142/72    Wt Readings from Last 3 Encounters:  05/17/17 220 lb (99.8 kg)  05/12/17 219 lb (99.3 kg)  04/11/17 219 lb 9.6 oz (99.6 kg)    Physical Exam  Constitutional: He is oriented to person, place, and time. He appears well-developed. No distress.  NAD  HENT:   Mouth/Throat: Oropharynx is clear and moist.  Eyes: Pupils are equal, round, and reactive to light. Conjunctivae are normal.  Neck: Normal range of motion. No JVD present. No thyromegaly present.  Cardiovascular: Normal rate, regular rhythm, normal heart sounds and intact distal pulses.  Exam reveals no gallop and no friction rub.   No murmur heard. Pulmonary/Chest: Effort normal and breath sounds normal. No respiratory distress. He has no wheezes. He has no rales. He exhibits no tenderness.  Abdominal: Soft. Bowel sounds are normal. He exhibits no distension and no mass. There is no tenderness. There is no rebound and no guarding.  Musculoskeletal: Normal range of motion. He exhibits tenderness. He exhibits no edema.  Lymphadenopathy:    He has no cervical adenopathy.  Neurological: He is alert and oriented to person, place, and time. He has normal reflexes. No cranial nerve deficit. He exhibits normal muscle tone. He displays a negative Romberg sign. Coordination and gait normal.  Skin: Skin is warm and dry. No rash noted.  Psychiatric: He has a normal mood and affect. His behavior is normal. Judgment and thought content normal.  L ischial bursa - tender  Lab Results  Component Value Date   WBC 6.2 08/22/2016   HGB 16.3 08/22/2016   HCT 48.6 08/22/2016   PLT 123.0 (L) 08/22/2016   GLUCOSE 86 08/22/2016   CHOL 131 02/12/2015   TRIG 98.0 02/12/2015   HDL 37.50 (L) 02/12/2015   LDLCALC 74 02/12/2015   ALT 16 08/22/2016   AST 22 08/22/2016   NA 142 08/22/2016   K 4.7 08/22/2016   CL 105 08/22/2016   CREATININE 2.21 (H) 08/22/2016   BUN 16 08/22/2016   CO2 33 (H) 08/22/2016   TSH 2.44 06/30/2016   PSA 3.58 08/22/2016   INR 1.20 11/26/2014   HGBA1C 5.9 03/15/2016    Dg Elbow Complete Left  Result Date: 11/21/2016 CLINICAL DATA:  77 year old male fell 3-4 weeks ago. Left elbow pain. Initial encounter. EXAM: LEFT ELBOW - COMPLETE 3+ VIEW COMPARISON:  None. FINDINGS: No fracture  or dislocation. Top-normal size anterior fat pad without plain film evidence of joint effusion. No significant joint space narrowing. IMPRESSION: No fracture or dislocation. Electronically Signed   By: Genia Del M.D.   On: 11/21/2016 09:59    Assessment & Plan:   There are no diagnoses linked to this encounter. I have discontinued Mr. Munch clopidogrel. I am also having him maintain his sildenafil, clonazePAM, aspirin, Cholecalciferol, albuterol, nortriptyline, fluticasone furoate-vilanterol, B-D 3CC LUER-LOK SYR 22GX1", amLODipine, olmesartan, carvedilol, testosterone cypionate, diclofenac sodium, calcitRIOL, levothyroxine, nitroGLYCERIN, and simvastatin.  No orders of the defined types were placed in this encounter.    Follow-up: No Follow-up on file.  Walker Kehr, MD

## 2017-05-18 ENCOUNTER — Other Ambulatory Visit (INDEPENDENT_AMBULATORY_CARE_PROVIDER_SITE_OTHER): Payer: PPO

## 2017-05-18 DIAGNOSIS — M7072 Other bursitis of hip, left hip: Secondary | ICD-10-CM | POA: Diagnosis not present

## 2017-05-18 DIAGNOSIS — F411 Generalized anxiety disorder: Secondary | ICD-10-CM

## 2017-05-18 DIAGNOSIS — N183 Chronic kidney disease, stage 3 unspecified: Secondary | ICD-10-CM

## 2017-05-18 DIAGNOSIS — N320 Bladder-neck obstruction: Secondary | ICD-10-CM | POA: Diagnosis not present

## 2017-05-18 DIAGNOSIS — E785 Hyperlipidemia, unspecified: Secondary | ICD-10-CM

## 2017-05-18 LAB — CBC WITH DIFFERENTIAL/PLATELET
BASOS PCT: 0.8 % (ref 0.0–3.0)
Basophils Absolute: 0 10*3/uL (ref 0.0–0.1)
EOS PCT: 3 % (ref 0.0–5.0)
Eosinophils Absolute: 0.2 10*3/uL (ref 0.0–0.7)
HCT: 51.4 % (ref 39.0–52.0)
Hemoglobin: 17 g/dL (ref 13.0–17.0)
LYMPHS ABS: 1.1 10*3/uL (ref 0.7–4.0)
Lymphocytes Relative: 20.7 % (ref 12.0–46.0)
MCHC: 33.1 g/dL (ref 30.0–36.0)
MCV: 94.6 fl (ref 78.0–100.0)
MONOS PCT: 9 % (ref 3.0–12.0)
Monocytes Absolute: 0.5 10*3/uL (ref 0.1–1.0)
NEUTROS ABS: 3.4 10*3/uL (ref 1.4–7.7)
NEUTROS PCT: 66.5 % (ref 43.0–77.0)
PLATELETS: 90 10*3/uL — AB (ref 150.0–400.0)
RBC: 5.44 Mil/uL (ref 4.22–5.81)
RDW: 14.4 % (ref 11.5–15.5)
WBC: 5.2 10*3/uL (ref 4.0–10.5)

## 2017-05-18 LAB — URINALYSIS
Bilirubin Urine: NEGATIVE
Hgb urine dipstick: NEGATIVE
Ketones, ur: NEGATIVE
Leukocytes, UA: NEGATIVE
NITRITE: NEGATIVE
PH: 8 (ref 5.0–8.0)
SPECIFIC GRAVITY, URINE: 1.015 (ref 1.000–1.030)
TOTAL PROTEIN, URINE-UPE24: NEGATIVE
Urine Glucose: NEGATIVE
Urobilinogen, UA: 0.2 (ref 0.0–1.0)

## 2017-05-18 LAB — LIPID PANEL
CHOLESTEROL: 118 mg/dL (ref 0–200)
HDL: 40.6 mg/dL (ref >39.00)
LDL CALC: 61 mg/dL (ref 0–99)
NonHDL: 77.86
TRIGLYCERIDES: 82 mg/dL (ref 0.0–149.0)
Total CHOL/HDL Ratio: 3
VLDL: 16.4 mg/dL (ref 0.0–40.0)

## 2017-05-18 LAB — HEPATIC FUNCTION PANEL
ALT: 17 U/L (ref 0–53)
AST: 21 U/L (ref 0–37)
Albumin: 3.9 g/dL (ref 3.5–5.2)
Alkaline Phosphatase: 47 U/L (ref 39–117)
BILIRUBIN DIRECT: 0.2 mg/dL (ref 0.0–0.3)
BILIRUBIN TOTAL: 0.8 mg/dL (ref 0.2–1.2)
TOTAL PROTEIN: 5.7 g/dL — AB (ref 6.0–8.3)

## 2017-05-18 LAB — BASIC METABOLIC PANEL
BUN: 16 mg/dL (ref 6–23)
CALCIUM: 9 mg/dL (ref 8.4–10.5)
CO2: 31 meq/L (ref 19–32)
CREATININE: 1.89 mg/dL — AB (ref 0.40–1.50)
Chloride: 105 mEq/L (ref 96–112)
GFR: 37.07 mL/min — ABNORMAL LOW (ref >60.00)
Glucose, Bld: 89 mg/dL (ref 70–99)
Potassium: 4.6 mEq/L (ref 3.5–5.1)
Sodium: 141 mEq/L (ref 135–145)

## 2017-05-18 LAB — MICROALBUMIN / CREATININE URINE RATIO
Creatinine,U: 208.7 mg/dL
MICROALB/CREAT RATIO: 0.6 mg/g (ref 0.0–30.0)
Microalb, Ur: 1.2 mg/dL (ref 0.0–1.9)

## 2017-05-18 LAB — PSA: PSA: 2.91 ng/mL (ref 0.10–4.00)

## 2017-05-18 LAB — TSH: TSH: 3.13 u[IU]/mL (ref 0.35–4.50)

## 2017-06-06 ENCOUNTER — Ambulatory Visit (AMBULATORY_SURGERY_CENTER): Payer: Self-pay

## 2017-06-06 VITALS — Ht 72.0 in | Wt 219.2 lb

## 2017-06-06 DIAGNOSIS — Z86010 Personal history of colonic polyps: Secondary | ICD-10-CM

## 2017-06-06 MED ORDER — NA SULFATE-K SULFATE-MG SULF 17.5-3.13-1.6 GM/177ML PO SOLN: 1.0000 | Freq: Once | ORAL | 0 refills | Status: AC

## 2017-06-06 NOTE — Progress Notes (Signed)
Denies allergies to eggs or soy products. Denies complication of anesthesia or sedation. Denies use of weight loss medication. Denies use of O2.   Emmi instructions given for colonoscopy.  

## 2017-06-12 ENCOUNTER — Encounter: Payer: Self-pay | Admitting: Gastroenterology

## 2017-06-22 ENCOUNTER — Encounter: Payer: Self-pay | Admitting: Internal Medicine

## 2017-06-23 ENCOUNTER — Other Ambulatory Visit: Payer: Self-pay | Admitting: Internal Medicine

## 2017-06-23 MED ORDER — CLONAZEPAM 0.25 MG PO TBDP: 0.2500 mg | ORAL_TABLET | Freq: Two times a day (BID) | ORAL | 1 refills | Status: DC | PRN

## 2017-06-26 ENCOUNTER — Ambulatory Visit (AMBULATORY_SURGERY_CENTER): Payer: PPO | Admitting: Gastroenterology

## 2017-06-26 ENCOUNTER — Encounter: Payer: Self-pay | Admitting: Gastroenterology

## 2017-06-26 VITALS — BP 126/79 | HR 60 | Temp 98.4°F | Resp 13 | Ht 72.0 in | Wt 219.0 lb

## 2017-06-26 DIAGNOSIS — D122 Benign neoplasm of ascending colon: Secondary | ICD-10-CM

## 2017-06-26 DIAGNOSIS — K219 Gastro-esophageal reflux disease without esophagitis: Secondary | ICD-10-CM | POA: Diagnosis not present

## 2017-06-26 DIAGNOSIS — Z800 Family history of malignant neoplasm of digestive organs: Secondary | ICD-10-CM | POA: Diagnosis not present

## 2017-06-26 DIAGNOSIS — D120 Benign neoplasm of cecum: Secondary | ICD-10-CM | POA: Diagnosis not present

## 2017-06-26 DIAGNOSIS — I252 Old myocardial infarction: Secondary | ICD-10-CM | POA: Diagnosis not present

## 2017-06-26 DIAGNOSIS — D123 Benign neoplasm of transverse colon: Secondary | ICD-10-CM

## 2017-06-26 DIAGNOSIS — I2510 Atherosclerotic heart disease of native coronary artery without angina pectoris: Secondary | ICD-10-CM | POA: Diagnosis not present

## 2017-06-26 DIAGNOSIS — Z86010 Personal history of colonic polyps: Secondary | ICD-10-CM

## 2017-06-26 HISTORY — PX: COLONOSCOPY: SHX174

## 2017-06-26 MED ORDER — SODIUM CHLORIDE 0.9 % IV SOLN: 500.0000 mL | INTRAVENOUS | Status: DC

## 2017-06-26 NOTE — Op Note (Signed)
Scott Oneal Patient Name: Scott Oneal Procedure Date: 06/26/2017 11:23 AM MRN: [id number] Endoscopist: Ladene Artist , MD Age: 76 Referring MD:  Date of Birth: 03-May-1941 Gender: Male Account #: [id number] Procedure:                Colonoscopy Indications:              High risk colon cancer surveillance: Personal                            history of adenoma with high grade dysplasia.                            Family history of colon cancer, 1st degree relative                            < 60. Medicines:                Monitored Anesthesia Care Procedure:                Pre-Anesthesia Assessment:                           - Prior to the procedure, a History and Physical                            was performed, and patient medications and                            allergies were reviewed. The patient's tolerance of                            previous anesthesia was also reviewed. The risks                            and benefits of the procedure and the sedation                            options and risks were discussed with the patient.                            All questions were answered, and informed consent                            was obtained. Prior Anticoagulants: The patient has                            taken no previous anticoagulant or antiplatelet                            agents. ASA Grade Assessment: II - A patient with                            mild systemic disease. After reviewing the risks  and benefits, the patient was deemed in                            satisfactory condition to undergo the procedure.                           After obtaining informed consent, the colonoscope                            was passed under direct vision. Throughout the                            procedure, the patient's blood pressure, pulse, and                            oxygen saturations were monitored continuously. The                   Colonoscope was introduced through the anus and                            advanced to the the cecum, identified by                            appendiceal orifice and ileocecal valve. The                            ileocecal valve, appendiceal orifice, and rectum                            were photographed. The quality of the bowel                            preparation was good. The colonoscopy was performed                            without difficulty. The patient tolerated the                            procedure well. Scope In: 11:37:00 AM Scope Out: 11:54:09 AM Scope Withdrawal Time: 0 hours 14 minutes 54 seconds  Total Procedure Duration: 0 hours 17 minutes 9 seconds  Findings:                 The perianal and digital rectal examinations were                            normal.                           A 10 mm polyp was found in the ascending colon. The                            polyp was semi-pedunculated. The polyp was removed  with a hot snare. Resection and retrieval were                            complete.                           Three sessile polyps were found in the transverse                            colon and hepatic flexure. The polyps were 6 to 7                            mm in size. These polyps were removed with a cold                            snare. Resection and retrieval were complete.                           Two sessile polyps were found in the cecum. The                            polyps were 4 mm in size. These polyps were removed                            with a cold biopsy forceps. Resection and retrieval                            were complete.                           Internal hemorrhoids were found during                            retroflexion. The hemorrhoids were medium-sized and                            Grade I (internal hemorrhoids that do not prolapse).                           The exam was  otherwise without abnormality on                            direct and retroflexion views. Complications:            No immediate complications. Estimated blood loss:                            None. Estimated Blood Loss:     Estimated blood loss: none. Impression:               - One 10 mm polyp in the ascending colon, removed                            with a hot snare. Resected and retrieved.                           -  Three 6 to 7 mm polyps in the transverse colon                            and at the hepatic flexure, removed with a cold                            snare. Resected and retrieved.                           - Two 4 mm polyps in the cecum, removed with a cold                            biopsy forceps. Resected and retrieved.                           - Internal hemorrhoids.                           - The examination was otherwise normal on direct                            and retroflexion views. Recommendation:           - Repeat colonoscopy in 3 years for surveillance.                           - Patient has a contact number available for                            emergencies. The signs and symptoms of potential                            delayed complications were discussed with the                            patient. Return to normal activities tomorrow.                            Written discharge instructions were provided to the                            patient.                           - Resume previous diet.                           - Continue present medications.                           - Await pathology results.                           - No aspirin, ibuprofen, naproxen, or other  non-steroidal anti-inflammatory drugs for 2 weeks                            after polyp removal.                           - No Vit E, fish oil for 2 weeks after polyp                            removal.                           - No strenous  activities for 2 weeks after polyp                            removal. Ladene Artist, MD 06/26/2017 12:01:33 PM This report has been signed electronically.

## 2017-06-26 NOTE — Progress Notes (Signed)
Called to room to assist during endoscopic procedure.  Patient ID and intended procedure confirmed with present staff. Received instructions for my participation in the procedure from the performing physician.  

## 2017-06-26 NOTE — Progress Notes (Signed)
Pt's states no medical or surgical changes since previsit or office visit. 

## 2017-06-26 NOTE — Progress Notes (Signed)
Report given to PACU, vss 

## 2017-06-26 NOTE — Patient Instructions (Signed)
Impression/Recommendations:  Polyp handout given to patient. Hemorrhoid handout given to patient.  Repeat colonoscopy in 3 years for surveillance.  Resume previous diet. Continue present medications.  No aspirin, ibuprofen, naproxen, or other NSAID drugs for 2 weeks.  Tylenol only until Sept. 11, 2018. No Vitamin E, or fish oil for 2 weeks. No strenuous activities for 2 weeks.  YOU HAD AN ENDOSCOPIC PROCEDURE TODAY AT [facility]:   Refer to the procedure report that was given to you for any specific questions about what was found during the examination.  If the procedure report does not answer your questions, please call your gastroenterologist to clarify.  If you requested that your care partner not be given the details of your procedure findings, then the procedure report has been included in a sealed envelope for you to review at your convenience later.  YOU SHOULD EXPECT: Some feelings of bloating in the abdomen. Passage of more gas than usual.  Walking can help get rid of the air that was put into your GI tract during the procedure and reduce the bloating. If you had a lower endoscopy (such as a colonoscopy or flexible sigmoidoscopy) you may notice spotting of blood in your stool or on the toilet paper. If you underwent a bowel prep for your procedure, you may not have a normal bowel movement for a few days.  Please Note:  You might notice some irritation and congestion in your nose or some drainage.  This is from the oxygen used during your procedure.  There is no need for concern and it should clear up in a day or so.  SYMPTOMS TO REPORT IMMEDIATELY:   Following lower endoscopy (colonoscopy or flexible sigmoidoscopy):  Excessive amounts of blood in the stool  Significant tenderness or worsening of abdominal pains  Swelling of the abdomen that is new, acute  Fever of 100F or higher  For urgent or emergent issues, a gastroenterologist can be reached at any hour by  calling [phone].   DIET:  We do recommend a small meal at first, but then you may proceed to your regular diet.  Drink plenty of fluids but you should avoid alcoholic beverages for 24 hours.  ACTIVITY:  You should plan to take it easy for the rest of today and you should NOT DRIVE or use heavy machinery until tomorrow (because of the sedation medicines used during the test).    FOLLOW UP: Our staff will call the number listed on your records the next business day following your procedure to check on you and address any questions or concerns that you may have regarding the information given to you following your procedure. If we do not reach you, we will leave a message.  However, if you are feeling well and you are not experiencing any problems, there is no need to return our call.  We will assume that you have returned to your regular daily activities without incident.  If any biopsies were taken you will be contacted by phone or by letter within the next 1-3 weeks.  Please call us at [phone] if you have not heard about the biopsies in 3 weeks.    SIGNATURES/CONFIDENTIALITY: You and/or your care partner have signed paperwork which will be entered into your electronic medical record.  These signatures attest to the fact that that the information above on your After Visit Summary has been reviewed and is understood.  Full responsibility of the confidentiality of this discharge information lies  with you and/or your care-partner.

## 2017-06-27 ENCOUNTER — Telehealth: Payer: Self-pay | Admitting: *Deleted

## 2017-06-27 NOTE — Telephone Encounter (Signed)
  Follow up Call-  Call back number 06/26/2017 11/18/2014  Post procedure Call Back phone  # [phone] hm  Permission to leave phone message Yes Yes  Some recent data might be hidden     Patient questions:  Do you have a fever, pain , or abdominal swelling? No. Pain Score  0 *  Have you tolerated food without any problems? Yes.    Have you been able to return to your normal activities? Yes.    Do you have any questions about your discharge instructions: Diet   No. Medications  No. Follow up visit  No.  Do you have questions or concerns about your Care? No.  Actions: * If pain score is 4 or above: No action needed, pain <4.

## 2017-06-29 ENCOUNTER — Encounter: Payer: Self-pay | Admitting: Gastroenterology

## 2017-06-29 NOTE — Telephone Encounter (Signed)
Entered in error.  No call made. maw

## 2017-07-14 DIAGNOSIS — R319 Hematuria, unspecified: Secondary | ICD-10-CM | POA: Diagnosis not present

## 2017-07-14 DIAGNOSIS — N183 Chronic kidney disease, stage 3 (moderate): Secondary | ICD-10-CM | POA: Diagnosis not present

## 2017-07-14 DIAGNOSIS — Z6841 Body Mass Index (BMI) 40.0 and over, adult: Secondary | ICD-10-CM | POA: Diagnosis not present

## 2017-07-14 DIAGNOSIS — I10 Essential (primary) hypertension: Secondary | ICD-10-CM | POA: Diagnosis not present

## 2017-07-18 ENCOUNTER — Encounter: Payer: Self-pay | Admitting: Internal Medicine

## 2017-07-18 ENCOUNTER — Ambulatory Visit (INDEPENDENT_AMBULATORY_CARE_PROVIDER_SITE_OTHER): Payer: PPO | Admitting: Internal Medicine

## 2017-07-18 VITALS — BP 126/70 | HR 62 | Temp 97.6°F | Ht 72.0 in | Wt 220.0 lb

## 2017-07-18 DIAGNOSIS — M7061 Trochanteric bursitis, right hip: Secondary | ICD-10-CM | POA: Diagnosis not present

## 2017-07-18 DIAGNOSIS — M7071 Other bursitis of hip, right hip: Secondary | ICD-10-CM | POA: Insufficient documentation

## 2017-07-18 DIAGNOSIS — E034 Atrophy of thyroid (acquired): Secondary | ICD-10-CM | POA: Diagnosis not present

## 2017-07-18 DIAGNOSIS — I10 Essential (primary) hypertension: Secondary | ICD-10-CM

## 2017-07-18 DIAGNOSIS — M2550 Pain in unspecified joint: Secondary | ICD-10-CM

## 2017-07-18 DIAGNOSIS — L570 Actinic keratosis: Secondary | ICD-10-CM

## 2017-07-18 DIAGNOSIS — K900 Celiac disease: Secondary | ICD-10-CM | POA: Diagnosis not present

## 2017-07-18 MED ORDER — METHYLPREDNISOLONE ACETATE 80 MG/ML IJ SUSP
80.0000 mg | Freq: Once | INTRAMUSCULAR | Status: AC
Start: 2017-07-18 — End: 2017-07-18
  Administered 2017-07-18: 80 mg via INTRA_ARTICULAR

## 2017-07-18 NOTE — Assessment & Plan Note (Signed)
See procedure 

## 2017-07-18 NOTE — Patient Instructions (Signed)
Postprocedure instructions :    A Band-Aid should be left on for 12 hours. Injection therapy is not a cure itself. It is used in conjunction with other modalities. You can use nonsteroidal anti-inflammatories like ibuprofen , hot and cold compresses. Rest is recommended in the next 24 hours. You need to report immediately  if fever, chills or any signs of infection develop. 

## 2017-07-18 NOTE — Progress Notes (Signed)
Subjective:  Patient ID: Scott Oneal, male    DOB: July 14, 1941  Age: 76 y.o. MRN: [id number]  CC: No chief complaint on file.   HPI Scott Oneal presents for HTN, CAD, CRI f/u. Off Plavix now C/o R hip pain hurts w/walking, going up steps C/o skin lesions on face  Outpatient Medications Prior to Visit  Medication Sig Dispense Refill  . albuterol (PROAIR HFA) 108 (90 Base) MCG/ACT inhaler Inhale 2 puffs into the lungs every 6 (six) hours as needed for wheezing or shortness of breath. 3 Inhaler 3  . amLODipine (NORVASC) 5 MG tablet TAKE 1 TABLET BY MOUTH EVERY DAY 90 tablet 3  . aspirin 81 MG tablet Take 81 mg by mouth every evening.     . B-D 3CC LUER-LOK SYR 22GX1" 22G X 1" 3 ML MISC USE EVERY 14 DAYS AS DIRECTED 34 each 2  . calcitRIOL (ROCALTROL) 0.25 MCG capsule Take 1 capsule (0.25 mcg total) by mouth daily. Annual appt due in Oct must see MD for refills 90 capsule 1  . carvedilol (COREG) 25 MG tablet TAKE 1 TABLET BY MOUTH TWICE A DAY WITH MEALS 180 tablet 1  . Cholecalciferol 2000 UNITS TABS Take 2,000 Units by mouth daily.     . clonazePAM (KLONOPIN) 0.25 MG disintegrating tablet Take 1 tablet (0.25 mg total) by mouth 2 (two) times daily as needed (anxiety). 60 tablet 1  . diclofenac sodium (VOLTAREN) 1 % GEL Apply 4 g topically 4 (four) times daily. 200 g 5  . fluticasone furoate-vilanterol (BREO ELLIPTA) 100-25 MCG/INH AEPB Inhale 1 puff into the lungs daily. 1 each 5  . levothyroxine (SYNTHROID, LEVOTHROID) 150 MCG tablet TAKE 1 TABLET BY MOUTH DAILY 90 tablet 0  . nitroGLYCERIN (NITROSTAT) 0.4 MG SL tablet Place 0.4 mg under the tongue every 5 (five) minutes as needed for chest pain.    . nortriptyline (PAMELOR) 25 MG capsule Take 1-2 capsules (25-50 mg total) by mouth at bedtime. 60 capsule 5  . olmesartan (BENICAR) 20 MG tablet Take 1 tablet (20 mg total) by mouth daily. 90 tablet 3  . sildenafil (VIAGRA) 100 MG tablet Take 100 mg by mouth daily as needed for erectile  dysfunction.     . simvastatin (ZOCOR) 10 MG tablet Take 10 mg by mouth daily.    Marland Kitchen testosterone cypionate (DEPOTESTOSTERONE CYPIONATE) 200 MG/ML injection INJECT 2 ML INTO THE MUSCLE EVERY 2 WEEKS 12 mL 1   Facility-Administered Medications Prior to Visit  Medication Dose Route Frequency Provider Last Rate Last Dose  . 0.9 %  sodium chloride infusion  500 mL Intravenous Continuous Ladene Artist, MD        ROS Review of Systems  Constitutional: Negative for appetite change, fatigue and unexpected weight change.  HENT: Negative for congestion, nosebleeds, sneezing, sore throat and trouble swallowing.   Eyes: Negative for itching and visual disturbance.  Respiratory: Negative for cough.   Cardiovascular: Negative for chest pain, palpitations and leg swelling.  Gastrointestinal: Negative for abdominal distention, blood in stool, diarrhea and nausea.  Genitourinary: Negative for frequency and hematuria.  Musculoskeletal: Positive for arthralgias. Negative for back pain, gait problem, joint swelling and neck pain.  Skin: Negative for rash.  Neurological: Negative for dizziness, tremors, speech difficulty and weakness.  Psychiatric/Behavioral: Negative for agitation, dysphoric mood and sleep disturbance. The patient is nervous/anxious.     Objective:  BP 126/70 (BP Location: Left Arm, Patient Position: Sitting, Cuff Size: Large)   Pulse 62  Temp 97.6 F (36.4 C) (Oral)   Ht 6' (1.829 m)   Wt 220 lb (99.8 kg)   SpO2 99%   BMI 29.84 kg/m   BP Readings from Last 3 Encounters:  07/18/17 126/70  06/26/17 126/79  05/17/17 132/76    Wt Readings from Last 3 Encounters:  07/18/17 220 lb (99.8 kg)  06/26/17 219 lb (99.3 kg)  06/06/17 219 lb 3.2 oz (99.4 kg)    Physical Exam  Constitutional: He is oriented to person, place, and time. He appears well-developed. No distress.  NAD  HENT:  Mouth/Throat: Oropharynx is clear and moist.  Eyes: Pupils are equal, round, and reactive to  light. Conjunctivae are normal.  Neck: Normal range of motion. No JVD present. No thyromegaly present.  Cardiovascular: Normal rate, regular rhythm, normal heart sounds and intact distal pulses.  Exam reveals no gallop and no friction rub.   No murmur heard. Pulmonary/Chest: Effort normal and breath sounds normal. No respiratory distress. He has no wheezes. He has no rales. He exhibits no tenderness.  Abdominal: Soft. Bowel sounds are normal. He exhibits no distension and no mass. There is no tenderness. There is no rebound and no guarding.  Musculoskeletal: Normal range of motion. He exhibits no edema or tenderness.  Lymphadenopathy:    He has no cervical adenopathy.  Neurological: He is alert and oriented to person, place, and time. He has normal reflexes. No cranial nerve deficit. He exhibits normal muscle tone. He displays a negative Romberg sign. Coordination and gait normal.  Skin: Skin is warm and dry. No rash noted.  Psychiatric: He has a normal mood and affect. His behavior is normal. Judgment and thought content normal.  AKs on face R hip is very tender    Procedure Note :     Procedure : Cryosurgery   Indication:  Actinic keratosis(es)   Risks including unsuccessful procedure , bleeding, infection, bruising, scar, a need for a repeat  procedure and others were explained to the patient in detail as well as the benefits. Informed consent was obtained verbally.    5 lesion(s)  on face and scalp was/were treated with liquid nitrogen on a Q-tip in a usual fasion . Band-Aid was applied and antibiotic ointment was given for a later use.   Tolerated well. Complications none.   Postprocedure instructions :      Procedure Note :     Procedure : Joint Injection,  R  hip   Indication:  Trochanteric bursitis with refractory  chronic pain.   Risks including unsuccessful procedure , bleeding, infection, bruising, skin atrophy, "steroid flare-up" and others were explained to the  patient in detail as well as the benefits. Informed consent was obtained and signed.   Tthe patient was placed in a comfortable lateral decubitus position. The point of maximal tenderness was identified. Skin was prepped with Betadine and alcohol. Then, a 5 cc syringe with a 2 inch long 24-gauge needle was used for a bursa injection.. The needle was advanced  Into the bursa. I injected the bursa with 4 mL of 2% lidocaine and 80 mg of Depo-Medrol .  Band-Aid was applied.   Tolerated well. Complications: None. Good pain relief following the procedure.    Lab Results  Component Value Date   WBC 5.2 05/18/2017   HGB 17.0 05/18/2017   HCT 51.4 05/18/2017   PLT 90.0 (L) 05/18/2017   GLUCOSE 89 05/18/2017   CHOL 118 05/18/2017   TRIG 82.0 05/18/2017   HDL 40.60  05/18/2017   LDLCALC 61 05/18/2017   ALT 17 05/18/2017   AST 21 05/18/2017   NA 141 05/18/2017   K 4.6 05/18/2017   CL 105 05/18/2017   CREATININE 1.89 (H) 05/18/2017   BUN 16 05/18/2017   CO2 31 05/18/2017   TSH 3.13 05/18/2017   PSA 2.91 05/18/2017   INR 1.20 11/26/2014   HGBA1C 5.9 03/15/2016   MICROALBUR 1.2 05/18/2017    Dg Elbow Complete Left  Result Date: 11/21/2016 CLINICAL DATA:  76 year old male fell 3-4 weeks ago. Left elbow pain. Initial encounter. EXAM: LEFT ELBOW - COMPLETE 3+ VIEW COMPARISON:  None. FINDINGS: No fracture or dislocation. Top-normal size anterior fat pad without plain film evidence of joint effusion. No significant joint space narrowing. IMPRESSION: No fracture or dislocation. Electronically Signed   By: Genia Del M.D.   On: 11/21/2016 09:59    Assessment & Plan:   There are no diagnoses linked to this encounter. I am having Mr. Vecchio maintain his sildenafil, aspirin, Cholecalciferol, fluticasone furoate-vilanterol, B-D 3CC LUER-LOK SYR 22GX1", amLODipine, olmesartan, carvedilol, testosterone cypionate, diclofenac sodium, calcitRIOL, levothyroxine, nitroGLYCERIN, simvastatin, nortriptyline,  albuterol, clonazePAM, and multivitamin. We will continue to administer sodium chloride.  Meds ordered this encounter  Medications  . Multiple Vitamin (MULTIVITAMIN) capsule    Sig: Take 1 capsule by mouth daily.     Follow-up: No Follow-up on file.  Walker Kehr, MD

## 2017-07-18 NOTE — Assessment & Plan Note (Signed)
On Levothroid

## 2017-07-18 NOTE — Assessment & Plan Note (Signed)
Gluten free

## 2017-07-18 NOTE — Assessment & Plan Note (Signed)
Amlodipine, Coreg

## 2017-07-18 NOTE — Assessment & Plan Note (Signed)
OA discussed

## 2017-07-20 ENCOUNTER — Ambulatory Visit (INDEPENDENT_AMBULATORY_CARE_PROVIDER_SITE_OTHER): Payer: PPO | Admitting: General Practice

## 2017-07-20 DIAGNOSIS — Z23 Encounter for immunization: Secondary | ICD-10-CM

## 2017-07-26 ENCOUNTER — Other Ambulatory Visit: Payer: Self-pay | Admitting: Internal Medicine

## 2017-08-05 ENCOUNTER — Other Ambulatory Visit: Payer: Self-pay | Admitting: Internal Medicine

## 2017-08-10 ENCOUNTER — Other Ambulatory Visit: Payer: Self-pay | Admitting: Internal Medicine

## 2017-09-15 ENCOUNTER — Other Ambulatory Visit (INDEPENDENT_AMBULATORY_CARE_PROVIDER_SITE_OTHER): Payer: PPO

## 2017-09-15 ENCOUNTER — Ambulatory Visit (INDEPENDENT_AMBULATORY_CARE_PROVIDER_SITE_OTHER)
Admission: RE | Admit: 2017-09-15 | Discharge: 2017-09-15 | Disposition: A | Discharge status: Home / Self Care | Payer: PPO | Source: Ambulatory Visit | Attending: Internal Medicine | Admitting: Internal Medicine

## 2017-09-15 ENCOUNTER — Ambulatory Visit: Payer: PPO | Admitting: Internal Medicine

## 2017-09-15 ENCOUNTER — Encounter: Payer: Self-pay | Admitting: Internal Medicine

## 2017-09-15 VITALS — BP 136/86 | HR 63 | Temp 97.8°F | Ht 72.0 in | Wt 221.0 lb

## 2017-09-15 DIAGNOSIS — I10 Essential (primary) hypertension: Secondary | ICD-10-CM | POA: Diagnosis not present

## 2017-09-15 DIAGNOSIS — R059 Cough, unspecified: Secondary | ICD-10-CM

## 2017-09-15 DIAGNOSIS — R350 Frequency of micturition: Secondary | ICD-10-CM | POA: Diagnosis not present

## 2017-09-15 DIAGNOSIS — R05 Cough: Secondary | ICD-10-CM

## 2017-09-15 DIAGNOSIS — R079 Chest pain, unspecified: Secondary | ICD-10-CM | POA: Diagnosis not present

## 2017-09-15 DIAGNOSIS — R0602 Shortness of breath: Secondary | ICD-10-CM | POA: Diagnosis not present

## 2017-09-15 LAB — URINALYSIS, ROUTINE W REFLEX MICROSCOPIC
Bilirubin Urine: NEGATIVE
HGB URINE DIPSTICK: NEGATIVE
Ketones, ur: NEGATIVE
NITRITE: NEGATIVE
RBC / HPF: NONE SEEN (ref 0–?)
SPECIFIC GRAVITY, URINE: 1.01 (ref 1.000–1.030)
TOTAL PROTEIN, URINE-UPE24: NEGATIVE
Urine Glucose: NEGATIVE
Urobilinogen, UA: 0.2 (ref 0.0–1.0)
pH: 7 (ref 5.0–8.0)

## 2017-09-15 MED ORDER — HYDROCODONE-HOMATROPINE 5-1.5 MG/5ML PO SYRP: 5.0000 mL | ORAL_SOLUTION | Freq: Four times a day (QID) | ORAL | 0 refills | Status: DC | PRN

## 2017-09-15 MED ORDER — HYDROCODONE-HOMATROPINE 5-1.5 MG/5ML PO SYRP: 5.0000 mL | ORAL_SOLUTION | Freq: Four times a day (QID) | ORAL | 0 refills | Status: AC | PRN

## 2017-09-15 MED ORDER — LEVOFLOXACIN 250 MG PO TABS: 250.0000 mg | ORAL_TABLET | Freq: Every day | ORAL | 0 refills | Status: AC

## 2017-09-15 NOTE — Progress Notes (Signed)
Subjective:    Patient ID: Scott Oneal, male    DOB: Dec 14, 1940, 76 y.o.   MRN: [id number]  HPI  Here with acute visit encouraged by his Son who is ENT in [facility]; pt is feeling acutely ill with symptoms of aching all over, feverish, marked fatigue/washed out, low appetite, mild HA, all assoc with 2 days onset prod cough greenish sputum.  Here with acute onset mild to mod 2-3 days ST, HA, general weakness and malaise, with prod cough greenish sputum and mid right sided pleuritic sharp CP, but Pt denies increased sob or doe, wheezing, orthopnea, PND, increased LE swelling, palpitations, dizziness or syncope.  Also mentions increased frequency of loose but not diarrheal stools.  ALso has some increased urinary freq, but has been trying to drink more fluids and Denies urinary symptoms such as dysuria, urgency, flank pain, hematuria or n/v.  Past Medical History:  Diagnosis Date  . Allergy   . Anxiety   . Arthritis   . Asthma   . Benign neoplasm of colon   . Calculus of gallbladder without mention of cholecystitis   . Cataract    bilateral cateracts removed  . Celiac disease   . Cholelithiasis   . Coronary atherosclerosis of unspecified type of vessel, native or graft   . Diarrhea   . Esophageal reflux   . Glaucoma   . Gout, unspecified   . Hyperparathyroidism   . Long term (current) use of anticoagulants   . Loss of weight   . Old myocardial infarction   . Osteoporosis, unspecified   . Other malaise and fatigue   . Other specified cardiac dysrhythmias(427.89)   . Other testicular hypofunction   . Personal history of colonic polyps   . Pure hypercholesterolemia   . Renal insufficiency    chronic  . Unspecified asthma(493.90)   . Unspecified essential hypertension   . Unspecified hypothyroidism    Past Surgical History:  Procedure Laterality Date  . COLON SURGERY    . COLONOSCOPY    . COLONOSCOPY WITH PROPOFOL N/A 11/27/2014   Performed by Milus Banister[facility] at [facility]  ENDOSCOPY  . CORONARY ANGIOPLASTY    . CORONARY STENT PLACEMENT  2011   Cypher; in distal circumflex artery  . CYSTOSCOPY  1998  . EYE SURGERY    . KNEE SURGERY     right  . SHOULDER SURGERY Left 9/14   Dr Shara Blazing  . UPPER GASTROINTESTINAL ENDOSCOPY    . VASCULAR SURGERY      reports that  has never smoked. he has never used smokeless tobacco. He reports that he does not drink alcohol or use drugs. family history includes Cancer in his father; Colon cancer in his son; Hypertension in his unknown relative; Lymphoma in his father; Vision loss in his maternal grandmother. Allergies  Allergen Reactions  . Allantoin-Pramoxine Other (See Comments)    Pt does't remember reaction  . Metoprolol Tartrate Other (See Comments)    REACTION: fatigue   Current Outpatient Medications on File Prior to Visit  Medication Sig Dispense Refill  . albuterol (PROAIR HFA) 108 (90 Base) MCG/ACT inhaler Inhale 2 puffs into the lungs every 6 (six) hours as needed for wheezing or shortness of breath. 3 Inhaler 3  . amLODipine (NORVASC) 5 MG tablet TAKE 1 TABLET BY MOUTH EVERY DAY 90 tablet 3  . aspirin 81 MG tablet Take 81 mg by mouth every evening.     . B-D 3CC LUER-LOK SYR 22GX1" 22G X  1" 3 ML MISC USE EVERY 14 DAYS AS DIRECTED 34 each 2  . calcitRIOL (ROCALTROL) 0.25 MCG capsule Take 1 capsule (0.25 mcg total) by mouth daily. Annual appt due in Oct must see MD for refills 90 capsule 1  . carvedilol (COREG) 25 MG tablet TAKE 1 TABLET BY MOUTH TWICE A DAY WITH MEALS 180 tablet 1  . Cholecalciferol 2000 UNITS TABS Take 2,000 Units by mouth daily.     . clonazePAM (KLONOPIN) 0.25 MG disintegrating tablet Take 1 tablet (0.25 mg total) by mouth 2 (two) times daily as needed (anxiety). 60 tablet 1  . diclofenac sodium (VOLTAREN) 1 % GEL Apply 4 g topically 4 (four) times daily. 200 g 5  . levothyroxine (SYNTHROID, LEVOTHROID) 150 MCG tablet Take 1 tablet (150 mcg total) by mouth daily. 90 tablet 1  . Multiple  Vitamin (MULTIVITAMIN) capsule Take 1 capsule by mouth daily.    . nitroGLYCERIN (NITROSTAT) 0.4 MG SL tablet Place 0.4 mg under the tongue every 5 (five) minutes as needed for chest pain.    . nortriptyline (PAMELOR) 25 MG capsule Take 1-2 capsules (25-50 mg total) by mouth at bedtime. 60 capsule 5  . olmesartan (BENICAR) 20 MG tablet Take 1 tablet (20 mg total) by mouth daily. 90 tablet 3  . sildenafil (VIAGRA) 100 MG tablet Take 100 mg by mouth daily as needed for erectile dysfunction.     . simvastatin (ZOCOR) 10 MG tablet Take 10 mg by mouth daily.    Marland Kitchen testosterone cypionate (DEPOTESTOSTERONE CYPIONATE) 200 MG/ML injection INJECT 2 ML INTO THE MUSCLE EVERY 2 WEEKS 12 mL 1   Current Facility-Administered Medications on File Prior to Visit  Medication Dose Route Frequency Provider Last Rate Last Dose  . 0.9 %  sodium chloride infusion  500 mL Intravenous Continuous Ladene Artist, MD       Review of Systems  Constitutional: Negative for other unusual diaphoresis or sweats HENT: Negative for ear discharge or swelling Eyes: Negative for other worsening visual disturbances Respiratory: Negative for stridor or other swelling  Gastrointestinal: Negative for worsening distension or other blood Genitourinary: Negative for retention or other urinary change Musculoskeletal: Negative for other MSK pain or swelling Skin: Negative for color change or other new lesions Neurological: Negative for worsening tremors and other numbness  Psychiatric/Behavioral: Negative for worsening agitation or other fatigue All other system neg per pt    Objective:   Physical Exam BP 136/86   Pulse 63   Temp 97.8 F (36.6 C) (Oral)   Ht 6' (1.829 m)   Wt 221 lb (100.2 kg)   SpO2 98%   BMI 29.97 kg/m  VS noted, mild ill appaering Constitutional: Pt appears in NAD HENT: Head: NCAT.  Right Ear: External ear normal.  Left Ear: External ear normal.  Eyes: . Pupils are equal, round, and reactive to light.  Conjunctivae and EOM are normal Nose: without d/c or deformity Bilat tm's with mild erythema.  Max sinus areas non tender.  Pharynx with mild erythema, no exudate  Neck: Neck supple. Gross normal ROM Cardiovascular: Normal rate and regular rhythm.   Pulmonary/Chest: Effort normal and breath sounds decreased without rales or frank wheezing.  Neurological: Pt is alert. At baseline orientation, motor grossly intact Skin: Skin is warm. No rashes, other new lesions, no LE edema Psychiatric: Pt behavior is normal without agitation  No other exam findings    Assessment & Plan:

## 2017-09-15 NOTE — Patient Instructions (Signed)
Please take all new medication as prescribed  - the antibiotic, and cough medicine if needed  Please continue all other medications as before, including your inhaler  Please have the pharmacy call with any other refills you may need.  Please keep your appointments with your specialists as you may have planned  Please go to the XRAY Department in the Basement (go straight as you get off the elevator) for the x-ray testing  Please go to the LAB in the Basement (turn left off the elevator) for the tests to be done today  You will be contacted by phone if any changes need to be made immediately.  Otherwise, you will receive a letter about your results with an explanation, but please check with MyChart first.  Please remember to sign up for MyChart if you have not done so, as this will be important to you in the future with finding out test results, communicating by private email, and scheduling acute appointments online when needed.

## 2017-09-16 LAB — URINE CULTURE
MICRO NUMBER:: 81295471
Result:: NO GROWTH
SPECIMEN QUALITY:: ADEQUATE

## 2017-09-16 NOTE — Assessment & Plan Note (Signed)
Mild to mod, likely infectiuos related, c/w bronchitis vs pna, for cxr, for antibx course, cough med prn,  to f/u any worsening symptoms or concerns

## 2017-09-16 NOTE — Assessment & Plan Note (Signed)
Etiology unclear, for urine studies,  to f/u any worsening symptoms or concerns

## 2017-09-16 NOTE — Assessment & Plan Note (Signed)
stable overall by history and exam, recent data reviewed with pt, and pt to continue medical treatment as before,  to f/u any worsening symptoms or concerns BP Readings from Last 3 Encounters:  09/15/17 136/86  07/18/17 126/70  06/26/17 126/79

## 2017-10-06 ENCOUNTER — Ambulatory Visit: Payer: PPO | Admitting: Internal Medicine

## 2017-10-06 ENCOUNTER — Encounter: Payer: Self-pay | Admitting: Internal Medicine

## 2017-10-06 DIAGNOSIS — N183 Chronic kidney disease, stage 3 unspecified: Secondary | ICD-10-CM

## 2017-10-06 DIAGNOSIS — F411 Generalized anxiety disorder: Secondary | ICD-10-CM

## 2017-10-06 DIAGNOSIS — K900 Celiac disease: Secondary | ICD-10-CM | POA: Diagnosis not present

## 2017-10-06 DIAGNOSIS — I10 Essential (primary) hypertension: Secondary | ICD-10-CM | POA: Diagnosis not present

## 2017-10-06 DIAGNOSIS — G8929 Other chronic pain: Secondary | ICD-10-CM | POA: Diagnosis not present

## 2017-10-06 DIAGNOSIS — E034 Atrophy of thyroid (acquired): Secondary | ICD-10-CM

## 2017-10-06 DIAGNOSIS — M545 Low back pain, unspecified: Secondary | ICD-10-CM

## 2017-10-06 DIAGNOSIS — J4520 Mild intermittent asthma, uncomplicated: Secondary | ICD-10-CM | POA: Diagnosis not present

## 2017-10-06 MED ORDER — PROMETHAZINE-CODEINE 6.25-10 MG/5ML PO SYRP: 5.0000 mL | ORAL_SOLUTION | ORAL | 0 refills | Status: DC | PRN

## 2017-10-06 MED ORDER — BUDESONIDE-FORMOTEROL FUMARATE 160-4.5 MCG/ACT IN AERO: 2.0000 | INHALATION_SPRAY | Freq: Two times a day (BID) | RESPIRATORY_TRACT | 11 refills | Status: DC

## 2017-10-06 NOTE — Assessment & Plan Note (Signed)
Klonopin

## 2017-10-06 NOTE — Assessment & Plan Note (Signed)
F/u w/Dr Tamala Julian

## 2017-10-06 NOTE — Assessment & Plan Note (Signed)
F/u w/Nephrology

## 2017-10-06 NOTE — Assessment & Plan Note (Signed)
Symbicort

## 2017-10-06 NOTE — Assessment & Plan Note (Signed)
Levothroid

## 2017-10-06 NOTE — Assessment & Plan Note (Signed)
Gluten free

## 2017-10-06 NOTE — Progress Notes (Signed)
Subjective:  Patient ID: Scott Oneal, male    DOB: [birth year]  Age: 76 y.o. MRN: [id number]  CC: No chief complaint on file.   HPI Scott Oneal presents for HTN, CAD, dyslipidemia f/u C/o cough post-URI  Outpatient Medications Prior to Visit  Medication Sig Dispense Refill  . albuterol (PROAIR HFA) 108 (90 Base) MCG/ACT inhaler Inhale 2 puffs into the lungs every 6 (six) hours as needed for wheezing or shortness of breath. 3 Inhaler 3  . amLODipine (NORVASC) 5 MG tablet TAKE 1 TABLET BY MOUTH EVERY DAY 90 tablet 3  . aspirin 81 MG tablet Take 81 mg by mouth every evening.     . B-D 3CC LUER-LOK SYR 22GX1" 22G X 1" 3 ML MISC USE EVERY 14 DAYS AS DIRECTED 34 each 2  . calcitRIOL (ROCALTROL) 0.25 MCG capsule Take 1 capsule (0.25 mcg total) by mouth daily. Annual appt due in Oct must see MD for refills 90 capsule 1  . carvedilol (COREG) 25 MG tablet TAKE 1 TABLET BY MOUTH TWICE A DAY WITH MEALS 180 tablet 1  . Cholecalciferol 2000 UNITS TABS Take 2,000 Units by mouth daily.     . clonazePAM (KLONOPIN) 0.25 MG disintegrating tablet Take 1 tablet (0.25 mg total) by mouth 2 (two) times daily as needed (anxiety). 60 tablet 1  . diclofenac sodium (VOLTAREN) 1 % GEL Apply 4 g topically 4 (four) times daily. 200 g 5  . levothyroxine (SYNTHROID, LEVOTHROID) 150 MCG tablet Take 1 tablet (150 mcg total) by mouth daily. 90 tablet 1  . Multiple Vitamin (MULTIVITAMIN) capsule Take 1 capsule by mouth daily.    . nitroGLYCERIN (NITROSTAT) 0.4 MG SL tablet Place 0.4 mg under the tongue every 5 (five) minutes as needed for chest pain.    . nortriptyline (PAMELOR) 25 MG capsule Take 1-2 capsules (25-50 mg total) by mouth at bedtime. 60 capsule 5  . olmesartan (BENICAR) 20 MG tablet Take 1 tablet (20 mg total) by mouth daily. 90 tablet 3  . sildenafil (VIAGRA) 100 MG tablet Take 100 mg by mouth daily as needed for erectile dysfunction.     . simvastatin (ZOCOR) 10 MG tablet Take 10 mg by mouth daily.    Marland Kitchen  testosterone cypionate (DEPOTESTOSTERONE CYPIONATE) 200 MG/ML injection INJECT 2 ML INTO THE MUSCLE EVERY 2 WEEKS 12 mL 1   Facility-Administered Medications Prior to Visit  Medication Dose Route Frequency Provider Last Rate Last Dose  . 0.9 %  sodium chloride infusion  500 mL Intravenous Continuous Ladene Artist, MD        ROS Review of Systems  Constitutional: Negative for appetite change, fatigue and unexpected weight change.  HENT: Negative for congestion, nosebleeds, sneezing, sore throat and trouble swallowing.   Eyes: Negative for itching and visual disturbance.  Respiratory: Positive for cough and wheezing.   Cardiovascular: Negative for chest pain, palpitations and leg swelling.  Gastrointestinal: Negative for abdominal distention, blood in stool, diarrhea and nausea.  Genitourinary: Negative for frequency and hematuria.  Musculoskeletal: Positive for arthralgias. Negative for back pain, gait problem, joint swelling and neck pain.  Skin: Negative for rash.  Neurological: Negative for dizziness, tremors, speech difficulty and weakness.  Psychiatric/Behavioral: Negative for agitation, dysphoric mood and sleep disturbance. The patient is not nervous/anxious.     Objective:  BP 122/74 (BP Location: Left Arm, Patient Position: Sitting, Cuff Size: Large)   Pulse 63   Temp 98 F (36.7 C) (Oral)   Ht 6' (1.829 m)  Wt 222 lb (100.7 kg)   SpO2 99%   BMI 30.11 kg/m   BP Readings from Last 3 Encounters:  10/06/17 122/74  09/15/17 136/86  07/18/17 126/70    Wt Readings from Last 3 Encounters:  10/06/17 222 lb (100.7 kg)  09/15/17 221 lb (100.2 kg)  07/18/17 220 lb (99.8 kg)    Physical Exam  Constitutional: He is oriented to person, place, and time. He appears well-developed. No distress.  NAD  HENT:  Mouth/Throat: Oropharynx is clear and moist.  Eyes: Conjunctivae are normal. Pupils are equal, round, and reactive to light.  Neck: Normal range of motion. No JVD  present. No thyromegaly present.  Cardiovascular: Normal rate, regular rhythm, normal heart sounds and intact distal pulses. Exam reveals no gallop and no friction rub.  No murmur heard. Pulmonary/Chest: Effort normal and breath sounds normal. No respiratory distress. He has no wheezes. He has no rales. He exhibits no tenderness.  Abdominal: Soft. Bowel sounds are normal. He exhibits no distension and no mass. There is no tenderness. There is no rebound and no guarding.  Musculoskeletal: Normal range of motion. He exhibits no edema or tenderness.  Lymphadenopathy:    He has no cervical adenopathy.  Neurological: He is alert and oriented to person, place, and time. He has normal reflexes. No cranial nerve deficit. He exhibits normal muscle tone. He displays a negative Romberg sign. Coordination and gait normal.  Skin: Skin is warm and dry. No rash noted.  Psychiatric: He has a normal mood and affect. His behavior is normal. Judgment and thought content normal.    Lab Results  Component Value Date   WBC 5.2 05/18/2017   HGB 17.0 05/18/2017   HCT 51.4 05/18/2017   PLT 90.0 (L) 05/18/2017   GLUCOSE 89 05/18/2017   CHOL 118 05/18/2017   TRIG 82.0 05/18/2017   HDL 40.60 05/18/2017   LDLCALC 61 05/18/2017   ALT 17 05/18/2017   AST 21 05/18/2017   NA 141 05/18/2017   K 4.6 05/18/2017   CL 105 05/18/2017   CREATININE 1.89 (H) 05/18/2017   BUN 16 05/18/2017   CO2 31 05/18/2017   TSH 3.13 05/18/2017   PSA 2.91 05/18/2017   INR 1.20 11/26/2014   HGBA1C 5.9 03/15/2016   MICROALBUR 1.2 05/18/2017    Dg Chest 2 View  Result Date: 09/15/2017 CLINICAL DATA:  Productive cough, shortness of breath and RIGHT chest pain for 5 days. EXAM: CHEST  2 VIEW COMPARISON:  Chest radiograph July 10, 2016 FINDINGS: Cardiac silhouette is mildly enlarged. Mediastinal silhouette is nonsuspicious. Trace calcific atherosclerosis aortic arch. Persistently elevated LEFT hemidiaphragm. No pleural effusion or  focal consolidation. No pneumothorax. Mild degenerative change of the spine. Soft tissue planes are nonsuspicious. IMPRESSION: Mild cardiomegaly.  No acute pulmonary process. Aortic Atherosclerosis (ICD10-I70.0). Electronically Signed   By: Elon Alas M.D.   On: 09/15/2017 13:55    Assessment & Plan:   There are no diagnoses linked to this encounter. I am having Scott Oneal "[facility]" maintain his sildenafil, aspirin, Cholecalciferol, B-D 3CC LUER-LOK SYR 22GX1", amLODipine, olmesartan, testosterone cypionate, diclofenac sodium, calcitRIOL, nitroGLYCERIN, simvastatin, nortriptyline, albuterol, clonazePAM, multivitamin, carvedilol, and levothyroxine. We will continue to administer sodium chloride.  No orders of the defined types were placed in this encounter.    Follow-up: No Follow-up on file.  Walker Kehr, MD

## 2017-10-06 NOTE — Assessment & Plan Note (Signed)
Amlodipine, Coreg Benicar

## 2017-10-20 ENCOUNTER — Encounter: Payer: Self-pay | Admitting: Internal Medicine

## 2017-10-21 ENCOUNTER — Other Ambulatory Visit: Payer: Self-pay | Admitting: Internal Medicine

## 2017-10-21 MED ORDER — METHYLPREDNISOLONE 4 MG PO TBPK: ORAL_TABLET | ORAL | 0 refills | Status: DC

## 2017-10-22 ENCOUNTER — Other Ambulatory Visit: Payer: Self-pay | Admitting: Internal Medicine

## 2017-10-23 ENCOUNTER — Encounter: Payer: Self-pay | Admitting: Family Medicine

## 2017-10-23 ENCOUNTER — Ambulatory Visit: Payer: PPO | Admitting: Family Medicine

## 2017-10-23 ENCOUNTER — Other Ambulatory Visit: Payer: Self-pay

## 2017-10-23 ENCOUNTER — Ambulatory Visit (INDEPENDENT_AMBULATORY_CARE_PROVIDER_SITE_OTHER): Payer: PPO

## 2017-10-23 VITALS — BP 152/91 | HR 100 | Temp 97.7°F | Resp 16 | Ht 72.0 in | Wt 220.0 lb

## 2017-10-23 DIAGNOSIS — R05 Cough: Secondary | ICD-10-CM

## 2017-10-23 DIAGNOSIS — J4541 Moderate persistent asthma with (acute) exacerbation: Secondary | ICD-10-CM | POA: Diagnosis not present

## 2017-10-23 DIAGNOSIS — R059 Cough, unspecified: Secondary | ICD-10-CM

## 2017-10-23 DIAGNOSIS — J209 Acute bronchitis, unspecified: Secondary | ICD-10-CM | POA: Diagnosis not present

## 2017-10-23 LAB — POCT CBC
GRANULOCYTE PERCENT: 88.9 % — AB (ref 37–80)
HEMATOCRIT: 53.5 % (ref 43.5–53.7)
Hemoglobin: 17.9 g/dL (ref 14.1–18.1)
Lymph, poc: 0.9 (ref 0.6–3.4)
MCH: 30.6 pg (ref 27–31.2)
MCHC: 33.5 g/dL (ref 31.8–35.4)
MCV: 91.3 fL (ref 80–97)
MID (CBC): 0.4 (ref 0–0.9)
MPV: 6.5 fL (ref 0–99.8)
POC GRANULOCYTE: 10.8 — AB (ref 2–6.9)
POC LYMPH %: 7.7 % — AB (ref 10–50)
POC MID %: 3.4 % (ref 0–12)
Platelet Count, POC: 132 10*3/uL — AB (ref 142–424)
RBC: 5.85 M/uL (ref 4.69–6.13)
RDW, POC: 16 %
WBC: 12.1 10*3/uL — AB (ref 4.6–10.2)

## 2017-10-23 MED ORDER — METHYLPREDNISOLONE SODIUM SUCC 125 MG IJ SOLR
75.0000 mg | Freq: Once | INTRAMUSCULAR | Status: AC
  Administered 2017-10-23: 75 mg via INTRAVENOUS

## 2017-10-23 MED ORDER — PREDNISONE 20 MG PO TABS: ORAL_TABLET | ORAL | 0 refills | Status: DC

## 2017-10-23 MED ORDER — BENZONATATE 100 MG PO CAPS: 100.0000 mg | ORAL_CAPSULE | Freq: Three times a day (TID) | ORAL | 0 refills | Status: DC | PRN

## 2017-10-23 MED ORDER — CLARITHROMYCIN ER 500 MG PO TB24: 1000.0000 mg | ORAL_TABLET | Freq: Every day | ORAL | 0 refills | Status: DC

## 2017-10-23 NOTE — Patient Instructions (Addendum)
Drink plenty of fluids to stay well-hydrated  Continue using the Symbicort on a daily basis  When you are wheezing or coughing badly you can use the albuterol inhaler 2 inhalations every 4-6 hours  Take prednisone 20 mg 3 pills daily for 2 days, then 2 daily for 2 days, then 1 daily for 2 days.  Since we are giving you an injection today wait until tomorrow morning after breakfast to begin these.  (We are giving an injection of Solu-Medrol 75 mg today.)  Take the benzonatate cough pills 1 or 2 pills 3 times daily as needed for cough  In addition to this you can take some over-the-counter cough suppressant such as Delsym if needed  I am placing you on clarithromycin (Biaxin) 500 mg 1 twice daily with food for antibiotic for infection.  This is good for many types of the bacteria that treat lung infections including the so-called atypical infections which hang on for a long time.  It can cause some nausea, and is best to take it with some food at breakfast and supper.  Do not take the simvastatin or nortriptyline while you are on the clarithromycin, then resume.  If you develop sudden increased shortness of breath or or just not getting better go to the emergency room or come back for a recheck.    IF you received an x-ray today, you will receive an invoice from [facility]. Please contact [facility] at [phone] with questions or concerns regarding your invoice.   IF you received labwork today, you will receive an invoice from [facility]. Please contact [facility] at [phone] with questions or concerns regarding your invoice.   Our billing staff will not be able to assist you with questions regarding bills from these companies.  You will be contacted with the lab results as soon as they are available. The fastest way to get your results is to activate your My Chart account. Instructions are located on the last page of this paperwork. If you have not heard from [place]  regarding the results in 2 weeks, please contact this office.

## 2017-10-23 NOTE — Progress Notes (Signed)
Patient ID: Scott Oneal, male    DOB: 1941-06-09  Age: 76 y.o. MRN: [id number]  Chief Complaint  Patient presents with  . Cough    dry/ x 1wk    Subjective:   76 year old man with a cough.  He went to his doctor on 7 December with a respiratory tract infection.  Was given some cough syrup which he did not tolerate.  He has persisted in having a nonstop cough.  He does not smoke.  He is wheezing.  He coughs so much that his chest aches.  He did have his flu shot this year  Current allergies, medications, problem list, past/family and social histories reviewed.  Objective:  BP (!) 152/91   Pulse 100   Temp 97.7 F (36.5 C) (Oral)   Resp 16   Ht 6' (1.829 m)   Wt 220 lb (99.8 kg)   SpO2 100%   BMI 29.84 kg/m   Rubbing his chest as he coughs.  His cough sounds like it squeezing.  His TMs are normal.  Throat clear.  Neck supple without significant nodes.  He does have a little bit of runny nose.  Chest has wheezing throughout both lungs, but more at the left lower lung.  Heart regular without murmurs.  No rhonchi or rales.  Assessment & Plan:   Assessment: 1. Cough   2. Moderate persistent asthmatic bronchitis with acute exacerbation   3. Acute bronchitis, unspecified organism       Plan: See insturctions  Orders Placed This Encounter  Procedures  . DG Chest 2 View    Standing Status:   Future    Number of Occurrences:   1    Standing Expiration Date:   10/23/2018    Order Specific Question:   Reason for Exam (SYMPTOM  OR DIAGNOSIS REQUIRED)    Answer:   cough, wheeze, most wheeze lll    Order Specific Question:   Preferred imaging location?    Answer:   External  . POCT CBC    No orders of the defined types were placed in this encounter.        Patient Instructions   Drink plenty of fluids to stay well-hydrated  Continue using the Symbicort on a daily basis  When you are wheezing or coughing badly you can use the albuterol inhaler 2 inhalations every  4-6 hours  Take prednisone 20 mg 3 pills daily for 2 days, then 2 daily for 2 days, then 1 daily for 2 days.  Since we are giving you an injection today wait until tomorrow morning after breakfast to begin these.  (We are giving an injection of Solu-Medrol 125 mg today.)  Take the benzonatate cough pills 1 or 2 pills 3 times daily as needed for cough  In addition to this you can take some over-the-counter cough suppressant such as Delsym if needed  I am placing you on clarithromycin (Biaxin) 500 mg 1 twice daily with food for antibiotic for infection.  This is good for many types of the bacteria that treat lung infections including the so-called atypical infections which hang on for a long time.  It can cause some nausea, and is best to take it with some food at breakfast and supper.  Do not take the simvastatin or nortriptyline while you are on the clarithromycin, then resume.  If you develop sudden increased shortness of breath or or just not getting better go to the emergency room or come back for a recheck.  IF you received an x-ray today, you will receive an invoice from [facility]. Please contact [facility] at [phone] with questions or concerns regarding your invoice.   IF you received labwork today, you will receive an invoice from [facility]. Please contact [facility] at [phone] with questions or concerns regarding your invoice.   Our billing staff will not be able to assist you with questions regarding bills from these companies.  You will be contacted with the lab results as soon as they are available. The fastest way to get your results is to activate your My Chart account. Instructions are located on the last page of this paperwork. If you have not heard from [place] regarding the results in 2 weeks, please contact this office.         No Follow-up on file.   Masami Plata, MD 10/23/2017

## 2017-10-28 ENCOUNTER — Other Ambulatory Visit: Payer: Self-pay | Admitting: Internal Medicine

## 2017-10-30 ENCOUNTER — Ambulatory Visit: Payer: Self-pay | Admitting: *Deleted

## 2017-10-30 NOTE — Telephone Encounter (Signed)
Pt had cold symptoms and started taking clarithromycin and benzonatate on Christmas Eve. The next day he felt a little unsteady and fell last night trying to go down some stairs. He feels like the unsteadiness is from taking these medications.  He has stopped taking them.  Appointment made for Wednesday.   Reason for Disposition . Caller has URGENT medication question about med that PCP prescribed and triager unable to answer question  Answer Assessment - Initial Assessment Questions 1. SYMPTOMS: "Do you have any symptoms?"     The next day after starting the antibiotics and cough meds 2. SEVERITY: If symptoms are present, ask "Are they mild, moderate or severe?"     Severe  Protocols used: MEDICATION QUESTION CALL-A-AH

## 2017-11-01 ENCOUNTER — Ambulatory Visit (INDEPENDENT_AMBULATORY_CARE_PROVIDER_SITE_OTHER): Payer: PPO | Admitting: Family

## 2017-11-01 ENCOUNTER — Other Ambulatory Visit (INDEPENDENT_AMBULATORY_CARE_PROVIDER_SITE_OTHER): Payer: PPO

## 2017-11-01 ENCOUNTER — Encounter: Payer: Self-pay | Admitting: Family

## 2017-11-01 VITALS — BP 138/78 | HR 71 | Temp 97.7°F | Wt 226.0 lb

## 2017-11-01 DIAGNOSIS — R059 Cough, unspecified: Secondary | ICD-10-CM

## 2017-11-01 DIAGNOSIS — R05 Cough: Secondary | ICD-10-CM

## 2017-11-01 DIAGNOSIS — R066 Hiccough: Secondary | ICD-10-CM

## 2017-11-01 DIAGNOSIS — D72829 Elevated white blood cell count, unspecified: Secondary | ICD-10-CM

## 2017-11-01 LAB — CBC WITH DIFFERENTIAL/PLATELET
BASOS ABS: 0.1 10*3/uL (ref 0.0–0.1)
Basophils Relative: 0.9 % (ref 0.0–3.0)
Eosinophils Absolute: 0.2 10*3/uL (ref 0.0–0.7)
Eosinophils Relative: 1.8 % (ref 0.0–5.0)
HCT: 50.9 % (ref 39.0–52.0)
Hemoglobin: 16.8 g/dL (ref 13.0–17.0)
LYMPHS ABS: 1.2 10*3/uL (ref 0.7–4.0)
LYMPHS PCT: 13.4 % (ref 12.0–46.0)
MCHC: 33 g/dL (ref 30.0–36.0)
MCV: 95.3 fl (ref 78.0–100.0)
MONOS PCT: 6.9 % (ref 3.0–12.0)
Monocytes Absolute: 0.6 10*3/uL (ref 0.1–1.0)
NEUTROS PCT: 77 % (ref 43.0–77.0)
Neutro Abs: 6.9 10*3/uL (ref 1.4–7.7)
Platelets: 123 10*3/uL — ABNORMAL LOW (ref 150.0–400.0)
RBC: 5.34 Mil/uL (ref 4.22–5.81)
RDW: 15.3 % (ref 11.5–15.5)
WBC: 9 10*3/uL (ref 4.0–10.5)

## 2017-11-01 MED ORDER — NYSTATIN 100000 UNIT/ML MT SUSP
5.0000 mL | Freq: Four times a day (QID) | OROMUCOSAL | 0 refills | Status: DC
Start: 2017-11-01 — End: 2018-01-24

## 2017-11-01 MED ORDER — HYDROCODONE-HOMATROPINE 5-1.5 MG/5ML PO SYRP: 5.0000 mL | ORAL_SOLUTION | Freq: Four times a day (QID) | ORAL | 0 refills | Status: DC | PRN

## 2017-11-01 MED ORDER — CHLORPROMAZINE HCL 25 MG PO TABS: 25.0000 mg | ORAL_TABLET | Freq: Three times a day (TID) | ORAL | 0 refills | Status: DC | PRN

## 2017-11-01 NOTE — Progress Notes (Signed)
Scott Oneal is a 77 y.o. male with the following history as recorded in EpicCare:  Patient Active Problem List   Diagnosis Date Noted  . Cough 09/15/2017  . Bursitis of hip, right 07/18/2017  . Ischial bursitis 05/17/2017  . Asthmatic bronchitis 05/17/2017  . Elbow pain, left 11/21/2016  . Irregular heart beat 09/27/2016  . CRF (chronic renal failure), stage 3 (moderate) ([place]) 08/22/2016  . Hiccups 07/14/2016  . Edema 06/06/2016  . Insomnia 06/06/2016  . Thigh pain 03/15/2016  . Tinnitus 09/15/2015  . Cramps of lower extremity 09/15/2015  . UTI (urinary tract infection) 06/15/2015  . Umbilical hernia 76/81/1572  . Lower GI bleed 11/26/2014  . Rectal bleeding 11/25/2014  . History of colonic polyps 09/12/2014  . Left foot pain 08/11/2014  . Right foot pain 06/10/2014  . Oral candidiasis 05/15/2014  . Rash 04/30/2014  . Low back pain 01/29/2014  . Anxiety disorder 11/27/2013  . Arthralgia 09/30/2013  . Preop exam for internal medicine 06/19/2013  . Preoperative clearance 06/19/2013  . Urinary frequency 12/12/2012  . Actinic keratoses 12/12/2012  . Thrombocytopenia ([place]) 11/19/2012  . Well adult exam 09/10/2012  . Shoulder pain, left 09/10/2012  . Warts 09/10/2012  . Headache(784.0) 11/29/2011  . Hyperkalemia 08/01/2011  . Neuropathy of hand 08/01/2011  . Numbness 04/28/2011  . Fatigue 04/28/2011  . Cellulitis of trunk 04/21/2011  . Chalazion 07/07/2010  . Disorder resulting from impaired renal function 03/10/2010  . Neoplasm of uncertain behavior of skin 11/09/2009  . PSA, INCREASED 11/09/2009  . HYPERCHOLESTEROLEMIA 08/31/2009  . Hypogonadism in male 07/03/2008  . BRADYCARDIA 07/03/2008  . PERSONAL HX COLONIC POLYPS 07/02/2008  . OSTEOPOROSIS 06/03/2008  . CHOLELITHIASIS 04/29/2008  . CALCU GALLBLADD W/O MENTION CHOLECYST/OBST 04/29/2008  . Celiac disease 04/29/2008  . WEIGHT LOSS 04/08/2008  . GERD (gastroesophageal reflux disease) 04/02/2008  .  TUBULOVILLOUS ADENOMA, COLON 04/01/2008  . Moderate persistent asthma without complication 62/12/5595  . Diarrhea 01/31/2008  . Hypothyroidism 08/11/2007  . Gout 08/11/2007  . Essential hypertension 08/11/2007  . MYOCARDIAL INFARCTION, HX OF 08/11/2007  . Coronary atherosclerosis 08/11/2007    Current Outpatient Medications  Medication Sig Dispense Refill  . albuterol (PROAIR HFA) 108 (90 Base) MCG/ACT inhaler Inhale 2 puffs into the lungs every 6 (six) hours as needed for wheezing or shortness of breath. 3 Inhaler 3  . amLODipine (NORVASC) 5 MG tablet TAKE 1 TABLET BY MOUTH EVERY DAY 90 tablet 3  . aspirin 81 MG tablet Take 81 mg by mouth every evening.     . B-D 3CC LUER-LOK SYR 22GX1" 22G X 1" 3 ML MISC USE EVERY 14 DAYS AS DIRECTED 34 each 2  . budesonide-formoterol (SYMBICORT) 160-4.5 MCG/ACT inhaler Inhale 2 puffs into the lungs 2 (two) times daily. 1 Inhaler 11  . calcitRIOL (ROCALTROL) 0.25 MCG capsule TAKE 1 CAPSULE BY MOUTH DAILY. ANNUAL APPT DUE IN OCT MUST SEE MD FOR REFILLS 90 capsule 1  . carvedilol (COREG) 25 MG tablet TAKE 1 TABLET BY MOUTH TWICE A DAY WITH MEALS 180 tablet 1  . Cholecalciferol 2000 UNITS TABS Take 2,000 Units by mouth daily.     . clonazePAM (KLONOPIN) 0.25 MG disintegrating tablet Take 1 tablet (0.25 mg total) by mouth 2 (two) times daily as needed (anxiety). 60 tablet 1  . diclofenac sodium (VOLTAREN) 1 % GEL Apply 4 g topically 4 (four) times daily. 200 g 5  . levothyroxine (SYNTHROID, LEVOTHROID) 150 MCG tablet Take 1 tablet (150 mcg total) by mouth  daily. 90 tablet 1  . Multiple Vitamin (MULTIVITAMIN) capsule Take 1 capsule by mouth daily.    . nitroGLYCERIN (NITROSTAT) 0.4 MG SL tablet Place 0.4 mg under the tongue every 5 (five) minutes as needed for chest pain.    . nortriptyline (PAMELOR) 25 MG capsule Take 1-2 capsules (25-50 mg total) by mouth at bedtime. 60 capsule 5  . olmesartan (BENICAR) 20 MG tablet Take 1 tablet (20 mg total) by mouth daily.  90 tablet 3  . sildenafil (VIAGRA) 100 MG tablet Take 100 mg by mouth daily as needed for erectile dysfunction.     . simvastatin (ZOCOR) 10 MG tablet Take 10 mg by mouth daily.    Marland Kitchen testosterone cypionate (DEPOTESTOSTERONE CYPIONATE) 200 MG/ML injection INJECT 2 ML INTO THE MUSCLE EVERY 2 WEEKS 12 mL 1  . chlorproMAZINE (THORAZINE) 25 MG tablet Take 1 tablet (25 mg total) by mouth 3 (three) times daily as needed. 30 tablet 0  . HYDROcodone-homatropine (HYCODAN) 5-1.5 MG/5ML syrup Take 5 mLs by mouth every 6 (six) hours as needed for cough. 180 mL 0  . nystatin (MYCOSTATIN) 100000 UNIT/ML suspension Take 5 mLs (500,000 Units total) by mouth 4 (four) times daily. 60 mL 0   No current facility-administered medications for this visit.     Allergies: Benzalkonium chloride; Biaxin [clarithromycin]; Allantoin-pramoxine; and Metoprolol tartrate  Past Medical History:  Diagnosis Date  . Allergy   . Anxiety   . Arthritis   . Asthma   . Benign neoplasm of colon   . Calculus of gallbladder without mention of cholecystitis   . Cataract    bilateral cateracts removed  . Celiac disease   . Cholelithiasis   . Coronary atherosclerosis of unspecified type of vessel, native or graft   . Diarrhea   . Esophageal reflux   . Glaucoma   . Gout, unspecified   . Hyperparathyroidism   . Long term (current) use of anticoagulants   . Loss of weight   . Old myocardial infarction   . Osteoporosis, unspecified   . Other malaise and fatigue   . Other specified cardiac dysrhythmias(427.89)   . Other testicular hypofunction   . Personal history of colonic polyps   . Pure hypercholesterolemia   . Renal insufficiency    chronic  . Unspecified asthma(493.90)   . Unspecified essential hypertension   . Unspecified hypothyroidism     Past Surgical History:  Procedure Laterality Date  . COLON SURGERY    . COLONOSCOPY    . COLONOSCOPY WITH PROPOFOL N/A 11/27/2014   Procedure: COLONOSCOPY WITH PROPOFOL;   Surgeon: Milus Banister, MD;  Location: [facility];  Service: Endoscopy;  Laterality: N/A;  . CORONARY ANGIOPLASTY    . CORONARY STENT PLACEMENT  2011   Cypher; in distal circumflex artery  . CYSTOSCOPY  1998  . EYE SURGERY    . KNEE SURGERY     right  . SHOULDER SURGERY Left 9/14   Dr Shara Blazing  . UPPER GASTROINTESTINAL ENDOSCOPY    . VASCULAR SURGERY      Family History  Problem Relation Age of Onset  . Lymphoma Father   . Cancer Father        lymphoma  . Hypertension Unknown   . Vision loss Maternal Grandmother   . Colon cancer Son   . Asthma Neg Hx   . Esophageal cancer Neg Hx   . Rectal cancer Neg Hx   . Stomach cancer Neg Hx     Social History  Tobacco Use  . Smoking status: Never Smoker  . Smokeless tobacco: Never Used  Substance Use Topics  . Alcohol use: No    Subjective:  Patient first seen with cough/ congestion here on November 16- symptoms had been present for 3-4 days before seen at that appointment; was given Levaquin 250 mg qd x 10 days- is not sure if he got any relief- symptoms persisted; was then seen on 12/24 by Dr. Linna Darner with same symptoms; had another CXR done which was normal; was started on Biaxin XL 1000 mg qd, Prednisone and Tessalon Perles; had to stop the Biaxin due to side effects- notes that he just feel "off balance" and could not tolerate; actually fell twice while on the Biaxin; now presents with concerns for persisting hiccups and cough; has not run any type of fever since November but does hear himself still wheezing; notes that has had problems with hiccups and persisting cough in the past- wonders about getting refills on the medications he used in the past for these symptoms- thorazine and Hycodan cough syrup.  Also mentions that his throat "feels swollen" and is concerned he has thrush; admits he does not remember to "rinse and spit" after using his Symbicort daily;   Objective:  Vitals:   11/01/17 0844  BP: 138/78  Pulse: 71  Temp:  97.7 F (36.5 C)  SpO2: 98%  Weight: 226 lb (102.5 kg)    General: Well developed, well nourished, in no acute distress  Skin : Warm and dry.  Head: Normocephalic and atraumatic  Eyes: Sclera and conjunctiva clear; pupils round and reactive to light; extraocular movements intact  Ears: External normal; canals clear; tympanic membranes normal  Oropharynx: Pink, supple. No suspicious lesions  Neck: Supple without thyromegaly, adenopathy  Lungs: Respirations unlabored; clear to auscultation bilaterally without wheeze, rales, rhonchi  CVS exam: normal rate and regular rhythm.  Neurologic: Alert and oriented; speech intact; face symmetrical; moves all extremities well; CNII-XII intact without focal deficit  Assessment:  1. Leukocytosis, unspecified type   2. Cough   3. Hiccups     Plan:  Discussed that would prefer not to use another antibiotic at this time- am suspicious that symptoms are more related to lingering inflammation/ bronchospasm; will re-check CBC and as long as stable will not start a 3rd antibiotic; refill is given on Thorazine and Hycodan which have been beneficial in the past; encouraged to take his Symbicort daily as prescribed and rinse and spit; Rx given for Nystatin to help with current thrush; recommend to follow-up with his PCP in about 2 weeks for recheck; I did update referral to pulmonology per patient request as well.  No Follow-up on file.  Orders Placed This Encounter  Procedures  . CBC w/Diff    Standing Status:   Future    Number of Occurrences:   1    Standing Expiration Date:   11/01/2018  . Ambulatory referral to Pulmonology    Referral Priority:   Routine    Referral Type:   Consultation    Referral Reason:   Specialty Services Required    Requested Specialty:   Pulmonary Disease    Number of Visits Requested:   1    Requested Prescriptions   Signed Prescriptions Disp Refills  . HYDROcodone-homatropine (HYCODAN) 5-1.5 MG/5ML syrup 180 mL 0    Sig:  Take 5 mLs by mouth every 6 (six) hours as needed for cough.  . nystatin (MYCOSTATIN) 100000 UNIT/ML suspension 60 mL 0  Sig: Take 5 mLs (500,000 Units total) by mouth 4 (four) times daily.  . chlorproMAZINE (THORAZINE) 25 MG tablet 30 tablet 0    Sig: Take 1 tablet (25 mg total) by mouth 3 (three) times daily as needed.

## 2017-11-09 ENCOUNTER — Encounter: Payer: Self-pay | Admitting: Internal Medicine

## 2017-11-09 ENCOUNTER — Ambulatory Visit (INDEPENDENT_AMBULATORY_CARE_PROVIDER_SITE_OTHER): Payer: PPO | Admitting: Internal Medicine

## 2017-11-09 DIAGNOSIS — E034 Atrophy of thyroid (acquired): Secondary | ICD-10-CM

## 2017-11-09 DIAGNOSIS — J4520 Mild intermittent asthma, uncomplicated: Secondary | ICD-10-CM | POA: Diagnosis not present

## 2017-11-09 DIAGNOSIS — N183 Chronic kidney disease, stage 3 unspecified: Secondary | ICD-10-CM

## 2017-11-09 DIAGNOSIS — K900 Celiac disease: Secondary | ICD-10-CM | POA: Diagnosis not present

## 2017-11-09 DIAGNOSIS — B370 Candidal stomatitis: Secondary | ICD-10-CM

## 2017-11-09 NOTE — Assessment & Plan Note (Signed)
Gluten free diet

## 2017-11-09 NOTE — Assessment & Plan Note (Signed)
Levothroid

## 2017-11-09 NOTE — Assessment & Plan Note (Signed)
F/u w/Dr Clover Mealy

## 2017-11-09 NOTE — Patient Instructions (Addendum)
Use [employer] Peroxicare tooth paste  Careful with hot spicy foods

## 2017-11-09 NOTE — Progress Notes (Signed)
Subjective:  Patient ID: Scott Oneal, male    DOB: [birth year]  Age: 77 y.o. MRN: [id number]  CC: No chief complaint on file.   HPI TAKUMI DIN presents for recent bronchitis. He was falling on Biaxin - stopped.  F/u hiccups - cleared w/Thorazine F/u thrush  Outpatient Medications Prior to Visit  Medication Sig Dispense Refill  . albuterol (PROAIR HFA) 108 (90 Base) MCG/ACT inhaler Inhale 2 puffs into the lungs every 6 (six) hours as needed for wheezing or shortness of breath. 3 Inhaler 3  . amLODipine (NORVASC) 5 MG tablet TAKE 1 TABLET BY MOUTH EVERY DAY 90 tablet 3  . aspirin 81 MG tablet Take 81 mg by mouth every evening.     . B-D 3CC LUER-LOK SYR 22GX1" 22G X 1" 3 ML MISC USE EVERY 14 DAYS AS DIRECTED 34 each 2  . budesonide-formoterol (SYMBICORT) 160-4.5 MCG/ACT inhaler Inhale 2 puffs into the lungs 2 (two) times daily. 1 Inhaler 11  . calcitRIOL (ROCALTROL) 0.25 MCG capsule TAKE 1 CAPSULE BY MOUTH DAILY. ANNUAL APPT DUE IN OCT MUST SEE MD FOR REFILLS 90 capsule 1  . carvedilol (COREG) 25 MG tablet TAKE 1 TABLET BY MOUTH TWICE A DAY WITH MEALS 180 tablet 1  . chlorproMAZINE (THORAZINE) 25 MG tablet Take 1 tablet (25 mg total) by mouth 3 (three) times daily as needed. 30 tablet 0  . Cholecalciferol 2000 UNITS TABS Take 2,000 Units by mouth daily.     . clonazePAM (KLONOPIN) 0.25 MG disintegrating tablet Take 1 tablet (0.25 mg total) by mouth 2 (two) times daily as needed (anxiety). 60 tablet 1  . diclofenac sodium (VOLTAREN) 1 % GEL Apply 4 g topically 4 (four) times daily. 200 g 5  . HYDROcodone-homatropine (HYCODAN) 5-1.5 MG/5ML syrup Take 5 mLs by mouth every 6 (six) hours as needed for cough. 180 mL 0  . levothyroxine (SYNTHROID, LEVOTHROID) 150 MCG tablet Take 1 tablet (150 mcg total) by mouth daily. 90 tablet 1  . Multiple Vitamin (MULTIVITAMIN) capsule Take 1 capsule by mouth daily.    . nitroGLYCERIN (NITROSTAT) 0.4 MG SL tablet Place 0.4 mg under the tongue every 5  (five) minutes as needed for chest pain.    . nortriptyline (PAMELOR) 25 MG capsule Take 1-2 capsules (25-50 mg total) by mouth at bedtime. 60 capsule 5  . nystatin (MYCOSTATIN) 100000 UNIT/ML suspension Take 5 mLs (500,000 Units total) by mouth 4 (four) times daily. 60 mL 0  . olmesartan (BENICAR) 20 MG tablet Take 1 tablet (20 mg total) by mouth daily. 90 tablet 3  . sildenafil (VIAGRA) 100 MG tablet Take 100 mg by mouth daily as needed for erectile dysfunction.     . simvastatin (ZOCOR) 10 MG tablet Take 10 mg by mouth daily.    Marland Kitchen testosterone cypionate (DEPOTESTOSTERONE CYPIONATE) 200 MG/ML injection INJECT 2ML INTO MUSCLE EVERY 2 WEEKS 12 mL 3   No facility-administered medications prior to visit.     ROS Review of Systems  Constitutional: Negative for appetite change, fatigue and unexpected weight change.  HENT: Negative for congestion, nosebleeds, sneezing, sore throat and trouble swallowing.   Eyes: Negative for itching and visual disturbance.  Respiratory: Negative for cough.   Cardiovascular: Negative for chest pain, palpitations and leg swelling.  Gastrointestinal: Negative for abdominal distention, blood in stool, diarrhea and nausea.  Genitourinary: Negative for frequency and hematuria.  Musculoskeletal: Positive for gait problem. Negative for back pain, joint swelling and neck pain.  Skin: Negative for  rash.  Neurological: Negative for dizziness, tremors, speech difficulty and weakness.  Psychiatric/Behavioral: Negative for agitation, dysphoric mood and sleep disturbance. The patient is not nervous/anxious.     Objective:  BP 128/76 (BP Location: Left Arm, Patient Position: Sitting, Cuff Size: Large)   Pulse 68   Temp 98.5 F (36.9 C) (Oral)   Ht 6' (1.829 m)   Wt 222 lb (100.7 kg)   SpO2 98%   BMI 30.11 kg/m   BP Readings from Last 3 Encounters:  11/09/17 128/76  11/01/17 138/78  10/23/17 (!) 152/91    Wt Readings from Last 3 Encounters:  11/09/17 222 lb  (100.7 kg)  11/01/17 226 lb (102.5 kg)  10/23/17 220 lb (99.8 kg)    Physical Exam  Constitutional: He is oriented to person, place, and time. He appears well-developed. No distress.  NAD  HENT:  Mouth/Throat: Oropharynx is clear and moist.  Eyes: Conjunctivae are normal. Pupils are equal, round, and reactive to light.  Neck: Normal range of motion. No JVD present. No thyromegaly present.  Cardiovascular: Normal rate, regular rhythm, normal heart sounds and intact distal pulses. Exam reveals no gallop and no friction rub.  No murmur heard. Pulmonary/Chest: Effort normal and breath sounds normal. No respiratory distress. He has no wheezes. He has no rales. He exhibits no tenderness.  Abdominal: Soft. Bowel sounds are normal. He exhibits no distension and no mass. There is no tenderness. There is no rebound and no guarding.  Musculoskeletal: Normal range of motion. He exhibits no edema or tenderness.  Lymphadenopathy:    He has no cervical adenopathy.  Neurological: He is alert and oriented to person, place, and time. He has normal reflexes. No cranial nerve deficit. He exhibits normal muscle tone. He displays a negative Romberg sign. Coordination and gait normal.  Skin: Skin is warm and dry. No rash noted.  Psychiatric: He has a normal mood and affect. His behavior is normal. Judgment and thought content normal.  no thrush  Lab Results  Component Value Date   WBC 9.0 11/01/2017   HGB 16.8 11/01/2017   HCT 50.9 11/01/2017   PLT 123.0 (L) 11/01/2017   GLUCOSE 89 05/18/2017   CHOL 118 05/18/2017   TRIG 82.0 05/18/2017   HDL 40.60 05/18/2017   LDLCALC 61 05/18/2017   ALT 17 05/18/2017   AST 21 05/18/2017   NA 141 05/18/2017   K 4.6 05/18/2017   CL 105 05/18/2017   CREATININE 1.89 (H) 05/18/2017   BUN 16 05/18/2017   CO2 31 05/18/2017   TSH 3.13 05/18/2017   PSA 2.91 05/18/2017   INR 1.20 11/26/2014   HGBA1C 5.9 03/15/2016   MICROALBUR 1.2 05/18/2017    Dg Chest 2  View  Result Date: 09/15/2017 CLINICAL DATA:  Productive cough, shortness of breath and RIGHT chest pain for 5 days. EXAM: CHEST  2 VIEW COMPARISON:  Chest radiograph July 10, 2016 FINDINGS: Cardiac silhouette is mildly enlarged. Mediastinal silhouette is nonsuspicious. Trace calcific atherosclerosis aortic arch. Persistently elevated LEFT hemidiaphragm. No pleural effusion or focal consolidation. No pneumothorax. Mild degenerative change of the spine. Soft tissue planes are nonsuspicious. IMPRESSION: Mild cardiomegaly.  No acute pulmonary process. Aortic Atherosclerosis (ICD10-I70.0). Electronically Signed   By: Elon Alas M.D.   On: 09/15/2017 13:55    Assessment & Plan:   There are no diagnoses linked to this encounter. I am having Arlice Colt "[facility]" maintain his sildenafil, aspirin, Cholecalciferol, B-D 3CC LUER-LOK SYR 22GX1", amLODipine, olmesartan, diclofenac sodium, nitroGLYCERIN, simvastatin,  nortriptyline, albuterol, clonazePAM, multivitamin, carvedilol, levothyroxine, budesonide-formoterol, calcitRIOL, testosterone cypionate, HYDROcodone-homatropine, nystatin, and chlorproMAZINE.  No orders of the defined types were placed in this encounter.    Follow-up: No Follow-up on file.  Walker Kehr, MD

## 2017-11-09 NOTE — Assessment & Plan Note (Signed)
Better  

## 2017-11-09 NOTE — Assessment & Plan Note (Signed)
Use [employer] Peroxicare tooth paste

## 2017-11-20 ENCOUNTER — Encounter: Payer: Self-pay | Admitting: Internal Medicine

## 2017-11-29 ENCOUNTER — Other Ambulatory Visit (INDEPENDENT_AMBULATORY_CARE_PROVIDER_SITE_OTHER): Payer: PPO

## 2017-11-29 ENCOUNTER — Ambulatory Visit (INDEPENDENT_AMBULATORY_CARE_PROVIDER_SITE_OTHER)
Admission: RE | Admit: 2017-11-29 | Discharge: 2017-11-29 | Disposition: A | Discharge status: Home / Self Care | Payer: PPO | Source: Ambulatory Visit | Attending: Internal Medicine | Admitting: Internal Medicine

## 2017-11-29 ENCOUNTER — Ambulatory Visit (INDEPENDENT_AMBULATORY_CARE_PROVIDER_SITE_OTHER): Payer: PPO | Admitting: Internal Medicine

## 2017-11-29 ENCOUNTER — Encounter: Payer: Self-pay | Admitting: Internal Medicine

## 2017-11-29 VITALS — BP 134/72 | HR 61 | Temp 98.6°F | Ht 72.0 in | Wt 225.0 lb

## 2017-11-29 DIAGNOSIS — R609 Edema, unspecified: Secondary | ICD-10-CM | POA: Diagnosis not present

## 2017-11-29 DIAGNOSIS — S3993XA Unspecified injury of pelvis, initial encounter: Secondary | ICD-10-CM | POA: Diagnosis not present

## 2017-11-29 DIAGNOSIS — M7918 Myalgia, other site: Secondary | ICD-10-CM

## 2017-11-29 DIAGNOSIS — I10 Essential (primary) hypertension: Secondary | ICD-10-CM

## 2017-11-29 LAB — BASIC METABOLIC PANEL
BUN: 17 mg/dL (ref 6–23)
CO2: 32 mEq/L (ref 19–32)
CREATININE: 2.02 mg/dL — AB (ref 0.40–1.50)
Calcium: 9.8 mg/dL (ref 8.4–10.5)
Chloride: 104 mEq/L (ref 96–112)
GFR: 34.29 mL/min — AB (ref >60.00)
Glucose, Bld: 101 mg/dL — ABNORMAL HIGH (ref 70–99)
POTASSIUM: 4.7 meq/L (ref 3.5–5.1)
Sodium: 141 mEq/L (ref 135–145)

## 2017-11-29 LAB — TSH: TSH: 4.28 u[IU]/mL (ref 0.35–4.50)

## 2017-11-29 MED ORDER — TRAMADOL HCL 50 MG PO TABS: 50.0000 mg | ORAL_TABLET | Freq: Four times a day (QID) | ORAL | 0 refills | Status: DC | PRN

## 2017-11-29 MED ORDER — FUROSEMIDE 40 MG PO TABS: 40.0000 mg | ORAL_TABLET | Freq: Every day | ORAL | 0 refills | Status: DC | PRN

## 2017-11-29 NOTE — Assessment & Plan Note (Signed)
Worse due to ankle sprain and Advil use Stop Advil Elevate legs

## 2017-11-29 NOTE — Progress Notes (Signed)
Subjective:  Patient ID: Scott Oneal, male    DOB: [birth year]  Age: 77 y.o. MRN: [id number]  CC: No chief complaint on file.   HPI Scott Oneal presents for severe pain in the pain in B sitting bones - he fell on ice a week ago - hit his back, head. Denies LOC. L knee went underneath - scabbed L knee. Taking Advil 6 a day and Tramadol 3 a day to control pain. C/o B LE edema - new  Outpatient Medications Prior to Visit  Medication Sig Dispense Refill  . albuterol (PROAIR HFA) 108 (90 Base) MCG/ACT inhaler Inhale 2 puffs into the lungs every 6 (six) hours as needed for wheezing or shortness of breath. 3 Inhaler 3  . amLODipine (NORVASC) 5 MG tablet TAKE 1 TABLET BY MOUTH EVERY DAY 90 tablet 3  . aspirin 81 MG tablet Take 81 mg by mouth every evening.     . B-D 3CC LUER-LOK SYR 22GX1" 22G X 1" 3 ML MISC USE EVERY 14 DAYS AS DIRECTED 34 each 2  . budesonide-formoterol (SYMBICORT) 160-4.5 MCG/ACT inhaler Inhale 2 puffs into the lungs 2 (two) times daily. 1 Inhaler 11  . calcitRIOL (ROCALTROL) 0.25 MCG capsule TAKE 1 CAPSULE BY MOUTH DAILY. ANNUAL APPT DUE IN OCT MUST SEE MD FOR REFILLS 90 capsule 1  . carvedilol (COREG) 25 MG tablet TAKE 1 TABLET BY MOUTH TWICE A DAY WITH MEALS 180 tablet 1  . chlorproMAZINE (THORAZINE) 25 MG tablet Take 1 tablet (25 mg total) by mouth 3 (three) times daily as needed. 30 tablet 0  . Cholecalciferol 2000 UNITS TABS Take 2,000 Units by mouth daily.     . clonazePAM (KLONOPIN) 0.25 MG disintegrating tablet Take 1 tablet (0.25 mg total) by mouth 2 (two) times daily as needed (anxiety). 60 tablet 1  . diclofenac sodium (VOLTAREN) 1 % GEL Apply 4 g topically 4 (four) times daily. 200 g 5  . HYDROcodone-homatropine (HYCODAN) 5-1.5 MG/5ML syrup Take 5 mLs by mouth every 6 (six) hours as needed for cough. 180 mL 0  . levothyroxine (SYNTHROID, LEVOTHROID) 150 MCG tablet Take 1 tablet (150 mcg total) by mouth daily. 90 tablet 1  . Multiple Vitamin (MULTIVITAMIN)  capsule Take 1 capsule by mouth daily.    . nitroGLYCERIN (NITROSTAT) 0.4 MG SL tablet Place 0.4 mg under the tongue every 5 (five) minutes as needed for chest pain.    . nortriptyline (PAMELOR) 25 MG capsule Take 1-2 capsules (25-50 mg total) by mouth at bedtime. 60 capsule 5  . nystatin (MYCOSTATIN) 100000 UNIT/ML suspension Take 5 mLs (500,000 Units total) by mouth 4 (four) times daily. 60 mL 0  . olmesartan (BENICAR) 20 MG tablet Take 1 tablet (20 mg total) by mouth daily. 90 tablet 3  . sildenafil (VIAGRA) 100 MG tablet Take 100 mg by mouth daily as needed for erectile dysfunction.     . simvastatin (ZOCOR) 10 MG tablet Take 10 mg by mouth daily.    Marland Kitchen testosterone cypionate (DEPOTESTOSTERONE CYPIONATE) 200 MG/ML injection INJECT 2ML INTO MUSCLE EVERY 2 WEEKS 12 mL 3   No facility-administered medications prior to visit.     ROS Review of Systems  Constitutional: Negative for appetite change, fatigue and unexpected weight change.  HENT: Negative for congestion, nosebleeds, sneezing, sore throat and trouble swallowing.   Eyes: Negative for itching and visual disturbance.  Respiratory: Negative for cough.   Cardiovascular: Positive for leg swelling. Negative for chest pain and palpitations.  Gastrointestinal:  Negative for abdominal distention, blood in stool, diarrhea and nausea.  Genitourinary: Negative for frequency and hematuria.  Musculoskeletal: Positive for arthralgias, back pain and gait problem. Negative for joint swelling and neck pain.  Skin: Negative for rash.  Neurological: Negative for dizziness, tremors, speech difficulty, weakness and headaches.  Psychiatric/Behavioral: Negative for agitation, dysphoric mood and sleep disturbance. The patient is not nervous/anxious.     Objective:  BP 134/72 (BP Location: Left Arm, Patient Position: Sitting, Cuff Size: Large)   Pulse 61   Temp 98.6 F (37 C) (Oral)   Ht 6' (1.829 m)   Wt 225 lb (102.1 kg)   SpO2 99%   BMI 30.52  kg/m   BP Readings from Last 3 Encounters:  11/29/17 134/72  11/09/17 128/76  11/01/17 138/78    Wt Readings from Last 3 Encounters:  11/29/17 225 lb (102.1 kg)  11/09/17 222 lb (100.7 kg)  11/01/17 226 lb (102.5 kg)    Physical Exam  Constitutional: He is oriented to person, place, and time. He appears well-developed. No distress.  NAD  HENT:  Mouth/Throat: Oropharynx is clear and moist.  Eyes: Conjunctivae are normal. Pupils are equal, round, and reactive to light.  Neck: Normal range of motion. No JVD present. No thyromegaly present.  Cardiovascular: Normal rate, regular rhythm, normal heart sounds and intact distal pulses. Exam reveals no gallop and no friction rub.  No murmur heard. Pulmonary/Chest: Effort normal and breath sounds normal. No respiratory distress. He has no wheezes. He has no rales. He exhibits no tenderness.  Abdominal: Soft. Bowel sounds are normal. He exhibits no distension and no mass. There is no tenderness. There is no rebound and no guarding.  Musculoskeletal: Normal range of motion. He exhibits edema and tenderness.  Lymphadenopathy:    He has no cervical adenopathy.  Neurological: He is alert and oriented to person, place, and time. He has normal reflexes. No cranial nerve deficit. He exhibits normal muscle tone. He displays a negative Romberg sign. Gait normal.  Skin: Skin is warm and dry. No rash noted.  Psychiatric: He has a normal mood and affect. His behavior is normal. Judgment and thought content normal.   1+ B edema Scab on L knee LS tender B iscial bones - tender   Lab Results  Component Value Date   WBC 9.0 11/01/2017   HGB 16.8 11/01/2017   HCT 50.9 11/01/2017   PLT 123.0 (L) 11/01/2017   GLUCOSE 89 05/18/2017   CHOL 118 05/18/2017   TRIG 82.0 05/18/2017   HDL 40.60 05/18/2017   LDLCALC 61 05/18/2017   ALT 17 05/18/2017   AST 21 05/18/2017   NA 141 05/18/2017   K 4.6 05/18/2017   CL 105 05/18/2017   CREATININE 1.89 (H)  05/18/2017   BUN 16 05/18/2017   CO2 31 05/18/2017   TSH 3.13 05/18/2017   PSA 2.91 05/18/2017   INR 1.20 11/26/2014   HGBA1C 5.9 03/15/2016   MICROALBUR 1.2 05/18/2017    Dg Chest 2 View  Result Date: 09/15/2017 CLINICAL DATA:  Productive cough, shortness of breath and RIGHT chest pain for 5 days. EXAM: CHEST  2 VIEW COMPARISON:  Chest radiograph July 10, 2016 FINDINGS: Cardiac silhouette is mildly enlarged. Mediastinal silhouette is nonsuspicious. Trace calcific atherosclerosis aortic arch. Persistently elevated LEFT hemidiaphragm. No pleural effusion or focal consolidation. No pneumothorax. Mild degenerative change of the spine. Soft tissue planes are nonsuspicious. IMPRESSION: Mild cardiomegaly.  No acute pulmonary process. Aortic Atherosclerosis (ICD10-I70.0). Electronically Signed  By: Elon Alas M.D.   On: 09/15/2017 13:55    Assessment & Plan:   There are no diagnoses linked to this encounter. I am having Arlice Colt "[facility]" maintain his sildenafil, aspirin, Cholecalciferol, B-D 3CC LUER-LOK SYR 22GX1", amLODipine, olmesartan, diclofenac sodium, nitroGLYCERIN, simvastatin, nortriptyline, albuterol, clonazePAM, multivitamin, carvedilol, levothyroxine, budesonide-formoterol, calcitRIOL, testosterone cypionate, HYDROcodone-homatropine, nystatin, and chlorproMAZINE.  No orders of the defined types were placed in this encounter.    Follow-up: No Follow-up on file.  Walker Kehr, MD

## 2017-11-29 NOTE — Assessment & Plan Note (Signed)
Amlodipine, Coreg

## 2017-11-29 NOTE — Assessment & Plan Note (Signed)
severe pain in the pain in B sitting bones - he fell on ice a week ago - hit his back, head. Denies LOC. L knee went underneath - scabbed L knee. Taking Advil 6 a day and Tramadol 3 a day to control pain.  X ray Tramadol prn

## 2017-11-29 NOTE — Patient Instructions (Signed)
Stop Advil Elevate legs Use ice/heat/massage Memory foam pad

## 2017-12-11 ENCOUNTER — Ambulatory Visit: Payer: PPO | Admitting: Internal Medicine

## 2017-12-13 ENCOUNTER — Encounter: Payer: Self-pay | Admitting: Internal Medicine

## 2017-12-13 ENCOUNTER — Ambulatory Visit (INDEPENDENT_AMBULATORY_CARE_PROVIDER_SITE_OTHER): Payer: PPO | Admitting: Internal Medicine

## 2017-12-13 VITALS — BP 128/74 | HR 65 | Temp 98.1°F | Ht 72.0 in | Wt 220.0 lb

## 2017-12-13 DIAGNOSIS — R634 Abnormal weight loss: Secondary | ICD-10-CM

## 2017-12-13 DIAGNOSIS — I10 Essential (primary) hypertension: Secondary | ICD-10-CM

## 2017-12-13 DIAGNOSIS — M7918 Myalgia, other site: Secondary | ICD-10-CM | POA: Diagnosis not present

## 2017-12-13 DIAGNOSIS — R609 Edema, unspecified: Secondary | ICD-10-CM | POA: Diagnosis not present

## 2017-12-13 MED ORDER — "SYRINGE/NEEDLE (DISP) 22G X 1"" 3 ML MISC": 3 refills | Status: DC

## 2017-12-13 MED ORDER — TRAMADOL HCL 50 MG PO TABS: 50.0000 mg | ORAL_TABLET | Freq: Four times a day (QID) | ORAL | 0 refills | Status: DC | PRN

## 2017-12-13 NOTE — Assessment & Plan Note (Signed)
Wt Readings from Last 3 Encounters:  12/13/17 220 lb (99.8 kg)  11/29/17 225 lb (102.1 kg)  11/09/17 222 lb (100.7 kg)   Eat better

## 2017-12-13 NOTE — Assessment & Plan Note (Signed)
Take Amlodipine 5 mg 1/2 tablet a day or stop it if continue to have swelling

## 2017-12-13 NOTE — Patient Instructions (Signed)
Take Amlodipine 5 mg 1/2 tablet a day or stop it if continue to have swelling

## 2017-12-13 NOTE — Progress Notes (Signed)
Subjective:  Patient ID: Scott Oneal, male    DOB: 03-13-1941  Age: 77 y.o. MRN: [id number]  CC: No chief complaint on file.   HPI OWIN VIGNOLA presents for pelvis contusion Knees hurt more R>L after the fall on ice C/o sitting bones pain F/u edema - better (?)   Outpatient Medications Prior to Visit  Medication Sig Dispense Refill  . albuterol (PROAIR HFA) 108 (90 Base) MCG/ACT inhaler Inhale 2 puffs into the lungs every 6 (six) hours as needed for wheezing or shortness of breath. 3 Inhaler 3  . amLODipine (NORVASC) 5 MG tablet TAKE 1 TABLET BY MOUTH EVERY DAY 90 tablet 3  . aspirin 81 MG tablet Take 81 mg by mouth every evening.     . B-D 3CC LUER-LOK SYR 22GX1" 22G X 1" 3 ML MISC USE EVERY 14 DAYS AS DIRECTED 34 each 2  . budesonide-formoterol (SYMBICORT) 160-4.5 MCG/ACT inhaler Inhale 2 puffs into the lungs 2 (two) times daily. 1 Inhaler 11  . calcitRIOL (ROCALTROL) 0.25 MCG capsule TAKE 1 CAPSULE BY MOUTH DAILY. ANNUAL APPT DUE IN OCT MUST SEE MD FOR REFILLS 90 capsule 1  . carvedilol (COREG) 25 MG tablet TAKE 1 TABLET BY MOUTH TWICE A DAY WITH MEALS 180 tablet 1  . chlorproMAZINE (THORAZINE) 25 MG tablet Take 1 tablet (25 mg total) by mouth 3 (three) times daily as needed. 30 tablet 0  . Cholecalciferol 2000 UNITS TABS Take 2,000 Units by mouth daily.     . clonazePAM (KLONOPIN) 0.25 MG disintegrating tablet Take 1 tablet (0.25 mg total) by mouth 2 (two) times daily as needed (anxiety). 60 tablet 1  . diclofenac sodium (VOLTAREN) 1 % GEL Apply 4 g topically 4 (four) times daily. 200 g 5  . furosemide (LASIX) 40 MG tablet Take 1 tablet (40 mg total) by mouth daily as needed for edema. 30 tablet 0  . levothyroxine (SYNTHROID, LEVOTHROID) 150 MCG tablet Take 1 tablet (150 mcg total) by mouth daily. 90 tablet 1  . Multiple Vitamin (MULTIVITAMIN) capsule Take 1 capsule by mouth daily.    . nitroGLYCERIN (NITROSTAT) 0.4 MG SL tablet Place 0.4 mg under the tongue every 5 (five)  minutes as needed for chest pain.    . nortriptyline (PAMELOR) 25 MG capsule Take 1-2 capsules (25-50 mg total) by mouth at bedtime. 60 capsule 5  . nystatin (MYCOSTATIN) 100000 UNIT/ML suspension Take 5 mLs (500,000 Units total) by mouth 4 (four) times daily. 60 mL 0  . olmesartan (BENICAR) 20 MG tablet Take 1 tablet (20 mg total) by mouth daily. 90 tablet 3  . sildenafil (VIAGRA) 100 MG tablet Take 100 mg by mouth daily as needed for erectile dysfunction.     . simvastatin (ZOCOR) 10 MG tablet Take 10 mg by mouth daily.    Marland Kitchen testosterone cypionate (DEPOTESTOSTERONE CYPIONATE) 200 MG/ML injection INJECT 2ML INTO MUSCLE EVERY 2 WEEKS 12 mL 3  . traMADol (ULTRAM) 50 MG tablet Take 1 tablet (50 mg total) by mouth every 6 (six) hours as needed. 28 tablet 0   No facility-administered medications prior to visit.     ROS Review of Systems  Constitutional: Negative for appetite change, fatigue and unexpected weight change.  HENT: Negative for congestion, nosebleeds, sneezing, sore throat and trouble swallowing.   Eyes: Negative for itching and visual disturbance.  Respiratory: Negative for cough.   Cardiovascular: Positive for leg swelling. Negative for chest pain and palpitations.  Gastrointestinal: Negative for abdominal distention, blood in  stool, diarrhea and nausea.  Genitourinary: Negative for frequency and hematuria.  Musculoskeletal: Positive for arthralgias, back pain and gait problem. Negative for joint swelling and neck pain.  Skin: Negative for rash.  Neurological: Negative for dizziness, tremors, speech difficulty and weakness.  Psychiatric/Behavioral: Negative for agitation, dysphoric mood, sleep disturbance and suicidal ideas. The patient is not nervous/anxious.     Objective:  BP 128/74 (BP Location: Left Arm, Patient Position: Sitting, Cuff Size: Large)   Pulse 65   Temp 98.1 F (36.7 C) (Oral)   Ht 6' (1.829 m)   Wt 220 lb (99.8 kg)   SpO2 98%   BMI 29.84 kg/m   BP  Readings from Last 3 Encounters:  12/13/17 128/74  11/29/17 134/72  11/09/17 128/76    Wt Readings from Last 3 Encounters:  12/13/17 220 lb (99.8 kg)  11/29/17 225 lb (102.1 kg)  11/09/17 222 lb (100.7 kg)    Physical Exam  Constitutional: He is oriented to person, place, and time. He appears well-developed. No distress.  NAD  HENT:  Mouth/Throat: Oropharynx is clear and moist.  Eyes: Conjunctivae are normal. Pupils are equal, round, and reactive to light.  Neck: Normal range of motion. No JVD present. No thyromegaly present.  Cardiovascular: Normal rate, regular rhythm, normal heart sounds and intact distal pulses. Exam reveals no gallop and no friction rub.  No murmur heard. Pulmonary/Chest: Effort normal and breath sounds normal. No respiratory distress. He has no wheezes. He has no rales. He exhibits no tenderness.  Abdominal: Soft. Bowel sounds are normal. He exhibits no distension and no mass. There is no tenderness. There is no rebound and no guarding.  Musculoskeletal: Normal range of motion. He exhibits edema and tenderness.  Lymphadenopathy:    He has no cervical adenopathy.  Neurological: He is alert and oriented to person, place, and time. He has normal reflexes. No cranial nerve deficit. He exhibits normal muscle tone. He displays a negative Romberg sign. Coordination and gait normal.  Skin: Skin is warm and dry. No rash noted.  Psychiatric: He has a normal mood and affect. His behavior is normal. Judgment and thought content normal.   Ankles w/1+ edema  R knee tender B ischial bones are tender   Lab Results  Component Value Date   WBC 9.0 11/01/2017   HGB 16.8 11/01/2017   HCT 50.9 11/01/2017   PLT 123.0 (L) 11/01/2017   GLUCOSE 101 (H) 11/29/2017   CHOL 118 05/18/2017   TRIG 82.0 05/18/2017   HDL 40.60 05/18/2017   LDLCALC 61 05/18/2017   ALT 17 05/18/2017   AST 21 05/18/2017   NA 141 11/29/2017   K 4.7 11/29/2017   CL 104 11/29/2017   CREATININE  2.02 (H) 11/29/2017   BUN 17 11/29/2017   CO2 32 11/29/2017   TSH 4.28 11/29/2017   PSA 2.91 05/18/2017   INR 1.20 11/26/2014   HGBA1C 5.9 03/15/2016   MICROALBUR 1.2 05/18/2017    Dg Pelvis 1-2 Views  Result Date: 11/29/2017 CLINICAL DATA:  Recent slip and fall with bilateral hip pain, initial encounter EXAM: PELVIS - 1-2 VIEW COMPARISON:  None. FINDINGS: Pelvic ring is intact. No acute fracture or dislocation is noted. Mild degenerative changes of the hip joints are seen. IMPRESSION: No acute fracture identified. Electronically Signed   By: Inez Catalina M.D.   On: 11/29/2017 19:58    Assessment & Plan:   There are no diagnoses linked to this encounter. I am having Arlice Colt "[place]"  maintain his sildenafil, aspirin, Cholecalciferol, B-D 3CC LUER-LOK SYR 22GX1", amLODipine, olmesartan, diclofenac sodium, nitroGLYCERIN, simvastatin, nortriptyline, albuterol, clonazePAM, multivitamin, carvedilol, levothyroxine, budesonide-formoterol, calcitRIOL, testosterone cypionate, nystatin, chlorproMAZINE, traMADol, and furosemide.  No orders of the defined types were placed in this encounter.    Follow-up: No Follow-up on file.  Walker Kehr, MD

## 2017-12-13 NOTE — Assessment & Plan Note (Signed)
Tramadol prn

## 2017-12-20 ENCOUNTER — Other Ambulatory Visit: Payer: Self-pay | Admitting: Internal Medicine

## 2017-12-27 ENCOUNTER — Other Ambulatory Visit: Payer: Self-pay | Admitting: Internal Medicine

## 2018-01-04 ENCOUNTER — Other Ambulatory Visit: Payer: Self-pay | Admitting: Internal Medicine

## 2018-01-05 DIAGNOSIS — M1711 Unilateral primary osteoarthritis, right knee: Secondary | ICD-10-CM | POA: Diagnosis not present

## 2018-01-08 DIAGNOSIS — M179 Osteoarthritis of knee, unspecified: Secondary | ICD-10-CM | POA: Insufficient documentation

## 2018-01-17 ENCOUNTER — Other Ambulatory Visit: Payer: Self-pay | Admitting: Internal Medicine

## 2018-01-24 ENCOUNTER — Encounter: Payer: Self-pay | Admitting: Internal Medicine

## 2018-01-24 ENCOUNTER — Ambulatory Visit (INDEPENDENT_AMBULATORY_CARE_PROVIDER_SITE_OTHER): Payer: PPO | Admitting: Internal Medicine

## 2018-01-24 DIAGNOSIS — I10 Essential (primary) hypertension: Secondary | ICD-10-CM | POA: Diagnosis not present

## 2018-01-24 DIAGNOSIS — F411 Generalized anxiety disorder: Secondary | ICD-10-CM | POA: Diagnosis not present

## 2018-01-24 DIAGNOSIS — G8929 Other chronic pain: Secondary | ICD-10-CM | POA: Diagnosis not present

## 2018-01-24 DIAGNOSIS — M7918 Myalgia, other site: Secondary | ICD-10-CM

## 2018-01-24 DIAGNOSIS — N183 Chronic kidney disease, stage 3 unspecified: Secondary | ICD-10-CM

## 2018-01-24 DIAGNOSIS — M545 Low back pain: Secondary | ICD-10-CM | POA: Diagnosis not present

## 2018-01-24 DIAGNOSIS — K900 Celiac disease: Secondary | ICD-10-CM

## 2018-01-24 MED ORDER — TRAMADOL HCL 50 MG PO TABS: 50.0000 mg | ORAL_TABLET | Freq: Four times a day (QID) | ORAL | 0 refills | Status: DC | PRN

## 2018-01-24 MED ORDER — AMLODIPINE BESYLATE 5 MG PO TABS: 2.5000 mg | ORAL_TABLET | Freq: Every day | ORAL | 3 refills | Status: DC

## 2018-01-24 NOTE — Progress Notes (Signed)
Subjective:  Patient ID: Scott Oneal, male    DOB: 26-Sep-1941  Age: 77 y.o. MRN: [id number]  CC: No chief complaint on file.   HPI Scott Oneal presents for hip bursitis - a little better, HTN, CAD, DM f/u F/u feet swelling - off Norvasc - BP went up and he re-started Norvasc  Outpatient Medications Prior to Visit  Medication Sig Dispense Refill  . albuterol (PROAIR HFA) 108 (90 Base) MCG/ACT inhaler Inhale 2 puffs into the lungs every 6 (six) hours as needed for wheezing or shortness of breath. 3 Inhaler 3  . amLODipine (NORVASC) 5 MG tablet TAKE 1 TABLET BY MOUTH EVERY DAY 90 tablet 3  . aspirin 81 MG tablet Take 81 mg by mouth every evening.     . budesonide-formoterol (SYMBICORT) 160-4.5 MCG/ACT inhaler Inhale 2 puffs into the lungs 2 (two) times daily. 1 Inhaler 11  . calcitRIOL (ROCALTROL) 0.25 MCG capsule TAKE 1 CAPSULE BY MOUTH DAILY. ANNUAL APPT DUE IN OCT MUST SEE MD FOR REFILLS 90 capsule 1  . carvedilol (COREG) 25 MG tablet TAKE 1 TABLET BY MOUTH TWICE A DAY WITH MEALS 180 tablet 3  . Cholecalciferol 2000 UNITS TABS Take 2,000 Units by mouth daily.     . clonazePAM (KLONOPIN) 0.25 MG disintegrating tablet Take 1 tablet (0.25 mg total) by mouth 2 (two) times daily as needed (anxiety). 60 tablet 1  . diclofenac sodium (VOLTAREN) 1 % GEL Apply 4 g topically 4 (four) times daily. 200 g 5  . levothyroxine (SYNTHROID, LEVOTHROID) 150 MCG tablet Take 1 tablet (150 mcg total) by mouth daily. 90 tablet 1  . Multiple Vitamin (MULTIVITAMIN) capsule Take 1 capsule by mouth daily.    . nitroGLYCERIN (NITROSTAT) 0.4 MG SL tablet Place 0.4 mg under the tongue every 5 (five) minutes as needed for chest pain.    . nortriptyline (PAMELOR) 25 MG capsule Take 1-2 capsules (25-50 mg total) by mouth at bedtime. 60 capsule 5  . olmesartan (BENICAR) 20 MG tablet TAKE 1 TABLET (20 MG TOTAL) BY MOUTH DAILY. 90 tablet 3  . sildenafil (VIAGRA) 100 MG tablet Take 100 mg by mouth daily as needed for  erectile dysfunction.     . simvastatin (ZOCOR) 10 MG tablet Take 10 mg by mouth daily.    . SYRINGE-NEEDLE, DISP, 3 ML (B-D 3CC LUER-LOK SYR 22GX1") 22G X 1" 3 ML MISC USE EVERY 14 DAYS AS DIRECTED 50 each 3  . testosterone cypionate (DEPOTESTOSTERONE CYPIONATE) 200 MG/ML injection INJECT 2ML INTO MUSCLE EVERY 2 WEEKS 12 mL 3  . traMADol (ULTRAM) 50 MG tablet Take 1 tablet (50 mg total) by mouth every 6 (six) hours as needed. 100 tablet 0  . chlorproMAZINE (THORAZINE) 25 MG tablet Take 1 tablet (25 mg total) by mouth 3 (three) times daily as needed. 30 tablet 0  . furosemide (LASIX) 40 MG tablet TAKE 1 TABLET (40 MG TOTAL) BY MOUTH DAILY AS NEEDED FOR EDEMA. 30 tablet 0  . nystatin (MYCOSTATIN) 100000 UNIT/ML suspension Take 5 mLs (500,000 Units total) by mouth 4 (four) times daily. 60 mL 0   No facility-administered medications prior to visit.     ROS Review of Systems  Constitutional: Negative for appetite change, fatigue and unexpected weight change.  HENT: Negative for congestion, nosebleeds, sneezing, sore throat and trouble swallowing.   Eyes: Negative for itching and visual disturbance.  Respiratory: Negative for cough.   Cardiovascular: Positive for leg swelling. Negative for chest pain and palpitations.  Gastrointestinal: Negative for abdominal distention, blood in stool, diarrhea and nausea.  Genitourinary: Negative for frequency and hematuria.  Musculoskeletal: Positive for arthralgias. Negative for back pain, gait problem, joint swelling and neck pain.  Skin: Negative for rash.  Neurological: Negative for dizziness, tremors, speech difficulty and weakness.  Psychiatric/Behavioral: Negative for agitation, dysphoric mood and sleep disturbance. The patient is not nervous/anxious.     Objective:  BP 132/72 (BP Location: Left Arm, Patient Position: Sitting, Cuff Size: Large)   Pulse 66   Temp 98.3 F (36.8 C) (Oral)   Ht 6' (1.829 m)   Wt 214 lb (97.1 kg)   SpO2 98%   BMI  29.02 kg/m   BP Readings from Last 3 Encounters:  01/24/18 132/72  12/13/17 128/74  11/29/17 134/72    Wt Readings from Last 3 Encounters:  01/24/18 214 lb (97.1 kg)  12/13/17 220 lb (99.8 kg)  11/29/17 225 lb (102.1 kg)    Physical Exam  Constitutional: He is oriented to person, place, and time. He appears well-developed. No distress.  NAD  HENT:  Mouth/Throat: Oropharynx is clear and moist.  Eyes: Pupils are equal, round, and reactive to light. Conjunctivae are normal.  Neck: Normal range of motion. No JVD present. No thyromegaly present.  Cardiovascular: Normal rate, regular rhythm, normal heart sounds and intact distal pulses. Exam reveals no gallop and no friction rub.  No murmur heard. Pulmonary/Chest: Effort normal and breath sounds normal. No respiratory distress. He has no wheezes. He has no rales. He exhibits no tenderness.  Abdominal: Soft. Bowel sounds are normal. He exhibits no distension and no mass. There is no tenderness. There is no rebound and no guarding.  Musculoskeletal: Normal range of motion. He exhibits edema. He exhibits no tenderness.  Lymphadenopathy:    He has no cervical adenopathy.  Neurological: He is alert and oriented to person, place, and time. He has normal reflexes. No cranial nerve deficit. He exhibits normal muscle tone. He displays a negative Romberg sign. Coordination and gait normal.  Skin: Skin is warm and dry. No rash noted.  Psychiatric: He has a normal mood and affect. His behavior is normal. Judgment and thought content normal.  1+ edema B  Lab Results  Component Value Date   WBC 9.0 11/01/2017   HGB 16.8 11/01/2017   HCT 50.9 11/01/2017   PLT 123.0 (L) 11/01/2017   GLUCOSE 101 (H) 11/29/2017   CHOL 118 05/18/2017   TRIG 82.0 05/18/2017   HDL 40.60 05/18/2017   LDLCALC 61 05/18/2017   ALT 17 05/18/2017   AST 21 05/18/2017   NA 141 11/29/2017   K 4.7 11/29/2017   CL 104 11/29/2017   CREATININE 2.02 (H) 11/29/2017   BUN 17  11/29/2017   CO2 32 11/29/2017   TSH 4.28 11/29/2017   PSA 2.91 05/18/2017   INR 1.20 11/26/2014   HGBA1C 5.9 03/15/2016   MICROALBUR 1.2 05/18/2017    Dg Pelvis 1-2 Views  Result Date: 11/29/2017 CLINICAL DATA:  Recent slip and fall with bilateral hip pain, initial encounter EXAM: PELVIS - 1-2 VIEW COMPARISON:  None. FINDINGS: Pelvic ring is intact. No acute fracture or dislocation is noted. Mild degenerative changes of the hip joints are seen. IMPRESSION: No acute fracture identified. Electronically Signed   By: Inez Catalina M.D.   On: 11/29/2017 19:58    Assessment & Plan:   There are no diagnoses linked to this encounter. I have discontinued [employer] [facility]"'s nystatin, chlorproMAZINE, and furosemide. I  am also having him maintain his sildenafil, aspirin, Cholecalciferol, diclofenac sodium, nitroGLYCERIN, simvastatin, nortriptyline, albuterol, clonazePAM, multivitamin, levothyroxine, budesonide-formoterol, calcitRIOL, testosterone cypionate, traMADol, SYRINGE-NEEDLE (DISP) 3 ML, amLODipine, olmesartan, and carvedilol.  No orders of the defined types were placed in this encounter.    Follow-up: No follow-ups on file.  Walker Kehr, MD

## 2018-01-24 NOTE — Assessment & Plan Note (Signed)
Doing ok.

## 2018-01-24 NOTE — Assessment & Plan Note (Signed)
Amlodipine 2.5 mg/d, Coreg, Benicar F/u Dr Clover Mealy

## 2018-01-24 NOTE — Assessment & Plan Note (Signed)
Tramadol prn

## 2018-01-24 NOTE — Assessment & Plan Note (Signed)
Amlodipine 2.5 mg/d, Coreg, Benicar Dr Clover Mealy

## 2018-01-24 NOTE — Assessment & Plan Note (Signed)
On Gluten

## 2018-02-02 ENCOUNTER — Other Ambulatory Visit: Payer: Self-pay | Admitting: Internal Medicine

## 2018-02-13 ENCOUNTER — Other Ambulatory Visit: Payer: Self-pay | Admitting: Internal Medicine

## 2018-02-22 DIAGNOSIS — H40003 Preglaucoma, unspecified, bilateral: Secondary | ICD-10-CM | POA: Diagnosis not present

## 2018-02-22 DIAGNOSIS — H26492 Other secondary cataract, left eye: Secondary | ICD-10-CM | POA: Diagnosis not present

## 2018-02-22 DIAGNOSIS — H35051 Retinal neovascularization, unspecified, right eye: Secondary | ICD-10-CM | POA: Diagnosis not present

## 2018-02-27 DIAGNOSIS — H353211 Exudative age-related macular degeneration, right eye, with active choroidal neovascularization: Secondary | ICD-10-CM | POA: Diagnosis not present

## 2018-02-27 DIAGNOSIS — H353121 Nonexudative age-related macular degeneration, left eye, early dry stage: Secondary | ICD-10-CM | POA: Diagnosis not present

## 2018-03-05 DIAGNOSIS — N400 Enlarged prostate without lower urinary tract symptoms: Secondary | ICD-10-CM | POA: Diagnosis not present

## 2018-03-06 ENCOUNTER — Ambulatory Visit (INDEPENDENT_AMBULATORY_CARE_PROVIDER_SITE_OTHER): Payer: PPO | Admitting: *Deleted

## 2018-03-06 VITALS — BP 152/78 | HR 72 | Resp 18 | Ht 72.0 in | Wt 214.0 lb

## 2018-03-06 DIAGNOSIS — Z0000 Encounter for general adult medical examination without abnormal findings: Secondary | ICD-10-CM | POA: Diagnosis not present

## 2018-03-06 NOTE — Patient Instructions (Addendum)
Continue doing brain stimulating activities (puzzles, reading, adult coloring books, staying active) to keep memory sharp.   Continue to eat heart healthy diet (full of fruits, vegetables, whole grains, lean protein, water--limit salt, fat, and sugar intake) and increase physical activity as tolerated.   Scott Oneal , Thank you for taking time to come for your Medicare Wellness Visit. I appreciate your ongoing commitment to your health goals. Please review the following plan we discussed and let me know if I can assist you in the future.   These are the goals we discussed: Goals    . Patient Stated     Stay as healthy and as independent as possible. Enjoy life, family, continue to go on our annual beach trip with my family.       This is a list of the screening recommended for you and due dates:  Health Maintenance  Topic Date Due  . Flu Shot  05/31/2018  . Colon Cancer Screening  06/26/2020  . Tetanus Vaccine  11/24/2020  . Pneumonia vaccines  Completed     Health Maintenance, Male A healthy lifestyle and preventive care is important for your health and wellness. Ask your health care provider about what schedule of regular examinations is right for you. What should I know about weight and diet? Eat a Healthy Diet  Eat plenty of vegetables, fruits, whole grains, low-fat dairy products, and lean protein.  Do not eat a lot of foods high in solid fats, added sugars, or salt.  Maintain a Healthy Weight Regular exercise can help you achieve or maintain a healthy weight. You should:  Do at least 150 minutes of exercise each week. The exercise should increase your heart rate and make you sweat (moderate-intensity exercise).  Do strength-training exercises at least twice a week.  Watch Your Levels of Cholesterol and Blood Lipids  Have your blood tested for lipids and cholesterol every 5 years starting at 77 years of age. If you are at high risk for heart disease, you should start  having your blood tested when you are 77 years old. You may need to have your cholesterol levels checked more often if: ? Your lipid or cholesterol levels are high. ? You are older than 77 years of age. ? You are at high risk for heart disease.  What should I know about cancer screening? Many types of cancers can be detected early and may often be prevented. Lung Cancer  You should be screened every year for lung cancer if: ? You are a current smoker who has smoked for at least 30 years. ? You are a former smoker who has quit within the past 15 years.  Talk to your health care provider about your screening options, when you should start screening, and how often you should be screened.  Colorectal Cancer  Routine colorectal cancer screening usually begins at 77 years of age and should be repeated every 5-10 years until you are 77 years old. You may need to be screened more often if early forms of precancerous polyps or small growths are found. Your health care provider may recommend screening at an earlier age if you have risk factors for colon cancer.  Your health care provider may recommend using home test kits to check for hidden blood in the stool.  A small camera at the end of a tube can be used to examine your colon (sigmoidoscopy or colonoscopy). This checks for the earliest forms of colorectal cancer.  Prostate and Testicular Cancer  Depending on your age and overall health, your health care provider may do certain tests to screen for prostate and testicular cancer.  Talk to your health care provider about any symptoms or concerns you have about testicular or prostate cancer.  Skin Cancer  Check your skin from head to toe regularly.  Tell your health care provider about any new moles or changes in moles, especially if: ? There is a change in a mole's size, shape, or color. ? You have a mole that is larger than a pencil eraser.  Always use sunscreen. Apply sunscreen  liberally and repeat throughout the day.  Protect yourself by wearing long sleeves, pants, a wide-brimmed hat, and sunglasses when outside.  What should I know about heart disease, diabetes, and high blood pressure?  If you are 84-39 years of age, have your blood pressure checked every 3-5 years. If you are 70 years of age or older, have your blood pressure checked every year. You should have your blood pressure measured twice-once when you are at a hospital or clinic, and once when you are not at a hospital or clinic. Record the average of the two measurements. To check your blood pressure when you are not at a hospital or clinic, you can use: ? An automated blood pressure machine at a pharmacy. ? A home blood pressure monitor.  Talk to your health care provider about your target blood pressure.  If you are between 4-53 years old, ask your health care provider if you should take aspirin to prevent heart disease.  Have regular diabetes screenings by checking your fasting blood sugar level. ? If you are at a normal weight and have a low risk for diabetes, have this test once every three years after the age of 29. ? If you are overweight and have a high risk for diabetes, consider being tested at a younger age or more often.  A one-time screening for abdominal aortic aneurysm (AAA) by ultrasound is recommended for men aged 8-75 years who are current or former smokers. What should I know about preventing infection? Hepatitis B If you have a higher risk for hepatitis B, you should be screened for this virus. Talk with your health care provider to find out if you are at risk for hepatitis B infection. Hepatitis C Blood testing is recommended for:  Everyone born from [birth year] through 1965.  Anyone with known risk factors for hepatitis C.  Sexually Transmitted Diseases (STDs)  You should be screened each year for STDs including gonorrhea and chlamydia if: ? You are sexually active and are  younger than 77 years of age. ? You are older than 77 years of age and your health care provider tells you that you are at risk for this type of infection. ? Your sexual activity has changed since you were last screened and you are at an increased risk for chlamydia or gonorrhea. Ask your health care provider if you are at risk.  Talk with your health care provider about whether you are at high risk of being infected with HIV. Your health care provider may recommend a prescription medicine to help prevent HIV infection.  What else can I do?  Schedule regular health, dental, and eye exams.  Stay current with your vaccines (immunizations).  Do not use any tobacco products, such as cigarettes, chewing tobacco, and e-cigarettes. If you need help quitting, ask your health care provider.  Limit alcohol intake to no more than 2 drinks per day. One drink  equals 12 ounces of beer, 5 ounces of wine, or 1 ounces of hard liquor.  Do not use street drugs.  Do not share needles.  Ask your health care provider for help if you need support or information about quitting drugs.  Tell your health care provider if you often feel depressed.  Tell your health care provider if you have ever been abused or do not feel safe at home. This information is not intended to replace advice given to you by your health care provider. Make sure you discuss any questions you have with your health care provider. Document Released: 04/14/2008 Document Revised: 06/15/2016 Document Reviewed: 07/21/2015 Elsevier Interactive Patient Education  [employer].

## 2018-03-06 NOTE — Progress Notes (Addendum)
Subjective:   Scott Oneal is a 77 y.o. male who presents for Medicare Annual/Subsequent preventive examination.  Review of Systems:  No ROS.  Medicare Wellness Visit. Additional risk factors are reflected in the social history.  Cardiac Risk Factors include: advanced age (>74mn, >>73women);dyslipidemia;male gender;hypertension Sleep patterns: has restless sleep, feels rested on waking and sleeps 5-6 hours nightly.    Home Safety/Smoke Alarms: Feels safe in home. Smoke alarms in place.  Living environment; residence and Firearm Safety: [place] no firearms Lives with wife, no needs for DME, good support system. Seat Belt Safety/Bike Helmet: Wears seat belt.     Objective:    Vitals: BP (!) 152/78   Pulse 72   Resp 18   Ht 6' (1.829 m)   Wt 214 lb (97.1 kg)   SpO2 99%   BMI 29.02 kg/m   Body mass index is 29.02 kg/m.  Advanced Directives 03/06/2018 06/06/2017 02/21/2017 07/10/2016 11/26/2014 05/14/2014 03/05/2014  Does Patient Have a Medical Advance Directive? Yes Yes Yes Yes Yes Patient has advance directive, copy not in chart Patient has advance directive, copy not in chart  Type of Advance Directive Living will;[place]care [place] - Living will;Healthcare Power of Attorney Living will  Does patient want to make changes to medical advance directive? - - - No - Patient declined - - -  Copy of [place]? No - copy requested - No - copy requested No - copy requested - - -    Tobacco Social History   Tobacco Use  Smoking Status Never Smoker  Smokeless Tobacco Never Used     Counseling given: Not Answered    Past Medical History:  Diagnosis Date  . Allergy   . Anxiety   . Arthritis   . Asthma   . Benign neoplasm of colon   . Calculus of gallbladder without mention of cholecystitis   . Cataract    bilateral cateracts removed  . Celiac  disease   . Cholelithiasis   . Coronary atherosclerosis of unspecified type of vessel, native or graft   . Diarrhea   . Esophageal reflux   . Glaucoma   . Gout, unspecified   . Hyperparathyroidism   . Long term (current) use of anticoagulants   . Loss of weight   . Old myocardial infarction   . Osteoporosis, unspecified   . Other malaise and fatigue   . Other specified cardiac dysrhythmias(427.89)   . Other testicular hypofunction   . Personal history of colonic polyps   . Pure hypercholesterolemia   . Renal insufficiency    chronic  . Unspecified asthma(493.90)   . Unspecified essential hypertension   . Unspecified hypothyroidism    Past Surgical History:  Procedure Laterality Date  . COLON SURGERY    . COLONOSCOPY    . COLONOSCOPY WITH PROPOFOL N/A 11/27/2014   Procedure: COLONOSCOPY WITH PROPOFOL;  Surgeon: DMilus Banister MD;  [place]  Service: [place]oscopy;  Laterality: N/A;  . CORONARY ANGIOPLASTY    . CORONARY STENT PLACEMENT  2011   Cypher; in distal circumflex artery  . CYSTOSCOPY  1998  . EYE SURGERY    . KNEE SURGERY     right  . SHOULDER SURGERY Left 9/14   Dr BShara Blazing . UPPER GASTROINTESTINAL ENDOSCOPY    . VASCULAR SURGERY     Family History  Problem Relation Age of Onset  .  Lymphoma Father   . Cancer Father        lymphoma  . Hypertension Unknown   . Vision loss Maternal Grandmother   . Colon cancer Son   . Asthma Neg Hx   . Esophageal cancer Neg Hx   . Rectal cancer Neg Hx   . Stomach cancer Neg Hx    Social History   Socioeconomic History  . Marital status: Married    Spouse name: Jubal Rademaker  . Number of children: 3  . Years of education: Not on file  . Highest education level: Not on file  Occupational History  . Occupation: retired    Fish farm manager: RETIRED    CommentYouth worker  . Financial resource strain: Not hard at all  . Food insecurity:    Worry: Never true    Inability: Never true  .  Transportation needs:    Medical: No    Non-medical: No  Tobacco Use  . Smoking status: Never Smoker  . Smokeless tobacco: Never Used  Substance and Sexual Activity  . Alcohol use: No  . Drug use: No  . Sexual activity: Yes  Lifestyle  . Physical activity:    Days per week: 3 days    Minutes per session: 60 min  . Stress: Only a little  Relationships  . Social connections:    Talks on phone: More than three times a week    Gets together: More than three times a week    Attends religious service: 1 to 4 times per year    Active member of club or organization: Yes    Attends meetings of clubs or organizations: 1 to 4 times per year    Relationship status: Married  Other Topics Concern  . Not on file  Social History Narrative   Retired.   Regular Exercise-Yes; yoga & silver sneakers   Daily Caffeine Use.             Outpatient Encounter Medications as of 03/06/2018  Medication Sig  . albuterol (PROAIR HFA) 108 (90 Base) MCG/ACT inhaler Inhale 2 puffs into the lungs every 6 (six) hours as needed for wheezing or shortness of breath.  Marland Kitchen amLODipine (NORVASC) 5 MG tablet Take 0.5 tablets (2.5 mg total) by mouth daily.  Marland Kitchen aspirin 81 MG tablet Take 81 mg by mouth every evening.   . budesonide-formoterol (SYMBICORT) 160-4.5 MCG/ACT inhaler Inhale 2 puffs into the lungs 2 (two) times daily.  . calcitRIOL (ROCALTROL) 0.25 MCG capsule TAKE 1 CAPSULE BY MOUTH DAILY. ANNUAL APPT DUE IN OCT MUST SEE MD FOR REFILLS  . carvedilol (COREG) 25 MG tablet TAKE 1 TABLET BY MOUTH TWICE A DAY WITH MEALS  . Cholecalciferol 2000 UNITS TABS Take 2,000 Units by mouth daily.   . clonazePAM (KLONOPIN) 0.25 MG disintegrating tablet Take 1 tablet (0.25 mg total) by mouth 2 (two) times daily as needed (anxiety).  Marland Kitchen diclofenac sodium (VOLTAREN) 1 % GEL APPLY 4 G TOPICALLY 4 TIMES DAILY.  Marland Kitchen levothyroxine (SYNTHROID, LEVOTHROID) 150 MCG tablet TAKE 1 TABLET BY MOUTH EVERY DAY  . Multiple Vitamin (MULTIVITAMIN)  capsule Take 1 capsule by mouth daily.  . nitroGLYCERIN (NITROSTAT) 0.4 MG SL tablet Place 0.4 mg under the tongue every 5 (five) minutes as needed for chest pain.  . nortriptyline (PAMELOR) 25 MG capsule Take 1-2 capsules (25-50 mg total) by mouth at bedtime.  Marland Kitchen olmesartan (BENICAR) 20 MG tablet TAKE 1 TABLET (20 MG TOTAL) BY MOUTH DAILY.  . sildenafil (  VIAGRA) 100 MG tablet Take 100 mg by mouth daily as needed for erectile dysfunction.   . simvastatin (ZOCOR) 10 MG tablet Take 10 mg by mouth daily.  . SYRINGE-NEEDLE, DISP, 3 ML (B-D 3CC LUER-LOK SYR 22GX1") 22G X 1" 3 ML MISC USE EVERY 14 DAYS AS DIRECTED  . testosterone cypionate (DEPOTESTOSTERONE CYPIONATE) 200 MG/ML injection INJECT 2ML INTO MUSCLE EVERY 2 WEEKS  . traMADol (ULTRAM) 50 MG tablet Take 1 tablet (50 mg total) by mouth every 6 (six) hours as needed.   No facility-administered encounter medications on file as of 03/06/2018.     Activities of Daily Living In your present state of health, do you have any difficulty performing the following activities: 03/06/2018  Hearing? N  Vision? N  Difficulty concentrating or making decisions? N  Walking or climbing stairs? N  Dressing or bathing? N  Doing errands, shopping? N  Preparing Food and eating ? N  Using the Toilet? N  In the past six months, have you accidently leaked urine? N  Do you have problems with loss of bowel control? N  Managing your Medications? N  Managing your Finances? N  Housekeeping or managing your Housekeeping? N  Some recent data might be hidden    Patient Care Team: Plotnikov, Evie Lacks, MD as PCP - General Joya Gaskins Burnett Harry, MD as Consulting Physician (Pulmonary Disease) Ladene Artist, MD as Consulting Physician (Gastroenterology) Franchot Gallo, MD as Consulting Physician (Urology) Corliss Parish, MD as Consulting Physician (Nephrology) Sherren Mocha, MD as Consulting Physician (Cardiology)   Assessment:   This is a routine wellness  examination for [place]. Physical assessment deferred to PCP.   Exercise Activities and Dietary recommendations Current Exercise Habits: Structured exercise class, Type of exercise: walking;calisthenics;strength training/weights, Time (Minutes): 50, Frequency (Times/Week): 3, Weekly Exercise (Minutes/Week): 150, Intensity: Mild, Exercise limited by: orthopedic condition(s) Diet (meal preparation, eat out, water intake, caffeinated beverages, dairy products, fruits and vegetables): in general, a "healthy" diet  , well balanced   Reviewed heart healthy diet, encouraged patient to increase daily water intake.  Goals    . Patient Stated     Stay as healthy and as independent as possible. Enjoy life, family, continue to go on our annual beach trip with my family.       Fall Risk Fall Risk  03/06/2018 10/23/2017 10/23/2017 02/21/2017 02/29/2016  Falls in the past year? Yes No No No No  Number falls in past yr: 1 - - - -  Follow up Falls prevention discussed - - - -    Depression Screen PHQ 2/9 Scores 03/06/2018 10/23/2017 10/23/2017 02/21/2017  PHQ - 2 Score 1 0 0 0  PHQ- 9 Score 1 - - 2    Cognitive Function MMSE - Mini Mental State Exam 03/06/2018  Orientation to time 5  Orientation to Place 5  Registration 3  Attention/ Calculation 5  Recall 2  Language- name 2 objects 2  Language- repeat 1  Language- follow 3 step command 3  Language- read & follow direction 1  Write a sentence 1  Copy design 1  Total score 29        Immunization History  Administered Date(s) Administered  . Influenza Split 07/27/2011, 08/07/2012  . Influenza Whole 10/04/2002, 07/30/2008, 08/12/2009, 07/07/2010  . Influenza, High Dose Seasonal PF 08/07/2015, 08/01/2016, 07/20/2017  . Influenza,inj,Quad PF,6+ Mos 07/17/2013, 07/21/2014  . Pneumococcal Conjugate-13 09/30/2013  . Pneumococcal Polysaccharide-23 05/15/2006, 12/12/2012  . Td 11/24/2010  . Zoster 07/11/2006  . Zoster  Recombinat (Shingrix)  03/02/2017, 06/08/2017   Screening Tests Health Maintenance  Topic Date Due  . INFLUENZA VACCINE  05/31/2018  . COLONOSCOPY  06/26/2020  . TETANUS/TDAP  11/24/2020  . PNA vac Low Risk Adult  Completed      Plan:     I have personally reviewed and noted the following in the patient's chart:   . Medical and social history . Use of alcohol, tobacco or illicit drugs  . Current medications and supplements . Functional ability and status . Nutritional status . Physical activity . Advanced directives . List of other physicians . Vitals . Screenings to include cognitive, depression, and falls . Referrals and appointments  In addition, I have reviewed and discussed with patient certain preventive protocols, quality metrics, and best practice recommendations. A written personalized care plan for preventive services as well as general preventive health recommendations were provided to patient.     Michiel Cowboy, RN  03/06/2018  Medical screening examination/treatment/procedure(s) were performed by non-physician practitioner and as supervising physician I was immediately available for consultation/collaboration. I agree with above. Lew Dawes, MD

## 2018-03-17 ENCOUNTER — Other Ambulatory Visit: Payer: Self-pay | Admitting: Internal Medicine

## 2018-03-28 DIAGNOSIS — H353211 Exudative age-related macular degeneration, right eye, with active choroidal neovascularization: Secondary | ICD-10-CM | POA: Diagnosis not present

## 2018-04-21 ENCOUNTER — Other Ambulatory Visit: Payer: Self-pay | Admitting: Internal Medicine

## 2018-04-26 ENCOUNTER — Encounter: Payer: Self-pay | Admitting: Internal Medicine

## 2018-04-26 ENCOUNTER — Ambulatory Visit (INDEPENDENT_AMBULATORY_CARE_PROVIDER_SITE_OTHER): Payer: PPO | Admitting: Internal Medicine

## 2018-04-26 DIAGNOSIS — R609 Edema, unspecified: Secondary | ICD-10-CM

## 2018-04-26 DIAGNOSIS — H3530 Unspecified macular degeneration: Secondary | ICD-10-CM | POA: Insufficient documentation

## 2018-04-26 DIAGNOSIS — N183 Chronic kidney disease, stage 3 unspecified: Secondary | ICD-10-CM

## 2018-04-26 DIAGNOSIS — G8929 Other chronic pain: Secondary | ICD-10-CM

## 2018-04-26 DIAGNOSIS — M545 Low back pain: Secondary | ICD-10-CM

## 2018-04-26 DIAGNOSIS — H353110 Nonexudative age-related macular degeneration, right eye, stage unspecified: Secondary | ICD-10-CM | POA: Diagnosis not present

## 2018-04-26 DIAGNOSIS — I10 Essential (primary) hypertension: Secondary | ICD-10-CM

## 2018-04-26 DIAGNOSIS — J4520 Mild intermittent asthma, uncomplicated: Secondary | ICD-10-CM

## 2018-04-26 MED ORDER — CEFUROXIME AXETIL 250 MG PO TABS: 250.0000 mg | ORAL_TABLET | Freq: Two times a day (BID) | ORAL | 0 refills | Status: AC

## 2018-04-26 NOTE — Assessment & Plan Note (Signed)
F/u w/Dr Clover Mealy

## 2018-04-26 NOTE — Assessment & Plan Note (Signed)
Worse Stop Amlodipine

## 2018-04-26 NOTE — Assessment & Plan Note (Signed)
Ceftin x 7 d

## 2018-04-26 NOTE — Assessment & Plan Note (Signed)
AMD - wet; R eye Dr Lonia Farber

## 2018-04-26 NOTE — Assessment & Plan Note (Signed)
Doing fair

## 2018-04-26 NOTE — Patient Instructions (Addendum)
Stop Amlodipine Call if BP>140/100

## 2018-04-26 NOTE — Assessment & Plan Note (Signed)
Amlodipine 2.5 mg/d, Coreg, Benicar

## 2018-04-26 NOTE — Progress Notes (Signed)
Subjective:  Patient ID: Scott Oneal, male    DOB: 1941/10/22  Age: 77 y.o. MRN: [id number]  CC: No chief complaint on file.   HPI Scott Oneal presents for DM, HTN, OA, CAD f/u C/o feet swelling - worse C/o cough - green sputum x 1 mo  Outpatient Medications Prior to Visit  Medication Sig Dispense Refill  . amLODipine (NORVASC) 5 MG tablet Take 0.5 tablets (2.5 mg total) by mouth daily. 90 tablet 3  . aspirin 81 MG tablet Take 81 mg by mouth every evening.     . calcitRIOL (ROCALTROL) 0.25 MCG capsule TAKE 1 CAPSULE BY MOUTH DAILY. ANNUAL APPT DUE IN OCT MUST SEE MD FOR REFILLS 90 capsule 1  . carvedilol (COREG) 25 MG tablet TAKE 1 TABLET BY MOUTH TWICE A DAY WITH MEALS 180 tablet 3  . Cholecalciferol 2000 UNITS TABS Take 2,000 Units by mouth daily.     . clonazePAM (KLONOPIN) 0.25 MG disintegrating tablet Take 1 tablet (0.25 mg total) by mouth 2 (two) times daily as needed (anxiety). 60 tablet 1  . diclofenac sodium (VOLTAREN) 1 % GEL APPLY 4 G TOPICALLY 4 TIMES DAILY. 200 g 5  . levothyroxine (SYNTHROID, LEVOTHROID) 150 MCG tablet TAKE 1 TABLET BY MOUTH EVERY DAY 90 tablet 3  . Multiple Vitamin (MULTIVITAMIN) capsule Take 1 capsule by mouth daily.    . nitroGLYCERIN (NITROSTAT) 0.4 MG SL tablet Place 0.4 mg under the tongue every 5 (five) minutes as needed for chest pain.    . nortriptyline (PAMELOR) 25 MG capsule Take 1-2 capsules (25-50 mg total) by mouth at bedtime. 60 capsule 5  . olmesartan (BENICAR) 20 MG tablet TAKE 1 TABLET (20 MG TOTAL) BY MOUTH DAILY. 90 tablet 3  . sildenafil (VIAGRA) 100 MG tablet Take 100 mg by mouth daily as needed for erectile dysfunction.     . simvastatin (ZOCOR) 10 MG tablet Take 1 tablet (10 mg total) by mouth at bedtime. Annual appt due in July must see provider for future refills 90 tablet 0  . SYRINGE-NEEDLE, DISP, 3 ML (B-D 3CC LUER-LOK SYR 22GX1") 22G X 1" 3 ML MISC USE EVERY 14 DAYS AS DIRECTED 50 each 3  . testosterone cypionate  (DEPOTESTOSTERONE CYPIONATE) 200 MG/ML injection INJECT 2ML INTO MUSCLE EVERY 2 WEEKS 12 mL 3  . albuterol (PROAIR HFA) 108 (90 Base) MCG/ACT inhaler Inhale 2 puffs into the lungs every 6 (six) hours as needed for wheezing or shortness of breath. 3 Inhaler 3  . budesonide-formoterol (SYMBICORT) 160-4.5 MCG/ACT inhaler Inhale 2 puffs into the lungs 2 (two) times daily. 1 Inhaler 11  . simvastatin (ZOCOR) 10 MG tablet Take 10 mg by mouth daily.    . traMADol (ULTRAM) 50 MG tablet Take 1 tablet (50 mg total) by mouth every 6 (six) hours as needed. 100 tablet 0   No facility-administered medications prior to visit.     ROS: Review of Systems  Constitutional: Negative for appetite change, fatigue and unexpected weight change.  HENT: Negative for congestion, nosebleeds, sneezing, sore throat and trouble swallowing.   Eyes: Negative for itching and visual disturbance.  Respiratory: Negative for cough.   Cardiovascular: Negative for chest pain, palpitations and leg swelling.  Gastrointestinal: Negative for abdominal distention, blood in stool, diarrhea and nausea.  Genitourinary: Negative for frequency and hematuria.  Musculoskeletal: Positive for arthralgias. Negative for back pain, gait problem, joint swelling and neck pain.  Skin: Negative for rash.  Neurological: Negative for dizziness, tremors, speech difficulty  and weakness.  Psychiatric/Behavioral: Negative for agitation, dysphoric mood and sleep disturbance. The patient is not nervous/anxious.     Objective:  There were no vitals taken for this visit.  BP Readings from Last 3 Encounters:  03/06/18 (!) 152/78  01/24/18 132/72  12/13/17 128/74    Wt Readings from Last 3 Encounters:  03/06/18 214 lb (97.1 kg)  01/24/18 214 lb (97.1 kg)  12/13/17 220 lb (99.8 kg)    Physical Exam  Constitutional: He is oriented to person, place, and time. He appears well-developed. No distress.  NAD  HENT:  Mouth/Throat: Oropharynx is clear and  moist.  Eyes: Pupils are equal, round, and reactive to light. Conjunctivae are normal.  Neck: Normal range of motion. No JVD present. No thyromegaly present.  Cardiovascular: Normal rate, regular rhythm, normal heart sounds and intact distal pulses. Exam reveals no gallop and no friction rub.  No murmur heard. Pulmonary/Chest: Effort normal and breath sounds normal. No respiratory distress. He has no wheezes. He has no rales. He exhibits no tenderness.  Abdominal: Soft. Bowel sounds are normal. He exhibits no distension and no mass. There is no tenderness. There is no rebound and no guarding.  Musculoskeletal: Normal range of motion. He exhibits no edema or tenderness.  Lymphadenopathy:    He has no cervical adenopathy.  Neurological: He is alert and oriented to person, place, and time. He has normal reflexes. No cranial nerve deficit. He exhibits normal muscle tone. He displays a negative Romberg sign. Coordination and gait normal.  Skin: Skin is warm and dry. No rash noted.  Psychiatric: He has a normal mood and affect. His behavior is normal. Judgment and thought content normal.    Lab Results  Component Value Date   WBC 9.0 11/01/2017   HGB 16.8 11/01/2017   HCT 50.9 11/01/2017   PLT 123.0 (L) 11/01/2017   GLUCOSE 101 (H) 11/29/2017   CHOL 118 05/18/2017   TRIG 82.0 05/18/2017   HDL 40.60 05/18/2017   LDLCALC 61 05/18/2017   ALT 17 05/18/2017   AST 21 05/18/2017   NA 141 11/29/2017   K 4.7 11/29/2017   CL 104 11/29/2017   CREATININE 2.02 (H) 11/29/2017   BUN 17 11/29/2017   CO2 32 11/29/2017   TSH 4.28 11/29/2017   PSA 2.91 05/18/2017   INR 1.20 11/26/2014   HGBA1C 5.9 03/15/2016   MICROALBUR 1.2 05/18/2017    Dg Pelvis 1-2 Views  Result Date: 11/29/2017 CLINICAL DATA:  Recent slip and fall with bilateral hip pain, initial encounter EXAM: PELVIS - 1-2 VIEW COMPARISON:  None. FINDINGS: Pelvic ring is intact. No acute fracture or dislocation is noted. Mild degenerative  changes of the hip joints are seen. IMPRESSION: No acute fracture identified. Electronically Signed   By: Inez Catalina M.D.   On: 11/29/2017 19:58    Assessment & Plan:   There are no diagnoses linked to this encounter.   No orders of the defined types were placed in this encounter.    Follow-up: No follow-ups on file.  Walker Kehr, MD

## 2018-05-10 DIAGNOSIS — H353211 Exudative age-related macular degeneration, right eye, with active choroidal neovascularization: Secondary | ICD-10-CM | POA: Diagnosis not present

## 2018-05-14 DIAGNOSIS — N183 Chronic kidney disease, stage 3 unspecified: Secondary | ICD-10-CM | POA: Insufficient documentation

## 2018-05-14 DIAGNOSIS — N189 Chronic kidney disease, unspecified: Secondary | ICD-10-CM | POA: Insufficient documentation

## 2018-05-14 NOTE — Progress Notes (Signed)
Cardiology Office Note:    Date:  05/15/2018   ID:  Bradden, Tadros [birth year], MRN [id number]  PCP:  Cassandria Anger, MD  Cardiologist:  Sherren Mocha, MD    Referring MD: Cassandria Anger, MD   Chief Complaint  Patient presents with  . Follow-up    CAD    History of Present Illness:    Scott Oneal is a 77 y.o. male with coronary artery disease s/p inferior myocardial infarction in 2005 treated with overlapping drug eluting stents to the RCA and staged PCI with DES to the LCx, chronic kidney disease, hypertension, hyperlipidemia.  He has been maintained on long term dual antiplatelet therapy (first generation DES).  Last seen by Dr. Sherren Mocha in 7/18.  His Plavix was discontinued at that visit.    Scott Oneal returns for follow up on coronary artery disease.  He is here alone.  He goes to the [facility] for exercise.  He has not noticed any limitations.  He denies chest pain, shortness of breath, paroxysmal nocturnal dyspnea, orthopnea, syncope.  He has noted lower extremity swelling.  His PCP stopped his Amlodipine.  It has improved since then.  He notices that the swelling generally occurs with prolonged standing and resolves with leg elevation.    Prior CV studies:   The following studies were reviewed today:  ABIs 11/17 Normal bilaterally  Carotid US 11/15 Bilateral ICA 1-39  PCI 06/08/04 PCI:  2.5 x 8 mm Taxus DES to distal LCx  Cardiac Catheterization 06/06/04 EF 60 LM ok LAD prox 40, Dx 50 LCx diff 30, dist 80 RCA prox 100 PCI:  33 x 30 mm Cypher DES, 8 x 30 mm Cypher DES to RCA  Past Medical History:  Diagnosis Date  . Allergy   . Anxiety   . Arthritis   . Asthma   . Benign neoplasm of colon   . Calculus of gallbladder without mention of cholecystitis   . Cataract    bilateral cateracts removed  . Celiac disease   . Cholelithiasis   . Coronary atherosclerosis of unspecified type of vessel, native or graft   . Diarrhea   . Esophageal reflux     . Glaucoma   . Gout, unspecified   . Hyperparathyroidism   . Long term (current) use of anticoagulants   . Loss of weight   . Old myocardial infarction   . Osteoporosis, unspecified   . Other malaise and fatigue   . Other specified cardiac dysrhythmias(427.89)   . Other testicular hypofunction   . Personal history of colonic polyps   . Pure hypercholesterolemia   . Renal insufficiency    chronic  . Unspecified asthma(493.90)   . Unspecified essential hypertension   . Unspecified hypothyroidism    Surgical Hx: The patient  has a past surgical history that includes Knee surgery; Coronary stent placement (2011); Cystoscopy (1998); Shoulder surgery (Left, 9/14); Colonoscopy; Upper gastrointestinal endoscopy; Coronary angioplasty; Vascular surgery; Colon surgery; Eye surgery; and Colonoscopy with propofol (N/A, 11/27/2014).   Current Medications: Current Meds  Medication Sig  . aspirin 81 MG tablet Take 81 mg by mouth every evening.   . calcitRIOL (ROCALTROL) 0.25 MCG capsule TAKE 1 CAPSULE BY MOUTH DAILY. ANNUAL APPT DUE IN OCT MUST SEE MD FOR REFILLS  . carvedilol (COREG) 25 MG tablet TAKE 1 TABLET BY MOUTH TWICE A DAY WITH MEALS  . Cholecalciferol 2000 UNITS TABS Take 2,000 Units by mouth daily.   . clonazePAM (KLONOPIN) 0.25 MG  disintegrating tablet Take 1 tablet (0.25 mg total) by mouth 2 (two) times daily as needed (anxiety).  Marland Kitchen diclofenac sodium (VOLTAREN) 1 % GEL APPLY 4 G TOPICALLY 4 TIMES DAILY.  Marland Kitchen levothyroxine (SYNTHROID, LEVOTHROID) 150 MCG tablet TAKE 1 TABLET BY MOUTH EVERY DAY  . Multiple Vitamin (MULTIVITAMIN) capsule Take 1 capsule by mouth daily.  . nitroGLYCERIN (NITROSTAT) 0.4 MG SL tablet Place 0.4 mg under the tongue every 5 (five) minutes as needed for chest pain.  . nortriptyline (PAMELOR) 25 MG capsule Take 1-2 capsules (25-50 mg total) by mouth at bedtime.  Marland Kitchen olmesartan (BENICAR) 20 MG tablet TAKE 1 TABLET (20 MG TOTAL) BY MOUTH DAILY.  . sildenafil (VIAGRA)  100 MG tablet Take 100 mg by mouth daily as needed for erectile dysfunction.   . simvastatin (ZOCOR) 10 MG tablet Take 1 tablet (10 mg total) by mouth at bedtime. Annual appt due in July must see provider for future refills  . SYRINGE-NEEDLE, DISP, 3 ML (B-D 3CC LUER-LOK SYR 22GX1") 22G X 1" 3 ML MISC USE EVERY 14 DAYS AS DIRECTED  . testosterone cypionate (DEPOTESTOSTERONE CYPIONATE) 200 MG/ML injection INJECT 2ML INTO MUSCLE EVERY 2 WEEKS     Allergies:   Amlodipine; Benzalkonium chloride; Biaxin [clarithromycin]; Allantoin-pramoxine; and Metoprolol tartrate   Social History   Tobacco Use  . Smoking status: Never Smoker  . Smokeless tobacco: Never Used  Substance Use Topics  . Alcohol use: No  . Drug use: No     Family Hx: The patient's family history includes Cancer in his father; Colon cancer in his son; Hypertension in his unknown relative; Lymphoma in his father; Vision loss in his maternal grandmother. There is no history of Asthma, Esophageal cancer, Rectal cancer, or Stomach cancer.  ROS:   Please see the history of present illness.    Review of Systems  Eyes: Positive for visual disturbance.  Cardiovascular: Positive for leg swelling.  Musculoskeletal: Positive for joint pain.   All other systems reviewed and are negative.   EKGs/Labs/Other Test Reviewed:    EKG:  EKG is   ordered today.  The ekg ordered today demonstrates normal sinus rhythm, HR 60, inf Q waves, QTc 396 ms, similar to prior tracing 05/12/17  Recent Labs: 05/18/2017: ALT 17 11/01/2017: Hemoglobin 16.8; Platelets 123.0 11/29/2017: BUN 17; Creatinine, Ser 2.02; Potassium 4.7; Sodium 141; TSH 4.28   Recent Lipid Panel Lab Results  Component Value Date/Time   CHOL 118 05/18/2017 07:21 AM   TRIG 82.0 05/18/2017 07:21 AM   HDL 40.60 05/18/2017 07:21 AM   CHOLHDL 3 05/18/2017 07:21 AM   LDLCALC 61 05/18/2017 07:21 AM    Physical Exam:    VS:  BP 128/82   Pulse 60   Ht 6' (1.829 m)   Wt 217 lb 9.6  oz (98.7 kg)   SpO2 97%   BMI 29.51 kg/m     Wt Readings from Last 3 Encounters:  05/15/18 217 lb 9.6 oz (98.7 kg)  04/26/18 220 lb (99.8 kg)  03/06/18 214 lb (97.1 kg)     Physical Exam  Constitutional: He is oriented to person, place, and time. He appears well-developed and well-nourished. No distress.  HENT:  Head: Normocephalic and atraumatic.  Eyes: No scleral icterus.  Neck: Neck supple. No JVD present. Carotid bruit is not present.  Cardiovascular: Normal rate, regular rhythm, S1 normal and S2 normal.  No murmur heard. Pulmonary/Chest: Breath sounds normal. He has no rales.  Abdominal: Soft. There is no hepatomegaly.  Musculoskeletal: He exhibits edema (trace-1+ bilat LE edema).  Neurological: He is alert and oriented to person, place, and time.  Skin: Skin is warm and dry.  Psychiatric: He has a normal mood and affect.    ASSESSMENT & PLAN:    Coronary artery disease involving native coronary artery of native heart without angina pectoris Hx of inferior myocardial infarction in 2005 treated with PCI to the RCA (DES x 2) and staged PCI with DES to the LCx.  He was maintained on dual antiplatelet therapy for 13 years post MI.  He stopped his Clopidogrel last year.  He is doing well without angina.  He will remain on ASA, Carvedilol, Simvastatin.    Essential hypertension The patient's blood pressure is controlled on his current regimen.  Continue current therapy.    HYPERCHOLESTEROLEMIA LDL optimal on July 2018 lab work.  Continue current Rx.    CKD (chronic kidney disease) stage 3, GFR 30-59 ml/min [facility])   He is followed by Nephrology (Dr. Moshe Cipro).  Recent creatinine was stable.   Dispo:  Return in about 1 year (around 05/16/2019) for Routine Follow Up, w/ Dr. Burt Knack.   Medication Adjustments/Labs and Tests Ordered: Current medicines are reviewed at length with the patient today.  Concerns regarding medicines are outlined above.  Tests Ordered: Orders Placed  This Encounter  Procedures  . EKG 12-Lead   Medication Changes: No orders of the defined types were placed in this encounter.   Signed, Richardson Dopp, PA-C  05/15/2018 1:59 PM    [facility] [address], [place], [place]  [address] Phone: [phone]; Fax: [phone]

## 2018-05-15 ENCOUNTER — Ambulatory Visit: Payer: PPO | Admitting: Physician Assistant

## 2018-05-15 ENCOUNTER — Encounter: Payer: Self-pay | Admitting: Physician Assistant

## 2018-05-15 VITALS — BP 128/82 | HR 60 | Ht 72.0 in | Wt 217.6 lb

## 2018-05-15 DIAGNOSIS — I252 Old myocardial infarction: Secondary | ICD-10-CM

## 2018-05-15 DIAGNOSIS — N183 Chronic kidney disease, stage 3 unspecified: Secondary | ICD-10-CM

## 2018-05-15 DIAGNOSIS — I10 Essential (primary) hypertension: Secondary | ICD-10-CM | POA: Diagnosis not present

## 2018-05-15 DIAGNOSIS — E7800 Pure hypercholesterolemia, unspecified: Secondary | ICD-10-CM

## 2018-05-15 DIAGNOSIS — I2510 Atherosclerotic heart disease of native coronary artery without angina pectoris: Secondary | ICD-10-CM

## 2018-05-15 NOTE — Patient Instructions (Signed)
Medication Instructions: Your physician recommends that you continue on your current medications as directed. Please refer to the Current Medication list given to you today.   Labwork: None Ordered  Procedures/Testing: None Ordered  Follow-Up: Your physician wants you to follow-up in: 1 year with Dr. Emelda Fear will receive a reminder letter in the mail two months in advance. If you don't receive a letter, please call our office to schedule the follow-up appointment.   Any Additional Special Instructions Will Be Listed Below (If Applicable).  Try compression stockings to keep legs from swelling     If you need a refill on your cardiac medications before your next appointment, please call your pharmacy.

## 2018-05-27 ENCOUNTER — Other Ambulatory Visit: Payer: Self-pay | Admitting: Internal Medicine

## 2018-06-24 ENCOUNTER — Other Ambulatory Visit: Payer: Self-pay | Admitting: Internal Medicine

## 2018-06-24 MED ORDER — METHYLPREDNISOLONE 4 MG PO TBPK: ORAL_TABLET | ORAL | 0 refills | Status: DC

## 2018-07-09 ENCOUNTER — Ambulatory Visit (INDEPENDENT_AMBULATORY_CARE_PROVIDER_SITE_OTHER): Payer: PPO | Admitting: Internal Medicine

## 2018-07-09 ENCOUNTER — Encounter: Payer: Self-pay | Admitting: Internal Medicine

## 2018-07-09 DIAGNOSIS — M545 Low back pain: Secondary | ICD-10-CM | POA: Diagnosis not present

## 2018-07-09 DIAGNOSIS — G8929 Other chronic pain: Secondary | ICD-10-CM

## 2018-07-09 DIAGNOSIS — L570 Actinic keratosis: Secondary | ICD-10-CM

## 2018-07-09 DIAGNOSIS — G5732 Lesion of lateral popliteal nerve, left lower limb: Secondary | ICD-10-CM | POA: Insufficient documentation

## 2018-07-09 DIAGNOSIS — N183 Chronic kidney disease, stage 3 unspecified: Secondary | ICD-10-CM

## 2018-07-09 MED ORDER — FUROSEMIDE 20 MG PO TABS: 20.0000 mg | ORAL_TABLET | Freq: Every day | ORAL | 1 refills | Status: DC | PRN

## 2018-07-09 NOTE — Assessment & Plan Note (Signed)
Controlled.  

## 2018-07-09 NOTE — Patient Instructions (Signed)
Common Peroneal Nerve Entrapment Common peroneal nerve entrapment is a condition that can make it hard to lift a foot. The condition results from pressure on a nerve in the lower leg called the common peroneal nerve. Your common peroneal nerve provides feeling to your outer lower leg and foot. It also supplies the muscles that move your foot and toes upward and outward. What are the causes? This condition may be caused by:  A hard, direct hit to the side of the lower leg.  Swelling from a knee injury.  A break (fracture) in one of the lower leg bones.  Wearing a boot or cast that ends just below the knee.  A growth or cyst near the nerve.  What increases the risk? This condition is more likely to develop in people who play:  Contact sports, such as football or hockey.  Sports in which you wear high and stiff boots, such as skiing.  What are the signs or symptoms? Symptoms of this condition include:  Trouble lifting your foot up.  Tripping often.  Your foot hitting the ground harder than normal as you walk.  Numbness, tingling, or pain in the outside of the knee, outside of the lower leg, and top of the foot.  Sensitivity to pressure on the front or side of the leg.  How is this diagnosed? This condition may be diagnosed based on:  Your symptoms.  Your medical history.  A physical exam.  Tests, such as: ? An X-ray to check the bones of your knee and leg. ? MRI to check tendons that attach to the side of your knee. ? An electromyogram (EMG) to check your nerves.  During your physical exam, your health care provider will check for numbness in your leg and test the strength of your lower leg muscles. He or she may tap the side of your lower leg to see if that causes tingling. How is this treated? Treatment for this condition may include:  Avoiding activities that make symptoms worse.  Using a brace to hold up your foot and toes.  Taking anti-inflammatory pain  medicines to relieve swelling and reduce pain.  Having medicines injected into your ankle joint to reduce pain and swelling.  Physical therapy. This involves doing exercises.  Returning gradually to full activity.  Surgery to take pressure off the nerve. This may be needed if there is no improvement after 2-3 months or if there is a growth pushing on the nerve.  Follow these instructions at home: If you have a brace:  Wear it as told by your health care provider. Remove it only as told by your health care provider.  Loosen the brace if your toes tingle, become numb, or turn cold and blue.  Keep the brace clean.  If the brace is not waterproof: ? Do not let it get wet. ? Cover it with a watertight covering when you take a bath or a shower.  Ask your health care provider when it is safe to drive with a brace on your foot. Activity  Return to your normal activities as told by your health care provider. Ask your health care provider what activities are safe for you.  Do not do any activities that make pain or swelling worse.  Do exercises as told by your health care provider. General instructions  Take over-the-counter and prescription medicines only as told by your health care provider.  Do not put your full weight on your knee until your health care provider  says you can.  Keep all follow-up visits as told by your health care provider. This is important. How is this prevented?  Wear supportive footwear that is appropriate for your athletic activity.  Avoid athletic activities that cause ankle pain or swelling.  Wear protective padding over your lower legs when playing contact sports.  Make sure your boots do not put extra pressure on the area just below your knees.  Do not sit cross-legged for long periods of time. Contact a health care provider if:  Your symptoms do not get better in 2-3 months.  The weakness or numbness in your leg or foot gets worse. This  information is not intended to replace advice given to you by your health care provider. Make sure you discuss any questions you have with your health care provider. Document Released: 10/17/2005 Document Revised: 06/21/2016 Document Reviewed: 09/04/2015 Elsevier Interactive Patient Education  [employer].

## 2018-07-09 NOTE — Assessment & Plan Note (Signed)
F/u w/Nephrology

## 2018-07-09 NOTE — Assessment & Plan Note (Signed)
See Cryo

## 2018-07-09 NOTE — Assessment & Plan Note (Addendum)
Discussed options See Dr Tamala Julian

## 2018-07-09 NOTE — Progress Notes (Signed)
Subjective:  Patient ID: Scott Oneal, male    DOB: 16-Jun-1941  Age: 77 y.o. MRN: [id number]  CC: No chief complaint on file.   HPI Scott Oneal presents for swelling in the feet. C/o lat tingling in the shin x 3 mo. C/o AKs  Outpatient Medications Prior to Visit  Medication Sig Dispense Refill  . aspirin 81 MG tablet Take 81 mg by mouth every evening.     . calcitRIOL (ROCALTROL) 0.25 MCG capsule TAKE 1 CAPSULE BY MOUTH DAILY. ANNUAL APPT DUE IN OCT MUST SEE MD FOR REFILLS 90 capsule 1  . carvedilol (COREG) 25 MG tablet TAKE 1 TABLET BY MOUTH TWICE A DAY WITH MEALS 180 tablet 3  . Cholecalciferol 2000 UNITS TABS Take 2,000 Units by mouth daily.     . clonazePAM (KLONOPIN) 0.25 MG disintegrating tablet Take 1 tablet (0.25 mg total) by mouth 2 (two) times daily as needed (anxiety). 60 tablet 1  . diclofenac sodium (VOLTAREN) 1 % GEL APPLY 4 G TOPICALLY 4 TIMES DAILY. 200 g 5  . levothyroxine (SYNTHROID, LEVOTHROID) 150 MCG tablet TAKE 1 TABLET BY MOUTH EVERY DAY 90 tablet 3  . Multiple Vitamin (MULTIVITAMIN) capsule Take 1 capsule by mouth daily.    . nitroGLYCERIN (NITROSTAT) 0.4 MG SL tablet Place 0.4 mg under the tongue every 5 (five) minutes as needed for chest pain.    . nortriptyline (PAMELOR) 25 MG capsule Take 1-2 capsules (25-50 mg total) by mouth at bedtime. 60 capsule 5  . olmesartan (BENICAR) 20 MG tablet TAKE 1 TABLET (20 MG TOTAL) BY MOUTH DAILY. 90 tablet 3  . sildenafil (VIAGRA) 100 MG tablet Take 100 mg by mouth daily as needed for erectile dysfunction.     . simvastatin (ZOCOR) 10 MG tablet Take 1 tablet (10 mg total) by mouth at bedtime. Annual appt due in July must see provider for future refills 90 tablet 0  . SYRINGE-NEEDLE, DISP, 3 ML (B-D 3CC LUER-LOK SYR 22GX1") 22G X 1" 3 ML MISC USE EVERY 14 DAYS AS DIRECTED 50 each 3  . testosterone cypionate (DEPOTESTOSTERONE CYPIONATE) 200 MG/ML injection INJECT 2ML INTO MUSCLE EVERY 2 WEEKS 12 mL 3  . methylPREDNISolone  (MEDROL DOSEPAK) 4 MG TBPK tablet As directed 21 tablet 0   No facility-administered medications prior to visit.     ROS: Review of Systems  Constitutional: Negative for appetite change, fatigue and unexpected weight change.  HENT: Negative for congestion, nosebleeds, sneezing, sore throat and trouble swallowing.   Eyes: Negative for itching and visual disturbance.  Respiratory: Negative for cough.   Cardiovascular: Positive for leg swelling. Negative for chest pain and palpitations.  Gastrointestinal: Negative for abdominal distention, blood in stool, diarrhea and nausea.  Genitourinary: Negative for frequency and hematuria.  Musculoskeletal: Negative for back pain, gait problem, joint swelling and neck pain.  Skin: Negative for rash.  Neurological: Positive for numbness. Negative for dizziness, tremors, speech difficulty and weakness.  Psychiatric/Behavioral: Negative for agitation, dysphoric mood and sleep disturbance. The patient is not nervous/anxious.     Objective:  BP 134/84 (BP Location: Left Arm, Patient Position: Sitting, Cuff Size: Normal)   Pulse 74   Temp 98.3 F (36.8 C) (Oral)   Ht 6' (1.829 m)   Wt 216 lb (98 kg)   SpO2 97%   BMI 29.29 kg/m   BP Readings from Last 3 Encounters:  07/09/18 134/84  05/15/18 128/82  04/26/18 132/74    Wt Readings from Last 3 Encounters:  07/09/18 216 lb (98 kg)  05/15/18 217 lb 9.6 oz (98.7 kg)  04/26/18 220 lb (99.8 kg)    Physical Exam  Constitutional: He is oriented to person, place, and time. He appears well-developed. No distress.  NAD  HENT:  Mouth/Throat: Oropharynx is clear and moist.  Eyes: Pupils are equal, round, and reactive to light. Conjunctivae are normal.  Neck: Normal range of motion. No JVD present. No thyromegaly present.  Cardiovascular: Normal rate, regular rhythm, normal heart sounds and intact distal pulses. Exam reveals no gallop and no friction rub.  No murmur heard. Pulmonary/Chest: Effort  normal and breath sounds normal. No respiratory distress. He has no wheezes. He has no rales. He exhibits no tenderness.  Abdominal: Soft. Bowel sounds are normal. He exhibits no distension and no mass. There is no tenderness. There is no rebound and no guarding.  Musculoskeletal: Normal range of motion. He exhibits edema. He exhibits no tenderness.  Lymphadenopathy:    He has no cervical adenopathy.  Neurological: He is alert and oriented to person, place, and time. He has normal reflexes. No cranial nerve deficit. He exhibits normal muscle tone. He displays a negative Romberg sign. Coordination and gait normal.  Skin: Skin is warm and dry. No rash noted.  Psychiatric: He has a normal mood and affect. His behavior is normal. Judgment and thought content normal.  trace edema B ankles Pinful L per head rrox AKs   Procedure Note :     Procedure : Cryosurgery   Indication:   Actinic keratosis(es)   Risks including unsuccessful procedure , bleeding, infection, bruising, scar, a need for a repeat  procedure and others were explained to the patient in detail as well as the benefits. Informed consent was obtained verbally.    5 lesion(s)  on scalp/face  was/were treated with liquid nitrogen on a Q-tip in a usual fasion . Band-Aid was applied and antibiotic ointment was given for a later use.   Tolerated well. Complications none.   Postprocedure instructions :     Keep the wounds clean. You can wash them with liquid soap and water. Pat dry with gauze or a Kleenex tissue  Before applying antibiotic ointment and a Band-Aid.   You need to report immediately  if  any signs of infection develop.     Lab Results  Component Value Date   WBC 9.0 11/01/2017   HGB 16.8 11/01/2017   HCT 50.9 11/01/2017   PLT 123.0 (L) 11/01/2017   GLUCOSE 101 (H) 11/29/2017   CHOL 118 05/18/2017   TRIG 82.0 05/18/2017   HDL 40.60 05/18/2017   LDLCALC 61 05/18/2017   ALT 17 05/18/2017   AST 21 05/18/2017    NA 141 11/29/2017   K 4.7 11/29/2017   CL 104 11/29/2017   CREATININE 2.02 (H) 11/29/2017   BUN 17 11/29/2017   CO2 32 11/29/2017   TSH 4.28 11/29/2017   PSA 2.91 05/18/2017   INR 1.20 11/26/2014   HGBA1C 5.9 03/15/2016   MICROALBUR 1.2 05/18/2017    Dg Pelvis 1-2 Views  Result Date: 11/29/2017 CLINICAL DATA:  Recent slip and fall with bilateral hip pain, initial encounter EXAM: PELVIS - 1-2 VIEW COMPARISON:  None. FINDINGS: Pelvic ring is intact. No acute fracture or dislocation is noted. Mild degenerative changes of the hip joints are seen. IMPRESSION: No acute fracture identified. Electronically Signed   By: Inez Catalina M.D.   On: 11/29/2017 19:58    Assessment & Plan:   There are  no diagnoses linked to this encounter.   No orders of the defined types were placed in this encounter.    Follow-up: No follow-ups on file.  Walker Kehr, MD

## 2018-07-10 ENCOUNTER — Other Ambulatory Visit: Payer: Self-pay | Admitting: Internal Medicine

## 2018-07-11 DIAGNOSIS — H353211 Exudative age-related macular degeneration, right eye, with active choroidal neovascularization: Secondary | ICD-10-CM | POA: Diagnosis not present

## 2018-07-12 ENCOUNTER — Encounter: Payer: Self-pay | Admitting: Internal Medicine

## 2018-08-01 ENCOUNTER — Other Ambulatory Visit: Payer: Self-pay | Admitting: Internal Medicine

## 2018-08-02 DIAGNOSIS — Z6841 Body Mass Index (BMI) 40.0 and over, adult: Secondary | ICD-10-CM | POA: Diagnosis not present

## 2018-08-02 DIAGNOSIS — R3989 Other symptoms and signs involving the genitourinary system: Secondary | ICD-10-CM | POA: Diagnosis not present

## 2018-08-02 DIAGNOSIS — N183 Chronic kidney disease, stage 3 (moderate): Secondary | ICD-10-CM | POA: Diagnosis not present

## 2018-08-02 DIAGNOSIS — I129 Hypertensive chronic kidney disease with stage 1 through stage 4 chronic kidney disease, or unspecified chronic kidney disease: Secondary | ICD-10-CM | POA: Diagnosis not present

## 2018-08-02 DIAGNOSIS — R319 Hematuria, unspecified: Secondary | ICD-10-CM | POA: Diagnosis not present

## 2018-08-05 ENCOUNTER — Other Ambulatory Visit: Payer: Self-pay | Admitting: Internal Medicine

## 2018-08-06 ENCOUNTER — Other Ambulatory Visit: Payer: Self-pay | Admitting: Internal Medicine

## 2018-08-06 MED ORDER — FUROSEMIDE 20 MG PO TABS: 20.0000 mg | ORAL_TABLET | Freq: Every day | ORAL | 1 refills | Status: DC | PRN

## 2018-08-06 NOTE — Addendum Note (Signed)
Addended by: Karren Cobble on: 08/06/2018 10:06 AM   Modules accepted: Orders

## 2018-09-10 ENCOUNTER — Encounter: Payer: Self-pay | Admitting: Internal Medicine

## 2018-09-10 ENCOUNTER — Ambulatory Visit (INDEPENDENT_AMBULATORY_CARE_PROVIDER_SITE_OTHER): Payer: PPO | Admitting: Internal Medicine

## 2018-09-10 VITALS — BP 132/80 | HR 62 | Temp 98.1°F | Ht 72.0 in | Wt 223.0 lb

## 2018-09-10 DIAGNOSIS — M5386 Other specified dorsopathies, lumbar region: Secondary | ICD-10-CM | POA: Insufficient documentation

## 2018-09-10 DIAGNOSIS — M7062 Trochanteric bursitis, left hip: Secondary | ICD-10-CM

## 2018-09-10 DIAGNOSIS — M5432 Sciatica, left side: Secondary | ICD-10-CM

## 2018-09-10 DIAGNOSIS — I10 Essential (primary) hypertension: Secondary | ICD-10-CM | POA: Diagnosis not present

## 2018-09-10 DIAGNOSIS — E034 Atrophy of thyroid (acquired): Secondary | ICD-10-CM

## 2018-09-10 DIAGNOSIS — M5430 Sciatica, unspecified side: Secondary | ICD-10-CM

## 2018-09-10 DIAGNOSIS — M7072 Other bursitis of hip, left hip: Secondary | ICD-10-CM | POA: Insufficient documentation

## 2018-09-10 MED ORDER — METHYLPREDNISOLONE ACETATE 80 MG/ML IJ SUSP: 80.0000 mg | Freq: Once | INTRAMUSCULAR | Status: DC

## 2018-09-10 MED ORDER — METHYLPREDNISOLONE ACETATE 80 MG/ML IJ SUSP
80.0000 mg | Freq: Once | INTRAMUSCULAR | Status: AC
  Administered 2018-09-10: 80 mg via INTRA_ARTICULAR

## 2018-09-10 NOTE — Patient Instructions (Signed)
Memory foam pillow  Body pillow or a pillow between the knees

## 2018-09-10 NOTE — Assessment & Plan Note (Signed)
F/u Amlodipine 2.5 mg/d, Coreg, Benicar Dr Clover Mealy Risks associated with Benicar treatment noncompliance were discussed. Compliance was encouraged.

## 2018-09-10 NOTE — Assessment & Plan Note (Signed)
Levothroid

## 2018-09-10 NOTE — Assessment & Plan Note (Signed)
Discussed.

## 2018-09-10 NOTE — Assessment & Plan Note (Signed)
Will inject Ortho f/u

## 2018-09-10 NOTE — Progress Notes (Signed)
Subjective:  Patient ID: Scott Oneal, male    DOB: [birth year]  Age: 77 y.o. MRN: [id number]  CC: No chief complaint on file.   HPI Scott Oneal presents for  L hip pain and down lateral leg - severe at times Sleeping on the left side  Outpatient Medications Prior to Visit  Medication Sig Dispense Refill  . aspirin 81 MG tablet Take 81 mg by mouth every evening.     . B-D 3CC LUER-LOK SYR 21GX1" 21G X 1" 3 ML MISC USE EVERY 14 DAYS AS DIRECTED 50 each 2  . calcitRIOL (ROCALTROL) 0.25 MCG capsule TAKE 1 CAPSULE BY MOUTH DAILY. ANNUAL APPT DUE IN OCT MUST SEE MD FOR REFILLS 90 capsule 1  . carvedilol (COREG) 25 MG tablet TAKE 1 TABLET BY MOUTH TWICE A DAY WITH MEALS 180 tablet 3  . Cholecalciferol 2000 UNITS TABS Take 2,000 Units by mouth daily.     . clonazePAM (KLONOPIN) 0.25 MG disintegrating tablet Take 1 tablet (0.25 mg total) by mouth 2 (two) times daily as needed (anxiety). 60 tablet 1  . diclofenac sodium (VOLTAREN) 1 % GEL APPLY 4 G TOPICALLY 4 TIMES DAILY. 200 g 5  . furosemide (LASIX) 20 MG tablet Take 1 tablet (20 mg total) by mouth daily as needed for edema (use rarely). 90 tablet 1  . levothyroxine (SYNTHROID, LEVOTHROID) 150 MCG tablet TAKE 1 TABLET BY MOUTH EVERY DAY 90 tablet 3  . Multiple Vitamin (MULTIVITAMIN) capsule Take 1 capsule by mouth daily.    . nitroGLYCERIN (NITROSTAT) 0.4 MG SL tablet Place 0.4 mg under the tongue every 5 (five) minutes as needed for chest pain.    . nortriptyline (PAMELOR) 25 MG capsule Take 1-2 capsules (25-50 mg total) by mouth at bedtime. 60 capsule 5  . olmesartan (BENICAR) 20 MG tablet TAKE 1 TABLET (20 MG TOTAL) BY MOUTH DAILY. 90 tablet 3  . sildenafil (VIAGRA) 100 MG tablet Take 100 mg by mouth daily as needed for erectile dysfunction.     . simvastatin (ZOCOR) 10 MG tablet Take 1 tablet (10 mg total) by mouth at bedtime. 90 tablet 3  . SYRINGE-NEEDLE, DISP, 3 ML (B-D 3CC LUER-LOK SYR 22GX1") 22G X 1" 3 ML MISC USE EVERY 14 DAYS AS  DIRECTED 50 each 3  . testosterone cypionate (DEPOTESTOSTERONE CYPIONATE) 200 MG/ML injection INJECT 2ML INTO MUSCLE EVERY 2 WEEKS 12 mL 3   No facility-administered medications prior to visit.     ROS: Review of Systems  Constitutional: Negative for appetite change, fatigue and unexpected weight change.  HENT: Negative for congestion, nosebleeds, sneezing, sore throat and trouble swallowing.   Eyes: Negative for itching and visual disturbance.  Respiratory: Negative for cough.   Cardiovascular: Negative for chest pain, palpitations and leg swelling.  Gastrointestinal: Negative for abdominal distention, blood in stool, diarrhea and nausea.  Genitourinary: Negative for frequency and hematuria.  Musculoskeletal: Positive for arthralgias, back pain and gait problem. Negative for joint swelling and neck pain.  Skin: Negative for rash.  Neurological: Negative for dizziness, tremors, speech difficulty and weakness.  Psychiatric/Behavioral: Negative for agitation, dysphoric mood, sleep disturbance and suicidal ideas. The patient is not nervous/anxious.     Objective:  BP 132/80 (BP Location: Right Arm, Patient Position: Sitting, Cuff Size: Normal)   Pulse 62   Temp 98.1 F (36.7 C) (Oral)   Ht 6' (1.829 m)   Wt 223 lb (101.2 kg)   SpO2 98%   BMI 30.24 kg/m  BP Readings from Last 3 Encounters:  09/10/18 132/80  07/09/18 134/84  05/15/18 128/82    Wt Readings from Last 3 Encounters:  09/10/18 223 lb (101.2 kg)  07/09/18 216 lb (98 kg)  05/15/18 217 lb 9.6 oz (98.7 kg)    Physical Exam  Constitutional: He is oriented to person, place, and time. He appears well-developed. No distress.  NAD  HENT:  Mouth/Throat: Oropharynx is clear and moist.  Eyes: Pupils are equal, round, and reactive to light. Conjunctivae are normal.  Neck: Normal range of motion. No JVD present. No thyromegaly present.  Cardiovascular: Normal rate, regular rhythm, normal heart sounds and intact distal  pulses. Exam reveals no gallop and no friction rub.  No murmur heard. Pulmonary/Chest: Effort normal and breath sounds normal. No respiratory distress. He has no wheezes. He has no rales. He exhibits no tenderness.  Abdominal: Soft. Bowel sounds are normal. He exhibits no distension and no mass. There is no tenderness. There is no rebound and no guarding.  Musculoskeletal: Normal range of motion. He exhibits tenderness. He exhibits no edema.  Lymphadenopathy:    He has no cervical adenopathy.  Neurological: He is alert and oriented to person, place, and time. He has normal reflexes. No cranial nerve deficit. He exhibits normal muscle tone. He displays a negative Romberg sign. Coordination and gait normal.  Skin: Skin is warm and dry. No rash noted.  Psychiatric: He has a normal mood and affect. His behavior is normal. Judgment and thought content normal.   L hip pain and down lateral leg - troch major Str leg (+) L    Procedure Note :     Procedure : Joint Injection,   L  hip   Indication:  Trochanteric bursitis with refractory  chronic pain.   Risks including unsuccessful procedure , bleeding, infection, bruising, skin atrophy, "steroid flare-up" and others were explained to the patient in detail as well as the benefits. Informed consent was obtained and signed.   Tthe patient was placed in a comfortable lateral decubitus position. The point of maximal tenderness was identified. Skin was prepped with Betadine and alcohol. Then, a 5 cc syringe with a 2 inch long 24-gauge needle was used for a bursa injection.. The needle was advanced  Into the bursa. I injected the bursa with 3 mL of 2% lidocaine and 80 mg of Depo-Medrol .  Band-Aid was applied.   Tolerated well. Complications: None. Good pain relief following the procedure.   Postprocedure instructions :       Lab Results  Component Value Date   WBC 9.0 11/01/2017   HGB 16.8 11/01/2017   HCT 50.9 11/01/2017   PLT 123.0 (L)  11/01/2017   GLUCOSE 101 (H) 11/29/2017   CHOL 118 05/18/2017   TRIG 82.0 05/18/2017   HDL 40.60 05/18/2017   LDLCALC 61 05/18/2017   ALT 17 05/18/2017   AST 21 05/18/2017   NA 141 11/29/2017   K 4.7 11/29/2017   CL 104 11/29/2017   CREATININE 2.02 (H) 11/29/2017   BUN 17 11/29/2017   CO2 32 11/29/2017   TSH 4.28 11/29/2017   PSA 2.91 05/18/2017   INR 1.20 11/26/2014   HGBA1C 5.9 03/15/2016   MICROALBUR 1.2 05/18/2017    Dg Pelvis 1-2 Views  Result Date: 11/29/2017 CLINICAL DATA:  Recent slip and fall with bilateral hip pain, initial encounter EXAM: PELVIS - 1-2 VIEW COMPARISON:  None. FINDINGS: Pelvic ring is intact. No acute fracture or dislocation is noted. Mild degenerative  changes of the hip joints are seen. IMPRESSION: No acute fracture identified. Electronically Signed   By: Inez Catalina M.D.   On: 11/29/2017 19:58    Assessment & Plan:   There are no diagnoses linked to this encounter.   No orders of the defined types were placed in this encounter.    Follow-up: No follow-ups on file.  Walker Kehr, MD

## 2018-09-19 DIAGNOSIS — H353211 Exudative age-related macular degeneration, right eye, with active choroidal neovascularization: Secondary | ICD-10-CM | POA: Diagnosis not present

## 2018-10-16 ENCOUNTER — Other Ambulatory Visit: Payer: Self-pay | Admitting: Internal Medicine

## 2018-11-13 ENCOUNTER — Ambulatory Visit (INDEPENDENT_AMBULATORY_CARE_PROVIDER_SITE_OTHER): Payer: PPO | Admitting: Internal Medicine

## 2018-11-13 ENCOUNTER — Encounter: Payer: Self-pay | Admitting: Internal Medicine

## 2018-11-13 VITALS — BP 128/74 | HR 75 | Temp 98.3°F | Ht 72.0 in | Wt 222.0 lb

## 2018-11-13 DIAGNOSIS — G8929 Other chronic pain: Secondary | ICD-10-CM

## 2018-11-13 DIAGNOSIS — M7062 Trochanteric bursitis, left hip: Secondary | ICD-10-CM

## 2018-11-13 DIAGNOSIS — M25552 Pain in left hip: Secondary | ICD-10-CM

## 2018-11-13 DIAGNOSIS — I2510 Atherosclerotic heart disease of native coronary artery without angina pectoris: Secondary | ICD-10-CM | POA: Diagnosis not present

## 2018-11-13 DIAGNOSIS — L570 Actinic keratosis: Secondary | ICD-10-CM | POA: Diagnosis not present

## 2018-11-13 DIAGNOSIS — I10 Essential (primary) hypertension: Secondary | ICD-10-CM | POA: Diagnosis not present

## 2018-11-13 DIAGNOSIS — N183 Chronic kidney disease, stage 3 unspecified: Secondary | ICD-10-CM

## 2018-11-13 DIAGNOSIS — M533 Sacrococcygeal disorders, not elsewhere classified: Secondary | ICD-10-CM

## 2018-11-13 DIAGNOSIS — M545 Low back pain: Secondary | ICD-10-CM

## 2018-11-13 DIAGNOSIS — F411 Generalized anxiety disorder: Secondary | ICD-10-CM

## 2018-11-13 MED ORDER — METHYLPREDNISOLONE ACETATE 80 MG/ML IJ SUSP
80.0000 mg | Freq: Once | INTRAMUSCULAR | Status: AC
  Administered 2018-11-13: 80 mg via INTRA_ARTICULAR

## 2018-11-13 MED ORDER — CLONAZEPAM 0.25 MG PO TBDP: 0.2500 mg | ORAL_TABLET | Freq: Two times a day (BID) | ORAL | 1 refills | Status: DC | PRN

## 2018-11-13 NOTE — Assessment & Plan Note (Signed)
Klonopin prn  Potential benefits of a long term benzodiazepines  use as well as potential risks  and complications were explained to the patient and were aknowledged.

## 2018-11-13 NOTE — Assessment & Plan Note (Signed)
See procedure Previous inj has helped

## 2018-11-13 NOTE — Assessment & Plan Note (Signed)
Amlodipine 2.5 mg/d, Coreg, Benicar Dr Clover Mealy Risks associated with Benicar treatment noncompliance were discussed. Compliance was encouraged.

## 2018-11-13 NOTE — Assessment & Plan Note (Signed)
See Cryo

## 2018-11-13 NOTE — Assessment & Plan Note (Signed)
Labs

## 2018-11-13 NOTE — Assessment & Plan Note (Signed)
No angina 

## 2018-11-13 NOTE — Assessment & Plan Note (Signed)
F/u w/Dr Tamala Julian Leg pillow, coccyx pillow

## 2018-11-13 NOTE — Progress Notes (Signed)
Subjective:  Patient ID: Scott Oneal, male    DOB: [birth year]  Age: 78 y.o. MRN: [id number]  CC: No chief complaint on file.   HPI Scott Oneal presents for LBP, knee pain, CAD, OA, DM f/u C/o AKs C/o L hip pain  Outpatient Medications Prior to Visit  Medication Sig Dispense Refill  . aspirin 81 MG tablet Take 81 mg by mouth every evening.     . B-D 3CC LUER-LOK SYR 21GX1" 21G X 1" 3 ML MISC USE EVERY 14 DAYS AS DIRECTED 50 each 2  . calcitRIOL (ROCALTROL) 0.25 MCG capsule TAKE 1 CAPSULE BY MOUTH DAILY. ANNUAL APPT DUE IN OCT MUST SEE MD FOR REFILLS 90 capsule 1  . carvedilol (COREG) 25 MG tablet TAKE 1 TABLET BY MOUTH TWICE A DAY WITH MEALS 180 tablet 3  . Cholecalciferol 2000 UNITS TABS Take 2,000 Units by mouth daily.     . diclofenac sodium (VOLTAREN) 1 % GEL APPLY 4 G TOPICALLY 4 TIMES DAILY. 200 g 5  . levothyroxine (SYNTHROID, LEVOTHROID) 150 MCG tablet TAKE 1 TABLET BY MOUTH EVERY DAY 90 tablet 3  . Multiple Vitamin (MULTIVITAMIN) capsule Take 1 capsule by mouth daily.    . nitroGLYCERIN (NITROSTAT) 0.4 MG SL tablet Place 0.4 mg under the tongue every 5 (five) minutes as needed for chest pain.    . nortriptyline (PAMELOR) 25 MG capsule Take 1-2 capsules (25-50 mg total) by mouth at bedtime. 60 capsule 5  . olmesartan (BENICAR) 20 MG tablet TAKE 1 TABLET (20 MG TOTAL) BY MOUTH DAILY. 90 tablet 3  . sildenafil (VIAGRA) 100 MG tablet Take 100 mg by mouth daily as needed for erectile dysfunction.     . simvastatin (ZOCOR) 10 MG tablet Take 1 tablet (10 mg total) by mouth at bedtime. 90 tablet 3  . testosterone cypionate (DEPOTESTOSTERONE CYPIONATE) 200 MG/ML injection INJECT 2ML INTO MUSCLE EVERY 2 WEEKS 12 mL 3  . clonazePAM (KLONOPIN) 0.25 MG disintegrating tablet Take 1 tablet (0.25 mg total) by mouth 2 (two) times daily as needed (anxiety). 60 tablet 1  . furosemide (LASIX) 20 MG tablet Take 1 tablet (20 mg total) by mouth daily as needed for edema (use rarely). 90 tablet 1    . SYRINGE-NEEDLE, DISP, 3 ML (B-D 3CC LUER-LOK SYR 22GX1") 22G X 1" 3 ML MISC USE EVERY 14 DAYS AS DIRECTED 50 each 3   No facility-administered medications prior to visit.     ROS: Review of Systems  Constitutional: Positive for fatigue. Negative for appetite change and unexpected weight change.  HENT: Negative for congestion, nosebleeds, sneezing, sore throat and trouble swallowing.   Eyes: Negative for itching and visual disturbance.  Respiratory: Negative for cough.   Cardiovascular: Negative for chest pain, palpitations and leg swelling.  Gastrointestinal: Negative for abdominal distention, blood in stool, diarrhea and nausea.  Genitourinary: Negative for frequency and hematuria.  Musculoskeletal: Positive for arthralgias, back pain and gait problem. Negative for joint swelling and neck pain.  Skin: Negative for rash.  Neurological: Negative for dizziness, tremors, speech difficulty and weakness.  Psychiatric/Behavioral: Negative for agitation, dysphoric mood, sleep disturbance and suicidal ideas. The patient is not nervous/anxious.     Objective:  BP 128/74 (BP Location: Left Arm, Patient Position: Sitting, Cuff Size: Large)   Pulse 75   Temp 98.3 F (36.8 C) (Oral)   Ht 6' (1.829 m)   Wt 222 lb (100.7 kg)   SpO2 95%   BMI 30.11 kg/m   BP  Readings from Last 3 Encounters:  11/13/18 128/74  09/10/18 132/80  07/09/18 134/84    Wt Readings from Last 3 Encounters:  11/13/18 222 lb (100.7 kg)  09/10/18 223 lb (101.2 kg)  07/09/18 216 lb (98 kg)    Physical Exam Constitutional:      General: He is not in acute distress.    Appearance: He is well-developed.     Comments: NAD  Eyes:     Conjunctiva/sclera: Conjunctivae normal.     Pupils: Pupils are equal, round, and reactive to light.  Neck:     Musculoskeletal: Normal range of motion.     Thyroid: No thyromegaly.     Vascular: No JVD.  Cardiovascular:     Rate and Rhythm: Normal rate and regular rhythm.      Heart sounds: Normal heart sounds. No murmur. No friction rub. No gallop.   Pulmonary:     Effort: Pulmonary effort is normal. No respiratory distress.     Breath sounds: Normal breath sounds. No wheezing or rales.  Chest:     Chest wall: No tenderness.  Abdominal:     General: Bowel sounds are normal. There is no distension.     Palpations: Abdomen is soft. There is no mass.     Tenderness: There is no abdominal tenderness. There is no guarding or rebound.  Musculoskeletal: Normal range of motion.        General: No tenderness.  Lymphadenopathy:     Cervical: No cervical adenopathy.  Skin:    General: Skin is warm and dry.     Findings: No rash.  Neurological:     Mental Status: He is alert and oriented to person, place, and time.     Cranial Nerves: No cranial nerve deficit.     Motor: No abnormal muscle tone.     Coordination: Coordination normal.     Gait: Gait normal.     Deep Tendon Reflexes: Reflexes are normal and symmetric.  Psychiatric:        Behavior: Behavior normal.        Thought Content: Thought content normal.        Judgment: Judgment normal.   AKs   L hip is tender    Procedure Note :     Procedure : Joint Injection,  L  hip   Indication:  Trochanteric bursitis with refractory  chronic pain.   Risks including unsuccessful procedure , bleeding, infection, bruising, skin atrophy, "steroid flare-up" and others were explained to the patient in detail as well as the benefits. Informed consent was obtained and signed.   Tthe patient was placed in a comfortable lateral decubitus position. The point of maximal tenderness was identified. Skin was prepped with Betadine and alcohol. Then, a 5 cc syringe with a 2 inch long 24-gauge needle was used for a bursa injection.. The needle was advanced  Into the bursa. I injected the bursa with 4 mL of 2% lidocaine and 80 mg of Depo-Medrol .  Band-Aid was applied.   Tolerated well. Complications: None. Good pain relief  following the procedure.   Postprocedure instructions :    A Band-Aid should be left on for 12 hours. Injection therapy is not a cure itself. It is used in conjunction with other modalities. You can use nonsteroidal anti-inflammatories like ibuprofen , hot and cold compresses. Rest is recommended in the next 24 hours. You need to report immediately  if fever, chills or any signs of infection develop.   Procedure Note :  Procedure : Cryosurgery   Indication: Actinic keratosis(es) R face x6   Risks including unsuccessful procedure , bleeding, infection, bruising, scar, a need for a repeat  procedure and others were explained to the patient in detail as well as the benefits. Informed consent was obtained verbally.    6 lesion(s)  On R face    was/were treated with liquid nitrogen on a Q-tip in a usual fasion . Band-Aid was applied and antibiotic ointment was given for a later use.   Tolerated well. Complications none.   Postprocedure instructions :     Keep the wounds clean. You can wash them with liquid soap and water. Pat dry with gauze or a Kleenex tissue  Before applying antibiotic ointment and a Band-Aid.   You need to report immediately  if  any signs of infection develop.     Lab Results  Component Value Date   WBC 9.0 11/01/2017   HGB 16.8 11/01/2017   HCT 50.9 11/01/2017   PLT 123.0 (L) 11/01/2017   GLUCOSE 101 (H) 11/29/2017   CHOL 118 05/18/2017   TRIG 82.0 05/18/2017   HDL 40.60 05/18/2017   LDLCALC 61 05/18/2017   ALT 17 05/18/2017   AST 21 05/18/2017   NA 141 11/29/2017   K 4.7 11/29/2017   CL 104 11/29/2017   CREATININE 2.02 (H) 11/29/2017   BUN 17 11/29/2017   CO2 32 11/29/2017   TSH 4.28 11/29/2017   PSA 2.91 05/18/2017   INR 1.20 11/26/2014   HGBA1C 5.9 03/15/2016   MICROALBUR 1.2 05/18/2017    Dg Pelvis 1-2 Views  Result Date: 11/29/2017 CLINICAL DATA:  Recent slip and fall with bilateral hip pain, initial encounter EXAM: PELVIS - 1-2 VIEW  COMPARISON:  None. FINDINGS: Pelvic ring is intact. No acute fracture or dislocation is noted. Mild degenerative changes of the hip joints are seen. IMPRESSION: No acute fracture identified. Electronically Signed   By: Inez Catalina M.D.   On: 11/29/2017 19:58    Assessment & Plan:   Diagnoses and all orders for this visit:  Chronic right-sided low back pain without sciatica  Coccyx pain  Generalized anxiety disorder  CKD (chronic kidney disease) stage 3, GFR 30-59 ml/min (HCC)  Essential hypertension  Coronary artery disease involving native coronary artery of native heart without angina pectoris  Other orders -     clonazePAM (KLONOPIN) 0.25 MG disintegrating tablet; Take 1 tablet (0.25 mg total) by mouth 2 (two) times daily as needed (anxiety).     Meds ordered this encounter  Medications  . clonazePAM (KLONOPIN) 0.25 MG disintegrating tablet    Sig: Take 1 tablet (0.25 mg total) by mouth 2 (two) times daily as needed (anxiety).    Dispense:  60 tablet    Refill:  1     Follow-up: No follow-ups on file.  Walker Kehr, MD

## 2018-11-13 NOTE — Assessment & Plan Note (Signed)
Leg pillow, coccyx pillow

## 2018-11-19 DIAGNOSIS — H353211 Exudative age-related macular degeneration, right eye, with active choroidal neovascularization: Secondary | ICD-10-CM | POA: Diagnosis not present

## 2018-12-09 ENCOUNTER — Other Ambulatory Visit: Payer: Self-pay | Admitting: Internal Medicine

## 2018-12-13 DIAGNOSIS — M545 Low back pain: Secondary | ICD-10-CM | POA: Diagnosis not present

## 2018-12-13 DIAGNOSIS — M5416 Radiculopathy, lumbar region: Secondary | ICD-10-CM | POA: Diagnosis not present

## 2018-12-13 DIAGNOSIS — M1711 Unilateral primary osteoarthritis, right knee: Secondary | ICD-10-CM | POA: Diagnosis not present

## 2018-12-21 DIAGNOSIS — M545 Low back pain: Secondary | ICD-10-CM | POA: Diagnosis not present

## 2019-01-01 DIAGNOSIS — M5136 Other intervertebral disc degeneration, lumbar region: Secondary | ICD-10-CM | POA: Diagnosis not present

## 2019-01-01 DIAGNOSIS — M545 Low back pain: Secondary | ICD-10-CM | POA: Diagnosis not present

## 2019-01-02 ENCOUNTER — Other Ambulatory Visit: Payer: Self-pay | Admitting: Internal Medicine

## 2019-01-08 ENCOUNTER — Other Ambulatory Visit: Payer: Self-pay

## 2019-01-08 ENCOUNTER — Encounter: Payer: Self-pay | Admitting: Physical Therapy

## 2019-01-08 ENCOUNTER — Ambulatory Visit: Payer: PPO | Attending: Orthopedic Surgery | Admitting: Physical Therapy

## 2019-01-08 DIAGNOSIS — M5416 Radiculopathy, lumbar region: Secondary | ICD-10-CM | POA: Diagnosis not present

## 2019-01-08 NOTE — Patient Instructions (Signed)
Lower Trunk Rotation Stretch   Keeping back flat and feet together, rotate knees to left side. Hold __10__ seconds. Repeat __5__ times per set. Do ____ sets per session. Do _2_ sessions per day.  Piriformis (Supine)  Cross legs, right on top. Gently pull other knee toward chest until stretch is felt in buttock/hip of top leg. Hold _30-60___ seconds. Repeat __3__ times per set. Do ____ sets per session. Do ___2_ sessions per day.  Hip Stretch  Put right ankle over left knee. Let right knee fall downward, but keep ankle in place. Feel the stretch in hip. May push down gently with hand to feel stretch. Hold _30-60___ seconds while counting out loud. Repeat with other leg. Repeat __3__ times. Do __2__ sessions per day.   Leg Extension (Hamstring)   Sit toward front edge of chair, with leg out straight, heel on floor OR ON STOOL, toes pointing toward body. Keeping back straight, bend forward at hip, until a stretch is felt. Hold 30-60 seconds. Repeat _3__ times. Repeat with other leg. Do __2-3_ sessions per day.   Madelyn Flavors, PT 01/08/19 10:08 AM [address] [address] [place], [place], [address] Phone: [phone]   Fax:  [phone]

## 2019-01-08 NOTE — Therapy (Signed)
[place], [place], [address] Phone: [phone]   Fax:  [phone]  Physical Therapy Evaluation  Patient Details  Name: Scott Oneal MRN: [id number] Date of Birth: 1941-06-07 Referring Provider (PT): Rolena Infante   Encounter Date: 01/08/2019  PT End of Session - 01/08/19 0931    Visit Number  1    Date for PT Re-Evaluation  03/05/19    PT Start Time  0931    PT Stop Time  1009    PT Time Calculation (min)  38 min    Activity Tolerance  Patient tolerated treatment well    Behavior During Therapy  [facility] for tasks assessed/performed       Past Medical History:  Diagnosis Date  . Allergy   . Anxiety   . Arthritis   . Asthma   . Benign neoplasm of colon   . Calculus of gallbladder without mention of cholecystitis   . Cataract    bilateral cateracts removed  . Celiac disease   . Cholelithiasis   . Coronary atherosclerosis of unspecified type of vessel, native or graft   . Diarrhea   . Esophageal reflux   . Glaucoma   . Gout, unspecified   . Hyperparathyroidism   . Long term (current) use of anticoagulants   . Loss of weight   . Old myocardial infarction   . Osteoporosis, unspecified   . Other malaise and fatigue   . Other specified cardiac dysrhythmias(427.89)   . Other testicular hypofunction   . Personal history of colonic polyps   . Pure hypercholesterolemia   . Renal insufficiency    chronic  . Unspecified asthma(493.90)   . Unspecified essential hypertension   . Unspecified hypothyroidism     Past Surgical History:  Procedure Laterality Date  . COLON SURGERY    . COLONOSCOPY    . COLONOSCOPY WITH PROPOFOL N/A 11/27/2014   Procedure: COLONOSCOPY WITH PROPOFOL;  Surgeon: Milus Banister, MD;  Location: [facility];  Service: Endoscopy;  Laterality: N/A;  . CORONARY ANGIOPLASTY    . CORONARY STENT PLACEMENT  2011   Cypher; in distal circumflex artery  . CYSTOSCOPY  1998  .  EYE SURGERY    . KNEE SURGERY     right  . SHOULDER SURGERY Left 9/14   Dr Shara Blazing  . UPPER GASTROINTESTINAL ENDOSCOPY    . VASCULAR SURGERY      There were no vitals filed for this visit.   Subjective Assessment - 01/08/19 0936    Subjective  Patient began having left hip and leg pain beginning about one year ago when he fell on the ice. Originally he just had pain in his back and now he has numbness into LLE and foot.  Difficult to get moving after sitting on hard surface. He reports his knee wants to give way sometimes. this occurs on stairs at times. Patient cares for his wife who has significant back pain and limitations. 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    Pertinent History  arthritis, 2 hernias, MI, HTN, DDD, anxiety, right knee surgery    How long can you sit comfortably?  1 hour    Diagnostic tests  MRI - spinal stenosis and DDD    Patient Stated Goals  to get rid of pain    Currently in Pain?  Yes    Pain Score  8     Pain Location  Hip    Pain  Orientation  Left    Pain Descriptors / Indicators  Shooting    Pain Type  Chronic pain    Pain Radiating Towards  LLE to foot    Pain Onset  More than a month ago    Pain Frequency  Constant    Aggravating Factors   a lot of activity    Pain Relieving Factors  nothing, uses a cushion with sitting, bolster between legs at night    Effect of Pain on Daily Activities  everything hurts         [facility] PT Assessment - 01/08/19 0001      Assessment   Medical Diagnosis  disc degeneration lumbar    Referring Provider (PT)  Rolena Infante    Onset Date/Surgical Date  12/29/17    Next MD Visit  3 months      Precautions   Precautions  None      Restrictions   Weight Bearing Restrictions  No      Balance Screen   Has the patient fallen in the past 6 months  No    Has the patient had a decrease in activity level because of a fear of falling?   No    Is the patient reluctant to leave their home because of a fear of falling?    No      Home Environment   Living Environment  Private residence    Additional Comments  split level with railings      Prior Function   Level of Independence  Independent    Vocation  Retired      Art therapist   Posture/Postural Control  Postural limitations    Postural Limitations  Rounded Shoulders;Forward head;Decreased lumbar lordosis;Posterior pelvic tilt      ROM / Strength   AROM / PROM / Strength  AROM;Strength      AROM   Overall AROM Comments  Lumbar limited by 50% due to tight HS, rotation 25% limited, full ext, SB limited bil 50% with pain on ipsilateral sides      Strength   Overall Strength Comments  BLE grossly 5/5 including ankle, except left hip flex 4+/5;      Flexibility   Soft Tissue Assessment /Muscle Length  yes    Hamstrings  marked bil tightness    Quadriceps  tight    ITB  +ober bil Left>Right    Piriformis  mod tightness bil    Quadratus Lumborum  left tight      Palpation   Palpation comment  marked tenderness in left gluteals, piriformis and QL      Special Tests   Other special tests  neg slump/SLR bil      Ambulation/Gait   Gait Comments  antalgic gait with decreased stance time LLE, decreased step length bil                Objective measurements completed on examination: See above findings.              PT Education - 01/08/19 1008    Education Details  HEP    Person(s) Educated  Patient    Methods  Explanation;Demonstration;Handout    Comprehension  Verbalized understanding;Returned demonstration       PT Short Term Goals - 01/08/19 1237      PT SHORT TERM GOAL #1   Title  Pt ind with HEP    Time  2    Period  Weeks    Status  New  Target Date  01/22/19        PT Long Term Goals - 01/08/19 1244      PT LONG TERM GOAL #1   Title  Decreased pain in the left hip to 2/10 with ADLS    Time  8    Period  Weeks    Status  New    Target Date  03/05/19      PT LONG TERM GOAL #2   Title   Patient to report no LLE numbness    Time  8    Period  Weeks    Status  New      PT LONG TERM GOAL #3   Title  Patient to report decreased incidence of leg giving way by 75% or more.    Time  8    Period  Weeks    Status  New      PT LONG TERM GOAL #4   Title  Patient to report increased sitting tolerance without cushion to 30 minutes.    Time  8    Period  Weeks    Status  New             Plan - 01/08/19 1011    Clinical Impression Statement  Patient presents with c/o left hip and leg pain for one year after he fell on the ice. He now complains of pain and numbness in LLE to toes. He has limited ROM in the lumbar spine and hips which appear due to muscular tightness. He has mild strength deficits in his left hip. His gait is antalgic and he reports episodes of his left knee giving way, especially on stairs which he has at home. He has to sit on a cushion. He is also responsible for all house hold chores as his wife is physically limited.    Personal Factors and Comorbidities  Age;Comorbidity 3+    Comorbidities  arthritis, 2 hernias, MI, HTN, DDD, anxiety, right knee surgery    Examination-Activity Limitations  Stairs;Sit;Caring for Others    Clinical Decision Making  Low    Rehab Potential  Good    PT Frequency  2x / week    PT Duration  8 weeks    PT Treatment/Interventions  ADLs/Self Care Home Management;Cryotherapy;Moist Heat;Traction;Electrical Stimulation;Ultrasound;Therapeutic exercise;Therapeutic activities;Neuromuscular re-education;Dry needling;Manual techniques;Taping    PT Next Visit Plan  lumbar and hip flexibility; STW/MFR/DN to right gluteals; try traction    PT Home Exercise Plan  LTR, HS and fig 4 seated stretch    Consulted and Agree with Plan of Care  Patient       Patient will benefit from skilled therapeutic intervention in order to improve the following deficits and impairments:  Abnormal gait, Pain, Postural dysfunction, Decreased range of motion,  Decreased strength, Impaired flexibility  Visit Diagnosis: Radiculopathy, lumbar region - Plan: PT plan of care cert/re-cert     Problem List Patient Active Problem List   Diagnosis Date Noted  . Coccyx pain 11/13/2018  . Hip bursitis, left 09/10/2018  . Sciatica 09/10/2018  . Peroneal neuropathy at knee, left 07/09/2018  . CKD (chronic kidney disease) stage 3, GFR 30-59 ml/min (HCC) 05/14/2018  . Macular degeneration 04/26/2018  . Bilateral buttock pain 11/29/2017  . Cough 09/15/2017  . Bursitis of hip, right 07/18/2017  . Ischial bursitis 05/17/2017  . Asthmatic bronchitis 05/17/2017  . Elbow pain, left 11/21/2016  . Irregular heart beat 09/27/2016  . Hiccups 07/14/2016  . Edema 06/06/2016  . Insomnia 06/06/2016  .  Thigh pain 03/15/2016  . Tinnitus 09/15/2015  . Cramps of lower extremity 09/15/2015  . UTI (urinary tract infection) 06/15/2015  . Umbilical hernia 09/62/8366  . Lower GI bleed 11/26/2014  . Rectal bleeding 11/25/2014  . History of colonic polyps 09/12/2014  . Left foot pain 08/11/2014  . Right foot pain 06/10/2014  . Oral candidiasis 05/15/2014  . Rash 04/30/2014  . Low back pain 01/29/2014  . Anxiety disorder 11/27/2013  . Arthralgia 09/30/2013  . Preop exam for internal medicine 06/19/2013  . Preoperative clearance 06/19/2013  . Urinary frequency 12/12/2012  . Actinic keratoses 12/12/2012  . Thrombocytopenia ([place]) 11/19/2012  . Well adult exam 09/10/2012  . Shoulder pain, left 09/10/2012  . Warts 09/10/2012  . Headache(784.0) 11/29/2011  . Hyperkalemia 08/01/2011  . Neuropathy of hand 08/01/2011  . Numbness 04/28/2011  . Fatigue 04/28/2011  . Cellulitis of trunk 04/21/2011  . Chalazion 07/07/2010  . Disorder resulting from impaired renal function 03/10/2010  . Neoplasm of uncertain behavior of skin 11/09/2009  . PSA, INCREASED 11/09/2009  . HYPERCHOLESTEROLEMIA 08/31/2009  . Hypogonadism in male 07/03/2008  . BRADYCARDIA 07/03/2008  .  PERSONAL HX COLONIC POLYPS 07/02/2008  . OSTEOPOROSIS 06/03/2008  . CHOLELITHIASIS 04/29/2008  . CALCU GALLBLADD W/O MENTION CHOLECYST/OBST 04/29/2008  . Celiac disease 04/29/2008  . WEIGHT LOSS 04/08/2008  . GERD (gastroesophageal reflux disease) 04/02/2008  . TUBULOVILLOUS ADENOMA, COLON 04/01/2008  . Moderate persistent asthma without complication 29/47/6546  . Diarrhea 01/31/2008  . Hypothyroidism 08/11/2007  . Gout 08/11/2007  . Essential hypertension 08/11/2007  . MYOCARDIAL INFARCTION, HX OF 08/11/2007  . CAD (coronary artery disease) 08/11/2007    Madelyn Flavors PT 01/08/2019, 1:04 PM  [place] [address] [place], [place], [address] Phone: [phone]   Fax:  [phone]  Name: DORIS MCGILVERY MRN: [id number] Date of Birth: [birth year]

## 2019-01-09 DIAGNOSIS — H353121 Nonexudative age-related macular degeneration, left eye, early dry stage: Secondary | ICD-10-CM | POA: Diagnosis not present

## 2019-01-09 DIAGNOSIS — H35031 Hypertensive retinopathy, right eye: Secondary | ICD-10-CM | POA: Diagnosis not present

## 2019-01-09 DIAGNOSIS — H353211 Exudative age-related macular degeneration, right eye, with active choroidal neovascularization: Secondary | ICD-10-CM | POA: Diagnosis not present

## 2019-01-11 ENCOUNTER — Ambulatory Visit: Payer: PPO | Admitting: Physical Therapy

## 2019-01-11 ENCOUNTER — Encounter: Payer: Self-pay | Admitting: Physical Therapy

## 2019-01-11 ENCOUNTER — Other Ambulatory Visit: Payer: Self-pay

## 2019-01-11 DIAGNOSIS — M5416 Radiculopathy, lumbar region: Secondary | ICD-10-CM | POA: Diagnosis not present

## 2019-01-11 NOTE — Therapy (Signed)
[place], [place], [address] Phone: [phone]   Fax:  [phone]  Physical Therapy Treatment  Patient Details  Name: Scott Oneal MRN: [id number] Date of Birth: [birth year] Referring Provider (PT): Rolena Infante   Encounter Date: 01/11/2019  PT End of Session - 01/11/19 1211    Visit Number  2    Date for PT Re-Evaluation  03/05/19    PT Start Time  1100    PT Stop Time  1150    PT Time Calculation (min)  50 min    Activity Tolerance  Patient tolerated treatment well    Behavior During Therapy  [facility] for tasks assessed/performed       Past Medical History:  Diagnosis Date   Allergy    Anxiety    Arthritis    Asthma    Benign neoplasm of colon    Calculus of gallbladder without mention of cholecystitis    Cataract    bilateral cateracts removed   Celiac disease    Cholelithiasis    Coronary atherosclerosis of unspecified type of vessel, native or graft    Diarrhea    Esophageal reflux    Glaucoma    Gout, unspecified    Hyperparathyroidism    Long term (current) use of anticoagulants    Loss of weight    Old myocardial infarction    Osteoporosis, unspecified    Other malaise and fatigue    Other specified cardiac dysrhythmias(427.89)    Other testicular hypofunction    Personal history of colonic polyps    Pure hypercholesterolemia    Renal insufficiency    chronic   Unspecified asthma(493.90)    Unspecified essential hypertension    Unspecified hypothyroidism     Past Surgical History:  Procedure Laterality Date   COLON SURGERY     COLONOSCOPY     COLONOSCOPY WITH PROPOFOL N/A 11/27/2014   Procedure: COLONOSCOPY WITH PROPOFOL;  Surgeon: Milus Banister, MD;  Location: [facility] ENDOSCOPY;  Service: Endoscopy;  Laterality: N/A;   CORONARY ANGIOPLASTY     CORONARY STENT PLACEMENT  2011   Cypher; in distal circumflex artery   CYSTOSCOPY  1998    EYE SURGERY     KNEE SURGERY     right   SHOULDER SURGERY Left 9/14   Dr Shara Blazing   UPPER GASTROINTESTINAL ENDOSCOPY     VASCULAR SURGERY      There were no vitals filed for this visit.  Subjective Assessment - 01/11/19 1202    Subjective  Arrives with report of ongoing low back pain and Left LE radicular symptoms that intermittently will cause LE weakness.  Often awakens with increased Left LE symptoms.  Is a Left side sleeper.  HEP without any significant change thus far.                       [place] Adult PT Treatment/Exercise - 01/11/19 0001      Exercises   Exercises  Lumbar      Lumbar Exercises: Stretches   Passive Hamstring Stretch  Right;Left;3 reps;30 seconds    Single Knee to Chest Stretch  Left;Right;5 reps;10 seconds    Double Knee to Chest Stretch  5 reps;10 seconds    Lower Trunk Rotation  5 reps;10 seconds    Pelvic Tilt  15 reps;5 seconds    ITB Stretch  Left;Right;3 reps;30 seconds    Piriformis Stretch  3 reps;Right;Left;30 seconds  Figure 4 Stretch  3 reps;30 seconds;Supine;With overpressure      Lumbar Exercises: Seated   Other Seated Lumbar Exercises  flexion in sitting 10x 5" hold      Lumbar Exercises: Supine   Pelvic Tilt  15 reps;5 seconds      Manual Therapy   Manual Therapy  Manual Traction               PT Short Term Goals - 01/08/19 1237      PT SHORT TERM GOAL #1   Title  Pt ind with HEP    Time  2    Period  Weeks    Status  New    Target Date  01/22/19        PT Long Term Goals - 01/08/19 1244      PT LONG TERM GOAL #1   Title  Decreased pain in the left hip to 2/10 with ADLS    Time  8    Period  Weeks    Status  New    Target Date  03/05/19      PT LONG TERM GOAL #2   Title  Patient to report no LLE numbness    Time  8    Period  Weeks    Status  New      PT LONG TERM GOAL #3   Title  Patient to report decreased incidence of leg giving way by 75% or more.    Time  8    Period  Weeks     Status  New      PT LONG TERM GOAL #4   Title  Patient to report increased sitting tolerance without cushion to 30 minutes.    Time  8    Period  Weeks    Status  New            Plan - 01/11/19 1211    Clinical Impression Statement  Decreased LE flexibility throughout.  Good response to L/S flexion biased movement patterns, leaving clinic without LB or Left LE pain complaints.  Does have difficulty performing/ maintaining neutral pelvis d/t 2x hernia.  Possible Si dysfunction as well per description of a fall that started his complaints       Patient will benefit from skilled therapeutic intervention in order to improve the following deficits and impairments:     Visit Diagnosis: Radiculopathy, lumbar region     Problem List Patient Active Problem List   Diagnosis Date Noted   Coccyx pain 11/13/2018   Hip bursitis, left 09/10/2018   Sciatica 09/10/2018   Peroneal neuropathy at knee, left 07/09/2018   CKD (chronic kidney disease) stage 3, GFR 30-59 ml/min (HCC) 05/14/2018   Macular degeneration 04/26/2018   Bilateral buttock pain 11/29/2017   Cough 09/15/2017   Bursitis of hip, right 07/18/2017   Ischial bursitis 05/17/2017   Asthmatic bronchitis 05/17/2017   Elbow pain, left 11/21/2016   Irregular heart beat 09/27/2016   Hiccups 07/14/2016   Edema 06/06/2016   Insomnia 06/06/2016   Thigh pain 03/15/2016   Tinnitus 09/15/2015   Cramps of lower extremity 09/15/2015   UTI (urinary tract infection) 50/35/4656   Umbilical hernia 81/27/5170   Lower GI bleed 11/26/2014   Rectal bleeding 11/25/2014   History of colonic polyps 09/12/2014   Left foot pain 08/11/2014   Right foot pain 06/10/2014   Oral candidiasis 05/15/2014   Rash 04/30/2014   Low back pain 01/29/2014   Anxiety disorder 11/27/2013  Arthralgia 09/30/2013   Preop exam for internal medicine 06/19/2013   Preoperative clearance 06/19/2013   Urinary frequency  12/12/2012   Actinic keratoses 12/12/2012   Thrombocytopenia ([place]) 11/19/2012   Well adult exam 09/10/2012   Shoulder pain, left 09/10/2012   Warts 09/10/2012   Headache(784.0) 11/29/2011   Hyperkalemia 08/01/2011   Neuropathy of hand 08/01/2011   Numbness 04/28/2011   Fatigue 04/28/2011   Cellulitis of trunk 04/21/2011   Chalazion 07/07/2010   Disorder resulting from impaired renal function 03/10/2010   Neoplasm of uncertain behavior of skin 11/09/2009   PSA, INCREASED 11/09/2009   HYPERCHOLESTEROLEMIA 08/31/2009   Hypogonadism in male 07/03/2008   BRADYCARDIA 07/03/2008   PERSONAL HX COLONIC POLYPS 07/02/2008   OSTEOPOROSIS 06/03/2008   CHOLELITHIASIS 04/29/2008   CALCU GALLBLADD W/O MENTION CHOLECYST/OBST 04/29/2008   Celiac disease 04/29/2008   WEIGHT LOSS 04/08/2008   GERD (gastroesophageal reflux disease) 04/02/2008   TUBULOVILLOUS ADENOMA, COLON 04/01/2008   Moderate persistent asthma without complication 85/50/1586   Diarrhea 01/31/2008   Hypothyroidism 08/11/2007   Gout 08/11/2007   Essential hypertension 08/11/2007   MYOCARDIAL INFARCTION, HX OF 08/11/2007   CAD (coronary artery disease) 08/11/2007    Olean Ree, PTA 01/11/2019, 12:14 PM  [place] [address] [place], [place], [address] Phone: [phone]   Fax:  [phone]  Name: Scott Oneal MRN: [id number] Date of Birth: [birth year]

## 2019-01-15 ENCOUNTER — Ambulatory Visit: Payer: PPO | Admitting: Physical Therapy

## 2019-01-15 ENCOUNTER — Other Ambulatory Visit: Payer: Self-pay

## 2019-01-15 DIAGNOSIS — M5416 Radiculopathy, lumbar region: Secondary | ICD-10-CM

## 2019-01-15 NOTE — Therapy (Signed)
[address] [place], [place], [address] Phone: [phone]   Fax:  [phone]  Physical Therapy Treatment  Patient Details  Name: Scott Oneal MRN: [id number] Date of Birth: 05-21-1941 Referring Provider (PT): Rolena Infante   Encounter Date: 01/15/2019  PT End of Session - 01/15/19 1519    Visit Number  3    Date for PT Re-Evaluation  03/05/19    PT Start Time  4235    PT Stop Time  1611    PT Time Calculation (min)  56 min    Activity Tolerance  Patient tolerated treatment well    Behavior During Therapy  [facility] for tasks assessed/performed       Past Medical History:  Diagnosis Date  . Allergy   . Anxiety   . Arthritis   . Asthma   . Benign neoplasm of colon   . Calculus of gallbladder without mention of cholecystitis   . Cataract    bilateral cateracts removed  . Celiac disease   . Cholelithiasis   . Coronary atherosclerosis of unspecified type of vessel, native or graft   . Diarrhea   . Esophageal reflux   . Glaucoma   . Gout, unspecified   . Hyperparathyroidism   . Long term (current) use of anticoagulants   . Loss of weight   . Old myocardial infarction   . Osteoporosis, unspecified   . Other malaise and fatigue   . Other specified cardiac dysrhythmias(427.89)   . Other testicular hypofunction   . Personal history of colonic polyps   . Pure hypercholesterolemia   . Renal insufficiency    chronic  . Unspecified asthma(493.90)   . Unspecified essential hypertension   . Unspecified hypothyroidism     Past Surgical History:  Procedure Laterality Date  . COLON SURGERY    . COLONOSCOPY    . COLONOSCOPY WITH PROPOFOL N/A 11/27/2014   Procedure: COLONOSCOPY WITH PROPOFOL;  Surgeon: Milus Banister, MD;  Location: [facility];  Service: Endoscopy;  Laterality: N/A;  . CORONARY ANGIOPLASTY    . CORONARY STENT PLACEMENT  2011   Cypher; in distal circumflex artery  . CYSTOSCOPY  1998  .  EYE SURGERY    . KNEE SURGERY     right  . SHOULDER SURGERY Left 9/14   Dr Shara Blazing  . UPPER GASTROINTESTINAL ENDOSCOPY    . VASCULAR SURGERY      There were no vitals filed for this visit.  Subjective Assessment - 01/15/19 1603    Subjective  patient reports pain is a little better. He has been lifting and spreading fertilizer and lyme in the past few days.    Pertinent History  arthritis, 2 hernias, MI, HTN, DDD, anxiety, right knee surgery    Patient Stated Goals  to get rid of pain    Currently in Pain?  Yes    Pain Score  6     Pain Orientation  Left                       [facility] Adult PT Treatment/Exercise - 01/15/19 0001      Lumbar Exercises: Stretches   Passive Hamstring Stretch  Right;Left;2 reps;60 seconds    Single Knee to Chest Stretch  Right;Left;1 rep;60 seconds    Double Knee to Chest Stretch  5 reps;10 seconds    Lower Trunk Rotation  5 reps;20 seconds    Hip Flexor Stretch  Right;Left;1 rep;60  seconds    Hip Flexor Stretch Limitations  off bed with overpressure    Other Lumbar Stretch Exercise  attempted SDLY over bolster bil = no relief      Lumbar Exercises: Aerobic   Nustep  L5 x 5       Modalities   Modalities  Traction      Traction   Type of Traction  Lumbar    Min (lbs)  85    Max (lbs)  85    Hold Time  --    Rest Time  --    Time  15             PT Education - 01/15/19 1559    Education Details  HEP    Person(s) Educated  Patient    Methods  Explanation;Handout;Demonstration    Comprehension  Verbalized understanding;Returned demonstration       PT Short Term Goals - 01/08/19 1237      PT SHORT TERM GOAL #1   Title  Pt ind with HEP    Time  2    Period  Weeks    Status  New    Target Date  01/22/19        PT Long Term Goals - 01/08/19 1244      PT LONG TERM GOAL #1   Title  Decreased pain in the left hip to 2/10 with ADLS    Time  8    Period  Weeks    Status  New    Target Date  03/05/19      PT  LONG TERM GOAL #2   Title  Patient to report no LLE numbness    Time  8    Period  Weeks    Status  New      PT LONG TERM GOAL #3   Title  Patient to report decreased incidence of leg giving way by 75% or more.    Time  8    Period  Weeks    Status  New      PT LONG TERM GOAL #4   Title  Patient to report increased sitting tolerance without cushion to 30 minutes.    Time  8    Period  Weeks    Status  New            Plan - 01/15/19 1604    Clinical Impression Statement  Patient tolerating stretches well. Thomas stretch added to [employer]. Initial trial of traction at 85# completed with good response including no numbness in LLE.   PT Treatment/Interventions  ADLs/Self Care Home Management;Cryotherapy;Moist Heat;Traction;Electrical Stimulation;Ultrasound;Therapeutic exercise;Therapeutic activities;Neuromuscular re-education;Dry needling;Manual techniques;Taping    PT Next Visit Plan  lumbar and hip flexibility; STW/MFR/DN to right gluteals; increase traction pull as tolerated (pt weight 220#)    PT Home Exercise Plan  LTR, HS and fig 4 seated stretch, thomas, SKTC, DKTC       Patient will benefit from skilled therapeutic intervention in order to improve the following deficits and impairments:  Abnormal gait, Pain, Postural dysfunction, Decreased range of motion, Decreased strength, Impaired flexibility  Visit Diagnosis: Radiculopathy, lumbar region     Problem List Patient Active Problem List   Diagnosis Date Noted  . Coccyx pain 11/13/2018  . Hip bursitis, left 09/10/2018  . Sciatica 09/10/2018  . Peroneal neuropathy at knee, left 07/09/2018  . CKD (chronic kidney disease) stage 3, GFR 30-59 ml/min (HCC) 05/14/2018  . Macular degeneration 04/26/2018  . Bilateral buttock  pain 11/29/2017  . Cough 09/15/2017  . Bursitis of hip, right 07/18/2017  . Ischial bursitis 05/17/2017  . Asthmatic bronchitis 05/17/2017  . Elbow pain, left 11/21/2016  . Irregular heart beat  09/27/2016  . Hiccups 07/14/2016  . Edema 06/06/2016  . Insomnia 06/06/2016  . Thigh pain 03/15/2016  . Tinnitus 09/15/2015  . Cramps of lower extremity 09/15/2015  . UTI (urinary tract infection) 06/15/2015  . Umbilical hernia 48/27/0786  . Lower GI bleed 11/26/2014  . Rectal bleeding 11/25/2014  . History of colonic polyps 09/12/2014  . Left foot pain 08/11/2014  . Right foot pain 06/10/2014  . Oral candidiasis 05/15/2014  . Rash 04/30/2014  . Low back pain 01/29/2014  . Anxiety disorder 11/27/2013  . Arthralgia 09/30/2013  . Preop exam for internal medicine 06/19/2013  . Preoperative clearance 06/19/2013  . Urinary frequency 12/12/2012  . Actinic keratoses 12/12/2012  . Thrombocytopenia ([place]) 11/19/2012  . Well adult exam 09/10/2012  . Shoulder pain, left 09/10/2012  . Warts 09/10/2012  . Headache(784.0) 11/29/2011  . Hyperkalemia 08/01/2011  . Neuropathy of hand 08/01/2011  . Numbness 04/28/2011  . Fatigue 04/28/2011  . Cellulitis of trunk 04/21/2011  . Chalazion 07/07/2010  . Disorder resulting from impaired renal function 03/10/2010  . Neoplasm of uncertain behavior of skin 11/09/2009  . PSA, INCREASED 11/09/2009  . HYPERCHOLESTEROLEMIA 08/31/2009  . Hypogonadism in male 07/03/2008  . BRADYCARDIA 07/03/2008  . PERSONAL HX COLONIC POLYPS 07/02/2008  . OSTEOPOROSIS 06/03/2008  . CHOLELITHIASIS 04/29/2008  . CALCU GALLBLADD W/O MENTION CHOLECYST/OBST 04/29/2008  . Celiac disease 04/29/2008  . WEIGHT LOSS 04/08/2008  . GERD (gastroesophageal reflux disease) 04/02/2008  . TUBULOVILLOUS ADENOMA, COLON 04/01/2008  . Moderate persistent asthma without complication 75/44/9201  . Diarrhea 01/31/2008  . Hypothyroidism 08/11/2007  . Gout 08/11/2007  . Essential hypertension 08/11/2007  . MYOCARDIAL INFARCTION, HX OF 08/11/2007  . CAD (coronary artery disease) 08/11/2007    Madelyn Flavors PT 01/15/2019, 4:21 PM  [address] [place], [place], [address] Phone: [phone]   Fax:  [phone]  Name: Scott Oneal MRN: [id number] Date of Birth: [birth year]

## 2019-01-15 NOTE — Patient Instructions (Addendum)
Knee to Chest Stretch: Unilateral    With hand behind right knee, pull knee in to chest until a comfortable stretch is felt in lower back and buttocks. Keep back relaxed. Hold _60___ seconds. Repeat _3___ times per set. Do ____ sets per session. Do __2__ sessions per day.   Double Knee to Chest (Flexion)    Gently pull both knees toward chest. Feel stretch in lower back or buttock area. Breathing deeply, Hold __10__ seconds. Repeat __5__ times. Do __2__ sessions per day.   Hip Flexors (Supine)    Lie with both legs bent over edge of firm surface. To stretch left hip, bring opposite knee to chest. Apply downward pressure to leg hanging over edge. Do not allow hips to roll up. Do not let knees change position. Hold __30-60__ seconds. Repeat _3___ times. Do _2___ sessions per day. CAUTION: Stretch should be gentle, steady and slow.  Madelyn Flavors, PT 01/15/19 3:58 PM [address] [place], [place], [address] Phone: [phone]   Fax:  [phone]   Copyright  VHI. All rights reserved.

## 2019-01-18 ENCOUNTER — Ambulatory Visit: Payer: PPO

## 2019-01-22 ENCOUNTER — Ambulatory Visit: Payer: PPO

## 2019-01-23 ENCOUNTER — Other Ambulatory Visit: Payer: Self-pay

## 2019-01-23 ENCOUNTER — Ambulatory Visit: Payer: PPO | Admitting: Physical Therapy

## 2019-01-23 ENCOUNTER — Encounter: Payer: Self-pay | Admitting: Physical Therapy

## 2019-01-23 DIAGNOSIS — M5416 Radiculopathy, lumbar region: Secondary | ICD-10-CM

## 2019-01-23 NOTE — Therapy (Signed)
[place] [address] [place], [place], [address] Phone: [phone]   Fax:  [phone]  Physical Therapy Treatment  Patient Details  Name: Scott Oneal MRN: [id number] Date of Birth: Sep 04, 1941 Referring Provider (PT): Rolena Infante   Encounter Date: 01/23/2019  PT End of Session - 01/23/19 1141    Visit Number  4    Date for PT Re-Evaluation  03/05/19    PT Start Time  1059    PT Stop Time  1155    PT Time Calculation (min)  56 min    Activity Tolerance  Patient tolerated treatment well    Behavior During Therapy  [facility] for tasks assessed/performed       Past Medical History:  Diagnosis Date  . Allergy   . Anxiety   . Arthritis   . Asthma   . Benign neoplasm of colon   . Calculus of gallbladder without mention of cholecystitis   . Cataract    bilateral cateracts removed  . Celiac disease   . Cholelithiasis   . Coronary atherosclerosis of unspecified type of vessel, native or graft   . Diarrhea   . Esophageal reflux   . Glaucoma   . Gout, unspecified   . Hyperparathyroidism   . Long term (current) use of anticoagulants   . Loss of weight   . Old myocardial infarction   . Osteoporosis, unspecified   . Other malaise and fatigue   . Other specified cardiac dysrhythmias(427.89)   . Other testicular hypofunction   . Personal history of colonic polyps   . Pure hypercholesterolemia   . Renal insufficiency    chronic  . Unspecified asthma(493.90)   . Unspecified essential hypertension   . Unspecified hypothyroidism     Past Surgical History:  Procedure Laterality Date  . COLON SURGERY    . COLONOSCOPY    . COLONOSCOPY WITH PROPOFOL N/A 11/27/2014   Procedure: COLONOSCOPY WITH PROPOFOL;  Surgeon: Milus Banister, MD;  Location: [facility];  Service: Endoscopy;  Laterality: N/A;  . CORONARY ANGIOPLASTY    . CORONARY STENT PLACEMENT  2011   Cypher; in distal circumflex artery  . CYSTOSCOPY  1998  .  EYE SURGERY    . KNEE SURGERY     right  . SHOULDER SURGERY Left 9/14   Dr Shara Blazing  . UPPER GASTROINTESTINAL ENDOSCOPY    . VASCULAR SURGERY      There were no vitals filed for this visit.  Subjective Assessment - 01/23/19 1105    Subjective  Pt reports that he felt good Friday went to hep his son build and stain pick nick table, and cut shrubs. He reports L hip pain that started Saturday night    Currently in Pain?  Yes    Pain Score  8     Pain Location  Hip    Pain Orientation  Right                       [facility] Adult PT Treatment/Exercise - 01/23/19 0001      Lumbar Exercises: Stretches   Passive Hamstring Stretch  Right;Left;4 reps;10 seconds    Single Knee to Chest Stretch  Right;Left;3 reps;10 seconds    Piriformis Stretch  Right;Left;3 reps;10 seconds      Lumbar Exercises: Aerobic   UBE (Upper Arm Bike)  L3 2 min each way    Nustep  L5 x 5  Lumbar Exercises: Machines for Strengthening   Cybex Knee Extension  10lb 2x10     Cybex Knee Flexion  25lb 2x10     Leg Press  20lb 2x10     Other Lumbar Machine Exercise  Rows & Lats 25lb 2x10       Modalities   Modalities  Traction      Traction   Type of Traction  Lumbar    Min (lbs)  75    Max (lbs)  85    Hold Time  60    Rest Time  20    Time  15               PT Short Term Goals - 01/23/19 1142      PT SHORT TERM GOAL #1   Title  Pt ind with HEP    Status  Achieved        PT Long Term Goals - 01/23/19 1142      PT LONG TERM GOAL #1   Title  Decreased pain in the left hip to 2/10 with ADLS    Status  On-going      PT LONG TERM GOAL #2   Title  Patient to report no LLE numbness    Status  On-going      PT LONG TERM GOAL #3   Title  Patient to report decreased incidence of leg giving way by 75% or more.    Status  Partially Met      PT LONG TERM GOAL #4   Title  Patient to report increased sitting tolerance without cushion to 30 minutes.    Status  Partially Met             Plan - 01/23/19 1149    Clinical Impression Statement  Pt enter clinic reporting increase L hip pain that stared Saturday night. This pain decreased after aerobic warm up and a few exercises. Tolerated a progressed therapy session well. Tightness noted in the LE's. Good response to traction.    Comorbidities  arthritis, 2 hernias, MI, HTN, DDD, anxiety, right knee surgery    Examination-Activity Limitations  Stairs;Sit;Caring for Others    Rehab Potential  Good    PT Frequency  2x / week    PT Duration  8 weeks    PT Next Visit Plan  lumbar and hip flexibility; STW/MFR/DN to right gluteals; increase traction pull as tolerated (pt weight 220#)       Patient will benefit from skilled therapeutic intervention in order to improve the following deficits and impairments:  Abnormal gait, Pain, Postural dysfunction, Decreased range of motion, Decreased strength, Impaired flexibility  Visit Diagnosis: Radiculopathy, lumbar region     Problem List Patient Active Problem List   Diagnosis Date Noted  . Coccyx pain 11/13/2018  . Hip bursitis, left 09/10/2018  . Sciatica 09/10/2018  . Peroneal neuropathy at knee, left 07/09/2018  . CKD (chronic kidney disease) stage 3, GFR 30-59 ml/min (HCC) 05/14/2018  . Macular degeneration 04/26/2018  . Bilateral buttock pain 11/29/2017  . Cough 09/15/2017  . Bursitis of hip, right 07/18/2017  . Ischial bursitis 05/17/2017  . Asthmatic bronchitis 05/17/2017  . Elbow pain, left 11/21/2016  . Irregular heart beat 09/27/2016  . Hiccups 07/14/2016  . Edema 06/06/2016  . Insomnia 06/06/2016  . Thigh pain 03/15/2016  . Tinnitus 09/15/2015  . Cramps of lower extremity 09/15/2015  . UTI (urinary tract infection) 06/15/2015  . Umbilical hernia 40/98/1191  . Lower GI  bleed 11/26/2014  . Rectal bleeding 11/25/2014  . History of colonic polyps 09/12/2014  . Left foot pain 08/11/2014  . Right foot pain 06/10/2014  . Oral candidiasis 05/15/2014   . Rash 04/30/2014  . Low back pain 01/29/2014  . Anxiety disorder 11/27/2013  . Arthralgia 09/30/2013  . Preop exam for internal medicine 06/19/2013  . Preoperative clearance 06/19/2013  . Urinary frequency 12/12/2012  . Actinic keratoses 12/12/2012  . Thrombocytopenia ([place]) 11/19/2012  . Well adult exam 09/10/2012  . Shoulder pain, left 09/10/2012  . Warts 09/10/2012  . Headache(784.0) 11/29/2011  . Hyperkalemia 08/01/2011  . Neuropathy of hand 08/01/2011  . Numbness 04/28/2011  . Fatigue 04/28/2011  . Cellulitis of trunk 04/21/2011  . Chalazion 07/07/2010  . Disorder resulting from impaired renal function 03/10/2010  . Neoplasm of uncertain behavior of skin 11/09/2009  . PSA, INCREASED 11/09/2009  . HYPERCHOLESTEROLEMIA 08/31/2009  . Hypogonadism in male 07/03/2008  . BRADYCARDIA 07/03/2008  . PERSONAL HX COLONIC POLYPS 07/02/2008  . OSTEOPOROSIS 06/03/2008  . CHOLELITHIASIS 04/29/2008  . CALCU GALLBLADD W/O MENTION CHOLECYST/OBST 04/29/2008  . Celiac disease 04/29/2008  . WEIGHT LOSS 04/08/2008  . GERD (gastroesophageal reflux disease) 04/02/2008  . TUBULOVILLOUS ADENOMA, COLON 04/01/2008  . Moderate persistent asthma without complication 25/83/4621  . Diarrhea 01/31/2008  . Hypothyroidism 08/11/2007  . Gout 08/11/2007  . Essential hypertension 08/11/2007  . MYOCARDIAL INFARCTION, HX OF 08/11/2007  . CAD (coronary artery disease) 08/11/2007    Scot Jun, PTA 01/23/2019, 11:51 AM  [address] [place], [place], [address] Phone: [phone]   Fax:  [phone]  Name: THEOPHILUS WALZ MRN: [id number] Date of Birth: 12/10/1940

## 2019-01-30 ENCOUNTER — Other Ambulatory Visit: Payer: Self-pay | Admitting: Internal Medicine

## 2019-02-13 ENCOUNTER — Ambulatory Visit: Payer: PPO | Admitting: Internal Medicine

## 2019-03-01 DIAGNOSIS — H353121 Nonexudative age-related macular degeneration, left eye, early dry stage: Secondary | ICD-10-CM | POA: Diagnosis not present

## 2019-03-01 DIAGNOSIS — H353211 Exudative age-related macular degeneration, right eye, with active choroidal neovascularization: Secondary | ICD-10-CM | POA: Diagnosis not present

## 2019-03-01 DIAGNOSIS — Z961 Presence of intraocular lens: Secondary | ICD-10-CM | POA: Diagnosis not present

## 2019-03-12 ENCOUNTER — Ambulatory Visit: Payer: PPO

## 2019-03-22 DIAGNOSIS — M1711 Unilateral primary osteoarthritis, right knee: Secondary | ICD-10-CM | POA: Diagnosis not present

## 2019-04-01 DIAGNOSIS — R350 Frequency of micturition: Secondary | ICD-10-CM | POA: Diagnosis not present

## 2019-04-01 DIAGNOSIS — N401 Enlarged prostate with lower urinary tract symptoms: Secondary | ICD-10-CM | POA: Diagnosis not present

## 2019-04-01 DIAGNOSIS — N5201 Erectile dysfunction due to arterial insufficiency: Secondary | ICD-10-CM | POA: Diagnosis not present

## 2019-04-02 DIAGNOSIS — M5136 Other intervertebral disc degeneration, lumbar region: Secondary | ICD-10-CM | POA: Diagnosis not present

## 2019-04-09 ENCOUNTER — Other Ambulatory Visit: Payer: Self-pay | Admitting: Internal Medicine

## 2019-04-12 ENCOUNTER — Ambulatory Visit: Payer: PPO

## 2019-04-17 ENCOUNTER — Ambulatory Visit (INDEPENDENT_AMBULATORY_CARE_PROVIDER_SITE_OTHER): Payer: PPO | Admitting: *Deleted

## 2019-04-17 DIAGNOSIS — Z0000 Encounter for general adult medical examination without abnormal findings: Secondary | ICD-10-CM | POA: Diagnosis not present

## 2019-04-17 NOTE — Progress Notes (Addendum)
Subjective:   Scott Oneal is a 78 y.o. male who presents for Medicare Annual/Subsequent preventive examination. I connected with patient by a telephone and verified that I am speaking with the correct person using two identifiers. Patient stated full name and DOB. Patient gave permission to continue with telephonic visit. Patient's location was at home and Nurse's location was at [place] office.   Review of Systems:   Cardiac Risk Factors include: dyslipidemia;male gender;hypertension Sleep patterns: has frequent nighttime awakenings, gets up 1-2 times nightly to void and sleeps 6-8 hours nightly.    Home Safety/Smoke Alarms: Feels safe in home. Smoke alarms in place.  Living environment; residence and Firearm Safety: split level / walkout. Lives with wife, no needs for DME, good support system Seat Belt Safety/Bike Helmet: Wears seat belt.   PSA-  Lab Results  Component Value Date   PSA 2.91 05/18/2017   PSA 3.58 08/22/2016   PSA 2.51 02/12/2015       Objective:    Vitals: There were no vitals taken for this visit.  There is no height or weight on file to calculate BMI.  Advanced Directives 04/17/2019 01/08/2019 03/06/2018 06/06/2017 02/21/2017 07/10/2016 11/26/2014  Does Patient Have a Medical Advance Directive? Yes Yes Yes Yes Yes Yes Yes  Type of Paramedic of [place];Living will [place];Living will Living will;Healthcare Power of [place];Living will [place];Living will Living will -  Does patient want to make changes to medical advance directive? - No - Patient declined - - - No - Patient declined -  Copy of [place] in Chart? No - copy requested No - copy requested No - copy requested - No - copy requested No - copy requested -    Tobacco Social History   Tobacco Use  Smoking Status Never Smoker  Smokeless Tobacco Never Used     Counseling given: Not Answered   Past Medical History:  Diagnosis Date  . Allergy   . Anxiety   . Arthritis   . Asthma   . Benign neoplasm of colon   . Calculus of gallbladder without mention of cholecystitis   . Cataract    bilateral cateracts removed  . Celiac disease   . Cholelithiasis   . Coronary atherosclerosis of unspecified type of vessel, native or graft   . Diarrhea   . Esophageal reflux   . Glaucoma   . Gout, unspecified   . Hyperparathyroidism   . Long term (current) use of anticoagulants   . Loss of weight   . Old myocardial infarction   . Osteoporosis, unspecified   . Other malaise and fatigue   . Other specified cardiac dysrhythmias(427.89)   . Other testicular hypofunction   . Personal history of colonic polyps   . Pure hypercholesterolemia   . Renal insufficiency    chronic  . Unspecified asthma(493.90)   . Unspecified essential hypertension   . Unspecified hypothyroidism    Past Surgical History:  Procedure Laterality Date  . cataract surgery Bilateral   . COLONOSCOPY    . COLONOSCOPY WITH PROPOFOL N/A 11/27/2014   Procedure: COLONOSCOPY WITH PROPOFOL;  Surgeon: Milus Banister, MD;  Location: [facility];  Service: Endoscopy;  Laterality: N/A;  . CORONARY ANGIOPLASTY    . CORONARY STENT PLACEMENT  2011   Cypher; in distal circumflex artery  . CYSTOSCOPY  1998  . EYE SURGERY    . KNEE SURGERY  right  . SHOULDER SURGERY Left 9/14   Dr Shara Blazing  . UPPER GASTROINTESTINAL ENDOSCOPY    . VASCULAR SURGERY     Family History  Problem Relation Age of Onset  . Lymphoma Father   . Cancer Father        lymphoma  . Hypertension Other   . Vision loss Maternal Grandmother   . Colon cancer Son   . Asthma Neg Hx   . Esophageal cancer Neg Hx   . Rectal cancer Neg Hx   . Stomach cancer Neg Hx    Social History   Socioeconomic History  . Marital status: Married    Spouse name: Layn Kye  . Number of children: 3  . Years of education: Not on file  . Highest education level:  Not on file  Occupational History  . Occupation: retired    Fish farm manager: RETIRED    CommentYouth worker  . Financial resource strain: Not hard at all  . Food insecurity    Worry: Never true    Inability: Never true  . Transportation needs    Medical: No    Non-medical: No  Tobacco Use  . Smoking status: Never Smoker  . Smokeless tobacco: Never Used  Substance and Sexual Activity  . Alcohol use: No  . Drug use: No  . Sexual activity: Yes  Lifestyle  . Physical activity    Days per week: 3 days    Minutes per session: 60 min  . Stress: Only a little  Relationships  . Social connections    Talks on phone: More than three times a week    Gets together: More than three times a week    Attends religious service: 1 to 4 times per year    Active member of club or organization: Yes    Attends meetings of clubs or organizations: 1 to 4 times per year    Relationship status: Married  Other Topics Concern  . Not on file  Social History Narrative   Retired.   Regular Exercise-Yes; yoga & silver sneakers   Daily Caffeine Use.             Outpatient Encounter Medications as of 04/17/2019  Medication Sig  . aspirin 81 MG tablet Take 81 mg by mouth every evening.   . B-D 3CC LUER-LOK SYR 21GX1" 21G X 1" 3 ML MISC USE EVERY 14 DAYS AS DIRECTED  . calcitRIOL (ROCALTROL) 0.25 MCG capsule TAKE 1 CAPSULE BY MOUTH DAILY. ANNUAL APPT DUE IN OCT MUST SEE MD FOR REFILLS  . carvedilol (COREG) 25 MG tablet TAKE 1 TABLET BY MOUTH TWICE A DAY WITH MEALS  . Cholecalciferol 2000 UNITS TABS Take 2,000 Units by mouth daily.   . clonazePAM (KLONOPIN) 0.25 MG disintegrating tablet Take 1 tablet (0.25 mg total) by mouth 2 (two) times daily as needed (anxiety).  Marland Kitchen diclofenac sodium (VOLTAREN) 1 % GEL APPLY 4 G TOPICALLY 4 TIMES DAILY.  Marland Kitchen levothyroxine (SYNTHROID, LEVOTHROID) 150 MCG tablet TAKE 1 TABLET BY MOUTH EVERY DAY  . Multiple Vitamin (MULTIVITAMIN) capsule Take 1 capsule by mouth  daily.  . nitroGLYCERIN (NITROSTAT) 0.4 MG SL tablet Place 0.4 mg under the tongue every 5 (five) minutes as needed for chest pain.  Marland Kitchen olmesartan (BENICAR) 20 MG tablet TAKE 1 TABLET (20 MG TOTAL) BY MOUTH DAILY.  . sildenafil (VIAGRA) 100 MG tablet Take 100 mg by mouth daily as needed for erectile dysfunction.   . simvastatin (ZOCOR) 10 MG tablet  Take 1 tablet (10 mg total) by mouth at bedtime.  Marland Kitchen testosterone cypionate (DEPOTESTOSTERONE CYPIONATE) 200 MG/ML injection INJECT 2ML INTO MUSCLE EVERY 2 WEEKS  . [DISCONTINUED] nortriptyline (PAMELOR) 25 MG capsule Take 1-2 capsules (25-50 mg total) by mouth at bedtime. (Patient not taking: Reported on 04/17/2019)   No facility-administered encounter medications on file as of 04/17/2019.     Activities of Daily Living In your present state of health, do you have any difficulty performing the following activities: 04/17/2019  Hearing? N  Vision? N  Difficulty concentrating or making decisions? N  Walking or climbing stairs? N  Dressing or bathing? N  Doing errands, shopping? N  Preparing Food and eating ? N  Using the Toilet? N  In the past six months, have you accidently leaked urine? N  Do you have problems with loss of bowel control? N  Managing your Medications? N  Managing your Finances? N  Housekeeping or managing your Housekeeping? N  Some recent data might be hidden    Patient Care Team: Plotnikov, Evie Lacks, MD as PCP - Cyndia Diver, MD as PCP - Cardiology (Cardiology) Elsie Stain, MD as Consulting Physician (Pulmonary Disease) Ladene Artist, MD as Consulting Physician (Gastroenterology) Franchot Gallo, MD as Consulting Physician (Urology) Corliss Parish, MD as Consulting Physician (Nephrology) Sherren Mocha, MD as Consulting Physician (Cardiology) Loleta Books, MD as Consulting Physician (Ophthalmology)   Assessment:   This is a routine wellness examination for [place]. Physical assessment  deferred to PCP.   Exercise Activities and Dietary recommendations Current Exercise Habits: Home exercise routine, Type of exercise: walking;strength training/weights, Time (Minutes): 30, Frequency (Times/Week): 3, Weekly Exercise (Minutes/Week): 90, Intensity: Mild, Exercise limited by: orthopedic condition(s)  Diet (meal preparation, eat out, water intake, caffeinated beverages, dairy products, fruits and vegetables): in general, a "healthy" diet  , well balanced   Reviewed heart healthy diet. Encouraged patient to increase daily water and healthy fluid intake.   Goals    . Maintain current health status     Continue to eat healthy and exercise weekly.    . Patient Stated     Stay as healthy and as independent as possible. Enjoy life, family, continue to go on our annual beach trip with my family.       Fall Risk Fall Risk  04/17/2019 03/06/2018 10/23/2017 10/23/2017 02/21/2017  Falls in the past year? 0 Yes No No No  Number falls in past yr: 0 1 - - -  Risk for fall due to : Impaired balance/gait - - - -  Follow up Falls prevention discussed Falls prevention discussed - - -   Depression Screen PHQ 2/9 Scores 04/17/2019 03/06/2018 10/23/2017 10/23/2017  PHQ - 2 Score 0 1 0 0  PHQ- 9 Score - 1 - -    Cognitive Function MMSE - Mini Mental State Exam 03/06/2018  Orientation to time 5  Orientation to Place 5  Registration 3  Attention/ Calculation 5  Recall 2  Language- name 2 objects 2  Language- repeat 1  Language- follow 3 step command 3  Language- read & follow direction 1  Write a sentence 1  Copy design 1  Total score 29       Ad8 score reviewed for issues:  Issues making decisions: no  Less interest in hobbies / activities: no  Repeats questions, stories (family complaining): no  Trouble using ordinary gadgets (microwave, computer, phone):no  Forgets the month or year: no  Mismanaging finances: no  Remembering appts: no  Daily problems with thinking  and/or memory: no Ad8 score is= 0  Immunization History  Administered Date(s) Administered  . Influenza Split 07/27/2011, 08/07/2012  . Influenza Whole 10/04/2002, 07/30/2008, 08/12/2009, 07/07/2010  . Influenza, High Dose Seasonal PF 08/07/2015, 08/01/2016, 07/20/2017, 06/29/2018  . Influenza,inj,Quad PF,6+ Mos 07/17/2013, 07/21/2014  . Influenza-Unspecified 06/29/2018  . Pneumococcal Conjugate-13 09/30/2013  . Pneumococcal Polysaccharide-23 05/15/2006, 12/12/2012  . Td 11/24/2010  . Zoster 07/11/2006  . Zoster Recombinat (Shingrix) 03/02/2017, 06/08/2017   Screening Tests Health Maintenance  Topic Date Due  . INFLUENZA VACCINE  06/01/2019  . COLONOSCOPY  06/26/2020  . TETANUS/TDAP  11/24/2020  . PNA vac Low Risk Adult  Completed        Plan:    Reviewed health maintenance screenings with patient today and relevant education, vaccines, and/or referrals were provided.   Continue to eat heart healthy diet (full of fruits, vegetables, whole grains, lean protein, water--limit salt, fat, and sugar intake) and increase physical activity as tolerated.  Continue doing brain stimulating activities (puzzles, reading, adult coloring books, staying active) to keep memory sharp.   I have personally reviewed and noted the following in the patient's chart:   . Medical and social history . Use of alcohol, tobacco or illicit drugs  . Current medications and supplements . Functional ability and status . Nutritional status . Physical activity . Advanced directives . List of other physicians . Screenings to include cognitive, depression, and falls . Referrals and appointments  In addition, I have reviewed and discussed with patient certain preventive protocols, quality metrics, and best practice recommendations. A written personalized care plan for preventive services as well as general preventive health recommendations were provided to patient.     Michiel Cowboy, RN  04/17/2019   Medical screening examination/treatment/procedure(s) were performed by non-physician practitioner and as supervising physician I was immediately available for consultation/collaboration. I agree with above. Lew Dawes, MD

## 2019-04-24 ENCOUNTER — Ambulatory Visit (INDEPENDENT_AMBULATORY_CARE_PROVIDER_SITE_OTHER)
Admission: RE | Admit: 2019-04-24 | Discharge: 2019-04-24 | Disposition: A | Discharge status: Home / Self Care | Payer: PPO | Source: Ambulatory Visit | Attending: Internal Medicine | Admitting: Internal Medicine

## 2019-04-24 ENCOUNTER — Ambulatory Visit (INDEPENDENT_AMBULATORY_CARE_PROVIDER_SITE_OTHER): Payer: PPO | Admitting: Internal Medicine

## 2019-04-24 ENCOUNTER — Encounter: Payer: Self-pay | Admitting: Internal Medicine

## 2019-04-24 ENCOUNTER — Other Ambulatory Visit (INDEPENDENT_AMBULATORY_CARE_PROVIDER_SITE_OTHER): Payer: PPO

## 2019-04-24 ENCOUNTER — Other Ambulatory Visit: Payer: Self-pay

## 2019-04-24 VITALS — BP 144/78 | HR 68 | Temp 98.3°F | Ht 72.0 in | Wt 221.0 lb

## 2019-04-24 DIAGNOSIS — N183 Chronic kidney disease, stage 3 unspecified: Secondary | ICD-10-CM

## 2019-04-24 DIAGNOSIS — N320 Bladder-neck obstruction: Secondary | ICD-10-CM | POA: Diagnosis not present

## 2019-04-24 DIAGNOSIS — M79641 Pain in right hand: Secondary | ICD-10-CM | POA: Insufficient documentation

## 2019-04-24 DIAGNOSIS — M545 Low back pain, unspecified: Secondary | ICD-10-CM

## 2019-04-24 DIAGNOSIS — G8929 Other chronic pain: Secondary | ICD-10-CM | POA: Diagnosis not present

## 2019-04-24 DIAGNOSIS — I10 Essential (primary) hypertension: Secondary | ICD-10-CM

## 2019-04-24 DIAGNOSIS — M79644 Pain in right finger(s): Secondary | ICD-10-CM | POA: Diagnosis not present

## 2019-04-24 LAB — PSA: PSA: 2.64 ng/mL (ref 0.10–4.00)

## 2019-04-24 LAB — BASIC METABOLIC PANEL
BUN: 15 mg/dL (ref 6–23)
CO2: 31 mEq/L (ref 19–32)
Calcium: 9.2 mg/dL (ref 8.4–10.5)
Chloride: 104 mEq/L (ref 96–112)
Creatinine, Ser: 1.78 mg/dL — ABNORMAL HIGH (ref 0.40–1.50)
GFR: 37.19 mL/min — ABNORMAL LOW (ref >60.00)
Glucose, Bld: 99 mg/dL (ref 70–99)
Potassium: 4.8 mEq/L (ref 3.5–5.1)
Sodium: 141 mEq/L (ref 135–145)

## 2019-04-24 NOTE — Assessment & Plan Note (Signed)
Amlodipine 2.5 mg/d, Coreg, Benicar

## 2019-04-24 NOTE — Assessment & Plan Note (Signed)
Labs

## 2019-04-24 NOTE — Assessment & Plan Note (Signed)
L sciatica - f/u w/Dr Rolena Infante, Dr Nelva Bush

## 2019-04-24 NOTE — Progress Notes (Signed)
Subjective:  Patient ID: Scott Oneal, male    DOB: [birth year]  Age: 78 y.o. MRN: [id number]  CC: No chief complaint on file.   HPI RODERICK CALO presents for R 3d finger injury - it popped 2 wks ago C/o L sciatica  Outpatient Medications Prior to Visit  Medication Sig Dispense Refill  . aspirin 81 MG tablet Take 81 mg by mouth every evening.     . B-D 3CC LUER-LOK SYR 21GX1" 21G X 1" 3 ML MISC USE EVERY 14 DAYS AS DIRECTED 50 each 2  . calcitRIOL (ROCALTROL) 0.25 MCG capsule TAKE 1 CAPSULE BY MOUTH DAILY. ANNUAL APPT DUE IN OCT MUST SEE MD FOR REFILLS 90 capsule 1  . carvedilol (COREG) 25 MG tablet TAKE 1 TABLET BY MOUTH TWICE A DAY WITH MEALS 180 tablet 3  . Cholecalciferol 2000 UNITS TABS Take 2,000 Units by mouth daily.     . clonazePAM (KLONOPIN) 0.25 MG disintegrating tablet Take 1 tablet (0.25 mg total) by mouth 2 (two) times daily as needed (anxiety). 60 tablet 1  . diclofenac sodium (VOLTAREN) 1 % GEL APPLY 4 G TOPICALLY 4 TIMES DAILY. 200 g 5  . levothyroxine (SYNTHROID, LEVOTHROID) 150 MCG tablet TAKE 1 TABLET BY MOUTH EVERY DAY 90 tablet 3  . Multiple Vitamin (MULTIVITAMIN) capsule Take 1 capsule by mouth daily.    . nitroGLYCERIN (NITROSTAT) 0.4 MG SL tablet Place 0.4 mg under the tongue every 5 (five) minutes as needed for chest pain.    Marland Kitchen olmesartan (BENICAR) 20 MG tablet TAKE 1 TABLET (20 MG TOTAL) BY MOUTH DAILY. 90 tablet 3  . sildenafil (VIAGRA) 100 MG tablet Take 100 mg by mouth daily as needed for erectile dysfunction.     . simvastatin (ZOCOR) 10 MG tablet Take 1 tablet (10 mg total) by mouth at bedtime. 90 tablet 3  . testosterone cypionate (DEPOTESTOSTERONE CYPIONATE) 200 MG/ML injection INJECT 2ML INTO MUSCLE EVERY 2 WEEKS 12 mL 1   No facility-administered medications prior to visit.     ROS: Review of Systems  Constitutional: Positive for fatigue. Negative for appetite change and unexpected weight change.  HENT: Negative for congestion, nosebleeds,  sneezing, sore throat and trouble swallowing.   Eyes: Negative for itching and visual disturbance.  Respiratory: Negative for cough.   Cardiovascular: Negative for chest pain, palpitations and leg swelling.  Gastrointestinal: Negative for abdominal distention, blood in stool, diarrhea and nausea.  Genitourinary: Negative for frequency and hematuria.  Musculoskeletal: Positive for arthralgias, back pain and gait problem. Negative for joint swelling and neck pain.  Skin: Negative for rash.  Neurological: Negative for dizziness, tremors, speech difficulty and weakness.  Psychiatric/Behavioral: Negative for agitation, dysphoric mood, sleep disturbance and suicidal ideas. The patient is not nervous/anxious.     Objective:  BP (!) 144/78 (BP Location: Left Arm, Patient Position: Sitting, Cuff Size: Normal)   Pulse 68   Temp 98.3 F (36.8 C) (Oral)   Ht 6' (1.829 m)   Wt 221 lb (100.2 kg)   SpO2 97%   BMI 29.97 kg/m   BP Readings from Last 3 Encounters:  04/24/19 (!) 144/78  11/13/18 128/74  09/10/18 132/80    Wt Readings from Last 3 Encounters:  04/24/19 221 lb (100.2 kg)  11/13/18 222 lb (100.7 kg)  09/10/18 223 lb (101.2 kg)    Physical Exam Constitutional:      General: He is not in acute distress.    Appearance: He is well-developed.  Comments: NAD  Eyes:     Conjunctiva/sclera: Conjunctivae normal.     Pupils: Pupils are equal, round, and reactive to light.  Neck:     Musculoskeletal: Normal range of motion.     Thyroid: No thyromegaly.     Vascular: No JVD.  Cardiovascular:     Rate and Rhythm: Normal rate and regular rhythm.     Heart sounds: Normal heart sounds. No murmur. No friction rub. No gallop.   Pulmonary:     Effort: Pulmonary effort is normal. No respiratory distress.     Breath sounds: Normal breath sounds. No wheezing or rales.  Chest:     Chest wall: No tenderness.  Abdominal:     General: Bowel sounds are normal. There is no distension.      Palpations: Abdomen is soft. There is no mass.     Tenderness: There is no abdominal tenderness. There is no guarding or rebound.  Musculoskeletal: Normal range of motion.        General: No tenderness.  Lymphadenopathy:     Cervical: No cervical adenopathy.  Skin:    General: Skin is warm and dry.     Findings: No rash.  Neurological:     Mental Status: He is alert and oriented to person, place, and time.     Cranial Nerves: No cranial nerve deficit.     Motor: No abnormal muscle tone.     Coordination: Coordination normal.     Gait: Gait normal.     Deep Tendon Reflexes: Reflexes are normal and symmetric.  Psychiatric:        Behavior: Behavior normal.        Thought Content: Thought content normal.        Judgment: Judgment normal.   LS stiff R 3d finger knuckle is swollen, tender  Lab Results  Component Value Date   WBC 9.0 11/01/2017   HGB 16.8 11/01/2017   HCT 50.9 11/01/2017   PLT 123.0 (L) 11/01/2017   GLUCOSE 101 (H) 11/29/2017   CHOL 118 05/18/2017   TRIG 82.0 05/18/2017   HDL 40.60 05/18/2017   LDLCALC 61 05/18/2017   ALT 17 05/18/2017   AST 21 05/18/2017   NA 141 11/29/2017   K 4.7 11/29/2017   CL 104 11/29/2017   CREATININE 2.02 (H) 11/29/2017   BUN 17 11/29/2017   CO2 32 11/29/2017   TSH 4.28 11/29/2017   PSA 2.91 05/18/2017   INR 1.20 11/26/2014   HGBA1C 5.9 03/15/2016   MICROALBUR 1.2 05/18/2017    Dg Pelvis 1-2 Views  Result Date: 11/29/2017 CLINICAL DATA:  Recent slip and fall with bilateral hip pain, initial encounter EXAM: PELVIS - 1-2 VIEW COMPARISON:  None. FINDINGS: Pelvic ring is intact. No acute fracture or dislocation is noted. Mild degenerative changes of the hip joints are seen. IMPRESSION: No acute fracture identified. Electronically Signed   By: Inez Catalina M.D.   On: 11/29/2017 19:58    Assessment & Plan:   There are no diagnoses linked to this encounter.   No orders of the defined types were placed in this encounter.     Follow-up: No follow-ups on file.  Walker Kehr, MD

## 2019-04-24 NOTE — Patient Instructions (Signed)
Voltaren gel

## 2019-04-24 NOTE — Assessment & Plan Note (Signed)
?  dislocation 3d knuckle

## 2019-04-26 ENCOUNTER — Other Ambulatory Visit: Payer: Self-pay | Admitting: Internal Medicine

## 2019-04-26 DIAGNOSIS — G8929 Other chronic pain: Secondary | ICD-10-CM

## 2019-04-26 DIAGNOSIS — H353211 Exudative age-related macular degeneration, right eye, with active choroidal neovascularization: Secondary | ICD-10-CM | POA: Diagnosis not present

## 2019-04-26 DIAGNOSIS — M79641 Pain in right hand: Secondary | ICD-10-CM

## 2019-05-09 ENCOUNTER — Encounter: Payer: Self-pay | Admitting: Physical Therapy

## 2019-05-09 ENCOUNTER — Other Ambulatory Visit: Payer: Self-pay

## 2019-05-09 ENCOUNTER — Ambulatory Visit: Payer: PPO | Attending: Internal Medicine | Admitting: Physical Therapy

## 2019-05-09 DIAGNOSIS — M62830 Muscle spasm of back: Secondary | ICD-10-CM | POA: Diagnosis not present

## 2019-05-09 DIAGNOSIS — M5416 Radiculopathy, lumbar region: Secondary | ICD-10-CM

## 2019-05-09 DIAGNOSIS — M25552 Pain in left hip: Secondary | ICD-10-CM

## 2019-05-09 NOTE — Therapy (Signed)
[facility] [place], [place], [address] Phone: [phone]   Fax:  [phone]  Physical Therapy Evaluation  Patient Details  Name: CLAUDIO MONDRY MRN: [id number] Date of Birth: 1941-08-07 Referring Provider (PT): Plotnikov   Encounter Date: 05/09/2019  PT End of Session - 05/09/19 6503    Visit Number  1    Date for PT Re-Evaluation  07/10/19    PT Start Time  5465    PT Stop Time  0850    PT Time Calculation (min)  55 min    Activity Tolerance  Patient tolerated treatment well    Behavior During Therapy  [facility] for tasks assessed/performed       Past Medical History:  Diagnosis Date  . Allergy   . Anxiety   . Arthritis   . Asthma   . Benign neoplasm of colon   . Calculus of gallbladder without mention of cholecystitis   . Cataract    bilateral cateracts removed  . Celiac disease   . Cholelithiasis   . Coronary atherosclerosis of unspecified type of vessel, native or graft   . Diarrhea   . Esophageal reflux   . Glaucoma   . Gout, unspecified   . Hyperparathyroidism   . Long term (current) use of anticoagulants   . Loss of weight   . Old myocardial infarction   . Osteoporosis, unspecified   . Other malaise and fatigue   . Other specified cardiac dysrhythmias(427.89)   . Other testicular hypofunction   . Personal history of colonic polyps   . Pure hypercholesterolemia   . Renal insufficiency    chronic  . Unspecified asthma(493.90)   . Unspecified essential hypertension   . Unspecified hypothyroidism     Past Surgical History:  Procedure Laterality Date  . cataract surgery Bilateral   . COLONOSCOPY    . COLONOSCOPY WITH PROPOFOL N/A 11/27/2014   Procedure: COLONOSCOPY WITH PROPOFOL;  Surgeon: Milus Banister, MD;  Location: [place];  Service: Endoscopy;  Laterality: N/A;  . CORONARY ANGIOPLASTY    . CORONARY STENT PLACEMENT  2011   Cypher; in distal circumflex artery  .  CYSTOSCOPY  1998  . EYE SURGERY    . KNEE SURGERY     right  . SHOULDER SURGERY Left 9/14   Dr Shara Blazing  . UPPER GASTROINTESTINAL ENDOSCOPY    . VASCULAR SURGERY      There were no vitals filed for this visit.   Subjective Assessment - 05/09/19 0800    Subjective  Patient was being seen here earlier in the year, but stopped due to the corona virus.  He reports that recently he has been having difficulty getting up from sitting, he reports that he has had to change how he walks due to more weakness and pain in the left leg.  Past x-rays showed DDD and herniated discs    Pertinent History  arthritis, 2 hernias, MI, HTN, DDD, anxiety, right knee surgery    Limitations  Walking;House hold activities;Sitting    Diagnostic tests  MRI - spinal stenosis and DDD    Patient Stated Goals  to get rid of pain    Currently in Pain?  Yes    Pain Score  3     Pain Location  Back    Pain Orientation  Lower;Left    Pain Descriptors / Indicators  Sharp;Dull;Aching;Tingling    Pain Type  Acute pain    Pain Radiating Towards  left hip, left thigh pain and into numbness, can go down to the left foot    Pain Onset  More than a month ago    Pain Frequency  Constant    Aggravating Factors   the more active I am the more pain I have pain up to 9/10    Pain Relieving Factors  nothing really helps just reports time helps    Effect of Pain on Daily Activities  reports difficulty with ADL's, yard work and sleeping         [facility] PT Assessment - 05/09/19 0001      Assessment   Medical Diagnosis  LBP, left leg pain, right hand pain    Referring Provider (PT)  Plotnikov    Onset Date/Surgical Date  04/09/19    Prior Therapy  we saw him 3-4 visits prior to the covid      Precautions   Precautions  None      Restrictions   Weight Bearing Restrictions  No      Balance Screen   Has the patient fallen in the past 6 months  No    Has the patient had a decrease in activity level because of a fear of falling?    No    Is the patient reluctant to leave their home because of a fear of falling?   No      Home Environment   Additional Comments  split level with railings, he does all the yardwork and the housework      Prior Function   Level of Independence  Independent    Vocation  Retired    Leisure  prior to [employer] was going to the [employer] about 3 x/week      Posture/Postural Control   Posture Comments  rounded shoulders, fwd head, decresaed lordosis      AROM   Overall AROM Comments  Lumbar ROM decreased 50% for all motions except extension was decreased 100% all caused some left low back pain      Strength   Overall Strength Comments  right LE 4+/5, left LE 4-/5, reports that he feels weak on the left side, some increase of pain      Flexibility   Soft Tissue Assessment /Muscle Length  yes    Hamstrings  marked bil tightness    Quadriceps  tight    ITB  +ober bil Left>Right    Piriformis  mod tightness bil    Quadratus Lumborum  left tight      Palpation   Palpation comment  marked tenderness in left gluteals, piriformis and QL, has a tender area in the left GT                Objective measurements completed on examination: See above findings.      [place] [place] Adult PT Treatment/Exercise - 05/09/19 0001      Traction   Type of Traction  Lumbar    Min (lbs)  65    Max (lbs)  80    Hold Time  60    Rest Time  20    Time  15               PT Short Term Goals - 05/09/19 0826      PT SHORT TERM GOAL #1   Title  Pt ind with HEP    Time  2    Period  Weeks    Status  New  PT Long Term Goals - 05/09/19 0867      PT LONG TERM GOAL #1   Title  Decreased pain in the left hip to 2/10 with ADLS    Time  8    Period  Weeks    Status  New      PT LONG TERM GOAL #2   Title  Patient to report no LLE numbness    Time  8    Period  Weeks    Status  New      PT LONG TERM GOAL #3   Title  Patient to report decreased incidence of leg giving way by 75% or more.     Time  8    Period  Weeks    Status  New      PT LONG TERM GOAL #4   Title  Patient to report increased sitting tolerance without cushion to 30 minutes.    Time  8    Period  Weeks    Status  Partially Met      PT LONG TERM GOAL #5   Title  increase left LE strength to 4+/5    Time  8    Period  Weeks    Status  New             Plan - 05/09/19 6195    Clinical Impression Statement  Patient was just starting to be seen here in March prior to Covid, he stopped coming, he reports that since that time he has continued to have low back pain and left leg pain, reports increased difficuty getting up from sitting and some difficulty walking due to left leg pain and weakness in the left leg.  He is very tight in the LE's.  he is tender in the left lumbar and buttock area and in the left GT area.    Personal Factors and Comorbidities  Age;Comorbidity 3+    Comorbidities  arthritis, 2 hernias, MI, HTN, DDD, anxiety, right knee surgery    Examination-Activity Limitations  Stairs;Sit;Caring for Others    Stability/Clinical Decision Making  Stable/Uncomplicated    Clinical Decision Making  Low    Rehab Potential  Good    PT Frequency  2x / week    PT Duration  8 weeks    PT Treatment/Interventions  ADLs/Self Care Home Management;Cryotherapy;Moist Heat;Traction;Electrical Stimulation;Ultrasound;Therapeutic exercise;Therapeutic activities;Neuromuscular re-education;Dry needling;Manual techniques;Taping;Iontophoresis 68m/ml Dexamethasone;Patient/family education    PT Next Visit Plan  lumbar and hip flexibility; STW/MFR/DN to right gluteals; increase traction pull as tolerated (pt weight 220#)    Consulted and Agree with Plan of Care  Patient       Patient will benefit from skilled therapeutic intervention in order to improve the following deficits and impairments:  Abnormal gait, Pain, Postural dysfunction, Decreased range of motion, Decreased strength, Impaired flexibility, Improper body  mechanics, Increased muscle spasms  Visit Diagnosis: 1. Radiculopathy, lumbar region   2. Pain in left hip   3. Muscle spasm of back        Problem List Patient Active Problem List   Diagnosis Date Noted  . Hand pain, right 04/24/2019  . Coccyx pain 11/13/2018  . Hip bursitis, left 09/10/2018  . Sciatic pain, left 09/10/2018  . Peroneal neuropathy at knee, left 07/09/2018  . CKD (chronic kidney disease) stage 3, GFR 30-59 ml/min (HCC) 05/14/2018  . Macular degeneration 04/26/2018  . Bilateral buttock pain 11/29/2017  . Cough 09/15/2017  . Bursitis of hip, right 07/18/2017  . Ischial  bursitis 05/17/2017  . Asthmatic bronchitis 05/17/2017  . Elbow pain, left 11/21/2016  . Irregular heart beat 09/27/2016  . Hiccups 07/14/2016  . Edema 06/06/2016  . Insomnia 06/06/2016  . Thigh pain 03/15/2016  . Tinnitus 09/15/2015  . Cramps of lower extremity 09/15/2015  . UTI (urinary tract infection) 06/15/2015  . Umbilical hernia 86/85/4883  . Lower GI bleed 11/26/2014  . Rectal bleeding 11/25/2014  . History of colonic polyps 09/12/2014  . Left foot pain 08/11/2014  . Right foot pain 06/10/2014  . Oral candidiasis 05/15/2014  . Rash 04/30/2014  . Low back pain 01/29/2014  . Anxiety disorder 11/27/2013  . Arthralgia 09/30/2013  . Preop exam for internal medicine 06/19/2013  . Preoperative clearance 06/19/2013  . Urinary frequency 12/12/2012  . Actinic keratoses 12/12/2012  . Thrombocytopenia ([place]) 11/19/2012  . Well adult exam 09/10/2012  . Shoulder pain, left 09/10/2012  . Warts 09/10/2012  . Headache(784.0) 11/29/2011  . Hyperkalemia 08/01/2011  . Neuropathy of hand 08/01/2011  . Numbness 04/28/2011  . Fatigue 04/28/2011  . Cellulitis of trunk 04/21/2011  . Chalazion 07/07/2010  . Disorder resulting from impaired renal function 03/10/2010  . Neoplasm of uncertain behavior of skin 11/09/2009  . PSA, INCREASED 11/09/2009  . HYPERCHOLESTEROLEMIA 08/31/2009  .  Hypogonadism in male 07/03/2008  . BRADYCARDIA 07/03/2008  . PERSONAL HX COLONIC POLYPS 07/02/2008  . OSTEOPOROSIS 06/03/2008  . CHOLELITHIASIS 04/29/2008  . CALCU GALLBLADD W/O MENTION CHOLECYST/OBST 04/29/2008  . Celiac disease 04/29/2008  . WEIGHT LOSS 04/08/2008  . GERD (gastroesophageal reflux disease) 04/02/2008  . TUBULOVILLOUS ADENOMA, COLON 04/01/2008  . Moderate persistent asthma without complication 01/41/5973  . Diarrhea 01/31/2008  . Hypothyroidism 08/11/2007  . Gout 08/11/2007  . Essential hypertension 08/11/2007  . MYOCARDIAL INFARCTION, HX OF 08/11/2007  . CAD (coronary artery disease) 08/11/2007    Sumner Boast., PT 05/09/2019, 8:28 AM  [facility] [address] [place], [place], [address] Phone: [phone]   Fax:  [phone]  Name: ALDAHIR LITAKER MRN: [id number] Date of Birth: [birth year]

## 2019-05-10 DIAGNOSIS — M7072 Other bursitis of hip, left hip: Secondary | ICD-10-CM | POA: Diagnosis not present

## 2019-05-10 DIAGNOSIS — M545 Low back pain: Secondary | ICD-10-CM | POA: Diagnosis not present

## 2019-05-13 ENCOUNTER — Other Ambulatory Visit: Payer: Self-pay | Admitting: Internal Medicine

## 2019-06-02 ENCOUNTER — Other Ambulatory Visit: Payer: Self-pay | Admitting: Internal Medicine

## 2019-06-17 DIAGNOSIS — H353121 Nonexudative age-related macular degeneration, left eye, early dry stage: Secondary | ICD-10-CM | POA: Diagnosis not present

## 2019-06-17 DIAGNOSIS — H353112 Nonexudative age-related macular degeneration, right eye, intermediate dry stage: Secondary | ICD-10-CM | POA: Diagnosis not present

## 2019-06-27 ENCOUNTER — Other Ambulatory Visit: Payer: Self-pay | Admitting: Internal Medicine

## 2019-07-01 DIAGNOSIS — M7061 Trochanteric bursitis, right hip: Secondary | ICD-10-CM | POA: Diagnosis not present

## 2019-07-18 ENCOUNTER — Other Ambulatory Visit: Payer: Self-pay

## 2019-07-18 ENCOUNTER — Ambulatory Visit (INDEPENDENT_AMBULATORY_CARE_PROVIDER_SITE_OTHER): Payer: PPO

## 2019-07-18 DIAGNOSIS — Z23 Encounter for immunization: Secondary | ICD-10-CM

## 2019-07-25 ENCOUNTER — Other Ambulatory Visit (INDEPENDENT_AMBULATORY_CARE_PROVIDER_SITE_OTHER): Payer: PPO

## 2019-07-25 ENCOUNTER — Other Ambulatory Visit: Payer: Self-pay

## 2019-07-25 ENCOUNTER — Ambulatory Visit (INDEPENDENT_AMBULATORY_CARE_PROVIDER_SITE_OTHER)
Admission: RE | Admit: 2019-07-25 | Discharge: 2019-07-25 | Disposition: A | Discharge status: Home / Self Care | Payer: PPO | Source: Ambulatory Visit | Attending: Internal Medicine | Admitting: Internal Medicine

## 2019-07-25 ENCOUNTER — Ambulatory Visit (INDEPENDENT_AMBULATORY_CARE_PROVIDER_SITE_OTHER): Payer: PPO | Admitting: Internal Medicine

## 2019-07-25 ENCOUNTER — Encounter: Payer: Self-pay | Admitting: Internal Medicine

## 2019-07-25 DIAGNOSIS — F411 Generalized anxiety disorder: Secondary | ICD-10-CM

## 2019-07-25 DIAGNOSIS — R61 Generalized hyperhidrosis: Secondary | ICD-10-CM

## 2019-07-25 DIAGNOSIS — I2510 Atherosclerotic heart disease of native coronary artery without angina pectoris: Secondary | ICD-10-CM

## 2019-07-25 DIAGNOSIS — E034 Atrophy of thyroid (acquired): Secondary | ICD-10-CM

## 2019-07-25 DIAGNOSIS — I10 Essential (primary) hypertension: Secondary | ICD-10-CM

## 2019-07-25 DIAGNOSIS — N183 Chronic kidney disease, stage 3 unspecified: Secondary | ICD-10-CM

## 2019-07-25 LAB — CBC WITH DIFFERENTIAL/PLATELET
Basophils Absolute: 0.1 10*3/uL (ref 0.0–0.1)
Basophils Relative: 1.3 % (ref 0.0–3.0)
Eosinophils Absolute: 0.1 10*3/uL (ref 0.0–0.7)
Eosinophils Relative: 1.9 % (ref 0.0–5.0)
HCT: 48.5 % (ref 39.0–52.0)
Hemoglobin: 15.8 g/dL (ref 13.0–17.0)
Lymphocytes Relative: 20.2 % (ref 12.0–46.0)
Lymphs Abs: 0.9 10*3/uL (ref 0.7–4.0)
MCHC: 32.5 g/dL (ref 30.0–36.0)
MCV: 92.6 fl (ref 78.0–100.0)
Monocytes Absolute: 0.4 10*3/uL (ref 0.1–1.0)
Monocytes Relative: 9.3 % (ref 3.0–12.0)
Neutro Abs: 3.2 10*3/uL (ref 1.4–7.7)
Neutrophils Relative %: 67.3 % (ref 43.0–77.0)
Platelets: 69 10*3/uL — ABNORMAL LOW (ref 150.0–400.0)
RBC: 5.24 Mil/uL (ref 4.22–5.81)
RDW: 16.9 % — ABNORMAL HIGH (ref 11.5–15.5)
WBC: 4.7 10*3/uL (ref 4.0–10.5)

## 2019-07-25 LAB — BASIC METABOLIC PANEL
BUN: 17 mg/dL (ref 6–23)
CO2: 30 mEq/L (ref 19–32)
Calcium: 9.7 mg/dL (ref 8.4–10.5)
Chloride: 103 mEq/L (ref 96–112)
Creatinine, Ser: 1.79 mg/dL — ABNORMAL HIGH (ref 0.40–1.50)
GFR: 36.93 mL/min — ABNORMAL LOW (ref >60.00)
Glucose, Bld: 94 mg/dL (ref 70–99)
Potassium: 4.8 mEq/L (ref 3.5–5.1)
Sodium: 141 mEq/L (ref 135–145)

## 2019-07-25 LAB — HEPATIC FUNCTION PANEL
ALT: 28 U/L (ref 0–53)
AST: 28 U/L (ref 0–37)
Albumin: 3.9 g/dL (ref 3.5–5.2)
Alkaline Phosphatase: 77 U/L (ref 39–117)
Bilirubin, Direct: 0.2 mg/dL (ref 0.0–0.3)
Total Bilirubin: 1 mg/dL (ref 0.2–1.2)
Total Protein: 6.3 g/dL (ref 6.0–8.3)

## 2019-07-25 LAB — TSH: TSH: 3.93 u[IU]/mL (ref 0.35–4.50)

## 2019-07-25 NOTE — Patient Instructions (Addendum)
Hip opener exercises on YOUTUBE   These suggestions will probably help you to improve your metabolism if you are not overweight and to lose weight if you are overweight: 1.  Reduce your consumption of sugars and starches.  Eliminate high fructose corn syrup from your diet.  Reduce your consumption of processed foods.  For desserts try to have seasonal fruits, berries, nuts, cheeses or dark chocolate with more than 70% cacao. 2.  Do not snack 3.  You do not have to eat breakfast.  If you choose to have breakfast-eat plain greek yogurt, eggs, oatmeal (without sugar) 4.  Drink water, freshly brewed unsweetened tea (green, black or herbal) or coffee.  Do not drink sodas including diet sodas , juices, beverages sweetened with artificial sweeteners. 5.  Reduce your consumption of refined grains. 6.  Avoid protein drinks such as Optifast, Slim fast etc. Eat chicken, fish, meat, dairy and beans for your sources of protein 7.  Natural unprocessed fats like cold pressed virgin olive oil, butter, coconut oil are good for you.  Eat avocados 8.  Increase your consumption of fiber.  Fruits, berries, vegetables, whole grains, flaxseeds, Chia seeds, beans, popcorn, nuts, oatmeal are good sources of fiber 9.  Use vinegar in your diet, i.e. apple cider vinegar, red wine or balsamic vinegar 10.  You can try fasting.  For example you can skip breakfast and lunch every other day (24-hour fast) 11.  Stress reduction, good night sleep, relaxation, meditation, yoga and other physical activity is likely to help you to maintain low weight too. 12.  If you drink alcohol, limit your alcohol intake to no more than 2 drinks a day.   Mediterranean diet is good for you. ([employer] has a typical Mediterranean cuisine menu) The Mediterranean diet is a way of eating based on the traditional cuisine of countries bordering the [employer]. While there is no single definition of the Mediterranean diet, it is typically high  in vegetables, fruits, whole grains, beans, nut and seeds, and olive oil. The main components of Mediterranean diet include: Marland Kitchen Daily consumption of vegetables, fruits, whole grains and healthy fats  . Weekly intake of fish, poultry, beans and eggs  . Moderate portions of dairy products  . Limited intake of red meat Other important elements of the Mediterranean diet are sharing meals with family and friends, enjoying a glass of red wine and being physically active. Health benefits of a Mediterranean diet: A traditional Mediterranean diet consisting of large quantities of fresh fruits and vegetables, nuts, fish and olive oil-coupled with physical activity-can reduce your risk of serious mental and physical health problems by: Preventing heart disease and strokes. Following a Mediterranean diet limits your intake of refined breads, processed foods, and red meat, and encourages drinking red wine instead of hard liquor-all factors that can help prevent heart disease and stroke. Keeping you agile. If you're an older adult, the nutrients gained with a Mediterranean diet may reduce your risk of developing muscle weakness and other signs of frailty by about 70 percent. Reducing the risk of Alzheimer's. Research suggests that the [place] diet may improve cholesterol, blood sugar levels, and overall blood vessel health, which in turn may reduce your risk of Alzheimer's disease or dementia. Halving the risk of Parkinson's disease. The high levels of antioxidants in the Mediterranean diet can prevent cells from undergoing a damaging process called oxidative stress, thereby cutting the risk of Parkinson's disease in half. Increasing longevity. By reducing your risk of developing heart  disease or cancer with the Mediterranean diet, you're reducing your risk of death at any age by 20%. Protecting against type 2 diabetes. A Mediterranean diet is rich in fiber which digests slowly, prevents huge swings in blood  sugar, and can help you maintain a healthy weight.    Cabbage soup recipe that will not make you gain weight: Take 1 small head of cabbage, 1 average pack of celery, 4 green peppers, 4 onions, 2 cans diced tomatoes (they are not available without salt), salt and spices to taste.  Chop cabbage, celery, peppers and onions.  And tomatoes and 2-2.5 liters (2.5 quarts) of water so that it would just cover the vegetables.  Bring to boil.  Add spices and salt.  Turn heat to low/medium and simmer for 20-25 minutes.  Naturally, you can make a smaller batch and change some of the ingredients.

## 2019-07-25 NOTE — Assessment & Plan Note (Signed)
Klonopin prn  Potential benefits of a long term benzodiazepines  use as well as potential risks  and complications were explained to the patient and were aknowledged.

## 2019-07-25 NOTE — Assessment & Plan Note (Signed)
Amlodipine 2.5 mg/d, Coreg, Benicar

## 2019-07-25 NOTE — Assessment & Plan Note (Signed)
No angiana

## 2019-07-25 NOTE — Progress Notes (Signed)
Subjective:  Patient ID: Scott Oneal, male    DOB: 09-12-1941  Age: 78 y.o. MRN: [id number]  CC: No chief complaint on file.   HPI Scott Oneal presents for sweats at night x 3 weeks C/o fatigue. F/u HTN, CAD    Outpatient Medications Prior to Visit  Medication Sig Dispense Refill  . aspirin 81 MG tablet Take 81 mg by mouth every evening.     . B-D 3CC LUER-LOK SYR 21GX1" 21G X 1" 3 ML MISC USE EVERY 14 DAYS AS DIRECTED 50 each 2  . calcitRIOL (ROCALTROL) 0.25 MCG capsule TAKE 1 CAPSULE BY MOUTH DAILY. ANNUAL APPT DUE IN OCT MUST SEE MD FOR REFILLS 90 capsule 1  . carvedilol (COREG) 25 MG tablet TAKE 1 TABLET BY MOUTH TWICE A DAY WITH MEALS 180 tablet 3  . Cholecalciferol 2000 UNITS TABS Take 2,000 Units by mouth daily.     . clonazePAM (KLONOPIN) 0.25 MG disintegrating tablet TAKE 1 TABLET (0.25 MG TOTAL) BY MOUTH 2 (TWO) TIMES DAILY AS NEEDED (ANXIETY). 60 tablet 3  . diclofenac sodium (VOLTAREN) 1 % GEL APPLY 4 G TOPICALLY 4 TIMES DAILY. 200 g 5  . levothyroxine (SYNTHROID, LEVOTHROID) 150 MCG tablet TAKE 1 TABLET BY MOUTH EVERY DAY 90 tablet 3  . Multiple Vitamin (MULTIVITAMIN) capsule Take 1 capsule by mouth daily.    . nitroGLYCERIN (NITROSTAT) 0.4 MG SL tablet Place 0.4 mg under the tongue every 5 (five) minutes as needed for chest pain.    Marland Kitchen olmesartan (BENICAR) 20 MG tablet TAKE 1 TABLET (20 MG TOTAL) BY MOUTH DAILY. 90 tablet 3  . sildenafil (VIAGRA) 100 MG tablet Take 100 mg by mouth daily as needed for erectile dysfunction.     . simvastatin (ZOCOR) 10 MG tablet TAKE 1 TABLET BY MOUTH EVERYDAY AT BEDTIME 90 tablet 3  . testosterone cypionate (DEPOTESTOSTERONE CYPIONATE) 200 MG/ML injection INJECT 2ML INTO MUSCLE EVERY 2 WEEKS 12 mL 1   No facility-administered medications prior to visit.     ROS: Review of Systems  Constitutional: Positive for diaphoresis and fatigue. Negative for appetite change and unexpected weight change.  HENT: Negative for congestion,  nosebleeds, sneezing, sore throat and trouble swallowing.   Eyes: Negative for itching and visual disturbance.  Respiratory: Negative for cough.   Cardiovascular: Negative for chest pain, palpitations and leg swelling.  Gastrointestinal: Negative for abdominal distention, blood in stool, diarrhea and nausea.  Genitourinary: Negative for frequency and hematuria.  Musculoskeletal: Positive for arthralgias and gait problem. Negative for back pain, joint swelling and neck pain.  Skin: Negative for color change, pallor and rash.  Neurological: Negative for dizziness, tremors, speech difficulty, weakness and headaches.  Hematological: Negative for adenopathy. Does not bruise/bleed easily.  Psychiatric/Behavioral: Negative for agitation, confusion, dysphoric mood, sleep disturbance and suicidal ideas. The patient is not nervous/anxious.     Objective:  BP (!) 146/84 (BP Location: Left Arm, Patient Position: Sitting, Cuff Size: Normal)   Pulse 72   Temp 98.1 F (36.7 C) (Oral)   Ht 6' (1.829 m)   Wt 217 lb (98.4 kg)   SpO2 97%   BMI 29.43 kg/m   BP Readings from Last 3 Encounters:  07/25/19 (!) 146/84  04/24/19 (!) 144/78  11/13/18 128/74    Wt Readings from Last 3 Encounters:  07/25/19 217 lb (98.4 kg)  04/24/19 221 lb (100.2 kg)  11/13/18 222 lb (100.7 kg)    Physical Exam Constitutional:      General: He  is not in acute distress.    Appearance: He is well-developed.     Comments: NAD  Eyes:     Conjunctiva/sclera: Conjunctivae normal.     Pupils: Pupils are equal, round, and reactive to light.  Neck:     Musculoskeletal: Normal range of motion.     Thyroid: No thyromegaly.     Vascular: No JVD.  Cardiovascular:     Rate and Rhythm: Normal rate and regular rhythm.     Heart sounds: Normal heart sounds. No murmur. No friction rub. No gallop.   Pulmonary:     Effort: Pulmonary effort is normal. No respiratory distress.     Breath sounds: Normal breath sounds. No wheezing  or rales.  Chest:     Chest wall: No tenderness.  Abdominal:     General: Bowel sounds are normal. There is no distension.     Palpations: Abdomen is soft. There is no mass.     Tenderness: There is no abdominal tenderness. There is no guarding or rebound.  Musculoskeletal: Normal range of motion.        General: No tenderness.  Lymphadenopathy:     Cervical: No cervical adenopathy.  Skin:    General: Skin is warm and dry.     Findings: No rash.  Neurological:     Mental Status: He is alert and oriented to person, place, and time.     Cranial Nerves: No cranial nerve deficit.     Motor: No abnormal muscle tone.     Coordination: Coordination normal.     Gait: Gait normal.     Deep Tendon Reflexes: Reflexes are normal and symmetric.  Psychiatric:        Behavior: Behavior normal.        Thought Content: Thought content normal.        Judgment: Judgment normal.     Lab Results  Component Value Date   WBC 9.0 11/01/2017   HGB 16.8 11/01/2017   HCT 50.9 11/01/2017   PLT 123.0 (L) 11/01/2017   GLUCOSE 99 04/24/2019   CHOL 118 05/18/2017   TRIG 82.0 05/18/2017   HDL 40.60 05/18/2017   LDLCALC 61 05/18/2017   ALT 17 05/18/2017   AST 21 05/18/2017   NA 141 04/24/2019   K 4.8 04/24/2019   CL 104 04/24/2019   CREATININE 1.78 (H) 04/24/2019   BUN 15 04/24/2019   CO2 31 04/24/2019   TSH 4.28 11/29/2017   PSA 2.64 04/24/2019   INR 1.20 11/26/2014   HGBA1C 5.9 03/15/2016   MICROALBUR 1.2 05/18/2017    Dg Hand Complete Right  Result Date: 04/24/2019 CLINICAL DATA:  MCP pain at middle finger for 2 weeks, twisted it while working with pain since EXAM: RIGHT HAND - COMPLETE 3+ VIEW COMPARISON:  None FINDINGS: Osseous mineralization normal. Scattered narrowing of IP joints and at distal pole of scaphoid. Small corticated old erosion at the base of the proximal phalanx RIGHT little finger. No acute fracture, dislocation or additional bone destruction. IMPRESSION: No acute osseous  abnormalities. Old appearing corticated erosion at fifth MCP joint. Electronically Signed   By: Lavonia Dana M.D.   On: 04/24/2019 14:19    Assessment & Plan:   There are no diagnoses linked to this encounter.   No orders of the defined types were placed in this encounter.    Follow-up: No follow-ups on file.  Walker Kehr, MD

## 2019-07-25 NOTE — Assessment & Plan Note (Signed)
BMET 

## 2019-07-25 NOTE — Assessment & Plan Note (Signed)
x3 weeks ?etiology Labs, CXR

## 2019-07-25 NOTE — Assessment & Plan Note (Signed)
Labs Levothroid

## 2019-08-05 ENCOUNTER — Encounter: Payer: Self-pay | Admitting: Cardiovascular Disease

## 2019-08-05 ENCOUNTER — Ambulatory Visit: Payer: PPO | Admitting: Cardiovascular Disease

## 2019-08-05 ENCOUNTER — Other Ambulatory Visit: Payer: Self-pay

## 2019-08-05 VITALS — BP 142/90 | HR 66 | Ht 72.0 in | Wt 218.8 lb

## 2019-08-05 DIAGNOSIS — I10 Essential (primary) hypertension: Secondary | ICD-10-CM | POA: Diagnosis not present

## 2019-08-05 DIAGNOSIS — E782 Mixed hyperlipidemia: Secondary | ICD-10-CM | POA: Diagnosis not present

## 2019-08-05 DIAGNOSIS — I6523 Occlusion and stenosis of bilateral carotid arteries: Secondary | ICD-10-CM | POA: Diagnosis not present

## 2019-08-05 DIAGNOSIS — I2510 Atherosclerotic heart disease of native coronary artery without angina pectoris: Secondary | ICD-10-CM

## 2019-08-05 NOTE — Progress Notes (Signed)
Cardiology Office Note:    Date:  08/06/2019   ID:  Scott Oneal, Scott Oneal [birth year], MRN [id number]  PCP:  Cassandria Anger, MD  Cardiologist:  Sherren Mocha, MD  Electrophysiologist:  None   Referring MD: Cassandria Anger, MD   Chief Complaint  Patient presents with  . Coronary Artery Disease    History of Present Illness:    Scott Oneal is a 78 y.o. male with a hx of coronary artery disease. Other medical problems include chronic kidney disease, hypertension, and hyperlipidemia.  He initially presented in 2005 with an inferior wall myocardial infarction. He was treated with overlapping Cypher stents in the right coronary artery. He underwent staged PCI of the left circumflex during his index hospitalization. He had been maintained on long-term dual antiplatelet therapy with aspirin and Plavix until last year when clopidogrel was discontinued at the time of a noncardiac procedure. He was noted to have diffuse nonobstructive disease of the LAD. LV function was preserved at the time of his myocardial infarction with an estimated ejection fraction of 60%.  He is here alone today. He's having some mild symptoms in his left leg. He complains of tingling and numbness in the left leg. No typical symptoms of claudication. He reports occasional wheezing, but no exertional dyspnea. No chest pain or pressure. No leg swelling, orthopnea, or PND.   Past Medical History:  Diagnosis Date  . Allergy   . Anxiety   . Arthritis   . Asthma   . Benign neoplasm of colon   . Calculus of gallbladder without mention of cholecystitis   . Cataract    bilateral cateracts removed  . Celiac disease   . Cholelithiasis   . Coronary atherosclerosis of unspecified type of vessel, native or graft   . Diarrhea   . Esophageal reflux   . Glaucoma   . Gout, unspecified   . Hyperparathyroidism   . Long term (current) use of anticoagulants   . Loss of weight   . Old myocardial infarction   .  Osteoporosis, unspecified   . Other malaise and fatigue   . Other specified cardiac dysrhythmias(427.89)   . Other testicular hypofunction   . Personal history of colonic polyps   . Pure hypercholesterolemia   . Renal insufficiency    chronic  . Unspecified asthma(493.90)   . Unspecified essential hypertension   . Unspecified hypothyroidism     Past Surgical History:  Procedure Laterality Date  . cataract surgery Bilateral   . COLONOSCOPY    . COLONOSCOPY WITH PROPOFOL N/A 11/27/2014   Procedure: COLONOSCOPY WITH PROPOFOL;  Surgeon: Milus Banister, MD;  Location: [facility];  Service: Endoscopy;  Laterality: N/A;  . CORONARY ANGIOPLASTY    . CORONARY STENT PLACEMENT  2011   Cypher; in distal circumflex artery  . CYSTOSCOPY  1998  . EYE SURGERY    . KNEE SURGERY     right  . SHOULDER SURGERY Left 9/14   Dr Shara Blazing  . UPPER GASTROINTESTINAL ENDOSCOPY    . VASCULAR SURGERY      Current Medications: Current Meds  Medication Sig  . aspirin 81 MG tablet Take 81 mg by mouth every evening.   . B-D 3CC LUER-LOK SYR 21GX1" 21G X 1" 3 ML MISC USE EVERY 14 DAYS AS DIRECTED  . calcitRIOL (ROCALTROL) 0.25 MCG capsule TAKE 1 CAPSULE BY MOUTH DAILY. ANNUAL APPT DUE IN OCT MUST SEE MD FOR REFILLS  . carvedilol (COREG) 25 MG tablet TAKE 1  TABLET BY MOUTH TWICE A DAY WITH MEALS  . Cholecalciferol 2000 UNITS TABS Take 2,000 Units by mouth daily.   . clonazePAM (KLONOPIN) 0.25 MG disintegrating tablet TAKE 1 TABLET (0.25 MG TOTAL) BY MOUTH 2 (TWO) TIMES DAILY AS NEEDED (ANXIETY).  Marland Kitchen diclofenac sodium (VOLTAREN) 1 % GEL APPLY 4 G TOPICALLY 4 TIMES DAILY.  Marland Kitchen levothyroxine (SYNTHROID, LEVOTHROID) 150 MCG tablet TAKE 1 TABLET BY MOUTH EVERY DAY  . Multiple Vitamin (MULTIVITAMIN) capsule Take 1 capsule by mouth daily.  . nitroGLYCERIN (NITROSTAT) 0.4 MG SL tablet Place 0.4 mg under the tongue every 5 (five) minutes as needed for chest pain.  Marland Kitchen olmesartan (BENICAR) 20 MG tablet TAKE 1 TABLET (20  MG TOTAL) BY MOUTH DAILY.  . sildenafil (VIAGRA) 100 MG tablet Take 100 mg by mouth daily as needed for erectile dysfunction.   . simvastatin (ZOCOR) 10 MG tablet TAKE 1 TABLET BY MOUTH EVERYDAY AT BEDTIME  . testosterone cypionate (DEPOTESTOSTERONE CYPIONATE) 200 MG/ML injection INJECT 2ML INTO MUSCLE EVERY 2 WEEKS     Allergies:   Amlodipine, Benzalkonium chloride, Biaxin [clarithromycin], Allantoin-pramoxine, and Metoprolol tartrate   Social History   Socioeconomic History  . Marital status: Married    Spouse name: [name]  . Number of children: 3  . Years of education: Not on file  . Highest education level: Not on file  Occupational History  . Occupation: retired    Fish farm manager: RETIRED    CommentYouth worker  . Financial resource strain: Not hard at all  . Food insecurity    Worry: Never true    Inability: Never true  . Transportation needs    Medical: No    Non-medical: No  Tobacco Use  . Smoking status: Never Smoker  . Smokeless tobacco: Never Used  Substance and Sexual Activity  . Alcohol use: No  . Drug use: No  . Sexual activity: Yes  Lifestyle  . Physical activity    Days per week: 3 days    Minutes per session: 60 min  . Stress: Only a little  Relationships  . Social connections    Talks on phone: More than three times a week    Gets together: More than three times a week    Attends religious service: 1 to 4 times per year    Active member of club or organization: Yes    Attends meetings of clubs or organizations: 1 to 4 times per year    Relationship status: Married  Other Topics Concern  . Not on file  Social History Narrative   Retired.   Regular Exercise-Yes; yoga & silver sneakers   Daily Caffeine Use.              Family History: The patient's family history includes Cancer in his father; Colon cancer in his son; Hypertension in an other family member; Lymphoma in his father; Vision loss in his maternal grandmother. There  is no history of Asthma, Esophageal cancer, Rectal cancer, or Stomach cancer.  ROS:   Please see the history of present illness.    All other systems reviewed and are negative.  EKGs/Labs/Other Studies Reviewed:    The following studies were reviewed today: CXR 08-21-19: FINDINGS: The heart size and mediastinal contours are within normal limits. Both lungs are clear. Elevation of the left diaphragm. The visualized skeletal structures are unremarkable.  IMPRESSION: No active cardiopulmonary disease.  EKG:  EKG is ordered today.  The ekg ordered today demonstrates NSR 66 bpm,  moderate LVH may be normal varient, age-indeterminate inferior infarct  Recent Labs: 07/25/2019: ALT 28; BUN 17; Creatinine, Ser 1.79; Hemoglobin 15.8; Platelets 69.0; Potassium 4.8; Sodium 141; TSH 3.93  Recent Lipid Panel    Component Value Date/Time   CHOL 118 05/18/2017 0721   TRIG 82.0 05/18/2017 0721   HDL 40.60 05/18/2017 0721   CHOLHDL 3 05/18/2017 0721   VLDL 16.4 05/18/2017 0721   LDLCALC 61 05/18/2017 0721    Physical Exam:    VS:  BP (!) 142/90   Pulse 66   Ht 6' (1.829 m)   Wt 218 lb 12.8 oz (99.2 kg)   SpO2 97%   BMI 29.67 kg/m     Wt Readings from Last 3 Encounters:  08/05/19 218 lb 12.8 oz (99.2 kg)  07/25/19 217 lb (98.4 kg)  04/24/19 221 lb (100.2 kg)     GEN:  Well nourished, well developed in no acute distress HEENT: Normal NECK: No JVD; No carotid bruits LYMPHATICS: No lymphadenopathy CARDIAC: RRR, no murmurs, rubs, gallops RESPIRATORY:  Clear to auscultation without rales, wheezing or rhonchi  ABDOMEN: Soft, non-tender, non-distended MUSCULOSKELETAL:  No edema; No deformity  SKIN: Warm and dry.  DP pulses are 2+ bilaterally NEUROLOGIC:  Alert and oriented x 3 PSYCHIATRIC:  Normal affect   ASSESSMENT:    1. Bilateral carotid artery stenosis   2. Coronary artery disease involving native coronary artery of native heart without angina pectoris   3. Essential  hypertension   4. Mixed hyperlipidemia    PLAN:    In order of problems listed above:  1. Last carotid duplex reviewed from 2015.  Recommend repeat study.  No stroke/TIA symptoms.  Medical therapy reviewed as above and appropriate. 2. No anginal symptoms.  Continue current medical program which includes aspirin for antiplatelet therapy, beta-blocker, and a high intensity statin drug. 3. Blood pressure well controlled on current medications 4. Treated with a statin drug, LDL at goal (61 mg/dL).  Medication Adjustments/Labs and Tests Ordered: Current medicines are reviewed at length with the patient today.  Concerns regarding medicines are outlined above.  Orders Placed This Encounter  Procedures  . EKG 12-Lead  . VAS US CAROTID   No orders of the defined types were placed in this encounter.   Patient Instructions  Medication Instructions:  Your provider recommends that you continue on your current medications as directed. Please refer to the Current Medication list given to you today.    Labwork: None  Testing/Procedures: Your physician has requested that you have a carotid duplex. This test is an ultrasound of the carotid arteries in your neck. It looks at blood flow through these arteries that supply the brain with blood. Allow one hour for this exam. There are no restrictions or special instructions.  Follow-Up: Your provider wants you to follow-up in: 1 year with Dr. Burt Knack. You will receive a reminder letter in the mail two months in advance. If you don't receive a letter, please call our office to schedule the follow-up appointment.      Signed, Sherren Mocha, MD  08/06/2019 8:55 AM    [place]

## 2019-08-05 NOTE — Patient Instructions (Signed)
Medication Instructions:  Your provider recommends that you continue on your current medications as directed. Please refer to the Current Medication list given to you today.    Labwork: None  Testing/Procedures: Your physician has requested that you have a carotid duplex. This test is an ultrasound of the carotid arteries in your neck. It looks at blood flow through these arteries that supply the brain with blood. Allow one hour for this exam. There are no restrictions or special instructions.  Follow-Up: Your provider wants you to follow-up in: 1 year with Dr. Burt Knack. You will receive a reminder letter in the mail two months in advance. If you don't receive a letter, please call our office to schedule the follow-up appointment.

## 2019-08-06 ENCOUNTER — Encounter: Payer: Self-pay | Admitting: Cardiovascular Disease

## 2019-08-07 ENCOUNTER — Ambulatory Visit (INDEPENDENT_AMBULATORY_CARE_PROVIDER_SITE_OTHER): Payer: PPO | Admitting: Internal Medicine

## 2019-08-07 ENCOUNTER — Encounter: Payer: Self-pay | Admitting: Internal Medicine

## 2019-08-07 ENCOUNTER — Other Ambulatory Visit: Payer: Self-pay

## 2019-08-07 VITALS — BP 140/90 | HR 68 | Temp 98.0°F | Ht 72.0 in | Wt 218.0 lb

## 2019-08-07 DIAGNOSIS — M7062 Trochanteric bursitis, left hip: Secondary | ICD-10-CM | POA: Diagnosis not present

## 2019-08-07 NOTE — Patient Instructions (Signed)
Postprocedure instructions :    A Band-Aid should be left on for 12 hours. Injection therapy is not a cure itself. It is used in conjunction with other modalities. You can use nonsteroidal anti-inflammatories like ibuprofen , hot and cold compresses. Rest is recommended in the next 24 hours. You need to report immediately  if fever, chills or any signs of infection develop. 

## 2019-08-07 NOTE — Progress Notes (Signed)
Subjective:  Patient ID: Scott Oneal, male    DOB: Nov 24, 1940  Age: 78 y.o. MRN: [id number]  CC: No chief complaint on file.   HPI Scott Oneal presents for L hip pain - not better  Outpatient Medications Prior to Visit  Medication Sig Dispense Refill   aspirin 81 MG tablet Take 81 mg by mouth every evening.      B-D 3CC LUER-LOK SYR 21GX1" 21G X 1" 3 ML MISC USE EVERY 14 DAYS AS DIRECTED 50 each 2   calcitRIOL (ROCALTROL) 0.25 MCG capsule TAKE 1 CAPSULE BY MOUTH DAILY. ANNUAL APPT DUE IN OCT MUST SEE MD FOR REFILLS 90 capsule 1   carvedilol (COREG) 25 MG tablet TAKE 1 TABLET BY MOUTH TWICE A DAY WITH MEALS 180 tablet 3   Cholecalciferol 2000 UNITS TABS Take 2,000 Units by mouth daily.      clonazePAM (KLONOPIN) 0.25 MG disintegrating tablet TAKE 1 TABLET (0.25 MG TOTAL) BY MOUTH 2 (TWO) TIMES DAILY AS NEEDED (ANXIETY). 60 tablet 3   diclofenac sodium (VOLTAREN) 1 % GEL APPLY 4 G TOPICALLY 4 TIMES DAILY. 200 g 5   levothyroxine (SYNTHROID, LEVOTHROID) 150 MCG tablet TAKE 1 TABLET BY MOUTH EVERY DAY 90 tablet 3   Multiple Vitamin (MULTIVITAMIN) capsule Take 1 capsule by mouth daily.     nitroGLYCERIN (NITROSTAT) 0.4 MG SL tablet Place 0.4 mg under the tongue every 5 (five) minutes as needed for chest pain.     olmesartan (BENICAR) 20 MG tablet TAKE 1 TABLET (20 MG TOTAL) BY MOUTH DAILY. 90 tablet 3   sildenafil (VIAGRA) 100 MG tablet Take 100 mg by mouth daily as needed for erectile dysfunction.      simvastatin (ZOCOR) 10 MG tablet TAKE 1 TABLET BY MOUTH EVERYDAY AT BEDTIME 90 tablet 3   testosterone cypionate (DEPOTESTOSTERONE CYPIONATE) 200 MG/ML injection INJECT 2ML INTO MUSCLE EVERY 2 WEEKS 12 mL 1   No facility-administered medications prior to visit.     ROS: Review of Systems  Constitutional: Negative for appetite change, fatigue and unexpected weight change.  HENT: Negative for congestion, nosebleeds, sneezing, sore throat and trouble swallowing.   Eyes:  Negative for itching and visual disturbance.  Respiratory: Negative for cough.   Cardiovascular: Negative for chest pain, palpitations and leg swelling.  Gastrointestinal: Negative for abdominal distention, blood in stool, diarrhea and nausea.  Genitourinary: Negative for frequency and hematuria.  Musculoskeletal: Positive for arthralgias and gait problem. Negative for back pain, joint swelling and neck pain.  Skin: Negative for rash.  Neurological: Negative for dizziness, tremors, speech difficulty and weakness.  Psychiatric/Behavioral: Negative for agitation, dysphoric mood and sleep disturbance. The patient is not nervous/anxious.     Objective:  BP 140/90 (BP Location: Left Arm, Patient Position: Sitting, Cuff Size: Large)    Pulse 68    Temp 98 F (36.7 C) (Oral)    Ht 6' (1.829 m)    Wt 218 lb (98.9 kg)    SpO2 98%    BMI 29.57 kg/m   BP Readings from Last 3 Encounters:  08/07/19 140/90  08/05/19 (!) 142/90  07/25/19 (!) 146/84    Wt Readings from Last 3 Encounters:  08/07/19 218 lb (98.9 kg)  08/05/19 218 lb 12.8 oz (99.2 kg)  07/25/19 217 lb (98.4 kg)    Physical Exam Constitutional:      Appearance: He is obese.  Musculoskeletal:        General: Tenderness present.  Neurological:     Mental  Status: He is alert.   L lat hip - painful   Procedure Note :     Procedure : Joint Injection, L   hip   Indication:  Trochanteric bursitis with refractory  chronic pain.   Risks including unsuccessful procedure , bleeding, infection, bruising, skin atrophy, "steroid flare-up" and others were explained to the patient in detail as well as the benefits. Informed consent was obtained and signed.   Tthe patient was placed in a comfortable lateral decubitus position. The point of maximal tenderness was identified. Skin was prepped with Betadine and alcohol. Then, a 5 cc syringe with a 2 inch long 24-gauge needle was used for a bursa injection.. The needle was advanced  Into the  bursa. I injected the bursa with 4 mL of 2% lidocaine and 80 mg of Depo-Medrol .  Band-Aid was applied.   Tolerated well. Complications: None. Good pain relief following the procedure.   Postprocedure instructions :    A Band-Aid should be left on for 12 hours. Injection therapy is not a cure itself. It is used in conjunction with other modalities. You can use nonsteroidal anti-inflammatories like ibuprofen , hot and cold compresses. Rest is recommended in the next 24 hours. You need to report immediately  if fever, chills or any signs of infection develop.    Lab Results  Component Value Date   WBC 4.7 07/25/2019   HGB 15.8 07/25/2019   HCT 48.5 07/25/2019   PLT 69.0 (L) 07/25/2019   GLUCOSE 94 07/25/2019   CHOL 118 05/18/2017   TRIG 82.0 05/18/2017   HDL 40.60 05/18/2017   LDLCALC 61 05/18/2017   ALT 28 07/25/2019   AST 28 07/25/2019   NA 141 07/25/2019   K 4.8 07/25/2019   CL 103 07/25/2019   CREATININE 1.79 (H) 07/25/2019   BUN 17 07/25/2019   CO2 30 07/25/2019   TSH 3.93 07/25/2019   PSA 2.64 04/24/2019   INR 1.20 11/26/2014   HGBA1C 5.9 03/15/2016   MICROALBUR 1.2 05/18/2017    Dg Chest 2 View  Result Date: 07/25/2019 CLINICAL DATA:  Night sweats for 3 weeks EXAM: CHEST - 2 VIEW COMPARISON:  10/23/2017 FINDINGS: The heart size and mediastinal contours are within normal limits. Both lungs are clear. Elevation of the left diaphragm. The visualized skeletal structures are unremarkable. IMPRESSION: No active cardiopulmonary disease. Electronically Signed   By: Kathreen Devoid   On: 07/25/2019 14:01    Assessment & Plan:   There are no diagnoses linked to this encounter.   No orders of the defined types were placed in this encounter.    Follow-up: No follow-ups on file.  Walker Kehr, MD

## 2019-08-07 NOTE — Assessment & Plan Note (Signed)
Will inject - see procedure

## 2019-08-08 DIAGNOSIS — M1711 Unilateral primary osteoarthritis, right knee: Secondary | ICD-10-CM | POA: Diagnosis not present

## 2019-08-09 ENCOUNTER — Other Ambulatory Visit: Payer: Self-pay

## 2019-08-09 ENCOUNTER — Ambulatory Visit (HOSPITAL_COMMUNITY)
Admission: RE | Admit: 2019-08-09 | Discharge: 2019-08-09 | Disposition: A | Discharge status: Home / Self Care | Payer: PPO | Source: Ambulatory Visit | Attending: Cardiology | Admitting: Cardiology

## 2019-08-09 DIAGNOSIS — I6523 Occlusion and stenosis of bilateral carotid arteries: Secondary | ICD-10-CM | POA: Diagnosis not present

## 2019-08-15 DIAGNOSIS — Z6841 Body Mass Index (BMI) 40.0 and over, adult: Secondary | ICD-10-CM | POA: Diagnosis not present

## 2019-08-15 DIAGNOSIS — R3989 Other symptoms and signs involving the genitourinary system: Secondary | ICD-10-CM | POA: Diagnosis not present

## 2019-08-15 DIAGNOSIS — N183 Chronic kidney disease, stage 3 unspecified: Secondary | ICD-10-CM | POA: Diagnosis not present

## 2019-08-15 DIAGNOSIS — I129 Hypertensive chronic kidney disease with stage 1 through stage 4 chronic kidney disease, or unspecified chronic kidney disease: Secondary | ICD-10-CM | POA: Diagnosis not present

## 2019-08-15 DIAGNOSIS — R319 Hematuria, unspecified: Secondary | ICD-10-CM | POA: Diagnosis not present

## 2019-08-16 DIAGNOSIS — M1711 Unilateral primary osteoarthritis, right knee: Secondary | ICD-10-CM | POA: Diagnosis not present

## 2019-08-23 DIAGNOSIS — M1711 Unilateral primary osteoarthritis, right knee: Secondary | ICD-10-CM | POA: Diagnosis not present

## 2019-08-23 DIAGNOSIS — M25561 Pain in right knee: Secondary | ICD-10-CM | POA: Diagnosis not present

## 2019-08-28 ENCOUNTER — Other Ambulatory Visit: Payer: Self-pay | Admitting: Internal Medicine

## 2019-10-07 DIAGNOSIS — H353112 Nonexudative age-related macular degeneration, right eye, intermediate dry stage: Secondary | ICD-10-CM | POA: Diagnosis not present

## 2019-10-07 DIAGNOSIS — H353121 Nonexudative age-related macular degeneration, left eye, early dry stage: Secondary | ICD-10-CM | POA: Diagnosis not present

## 2019-10-10 ENCOUNTER — Other Ambulatory Visit: Payer: Self-pay

## 2019-10-10 ENCOUNTER — Encounter: Payer: Self-pay | Admitting: Internal Medicine

## 2019-10-10 ENCOUNTER — Ambulatory Visit (INDEPENDENT_AMBULATORY_CARE_PROVIDER_SITE_OTHER): Payer: PPO | Admitting: Internal Medicine

## 2019-10-10 DIAGNOSIS — F411 Generalized anxiety disorder: Secondary | ICD-10-CM

## 2019-10-10 DIAGNOSIS — M7062 Trochanteric bursitis, left hip: Secondary | ICD-10-CM

## 2019-10-10 DIAGNOSIS — I2510 Atherosclerotic heart disease of native coronary artery without angina pectoris: Secondary | ICD-10-CM | POA: Diagnosis not present

## 2019-10-10 DIAGNOSIS — M25559 Pain in unspecified hip: Secondary | ICD-10-CM

## 2019-10-10 DIAGNOSIS — L570 Actinic keratosis: Secondary | ICD-10-CM | POA: Diagnosis not present

## 2019-10-10 DIAGNOSIS — E291 Testicular hypofunction: Secondary | ICD-10-CM

## 2019-10-10 DIAGNOSIS — K900 Celiac disease: Secondary | ICD-10-CM | POA: Diagnosis not present

## 2019-10-10 MED ORDER — ESCITALOPRAM OXALATE 5 MG PO TABS: 5.0000 mg | ORAL_TABLET | Freq: Every day | ORAL | 5 refills | Status: DC

## 2019-10-10 NOTE — Assessment & Plan Note (Signed)
F/u w/Dr Diona Fanti

## 2019-10-10 NOTE — Assessment & Plan Note (Signed)
start Lexapro

## 2019-10-10 NOTE — Assessment & Plan Note (Signed)
L hip - chronic Worse - will inject

## 2019-10-10 NOTE — Assessment & Plan Note (Signed)
F/u w/dr Burt Knack

## 2019-10-10 NOTE — Assessment & Plan Note (Addendum)
Worse Will inject

## 2019-10-10 NOTE — Assessment & Plan Note (Signed)
On gluten free diet

## 2019-10-10 NOTE — Progress Notes (Signed)
Subjective:  Patient ID: Scott Oneal, male    DOB: [birth year]  Age: 78 y.o. MRN: [id number]  CC: No chief complaint on file.   HPI Scott Oneal presents for CAD, HTN C/o anxiety, insomnia - worse L hip pain is worsen- asking to inject C/o AKs  Outpatient Medications Prior to Visit  Medication Sig Dispense Refill  . aspirin 81 MG tablet Take 81 mg by mouth every evening.     . B-D 3CC LUER-LOK SYR 21GX1" 21G X 1" 3 ML MISC USE EVERY 14 DAYS AS DIRECTED 50 each 2  . calcitRIOL (ROCALTROL) 0.25 MCG capsule TAKE 1 CAPSULE BY MOUTH DAILY. ANNUAL APPT DUE IN OCT MUST SEE MD FOR REFILLS 90 capsule 1  . carvedilol (COREG) 25 MG tablet TAKE 1 TABLET BY MOUTH TWICE A DAY WITH MEALS 180 tablet 3  . Cholecalciferol 2000 UNITS TABS Take 2,000 Units by mouth daily.     . clonazePAM (KLONOPIN) 0.25 MG disintegrating tablet TAKE 1 TABLET (0.25 MG TOTAL) BY MOUTH 2 (TWO) TIMES DAILY AS NEEDED (ANXIETY). 60 tablet 3  . diclofenac sodium (VOLTAREN) 1 % GEL APPLY 4 G TOPICALLY 4 TIMES DAILY. 200 g 5  . levothyroxine (SYNTHROID, LEVOTHROID) 150 MCG tablet TAKE 1 TABLET BY MOUTH EVERY DAY 90 tablet 3  . Multiple Vitamin (MULTIVITAMIN) capsule Take 1 capsule by mouth daily.    . nitroGLYCERIN (NITROSTAT) 0.4 MG SL tablet Place 0.4 mg under the tongue every 5 (five) minutes as needed for chest pain.    Marland Kitchen olmesartan (BENICAR) 20 MG tablet TAKE 1 TABLET (20 MG TOTAL) BY MOUTH DAILY. 90 tablet 3  . sildenafil (VIAGRA) 100 MG tablet Take 100 mg by mouth daily as needed for erectile dysfunction.     . simvastatin (ZOCOR) 10 MG tablet TAKE 1 TABLET BY MOUTH EVERYDAY AT BEDTIME 90 tablet 3  . testosterone cypionate (DEPOTESTOSTERONE CYPIONATE) 200 MG/ML injection INJECT 2ML INTO MUSCLE EVERY 2 WEEKS 12 mL 1   No facility-administered medications prior to visit.    ROS: Review of Systems  Constitutional: Negative for appetite change, fatigue and unexpected weight change.  HENT: Negative for congestion,  nosebleeds, sneezing, sore throat and trouble swallowing.   Eyes: Negative for itching and visual disturbance.  Respiratory: Negative for cough.   Cardiovascular: Negative for chest pain, palpitations and leg swelling.  Gastrointestinal: Negative for abdominal distention, blood in stool, diarrhea and nausea.  Genitourinary: Negative for frequency and hematuria.  Musculoskeletal: Positive for arthralgias, back pain and gait problem. Negative for joint swelling and neck pain.  Skin: Negative for rash.  Neurological: Negative for dizziness, tremors, speech difficulty and weakness.  Psychiatric/Behavioral: Positive for sleep disturbance. Negative for agitation, dysphoric mood and suicidal ideas. The patient is not nervous/anxious.     Objective:  BP 136/84 (BP Location: Left Arm, Patient Position: Sitting, Cuff Size: Large)   Pulse 68   Temp 98.1 F (36.7 C) (Oral)   Ht 6' (1.829 m)   Wt 220 lb (99.8 kg)   SpO2 97%   BMI 29.84 kg/m   BP Readings from Last 3 Encounters:  10/10/19 136/84  08/07/19 140/90  08/05/19 (!) 142/90    Wt Readings from Last 3 Encounters:  10/10/19 220 lb (99.8 kg)  08/07/19 218 lb (98.9 kg)  08/05/19 218 lb 12.8 oz (99.2 kg)    Physical Exam Constitutional:      General: He is not in acute distress.    Appearance: He is well-developed.  Comments: NAD  Eyes:     Conjunctiva/sclera: Conjunctivae normal.     Pupils: Pupils are equal, round, and reactive to light.  Neck:     Thyroid: No thyromegaly.     Vascular: No JVD.  Cardiovascular:     Rate and Rhythm: Normal rate and regular rhythm.     Heart sounds: Normal heart sounds. No murmur. No friction rub. No gallop.   Pulmonary:     Effort: Pulmonary effort is normal. No respiratory distress.     Breath sounds: Normal breath sounds. No wheezing or rales.  Chest:     Chest wall: No tenderness.  Abdominal:     General: Bowel sounds are normal. There is no distension.     Palpations: Abdomen is  soft. There is no mass.     Tenderness: There is no abdominal tenderness. There is no guarding or rebound.  Musculoskeletal:        General: Tenderness present. Normal range of motion.     Cervical back: Normal range of motion.  Lymphadenopathy:     Cervical: No cervical adenopathy.  Skin:    General: Skin is warm and dry.     Findings: No rash.  Neurological:     Mental Status: He is alert and oriented to person, place, and time.     Cranial Nerves: No cranial nerve deficit.     Motor: No abnormal muscle tone.     Coordination: Coordination normal.     Gait: Gait normal.     Deep Tendon Reflexes: Reflexes are normal and symmetric.  Psychiatric:        Behavior: Behavior normal.        Thought Content: Thought content normal.        Judgment: Judgment normal.     AKs L lat hip - painful  Procedure Note :     Procedure : Cryosurgery   Indication:  Actinic keratosis(es)   Risks including unsuccessful procedure , bleeding, infection, bruising, scar, a need for a repeat  procedure and others were explained to the patient in detail as well as the benefits. Informed consent was obtained verbally.   6  lesion(s)  on face was/were treated with liquid nitrogen on a Q-tip in a usual fasion . Band-Aid was applied and antibiotic ointment was given for a later use.   Tolerated well. Complications none.       Procedure Note :     Procedure : Joint Injection,  L  hip   Indication:  Trochanteric bursitis with refractory  chronic pain.   Risks including unsuccessful procedure , bleeding, infection, bruising, skin atrophy, "steroid flare-up" and others were explained to the patient in detail as well as the benefits. Informed consent was obtained and signed.   Tthe patient was placed in a comfortable lateral decubitus position. The point of maximal tenderness was identified. Skin was prepped with Betadine and alcohol. Then, a 5 cc syringe with a 2 inch long 24-gauge needle was used for a  bursa injection.. The needle was advanced  Into the bursa. I injected the bursa with 4 mL of 2% lidocaine and 80 mg of Depo-Medrol .  Band-Aid was applied.   Tolerated well. Complications: None. Good pain relief following the procedure.   Postprocedure instructions :    A Band-Aid should be left on for 12 hours. Injection therapy is not a cure itself. It is used in conjunction with other modalities. You can use nonsteroidal anti-inflammatories like ibuprofen , hot and cold  compresses. Rest is recommended in the next 24 hours. You need to report immediately  if fever, chills or any signs of infection develop.     Lab Results  Component Value Date   WBC 4.7 07/25/2019   HGB 15.8 07/25/2019   HCT 48.5 07/25/2019   PLT 69.0 (L) 07/25/2019   GLUCOSE 94 07/25/2019   CHOL 118 05/18/2017   TRIG 82.0 05/18/2017   HDL 40.60 05/18/2017   LDLCALC 61 05/18/2017   ALT 28 07/25/2019   AST 28 07/25/2019   NA 141 07/25/2019   K 4.8 07/25/2019   CL 103 07/25/2019   CREATININE 1.79 (H) 07/25/2019   BUN 17 07/25/2019   CO2 30 07/25/2019   TSH 3.93 07/25/2019   PSA 2.64 04/24/2019   INR 1.20 11/26/2014   HGBA1C 5.9 03/15/2016   MICROALBUR 1.2 05/18/2017    VAS US CAROTID  Result Date: 08/09/2019 Carotid Arterial Duplex Study Indications:  Carotid artery disease. Patient denies any recent cerebrovascular               symptoms. Risk Factors: Hypertension, hyperlipidemia, prior MI, coronary artery disease. Performing Technologist: Mariane Masters RVT  Examination Guidelines: A complete evaluation includes B-mode imaging, spectral Doppler, color Doppler, and power Doppler as needed of all accessible portions of each vessel. Bilateral testing is considered an integral part of a complete examination. Limited examinations for reoccurring indications may be performed as noted.  Right Carotid Findings: +----------+--------+--------+--------+------------------+--------+           PSV cm/sEDV  cm/sStenosisPlaque DescriptionComments +----------+--------+--------+--------+------------------+--------+ CCA Prox  87      14                                         +----------+--------+--------+--------+------------------+--------+ CCA Distal59      16                                         +----------+--------+--------+--------+------------------+--------+ ICA Prox  48      16              heterogenous               +----------+--------+--------+--------+------------------+--------+ ICA Mid   71      23      1-39%                              +----------+--------+--------+--------+------------------+--------+ ICA Distal54      21                                         +----------+--------+--------+--------+------------------+--------+ ECA       67      11                                         +----------+--------+--------+--------+------------------+--------+ +----------+--------+-------+----------------+-------------------+           PSV cm/sEDV cmsDescribe        Arm Pressure (mmHG) +----------+--------+-------+----------------+-------------------+ [id number]             Multiphasic, [id number]                 +----------+--------+-------+----------------+-------------------+ +---------+--------+--+--------+--+---------+  VertebralPSV cm/s43EDV cm/s14Antegrade +---------+--------+--+--------+--+---------+  Left Carotid Findings: +----------+-------+--------+--------+----------------------+------------------+           PSV    EDV cm/sStenosisPlaque Description    Comments                     cm/s                                                            +----------+-------+--------+--------+----------------------+------------------+ CCA Prox  91     14                                                       +----------+-------+--------+--------+----------------------+------------------+ CCA Distal73     14                                     intimal thickening +----------+-------+--------+--------+----------------------+------------------+ ICA Prox  67     23      1-39%   diffuse and                                                               heterogenous                             +----------+-------+--------+--------+----------------------+------------------+ ICA Mid   67     23                                                       +----------+-------+--------+--------+----------------------+------------------+ ICA Distal54     16                                                       +----------+-------+--------+--------+----------------------+------------------+ ECA       81     11                                                       +----------+-------+--------+--------+----------------------+------------------+ +----------+--------+--------+----------------+-------------------+           PSV cm/sEDV cm/sDescribe        Arm Pressure (mmHG) +----------+--------+--------+----------------+-------------------+ [id number]              Multiphasic, [id number]                 +----------+--------+--------+----------------+-------------------+ +---------+--------+--+--------+-+---------+ VertebralPSV cm/s31EDV cm/s8Antegrade +---------+--------+--+--------+-+---------+  Summary: Right Carotid: Velocities in the right ICA are  consistent with a 1-39% stenosis. Left Carotid: Velocities in the left ICA are consistent with a 1-39% stenosis. Vertebrals:  Bilateral vertebral arteries demonstrate antegrade flow. Subclavians: Normal flow hemodynamics were seen in bilateral subclavian              arteries. *See table(s) above for measurements and observations.  Electronically signed by Ena Dawley MD on 08/09/2019 at 3:39:13 PM.    Final     Assessment & Plan:   There are no diagnoses linked to this encounter.   No orders of the defined types were placed in this encounter.     Follow-up: No follow-ups on file.  Walker Kehr, MD

## 2019-11-02 ENCOUNTER — Other Ambulatory Visit: Payer: Self-pay | Admitting: Internal Medicine

## 2019-11-15 ENCOUNTER — Other Ambulatory Visit: Payer: Self-pay | Admitting: Internal Medicine

## 2020-01-08 ENCOUNTER — Ambulatory Visit (INDEPENDENT_AMBULATORY_CARE_PROVIDER_SITE_OTHER): Payer: PPO | Admitting: Internal Medicine

## 2020-01-08 ENCOUNTER — Other Ambulatory Visit: Payer: Self-pay

## 2020-01-08 ENCOUNTER — Encounter: Payer: Self-pay | Admitting: Internal Medicine

## 2020-01-08 DIAGNOSIS — R6884 Jaw pain: Secondary | ICD-10-CM

## 2020-01-08 DIAGNOSIS — F411 Generalized anxiety disorder: Secondary | ICD-10-CM

## 2020-01-08 DIAGNOSIS — R0789 Other chest pain: Secondary | ICD-10-CM

## 2020-01-08 DIAGNOSIS — I10 Essential (primary) hypertension: Secondary | ICD-10-CM

## 2020-01-08 DIAGNOSIS — I2510 Atherosclerotic heart disease of native coronary artery without angina pectoris: Secondary | ICD-10-CM | POA: Diagnosis not present

## 2020-01-08 DIAGNOSIS — G4489 Other headache syndrome: Secondary | ICD-10-CM | POA: Diagnosis not present

## 2020-01-08 DIAGNOSIS — E034 Atrophy of thyroid (acquired): Secondary | ICD-10-CM | POA: Diagnosis not present

## 2020-01-08 LAB — BASIC METABOLIC PANEL
BUN: 15 mg/dL (ref 6–23)
CO2: 33 mEq/L — ABNORMAL HIGH (ref 19–32)
Calcium: 9.7 mg/dL (ref 8.4–10.5)
Chloride: 104 mEq/L (ref 96–112)
Creatinine, Ser: 1.73 mg/dL — ABNORMAL HIGH (ref 0.40–1.50)
GFR: 38.36 mL/min — ABNORMAL LOW (ref >60.00)
Glucose, Bld: 94 mg/dL (ref 70–99)
Potassium: 4.7 mEq/L (ref 3.5–5.1)
Sodium: 140 mEq/L (ref 135–145)

## 2020-01-08 LAB — SEDIMENTATION RATE: Sed Rate: 20 mm/hr (ref 0–20)

## 2020-01-08 NOTE — Assessment & Plan Note (Signed)
BP Readings from Last 3 Encounters:  01/08/20 (!) 158/96  10/10/19 136/84  08/07/19 140/90

## 2020-01-08 NOTE — Assessment & Plan Note (Signed)
EKG due to jaw pain

## 2020-01-08 NOTE — Assessment & Plan Note (Signed)
FT4, TSH

## 2020-01-08 NOTE — Assessment & Plan Note (Signed)
?  relapse vs other

## 2020-01-08 NOTE — Assessment & Plan Note (Addendum)
L jaw ESR EKG CL stress test To ER if bad

## 2020-01-08 NOTE — Progress Notes (Signed)
Subjective:  Patient ID: Scott Oneal, male    DOB: 11/19/1940  Age: 79 y.o. MRN: [id number]  CC: No chief complaint on file.   HPI ALI MOHL presents for HTN, CAD, DM f/u C/o L temple, teeth hurt off and on w/o exertion x 2 wks . He had jaw pain many years ago when he had MI No vision loss  Outpatient Medications Prior to Visit  Medication Sig Dispense Refill  . aspirin 81 MG tablet Take 81 mg by mouth every evening.     . B-D 3CC LUER-LOK SYR 21GX1" 21G X 1" 3 ML MISC USE EVERY 14 DAYS AS DIRECTED 50 each 2  . calcitRIOL (ROCALTROL) 0.25 MCG capsule TAKE 1 CAPSULE BY MOUTH DAILY. ANNUAL APPT DUE IN OCT MUST SEE MD FOR REFILLS 90 capsule 1  . carvedilol (COREG) 25 MG tablet TAKE 1 TABLET BY MOUTH TWICE A DAY WITH MEALS 180 tablet 3  . Cholecalciferol 2000 UNITS TABS Take 2,000 Units by mouth daily.     . clonazePAM (KLONOPIN) 0.25 MG disintegrating tablet TAKE 1 TABLET (0.25 MG TOTAL) BY MOUTH 2 (TWO) TIMES DAILY AS NEEDED (ANXIETY). 60 tablet 3  . diclofenac sodium (VOLTAREN) 1 % GEL APPLY 4 G TOPICALLY 4 TIMES DAILY. 200 g 5  . levothyroxine (SYNTHROID, LEVOTHROID) 150 MCG tablet TAKE 1 TABLET BY MOUTH EVERY DAY 90 tablet 3  . Multiple Vitamin (MULTIVITAMIN) capsule Take 1 capsule by mouth daily.    . nitroGLYCERIN (NITROSTAT) 0.4 MG SL tablet Place 0.4 mg under the tongue every 5 (five) minutes as needed for chest pain.    Marland Kitchen olmesartan (BENICAR) 20 MG tablet TAKE 1 TABLET (20 MG TOTAL) BY MOUTH DAILY. 90 tablet 3  . sildenafil (VIAGRA) 100 MG tablet Take 100 mg by mouth daily as needed for erectile dysfunction.     . simvastatin (ZOCOR) 10 MG tablet TAKE 1 TABLET BY MOUTH EVERYDAY AT BEDTIME 90 tablet 3  . testosterone cypionate (DEPOTESTOSTERONE CYPIONATE) 200 MG/ML injection INJECT 2ML INTO MUSCLE EVERY 2 WEEKS 12 mL 1  . escitalopram (LEXAPRO) 5 MG tablet TAKE 1 TABLET BY MOUTH EVERYDAY AT BEDTIME (Patient not taking: Reported on 01/08/2020) 90 tablet 2   No  facility-administered medications prior to visit.    ROS: Review of Systems  Constitutional: Positive for unexpected weight change. Negative for appetite change and fatigue.  HENT: Negative for congestion, nosebleeds, sneezing, sore throat and trouble swallowing.   Eyes: Negative for itching and visual disturbance.  Respiratory: Negative for cough.   Cardiovascular: Negative for chest pain, palpitations and leg swelling.  Gastrointestinal: Negative for abdominal distention, blood in stool, diarrhea and nausea.  Genitourinary: Negative for frequency and hematuria.  Musculoskeletal: Positive for arthralgias, back pain and gait problem. Negative for joint swelling and neck pain.  Skin: Negative for rash.  Neurological: Positive for headaches. Negative for dizziness, tremors, speech difficulty and weakness.  Psychiatric/Behavioral: Negative for agitation, dysphoric mood and sleep disturbance. The patient is not nervous/anxious.     Objective:  BP (!) 158/96 (BP Location: Left Arm, Patient Position: Sitting, Cuff Size: Normal)   Pulse 64   Temp 98.3 F (36.8 C) (Oral)   Ht 6' (1.829 m)   Wt 223 lb (101.2 kg)   SpO2 96%   BMI 30.24 kg/m   BP Readings from Last 3 Encounters:  01/08/20 (!) 158/96  10/10/19 136/84  08/07/19 140/90    Wt Readings from Last 3 Encounters:  01/08/20 223 lb (101.2 kg)  10/10/19 220 lb (99.8 kg)  08/07/19 218 lb (98.9 kg)    Physical Exam Constitutional:      General: He is not in acute distress.    Appearance: He is well-developed.     Comments: NAD  Eyes:     Conjunctiva/sclera: Conjunctivae normal.     Pupils: Pupils are equal, round, and reactive to light.  Neck:     Thyroid: No thyromegaly.     Vascular: No JVD.  Cardiovascular:     Rate and Rhythm: Normal rate and regular rhythm.     Heart sounds: Normal heart sounds. No murmur. No friction rub. No gallop.   Pulmonary:     Effort: Pulmonary effort is normal. No respiratory distress.      Breath sounds: Normal breath sounds. No wheezing or rales.  Chest:     Chest wall: No tenderness.  Abdominal:     General: Bowel sounds are normal. There is no distension.     Palpations: Abdomen is soft. There is no mass.     Tenderness: There is no abdominal tenderness. There is no guarding or rebound.  Musculoskeletal:        General: No tenderness. Normal range of motion.     Cervical back: Normal range of motion.  Lymphadenopathy:     Cervical: No cervical adenopathy.  Skin:    General: Skin is warm and dry.     Findings: No rash.  Neurological:     Mental Status: He is alert and oriented to person, place, and time.     Cranial Nerves: No cranial nerve deficit.     Motor: No abnormal muscle tone.     Coordination: Coordination normal.     Gait: Gait normal.     Deep Tendon Reflexes: Reflexes are normal and symmetric.  Psychiatric:        Behavior: Behavior normal.        Thought Content: Thought content normal.        Judgment: Judgment normal.   Procedure: EKG Indication: chest pain Impression: NSR. No acute changes. No changes from 08/2019   Lab Results  Component Value Date   WBC 4.7 07/25/2019   HGB 15.8 07/25/2019   HCT 48.5 07/25/2019   PLT 69.0 (L) 07/25/2019   GLUCOSE 94 07/25/2019   CHOL 118 05/18/2017   TRIG 82.0 05/18/2017   HDL 40.60 05/18/2017   LDLCALC 61 05/18/2017   ALT 28 07/25/2019   AST 28 07/25/2019   NA 141 07/25/2019   K 4.8 07/25/2019   CL 103 07/25/2019   CREATININE 1.79 (H) 07/25/2019   BUN 17 07/25/2019   CO2 30 07/25/2019   TSH 3.93 07/25/2019   PSA 2.64 04/24/2019   INR 1.20 11/26/2014   HGBA1C 5.9 03/15/2016   MICROALBUR 1.2 05/18/2017    VAS US CAROTID  Result Date: 08/09/2019 Carotid Arterial Duplex Study Indications:  Carotid artery disease. Patient denies any recent cerebrovascular               symptoms. Risk Factors: Hypertension, hyperlipidemia, prior MI, coronary artery disease. Performing Technologist: Mariane Masters RVT  Examination Guidelines: A complete evaluation includes B-mode imaging, spectral Doppler, color Doppler, and power Doppler as needed of all accessible portions of each vessel. Bilateral testing is considered an integral part of a complete examination. Limited examinations for reoccurring indications may be performed as noted.  Right Carotid Findings: +----------+--------+--------+--------+------------------+--------+           PSV cm/sEDV cm/sStenosisPlaque DescriptionComments +----------+--------+--------+--------+------------------+--------+ CCA  Prox  87      14                                         +----------+--------+--------+--------+------------------+--------+ CCA Distal59      16                                         +----------+--------+--------+--------+------------------+--------+ ICA Prox  48      16              heterogenous               +----------+--------+--------+--------+------------------+--------+ ICA Mid   71      23      1-39%                              +----------+--------+--------+--------+------------------+--------+ ICA Distal54      21                                         +----------+--------+--------+--------+------------------+--------+ ECA       67      11                                         +----------+--------+--------+--------+------------------+--------+ +----------+--------+-------+----------------+-------------------+           PSV cm/sEDV cmsDescribe        Arm Pressure (mmHG) +----------+--------+-------+----------------+-------------------+ [id number]             Multiphasic, [id number]                 +----------+--------+-------+----------------+-------------------+ +---------+--------+--+--------+--+---------+ VertebralPSV cm/s43EDV cm/s14Antegrade +---------+--------+--+--------+--+---------+  Left Carotid Findings:  +----------+-------+--------+--------+----------------------+------------------+           PSV    EDV cm/sStenosisPlaque Description    Comments                     cm/s                                                            +----------+-------+--------+--------+----------------------+------------------+ CCA Prox  91     14                                                       +----------+-------+--------+--------+----------------------+------------------+ CCA Distal73     14                                    intimal thickening +----------+-------+--------+--------+----------------------+------------------+ ICA Prox  67     23      1-39%   diffuse and  heterogenous                             +----------+-------+--------+--------+----------------------+------------------+ ICA Mid   67     23                                                       +----------+-------+--------+--------+----------------------+------------------+ ICA Distal54     16                                                       +----------+-------+--------+--------+----------------------+------------------+ ECA       81     11                                                       +----------+-------+--------+--------+----------------------+------------------+ +----------+--------+--------+----------------+-------------------+           PSV cm/sEDV cm/sDescribe        Arm Pressure (mmHG) +----------+--------+--------+----------------+-------------------+ [id number]              Multiphasic, [id number]                 +----------+--------+--------+----------------+-------------------+ +---------+--------+--+--------+-+---------+ VertebralPSV cm/s31EDV cm/s8Antegrade +---------+--------+--+--------+-+---------+  Summary: Right Carotid: Velocities in the right ICA are consistent with a 1-39% stenosis. Left  Carotid: Velocities in the left ICA are consistent with a 1-39% stenosis. Vertebrals:  Bilateral vertebral arteries demonstrate antegrade flow. Subclavians: Normal flow hemodynamics were seen in bilateral subclavian              arteries. *See table(s) above for measurements and observations.  Electronically signed by Ena Dawley MD on 08/09/2019 at 3:39:13 PM.    Final     Assessment & Plan:    Walker Kehr, MD

## 2020-01-08 NOTE — Assessment & Plan Note (Signed)
Klonopin prn  Potential benefits of a long term benzodiazepines  use as well as potential risks  and complications were explained to the patient and were aknowledged.

## 2020-01-09 LAB — CK TOTAL AND CKMB (NOT AT ARMC)
CK, MB: 5.2 ng/mL — ABNORMAL HIGH (ref 0–5.0)
Relative Index: 2.1 (ref 0–4.0)
Total CK: 244 U/L — ABNORMAL HIGH (ref 44–196)

## 2020-01-09 NOTE — Addendum Note (Signed)
Addended by: Karren Cobble on: 01/09/2020 03:04 PM   Modules accepted: Orders

## 2020-01-10 ENCOUNTER — Other Ambulatory Visit: Payer: Self-pay | Admitting: Internal Medicine

## 2020-01-10 DIAGNOSIS — R6884 Jaw pain: Secondary | ICD-10-CM

## 2020-01-10 DIAGNOSIS — I252 Old myocardial infarction: Secondary | ICD-10-CM

## 2020-01-10 NOTE — Progress Notes (Signed)
Card ref

## 2020-01-15 ENCOUNTER — Other Ambulatory Visit: Payer: Self-pay | Admitting: Internal Medicine

## 2020-01-16 ENCOUNTER — Telehealth (HOSPITAL_COMMUNITY): Payer: Self-pay

## 2020-01-16 NOTE — Telephone Encounter (Signed)
Spoke with the patient detailed instructions were given. He stated that he would be here for his test. Asked to call back with any questions. S.Chrisanna Mishra EMTP

## 2020-01-17 ENCOUNTER — Other Ambulatory Visit (HOSPITAL_COMMUNITY)
Admission: RE | Admit: 2020-01-17 | Discharge: 2020-01-17 | Disposition: A | Discharge status: Home / Self Care | Payer: PPO | Source: Ambulatory Visit | Attending: Internal Medicine | Admitting: Internal Medicine

## 2020-01-17 DIAGNOSIS — Z01812 Encounter for preprocedural laboratory examination: Secondary | ICD-10-CM | POA: Insufficient documentation

## 2020-01-17 DIAGNOSIS — Z20822 Contact with and (suspected) exposure to covid-19: Secondary | ICD-10-CM | POA: Diagnosis not present

## 2020-01-17 LAB — SARS CORONAVIRUS 2 (TAT 6-24 HRS): SARS Coronavirus 2: NEGATIVE

## 2020-01-21 ENCOUNTER — Other Ambulatory Visit: Payer: Self-pay

## 2020-01-21 ENCOUNTER — Ambulatory Visit (HOSPITAL_COMMUNITY): Payer: PPO | Attending: Cardiology

## 2020-01-21 VITALS — Ht 72.0 in | Wt 223.0 lb

## 2020-01-21 DIAGNOSIS — I2510 Atherosclerotic heart disease of native coronary artery without angina pectoris: Secondary | ICD-10-CM

## 2020-01-21 DIAGNOSIS — R0789 Other chest pain: Secondary | ICD-10-CM | POA: Diagnosis not present

## 2020-01-21 DIAGNOSIS — R6884 Jaw pain: Secondary | ICD-10-CM | POA: Diagnosis not present

## 2020-01-21 LAB — MYOCARDIAL PERFUSION IMAGING
LV dias vol: 101 mL (ref 62–150)
LV sys vol: 54 mL
Peak HR: 88 {beats}/min
Rest HR: 61 {beats}/min
SDS: 0
SRS: 0
SSS: 0
TID: 0.84

## 2020-01-21 MED ORDER — TECHNETIUM TC 99M TETROFOSMIN IV KIT
10.1000 | PACK | Freq: Once | INTRAVENOUS | Status: AC | PRN
  Administered 2020-01-21: 10.1 via INTRAVENOUS
  Filled 2020-01-21: qty 11

## 2020-01-21 MED ORDER — REGADENOSON 0.4 MG/5ML IV SOLN
0.4000 mg | Freq: Once | INTRAVENOUS | Status: AC
  Administered 2020-01-21: 0.4 mg via INTRAVENOUS

## 2020-01-21 MED ORDER — TECHNETIUM TC 99M TETROFOSMIN IV KIT
31.3000 | PACK | Freq: Once | INTRAVENOUS | Status: AC | PRN
  Administered 2020-01-21: 31.3 via INTRAVENOUS
  Filled 2020-01-21: qty 32

## 2020-01-24 ENCOUNTER — Telehealth: Payer: Self-pay

## 2020-01-24 DIAGNOSIS — R9439 Abnormal result of other cardiovascular function study: Secondary | ICD-10-CM

## 2020-01-24 NOTE — Telephone Encounter (Signed)
-----   Message from Theodoro Parma, RN sent at 01/14/2020 11:11 AM EDT ----- Regarding: 3/23 myoview Schedule earlier with [facility] if needed

## 2020-01-24 NOTE — Telephone Encounter (Signed)
Reviewed results of stress test with Dr. Burt Knack. Reviewed results with patient. Scheduled patient for echocardiogram 3/30. Scheduled him for earlier visit with Dr. Burt Knack 4/15 (he requests to see Dr. Burt Knack instead of earlier appointment with [facility]). He was grateful for assistance.

## 2020-01-27 NOTE — Progress Notes (Signed)
Cardiology Office Note   Date:  01/28/2020   ID:  Scott Oneal, Scott Oneal September 08, 1941, MRN [id number]  PCP:  Cassandria Anger, MD  Cardiologist:  Dr. Burt Knack    Chief Complaint  Patient presents with  . Dental Pain    anginal equivalant   . Coronary Artery Disease    abnormal nuc study      History of Present Illness: Scott Oneal is a 79 y.o. male who presents for abnormal nuc study.  He has a hx of coronary artery disease. Other medical problems include chronic kidney disease, hypertension, and hyperlipidemia. 08/05/19 with Dr. Burt Knack "2005 with an inferior wall myocardial infarction. He was treated with overlapping Cypher stents in the right coronary artery. He underwent staged PCI of the left circumflex during his index hospitalization. He had been maintained on long-term dual antiplatelet therapy with aspirin and Plavix until last year when clopidogrel was discontinued at the time of a noncardiac procedure. He was noted to have diffuse nonobstructive disease of the LAD. LV function was preserved at the time of his myocardial infarction with an estimated ejection fraction of 60%."  Also carotid stenosis and doppler 10/202 was essential normal study.  Last cardiac cath 2005 with inf MI .   LAD irregular and 40% prox narrowing.  Moderate diag branch with 50% narrowing.  RCA total occluded and had PCI DES cypher stent.tandem overlying stents.   He had residual disease in LCX and 06/07/2004 had PCI  Wit cypher DES      Has seen Dr. Alain Marion and complained of [facility] and teeth pain, off and on, with exertion this reminds him of MI years ago.   Nuc study was done and EF 47% global hypokinesis, no significant perfusion defects, intermediate risk due to EF.  Echo was done today.  He had sed rate that was normal to rule out temporal arteritis.  Today he has pain intermittently.  He also has significantly elevated BP today.  He has taken his meds and BP has been climbing over last couple of  months.   When he had his MI in 2005 only had teeth pain though all teeth ached at that time.    Past Medical History:  Diagnosis Date  . Allergy   . Anxiety   . Arthritis   . Asthma   . Benign neoplasm of colon   . Calculus of gallbladder without mention of cholecystitis   . Cataract    bilateral cateracts removed  . Celiac disease   . Cholelithiasis   . Coronary atherosclerosis of unspecified type of vessel, native or graft   . Diarrhea   . Esophageal reflux   . Glaucoma   . Gout, unspecified   . Hyperparathyroidism   . Long term (current) use of anticoagulants   . Loss of weight   . Old myocardial infarction   . Osteoporosis, unspecified   . Other malaise and fatigue   . Other specified cardiac dysrhythmias(427.89)   . Other testicular hypofunction   . Personal history of colonic polyps   . Pure hypercholesterolemia   . Renal insufficiency    chronic  . Unspecified asthma(493.90)   . Unspecified essential hypertension   . Unspecified hypothyroidism     Past Surgical History:  Procedure Laterality Date  . cataract surgery Bilateral   . COLONOSCOPY    . COLONOSCOPY WITH PROPOFOL N/A 11/27/2014   Procedure: COLONOSCOPY WITH PROPOFOL;  Surgeon: Milus Banister, MD;  Location: [facility];  Service:  Endoscopy;  Laterality: N/A;  . CORONARY ANGIOPLASTY    . CORONARY STENT PLACEMENT  2011   Cypher; in distal circumflex artery  . CYSTOSCOPY  1998  . EYE SURGERY    . KNEE SURGERY     right  . SHOULDER SURGERY Left 9/14   Dr Shara Blazing  . UPPER GASTROINTESTINAL ENDOSCOPY    . VASCULAR SURGERY       Current Outpatient Medications  Medication Sig Dispense Refill  . aspirin 81 MG tablet Take 81 mg by mouth every evening.     . B-D 3CC LUER-LOK SYR 21GX1" 21G X 1" 3 ML MISC USE EVERY 14 DAYS AS DIRECTED 50 each 2  . calcitRIOL (ROCALTROL) 0.25 MCG capsule TAKE 1 CAPSULE BY MOUTH DAILY. ANNUAL APPT DUE IN OCT MUST SEE MD FOR REFILLS 90 capsule 1  . carvedilol (COREG) 25  MG tablet TAKE 1 TABLET BY MOUTH TWICE A DAY WITH MEALS 180 tablet 3  . Cholecalciferol 2000 UNITS TABS Take 2,000 Units by mouth daily.     . clonazePAM (KLONOPIN) 0.25 MG disintegrating tablet TAKE 1 TABLET (0.25 MG TOTAL) BY MOUTH 2 (TWO) TIMES DAILY AS NEEDED (ANXIETY). 60 tablet 3  . diclofenac sodium (VOLTAREN) 1 % GEL APPLY 4 G TOPICALLY 4 TIMES DAILY. 200 g 5  . levothyroxine (SYNTHROID, LEVOTHROID) 150 MCG tablet TAKE 1 TABLET BY MOUTH EVERY DAY 90 tablet 3  . Multiple Vitamin (MULTIVITAMIN) capsule Take 1 capsule by mouth daily.    . nitroGLYCERIN (NITROSTAT) 0.4 MG SL tablet Place 0.4 mg under the tongue every 5 (five) minutes as needed for chest pain.    Marland Kitchen olmesartan (BENICAR) 20 MG tablet TAKE 1 TABLET BY MOUTH EVERY DAY 90 tablet 3  . sildenafil (VIAGRA) 100 MG tablet Take 100 mg by mouth daily as needed for erectile dysfunction.     . simvastatin (ZOCOR) 10 MG tablet TAKE 1 TABLET BY MOUTH EVERYDAY AT BEDTIME 90 tablet 3  . testosterone cypionate (DEPOTESTOSTERONE CYPIONATE) 200 MG/ML injection INJECT 2ML INTO MUSCLE EVERY 2 WEEKS 12 mL 1  . hydrALAZINE (APRESOLINE) 10 MG tablet Take 1 tablet (10 mg total) by mouth 3 (three) times daily. 90 tablet 1  . isosorbide mononitrate (IMDUR) 30 MG 24 hr tablet Take 1 tablet (30 mg total) by mouth daily. 90 tablet 3   No current facility-administered medications for this visit.    Allergies:   Amlodipine, Benzalkonium chloride, Biaxin [clarithromycin], Allantoin-pramoxine, and Metoprolol tartrate    Social History:  The patient  reports that he has never smoked. He has never used smokeless tobacco. He reports that he does not drink alcohol or use drugs.   Family History:  The patient's family history includes Cancer in his father; Colon cancer in his son; Hypertension in an other family member; Lymphoma in his father; Vision loss in his maternal grandmother.    ROS:  General:no colds or fevers, no weight changes Skin:no rashes or  ulcers HEENT:no blurred vision, no congestion CV:see HPI PUL:see HPI GI:no diarrhea constipation or melena, no indigestion GU:no hematuria, no dysuria MS:no joint pain, no claudication Neuro:no syncope, no lightheadedness Endo:no diabetes, + thyroid disease  Wt Readings from Last 3 Encounters:  01/28/20 225 lb (102.1 kg)  01/21/20 223 lb (101.2 kg)  01/08/20 223 lb (101.2 kg)     PHYSICAL EXAM: VS:  BP (!) 208/104   Pulse 68   Ht 6' (1.829 m)   Wt 225 lb (102.1 kg)   SpO2 98%  BMI 30.52 kg/m  , BMI Body mass index is 30.52 kg/m. General:Pleasant affect, NAD Skin:Warm and dry, brisk capillary refill HEENT:normocephalic, sclera clear, mucus membranes moist Neck:supple, no JVD, no bruits  Heart:S1S2 RRR without murmur, gallup, rub or click Lungs:clear without rales, rhonchi, or wheezes XHF:SFSE, non tender, + BS, do not palpate liver spleen or masses Ext:no lower ext edema, 2+ pedal pulses, 2+ radial pulses Neuro:alert and oriented X 3, MAE, follows commands, + facial symmetry    EKG:  EKG is Not ordered today. The ekg 01/09/20 was reviewed and Q waves inf from inf MI in 2005 and no acute changes otherwise.    Recent Labs: 07/25/2019: ALT 28; TSH 3.93 01/28/2020: BUN 13; Creatinine, Ser 1.76; Hemoglobin 15.6; Platelets 72; Potassium 5.4; Sodium 138    Lipid Panel    Component Value Date/Time   CHOL 118 05/18/2017 0721   TRIG 82.0 05/18/2017 0721   HDL 40.60 05/18/2017 0721   CHOLHDL 3 05/18/2017 0721   VLDL 16.4 05/18/2017 0721   LDLCALC 61 05/18/2017 0721       Other studies Reviewed: Additional studies/ records that were reviewed today include: . Nuc study 01/21/20  Nuclear stress EF: 47%. Global hypokinesis  There was no ST segment deviation noted during stress.  There were no significant perfusion defects observed at rest and stress. No transient ischemic dilatation. No ischemia.  This is an intermediate risk study based upon mildly reduced ejection  fraction.   Candee Furbish, MD  Echo pending   ASSESSMENT AND PLAN:  1.  Unstable angina in that with MI he had teeth pain alone now with teeth pain and Lt temporal headache. Intermittent. Neg sed rate - reviewed Dr. Judeen Hammans notes and labs.   Discussed with Dr. Curt Bears DOD and preliminary of echo appears decreased EF - goal to control BP and do cardiac cath.   CK and MB mildly elevated but does have CKD3 so unsure how accurate.  The patient understands that risks included but are not limited to stroke (1 in 1000), death (1 in 45), kidney failure [usually temporary] (1 in 500), bleeding (1 in 200), allergic reaction [possibly serious] (1 in 200).   Will plan for Friday   2.  CAD with prior inf MI of RCA and tandem overlapping cypher stents.  Also LCX cypher stent in 2005.  Had 40 % LAD stenosis at that time.  Normal EF  3.  CKD 3a followed by Dr. Princella Pellegrini is on ARB but with elevated Cr will not increase  4.  Uncontrolled HTN this may be playing role in pain.  With decrease in EF will add hydralazine - he is on coreg 25 mg BID and ARB.  Discussed with Dr. Curt Bears increase of ARB and spironolactone but with K+ of 4.7 on recent lab and Cr of 1.73 will hold off. To have labs today. Will add imdur as well.   Will remind pt not to take viagra  5.  HLD on statin last lipids I find are from 2018 and LDL was 61 will recheck fasting.   ADDENDUM  Pre- procedure labs with plts 72 I do not see dx of thrombocytopenia but plts have been down to 69 - ongoing issue I have call into pt to see if he has seen hematologist.   His K+ is elevated will hold benicar and stop K+ foods.     Echo back with EF  45-50% G2DD   Left ventricular ejection fraction, by estimation, is 45 to 50%.  Left ventricular ejection fraction by 3D volume is 46 %. The left ventricle has mildly decreased function. The left ventricle demonstrates global hypokinesis. There is mild left ventricular hypertrophy. Left  ventricular diastolic parameters are consistent with Grade II diastolic dysfunction (pseudonormalization). Elevated left atrial pressure. The average left ventricular global longitudinal strain is -16.0 %. 2. Right ventricular systolic function is normal. The right ventricular size is normal. There is moderately elevated pulmonary artery systolic pressure. The estimated right ventricular systolic pressure is 67.6 mmHg. 3. The mitral valve is abnormal. Moderate mitral annular calcification. Trivial mitral valve regurgitation. 4. The aortic valve was not well visualized. Aortic valve regurgitation is not visualized. Mild to moderate aortic valve sclerosis/calcification is present, without any evidence of aortic stenosis. 5. Aortic dilatation noted. There is mild dilatation of the ascending aorta measuring 37 mm. 6. The inferior vena cava is normal in size with <50% respiratory variability, suggesting right atrial pressure of 8 mmHg. Left Ventricle: Left ventricular ejection fraction, by estimation, is 45 to 50%. Left ventricular ejection fraction by 3D volume is 46 %. The left ventricle has mildly decreased function. The left ventricle demonstrates global hypokinesis. The average left ventricular global longitudinal strain is -16.0 %. The left ventricular internal cavity size was normal in size. There is mild left ventricular hypertrophy. Left ventricular diastolic parameters are consistent with Grade II diastolic dysfunction (pseudonormalization). Elevated left atrial pressure. Right Ventricle: The right ventricular size is normal. No increase in right ventricular wall thickness.  * Current medicines are reviewed with the patient today.  The patient Has no concerns regarding medicines.  The following changes have been made:  See above Labs/ tests ordered today include:see above  Disposition:   FU:  see above  Signed, Cecilie Kicks, NP  01/28/2020 11:00 PM    [address], [place], [place] Phone: [phone]; Fax: [phone]

## 2020-01-28 ENCOUNTER — Ambulatory Visit (HOSPITAL_COMMUNITY): Payer: PPO | Attending: Cardiology

## 2020-01-28 ENCOUNTER — Other Ambulatory Visit: Payer: Self-pay

## 2020-01-28 ENCOUNTER — Ambulatory Visit: Payer: PPO | Admitting: Cardiology

## 2020-01-28 ENCOUNTER — Encounter: Payer: Self-pay | Admitting: Cardiology

## 2020-01-28 VITALS — BP 208/104 | HR 68 | Ht 72.0 in | Wt 225.0 lb

## 2020-01-28 DIAGNOSIS — N183 Chronic kidney disease, stage 3 unspecified: Secondary | ICD-10-CM | POA: Diagnosis not present

## 2020-01-28 DIAGNOSIS — I080 Rheumatic disorders of both mitral and aortic valves: Secondary | ICD-10-CM | POA: Diagnosis not present

## 2020-01-28 DIAGNOSIS — R9439 Abnormal result of other cardiovascular function study: Secondary | ICD-10-CM

## 2020-01-28 DIAGNOSIS — I252 Old myocardial infarction: Secondary | ICD-10-CM | POA: Insufficient documentation

## 2020-01-28 DIAGNOSIS — I1310 Hypertensive heart and chronic kidney disease without heart failure, with stage 1 through stage 4 chronic kidney disease, or unspecified chronic kidney disease: Secondary | ICD-10-CM | POA: Diagnosis not present

## 2020-01-28 DIAGNOSIS — N1831 Chronic kidney disease, stage 3a: Secondary | ICD-10-CM | POA: Diagnosis not present

## 2020-01-28 DIAGNOSIS — E782 Mixed hyperlipidemia: Secondary | ICD-10-CM

## 2020-01-28 DIAGNOSIS — I10 Essential (primary) hypertension: Secondary | ICD-10-CM | POA: Diagnosis not present

## 2020-01-28 DIAGNOSIS — I200 Unstable angina: Secondary | ICD-10-CM

## 2020-01-28 DIAGNOSIS — I2510 Atherosclerotic heart disease of native coronary artery without angina pectoris: Secondary | ICD-10-CM

## 2020-01-28 DIAGNOSIS — I77810 Thoracic aortic ectasia: Secondary | ICD-10-CM | POA: Insufficient documentation

## 2020-01-28 DIAGNOSIS — E785 Hyperlipidemia, unspecified: Secondary | ICD-10-CM | POA: Insufficient documentation

## 2020-01-28 LAB — BASIC METABOLIC PANEL
BUN/Creatinine Ratio: 7 — ABNORMAL LOW (ref 10–24)
BUN: 13 mg/dL (ref 8–27)
CO2: 34 mmol/L — ABNORMAL HIGH (ref 20–29)
Calcium: 9.7 mg/dL (ref 8.6–10.2)
Chloride: 102 mmol/L (ref 96–106)
Creatinine, Ser: 1.76 mg/dL — ABNORMAL HIGH (ref 0.76–1.27)
GFR calc Af Amer: 42 mL/min/{1.73_m2} — ABNORMAL LOW (ref >59)
GFR calc non Af Amer: 36 mL/min/{1.73_m2} — ABNORMAL LOW (ref >59)
Glucose: 98 mg/dL (ref 65–99)
Potassium: 5.4 mmol/L — ABNORMAL HIGH (ref 3.5–5.2)
Sodium: 138 mmol/L (ref 134–144)

## 2020-01-28 LAB — CBC
Hematocrit: 45.7 % (ref 37.5–51.0)
Hemoglobin: 15.6 g/dL (ref 13.0–17.7)
MCH: 30 pg (ref 26.6–33.0)
MCHC: 34.1 g/dL (ref 31.5–35.7)
MCV: 88 fL (ref 79–97)
Platelets: 72 10*3/uL — CL (ref 150–450)
RBC: 5.2 x10E6/uL (ref 4.14–5.80)
RDW: 15.6 % — ABNORMAL HIGH (ref 11.6–15.4)
WBC: 5.8 10*3/uL (ref 3.4–10.8)

## 2020-01-28 MED ORDER — HYDRALAZINE HCL 10 MG PO TABS: 10.0000 mg | ORAL_TABLET | Freq: Three times a day (TID) | ORAL | 1 refills | Status: DC

## 2020-01-28 MED ORDER — ISOSORBIDE MONONITRATE ER 30 MG PO TB24: 30.0000 mg | ORAL_TABLET | Freq: Every day | ORAL | 3 refills | Status: DC

## 2020-01-28 NOTE — Patient Instructions (Signed)
Medication Instructions:  Your physician has recommended you make the following change in your medication:  1.  START Imdur 30 mg daily 2.  START Hydralazine 10 mg taking 1 three times a day  *If you need a refill on your cardiac medications before your next appointment, please call your pharmacy*   Lab Work: TODAY:  BMET & CBC  If you have labs (blood work) drawn today and your tests are completely normal, you will receive your results only by: Marland Kitchen MyChart Message (if you have MyChart) OR . A paper copy in the mail If you have any lab test that is abnormal or we need to change your treatment, we will call you to review the results.   Testing/Procedures: Your physician has requested that you have a cardiac catheterization. Cardiac catheterization is used to diagnose and/or treat various heart conditions. Doctors may recommend this procedure for a number of different reasons. The most common reason is to evaluate chest pain. Chest pain can be a symptom of coronary artery disease (CAD), and cardiac catheterization can show whether plaque is narrowing or blocking your heart's arteries. This procedure is also used to evaluate the valves, as well as measure the blood flow and oxygen levels in different parts of your heart. For further information please visit [email or web address]. Please follow instruction sheet, BELOW:     [facility] OFFICE [place], [place] [address] Dept: [phone] Loc: [facility]  01/28/2020  You are scheduled for a Cardiac Catheterization on Friday, April 2 with Dr. Daneen Schick.  1. Please arrive at the [facility] (Main Entrance A) at [facility]: [address] [place], [place] [address] at 5:30 AM (This time is two hours before your procedure to ensure your preparation). Free valet parking service is available.   Special note: Every effort  is made to have your procedure done on time. Please understand that emergencies sometimes delay scheduled procedures.  2. Diet: Do not eat solid foods after midnight.  The patient may have clear liquids until 5am upon the day of the procedure.  3. Labs: You will need to have blood drawn on TODAY But you will need to report to the [facility] tomorrow, 01/29/2020 for COVID testing. Once you have been swabbed, you will b asked to go straight home to quarantine until your procedure on Friday.  NO OUTSIDE VISITORS.  Only people that LIVE in your household until after your procedure.   4. Medication instructions in preparation for your procedure:   Contrast Allergy: No  On the morning of your procedure, take your Aspirin and any morning medicines NOT listed above.  You may use sips of water.  5. Plan for one night stay--bring personal belongings. 6. Bring a current list of your medications and current insurance cards. 7. You MUST have a responsible person to drive you home. 8. Someone MUST be with you the first 24 hours after you arrive home or your discharge will be delayed. 9. Please wear clothes that are easy to get on and off and wear slip-on shoes.  Thank you for allowing [place] to care for you!   -- [facility]      Follow-Up: At [facility], you and your health needs are our priority.  As part of our continuing mission to provide you with exceptional heart care, we have created designated Provider Care Teams.  These  Care Teams include your primary Cardiologist (physician) and Advanced Practice Providers (APPs -  Physician Assistants and Nurse Practitioners) who all work together to provide you with the care you need, when you need it.  We recommend signing up for the patient portal called "MyChart".  Sign up information is provided on this After Visit Summary.  MyChart is used to connect with patients for Virtual Visits (Telemedicine).  Patients  are able to view lab/test results, encounter notes, upcoming appointments, etc.  Non-urgent messages can be sent to your provider as well.   To learn more about what you can do with MyChart, go to [email or web address].    Your next appointment:   2 week(s)   02/13/2020  The format for your next appointment:   In Person  Provider:   Sherren Mocha, MD   Other Instructions

## 2020-01-29 ENCOUNTER — Other Ambulatory Visit (HOSPITAL_COMMUNITY)
Admission: RE | Admit: 2020-01-29 | Discharge: 2020-01-29 | Disposition: A | Discharge status: Home / Self Care | Payer: PPO | Source: Ambulatory Visit | Attending: Interventional Cardiology | Admitting: Interventional Cardiology

## 2020-01-29 DIAGNOSIS — Z01812 Encounter for preprocedural laboratory examination: Secondary | ICD-10-CM | POA: Insufficient documentation

## 2020-01-29 DIAGNOSIS — Z20822 Contact with and (suspected) exposure to covid-19: Secondary | ICD-10-CM | POA: Insufficient documentation

## 2020-01-29 LAB — SARS CORONAVIRUS 2 (TAT 6-24 HRS): SARS Coronavirus 2: NEGATIVE

## 2020-01-30 ENCOUNTER — Telehealth: Payer: Self-pay | Admitting: *Deleted

## 2020-01-30 NOTE — Telephone Encounter (Signed)
Pt contacted pre-catheterization scheduled at [facility] for: Friday January 31, 2020 10:30 AM Verified arrival time and place: [facility]) at: 5:30 AM -pre procedure hydration  No solid food after midnight prior to cath, clear liquids until 5 AM day of procedure. Contrast allergy: no  Hold: Benicar-until post procedure  Except hold medications AM meds can be  taken pre-cath with sip of water including: ASA 81 mg   Confirmed patient has responsible adult to drive home post procedure and observe 24 hours after arriving home: yes  Currently, due to Covid-19 pandemic, only one person will be allowed with patient. Must be the same person for patient's entire stay and will be required to wear a mask. They will be asked to wait in the waiting room for the duration of the patient's stay.  Patients are required to wear a mask when they enter the hospital.     COVID-19 Pre-Screening Questions:  . In the past 7 to 10 days have you had a cough,  shortness of breath, headache, congestion, fever (100 or greater) body aches, chills, sore throat, or sudden loss of taste or sense of smell? no . Have you been around anyone with known Covid 19 in the past 7-10 days? no . Have you been around anyone who is awaiting Covid 19 test results in the past 7 to 10 days? no . Have you been around anyone who  has mentioned symptoms of Covid 19 within the past 7 to 10 days? no  I reviewed procedure/mask/visitor instructions, COVID-19 screening questions with patient,

## 2020-01-30 NOTE — Telephone Encounter (Signed)
01/28/20 platelet count 72,000 Pt states low platelet count had been mentioned to him in the past but he had never been referred to a hematologist. Pt instructed to continue ASA 81 mg  and take in the morning before going to the hospital for procedure.   I will forward to Cecilie Kicks, NP and Dr Tamala Julian for review/recommendations.

## 2020-01-31 ENCOUNTER — Ambulatory Visit (HOSPITAL_COMMUNITY)
Admission: RE | Admit: 2020-01-31 | Discharge: 2020-01-31 | Disposition: A | Discharge status: Home / Self Care | Payer: PPO | Attending: Interventional Cardiology | Admitting: Interventional Cardiology

## 2020-01-31 ENCOUNTER — Other Ambulatory Visit: Payer: Self-pay

## 2020-01-31 ENCOUNTER — Encounter (HOSPITAL_COMMUNITY)
Admission: RE | Disposition: A | Discharge status: Home / Self Care | Payer: Self-pay | Source: Home / Self Care | Attending: Interventional Cardiology

## 2020-01-31 DIAGNOSIS — Z79899 Other long term (current) drug therapy: Secondary | ICD-10-CM | POA: Insufficient documentation

## 2020-01-31 DIAGNOSIS — I252 Old myocardial infarction: Secondary | ICD-10-CM | POA: Insufficient documentation

## 2020-01-31 DIAGNOSIS — E039 Hypothyroidism, unspecified: Secondary | ICD-10-CM | POA: Insufficient documentation

## 2020-01-31 DIAGNOSIS — Z955 Presence of coronary angioplasty implant and graft: Secondary | ICD-10-CM | POA: Diagnosis not present

## 2020-01-31 DIAGNOSIS — I208 Other forms of angina pectoris: Secondary | ICD-10-CM

## 2020-01-31 DIAGNOSIS — J45909 Unspecified asthma, uncomplicated: Secondary | ICD-10-CM | POA: Diagnosis not present

## 2020-01-31 DIAGNOSIS — E785 Hyperlipidemia, unspecified: Secondary | ICD-10-CM | POA: Insufficient documentation

## 2020-01-31 DIAGNOSIS — I25118 Atherosclerotic heart disease of native coronary artery with other forms of angina pectoris: Secondary | ICD-10-CM | POA: Diagnosis not present

## 2020-01-31 DIAGNOSIS — Z7982 Long term (current) use of aspirin: Secondary | ICD-10-CM | POA: Insufficient documentation

## 2020-01-31 DIAGNOSIS — I129 Hypertensive chronic kidney disease with stage 1 through stage 4 chronic kidney disease, or unspecified chronic kidney disease: Secondary | ICD-10-CM | POA: Insufficient documentation

## 2020-01-31 DIAGNOSIS — E7800 Pure hypercholesterolemia, unspecified: Secondary | ICD-10-CM | POA: Diagnosis not present

## 2020-01-31 DIAGNOSIS — N189 Chronic kidney disease, unspecified: Secondary | ICD-10-CM | POA: Diagnosis not present

## 2020-01-31 DIAGNOSIS — K219 Gastro-esophageal reflux disease without esophagitis: Secondary | ICD-10-CM | POA: Insufficient documentation

## 2020-01-31 DIAGNOSIS — Z79890 Hormone replacement therapy (postmenopausal): Secondary | ICD-10-CM | POA: Insufficient documentation

## 2020-01-31 DIAGNOSIS — F419 Anxiety disorder, unspecified: Secondary | ICD-10-CM | POA: Insufficient documentation

## 2020-01-31 DIAGNOSIS — M109 Gout, unspecified: Secondary | ICD-10-CM | POA: Insufficient documentation

## 2020-01-31 HISTORY — PX: LEFT HEART CATH AND CORONARY ANGIOGRAPHY: CATH118249

## 2020-01-31 LAB — POCT I-STAT, CHEM 8
BUN: 14 mg/dL (ref 8–23)
Calcium, Ion: 1.06 mmol/L — ABNORMAL LOW (ref 1.15–1.40)
Chloride: 101 mmol/L (ref 98–111)
Creatinine, Ser: 1.6 mg/dL — ABNORMAL HIGH (ref 0.61–1.24)
Glucose, Bld: 95 mg/dL (ref 70–99)
HCT: 39 % (ref 39.0–52.0)
Hemoglobin: 13.3 g/dL (ref 13.0–17.0)
Potassium: 4.1 mmol/L (ref 3.5–5.1)
Sodium: 140 mmol/L (ref 135–145)
TCO2: 31 mmol/L (ref 22–32)

## 2020-01-31 LAB — BASIC METABOLIC PANEL
Anion gap: 7 (ref 5–15)
BUN: 14 mg/dL (ref 8–23)
CO2: 29 mmol/L (ref 22–32)
Calcium: 8.8 mg/dL — ABNORMAL LOW (ref 8.9–10.3)
Chloride: 105 mmol/L (ref 98–111)
Creatinine, Ser: 1.78 mg/dL — ABNORMAL HIGH (ref 0.61–1.24)
GFR calc Af Amer: 41 mL/min — ABNORMAL LOW (ref >60)
GFR calc non Af Amer: 36 mL/min — ABNORMAL LOW (ref >60)
Glucose, Bld: 96 mg/dL (ref 70–99)
Potassium: 4.5 mmol/L (ref 3.5–5.1)
Sodium: 141 mmol/L (ref 135–145)

## 2020-01-31 SURGERY — LEFT HEART CATH AND CORONARY ANGIOGRAPHY
Anesthesia: LOCAL

## 2020-01-31 MED ORDER — SODIUM CHLORIDE 0.9 % WEIGHT BASED INFUSION
3.0000 mL/kg/h | INTRAVENOUS | Status: AC
  Administered 2020-01-31: 3 mL/kg/h via INTRAVENOUS

## 2020-01-31 MED ORDER — SODIUM CHLORIDE 0.9 % IV SOLN: 250.0000 mL | INTRAVENOUS | Status: DC | PRN

## 2020-01-31 MED ORDER — ASPIRIN 81 MG PO CHEW: 81.0000 mg | CHEWABLE_TABLET | ORAL | Status: DC

## 2020-01-31 MED ORDER — SODIUM CHLORIDE 0.9% FLUSH: 3.0000 mL | INTRAVENOUS | Status: DC | PRN

## 2020-01-31 MED ORDER — ACETAMINOPHEN 325 MG PO TABS: 650.0000 mg | ORAL_TABLET | ORAL | Status: DC | PRN

## 2020-01-31 MED ORDER — SODIUM CHLORIDE 0.9% FLUSH: 3.0000 mL | Freq: Two times a day (BID) | INTRAVENOUS | Status: DC

## 2020-01-31 MED ORDER — HYDRALAZINE HCL 20 MG/ML IJ SOLN: 10.0000 mg | INTRAMUSCULAR | Status: DC | PRN

## 2020-01-31 MED ORDER — OXYCODONE HCL 5 MG PO TABS: 5.0000 mg | ORAL_TABLET | ORAL | Status: DC | PRN

## 2020-01-31 MED ORDER — MIDAZOLAM HCL 2 MG/2ML IJ SOLN
INTRAMUSCULAR | Status: DC | PRN
  Administered 2020-01-31: 1 mg via INTRAVENOUS

## 2020-01-31 MED ORDER — FENTANYL CITRATE (PF) 100 MCG/2ML IJ SOLN
INTRAMUSCULAR | Status: DC | PRN
  Administered 2020-01-31: 25 ug via INTRAVENOUS

## 2020-01-31 MED ORDER — HEPARIN SODIUM (PORCINE) 1000 UNIT/ML IJ SOLN
INTRAMUSCULAR | Status: DC | PRN
  Administered 2020-01-31: 5000 [IU] via INTRAVENOUS

## 2020-01-31 MED ORDER — ONDANSETRON HCL 4 MG/2ML IJ SOLN: 4.0000 mg | Freq: Four times a day (QID) | INTRAMUSCULAR | Status: DC | PRN

## 2020-01-31 MED ORDER — LABETALOL HCL 5 MG/ML IV SOLN: 10.0000 mg | INTRAVENOUS | Status: DC | PRN

## 2020-01-31 MED ORDER — VERAPAMIL HCL 2.5 MG/ML IV SOLN
INTRAVENOUS | Status: AC
  Filled 2020-01-31: qty 2

## 2020-01-31 MED ORDER — HEPARIN (PORCINE) IN NACL 1000-0.9 UT/500ML-% IV SOLN
INTRAVENOUS | Status: DC | PRN
  Administered 2020-01-31 (×2): 500 mL

## 2020-01-31 MED ORDER — SODIUM CHLORIDE 0.9 % WEIGHT BASED INFUSION
1.0000 mL/kg/h | INTRAVENOUS | Status: DC
  Administered 2020-01-31: 07:00:00 1 mL/kg/h via INTRAVENOUS

## 2020-01-31 MED ORDER — IOHEXOL 350 MG/ML SOLN
INTRAVENOUS | Status: DC | PRN
  Administered 2020-01-31: 70 mL via INTRA_ARTERIAL

## 2020-01-31 MED ORDER — SODIUM CHLORIDE 0.9 % IV SOLN: INTRAVENOUS | Status: DC

## 2020-01-31 MED ORDER — VERAPAMIL HCL 2.5 MG/ML IV SOLN
INTRAVENOUS | Status: DC | PRN
  Administered 2020-01-31: 10 mL via INTRA_ARTERIAL

## 2020-01-31 MED ORDER — LIDOCAINE HCL (PF) 1 % IJ SOLN
INTRAMUSCULAR | Status: AC
  Filled 2020-01-31: qty 30

## 2020-01-31 MED ORDER — FENTANYL CITRATE (PF) 100 MCG/2ML IJ SOLN
INTRAMUSCULAR | Status: AC
  Filled 2020-01-31: qty 2

## 2020-01-31 MED ORDER — HEPARIN (PORCINE) IN NACL 1000-0.9 UT/500ML-% IV SOLN
INTRAVENOUS | Status: AC
  Filled 2020-01-31: qty 1000

## 2020-01-31 MED ORDER — HEPARIN SODIUM (PORCINE) 1000 UNIT/ML IJ SOLN
INTRAMUSCULAR | Status: AC
  Filled 2020-01-31: qty 1

## 2020-01-31 MED ORDER — MIDAZOLAM HCL 2 MG/2ML IJ SOLN
INTRAMUSCULAR | Status: AC
  Filled 2020-01-31: qty 2

## 2020-01-31 MED ORDER — LIDOCAINE HCL (PF) 1 % IJ SOLN
INTRAMUSCULAR | Status: DC | PRN
  Administered 2020-01-31: 2 mL via SUBCUTANEOUS

## 2020-01-31 SURGICAL SUPPLY — 10 items
CATH 5FR JL3.5 JR4 ANG PIG MP (CATHETERS) ×1 IMPLANT
DEVICE RAD COMP TR BAND LRG (VASCULAR PRODUCTS) ×1 IMPLANT
GLIDESHEATH SLEND A-KIT 6F 22G (SHEATH) ×1 IMPLANT
GUIDEWIRE INQWIRE 1.5J.035X260 (WIRE) IMPLANT
INQWIRE 1.5J .035X260CM (WIRE) ×2
KIT HEART LEFT (KITS) ×2 IMPLANT
PACK CARDIAC CATHETERIZATION (CUSTOM PROCEDURE TRAY) ×2 IMPLANT
TRANSDUCER W/STOPCOCK (MISCELLANEOUS) ×2 IMPLANT
TUBING CIL FLEX 10 FLL-RA (TUBING) ×2 IMPLANT
WIRE HI TORQ VERSACORE-J 145CM (WIRE) ×1 IMPLANT

## 2020-01-31 NOTE — CV Procedure (Signed)
   Moderate diffuse three-vessel coronary disease.  Patent right coronary and circumflex stents with up to 50% diffuse restenosis  Distal LAD contains eccentric 70% stenosis seen in some views.  LVEDP mildly elevated at 19 mmHg.  Real-time vascular ultrasound was used for access.

## 2020-01-31 NOTE — Discharge Instructions (Signed)
Drink plenty of fluid for 48 hours and keep wrist elevated at heart level for 24 hours  Radial Site Care   This sheet gives you information about how to care for yourself after your procedure. Your health care provider may also give you more specific instructions. If you have problems or questions, contact your health care provider. What can I expect after the procedure? After the procedure, it is common to have:  Bruising and tenderness at the catheter insertion area. Follow these instructions at home: Medicines  Take over-the-counter and prescription medicines only as told by your health care provider. Insertion site care 1. Follow instructions from your health care provider about how to take care of your insertion site. Make sure you: ? Wash your hands with soap and water before you change your bandage (dressing). If soap and water are not available, use hand sanitizer. ? remove your dressing as told by your health care provider. In 24 hours 2. Check your insertion site every day for signs of infection. Check for: ? Redness, swelling, or pain. ? Fluid or blood. ? Pus or a bad smell. ? Warmth. 3. Do not take baths, swim, or use a hot tub until your health care provider approves. 4. You may shower 24-48 hours after the procedure, or as directed by your health care provider. ? Remove the dressing and gently wash the site with plain soap and water. ? Pat the area dry with a clean towel. ? Do not rub the site. That could cause bleeding. 5. Do not apply powder or lotion to the site. Activity   1. For 24 hours after the procedure, or as directed by your health care provider: ? Do not flex or bend the affected arm. ? Do not push or pull heavy objects with the affected arm. ? Do not drive yourself home from the hospital or clinic. You may drive 24 hours after the procedure unless your health care provider tells you not to. ? Do not operate machinery or power tools. 2. Do not lift  anything that is heavier than 10 lb (4.5 kg), or the limit that you are told, until your health care provider says that it is safe. For 4 days 3. Ask your health care provider when it is okay to: ? Return to work or school. ? Resume usual physical activities or sports. ? Resume sexual activity. General instructions  If the catheter site starts to bleed, raise your arm and put firm pressure on the site. If the bleeding does not stop, get help right away. This is a medical emergency.  If you went home on the same day as your procedure, a responsible adult should be with you for the first 24 hours after you arrive home.  Keep all follow-up visits as told by your health care provider. This is important. Contact a health care provider if:  You have a fever.  You have redness, swelling, or yellow drainage around your insertion site. Get help right away if:  You have unusual pain at the radial site.  The catheter insertion area swells very fast.  The insertion area is bleeding, and the bleeding does not stop when you hold steady pressure on the area.  Your arm or hand becomes pale, cool, tingly, or numb. These symptoms may represent a serious problem that is an emergency. Do not wait to see if the symptoms will go away. Get medical help right away. Call your local emergency services (911 in the [place] Do not   drive yourself to the hospital. Summary  After the procedure, it is common to have bruising and tenderness at the site.  Follow instructions from your health care provider about how to take care of your radial site wound. Check the wound every day for signs of infection.  Do not lift anything that is heavier than 10 lb (4.5 kg), or the limit that you are told, until your health care provider says that it is safe. This information is not intended to replace advice given to you by your health care provider. Make sure you discuss any questions you have with your health care  provider. Document Revised: 11/22/2017 Document Reviewed: 11/22/2017 Elsevier Patient Education  2020 [employer].  

## 2020-01-31 NOTE — Progress Notes (Signed)
Patient and wife was given discharge instructions. Both verbalized understanding. 

## 2020-02-01 NOTE — H&P (Signed)
Please refer to the pre-procedure office note by Cecilie Kicks, NP. This was reviewed and endorsed prior to the procedure without correction.       Cath Lab Visit (complete for each Cath Lab visit)  Clinical Evaluation Leading to the Procedure:   ACS: No.  Non-ACS:    Anginal Classification: CCS II  Anti-ischemic medical therapy: Minimal Therapy (1 class of medications)  Non-Invasive Test Results: Intermediate-risk stress test findings: cardiac mortality 1-3%/year  Prior CABG: No previous CABG

## 2020-02-04 ENCOUNTER — Other Ambulatory Visit: Payer: Self-pay | Admitting: Internal Medicine

## 2020-02-06 ENCOUNTER — Encounter: Payer: Self-pay | Admitting: Internal Medicine

## 2020-02-06 ENCOUNTER — Other Ambulatory Visit: Payer: Self-pay

## 2020-02-06 ENCOUNTER — Ambulatory Visit (INDEPENDENT_AMBULATORY_CARE_PROVIDER_SITE_OTHER): Payer: PPO | Admitting: Internal Medicine

## 2020-02-06 DIAGNOSIS — I2510 Atherosclerotic heart disease of native coronary artery without angina pectoris: Secondary | ICD-10-CM

## 2020-02-06 DIAGNOSIS — I5189 Other ill-defined heart diseases: Secondary | ICD-10-CM | POA: Diagnosis not present

## 2020-02-06 DIAGNOSIS — L570 Actinic keratosis: Secondary | ICD-10-CM

## 2020-02-06 DIAGNOSIS — R6884 Jaw pain: Secondary | ICD-10-CM

## 2020-02-06 NOTE — Patient Instructions (Addendum)
Use rice bag heating pad on the jaw   Postprocedure instructions :     Keep the wounds clean. You can wash them with liquid soap and water. Pat dry with gauze or a Kleenex tissue  Before applying antibiotic ointment and a Band-Aid.   You need to report immediately  if  any signs of infection develop.

## 2020-02-06 NOTE — Progress Notes (Addendum)
Subjective:  Patient ID: Scott Oneal, male    DOB: 09-27-1941  Age: 79 y.o. MRN: [id number]  CC: No chief complaint on file.   HPI Scott Oneal presents for CAD, HTN, dyslipidemia f/u S/p heart cath 01/31/20:   Widely patent left main  LAD is relatively small but does reach the left ventricular apex.  Moderate diffuse disease is noted proximally, there is eccentric 50% mid narrowing before a large septal perforator after an acute bend in the LAD before the origin of the last diagonal there is eccentric 75% stenosis in the LAD.  Distal LAD is less than 2 mm in diameter.  The circumflex system is codominant with RCA.  The circumflex gives origin to have obtuse marginal branches.  The third obtuse marginal contains ostial 60% narrowing.  The proximal circumflex beyond a 90 degree bend contains a previously placed stent that has diffuse in-stent restenosis up to 50%.  No high-grade obstruction is noted.  RCA is a codominant vessel is overlapping Cypher stent from proximal to mid.  The entire stented segment contains diffuse in-stent restenosis up to 50%.  No high-grade focal stenosis is seen.  The nonstented RCA contains moderate diffuse disease.  The EDP is 19 mmHg.  Left ventriculography was not performed because of kidney impairment.  RECOMMENDATIONS:   No focal high-grade stenosis.  Stents are patent.  Current symptoms are probably not ischemia related.  Further management as per the treating team.  C/o pain on the L side of the face - better C/o AKs   Outpatient Medications Prior to Visit  Medication Sig Dispense Refill  . Ascorbic Acid (VITAMIN C PO) Take 1 tablet by mouth every other day.    Marland Kitchen aspirin 81 MG tablet Take 81 mg by mouth every evening.     . B-D 3CC LUER-LOK SYR 21GX1" 21G X 1" 3 ML MISC USE EVERY 14 DAYS AS DIRECTED 50 each 2  . calcitRIOL (ROCALTROL) 0.25 MCG capsule TAKE 1 CAPSULE BY MOUTH DAILY. ANNUAL APPT DUE IN OCT MUST SEE MD FOR REFILLS (Patient  taking differently: Take 0.25 mcg by mouth daily. ) 90 capsule 1  . carvedilol (COREG) 25 MG tablet TAKE 1 TABLET BY MOUTH TWICE A DAY WITH MEALS 180 tablet 3  . cetirizine (ZYRTEC) 10 MG tablet Take 10 mg by mouth daily as needed for allergies.    . Cholecalciferol 2000 UNITS TABS Take 2,000 Units by mouth daily.     . clonazePAM (KLONOPIN) 0.25 MG disintegrating tablet TAKE 1 TABLET (0.25 MG TOTAL) BY MOUTH 2 (TWO) TIMES DAILY AS NEEDED (ANXIETY). (Patient taking differently: Take 0.25 mg by mouth daily as needed (anxiety). ) 60 tablet 3  . diclofenac sodium (VOLTAREN) 1 % GEL APPLY 4 G TOPICALLY 4 TIMES DAILY. (Patient taking differently: Apply 4 g topically daily as needed (Pain). ) 200 g 5  . hydrALAZINE (APRESOLINE) 10 MG tablet Take 1 tablet (10 mg total) by mouth 3 (three) times daily. 90 tablet 1  . isosorbide mononitrate (IMDUR) 30 MG 24 hr tablet Take 1 tablet (30 mg total) by mouth daily. 90 tablet 3  . levothyroxine (SYNTHROID, LEVOTHROID) 150 MCG tablet TAKE 1 TABLET BY MOUTH EVERY DAY (Patient taking differently: Take 150 mcg by mouth daily before breakfast. ) 90 tablet 3  . Multiple Vitamin (MULTIVITAMIN) capsule Take 1 capsule by mouth daily.    . Multiple Vitamins-Minerals (PRESERVISION AREDS 2) CAPS Take 1 capsule by mouth in the morning and at bedtime.    Marland Kitchen  nitroGLYCERIN (NITROSTAT) 0.4 MG SL tablet Place 0.4 mg under the tongue every 5 (five) minutes as needed for chest pain.    Marland Kitchen olmesartan (BENICAR) 20 MG tablet TAKE 1 TABLET BY MOUTH EVERY DAY (Patient taking differently: Take 20 mg by mouth daily. ) 90 tablet 3  . Propylene Glycol (SYSTANE BALANCE) 0.6 % SOLN Place 1 drop into both eyes daily as needed (Dry eye). systane balance    . sildenafil (VIAGRA) 100 MG tablet Take 100 mg by mouth daily as needed for erectile dysfunction.     . simvastatin (ZOCOR) 10 MG tablet TAKE 1 TABLET BY MOUTH EVERYDAY AT BEDTIME (Patient taking differently: Take 10 mg by mouth at bedtime. ) 90  tablet 3  . testosterone cypionate (DEPOTESTOSTERONE CYPIONATE) 200 MG/ML injection INJECT 2ML INTO MUSCLE EVERY 2 WEEKS (Patient taking differently: Inject 400 mg into the muscle every 14 (fourteen) days. ) 12 mL 1   No facility-administered medications prior to visit.    ROS: Review of Systems  Constitutional: Positive for fatigue. Negative for appetite change and unexpected weight change.  HENT: Negative for congestion, nosebleeds, sneezing, sore throat and trouble swallowing.   Eyes: Negative for itching and visual disturbance.  Respiratory: Negative for cough.   Cardiovascular: Negative for chest pain, palpitations and leg swelling.  Gastrointestinal: Negative for abdominal distention, blood in stool, diarrhea and nausea.  Genitourinary: Negative for frequency and hematuria.  Musculoskeletal: Negative for back pain, gait problem, joint swelling and neck pain.  Skin: Negative for rash.  Neurological: Positive for headaches. Negative for dizziness, tremors, speech difficulty and weakness.  Psychiatric/Behavioral: Negative for agitation, dysphoric mood and sleep disturbance. The patient is not nervous/anxious.     Objective:  There were no vitals taken for this visit.  BP Readings from Last 3 Encounters:  01/31/20 (!) 156/92  01/28/20 (!) 208/104  01/08/20 (!) 158/96    Wt Readings from Last 3 Encounters:  01/31/20 225 lb (102.1 kg)  01/28/20 225 lb (102.1 kg)  01/21/20 223 lb (101.2 kg)    Physical Exam Constitutional:      General: He is not in acute distress.    Appearance: He is well-developed.     Comments: NAD  Eyes:     Conjunctiva/sclera: Conjunctivae normal.     Pupils: Pupils are equal, round, and reactive to light.  Neck:     Thyroid: No thyromegaly.     Vascular: No JVD.  Cardiovascular:     Rate and Rhythm: Normal rate and regular rhythm.     Heart sounds: Normal heart sounds. No murmur. No friction rub. No gallop.   Pulmonary:     Effort: Pulmonary  effort is normal. No respiratory distress.     Breath sounds: Normal breath sounds. No wheezing or rales.  Chest:     Chest wall: No tenderness.  Abdominal:     General: Bowel sounds are normal. There is no distension.     Palpations: Abdomen is soft. There is no mass.     Tenderness: There is no abdominal tenderness. There is no guarding or rebound.  Musculoskeletal:        General: No tenderness. Normal range of motion.     Cervical back: Normal range of motion.  Lymphadenopathy:     Cervical: No cervical adenopathy.  Skin:    General: Skin is warm and dry.     Findings: No rash.  Neurological:     Mental Status: He is alert and oriented to person, place, and  time.     Cranial Nerves: No cranial nerve deficit.     Motor: No abnormal muscle tone.     Coordination: Coordination normal.     Gait: Gait normal.     Deep Tendon Reflexes: Reflexes are normal and symmetric.  Psychiatric:        Behavior: Behavior normal.        Thought Content: Thought content normal.        Judgment: Judgment normal.     FTF>35 min previewing and discussing cardiac test results w/pt.    Procedure Note :     Procedure : Cryosurgery   Indication:   Actinic keratosis(es) - face, scalp x3   Risks including unsuccessful procedure , bleeding, infection, bruising, scar, a need for a repeat  procedure and others were explained to the patient in detail as well as the benefits. Informed consent was obtained verbally.    3 lesion(s)  on face, scalp   was/were treated with liquid nitrogen on a Q-tip in a usual fasion . Band-Aid was applied and antibiotic ointment was given for a later use.   Tolerated well. Complications none.   Postprocedure instructions :     Keep the wounds clean. You can wash them with liquid soap and water. Pat dry with gauze or a Kleenex tissue  Before applying antibiotic ointment and a Band-Aid.   You need to report immediately  if  any signs of infection develop.     Lab  Results  Component Value Date   WBC 5.8 01/28/2020   HGB 13.3 01/31/2020   HCT 39.0 01/31/2020   PLT 72 (LL) 01/28/2020   GLUCOSE 95 01/31/2020   CHOL 118 05/18/2017   TRIG 82.0 05/18/2017   HDL 40.60 05/18/2017   LDLCALC 61 05/18/2017   ALT 28 07/25/2019   AST 28 07/25/2019   NA 140 01/31/2020   K 4.1 01/31/2020   CL 101 01/31/2020   CREATININE 1.60 (H) 01/31/2020   BUN 14 01/31/2020   CO2 29 01/31/2020   TSH 3.93 07/25/2019   PSA 2.64 04/24/2019   INR 1.20 11/26/2014   HGBA1C 5.9 03/15/2016   MICROALBUR 1.2 05/18/2017    4/21 Card ECHO IMPRESSIONS    1. Left ventricular ejection fraction, by estimation, is 45 to 50%. Left  ventricular ejection fraction by 3D volume is 46 %. The left ventricle has  mildly decreased function. The left ventricle demonstrates global  hypokinesis. There is mild left  ventricular hypertrophy. Left ventricular diastolic parameters are  consistent with Grade II diastolic dysfunction (pseudonormalization).  Elevated left atrial pressure. The average left ventricular global  longitudinal strain is -16.0 %.  2. Right ventricular systolic function is normal. The right ventricular  size is normal. There is moderately elevated pulmonary artery systolic  pressure. The estimated right ventricular systolic pressure is 17.5 mmHg.  3. The mitral valve is abnormal. Moderate mitral annular calcification.  Trivial mitral valve regurgitation.  4. The aortic valve was not well visualized. Aortic valve regurgitation  is not visualized. Mild to moderate aortic valve sclerosis/calcification  is present, without any evidence of aortic stenosis.  5. Aortic dilatation noted. There is mild dilatation of the ascending  aorta measuring 37 mm.  6. The inferior vena cava is normal in size with <50% respiratory  variability, suggesting right atrial pressure of 8 mmHg.   Assessment & Plan:   There are no diagnoses linked to this encounter.   No orders of  the defined types were placed  in this encounter.    Follow-up: No follow-ups on file.  Walker Kehr, MD

## 2020-02-06 NOTE — Assessment & Plan Note (Signed)
S/p heart cath 01/31/20:   Widely patent left main  LAD is relatively small but does reach the left ventricular apex.  Moderate diffuse disease is noted proximally, there is eccentric 50% mid narrowing before a large septal perforator after an acute bend in the LAD before the origin of the last diagonal there is eccentric 75% stenosis in the LAD.  Distal LAD is less than 2 mm in diameter.  The circumflex system is codominant with RCA.  The circumflex gives origin to have obtuse marginal branches.  The third obtuse marginal contains ostial 60% narrowing.  The proximal circumflex beyond a 90 degree bend contains a previously placed stent that has diffuse in-stent restenosis up to 50%.  No high-grade obstruction is noted.  RCA is a codominant vessel is overlapping Cypher stent from proximal to mid.  The entire stented segment contains diffuse in-stent restenosis up to 50%.  No high-grade focal stenosis is seen.  The nonstented RCA contains moderate diffuse disease.  The EDP is 19 mmHg.  Left ventriculography was not performed because of kidney impairment.  RECOMMENDATIONS:   No focal high-grade stenosis.  Stents are patent.  Current symptoms are probably not ischemia related.  Further management as per the treating team.

## 2020-02-06 NOTE — Assessment & Plan Note (Signed)
Discussed 4/21 Card ECHO IMPRESSIONS    1. Left ventricular ejection fraction, by estimation, is 45 to 50%. Left  ventricular ejection fraction by 3D volume is 46 %. The left ventricle has  mildly decreased function. The left ventricle demonstrates global  hypokinesis. There is mild left  ventricular hypertrophy. Left ventricular diastolic parameters are  consistent with Grade II diastolic dysfunction (pseudonormalization).  Elevated left atrial pressure. The average left ventricular global  longitudinal strain is -16.0 %.  2. Right ventricular systolic function is normal. The right ventricular  size is normal. There is moderately elevated pulmonary artery systolic  pressure. The estimated right ventricular systolic pressure is 82.6 mmHg.  3. The mitral valve is abnormal. Moderate mitral annular calcification.  Trivial mitral valve regurgitation.  4. The aortic valve was not well visualized. Aortic valve regurgitation  is not visualized. Mild to moderate aortic valve sclerosis/calcification  is present, without any evidence of aortic stenosis.  5. Aortic dilatation noted. There is mild dilatation of the ascending  aorta measuring 37 mm.  6. The inferior vena cava is normal in size with <50% respiratory  variability, suggesting right atrial pressure of 8 mmHg.

## 2020-02-06 NOTE — Assessment & Plan Note (Signed)
Non-cardiac. Resolving Use rice bag heating pad on the jaw

## 2020-02-06 NOTE — Assessment & Plan Note (Signed)
See Cryo

## 2020-02-07 ENCOUNTER — Other Ambulatory Visit: Payer: Self-pay | Admitting: Internal Medicine

## 2020-02-13 ENCOUNTER — Ambulatory Visit: Payer: PPO | Admitting: Cardiovascular Disease

## 2020-02-13 ENCOUNTER — Other Ambulatory Visit: Payer: Self-pay

## 2020-02-13 ENCOUNTER — Encounter: Payer: Self-pay | Admitting: Cardiovascular Disease

## 2020-02-13 VITALS — BP 144/80 | HR 73 | Ht 72.0 in | Wt 223.8 lb

## 2020-02-13 DIAGNOSIS — E782 Mixed hyperlipidemia: Secondary | ICD-10-CM | POA: Diagnosis not present

## 2020-02-13 DIAGNOSIS — I25119 Atherosclerotic heart disease of native coronary artery with unspecified angina pectoris: Secondary | ICD-10-CM

## 2020-02-13 DIAGNOSIS — I10 Essential (primary) hypertension: Secondary | ICD-10-CM

## 2020-02-13 DIAGNOSIS — I5042 Chronic combined systolic (congestive) and diastolic (congestive) heart failure: Secondary | ICD-10-CM

## 2020-02-13 MED ORDER — HYDRALAZINE HCL 25 MG PO TABS: 25.0000 mg | ORAL_TABLET | Freq: Three times a day (TID) | ORAL | 3 refills | Status: DC

## 2020-02-13 NOTE — Patient Instructions (Addendum)
Medication Instructions:  1) INCREASE HYDRALAZINE to 25 mg three times daily *If you need a refill on your cardiac medications before your next appointment, please call your pharmacy*  Lab Work: FASTING blood work on Tuesday, 4/20. You may come ANY TIME between 7:30 AM and 4:30 PM as long as you are fasting.  Follow-Up: Your provider wants you to follow-up in: 3 months with Richardson Dopp. You will receive a reminder letter in the mail two months in advance. If you don't receive a letter, please call our office to schedule the follow-up appointment.

## 2020-02-13 NOTE — Progress Notes (Signed)
Cardiology Office Note:    Date:  02/17/2020   ID:  Scott, Oneal 08/11/1941, MRN [id number]  PCP:  Scott Anger, MD  Cardiologist:  Scott Mocha, MD  Electrophysiologist:  None   Referring MD: Scott Anger, MD   Chief Complaint  Patient presents with  . Coronary Artery Disease    History of Present Illness:    Scott Oneal is a 79 y.o. male with a hx of CAD, CKD, HTN, and hyperlipidemia. He is here for follow up evaluation after recently undergoing cardiac catheterization. He has previously undergone stenting of the RCA and LCx in 2005. He developed recurrent angina and underwent stress testing demonstrating no perfusion defects, but reduced LVEF. He was also noted to have episodes of marked HTN. He was referred for cardiac cath which demonstrated moderate CAD without high grade focal stenosis.  He is here alone today. He has done a lot of reading of CHF since his hospitalization. We had lengthy discussion about his prognosis related to CAD, CHF today. He understands that he only has mild LV dysfunction and appears to have stable CAD. He complains of fatigue and exertional dyspnea. No chest pain. No symptoms with walking at a normal pace. No orthopnea, PND, or leg swelling.  Past Medical History:  Diagnosis Date  . Allergy   . Anxiety   . Arthritis   . Asthma   . Benign neoplasm of colon   . Calculus of gallbladder without mention of cholecystitis   . Cataract    bilateral cateracts removed  . Celiac disease   . Cholelithiasis   . Coronary atherosclerosis of unspecified type of vessel, native or graft   . Diarrhea   . Esophageal reflux   . Glaucoma   . Gout, unspecified   . Hyperparathyroidism   . Long term (current) use of anticoagulants   . Loss of weight   . Old myocardial infarction   . Osteoporosis, unspecified   . Other malaise and fatigue   . Other specified cardiac dysrhythmias(427.89)   . Other testicular hypofunction   . Personal  history of colonic polyps   . Pure hypercholesterolemia   . Renal insufficiency    chronic  . Unspecified asthma(493.90)   . Unspecified essential hypertension   . Unspecified hypothyroidism     Past Surgical History:  Procedure Laterality Date  . cataract surgery Bilateral   . COLONOSCOPY    . COLONOSCOPY WITH PROPOFOL N/A 11/27/2014   Procedure: COLONOSCOPY WITH PROPOFOL;  Surgeon: Scott Banister, MD;  Location: [facility];  Service: Endoscopy;  Laterality: N/A;  . CORONARY ANGIOPLASTY    . CORONARY STENT PLACEMENT  2011   Cypher; in distal circumflex artery  . CYSTOSCOPY  1998  . EYE SURGERY    . KNEE SURGERY     right  . LEFT HEART CATH AND CORONARY ANGIOGRAPHY N/A 01/31/2020   Procedure: LEFT HEART CATH AND CORONARY ANGIOGRAPHY;  Surgeon: Scott Crome, MD;  Location: [facility];  Service: Cardiovascular;  Laterality: N/A;  . SHOULDER SURGERY Left 9/14   Dr Shara Blazing  . UPPER GASTROINTESTINAL ENDOSCOPY    . VASCULAR SURGERY      Current Medications: Current Meds  Medication Sig  . Ascorbic Acid (VITAMIN C PO) Take 1 tablet by mouth every other day.  Marland Kitchen aspirin 81 MG tablet Take 81 mg by mouth every evening.   . B-D 3CC LUER-LOK SYR 21GX1" 21G X 1" 3 ML MISC USE EVERY 14  DAYS AS DIRECTED  . calcitRIOL (ROCALTROL) 0.25 MCG capsule TAKE 1 CAPSULE BY MOUTH DAILY. ANNUAL APPT DUE IN OCT MUST SEE MD FOR REFILLS  . carvedilol (COREG) 25 MG tablet TAKE 1 TABLET BY MOUTH TWICE A DAY WITH MEALS  . cetirizine (ZYRTEC) 10 MG tablet Take 10 mg by mouth daily as needed for allergies.  . Cholecalciferol 2000 UNITS TABS Take 2,000 Units by mouth daily.   . clonazePAM (KLONOPIN) 0.25 MG disintegrating tablet TAKE 1 TABLET (0.25 MG TOTAL) BY MOUTH 2 (TWO) TIMES DAILY AS NEEDED (ANXIETY).  Marland Kitchen diclofenac sodium (VOLTAREN) 1 % GEL APPLY 4 G TOPICALLY 4 TIMES DAILY.  . hydrALAZINE (APRESOLINE) 25 MG tablet Take 1 tablet (25 mg total) by mouth 3 (three) times daily.  . isosorbide  mononitrate (IMDUR) 30 MG 24 hr tablet Take 1 tablet (30 mg total) by mouth daily.  Marland Kitchen levothyroxine (SYNTHROID) 150 MCG tablet TAKE 1 TABLET BY MOUTH EVERY DAY  . Multiple Vitamin (MULTIVITAMIN) capsule Take 1 capsule by mouth daily.  . Multiple Vitamins-Minerals (PRESERVISION AREDS 2) CAPS Take 1 capsule by mouth in the morning and at bedtime.  . nitroGLYCERIN (NITROSTAT) 0.4 MG SL tablet Place 0.4 mg under the tongue every 5 (five) minutes as needed for chest pain.  Marland Kitchen olmesartan (BENICAR) 20 MG tablet TAKE 1 TABLET BY MOUTH EVERY DAY  . Propylene Glycol (SYSTANE BALANCE) 0.6 % SOLN Place 1 drop into both eyes daily as needed (Dry eye). systane balance  . sildenafil (VIAGRA) 100 MG tablet Take 100 mg by mouth daily as needed for erectile dysfunction.   . simvastatin (ZOCOR) 10 MG tablet TAKE 1 TABLET BY MOUTH EVERYDAY AT BEDTIME  . testosterone cypionate (DEPOTESTOSTERONE CYPIONATE) 200 MG/ML injection INJECT 2ML INTO MUSCLE EVERY 2 WEEKS  . [DISCONTINUED] hydrALAZINE (APRESOLINE) 10 MG tablet Take 1 tablet (10 mg total) by mouth 3 (three) times daily.     Allergies:   Amlodipine, Benzalkonium chloride, Biaxin [clarithromycin], Allantoin-pramoxine, and Metoprolol tartrate   Social History   Socioeconomic History  . Marital status: Married    Spouse name: Cortney Mckinney  . Number of children: 3  . Years of education: Not on file  . Highest education level: Not on file  Occupational History  . Occupation: retired    Fish farm manager: RETIRED    CommentParamedic  Tobacco Use  . Smoking status: Never Smoker  . Smokeless tobacco: Never Used  Substance and Sexual Activity  . Alcohol use: No  . Drug use: No  . Sexual activity: Yes  Other Topics Concern  . Not on file  Social History Narrative   Retired.   Regular Exercise-Yes; yoga & silver sneakers   Daily Caffeine Use.            Social Determinants of Health   Financial Resource Strain:   . Difficulty of Paying Living Expenses:    Food Insecurity:   . Worried About Charity fundraiser in the Last Year:   . Arboriculturist in the Last Year:   Transportation Needs:   . Film/video editor (Medical):   Marland Kitchen Lack of Transportation (Non-Medical):   Physical Activity:   . Days of Exercise per Week:   . Minutes of Exercise per Session:   Stress:   . Feeling of Stress :   Social Connections:   . Frequency of Communication with Friends and Family:   . Frequency of Social Gatherings with Friends and Family:   . Attends Religious Services:   .  Active Member of Clubs or Organizations:   . Attends Archivist Meetings:   Marland Kitchen Marital Status:      Family History: The patient's family history includes Cancer in his father; Colon cancer in his son; Hypertension in an other family member; Lymphoma in his father; Vision loss in his maternal grandmother. There is no history of Asthma, Esophageal cancer, Rectal cancer, or Stomach cancer.  ROS:   Please see the history of present illness.    All other systems reviewed and are negative.  EKGs/Labs/Other Studies Reviewed:    The following studies were reviewed today: Cardiac Cath 01/31/2020: Conclusion   Widely patent left main  LAD is relatively small but does reach the left ventricular apex.  Moderate diffuse disease is noted proximally, there is eccentric 50% mid narrowing before a large septal perforator after an acute bend in the LAD before the origin of the last diagonal there is eccentric 75% stenosis in the LAD.  Distal LAD is less than 2 mm in diameter.  The circumflex system is codominant with RCA.  The circumflex gives origin to have obtuse marginal branches.  The third obtuse marginal contains ostial 60% narrowing.  The proximal circumflex beyond a 90 degree bend contains a previously placed stent that has diffuse in-stent restenosis up to 50%.  No high-grade obstruction is noted.  RCA is a codominant vessel is overlapping Cypher stent from proximal to mid.   The entire stented segment contains diffuse in-stent restenosis up to 50%.  No high-grade focal stenosis is seen.  The nonstented RCA contains moderate diffuse disease.  The EDP is 19 mmHg.  Left ventriculography was not performed because of kidney impairment.  RECOMMENDATIONS:   No focal high-grade stenosis.  Stents are patent.  Current symptoms are probably not ischemia related.  Further management as per the treating team.    Coronary Findings  Diagnostic Dominance: Co-dominant Left Anterior Descending  Prox LAD to Mid LAD lesion 25% stenosed  Prox LAD to Mid LAD lesion is 25% stenosed.  Mid LAD lesion 65% stenosed  Mid LAD lesion is 65% stenosed.  Dist LAD lesion 75% stenosed  Dist LAD lesion is 75% stenosed.  Left Circumflex  Prox Cx to Mid Cx lesion 50% stenosed  Prox Cx to Mid Cx lesion is 50% stenosed. The lesion was previously treated.  Third Obtuse Marginal Branch  3rd Mrg lesion 30% stenosed  3rd Mrg lesion is 30% stenosed.  Right Coronary Artery  Ost RCA to Mid RCA lesion 50% stenosed  Ost RCA to Mid RCA lesion is 50% stenosed. The lesion was previously treated.  Intervention  No interventions have been documented. Left Heart  Left Ventricle LV end diastolic pressure is moderately elevated.  Coronary Diagrams  Diagnostic Dominance: Co-dominant   Echo 01/28/2020: IMPRESSIONS    1. Left ventricular ejection fraction, by estimation, is 45 to 50%. Left  ventricular ejection fraction by 3D volume is 46 %. The left ventricle has  mildly decreased function. The left ventricle demonstrates global  hypokinesis. There is mild left  ventricular hypertrophy. Left ventricular diastolic parameters are  consistent with Grade II diastolic dysfunction (pseudonormalization).  Elevated left atrial pressure. The average left ventricular global  longitudinal strain is -16.0 %.  2. Right ventricular systolic function is normal. The right ventricular  size is  normal. There is moderately elevated pulmonary artery systolic  pressure. The estimated right ventricular systolic pressure is 65.9 mmHg.  3. The mitral valve is abnormal. Moderate mitral annular calcification.  Trivial mitral valve regurgitation.  4. The aortic valve was not well visualized. Aortic valve regurgitation  is not visualized. Mild to moderate aortic valve sclerosis/calcification  is present, without any evidence of aortic stenosis.  5. Aortic dilatation noted. There is mild dilatation of the ascending  aorta measuring 37 mm.  6. The inferior vena cava is normal in size with <50% respiratory  variability, suggesting right atrial pressure of 8 mmHg.   FINDINGS  Left Ventricle: Left ventricular ejection fraction, by estimation, is 45  to 50%. Left ventricular ejection fraction by 3D volume is 46 %. The left  ventricle has mildly decreased function. The left ventricle demonstrates  global hypokinesis. The average  left ventricular global longitudinal strain is -16.0 %. The left  ventricular internal cavity size was normal in size. There is mild left  ventricular hypertrophy. Left ventricular diastolic parameters are  consistent with Grade II diastolic dysfunction  (pseudonormalization). Elevated left atrial pressure.   Right Ventricle: The right ventricular size is normal. No increase in  right ventricular wall thickness. Right ventricular systolic function is  normal. There is moderately elevated pulmonary artery systolic pressure.  The tricuspid regurgitant velocity is  2.84 m/s, and with an assumed right atrial pressure of 8 mmHg, the  estimated right ventricular systolic pressure is 09.3 mmHg.   Left Atrium: Left atrial size was normal in size.   Right Atrium: Right atrial size was normal in size.   Pericardium: Trivial pericardial effusion is present.   Mitral Valve: The mitral valve is abnormal. Moderate mitral annular  calcification. Trivial mitral valve  regurgitation.   Tricuspid Valve: The tricuspid valve is normal in structure. Tricuspid  valve regurgitation is trivial.   Aortic Valve: The aortic valve was not well visualized. Aortic valve  regurgitation is not visualized. Mild to moderate aortic valve  sclerosis/calcification is present, without any evidence of aortic  stenosis.   Pulmonic Valve: The pulmonic valve was not well visualized. Pulmonic valve  regurgitation is not visualized.   Aorta: Aortic dilatation noted. There is mild dilatation of the ascending  aorta measuring 37 mm.   Venous: The inferior vena cava is normal in size with less than 50%  respiratory variability, suggesting right atrial pressure of 8 mmHg.   IAS/Shunts: The interatrial septum was not well visualized.   Nuclear Stress Test 01-21-2020: Study Highlights    Nuclear stress EF: 47%. Global hypokinesis  There was no ST segment deviation noted during stress.  There were no significant perfusion defects observed at rest and stress. No transient ischemic dilatation. No ischemia.  This is an intermediate risk study based upon mildly reduced ejection fraction.    EKG:  EKG is ordered today.  The ekg ordered today demonstrates NSR 73 bpm, PAC, inferior infarct age undetermined  Recent Labs: 07/25/2019: ALT 28; TSH 3.93 01/28/2020: Platelets 72 01/31/2020: BUN 14; Creatinine, Ser 1.60; Hemoglobin 13.3; Potassium 4.1; Sodium 140  Recent Lipid Panel    Component Value Date/Time   CHOL 118 05/18/2017 0721   TRIG 82.0 05/18/2017 0721   HDL 40.60 05/18/2017 0721   CHOLHDL 3 05/18/2017 0721   VLDL 16.4 05/18/2017 0721   LDLCALC 61 05/18/2017 0721    Physical Exam:    VS:  BP (!) 144/80 (BP Location: Right Arm, Patient Position: Sitting, Cuff Size: Normal)   Pulse 73   Ht 6' (1.829 m)   Wt 223 lb 12.8 oz (101.5 kg)   SpO2 96%   BMI 30.35 kg/m  Wt Readings from Last 3 Encounters:  02/13/20 223 lb 12.8 oz (101.5 kg)  02/06/20 221 lb (100.2  kg)  01/31/20 225 lb (102.1 kg)     GEN:  Well nourished, well developed in no acute distress HEENT: Normal NECK: No JVD; No carotid bruits LYMPHATICS: No lymphadenopathy CARDIAC: RRR, no murmurs, rubs, gallops RESPIRATORY:  Clear to auscultation without rales, wheezing or rhonchi  ABDOMEN: Soft, non-tender, non-distended MUSCULOSKELETAL:  No edema; No deformity  SKIN: Warm and dry NEUROLOGIC:  Alert and oriented x 3 PSYCHIATRIC:  Normal affect   ASSESSMENT:    1. Chronic combined systolic and diastolic heart failure ([place])   2. Mixed hyperlipidemia   3. Coronary artery disease involving native coronary artery of native heart with angina pectoris ([place])   4. Essential hypertension    PLAN:    In order of problems listed above:  1. The patient has mild LV dysfunction with LVEF 45-50%. We discussed management strategies of chronic heart failure, and had specific discussion about medication com;iance, sodium restriction, daily exercise, and BP control. Continue olmesartan, carvedilol, hydralazine, imdur. Increase hydralazine for better BP control - home readings reviewed.  2. Continue simvastatin. Check lipids. Consider change to high intensity statin if lipids above goal. 3. Cath data reviewed. Medical therapy appropriate. Medications reviewed. Does not appear to have typical angina. 4. Hydralazine increased to 25 mg TID. Continue serial lab follow-up in setting of stable CKD 3b.   Medication Adjustments/Labs and Tests Ordered: Current medicines are reviewed at length with the patient today.  Concerns regarding medicines are outlined above.  Orders Placed This Encounter  Procedures  . Lipid panel  . Hepatic function panel  . EKG 12-Lead  . ECHOCARDIOGRAM COMPLETE   Meds ordered this encounter  Medications  . hydrALAZINE (APRESOLINE) 25 MG tablet    Sig: Take 1 tablet (25 mg total) by mouth 3 (three) times daily.    Dispense:  270 tablet    Refill:  3    Patient  Instructions  Medication Instructions:  1) INCREASE HYDRALAZINE to 25 mg three times daily *If you need a refill on your cardiac medications before your next appointment, please call your pharmacy*  Lab Work: FASTING blood work on Tuesday, 4/20. You may come ANY TIME between 7:30 AM and 4:30 PM as long as you are fasting.  Follow-Up: Your provider wants you to follow-up in: 3 months with Richardson Dopp. You will receive a reminder letter in the mail two months in advance. If you don't receive a letter, please call our office to schedule the follow-up appointment.      Signed, Scott Mocha, MD  02/17/2020 6:38 AM    [place]

## 2020-02-17 ENCOUNTER — Encounter: Payer: Self-pay | Admitting: Cardiovascular Disease

## 2020-02-18 ENCOUNTER — Other Ambulatory Visit: Payer: PPO

## 2020-02-18 ENCOUNTER — Other Ambulatory Visit: Payer: Self-pay

## 2020-02-18 DIAGNOSIS — I5042 Chronic combined systolic (congestive) and diastolic (congestive) heart failure: Secondary | ICD-10-CM | POA: Diagnosis not present

## 2020-02-18 DIAGNOSIS — E782 Mixed hyperlipidemia: Secondary | ICD-10-CM

## 2020-02-18 LAB — HEPATIC FUNCTION PANEL
ALT: 21 IU/L (ref 0–44)
AST: 28 IU/L (ref 0–40)
Albumin: 4.2 g/dL (ref 3.7–4.7)
Alkaline Phosphatase: 68 IU/L (ref 39–117)
Bilirubin Total: 0.8 mg/dL (ref 0.0–1.2)
Bilirubin, Direct: 0.25 mg/dL (ref 0.00–0.40)
Total Protein: 6.2 g/dL (ref 6.0–8.5)

## 2020-02-18 LAB — LIPID PANEL
Chol/HDL Ratio: 3.4 ratio (ref 0.0–5.0)
Cholesterol, Total: 115 mg/dL (ref 100–199)
HDL: 34 mg/dL — ABNORMAL LOW (ref >39)
LDL Chol Calc (NIH): 60 mg/dL (ref 0–99)
Triglycerides: 115 mg/dL (ref 0–149)
VLDL Cholesterol Cal: 21 mg/dL (ref 5–40)

## 2020-02-19 ENCOUNTER — Other Ambulatory Visit: Payer: Self-pay | Admitting: Cardiology

## 2020-02-27 MED ORDER — HYDRALAZINE HCL 10 MG PO TABS: 10.0000 mg | ORAL_TABLET | Freq: Three times a day (TID) | ORAL | 3 refills | Status: DC

## 2020-03-02 DIAGNOSIS — M5136 Other intervertebral disc degeneration, lumbar region: Secondary | ICD-10-CM | POA: Diagnosis not present

## 2020-03-02 DIAGNOSIS — M545 Low back pain: Secondary | ICD-10-CM | POA: Diagnosis not present

## 2020-03-02 DIAGNOSIS — I509 Heart failure, unspecified: Secondary | ICD-10-CM | POA: Insufficient documentation

## 2020-03-19 ENCOUNTER — Other Ambulatory Visit: Payer: Self-pay

## 2020-03-19 ENCOUNTER — Ambulatory Visit (INDEPENDENT_AMBULATORY_CARE_PROVIDER_SITE_OTHER): Payer: PPO | Admitting: Pharmacist

## 2020-03-19 VITALS — BP 144/78 | HR 65

## 2020-03-19 DIAGNOSIS — I10 Essential (primary) hypertension: Secondary | ICD-10-CM | POA: Diagnosis not present

## 2020-03-19 LAB — BASIC METABOLIC PANEL
BUN/Creatinine Ratio: 9 — ABNORMAL LOW (ref 10–24)
BUN: 21 mg/dL (ref 8–27)
CO2: 28 mmol/L (ref 20–29)
Calcium: 9.3 mg/dL (ref 8.6–10.2)
Chloride: 101 mmol/L (ref 96–106)
Creatinine, Ser: 2.24 mg/dL — ABNORMAL HIGH (ref 0.76–1.27)
GFR calc Af Amer: 31 mL/min/{1.73_m2} — ABNORMAL LOW (ref >59)
GFR calc non Af Amer: 27 mL/min/{1.73_m2} — ABNORMAL LOW (ref >59)
Glucose: 93 mg/dL (ref 65–99)
Potassium: 5.2 mmol/L (ref 3.5–5.2)
Sodium: 141 mmol/L (ref 134–144)

## 2020-03-19 NOTE — Progress Notes (Addendum)
Patient ID: GURKARAN RAHM                 DOB: [birth year]                      MRN: [id number]     HPI: Scott Oneal is a 79 y.o. male referred by Dr. Burt Knack to HTN clinic. PMH is significant for CAD, CKD, HTN, and hyperlipidemia. Recently had a heart cath and found to have mildly reduced EF and stable CAD. His hydralazine was increased to 59m TID in 4/15. However he sent a message on 4/29 that his blood pressures had been in the low 1211'Hsystolic with some in the 90's. He felt tired. Therefore hydralazine was decreased back to 194mTID. On 5/10 patient sent a mychart message that his blood pressures had been fluctuating and when its low he feels tired, weak and blurry vision. I recommended patient take an extra 1029mf hydralazine if systolic was >15>417d to hold hydralazine is SBP was <120.  Patient presents today to follow up in HTN clinic.  His blood pressure continues to fluctuate. He also has had some lows. This AM his BP was 75/44. He felt weak and his vision went blurry. Was able to give him wife her meds, take a nap and then he felt better. BP in office this AM was 144/78.  He has been taking his BP 2 hr after taking his medications. He has a wrist cuff but it has been validated by PCP office. 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 Has 3 boys and 8 grandchildren. Asks about what to do when blood pressure is low and goal blood pressure.  Current HTN meds: carvedilol 13m51mice a day, olmesartan 20mg12mly, hydralzine 10mg 6me times a day and isosorbide 30mg d3m Previously tried:  BP goal: <130/80  Family History: The patient's family history includes Cancer in his father; Colon cancer in his son; Hypertension in an other family member; Lymphoma in his father; Vision loss in his maternal grandmother. There is no history of Asthma, Esophageal cancer, Rectal cancer, or  Stomach cancer.  Social History: never smoked, no ETOH  Diet: cereal +fruit milk or Waffles and bacon, OJ Lunch: 16oz water, banana, PB sandwich, chips, yogurt Dinner: 16z watTeacher, musicise: walks 1-1.5 miles most days of the week. Goes to silver sneakers twice a week  Home BP readings: 136/80, 173/94, 108/64, 144/80, 144/83, 156/84, 131/69, 95/53, 126/75, 102/56  Wt Readings from Last 3 Encounters:  02/13/20 223 lb 12.8 oz (101.5 kg)  02/06/20 221 lb (100.2 kg)  01/31/20 225 lb (102.1 kg)   BP Readings from Last 3 Encounters:  02/13/20 (!) 144/80  02/06/20 (!) 164/92  01/31/20 (!) 156/92   Pulse Readings from Last 3 Encounters:  02/13/20 73  02/06/20 65  01/31/20 61    Renal function: CrCl cannot be calculated (Patient's most recent lab result is older than the maximum 21 days allowed.).  Past Medical History:  Diagnosis Date  . Allergy   . Anxiety   . Arthritis   . Asthma   . Benign neoplasm of colon   . Calculus of gallbladder without mention of cholecystitis   . Cataract    bilateral cateracts removed  . Celiac disease   . Cholelithiasis   . Coronary atherosclerosis  of unspecified type of vessel, native or graft   . Diarrhea   . Esophageal reflux   . Glaucoma   . Gout, unspecified   . Hyperparathyroidism   . Long term (current) use of anticoagulants   . Loss of weight   . Old myocardial infarction   . Osteoporosis, unspecified   . Other malaise and fatigue   . Other specified cardiac dysrhythmias(427.89)   . Other testicular hypofunction   . Personal history of colonic polyps   . Pure hypercholesterolemia   . Renal insufficiency    chronic  . Unspecified asthma(493.90)   . Unspecified essential hypertension   . Unspecified hypothyroidism     Current Outpatient Medications on File Prior to Visit  Medication Sig Dispense Refill  . Ascorbic Acid (VITAMIN C PO) Take 1 tablet by mouth every other day.    Marland Kitchen aspirin 81 MG tablet Take 81 mg by mouth  every evening.     . B-D 3CC LUER-LOK SYR 21GX1" 21G X 1" 3 ML MISC USE EVERY 14 DAYS AS DIRECTED 50 each 2  . calcitRIOL (ROCALTROL) 0.25 MCG capsule TAKE 1 CAPSULE BY MOUTH DAILY. ANNUAL APPT DUE IN OCT MUST SEE MD FOR REFILLS 90 capsule 1  . carvedilol (COREG) 25 MG tablet TAKE 1 TABLET BY MOUTH TWICE A DAY WITH MEALS 180 tablet 3  . cetirizine (ZYRTEC) 10 MG tablet Take 10 mg by mouth daily as needed for allergies.    . Cholecalciferol 2000 UNITS TABS Take 2,000 Units by mouth daily.     . clonazePAM (KLONOPIN) 0.25 MG disintegrating tablet TAKE 1 TABLET (0.25 MG TOTAL) BY MOUTH 2 (TWO) TIMES DAILY AS NEEDED (ANXIETY). 60 tablet 3  . diclofenac sodium (VOLTAREN) 1 % GEL APPLY 4 G TOPICALLY 4 TIMES DAILY. 200 g 5  . hydrALAZINE (APRESOLINE) 10 MG tablet Take 1 tablet (10 mg total) by mouth 3 (three) times daily. 270 tablet 3  . isosorbide mononitrate (IMDUR) 30 MG 24 hr tablet Take 1 tablet (30 mg total) by mouth daily. 90 tablet 3  . levothyroxine (SYNTHROID) 150 MCG tablet TAKE 1 TABLET BY MOUTH EVERY DAY 90 tablet 3  . Multiple Vitamin (MULTIVITAMIN) capsule Take 1 capsule by mouth daily.    . Multiple Vitamins-Minerals (PRESERVISION AREDS 2) CAPS Take 1 capsule by mouth in the morning and at bedtime.    . nitroGLYCERIN (NITROSTAT) 0.4 MG SL tablet Place 0.4 mg under the tongue every 5 (five) minutes as needed for chest pain.    Marland Kitchen olmesartan (BENICAR) 20 MG tablet TAKE 1 TABLET BY MOUTH EVERY DAY 90 tablet 3  . Propylene Glycol (SYSTANE BALANCE) 0.6 % SOLN Place 1 drop into both eyes daily as needed (Dry eye). systane balance    . sildenafil (VIAGRA) 100 MG tablet Take 100 mg by mouth daily as needed for erectile dysfunction.     . simvastatin (ZOCOR) 10 MG tablet TAKE 1 TABLET BY MOUTH EVERYDAY AT BEDTIME 90 tablet 3  . testosterone cypionate (DEPOTESTOSTERONE CYPIONATE) 200 MG/ML injection INJECT 2ML INTO MUSCLE EVERY 2 WEEKS 12 mL 1   No current facility-administered medications on file  prior to visit.    Allergies  Allergen Reactions  . Amlodipine     Edema   . Benzalkonium Chloride     Other reaction(s): mild rash/itching  . Biaxin [Clarithromycin]     Dizziness  . Allantoin-Pramoxine Other (See Comments)    Pt does't remember reaction  . Metoprolol Tartrate Other (See Comments)  REACTION: fatigue    There were no vitals taken for this visit.   Assessment/Plan:  1. Hypertension - Blood pressure is above goal of <130/80 today in clinic. Due to several hypotensive episodes, will have patient stop hydralazine. Will monitor blood pressure off of hydralazine for 2 weeks. Will get blood work today to see what other medications his renal function/K will allow. Hoping to be able to increase olmesartan to 41m daily. I reviewed blood pressure goal of <130/80 with patient. If we are unable to prevent his BP from dropping, may have to loosen goal. Ive reviewed some high sodium foods he is eating (bacon, chips) and have asked him to restrict them. Follow up in clinic in 2 weeks.   Thank you  MRamond Dial Pharm.D, BCPS, CPP [place] [id number] [place]  [id number] Phone: [phone] Fax: [phone]

## 2020-03-19 NOTE — Patient Instructions (Addendum)
It was a pleasure to meet you!  Please stop taking hydralazine. Continue carvedilol 73m twice a day, olmesartan 230mdaily and isosorbide 3020maily.  Continue monitoring your blood pressure  Try to limit your salt intake (limit/elimiate chips)  Call us [place] [phone] any questions

## 2020-03-23 ENCOUNTER — Telehealth: Payer: Self-pay | Admitting: Pharmacist

## 2020-03-23 ENCOUNTER — Ambulatory Visit: Payer: PPO | Admitting: Cardiovascular Disease

## 2020-03-23 DIAGNOSIS — I10 Essential (primary) hypertension: Secondary | ICD-10-CM

## 2020-03-23 MED ORDER — OLMESARTAN MEDOXOMIL 5 MG PO TABS: 5.0000 mg | ORAL_TABLET | Freq: Every day | ORAL | 11 refills | Status: DC

## 2020-03-23 NOTE — Telephone Encounter (Signed)
Spoke with Lenna Sciara and agree with her plan as outlined.  Recommend: Decrease olmesartan to 5 mg daily Use hydralazine 10 mg as needed for blood pressure greater than 180/90 Drink plenty of fluids Repeat metabolic panel in 2 to 3 weeks Keep follow-up with nephrology

## 2020-03-23 NOTE — Telephone Encounter (Signed)
Spoke with patient and advised him of the plan. He is in agreement. Will repeat labs on 6/3. Follow-up with nephrology is in Oct. Will send current labs to them. If repeat labs decline further-would advise sooner follow up.

## 2020-03-23 NOTE — Telephone Encounter (Signed)
Called patient to review labs. Scr and increased from 1.6 to 2.2. Patient denies any NSAID use. No new medications. He has been on ARB for years. He is still have great fluctuations in his blood pressure even after stopping hydralazine.  Saturday his blood pressures were 94/56, 156/81, 184/99 Sunday 100/56, 112/69, 140/91, 151/96  Yesterday he walked a total of 3.5 miles and felt great This AM he went to walk his mile and felt weak, tired and washed out.  Im concerned about that fact that he is still have low blood pressures (symptomatic) even off of hydralazine. But at the same time still experiencing pretty elevated blood pressures.   Could consider decreasing olmesartan to 67m daily and use hydralazine prn  He states he see's nephrologist this summer. I will send results to her.  Will also discuss plan with Dr. CBurt Knack

## 2020-03-23 NOTE — Addendum Note (Signed)
Addended by: Marcelle Overlie D on: 03/23/2020 07:06 AM   Modules accepted: Orders

## 2020-04-01 ENCOUNTER — Other Ambulatory Visit: Payer: Self-pay

## 2020-04-01 ENCOUNTER — Other Ambulatory Visit: Payer: PPO | Admitting: *Deleted

## 2020-04-01 DIAGNOSIS — I10 Essential (primary) hypertension: Secondary | ICD-10-CM | POA: Diagnosis not present

## 2020-04-01 LAB — BASIC METABOLIC PANEL
BUN/Creatinine Ratio: 10 (ref 10–24)
BUN: 20 mg/dL (ref 8–27)
CO2: 25 mmol/L (ref 20–29)
Calcium: 9.4 mg/dL (ref 8.6–10.2)
Chloride: 103 mmol/L (ref 96–106)
Creatinine, Ser: 2.06 mg/dL — ABNORMAL HIGH (ref 0.76–1.27)
GFR calc Af Amer: 35 mL/min/{1.73_m2} — ABNORMAL LOW (ref >59)
GFR calc non Af Amer: 30 mL/min/{1.73_m2} — ABNORMAL LOW (ref >59)
Glucose: 115 mg/dL — ABNORMAL HIGH (ref 65–99)
Potassium: 4.6 mmol/L (ref 3.5–5.2)
Sodium: 142 mmol/L (ref 134–144)

## 2020-04-02 ENCOUNTER — Ambulatory Visit: Payer: PPO

## 2020-04-02 ENCOUNTER — Telehealth: Payer: Self-pay | Admitting: Pharmacist

## 2020-04-02 ENCOUNTER — Other Ambulatory Visit: Payer: PPO

## 2020-04-02 NOTE — Telephone Encounter (Signed)
It may be the best we can do at the moment.  Please continue to monitor blood pressure.  Follow-up with nephrology.  Thanks

## 2020-04-02 NOTE — Telephone Encounter (Signed)
Called patient to discuss BMP results. Scr has improved slightly. Down to 2.06 (baseline 1.7).  Blood pressures were as follows: 5/25:115/71, 137/76, 135/90 5/26 116/60, 132/85,  5/27 121/72 5/28: 136/84 5/30: 194/85 5/31: 133/77 6/1: 136/79 6/2: 124/64, 143/99 6/3: 155/84, 142/80 No hypotensive events. Advised to patient that due to his history or hypotension his goal blood pressure would be less stringent. Would like it to be at least <150/90. Advised that current blood pressures are very good. Only to take hydralazine 16m once if BP is >180/90.   Walks 1.5-3 miles per day and silver sneakers Tuesday and Thursday  My concern is that his scr has not come down to baseline. Would probably check again in 1 week when he sees Dr. PAlain Marion If still up, will need to consider wether the cardiac benefit of ARB is worth its potentail strain on the kidneys. Patient should ideally see nephrology sooner than October, but patient states his insurance wont pay if he goes early. I will send to nephrology, Dr. CBurt Knackand Dr. PAlain Marionfor input

## 2020-04-03 NOTE — Telephone Encounter (Signed)
Agree. BP's look really good. Creatinine improved. OK to wait on nephrology follow-up as planned. thanks

## 2020-04-06 DIAGNOSIS — H353112 Nonexudative age-related macular degeneration, right eye, intermediate dry stage: Secondary | ICD-10-CM | POA: Diagnosis not present

## 2020-04-06 DIAGNOSIS — H353121 Nonexudative age-related macular degeneration, left eye, early dry stage: Secondary | ICD-10-CM | POA: Diagnosis not present

## 2020-04-07 ENCOUNTER — Encounter: Payer: Self-pay | Admitting: Internal Medicine

## 2020-04-07 ENCOUNTER — Ambulatory Visit (INDEPENDENT_AMBULATORY_CARE_PROVIDER_SITE_OTHER): Payer: PPO | Admitting: Internal Medicine

## 2020-04-07 ENCOUNTER — Other Ambulatory Visit: Payer: Self-pay

## 2020-04-07 DIAGNOSIS — N1831 Chronic kidney disease, stage 3a: Secondary | ICD-10-CM | POA: Diagnosis not present

## 2020-04-07 DIAGNOSIS — I2510 Atherosclerotic heart disease of native coronary artery without angina pectoris: Secondary | ICD-10-CM

## 2020-04-07 DIAGNOSIS — I10 Essential (primary) hypertension: Secondary | ICD-10-CM

## 2020-04-07 MED ORDER — HYDRALAZINE HCL 10 MG PO TABS: ORAL_TABLET | ORAL | 3 refills | Status: DC

## 2020-04-07 NOTE — Telephone Encounter (Signed)
Patient seen by Dr. Alain Marion today. He has had a few other lows. And then some highs. 168/92, 167/102, 174/104. Dr. Alain Marion recommended he take 48m of hydralazine if >170 and 220URif >>427systolic. Patient asked if I was ok with this as we had previously discussed SBP of 180. I advised that I though Dr. PMamie Nickrecommendation was good. I encouraged patient to decrease the frequency that he checks his BP as this can raise him anxiety and thus BP. Recommended only checking when symptomatic and every few days.

## 2020-04-07 NOTE — Assessment & Plan Note (Signed)
Hydralazine 10-20 mg 3 times a day for BP>170 as needed

## 2020-04-07 NOTE — Progress Notes (Signed)
Subjective:  Patient ID: Scott Oneal, male    DOB: 11/16/1940  Age: 79 y.o. MRN: [id number]  CC: No chief complaint on file.   HPI Scott Oneal presents for HTN - worse w/SBP in 190 range a few times - took 10 mg of Hydralazine Otherwise SBP is 108-150  Outpatient Medications Prior to Visit  Medication Sig Dispense Refill  . Ascorbic Acid (VITAMIN C PO) Take 1 tablet by mouth every other day.    Marland Kitchen aspirin 81 MG tablet Take 81 mg by mouth every evening.     . B-D 3CC LUER-LOK SYR 21GX1" 21G X 1" 3 ML MISC USE EVERY 14 DAYS AS DIRECTED 50 each 2  . calcitRIOL (ROCALTROL) 0.25 MCG capsule TAKE 1 CAPSULE BY MOUTH DAILY. ANNUAL APPT DUE IN OCT MUST SEE MD FOR REFILLS 90 capsule 1  . carvedilol (COREG) 25 MG tablet TAKE 1 TABLET BY MOUTH TWICE A DAY WITH MEALS 180 tablet 3  . cetirizine (ZYRTEC) 10 MG tablet Take 10 mg by mouth daily as needed for allergies.    . Cholecalciferol 2000 UNITS TABS Take 2,000 Units by mouth daily.     . clonazePAM (KLONOPIN) 0.25 MG disintegrating tablet TAKE 1 TABLET (0.25 MG TOTAL) BY MOUTH 2 (TWO) TIMES DAILY AS NEEDED (ANXIETY). 60 tablet 3  . diclofenac sodium (VOLTAREN) 1 % GEL APPLY 4 G TOPICALLY 4 TIMES DAILY. 200 g 5  . hydrALAZINE (APRESOLINE) 10 MG tablet Take 1 tablet if blood pressure is >180/90 30 tablet 2  . isosorbide mononitrate (IMDUR) 30 MG 24 hr tablet Take 1 tablet (30 mg total) by mouth daily. 90 tablet 3  . levothyroxine (SYNTHROID) 150 MCG tablet TAKE 1 TABLET BY MOUTH EVERY DAY 90 tablet 3  . Multiple Vitamin (MULTIVITAMIN) capsule Take 1 capsule by mouth daily.    . Multiple Vitamins-Minerals (PRESERVISION AREDS 2) CAPS Take 1 capsule by mouth in the morning and at bedtime.    . nitroGLYCERIN (NITROSTAT) 0.4 MG SL tablet Place 0.4 mg under the tongue every 5 (five) minutes as needed for chest pain.    Marland Kitchen olmesartan (BENICAR) 5 MG tablet Take 1 tablet (5 mg total) by mouth daily. 30 tablet 11  . Propylene Glycol (SYSTANE BALANCE) 0.6 %  SOLN Place 1 drop into both eyes daily as needed (Dry eye). systane balance    . sildenafil (VIAGRA) 100 MG tablet Take 100 mg by mouth daily as needed for erectile dysfunction.     . simvastatin (ZOCOR) 10 MG tablet TAKE 1 TABLET BY MOUTH EVERYDAY AT BEDTIME 90 tablet 3  . testosterone cypionate (DEPOTESTOSTERONE CYPIONATE) 200 MG/ML injection INJECT 2ML INTO MUSCLE EVERY 2 WEEKS 12 mL 1   No facility-administered medications prior to visit.    ROS: Review of Systems  Constitutional: Positive for fatigue. Negative for appetite change and unexpected weight change.  HENT: Negative for congestion, nosebleeds, sneezing, sore throat and trouble swallowing.   Eyes: Negative for itching and visual disturbance.  Respiratory: Negative for cough.   Cardiovascular: Negative for chest pain, palpitations and leg swelling.  Gastrointestinal: Negative for abdominal distention, blood in stool, diarrhea and nausea.  Genitourinary: Negative for frequency and hematuria.  Musculoskeletal: Positive for arthralgias and back pain. Negative for gait problem, joint swelling and neck pain.  Skin: Negative for rash.  Neurological: Negative for dizziness, tremors, speech difficulty and weakness.  Psychiatric/Behavioral: Negative for agitation, dysphoric mood, sleep disturbance and suicidal ideas. The patient is not nervous/anxious.  Objective:  BP (!) 190/92 (BP Location: Left Arm, Patient Position: Sitting, Cuff Size: Normal)   Pulse 64   Temp 98.2 F (36.8 C) (Oral)   Ht 6' (1.829 m)   Wt 211 lb 2 oz (95.8 kg)   SpO2 97%   BMI 28.63 kg/m   BP Readings from Last 3 Encounters:  04/07/20 (!) 190/92  03/19/20 (!) 144/78  02/13/20 (!) 144/80    Wt Readings from Last 3 Encounters:  04/07/20 211 lb 2 oz (95.8 kg)  02/13/20 223 lb 12.8 oz (101.5 kg)  02/06/20 221 lb (100.2 kg)    Physical Exam Constitutional:      General: He is not in acute distress.    Appearance: He is well-developed. He is  obese.     Comments: NAD  Eyes:     Conjunctiva/sclera: Conjunctivae normal.     Pupils: Pupils are equal, round, and reactive to light.  Neck:     Thyroid: No thyromegaly.     Vascular: No JVD.  Cardiovascular:     Rate and Rhythm: Normal rate and regular rhythm.     Heart sounds: Normal heart sounds. No murmur. No friction rub. No gallop.   Pulmonary:     Effort: Pulmonary effort is normal. No respiratory distress.     Breath sounds: Normal breath sounds. No wheezing or rales.  Chest:     Chest wall: No tenderness.  Abdominal:     General: Bowel sounds are normal. There is no distension.     Palpations: Abdomen is soft. There is no mass.     Tenderness: There is no abdominal tenderness. There is no guarding or rebound.  Musculoskeletal:        General: No tenderness. Normal range of motion.     Cervical back: Normal range of motion.  Lymphadenopathy:     Cervical: No cervical adenopathy.  Skin:    General: Skin is warm and dry.     Findings: No rash.  Neurological:     Mental Status: He is alert and oriented to person, place, and time.     Cranial Nerves: No cranial nerve deficit.     Motor: No abnormal muscle tone.     Coordination: Coordination normal.     Gait: Gait normal.     Deep Tendon Reflexes: Reflexes are normal and symmetric.  Psychiatric:        Behavior: Behavior normal.        Thought Content: Thought content normal.        Judgment: Judgment normal.     Lab Results  Component Value Date   WBC 5.8 01/28/2020   HGB 13.3 01/31/2020   HCT 39.0 01/31/2020   PLT 72 (LL) 01/28/2020   GLUCOSE 115 (H) 04/01/2020   CHOL 115 02/18/2020   TRIG 115 02/18/2020   HDL 34 (L) 02/18/2020   LDLCALC 60 02/18/2020   ALT 21 02/18/2020   AST 28 02/18/2020   NA 142 04/01/2020   K 4.6 04/01/2020   CL 103 04/01/2020   CREATININE 2.06 (H) 04/01/2020   BUN 20 04/01/2020   CO2 25 04/01/2020   TSH 3.93 07/25/2019   PSA 2.64 04/24/2019   INR 1.20 11/26/2014   HGBA1C  5.9 03/15/2016   MICROALBUR 1.2 05/18/2017    No results found.  Assessment & Plan:    Walker Kehr, MD

## 2020-04-07 NOTE — Assessment & Plan Note (Addendum)
Worse - labile SBP 84-190 F/u w/Dr Kandis Nab reduced to 5 mg/d 5/21 Cont Hydralazine 10-20 tid prn

## 2020-04-07 NOTE — Patient Instructions (Signed)
Hydralazine 10-20 mg 3 times a day for BP>170 as needed

## 2020-04-07 NOTE — Assessment & Plan Note (Signed)
No angina 

## 2020-04-08 ENCOUNTER — Encounter: Payer: Self-pay | Admitting: Gastroenterology

## 2020-04-08 DIAGNOSIS — N5201 Erectile dysfunction due to arterial insufficiency: Secondary | ICD-10-CM | POA: Diagnosis not present

## 2020-04-08 DIAGNOSIS — N401 Enlarged prostate with lower urinary tract symptoms: Secondary | ICD-10-CM | POA: Diagnosis not present

## 2020-04-08 DIAGNOSIS — R311 Benign essential microscopic hematuria: Secondary | ICD-10-CM | POA: Diagnosis not present

## 2020-04-16 ENCOUNTER — Ambulatory Visit: Payer: PPO

## 2020-04-20 ENCOUNTER — Telehealth: Payer: Self-pay | Admitting: Pharmacist

## 2020-04-20 ENCOUNTER — Ambulatory Visit (INDEPENDENT_AMBULATORY_CARE_PROVIDER_SITE_OTHER): Payer: PPO

## 2020-04-20 ENCOUNTER — Other Ambulatory Visit: Payer: Self-pay

## 2020-04-20 VITALS — BP 124/70 | HR 71 | Temp 98.2°F | Resp 16 | Ht 72.0 in | Wt 214.2 lb

## 2020-04-20 DIAGNOSIS — Z0000 Encounter for general adult medical examination without abnormal findings: Secondary | ICD-10-CM | POA: Diagnosis not present

## 2020-04-20 NOTE — Progress Notes (Signed)
Subjective:   Scott Oneal is a 79 y.o. male who presents for Medicare Annual/Subsequent preventive examination.  Review of Systems    NO ROS. Medicare Wellness Visit Cardiac Risk Factors include: advanced age (>33mn, >>63women);dyslipidemia;hypertension;male gender     Objective:    Today's Vitals   04/20/20 1430  BP: 124/70  Pulse: 71  Resp: 16  Temp: 98.2 F (36.8 C)  SpO2: 97%  Weight: 214 lb 3.2 oz (97.2 kg)  Height: 6' (1.829 m)  PainSc: 0-No pain   Body mass index is 29.05 kg/m.  Advanced Directives 04/20/2020 01/31/2020 04/17/2019 01/08/2019 03/06/2018 06/06/2017 02/21/2017  Does Patient Have a Medical Advance Directive? Yes Yes Yes Yes Yes Yes Yes  Type of Advance Directive - [place] Living will;Healthcare Power of [place]  Does patient want to make changes to medical advance directive? No - Patient declined No - Patient declined - No - Patient declined - - -  Copy of HMirandain Chart? - - No - copy requested No - copy requested No - copy requested - No - copy requested    Current Medications (verified) Outpatient Encounter Medications as of 04/20/2020  Medication Sig  . Ascorbic Acid (VITAMIN C PO) Take 1 tablet by mouth every other day.  .Marland Kitchenaspirin 81 MG tablet Take 81 mg by mouth every evening.   . B-D 3CC LUER-LOK SYR 21GX1" 21G X 1" 3 ML MISC USE EVERY 14 DAYS AS DIRECTED  . calcitRIOL (ROCALTROL) 0.25 MCG capsule TAKE 1 CAPSULE BY MOUTH DAILY. ANNUAL APPT DUE IN OCT MUST SEE MD FOR REFILLS  . carvedilol (COREG) 25 MG tablet TAKE 1 TABLET BY MOUTH TWICE A DAY WITH MEALS  . cetirizine (ZYRTEC) 10 MG tablet Take 10 mg by mouth daily as needed for allergies.  . Cholecalciferol 2000 UNITS TABS Take 2,000 Units by mouth daily.   . clonazePAM (KLONOPIN) 0.25 MG disintegrating tablet  TAKE 1 TABLET (0.25 MG TOTAL) BY MOUTH 2 (TWO) TIMES DAILY AS NEEDED (ANXIETY).  .Marland Kitchendiclofenac sodium (VOLTAREN) 1 % GEL APPLY 4 G TOPICALLY 4 TIMES DAILY.  . hydrALAZINE (APRESOLINE) 10 MG tablet Take 1-2 tablet 3 times a day as needed if blood pressure is >170  . isosorbide mononitrate (IMDUR) 30 MG 24 hr tablet Take 1 tablet (30 mg total) by mouth daily.  .Marland Kitchenlevothyroxine (SYNTHROID) 150 MCG tablet TAKE 1 TABLET BY MOUTH EVERY DAY  . Multiple Vitamin (MULTIVITAMIN) capsule Take 1 capsule by mouth daily.  . Multiple Vitamins-Minerals (PRESERVISION AREDS 2) CAPS Take 1 capsule by mouth in the morning and at bedtime.  . nitroGLYCERIN (NITROSTAT) 0.4 MG SL tablet Place 0.4 mg under the tongue every 5 (five) minutes as needed for chest pain.  .Marland Kitchenolmesartan (BENICAR) 5 MG tablet Take 1 tablet (5 mg total) by mouth daily.  .Marland KitchenPropylene Glycol (SYSTANE BALANCE) 0.6 % SOLN Place 1 drop into both eyes daily as needed (Dry eye). systane balance  . sildenafil (VIAGRA) 100 MG tablet Take 100 mg by mouth daily as needed for erectile dysfunction.   . simvastatin (ZOCOR) 10 MG tablet TAKE 1 TABLET BY MOUTH EVERYDAY AT BEDTIME  . testosterone cypionate (DEPOTESTOSTERONE CYPIONATE) 200 MG/ML injection INJECT 2ML INTO MUSCLE EVERY 2 WEEKS   No facility-administered encounter medications on file as of 04/20/2020.    Allergies (verified) Amlodipine, Benzalkonium chloride, Biaxin [  clarithromycin], Allantoin-pramoxine, and Metoprolol tartrate   History: Past Medical History:  Diagnosis Date  . Allergy   . Anxiety   . Arthritis   . Asthma   . Benign neoplasm of colon   . Calculus of gallbladder without mention of cholecystitis   . Cataract    bilateral cateracts removed  . Celiac disease   . Cholelithiasis   . Coronary atherosclerosis of unspecified type of vessel, native or graft   . Diarrhea   . Esophageal reflux   . Glaucoma   . Gout, unspecified   . Hyperparathyroidism   . Long term (current) use of  anticoagulants   . Loss of weight   . Old myocardial infarction   . Osteoporosis, unspecified   . Other malaise and fatigue   . Other specified cardiac dysrhythmias(427.89)   . Other testicular hypofunction   . Personal history of colonic polyps   . Pure hypercholesterolemia   . Renal insufficiency    chronic  . Unspecified asthma(493.90)   . Unspecified essential hypertension   . Unspecified hypothyroidism    Past Surgical History:  Procedure Laterality Date  . cataract surgery Bilateral   . COLONOSCOPY    . COLONOSCOPY WITH PROPOFOL N/A 11/27/2014   Procedure: COLONOSCOPY WITH PROPOFOL;  Surgeon: Milus Banister, MD;  Location: [facility];  Service: Endoscopy;  Laterality: N/A;  . CORONARY ANGIOPLASTY    . CORONARY STENT PLACEMENT  2011   Cypher; in distal circumflex artery  . CYSTOSCOPY  1998  . EYE SURGERY    . KNEE SURGERY     right  . LEFT HEART CATH AND CORONARY ANGIOGRAPHY N/A 01/31/2020   Procedure: LEFT HEART CATH AND CORONARY ANGIOGRAPHY;  Surgeon: Belva Crome, MD;  Location: [facility];  Service: Cardiovascular;  Laterality: N/A;  . SHOULDER SURGERY Left 9/14   Dr Shara Blazing  . UPPER GASTROINTESTINAL ENDOSCOPY    . VASCULAR SURGERY     Family History  Problem Relation Age of Onset  . Lymphoma Father   . Hypertension Other   . Vision loss Maternal Grandmother   . Colon cancer Son   . Asthma Neg Hx   . Esophageal cancer Neg Hx   . Rectal cancer Neg Hx   . Stomach cancer Neg Hx    Social History   Socioeconomic History  . Marital status: Married    Spouse name: Arther Heisler  . Number of children: 3  . Years of education: Not on file  . Highest education level: Not on file  Occupational History  . Occupation: retired    Fish farm manager: RETIRED    CommentParamedic  Tobacco Use  . Smoking status: Never Smoker  . Smokeless tobacco: Never Used  Vaping Use  . Vaping Use: Never used  Substance and Sexual Activity  . Alcohol use: No  . Drug use:  No  . Sexual activity: Yes  Other Topics Concern  . Not on file  Social History Narrative   Retired.   Regular Exercise-Yes; yoga & silver sneakers   Daily Caffeine Use.            Social Determinants of Health   Financial Resource Strain:   . Difficulty of Paying Living Expenses:   Food Insecurity:   . Worried About Charity fundraiser in the Last Year:   . Arboriculturist in the Last Year:   Transportation Needs:   . Film/video editor (Medical):   Marland Kitchen Lack of Transportation (Non-Medical):  Physical Activity:   . Days of Exercise per Week:   . Minutes of Exercise per Session:   Stress:   . Feeling of Stress :   Social Connections:   . Frequency of Communication with Friends and Family:   . Frequency of Social Gatherings with Friends and Family:   . Attends Religious Services:   . Active Member of Clubs or Organizations:   . Attends Archivist Meetings:   Marland Kitchen Marital Status:     Tobacco Counseling Counseling given: No   Clinical Intake:  Pre-visit preparation completed: Yes  Pain : No/denies pain Pain Score: 0-No pain     BMI - recorded: 29.05 Nutritional Status: BMI 25 -29 Overweight Nutritional Risks: None Diabetes: No  How often do you need to have someone help you when you read instructions, pamphlets, or other written materials from your doctor or pharmacy?: 1 - Never What is the last grade level you completed in school?: Master's Degree from [employer]; Engineering degree from [employer] [place]  Diabetic? No  Interpreter Needed?: No  Information entered by :: Gianlucca Szymborski N. Chaeli Judy, LPN   Activities of Daily Living In your present state of health, do you have any difficulty performing the following activities: 04/20/2020  Hearing? Y  Comment wears hearing aids  Vision? N  Difficulty concentrating or making decisions? N  Walking or climbing stairs? N  Dressing or bathing? N  Doing errands, shopping? N  Preparing Food and eating ? N    Using the Toilet? N  In the past six months, have you accidently leaked urine? N  Do you have problems with loss of bowel control? N  Managing your Medications? N  Managing your Finances? N  Housekeeping or managing your Housekeeping? N  Some recent data might be hidden    Patient Care Team: Plotnikov, Evie Lacks, MD as PCP - Cyndia Diver, MD as PCP - Cardiology (Cardiology) Elsie Stain, MD as Consulting Physician (Pulmonary Disease) Ladene Artist, MD as Consulting Physician (Gastroenterology) Franchot Gallo, MD as Consulting Physician (Urology) Corliss Parish, MD as Consulting Physician (Nephrology) Sherren Mocha, MD as Consulting Physician (Cardiology) Loleta Books, MD as Consulting Physician (Ophthalmology)  Indicate any recent Medical Services you may have received from other than [facility] providers in the past year (date may be approximate).     Assessment:   This is a routine wellness examination for [place].  Hearing/Vision screen No exam data present  Dietary issues and exercise activities discussed: Current Exercise Habits: Structured exercise class, Type of exercise: yoga;walking (walks 1-3 miles per day and also goes to [employer] at the gym), Time (Minutes): 30, Frequency (Times/Week): 7, Weekly Exercise (Minutes/Week): 210, Intensity: Moderate, Exercise limited by: None identified  Goals    . Maintain current health status     Continue to eat healthy and exercise weekly.    . Patient Stated     Stay as healthy and as independent as possible. Enjoy life, family, continue to go on our annual beach trip with my family.    . Patient Stated     Working on losing more weight and staying active.      Depression Screen PHQ 2/9 Scores 04/20/2020 04/17/2019 03/06/2018 10/23/2017 10/23/2017 02/21/2017 02/29/2016  PHQ - 2 Score 0 0 1 0 0 0 0  PHQ- 9 Score - - 1 - - 2 -    Fall Risk Fall Risk  04/20/2020 04/17/2019 03/06/2018 10/23/2017  10/23/2017  Falls in the past year? 0  0 Yes No No  Number falls in past yr: 0 0 1 - -  Injury with Fall? 0 - - - -  Risk for fall due to : No Fall Risks Impaired balance/gait - - -  Follow up Falls evaluation completed;Education provided Falls prevention discussed Falls prevention discussed - -    Any stairs in or around the home? Yes  If so, are there any without handrails? No  Home free of loose throw rugs in walkways, pet beds, electrical cords, etc? Yes  Adequate lighting in your home to reduce risk of falls? Yes   ASSISTIVE DEVICES UTILIZED TO PREVENT FALLS:  Life alert? No  Use of a cane, walker or w/c? No  Grab bars in the bathroom? Yes  Shower chair or bench in shower? No  Elevated toilet seat or a handicapped toilet? No   TIMED UP AND GO:  Was the test performed? No .  Length of time to ambulate 10 feet: 0 sec.   Gait steady and fast without use of assistive device  Cognitive Function: MMSE - Mini Mental State Exam 03/06/2018  Orientation to time 5  Orientation to Place 5  Registration 3  Attention/ Calculation 5  Recall 2  Language- name 2 objects 2  Language- repeat 1  Language- follow 3 step command 3  Language- read & follow direction 1  Write a sentence 1  Copy design 1  Total score 29     6CIT Screen 04/20/2020  What Year? 0 points  What month? 0 points  What time? 0 points  Count back from 20 0 points  Months in reverse 0 points  Repeat phrase 0 points  Total Score 0    Immunizations Immunization History  Administered Date(s) Administered  . Fluad Quad(high Dose 65+) 07/18/2019  . Influenza Split 07/27/2011, 08/07/2012  . Influenza Whole 10/04/2002, 07/30/2008, 08/12/2009, 07/07/2010  . Influenza, High Dose Seasonal PF 08/07/2015, 08/01/2016, 07/20/2017, 06/29/2018  . Influenza,inj,Quad PF,6+ Mos 07/17/2013, 07/21/2014  . Influenza-Unspecified 06/29/2018  . Pneumococcal Conjugate-13 09/30/2013  . Pneumococcal Polysaccharide-23 05/15/2006,  12/12/2012  . Td 11/24/2010  . Zoster 07/11/2006  . Zoster Recombinat (Shingrix) 03/02/2017, 06/08/2017    TDAP status: Up to date Flu Vaccine status: Up to date Pneumococcal vaccine status: Up to date Covid-19 vaccine status: Completed vaccines  Qualifies for Shingles Vaccine? Yes   Zostavax completed Yes   Shingrix Completed?: Yes  Screening Tests Health Maintenance  Topic Date Due  . Hepatitis C Screening  Never done  . COVID-19 Vaccine (1) Never done  . INFLUENZA VACCINE  05/31/2020  . COLONOSCOPY  06/26/2020  . TETANUS/TDAP  11/24/2020  . PNA vac Low Risk Adult  Completed    Health Maintenance  Health Maintenance Due  Topic Date Due  . Hepatitis C Screening  Never done  . COVID-19 Vaccine (1) Never done    Colorectal cancer screening: Completed 06/26/2017. Repeat every 3 years  Lung Cancer Screening: (Low Dose CT Chest recommended if Age 58-80 years, 30 pack-year currently smoking OR have quit w/in 15years.) does not qualify.   Lung Cancer Screening Referral: No  Additional Screening:  Hepatitis C Screening: does not qualify; Completed: never done  Vision Screening: Recommended annual ophthalmology exams for early detection of glaucoma and other disorders of the eye. Is the patient up to date with their annual eye exam?  Yes  Who is the provider or what is the name of the office in which the patient attends annual eye exams? Julian Reil,  MD If pt is not established with a provider, would they like to be referred to a provider to establish care? No .   Dental Screening: Recommended annual dental exams for proper oral hygiene  Community Resource Referral / Chronic Care Management: CRR required this visit?  No   CCM required this visit?  No      Plan:     Reviewed health maintenance screenings with patient today and relevant education, vaccines, and/or referrals were provided.    Continue doing brain stimulating activities (puzzles, reading, adult  coloring books, staying active) to keep memory sharp.    Continue to eat heart healthy diet (full of fruits, vegetables, whole grains, lean protein, water--limit salt, fat, and sugar intake) and increase physical activity as tolerated.  I have personally reviewed and noted the following in the patient's chart:   . Medical and social history . Use of alcohol, tobacco or illicit drugs  . Current medications and supplements . Functional ability and status . Nutritional status . Physical activity . Advanced directives . List of other physicians . Hospitalizations, surgeries, and ER visits in previous 12 months . Vitals . Screenings to include cognitive, depression, and falls . Referrals and appointments  In addition, I have reviewed and discussed with patient certain preventive protocols, quality metrics, and best practice recommendations. A written personalized care plan for preventive services as well as general preventive health recommendations were provided to patient.     Sheral Flow, LPN   [id number]   Nurse Notes:  Patient is cogitatively intact. Patient participates in yoga, Silver Sneakers program and walking 1-3 miles per day for exercise. Patient goes to dentist every 6 months for cleaning.

## 2020-04-20 NOTE — Patient Instructions (Signed)
[name] , Thank you for taking time to come for your Medicare Wellness Visit. I appreciate your ongoing commitment to your health goals. Please review the following plan we discussed and let me know if I can assist you in the future.   Screening recommendations/referrals: Colonoscopy: last done 06/26/2017; due every 3 years (05/2020) Recommended yearly ophthalmology/optometry visit for glaucoma screening and checkup Recommended yearly dental visit for hygiene and checkup  Vaccinations: Influenza vaccine: 07/18/2019 Pneumococcal vaccine: completed Tdap vaccine: 11/24/2010; due every 10 years; due 11/24/2020 Shingles vaccine: completed   Covid-19: completed  Advanced directives: Please bring a copy of your health care power of attorney and living will to the office at your convenience.  Conditions/risks identified: Please continue to do your personal lifestyle choices by: daily care of teeth and gums, regular physical activity (goal should be 5 days a week for 30 minutes), eat a healthy diet, avoid tobacco and drug use, limiting any alcohol intake, taking a low-dose aspirin (if not allergic or have been advised by your provider otherwise) and taking vitamins and minerals as recommended by your provider. Continue doing brain stimulating activities (puzzles, reading, adult coloring books, staying active) to keep memory sharp. Continue to eat heart healthy diet (full of fruits, vegetables, whole grains, lean protein, water--limit salt, fat, and sugar intake) and increase physical activity as tolerated.   Next appointment: Please schedule your next Medicare Wellness Visit with your Nurse Health Advisor in 1 year.  Preventive Care 41 Years and Older, Male Preventive care refers to lifestyle choices and visits with your health care provider that can promote health and wellness. What does preventive care include?  A yearly physical exam. This is also called an annual well check.  Dental exams once or  twice a year.  Routine eye exams. Ask your health care provider how often you should have your eyes checked.  Personal lifestyle choices, including:  Daily care of your teeth and gums.  Regular physical activity.  Eating a healthy diet.  Avoiding tobacco and drug use.  Limiting alcohol use.  Practicing safe sex.  Taking low doses of aspirin every day.  Taking vitamin and mineral supplements as recommended by your health care provider. What happens during an annual well check? The services and screenings done by your health care provider during your annual well check will depend on your age, overall health, lifestyle risk factors, and family history of disease. Counseling  Your health care provider may ask you questions about your:  Alcohol use.  Tobacco use.  Drug use.  Emotional well-being.  Home and relationship well-being.  Sexual activity.  Eating habits.  History of falls.  Memory and ability to understand (cognition).  Work and work Statistician. Screening  You may have the following tests or measurements:  Height, weight, and BMI.  Blood pressure.  Lipid and cholesterol levels. These may be checked every 5 years, or more frequently if you are over 32 years old.  Skin check.  Lung cancer screening. You may have this screening every year starting at age 52 if you have a 30-pack-year history of smoking and currently smoke or have quit within the past 15 years.  Fecal occult blood test (FOBT) of the stool. You may have this test every year starting at age 71.  Flexible sigmoidoscopy or colonoscopy. You may have a sigmoidoscopy every 5 years or a colonoscopy every 10 years starting at age 37.  Prostate cancer screening. Recommendations will vary depending on your family history and other  risks.  Hepatitis C blood test.  Hepatitis B blood test.  Sexually transmitted disease (STD) testing.  Diabetes screening. This is done by checking your blood  sugar (glucose) after you have not eaten for a while (fasting). You may have this done every 1-3 years.  Abdominal aortic aneurysm (AAA) screening. You may need this if you are a current or former smoker.  Osteoporosis. You may be screened starting at age 37 if you are at high risk. Talk with your health care provider about your test results, treatment options, and if necessary, the need for more tests. Vaccines  Your health care provider may recommend certain vaccines, such as:  Influenza vaccine. This is recommended every year.  Tetanus, diphtheria, and acellular pertussis (Tdap, Td) vaccine. You may need a Td booster every 10 years.  Zoster vaccine. You may need this after age 6.  Pneumococcal 13-valent conjugate (PCV13) vaccine. One dose is recommended after age 20.  Pneumococcal polysaccharide (PPSV23) vaccine. One dose is recommended after age 12. Talk to your health care provider about which screenings and vaccines you need and how often you need them. This information is not intended to replace advice given to you by your health care provider. Make sure you discuss any questions you have with your health care provider. Document Released: 11/13/2015 Document Revised: 07/06/2016 Document Reviewed: 08/18/2015 Elsevier Interactive Patient Education  2017 [place] Prevention in the Home Falls can cause injuries. They can happen to people of all ages. There are many things you can do to make your home safe and to help prevent falls. What can I do on the outside of my home?  Regularly fix the edges of walkways and driveways and fix any cracks.  Remove anything that might make you trip as you walk through a door, such as a raised step or threshold.  Trim any bushes or trees on the path to your home.  Use bright outdoor lighting.  Clear any walking paths of anything that might make someone trip, such as rocks or tools.  Regularly check to see if handrails are loose or  broken. Make sure that both sides of any steps have handrails.  Any raised decks and porches should have guardrails on the edges.  Have any leaves, snow, or ice cleared regularly.  Use sand or salt on walking paths during winter.  Clean up any spills in your garage right away. This includes oil or grease spills. What can I do in the bathroom?  Use night lights.  Install grab bars by the toilet and in the tub and shower. Do not use towel bars as grab bars.  Use non-skid mats or decals in the tub or shower.  If you need to sit down in the shower, use a plastic, non-slip stool.  Keep the floor dry. Clean up any water that spills on the floor as soon as it happens.  Remove soap buildup in the tub or shower regularly.  Attach bath mats securely with double-sided non-slip rug tape.  Do not have throw rugs and other things on the floor that can make you trip. What can I do in the bedroom?  Use night lights.  Make sure that you have a light by your bed that is easy to reach.  Do not use any sheets or blankets that are too big for your bed. They should not hang down onto the floor.  Have a firm chair that has side arms. You can use this for support  while you get dressed.  Do not have throw rugs and other things on the floor that can make you trip. What can I do in the kitchen?  Clean up any spills right away.  Avoid walking on wet floors.  Keep items that you use a lot in easy-to-reach places.  If you need to reach something above you, use a strong step stool that has a grab bar.  Keep electrical cords out of the way.  Do not use floor polish or wax that makes floors slippery. If you must use wax, use non-skid floor wax.  Do not have throw rugs and other things on the floor that can make you trip. What can I do with my stairs?  Do not leave any items on the stairs.  Make sure that there are handrails on both sides of the stairs and use them. Fix handrails that are  broken or loose. Make sure that handrails are as long as the stairways.  Check any carpeting to make sure that it is firmly attached to the stairs. Fix any carpet that is loose or worn.  Avoid having throw rugs at the top or bottom of the stairs. If you do have throw rugs, attach them to the floor with carpet tape.  Make sure that you have a light switch at the top of the stairs and the bottom of the stairs. If you do not have them, ask someone to add them for you. What else can I do to help prevent falls?  Wear shoes that:  Do not have high heels.  Have rubber bottoms.  Are comfortable and fit you well.  Are closed at the toe. Do not wear sandals.  If you use a stepladder:  Make sure that it is fully opened. Do not climb a closed stepladder.  Make sure that both sides of the stepladder are locked into place.  Ask someone to hold it for you, if possible.  Clearly mark and make sure that you can see:  Any grab bars or handrails.  First and last steps.  Where the edge of each step is.  Use tools that help you move around (mobility aids) if they are needed. These include:  Canes.  Walkers.  Scooters.  Crutches.  Turn on the lights when you go into a dark area. Replace any light bulbs as soon as they burn out.  Set up your furniture so you have a clear path. Avoid moving your furniture around.  If any of your floors are uneven, fix them.  If there are any pets around you, be aware of where they are.  Review your medicines with your doctor. Some medicines can make you feel dizzy. This can increase your chance of falling. Ask your doctor what other things that you can do to help prevent falls. This information is not intended to replace advice given to you by your health care provider. Make sure you discuss any questions you have with your health care provider. Document Released: 08/13/2009 Document Revised: 03/24/2016 Document Reviewed: 11/21/2014 Elsevier  Interactive Patient Education  2017 [employer].

## 2020-04-20 NOTE — Telephone Encounter (Signed)
Patient called, reported his BP has dropped to 109/60 and he doesn't feel well. Would like BP to be higher.  Woke up around 6 and took his medications.  Had waffles and bacon for breakfast and then went for a long walk.  Patient reports he will have an apple and some potato chips to see if that increases his BP.

## 2020-04-24 DIAGNOSIS — N5201 Erectile dysfunction due to arterial insufficiency: Secondary | ICD-10-CM | POA: Diagnosis not present

## 2020-04-27 ENCOUNTER — Other Ambulatory Visit: Payer: Self-pay | Admitting: Internal Medicine

## 2020-04-27 NOTE — Telephone Encounter (Signed)
Dr. Judeen Hammans pt  [place] Controlled Database Checked Last filled: 02/10/2020 12 LOV w/you: 02/06/2020 Next appt w/you: 05/19/20

## 2020-05-07 ENCOUNTER — Other Ambulatory Visit: Payer: Self-pay | Admitting: Internal Medicine

## 2020-05-07 ENCOUNTER — Telehealth: Payer: Self-pay | Admitting: Pharmacist

## 2020-05-07 NOTE — Telephone Encounter (Signed)
Patient called stating that his blood pressure has been good- in the 140's that last few days, but this AM he felt flushed and took his blood pressure. It was 203/102. He states he took an antianxiety medication. I advised him to take 34m of hydralazine, try to relax and recheck in 2 hours. If blood pressure has not come down, to call me. He has apt with SNicki Reapernext week.

## 2020-05-12 ENCOUNTER — Ambulatory Visit: Payer: PPO | Admitting: Internal Medicine

## 2020-05-12 NOTE — Progress Notes (Signed)
Cardiology Office Note:    Date:  05/13/2020   ID:  Scott Oneal, DOB 1941-09-20, MRN [id number]  PCP:  Cassandria Anger, MD  Cardiologist:  Sherren Mocha, MD   Electrophysiologist:  None   Referring MD: Cassandria Anger, MD   Chief Complaint:  Follow-up (HTN, CAD, CHF)    Patient Profile:    Scott Oneal is a 79 y.o. male with:   Coronary artery disease   S/p inferior MI in 2005 >> PCI: overlapping DES to RCA   Staged PCI: DES to LCx  Cath 4/21: LCx and RCA stents patent w mod ISR, mod mid and dist LAD dz >> med rx  Combined Systolic and Diastolic CHF   Echocardiogram 12/2019: EF 45-50, Gr 2 DD  Chronic kidney disease  Hypertension   Hyperlipidemia    Prior CV studies:   Cardiac catheterization 01/31/20 pLAD 25, mLAD 65, dLAD 75 pLCx stent patent with 58 ISR; OM3 1 oRCA stent patent with 50 ISR Med Rx  Echocardiogram 01/28/20 EF 45-50, global HK, mild LVH, Gr 2 DD, GLS -16%, normal RVSF, RVSP 40.3, trivial MR, AoV sclerosis (no AS), mild dilation of Asc Aorta (37 mm)  Myoview 01/21/20 EF 47, no ischemia, intermediate risk (low EF)  Carotid US 08/09/19 bilat ICA 1-39  ABIs 11/17 Normal bilaterally  Carotid US 11/15 Bilateral ICA 1-39  PCI 06/08/04 PCI:  2.5 x 8 mm Taxus DES to distal LCx  Cardiac Catheterization 06/06/04 EF 60 LM ok LAD prox 40, Dx 50 LCx diff 30, dist 80 RCA prox 100 PCI:  33 x 30 mm Cypher DES, 8 x 30 mm Cypher DES to RCA  History of Present Illness:    Scott Oneal was last seen by Dr. Burt Knack in 01/2020.  He had a follow up in the HTN clinic in 02/2020.  His Hydralazine was DCd due to hypotensive episodes.  He has since been advised to take Hydralazine 10 mg prn for high BP.  He returns for follow up.   He is here alone.  He continues to have fluctuations in his BP.  When his BP is below 427 (systolic), he tends to feel poorly (fatigue, dizzy, loss of balance).  When his BP is closer to 140, he feels better.  He has not  had chest pain, significant shortness of breath, orthopnea, leg swelling or syncope.    Past Medical History:  Diagnosis Date  . Allergy   . Anxiety   . Arthritis   . Asthma   . Benign neoplasm of colon   . Calculus of gallbladder without mention of cholecystitis   . Cataract    bilateral cateracts removed  . Celiac disease   . Cholelithiasis   . Coronary atherosclerosis of unspecified type of vessel, native or graft   . Diarrhea   . Esophageal reflux   . Glaucoma   . Gout, unspecified   . Hyperparathyroidism   . Long term (current) use of anticoagulants   . Loss of weight   . Old myocardial infarction   . Osteoporosis, unspecified   . Other malaise and fatigue   . Other specified cardiac dysrhythmias(427.89)   . Other testicular hypofunction   . Personal history of colonic polyps   . Pure hypercholesterolemia   . Renal insufficiency    chronic  . Unspecified asthma(493.90)   . Unspecified essential hypertension   . Unspecified hypothyroidism     Current Medications: Current Meds  Medication Sig  . Ascorbic Acid (VITAMIN  C PO) Take 1 tablet by mouth every other day.  Marland Kitchen aspirin 81 MG tablet Take 81 mg by mouth every evening.   . B-D 3CC LUER-LOK SYR 21GX1" 21G X 1" 3 ML MISC USE EVERY 14 DAYS AS DIRECTED  . calcitRIOL (ROCALTROL) 0.25 MCG capsule Take 1 capsule (0.25 mcg total) by mouth daily.  . carvedilol (COREG) 25 MG tablet TAKE 1 TABLET BY MOUTH TWICE A DAY WITH MEALS  . cetirizine (ZYRTEC) 10 MG tablet Take 10 mg by mouth daily as needed for allergies.  . Cholecalciferol 2000 UNITS TABS Take 2,000 Units by mouth daily.   . clonazePAM (KLONOPIN) 0.25 MG disintegrating tablet TAKE 1 TABLET (0.25 MG TOTAL) BY MOUTH 2 (TWO) TIMES DAILY AS NEEDED (ANXIETY).  Marland Kitchen diclofenac sodium (VOLTAREN) 1 % GEL APPLY 4 G TOPICALLY 4 TIMES DAILY.  . hydrALAZINE (APRESOLINE) 10 MG tablet Take 1-2 tablet 3 times a day as needed if blood pressure is >170  . isosorbide mononitrate  (IMDUR) 30 MG 24 hr tablet Take 1 tablet (30 mg total) by mouth daily.  Marland Kitchen levothyroxine (SYNTHROID) 150 MCG tablet TAKE 1 TABLET BY MOUTH EVERY DAY  . Multiple Vitamin (MULTIVITAMIN) capsule Take 1 capsule by mouth daily.  . Multiple Vitamins-Minerals (PRESERVISION AREDS 2) CAPS Take 1 capsule by mouth in the morning and at bedtime.  . nitroGLYCERIN (NITROSTAT) 0.4 MG SL tablet Place 0.4 mg under the tongue every 5 (five) minutes as needed for chest pain.  Marland Kitchen olmesartan (BENICAR) 5 MG tablet Take 1 tablet (5 mg total) by mouth daily.  Marland Kitchen Propylene Glycol (SYSTANE BALANCE) 0.6 % SOLN Place 1 drop into both eyes daily as needed (Dry eye). systane balance  . simvastatin (ZOCOR) 10 MG tablet TAKE 1 TABLET BY MOUTH EVERYDAY AT BEDTIME  . testosterone cypionate (DEPOTESTOSTERONE CYPIONATE) 200 MG/ML injection INJECT 2ML INTO MUSCLE EVERY 2 WEEKS     Allergies:   Biaxin [clarithromycin], Allantoin-pramoxine, Amlodipine, Benzalkonium chloride, and Metoprolol tartrate   Social History   Tobacco Use  . Smoking status: Never Smoker  . Smokeless tobacco: Never Used  Vaping Use  . Vaping Use: Never used  Substance Use Topics  . Alcohol use: No  . Drug use: No     Family Hx: The patient's family history includes Colon cancer in his son; Hypertension in an other family member; Lymphoma in his father; Vision loss in his maternal grandmother. There is no history of Asthma, Esophageal cancer, Rectal cancer, or Stomach cancer.  ROS   EKGs/Labs/Other Test Reviewed:    EKG:  EKG is not  ordered today.  The ekg ordered today demonstrates n/a  Recent Labs: 07/25/2019: TSH 3.93 01/28/2020: Platelets 72 01/31/2020: Hemoglobin 13.3 02/18/2020: ALT 21 04/01/2020: BUN 20; Creatinine, Ser 2.06; Potassium 4.6; Sodium 142   Recent Lipid Panel Lab Results  Component Value Date/Time   CHOL 115 02/18/2020 07:40 AM   TRIG 115 02/18/2020 07:40 AM   HDL 34 (L) 02/18/2020 07:40 AM   CHOLHDL 3.4 02/18/2020 07:40 AM     CHOLHDL 3 05/18/2017 07:21 AM   LDLCALC 60 02/18/2020 07:40 AM    Physical Exam:    VS:  BP 112/60   Pulse 84   Ht 6' (1.829 m)   Wt 215 lb 9.6 oz (97.8 kg)   SpO2 98%   BMI 29.24 kg/m     Wt Readings from Last 3 Encounters:  05/13/20 215 lb 9.6 oz (97.8 kg)  04/20/20 214 lb 3.2 oz (97.2 kg)  04/07/20  211 lb 2 oz (95.8 kg)     Constitutional:      Appearance: Healthy appearance. Not in distress.  Neck:     Vascular: JVD normal.  Pulmonary:     Effort: Pulmonary effort is normal.     Breath sounds: No wheezing. No rales.  Cardiovascular:     Normal rate. Regular rhythm. Normal S1. Normal S2.     Murmurs: There is a grade 1/6 early systolic murmur at the URSB.  Edema:    Peripheral edema absent.  Abdominal:     Palpations: Abdomen is soft.  Skin:    General: Skin is warm and dry.  Neurological:     General: No focal deficit present.     Mental Status: Alert and oriented to person, place and time.     Cranial Nerves: Cranial nerves are intact.       ASSESSMENT & PLAN:    1. Essential hypertension He continues to have fluctuations in his BP.  He brought in a detailed list of BPs.  He has some optimal readings, some in the low 100s and some as high as 155 (systolic).  These occur at different times of day.  His diet is mostly good.  But, it sounds like he has a high salt load at times.  He also has a wrist cuff.  This has been checked with his PCP and it was accurate.   He understands how to check his BP properly.  He had been checking it multiple times a day.  But, he has reduced the frequency on his own.  I have asked him to keep a diet diary for a few weeks along with his BP log.  Hopefully, this will show a pattern that we can manage better. I have also asked him to change his Imdur to the AM and Benicar to the PM.  I will also give him a low salt diet to follow.  I will see him back in 3 mos.    2. Chronic combined systolic and diastolic heart failure (HCC) EF  45-50 by echocardiogram in 01/2020.  ? Mixed ischemic and non-ischemic cardiomyopathy.  NYHA 2.  Volume status stable.  Continue nitrates, ARB, beta-blocker.    3. Coronary artery disease involving native coronary artery of native heart with angina pectoris ([place]) Hx of DES x 2 to RCA and DES to LCx in 2005.  Cath in 01/2020 with patent stents and mod LAD dz managed medically.  He is not having angina.  Continue beta-blocker, ASA, statin.     Dispo:  Return in about 3 months (around 08/13/2020) for Routine Follow Up, w/ Richardson Dopp, PA-C, in person.   Medication Adjustments/Labs and Tests Ordered: Current medicines are reviewed at length with the patient today.  Concerns regarding medicines are outlined above.  Tests Ordered: Orders Placed This Encounter  Procedures  . EKG 12-Lead   Medication Changes: No orders of the defined types were placed in this encounter.   Signed, Richardson Dopp, PA-C  05/13/2020 11:49 AM    [facility] [address], [place], [place]  [address] Phone: [phone]; Fax: [phone]

## 2020-05-13 ENCOUNTER — Other Ambulatory Visit: Payer: Self-pay

## 2020-05-13 ENCOUNTER — Encounter: Payer: Self-pay | Admitting: Physician Assistant

## 2020-05-13 ENCOUNTER — Ambulatory Visit (INDEPENDENT_AMBULATORY_CARE_PROVIDER_SITE_OTHER): Payer: PPO | Admitting: Physician Assistant

## 2020-05-13 VITALS — BP 112/60 | HR 84 | Ht 72.0 in | Wt 215.6 lb

## 2020-05-13 DIAGNOSIS — I10 Essential (primary) hypertension: Secondary | ICD-10-CM | POA: Diagnosis not present

## 2020-05-13 DIAGNOSIS — I5042 Chronic combined systolic (congestive) and diastolic (congestive) heart failure: Secondary | ICD-10-CM

## 2020-05-13 DIAGNOSIS — I25119 Atherosclerotic heart disease of native coronary artery with unspecified angina pectoris: Secondary | ICD-10-CM

## 2020-05-13 NOTE — Patient Instructions (Signed)
Medication Instructions:  Your physician recommends that you continue on your current medications as directed. Please refer to the Current Medication list given to you today.  1) Take Imdur and Coreg in the morning 2) Take Benicar and Coreg at night  *If you need a refill on your cardiac medications before your next appointment, please call your pharmacy*  Lab Work: None ordered today  Testing/Procedures: None ordered today  Follow-Up:  On 08/14/20 at 12:15PM with Richardson Dopp, PA-C  Other Instructions Check blood pressure 1-2 times a day; if possible get an arm cuff to check your blood pressure. Keep a diet diary.   Low-Sodium Eating Plan Sodium, which is an element that makes up salt, helps you maintain a healthy balance of fluids in your body. Too much sodium can increase your blood pressure and cause fluid and waste to be held in your body. Your health care provider or dietitian may recommend following this plan if you have high blood pressure (hypertension), kidney disease, liver disease, or heart failure. Eating less sodium can help lower your blood pressure, reduce swelling, and protect your heart, liver, and kidneys. What are tips for following this plan? General guidelines  Most people on this plan should limit their sodium intake to 1,500-2,000 mg (milligrams) of sodium each day. Reading food labels   The Nutrition Facts label lists the amount of sodium in one serving of the food. If you eat more than one serving, you must multiply the listed amount of sodium by the number of servings.  Choose foods with less than 140 mg of sodium per serving.  Avoid foods with 300 mg of sodium or more per serving. Shopping  Look for lower-sodium products, often labeled as "low-sodium" or "no salt added."  Always check the sodium content even if foods are labeled as "unsalted" or "no salt added".  Buy fresh foods. ? Avoid canned foods and premade or frozen meals. ? Avoid canned,  cured, or processed meats  Buy breads that have less than 80 mg of sodium per slice. Cooking  Eat more home-cooked food and less restaurant, buffet, and fast food.  Avoid adding salt when cooking. Use salt-free seasonings or herbs instead of table salt or sea salt. Check with your health care provider or pharmacist before using salt substitutes.  Cook with plant-based oils, such as canola, sunflower, or olive oil. Meal planning  When eating at a restaurant, ask that your food be prepared with less salt or no salt, if possible.  Avoid foods that contain MSG (monosodium glutamate). MSG is sometimes added to [place] food, bouillon, and some canned foods. What foods are recommended? The items listed may not be a complete list. Talk with your dietitian about what dietary choices are best for you. Grains Low-sodium cereals, including oats, puffed wheat and rice, and shredded wheat. Low-sodium crackers. Unsalted rice. Unsalted pasta. Low-sodium bread. Whole-grain breads and whole-grain pasta. Vegetables Fresh or frozen vegetables. "No salt added" canned vegetables. "No salt added" tomato sauce and paste. Low-sodium or reduced-sodium tomato and vegetable juice. Fruits Fresh, frozen, or canned fruit. Fruit juice. Meats and other protein foods Fresh or frozen (no salt added) meat, poultry, seafood, and fish. Low-sodium canned tuna and salmon. Unsalted nuts. Dried peas, beans, and lentils without added salt. Unsalted canned beans. Eggs. Unsalted nut butters. Dairy Milk. Soy milk. Cheese that is naturally low in sodium, such as ricotta cheese, fresh mozzarella, or Swiss cheese Low-sodium or reduced-sodium cheese. Cream cheese. Yogurt. Fats and oils Unsalted butter.  Unsalted margarine with no trans fat. Vegetable oils such as canola or olive oils. Seasonings and other foods Fresh and dried herbs and spices. Salt-free seasonings. Low-sodium mustard and ketchup. Sodium-free salad dressing. Sodium-free  light mayonnaise. Fresh or refrigerated horseradish. Lemon juice. Vinegar. Homemade, reduced-sodium, or low-sodium soups. Unsalted popcorn and pretzels. Low-salt or salt-free chips. What foods are not recommended? The items listed may not be a complete list. Talk with your dietitian about what dietary choices are best for you. Grains Instant hot cereals. Bread stuffing, pancake, and biscuit mixes. Croutons. Seasoned rice or pasta mixes. Noodle soup cups. Boxed or frozen macaroni and cheese. Regular salted crackers. Self-rising flour. Vegetables Sauerkraut, pickled vegetables, and relishes. Olives. [place] fries. Onion rings. Regular canned vegetables (not low-sodium or reduced-sodium). Regular canned tomato sauce and paste (not low-sodium or reduced-sodium). Regular tomato and vegetable juice (not low-sodium or reduced-sodium). Frozen vegetables in sauces. Meats and other protein foods Meat or fish that is salted, canned, smoked, spiced, or pickled. Bacon, ham, sausage, hotdogs, corned beef, chipped beef, packaged lunch meats, salt pork, jerky, pickled herring, anchovies, regular canned tuna, sardines, salted nuts. Dairy Processed cheese and cheese spreads. Cheese curds. Blue cheese. Feta cheese. String cheese. Regular cottage cheese. Buttermilk. Canned milk. Fats and oils Salted butter. Regular margarine. Ghee. Bacon fat. Seasonings and other foods Onion salt, garlic salt, seasoned salt, table salt, and sea salt. Canned and packaged gravies. Worcestershire sauce. Tartar sauce. Barbecue sauce. Teriyaki sauce. Soy sauce, including reduced-sodium. Steak sauce. Fish sauce. Oyster sauce. Cocktail sauce. Horseradish that you find on the shelf. Regular ketchup and mustard. Meat flavorings and tenderizers. Bouillon cubes. Hot sauce and Tabasco sauce. Premade or packaged marinades. Premade or packaged taco seasonings. Relishes. Regular salad dressings. Salsa. Potato and tortilla chips. Corn chips and puffs.  Salted popcorn and pretzels. Canned or dried soups. Pizza. Frozen entrees and pot pies. Summary  Eating less sodium can help lower your blood pressure, reduce swelling, and protect your heart, liver, and kidneys.  Most people on this plan should limit their sodium intake to 1,500-2,000 mg (milligrams) of sodium each day.  Canned, boxed, and frozen foods are high in sodium. Restaurant foods, fast foods, and pizza are also very high in sodium. You also get sodium by adding salt to food.  Try to cook at home, eat more fresh fruits and vegetables, and eat less fast food, canned, processed, or prepared foods. This information is not intended to replace advice given to you by your health care provider. Make sure you discuss any questions you have with your health care provider. Document Revised: 09/29/2017 Document Reviewed: 10/10/2016 Elsevier Patient Education  2020 [employer].

## 2020-05-19 ENCOUNTER — Other Ambulatory Visit: Payer: Self-pay

## 2020-05-19 ENCOUNTER — Encounter: Payer: Self-pay | Admitting: Internal Medicine

## 2020-05-19 ENCOUNTER — Ambulatory Visit (INDEPENDENT_AMBULATORY_CARE_PROVIDER_SITE_OTHER): Payer: PPO | Admitting: Internal Medicine

## 2020-05-19 DIAGNOSIS — I2510 Atherosclerotic heart disease of native coronary artery without angina pectoris: Secondary | ICD-10-CM

## 2020-05-19 DIAGNOSIS — M25559 Pain in unspecified hip: Secondary | ICD-10-CM | POA: Diagnosis not present

## 2020-05-19 DIAGNOSIS — N1831 Chronic kidney disease, stage 3a: Secondary | ICD-10-CM

## 2020-05-19 DIAGNOSIS — I10 Essential (primary) hypertension: Secondary | ICD-10-CM | POA: Diagnosis not present

## 2020-05-19 DIAGNOSIS — K900 Celiac disease: Secondary | ICD-10-CM | POA: Diagnosis not present

## 2020-05-19 DIAGNOSIS — E034 Atrophy of thyroid (acquired): Secondary | ICD-10-CM | POA: Diagnosis not present

## 2020-05-19 MED ORDER — METHYLPREDNISOLONE ACETATE 80 MG/ML IJ SUSP
80.0000 mg | Freq: Once | INTRAMUSCULAR | Status: AC
  Administered 2020-05-19: 80 mg via INTRAMUSCULAR

## 2020-05-19 NOTE — Patient Instructions (Signed)
Postprocedure instructions :    A Band-Aid should be left on for 12 hours. Injection therapy is not a cure itself. It is used in conjunction with other modalities. You can use nonsteroidal anti-inflammatories like ibuprofen , hot and cold compresses. Rest is recommended in the next 24 hours. You need to report immediately  if fever, chills or any signs of infection develop. 

## 2020-05-19 NOTE — Assessment & Plan Note (Signed)
BP ok at home

## 2020-05-19 NOTE — Progress Notes (Signed)
Subjective:  Patient ID: Scott Oneal, male    DOB: 29-Jul-1941  Age: 79 y.o. MRN: [id number]  CC: No chief complaint on file.   HPI FRAN MCREE presents for DM, LBP, CAD, HTN  F/u C/o L hip pain - worse  Outpatient Medications Prior to Visit  Medication Sig Dispense Refill  . Ascorbic Acid (VITAMIN C PO) Take 1 tablet by mouth every other day.    Marland Kitchen aspirin 81 MG tablet Take 81 mg by mouth every evening.     . B-D 3CC LUER-LOK SYR 21GX1" 21G X 1" 3 ML MISC USE EVERY 14 DAYS AS DIRECTED 50 each 2  . calcitRIOL (ROCALTROL) 0.25 MCG capsule Take 1 capsule (0.25 mcg total) by mouth daily. 90 capsule 1  . carvedilol (COREG) 25 MG tablet TAKE 1 TABLET BY MOUTH TWICE A DAY WITH MEALS 180 tablet 3  . cetirizine (ZYRTEC) 10 MG tablet Take 10 mg by mouth daily as needed for allergies.    . Cholecalciferol 2000 UNITS TABS Take 2,000 Units by mouth daily.     . clonazePAM (KLONOPIN) 0.25 MG disintegrating tablet TAKE 1 TABLET (0.25 MG TOTAL) BY MOUTH 2 (TWO) TIMES DAILY AS NEEDED (ANXIETY). 60 tablet 3  . diclofenac sodium (VOLTAREN) 1 % GEL APPLY 4 G TOPICALLY 4 TIMES DAILY. 200 g 5  . hydrALAZINE (APRESOLINE) 10 MG tablet Take 1-2 tablet 3 times a day as needed if blood pressure is >170 60 tablet 3  . levothyroxine (SYNTHROID) 150 MCG tablet TAKE 1 TABLET BY MOUTH EVERY DAY 90 tablet 3  . Multiple Vitamin (MULTIVITAMIN) capsule Take 1 capsule by mouth daily.    . Multiple Vitamins-Minerals (PRESERVISION AREDS 2) CAPS Take 1 capsule by mouth in the morning and at bedtime.    . nitroGLYCERIN (NITROSTAT) 0.4 MG SL tablet Place 0.4 mg under the tongue every 5 (five) minutes as needed for chest pain.    Marland Kitchen olmesartan (BENICAR) 5 MG tablet Take 1 tablet (5 mg total) by mouth daily. 30 tablet 11  . Propylene Glycol (SYSTANE BALANCE) 0.6 % SOLN Place 1 drop into both eyes daily as needed (Dry eye). systane balance    . simvastatin (ZOCOR) 10 MG tablet TAKE 1 TABLET BY MOUTH EVERYDAY AT BEDTIME 90  tablet 3  . testosterone cypionate (DEPOTESTOSTERONE CYPIONATE) 200 MG/ML injection INJECT 2ML INTO MUSCLE EVERY 2 WEEKS 12 mL 0  . isosorbide mononitrate (IMDUR) 30 MG 24 hr tablet Take 1 tablet (30 mg total) by mouth daily. 90 tablet 3   No facility-administered medications prior to visit.    ROS: Review of Systems  Constitutional: Negative for appetite change, fatigue and unexpected weight change.  HENT: Negative for congestion, nosebleeds, sneezing, sore throat and trouble swallowing.   Eyes: Negative for itching and visual disturbance.  Respiratory: Negative for cough.   Cardiovascular: Negative for chest pain, palpitations and leg swelling.  Gastrointestinal: Negative for abdominal distention, blood in stool, diarrhea and nausea.  Genitourinary: Negative for frequency and hematuria.  Musculoskeletal: Positive for arthralgias and back pain. Negative for gait problem, joint swelling and neck pain.  Skin: Negative for rash.  Neurological: Negative for dizziness, tremors, speech difficulty and weakness.  Psychiatric/Behavioral: Negative for agitation, dysphoric mood and sleep disturbance. The patient is not nervous/anxious.     Objective:  BP 136/72 (BP Location: Left Arm, Patient Position: Sitting, Cuff Size: Large)   Pulse 64   Temp 98.6 F (37 C) (Oral)   Ht 6' (1.829 m)  Wt 217 lb (98.4 kg)   SpO2 98%   BMI 29.43 kg/m   BP Readings from Last 3 Encounters:  05/19/20 136/72  05/13/20 112/60  04/20/20 124/70    Wt Readings from Last 3 Encounters:  05/19/20 217 lb (98.4 kg)  05/13/20 215 lb 9.6 oz (97.8 kg)  04/20/20 214 lb 3.2 oz (97.2 kg)    Physical Exam Constitutional:      General: He is not in acute distress.    Appearance: He is well-developed.     Comments: NAD  Eyes:     Conjunctiva/sclera: Conjunctivae normal.     Pupils: Pupils are equal, round, and reactive to light.  Neck:     Thyroid: No thyromegaly.     Vascular: No JVD.  Cardiovascular:      Rate and Rhythm: Normal rate and regular rhythm.     Heart sounds: Normal heart sounds. No murmur heard.  No friction rub. No gallop.   Pulmonary:     Effort: Pulmonary effort is normal. No respiratory distress.     Breath sounds: Normal breath sounds. No wheezing or rales.  Chest:     Chest wall: No tenderness.  Abdominal:     General: Bowel sounds are normal. There is no distension.     Palpations: Abdomen is soft. There is no mass.     Tenderness: There is no abdominal tenderness. There is no guarding or rebound.  Musculoskeletal:        General: No tenderness. Normal range of motion.     Cervical back: Normal range of motion.  Lymphadenopathy:     Cervical: No cervical adenopathy.  Skin:    General: Skin is warm and dry.     Findings: No rash.  Neurological:     Mental Status: He is alert and oriented to person, place, and time.     Cranial Nerves: No cranial nerve deficit.     Motor: No abnormal muscle tone.     Coordination: Coordination normal.     Gait: Gait normal.     Deep Tendon Reflexes: Reflexes are normal and symmetric.  Psychiatric:        Behavior: Behavior normal.        Thought Content: Thought content normal.        Judgment: Judgment normal.    L lat hip w/pain   Procedure Note :     Procedure : Joint Injection,  L  hip   Indication:  Trochanteric bursitis with refractory  chronic pain.   Risks including unsuccessful procedure , bleeding, infection, bruising, skin atrophy, "steroid flare-up" and others were explained to the patient in detail as well as the benefits. Informed consent was obtained and signed.   Tthe patient was placed in a comfortable lateral decubitus position. The point of maximal tenderness was identified. Skin was prepped with Betadine and alcohol. Then, a 5 cc syringe with a 2 inch long 24-gauge needle was used for a bursa injection.. The needle was advanced  Into the bursa. I injected the bursa with 4 mL of 2% lidocaine and 80 mg of  Depo-Medrol .  Band-Aid was applied.   Tolerated well. Complications: None. Good pain relief following the procedure.   Postprocedure instructions :    A Band-Aid should be left on for 12 hours. Injection therapy is not a cure itself. It is used in conjunction with other modalities. You can use nonsteroidal anti-inflammatories like ibuprofen , hot and cold compresses. Rest is recommended in the next  24 hours. You need to report immediately  if fever, chills or any signs of infection develop.   Lab Results  Component Value Date   WBC 5.8 01/28/2020   HGB 13.3 01/31/2020   HCT 39.0 01/31/2020   PLT 72 (LL) 01/28/2020   GLUCOSE 115 (H) 04/01/2020   CHOL 115 02/18/2020   TRIG 115 02/18/2020   HDL 34 (L) 02/18/2020   LDLCALC 60 02/18/2020   ALT 21 02/18/2020   AST 28 02/18/2020   NA 142 04/01/2020   K 4.6 04/01/2020   CL 103 04/01/2020   CREATININE 2.06 (H) 04/01/2020   BUN 20 04/01/2020   CO2 25 04/01/2020   TSH 3.93 07/25/2019   PSA 2.64 04/24/2019   INR 1.20 11/26/2014   HGBA1C 5.9 03/15/2016   MICROALBUR 1.2 05/18/2017    No results found.  Assessment & Plan:    Walker Kehr, MD

## 2020-05-19 NOTE — Assessment & Plan Note (Signed)
Imdur No angina

## 2020-05-19 NOTE — Assessment & Plan Note (Signed)
Gluten free diet

## 2020-05-19 NOTE — Assessment & Plan Note (Signed)
Levothroid

## 2020-05-19 NOTE — Assessment & Plan Note (Signed)
BP control Monitor labs

## 2020-05-19 NOTE — Addendum Note (Signed)
Addended by: Darlys Gales on: 05/19/2020 04:19 PM   Modules accepted: Orders

## 2020-05-19 NOTE — Assessment & Plan Note (Signed)
Will inject

## 2020-06-19 DIAGNOSIS — I700 Atherosclerosis of aorta: Secondary | ICD-10-CM | POA: Diagnosis not present

## 2020-06-19 DIAGNOSIS — N200 Calculus of kidney: Secondary | ICD-10-CM | POA: Diagnosis not present

## 2020-06-19 DIAGNOSIS — K8020 Calculus of gallbladder without cholecystitis without obstruction: Secondary | ICD-10-CM | POA: Diagnosis not present

## 2020-06-19 DIAGNOSIS — R3121 Asymptomatic microscopic hematuria: Secondary | ICD-10-CM | POA: Diagnosis not present

## 2020-06-19 DIAGNOSIS — K753 Granulomatous hepatitis, not elsewhere classified: Secondary | ICD-10-CM | POA: Diagnosis not present

## 2020-06-19 DIAGNOSIS — N5201 Erectile dysfunction due to arterial insufficiency: Secondary | ICD-10-CM | POA: Diagnosis not present

## 2020-07-21 DIAGNOSIS — M545 Low back pain: Secondary | ICD-10-CM | POA: Diagnosis not present

## 2020-07-21 DIAGNOSIS — M7062 Trochanteric bursitis, left hip: Secondary | ICD-10-CM | POA: Diagnosis not present

## 2020-07-26 ENCOUNTER — Other Ambulatory Visit: Payer: Self-pay | Admitting: Internal Medicine

## 2020-07-27 ENCOUNTER — Other Ambulatory Visit: Payer: Self-pay

## 2020-07-27 NOTE — Telephone Encounter (Signed)
[place] Controlled Database Checked Last filled: 05/03/2020 (12) LOV w/you: 05/19/2020 Next appt w/you: 08/24/2020

## 2020-07-28 ENCOUNTER — Ambulatory Visit (INDEPENDENT_AMBULATORY_CARE_PROVIDER_SITE_OTHER): Payer: PPO

## 2020-07-28 ENCOUNTER — Other Ambulatory Visit: Payer: Self-pay

## 2020-07-28 DIAGNOSIS — Z23 Encounter for immunization: Secondary | ICD-10-CM

## 2020-07-30 MED ORDER — TESTOSTERONE CYPIONATE 200 MG/ML IM SOLN: INTRAMUSCULAR | 0 refills | Status: DC

## 2020-08-14 ENCOUNTER — Ambulatory Visit: Payer: PPO | Admitting: Physician Assistant

## 2020-08-17 DIAGNOSIS — I129 Hypertensive chronic kidney disease with stage 1 through stage 4 chronic kidney disease, or unspecified chronic kidney disease: Secondary | ICD-10-CM | POA: Diagnosis not present

## 2020-08-17 DIAGNOSIS — R319 Hematuria, unspecified: Secondary | ICD-10-CM | POA: Diagnosis not present

## 2020-08-17 DIAGNOSIS — N320 Bladder-neck obstruction: Secondary | ICD-10-CM | POA: Diagnosis not present

## 2020-08-17 DIAGNOSIS — Z6841 Body Mass Index (BMI) 40.0 and over, adult: Secondary | ICD-10-CM | POA: Diagnosis not present

## 2020-08-17 DIAGNOSIS — N183 Chronic kidney disease, stage 3 unspecified: Secondary | ICD-10-CM | POA: Diagnosis not present

## 2020-08-18 NOTE — Progress Notes (Signed)
Cardiology Office Note:    Date:  08/19/2020   ID:  Leah, Skora 04-Feb-1941, MRN [id number]  PCP:  Cassandria Anger, MD  [facility] Cardiologist:  Sherren Mocha, MD   [place] Electrophysiologist:  None  Nephrologist: Dr. Moshe Cipro   Referring MD: Cassandria Anger, MD   Chief Complaint:  Follow-up (HTN)    Patient Profile:    Scott Oneal is a 79 y.o. male with:   Coronary artery disease  ? S/p inferior MI in 2005 >> PCI: overlapping DES to RCA   Staged PCI: DES to LCx ? Cath 4/21: LCx and RCA stents patent w mod ISR, mod mid and dist LAD dz >> med rx  Combined Systolic and Diastolic CHF  ? Echocardiogram 12/2019: EF 45-50, Gr 2 DD  Chronic kidney disease  Hypertension   Hyperlipidemia   Prior CV studies:   Cardiac catheterization 01/31/20 pLAD 25, mLAD 65, dLAD 75 pLCx stent patent with 50 ISR; OM3 30 oRCA stent patent with 50 ISR Med Rx  Echocardiogram 01/28/20 EF 45-50, global HK, mild LVH, Gr 2 DD, GLS -16%, normal RVSF, RVSP 40.3, trivial MR, AoV sclerosis (no AS), mild dilation of Asc Aorta (37 mm)  Myoview 01/21/20 EF 47, no ischemia, intermediate risk (low EF)  Carotid US 08/09/19 bilat ICA 1-39  ABIs 11/17 Normal bilaterally  Carotid US 11/15 Bilateral ICA 1-39  PCI 06/08/04 PCI: 2.5 x 8 mm Taxus DES to distal LCx  Cardiac Catheterization8/7/05 EF 60 LM ok LAD prox 40, Dx 50 LCx diff 30, dist 80 RCA prox 100 PCI: 33 x 30 mm Cypher DES, 8 x 30 mm Cypher DES to RCA  History of Present Illness:    Mr. Abercrombie was last seen in 04/2020.  He continued to have fluctuations in his BP.  I asked him to changed the timing of his Isosorbide and Benicar and to keep a diet diary to see if his salt intake was larger than he knew.  He returns for follow up.  He is here alone.  Overall, his BP readings have been ranging 120s-140s since last seen.  He now has an arm cuff and he is trying to limit salt.  He has not had chest  pain, shortness of breath, syncope.  He notes that his nephrologist recently did follow up labs and he needs earlier follow up based upon the results.     Past Medical History:  Diagnosis Date  . Allergy   . Anxiety   . Arthritis   . Asthma   . Benign neoplasm of colon   . Calculus of gallbladder without mention of cholecystitis   . Cataract    bilateral cateracts removed  . Celiac disease   . Cholelithiasis   . Coronary atherosclerosis of unspecified type of vessel, native or graft   . Diarrhea   . Esophageal reflux   . Glaucoma   . Gout, unspecified   . Hyperparathyroidism   . Long term (current) use of anticoagulants   . Loss of weight   . Old myocardial infarction   . Osteoporosis, unspecified   . Other malaise and fatigue   . Other specified cardiac dysrhythmias(427.89)   . Other testicular hypofunction   . Personal history of colonic polyps   . Pure hypercholesterolemia   . Renal insufficiency    chronic  . Unspecified asthma(493.90)   . Unspecified essential hypertension   . Unspecified hypothyroidism     Current Medications: Current Meds  Medication  Sig  . Ascorbic Acid (VITAMIN C PO) Take 1 tablet by mouth every other day.  Marland Kitchen aspirin 81 MG tablet Take 81 mg by mouth every evening.   . B-D 3CC LUER-LOK SYR 21GX1" 21G X 1" 3 ML MISC USE EVERY 14 DAYS AS DIRECTED  . calcitRIOL (ROCALTROL) 0.25 MCG capsule Take 1 capsule (0.25 mcg total) by mouth daily.  . carvedilol (COREG) 25 MG tablet TAKE 1 TABLET BY MOUTH TWICE A DAY WITH MEALS  . cetirizine (ZYRTEC) 10 MG tablet Take 10 mg by mouth daily as needed for allergies.  . Cholecalciferol 2000 UNITS TABS Take 2,000 Units by mouth daily.   . clonazePAM (KLONOPIN) 0.25 MG disintegrating tablet TAKE 1 TABLET (0.25 MG TOTAL) BY MOUTH 2 (TWO) TIMES DAILY AS NEEDED (ANXIETY).  Marland Kitchen diclofenac sodium (VOLTAREN) 1 % GEL APPLY 4 G TOPICALLY 4 TIMES DAILY.  . hydrALAZINE (APRESOLINE) 10 MG tablet Take 1-2 tablet 3 times a day as  needed if blood pressure is >170  . levothyroxine (SYNTHROID) 150 MCG tablet TAKE 1 TABLET BY MOUTH EVERY DAY  . Multiple Vitamin (MULTIVITAMIN) capsule Take 1 capsule by mouth daily.  . Multiple Vitamins-Minerals (PRESERVISION AREDS 2) CAPS Take 1 capsule by mouth in the morning and at bedtime.  . nitroGLYCERIN (NITROSTAT) 0.4 MG SL tablet Place 0.4 mg under the tongue every 5 (five) minutes as needed for chest pain.  Marland Kitchen olmesartan (BENICAR) 5 MG tablet Take 1 tablet (5 mg total) by mouth daily.  Marland Kitchen Propylene Glycol (SYSTANE BALANCE) 0.6 % SOLN Place 1 drop into both eyes daily as needed (Dry eye). systane balance  . simvastatin (ZOCOR) 10 MG tablet TAKE 1 TABLET BY MOUTH EVERYDAY AT BEDTIME  . testosterone cypionate (DEPOTESTOSTERONE CYPIONATE) 200 MG/ML injection INJECT 2ML INTO MUSCLE EVERY 2 WEEKS     Allergies:   Biaxin [clarithromycin], Allantoin-pramoxine, Amlodipine, Benzalkonium chloride, and Metoprolol tartrate   Social History   Tobacco Use  . Smoking status: Never Smoker  . Smokeless tobacco: Never Used  Vaping Use  . Vaping Use: Never used  Substance Use Topics  . Alcohol use: No  . Drug use: No     Family Hx: The patient's family history includes Colon cancer in his son; Hypertension in an other family member; Lymphoma in his father; Vision loss in his maternal grandmother. There is no history of Asthma, Esophageal cancer, Rectal cancer, or Stomach cancer.  ROS   EKGs/Labs/Other Test Reviewed:    EKG:  EKG is   ordered today.  The ekg ordered today demonstrates sinus bradycardia, HR 57, normal axis, inferior Q waves, T wave inversions 2, 3, aVF, QTC 391, no change since prior tracing dated 02/13/2020  Recent Labs: 01/28/2020: Platelets 72 01/31/2020: Hemoglobin 13.3 02/18/2020: ALT 21 04/01/2020: BUN 20; Creatinine, Ser 2.06; Potassium 4.6; Sodium 142   Recent Lipid Panel Lab Results  Component Value Date/Time   CHOL 115 02/18/2020 07:40 AM   TRIG 115 02/18/2020  07:40 AM   HDL 34 (L) 02/18/2020 07:40 AM   CHOLHDL 3.4 02/18/2020 07:40 AM   CHOLHDL 3 05/18/2017 07:21 AM   LDLCALC 60 02/18/2020 07:40 AM      Risk Assessment/Calculations:      Physical Exam:    VS:  BP (!) 142/82   Pulse (!) 57   Ht 6' (1.829 m)   Wt 212 lb 12.8 oz (96.5 kg)   SpO2 100%   BMI 28.86 kg/m     Wt Readings from Last 3 Encounters:  08/19/20 212 lb 12.8 oz (96.5 kg)  05/19/20 217 lb (98.4 kg)  05/13/20 215 lb 9.6 oz (97.8 kg)     Constitutional:      Appearance: Healthy appearance. Not in distress.  Pulmonary:     Effort: Pulmonary effort is normal.     Breath sounds: No wheezing. No rales.  Cardiovascular:     Normal rate. Regular rhythm. Normal S1. Normal S2.     Murmurs: There is no murmur.  Edema:    Peripheral edema absent.  Abdominal:     Palpations: Abdomen is soft.  Musculoskeletal:     Cervical back: Neck supple. Skin:    General: Skin is warm and dry.  Neurological:     General: No focal deficit present.     Mental Status: Alert and oriented to person, place and time.     Cranial Nerves: Cranial nerves are intact.       ASSESSMENT & PLAN:    1. Essential hypertension Overall, his blood pressures have been fairly stable since last seen.  Given his wide fluctuations in blood pressures in the past, I have suggested that we continue his current medications.  I have asked him to notify us if he starts to see an average blood pressure ranging 116 systolic or higher.  He can follow-up in 6 months with Dr. Burt Knack or sooner if his blood pressures become more uncontrolled.    Dispo:  Return in about 6 months (around 02/17/2021) for Routine Follow Up, w/ Dr. Burt Knack, in person.   Medication Adjustments/Labs and Tests Ordered: Current medicines are reviewed at length with the patient today.  Concerns regarding medicines are outlined above.  Tests Ordered: Orders Placed This Encounter  Procedures  . EKG 12-Lead   Medication Changes: No  orders of the defined types were placed in this encounter.   Signed, Richardson Dopp, PA-C  08/19/2020 11:45 AM    [facility] [address], [place], [place]  [address] Phone: [phone]; Fax: [phone]

## 2020-08-19 ENCOUNTER — Other Ambulatory Visit: Payer: Self-pay

## 2020-08-19 ENCOUNTER — Ambulatory Visit: Payer: PPO | Admitting: Physician Assistant

## 2020-08-19 ENCOUNTER — Encounter: Payer: Self-pay | Admitting: Physician Assistant

## 2020-08-19 VITALS — BP 142/82 | HR 57 | Ht 72.0 in | Wt 212.8 lb

## 2020-08-19 DIAGNOSIS — I10 Essential (primary) hypertension: Secondary | ICD-10-CM

## 2020-08-19 DIAGNOSIS — I5042 Chronic combined systolic (congestive) and diastolic (congestive) heart failure: Secondary | ICD-10-CM

## 2020-08-19 DIAGNOSIS — I25119 Atherosclerotic heart disease of native coronary artery with unspecified angina pectoris: Secondary | ICD-10-CM

## 2020-08-19 NOTE — Patient Instructions (Signed)
Medication Instructions:  Your physician recommends that you continue on your current medications as directed. Please refer to the Current Medication list given to you today.  *If you need a refill on your cardiac medications before your next appointment, please call your pharmacy*  Lab Work: None ordered today  Testing/Procedures: None ordered today  Follow-Up: At [facility], you and your health needs are our priority.  As part of our continuing mission to provide you with exceptional heart care, we have created designated Provider Care Teams.  These Care Teams include your primary Cardiologist (physician) and Advanced Practice Providers (APPs -  Physician Assistants and Nurse Practitioners) who all work together to provide you with the care you need, when you need it.  Your next appointment:   6 month(s)  The format for your next appointment:   In Person  Provider:   Sherren Mocha, MD

## 2020-08-24 ENCOUNTER — Other Ambulatory Visit: Payer: Self-pay

## 2020-08-24 ENCOUNTER — Encounter: Payer: Self-pay | Admitting: Internal Medicine

## 2020-08-24 ENCOUNTER — Ambulatory Visit (INDEPENDENT_AMBULATORY_CARE_PROVIDER_SITE_OTHER): Payer: PPO | Admitting: Internal Medicine

## 2020-08-24 VITALS — BP 162/80 | HR 64 | Temp 98.7°F | Ht 72.0 in | Wt 212.2 lb

## 2020-08-24 DIAGNOSIS — N320 Bladder-neck obstruction: Secondary | ICD-10-CM

## 2020-08-24 DIAGNOSIS — G4700 Insomnia, unspecified: Secondary | ICD-10-CM | POA: Diagnosis not present

## 2020-08-24 DIAGNOSIS — K900 Celiac disease: Secondary | ICD-10-CM | POA: Diagnosis not present

## 2020-08-24 DIAGNOSIS — R200 Anesthesia of skin: Secondary | ICD-10-CM | POA: Diagnosis not present

## 2020-08-24 DIAGNOSIS — I10 Essential (primary) hypertension: Secondary | ICD-10-CM

## 2020-08-24 LAB — COMPREHENSIVE METABOLIC PANEL
ALT: 23 U/L (ref 0–53)
AST: 24 U/L (ref 0–37)
Albumin: 4 g/dL (ref 3.5–5.2)
Alkaline Phosphatase: 61 U/L (ref 39–117)
BUN: 17 mg/dL (ref 6–23)
CO2: 33 mEq/L — ABNORMAL HIGH (ref 19–32)
Calcium: 9.5 mg/dL (ref 8.4–10.5)
Chloride: 101 mEq/L (ref 96–112)
Creatinine, Ser: 2 mg/dL — ABNORMAL HIGH (ref 0.40–1.50)
GFR: 31.29 mL/min — ABNORMAL LOW (ref >60.00)
Glucose, Bld: 77 mg/dL (ref 70–99)
Potassium: 4.8 mEq/L (ref 3.5–5.1)
Sodium: 140 mEq/L (ref 135–145)
Total Bilirubin: 1.1 mg/dL (ref 0.2–1.2)
Total Protein: 6 g/dL (ref 6.0–8.3)

## 2020-08-24 LAB — SEDIMENTATION RATE: Sed Rate: 4 mm/hr (ref 0–20)

## 2020-08-24 LAB — TSH: TSH: 3.84 u[IU]/mL (ref 0.35–4.50)

## 2020-08-24 LAB — VITAMIN B12: Vitamin B-12: 542 pg/mL (ref 211–911)

## 2020-08-24 LAB — PSA: PSA: 2.08 ng/mL (ref 0.10–4.00)

## 2020-08-24 NOTE — Addendum Note (Signed)
Addended by: Cresenciano Lick on: 08/24/2020 10:58 AM   Modules accepted: Orders

## 2020-08-24 NOTE — Assessment & Plan Note (Signed)
B neuropathy Labs

## 2020-08-24 NOTE — Progress Notes (Signed)
Subjective:  Patient ID: Scott Oneal, male    DOB: 08/21/1941  Age: 79 y.o. MRN: [id number]  CC: Follow-up   HPI Scott Oneal presents for HTN, celiac disease, CKD C/o weakness when BP is down C/o insomnia On diet SBP 138-150  Outpatient Medications Prior to Visit  Medication Sig Dispense Refill  . Ascorbic Acid (VITAMIN C PO) Take 1 tablet by mouth every other day.    Marland Kitchen aspirin 81 MG tablet Take 81 mg by mouth every evening.     . B-D 3CC LUER-LOK SYR 21GX1" 21G X 1" 3 ML MISC USE EVERY 14 DAYS AS DIRECTED 50 each 2  . calcitRIOL (ROCALTROL) 0.25 MCG capsule Take 1 capsule (0.25 mcg total) by mouth daily. 90 capsule 1  . carvedilol (COREG) 25 MG tablet TAKE 1 TABLET BY MOUTH TWICE A DAY WITH MEALS 180 tablet 3  . cetirizine (ZYRTEC) 10 MG tablet Take 10 mg by mouth daily as needed for allergies.    . Cholecalciferol 2000 UNITS TABS Take 2,000 Units by mouth daily.     . clonazePAM (KLONOPIN) 0.25 MG disintegrating tablet TAKE 1 TABLET (0.25 MG TOTAL) BY MOUTH 2 (TWO) TIMES DAILY AS NEEDED (ANXIETY). 60 tablet 3  . diclofenac sodium (VOLTAREN) 1 % GEL APPLY 4 G TOPICALLY 4 TIMES DAILY. 200 g 5  . hydrALAZINE (APRESOLINE) 10 MG tablet Take 1-2 tablet 3 times a day as needed if blood pressure is >170 60 tablet 3  . levothyroxine (SYNTHROID) 150 MCG tablet TAKE 1 TABLET BY MOUTH EVERY DAY 90 tablet 3  . Multiple Vitamin (MULTIVITAMIN) capsule Take 1 capsule by mouth daily.    . Multiple Vitamins-Minerals (PRESERVISION AREDS 2) CAPS Take 1 capsule by mouth in the morning and at bedtime.    . nitroGLYCERIN (NITROSTAT) 0.4 MG SL tablet Place 0.4 mg under the tongue every 5 (five) minutes as needed for chest pain.    Marland Kitchen olmesartan (BENICAR) 5 MG tablet Take 1 tablet (5 mg total) by mouth daily. 30 tablet 11  . Propylene Glycol (SYSTANE BALANCE) 0.6 % SOLN Place 1 drop into both eyes daily as needed (Dry eye). systane balance    . simvastatin (ZOCOR) 10 MG tablet TAKE 1 TABLET BY MOUTH  EVERYDAY AT BEDTIME 90 tablet 3  . tamsulosin (FLOMAX) 0.4 MG CAPS capsule Take 0.4 mg by mouth.    . testosterone cypionate (DEPOTESTOSTERONE CYPIONATE) 200 MG/ML injection INJECT 2ML INTO MUSCLE EVERY 2 WEEKS 12 mL 0  . isosorbide mononitrate (IMDUR) 30 MG 24 hr tablet Take 1 tablet (30 mg total) by mouth daily. 90 tablet 3   No facility-administered medications prior to visit.    ROS: Review of Systems  Constitutional: Negative for appetite change, fatigue and unexpected weight change.  HENT: Negative for congestion, nosebleeds, sneezing, sore throat and trouble swallowing.   Eyes: Negative for itching and visual disturbance.  Respiratory: Negative for cough.   Cardiovascular: Negative for chest pain, palpitations and leg swelling.  Gastrointestinal: Negative for abdominal distention, blood in stool, diarrhea and nausea.  Genitourinary: Negative for frequency and hematuria.  Musculoskeletal: Positive for arthralgias and back pain. Negative for gait problem, joint swelling and neck pain.  Skin: Negative for rash.  Neurological: Negative for dizziness, tremors, speech difficulty and weakness.  Psychiatric/Behavioral: Negative for agitation, dysphoric mood and sleep disturbance. The patient is not nervous/anxious.     Objective:  BP (!) 162/80 (BP Location: Left Arm, Patient Position: Sitting, Cuff Size: Large)   Pulse  64   Temp 98.7 F (37.1 C) (Oral)   Ht 6' (1.829 m)   Wt 212 lb 3.2 oz (96.3 kg)   SpO2 98%   BMI 28.78 kg/m   BP Readings from Last 3 Encounters:  08/24/20 (!) 162/80  08/19/20 (!) 142/82  05/19/20 136/72    Wt Readings from Last 3 Encounters:  08/24/20 212 lb 3.2 oz (96.3 kg)  08/19/20 212 lb 12.8 oz (96.5 kg)  05/19/20 217 lb (98.4 kg)    Physical Exam Constitutional:      General: He is not in acute distress.    Appearance: He is well-developed.     Comments: NAD  Eyes:     Conjunctiva/sclera: Conjunctivae normal.     Pupils: Pupils are equal,  round, and reactive to light.  Neck:     Thyroid: No thyromegaly.     Vascular: No JVD.  Cardiovascular:     Rate and Rhythm: Normal rate and regular rhythm.     Heart sounds: Normal heart sounds. No murmur heard.  No friction rub. No gallop.   Pulmonary:     Effort: Pulmonary effort is normal. No respiratory distress.     Breath sounds: Normal breath sounds. No wheezing or rales.  Chest:     Chest wall: No tenderness.  Abdominal:     General: Bowel sounds are normal. There is no distension.     Palpations: Abdomen is soft. There is no mass.     Tenderness: There is no abdominal tenderness. There is no guarding or rebound.  Musculoskeletal:        General: No tenderness. Normal range of motion.     Cervical back: Normal range of motion.  Lymphadenopathy:     Cervical: No cervical adenopathy.  Skin:    General: Skin is warm and dry.     Findings: No rash.  Neurological:     Mental Status: He is alert and oriented to person, place, and time.     Cranial Nerves: No cranial nerve deficit.     Motor: No abnormal muscle tone.     Coordination: Coordination normal.     Gait: Gait normal.     Deep Tendon Reflexes: Reflexes are normal and symmetric.  Psychiatric:        Behavior: Behavior normal.        Thought Content: Thought content normal.        Judgment: Judgment normal.     Lab Results  Component Value Date   WBC 5.8 01/28/2020   HGB 13.3 01/31/2020   HCT 39.0 01/31/2020   PLT 72 (LL) 01/28/2020   GLUCOSE 115 (H) 04/01/2020   CHOL 115 02/18/2020   TRIG 115 02/18/2020   HDL 34 (L) 02/18/2020   LDLCALC 60 02/18/2020   ALT 21 02/18/2020   AST 28 02/18/2020   NA 142 04/01/2020   K 4.6 04/01/2020   CL 103 04/01/2020   CREATININE 2.06 (H) 04/01/2020   BUN 20 04/01/2020   CO2 25 04/01/2020   TSH 3.93 07/25/2019   PSA 2.64 04/24/2019   INR 1.20 11/26/2014   HGBA1C 5.9 03/15/2016   MICROALBUR 1.2 05/18/2017    No results found.  Assessment & Plan:    Walker Kehr, MD

## 2020-08-24 NOTE — Assessment & Plan Note (Signed)
On gluten free diet Labs

## 2020-08-24 NOTE — Assessment & Plan Note (Signed)
Clonazepam at hs qod  Potential benefits of a long term benzodiazepines  use as well as potential risks  and complications were explained to the patient and were aknowledged.

## 2020-08-24 NOTE — Assessment & Plan Note (Signed)
Labile BP No change in Rx

## 2020-09-18 ENCOUNTER — Encounter: Payer: Self-pay | Admitting: Gastroenterology

## 2020-09-21 ENCOUNTER — Telehealth: Payer: Self-pay

## 2020-09-21 NOTE — Telephone Encounter (Signed)
Called to schedule visit with Dr. Burt Knack (due 02/17/2021) with echo a couple days prior to visit.  Left message to call back.

## 2020-09-21 NOTE — Telephone Encounter (Signed)
Scheduled the patient for echo 02/25/2021 and visit with Dr. Burt Knack 03/01/21. The patient was grateful for call and agrees with plan.

## 2020-10-04 ENCOUNTER — Other Ambulatory Visit: Payer: Self-pay | Admitting: Internal Medicine

## 2020-10-05 DIAGNOSIS — H353121 Nonexudative age-related macular degeneration, left eye, early dry stage: Secondary | ICD-10-CM | POA: Diagnosis not present

## 2020-10-05 DIAGNOSIS — H353112 Nonexudative age-related macular degeneration, right eye, intermediate dry stage: Secondary | ICD-10-CM | POA: Diagnosis not present

## 2020-10-12 ENCOUNTER — Ambulatory Visit (INDEPENDENT_AMBULATORY_CARE_PROVIDER_SITE_OTHER): Payer: PPO | Admitting: Internal Medicine

## 2020-10-12 ENCOUNTER — Other Ambulatory Visit: Payer: Self-pay

## 2020-10-12 ENCOUNTER — Encounter: Payer: Self-pay | Admitting: Internal Medicine

## 2020-10-12 DIAGNOSIS — I10 Essential (primary) hypertension: Secondary | ICD-10-CM | POA: Diagnosis not present

## 2020-10-12 DIAGNOSIS — N1831 Chronic kidney disease, stage 3a: Secondary | ICD-10-CM | POA: Diagnosis not present

## 2020-10-12 DIAGNOSIS — M545 Low back pain, unspecified: Secondary | ICD-10-CM

## 2020-10-12 DIAGNOSIS — F411 Generalized anxiety disorder: Secondary | ICD-10-CM

## 2020-10-12 DIAGNOSIS — M5442 Lumbago with sciatica, left side: Secondary | ICD-10-CM

## 2020-10-12 DIAGNOSIS — G8929 Other chronic pain: Secondary | ICD-10-CM | POA: Diagnosis not present

## 2020-10-12 MED ORDER — METHYLPREDNISOLONE 4 MG PO TBPK: ORAL_TABLET | ORAL | 0 refills | Status: DC

## 2020-10-12 MED ORDER — TIZANIDINE HCL 4 MG PO TABS
2.0000 mg | ORAL_TABLET | Freq: Three times a day (TID) | ORAL | 1 refills | Status: DC | PRN
Start: 2020-10-12 — End: 2020-11-24

## 2020-10-12 MED ORDER — CLONAZEPAM 0.25 MG PO TBDP
0.2500 mg | ORAL_TABLET | Freq: Two times a day (BID) | ORAL | 3 refills | Status: DC | PRN
Start: 2020-10-12 — End: 2021-04-19

## 2020-10-12 MED ORDER — METHYLPREDNISOLONE ACETATE 80 MG/ML IJ SUSP
80.0000 mg | Freq: Once | INTRAMUSCULAR | Status: AC
  Administered 2020-10-12: 12:00:00 80 mg via INTRAMUSCULAR

## 2020-10-12 MED ORDER — HYDROCODONE-ACETAMINOPHEN 7.5-325 MG PO TABS
1.0000 | ORAL_TABLET | Freq: Four times a day (QID) | ORAL | 0 refills | Status: DC | PRN
Start: 2020-10-12 — End: 2020-12-14

## 2020-10-12 MED ORDER — TESTOSTERONE CYPIONATE 200 MG/ML IM SOLN: INTRAMUSCULAR | 1 refills | Status: DC

## 2020-10-12 NOTE — Assessment & Plan Note (Addendum)
LBP - worse L radiculopathy - worse Piriformis syndrome L Start Medrol pack Depo-medrol IM 80 mg Norco prn  Potential benefits of a short/long term opioids use as well as potential risks (i.e. addiction risk, apnea etc) and complications (i.e. Somnolence, constipation and others) were explained to the patient and were aknowledged.

## 2020-10-12 NOTE — Assessment & Plan Note (Addendum)
Klonopin prn-rare use Not to take w/Norco

## 2020-10-12 NOTE — Progress Notes (Signed)
Subjective:  Patient ID: Scott Oneal, male    DOB: Mar 09, 1941  Age: 79 y.o. MRN: [id number]  CC: Hip Pain (Left side, pt states pain goes down his leg)   HPI Scott Oneal presents for chronic leg pain on the left-worse, chronic renal insufficiency, anxiety follow-up  Outpatient Medications Prior to Visit  Medication Sig Dispense Refill  . Ascorbic Acid (VITAMIN C PO) Take 1 tablet by mouth every other day.    Scott Kitchen aspirin 81 MG tablet Take 81 mg by mouth every evening.     . B-D 3CC LUER-LOK SYR 21GX1" 21G X 1" 3 ML MISC USE EVERY 14 DAYS AS DIRECTED 50 each 2  . calcitRIOL (ROCALTROL) 0.25 MCG capsule TAKE 1 CAPSULE BY MOUTH EVERY DAY 90 capsule 1  . carvedilol (COREG) 25 MG tablet TAKE 1 TABLET BY MOUTH TWICE A DAY WITH MEALS 180 tablet 3  . cetirizine (ZYRTEC) 10 MG tablet Take 10 mg by mouth daily as needed for allergies.    . Cholecalciferol 2000 UNITS TABS Take 2,000 Units by mouth daily.     . diclofenac sodium (VOLTAREN) 1 % GEL APPLY 4 G TOPICALLY 4 TIMES DAILY. 200 g 5  . hydrALAZINE (APRESOLINE) 10 MG tablet Take 1-2 tablet 3 times a day as needed if blood pressure is >170 60 tablet 3  . levothyroxine (SYNTHROID) 150 MCG tablet TAKE 1 TABLET BY MOUTH EVERY DAY 90 tablet 3  . Multiple Vitamin (MULTIVITAMIN) capsule Take 1 capsule by mouth daily.    . Multiple Vitamins-Minerals (PRESERVISION AREDS 2) CAPS Take 1 capsule by mouth in the morning and at bedtime.    . nitroGLYCERIN (NITROSTAT) 0.4 MG SL tablet Place 0.4 mg under the tongue every 5 (five) minutes as needed for chest pain.    Scott Kitchen olmesartan (BENICAR) 5 MG tablet Take 1 tablet (5 mg total) by mouth daily. 30 tablet 11  . Propylene Glycol (SYSTANE BALANCE) 0.6 % SOLN Place 1 drop into both eyes daily as needed (Dry eye). systane balance    . simvastatin (ZOCOR) 10 MG tablet TAKE 1 TABLET BY MOUTH EVERYDAY AT BEDTIME 90 tablet 3  . tamsulosin (FLOMAX) 0.4 MG CAPS capsule Take 0.4 mg by mouth.    . clonazePAM (KLONOPIN)  0.25 MG disintegrating tablet TAKE 1 TABLET (0.25 MG TOTAL) BY MOUTH 2 (TWO) TIMES DAILY AS NEEDED (ANXIETY). 60 tablet 3  . testosterone cypionate (DEPOTESTOSTERONE CYPIONATE) 200 MG/ML injection INJECT 2ML INTO MUSCLE EVERY 2 WEEKS 12 mL 0  . isosorbide mononitrate (IMDUR) 30 MG 24 hr tablet Take 1 tablet (30 mg total) by mouth daily. 90 tablet 3   No facility-administered medications prior to visit.    ROS: Review of Systems  Constitutional: Positive for fatigue. Negative for appetite change and unexpected weight change.  HENT: Negative for congestion, nosebleeds, sneezing, sore throat and trouble swallowing.   Eyes: Negative for itching and visual disturbance.  Respiratory: Negative for cough.   Cardiovascular: Negative for chest pain, palpitations and leg swelling.  Gastrointestinal: Negative for abdominal distention, blood in stool, diarrhea and nausea.  Genitourinary: Negative for frequency and hematuria.  Musculoskeletal: Positive for arthralgias, back pain and gait problem. Negative for joint swelling and neck pain.  Skin: Negative for rash.  Neurological: Negative for dizziness, tremors, speech difficulty and weakness.  Psychiatric/Behavioral: Negative for agitation, dysphoric mood, sleep disturbance and suicidal ideas. The patient is nervous/anxious.     Objective:  BP 132/70 (BP Location: Left Arm)   Pulse Scott Kitchen)  57   Temp 98.4 F (36.9 C) (Oral)   Wt 214 lb (97.1 kg)   SpO2 97%   BMI 29.02 kg/m   BP Readings from Last 3 Encounters:  10/12/20 132/70  08/24/20 (!) 162/80  08/19/20 (!) 142/82    Wt Readings from Last 3 Encounters:  10/12/20 214 lb (97.1 kg)  08/24/20 212 lb 3.2 oz (96.3 kg)  08/19/20 212 lb 12.8 oz (96.5 kg)    Physical Exam Constitutional:      General: He is not in acute distress.    Appearance: He is well-developed.     Comments: NAD  HENT:     Mouth/Throat:     Mouth: Oropharynx is clear and moist.  Eyes:     Conjunctiva/sclera:  Conjunctivae normal.     Pupils: Pupils are equal, round, and reactive to light.  Neck:     Thyroid: No thyromegaly.     Vascular: No JVD.  Cardiovascular:     Rate and Rhythm: Normal rate and regular rhythm.     Pulses: Intact distal pulses.     Heart sounds: Normal heart sounds. No murmur heard. No friction rub. No gallop.   Pulmonary:     Effort: Pulmonary effort is normal. No respiratory distress.     Breath sounds: Normal breath sounds. No wheezing or rales.  Chest:     Chest wall: No tenderness.  Abdominal:     General: Bowel sounds are normal. There is no distension.     Palpations: Abdomen is soft. There is no mass.     Tenderness: There is no abdominal tenderness. There is no guarding or rebound.  Musculoskeletal:        General: Tenderness present. No edema. Normal range of motion.     Cervical back: Normal range of motion.  Lymphadenopathy:     Cervical: No cervical adenopathy.  Skin:    General: Skin is warm and dry.     Findings: No rash.  Neurological:     Mental Status: He is alert and oriented to person, place, and time.     Cranial Nerves: No cranial nerve deficit.     Motor: No abnormal muscle tone.     Coordination: He displays a negative Romberg sign. Coordination normal.     Gait: Gait normal.     Deep Tendon Reflexes: Reflexes are normal and symmetric.  Psychiatric:        Mood and Affect: Mood and affect normal.        Behavior: Behavior normal.        Thought Content: Thought content normal.        Judgment: Judgment normal.    Lumbar spine and hip joints are stiff Straight leg elevation is AP focal on the left  Lab Results  Component Value Date   WBC 5.8 01/28/2020   HGB 13.3 01/31/2020   HCT 39.0 01/31/2020   PLT 72 (LL) 01/28/2020   GLUCOSE 77 08/24/2020   CHOL 115 02/18/2020   TRIG 115 02/18/2020   HDL 34 (L) 02/18/2020   LDLCALC 60 02/18/2020   ALT 23 08/24/2020   AST 24 08/24/2020   NA 140 08/24/2020   K 4.8 08/24/2020   CL 101  08/24/2020   CREATININE 2.00 (H) 08/24/2020   BUN 17 08/24/2020   CO2 33 (H) 08/24/2020   TSH 3.84 08/24/2020   PSA 2.08 08/24/2020   INR 1.20 11/26/2014   HGBA1C 5.9 03/15/2016   MICROALBUR 1.2 05/18/2017    No  results found.  Assessment & Plan:   Scott Oneal was seen today for hip pain.  Diagnoses and all orders for this visit:  Chronic left-sided low back pain with left-sided sciatica -     methylPREDNISolone acetate (DEPO-MEDROL) injection 80 mg  Stage 3a chronic kidney disease (HCC)  Generalized anxiety disorder  Other orders -     methylPREDNISolone (MEDROL DOSEPAK) 4 MG TBPK tablet; As directed -     HYDROcodone-acetaminophen (NORCO) 7.5-325 MG tablet; Take 1 tablet by mouth every 6 (six) hours as needed for moderate pain. -     tiZANidine (ZANAFLEX) 4 MG tablet; Take 0.5-1 tablets (2-4 mg total) by mouth every 8 (eight) hours as needed for muscle spasms. -     clonazePAM (KLONOPIN) 0.25 MG disintegrating tablet; Take 1 tablet (0.25 mg total) by mouth 2 (two) times daily as needed (anxiety). -     testosterone cypionate (DEPOTESTOSTERONE CYPIONATE) 200 MG/ML injection; INJECT 2ML INTO MUSCLE EVERY 2 WEEKS     Meds ordered this encounter  Medications  . methylPREDNISolone (MEDROL DOSEPAK) 4 MG TBPK tablet    Sig: As directed    Dispense:  21 tablet    Refill:  0  . HYDROcodone-acetaminophen (NORCO) 7.5-325 MG tablet    Sig: Take 1 tablet by mouth every 6 (six) hours as needed for moderate pain.    Dispense:  20 tablet    Refill:  0  . tiZANidine (ZANAFLEX) 4 MG tablet    Sig: Take 0.5-1 tablets (2-4 mg total) by mouth every 8 (eight) hours as needed for muscle spasms.    Dispense:  60 tablet    Refill:  1  . clonazePAM (KLONOPIN) 0.25 MG disintegrating tablet    Sig: Take 1 tablet (0.25 mg total) by mouth 2 (two) times daily as needed (anxiety).    Dispense:  60 tablet    Refill:  3    This request is for a new prescription for a controlled substance as required  by Federal/State law..  . testosterone cypionate (DEPOTESTOSTERONE CYPIONATE) 200 MG/ML injection    Sig: INJECT 2ML INTO MUSCLE EVERY 2 WEEKS    Dispense:  12 mL    Refill:  1    Not to exceed 4 additional fills before 05/15/2020  . methylPREDNISolone acetate (DEPO-MEDROL) injection 80 mg     Follow-up: Return in about 6 weeks (around 11/23/2020) for a follow-up visit.  Walker Kehr, MD

## 2020-10-12 NOTE — Assessment & Plan Note (Addendum)
GFR-31.  No NSAIDs.  Good hydration.  Regular nephrology follow-up visits with labs

## 2020-10-12 NOTE — Patient Instructions (Addendum)
   B-complex with Niacin 100 mg    Lion's mane    Hip opener exercises -- [email or web address]

## 2020-10-12 NOTE — Progress Notes (Signed)
Subjective:  Patient ID: Scott Oneal, male    DOB: 12-19-1940  Age: 79 y.o. MRN: [id number]  CC: Hip Pain (Left side, pt states pain goes down his leg)   HPI [name] presents for severe LBP/ L hip pain, LLE pain down to the ankle after collecting leaves x 1 month - 10/10 at times Tramadol did not help...   Outpatient Medications Prior to Visit  Medication Sig Dispense Refill  . Ascorbic Acid (VITAMIN C PO) Take 1 tablet by mouth every other day.    Marland Kitchen aspirin 81 MG tablet Take 81 mg by mouth every evening.     . B-D 3CC LUER-LOK SYR 21GX1" 21G X 1" 3 ML MISC USE EVERY 14 DAYS AS DIRECTED 50 each 2  . calcitRIOL (ROCALTROL) 0.25 MCG capsule TAKE 1 CAPSULE BY MOUTH EVERY DAY 90 capsule 1  . carvedilol (COREG) 25 MG tablet TAKE 1 TABLET BY MOUTH TWICE A DAY WITH MEALS 180 tablet 3  . cetirizine (ZYRTEC) 10 MG tablet Take 10 mg by mouth daily as needed for allergies.    . Cholecalciferol 2000 UNITS TABS Take 2,000 Units by mouth daily.     . clonazePAM (KLONOPIN) 0.25 MG disintegrating tablet TAKE 1 TABLET (0.25 MG TOTAL) BY MOUTH 2 (TWO) TIMES DAILY AS NEEDED (ANXIETY). 60 tablet 3  . diclofenac sodium (VOLTAREN) 1 % GEL APPLY 4 G TOPICALLY 4 TIMES DAILY. 200 g 5  . hydrALAZINE (APRESOLINE) 10 MG tablet Take 1-2 tablet 3 times a day as needed if blood pressure is >170 60 tablet 3  . levothyroxine (SYNTHROID) 150 MCG tablet TAKE 1 TABLET BY MOUTH EVERY DAY 90 tablet 3  . Multiple Vitamin (MULTIVITAMIN) capsule Take 1 capsule by mouth daily.    . Multiple Vitamins-Minerals (PRESERVISION AREDS 2) CAPS Take 1 capsule by mouth in the morning and at bedtime.    . nitroGLYCERIN (NITROSTAT) 0.4 MG SL tablet Place 0.4 mg under the tongue every 5 (five) minutes as needed for chest pain.    Marland Kitchen olmesartan (BENICAR) 5 MG tablet Take 1 tablet (5 mg total) by mouth daily. 30 tablet 11  . Propylene Glycol (SYSTANE BALANCE) 0.6 % SOLN Place 1 drop into both eyes daily as needed (Dry eye). systane  balance    . simvastatin (ZOCOR) 10 MG tablet TAKE 1 TABLET BY MOUTH EVERYDAY AT BEDTIME 90 tablet 3  . tamsulosin (FLOMAX) 0.4 MG CAPS capsule Take 0.4 mg by mouth.    . testosterone cypionate (DEPOTESTOSTERONE CYPIONATE) 200 MG/ML injection INJECT 2ML INTO MUSCLE EVERY 2 WEEKS 12 mL 0  . isosorbide mononitrate (IMDUR) 30 MG 24 hr tablet Take 1 tablet (30 mg total) by mouth daily. 90 tablet 3   No facility-administered medications prior to visit.    ROS: Review of Systems  Constitutional: Negative for appetite change, fatigue and unexpected weight change.  HENT: Negative for congestion, nosebleeds, sneezing, sore throat and trouble swallowing.   Eyes: Negative for itching and visual disturbance.  Respiratory: Negative for cough.   Cardiovascular: Negative for chest pain, palpitations and leg swelling.  Gastrointestinal: Negative for abdominal distention, blood in stool, diarrhea and nausea.  Genitourinary: Negative for frequency and hematuria.  Musculoskeletal: Positive for back pain and gait problem. Negative for joint swelling and neck pain.  Skin: Negative for rash.  Neurological: Negative for dizziness, tremors, speech difficulty and weakness.  Psychiatric/Behavioral: Negative for agitation, dysphoric mood, sleep disturbance and suicidal ideas. The patient is not nervous/anxious.  Objective:  BP 132/70 (BP Location: Left Arm)   Pulse (!) 57   Temp 98.4 F (36.9 C) (Oral)   Wt 214 lb (97.1 kg)   SpO2 97%   BMI 29.02 kg/m   BP Readings from Last 3 Encounters:  10/12/20 132/70  08/24/20 (!) 162/80  08/19/20 (!) 142/82    Wt Readings from Last 3 Encounters:  10/12/20 214 lb (97.1 kg)  08/24/20 212 lb 3.2 oz (96.3 kg)  08/19/20 212 lb 12.8 oz (96.5 kg)    Physical Exam Constitutional:      General: He is not in acute distress.    Appearance: He is well-developed. He is obese.     Comments: NAD  HENT:     Mouth/Throat:     Mouth: Oropharynx is clear and moist.   Eyes:     Conjunctiva/sclera: Conjunctivae normal.     Pupils: Pupils are equal, round, and reactive to light.  Neck:     Thyroid: No thyromegaly.     Vascular: No JVD.  Cardiovascular:     Rate and Rhythm: Normal rate and regular rhythm.     Pulses: Intact distal pulses.     Heart sounds: Normal heart sounds. No murmur heard. No friction rub. No gallop.   Pulmonary:     Effort: Pulmonary effort is normal. No respiratory distress.     Breath sounds: Normal breath sounds. No wheezing or rales.  Chest:     Chest wall: No tenderness.  Abdominal:     General: Bowel sounds are normal. There is no distension.     Palpations: Abdomen is soft. There is no mass.     Tenderness: There is no abdominal tenderness. There is no guarding or rebound.  Musculoskeletal:        General: No tenderness or edema. Normal range of motion.     Cervical back: Normal range of motion.  Lymphadenopathy:     Cervical: No cervical adenopathy.  Skin:    General: Skin is warm and dry.     Findings: No rash.  Neurological:     Mental Status: He is alert and oriented to person, place, and time.     Cranial Nerves: No cranial nerve deficit.     Motor: No abnormal muscle tone.     Coordination: He displays a negative Romberg sign. Coordination normal.     Gait: Gait normal.     Deep Tendon Reflexes: Reflexes are normal and symmetric.  Psychiatric:        Mood and Affect: Mood and affect normal.        Behavior: Behavior normal.        Thought Content: Thought content normal.        Judgment: Judgment normal.    L buttock - tender Str leg elev +/- L  Lab Results  Component Value Date   WBC 5.8 01/28/2020   HGB 13.3 01/31/2020   HCT 39.0 01/31/2020   PLT 72 (LL) 01/28/2020   GLUCOSE 77 08/24/2020   CHOL 115 02/18/2020   TRIG 115 02/18/2020   HDL 34 (L) 02/18/2020   LDLCALC 60 02/18/2020   ALT 23 08/24/2020   AST 24 08/24/2020   NA 140 08/24/2020   K 4.8 08/24/2020   CL 101 08/24/2020    CREATININE 2.00 (H) 08/24/2020   BUN 17 08/24/2020   CO2 33 (H) 08/24/2020   TSH 3.84 08/24/2020   PSA 2.08 08/24/2020   INR 1.20 11/26/2014   HGBA1C 5.9 03/15/2016  MICROALBUR 1.2 05/18/2017    No results found.  Assessment & Plan:    Walker Kehr, MD

## 2020-10-18 NOTE — Assessment & Plan Note (Signed)
Continue with hydralazine 10 mg 1-2 3 times a day, carvedilol 25 mg twice a day

## 2020-11-13 ENCOUNTER — Other Ambulatory Visit: Payer: Self-pay | Admitting: Gastroenterology

## 2020-11-13 ENCOUNTER — Other Ambulatory Visit: Payer: Self-pay

## 2020-11-13 ENCOUNTER — Ambulatory Visit (AMBULATORY_SURGERY_CENTER): Payer: PPO | Admitting: *Deleted

## 2020-11-13 ENCOUNTER — Telehealth: Payer: Self-pay | Admitting: Gastroenterology

## 2020-11-13 VITALS — Ht 72.0 in | Wt 212.0 lb

## 2020-11-13 DIAGNOSIS — Z800 Family history of malignant neoplasm of digestive organs: Secondary | ICD-10-CM

## 2020-11-13 DIAGNOSIS — Z86010 Personal history of colonic polyps: Secondary | ICD-10-CM

## 2020-11-13 MED ORDER — PLENVU 140 G PO SOLR: 1.0000 | Freq: Once | ORAL | 0 refills | Status: DC

## 2020-11-13 MED ORDER — PLENVU 140 G PO SOLR: 1.0000 | Freq: Once | ORAL | 0 refills | Status: AC

## 2020-11-13 NOTE — Telephone Encounter (Signed)
Spoke with the pt's pharmacy. Resent RX per pharmacy request. They will fill the Plenvu RX pt will bring in coupon.  Patient notified.

## 2020-11-13 NOTE — Telephone Encounter (Signed)
Pt called to inform that he received a text msg from his pharmacy stating that Plenvu cannot be filled. Pt is not sure of what this means but stated that if [facility] does not carry Plenvu, please try send it to [facility] at the [address], phone is [phone].

## 2020-11-13 NOTE — Progress Notes (Signed)
Patient's pre-visit was done today over the phone with the patient due to COVID-19 pandemic. Name,DOB and address verified. Insurance verified. Packet of Prep instructions mailed to patient including a copy of a consent form and pre-procedure patient acknowledgement form (with envelope to mail back to us)-pt is aware.  Patient understands to call us back with any questions or concerns. COVID-19 vaccines completed on 07/2020 x3, per patient. Notified patient of our Covid-19 safety protocols. Plenvu medicare coupon activated and mailed to pt with packet-pt aware to use coupon and cost will be $60.

## 2020-11-20 ENCOUNTER — Telehealth: Payer: Self-pay | Admitting: *Deleted

## 2020-11-20 ENCOUNTER — Encounter: Payer: Self-pay | Admitting: Gastroenterology

## 2020-11-20 DIAGNOSIS — Z86010 Personal history of colonic polyps: Secondary | ICD-10-CM

## 2020-11-20 DIAGNOSIS — Z800 Family history of malignant neoplasm of digestive organs: Secondary | ICD-10-CM

## 2020-11-20 MED ORDER — PLENVU 140 G PO SOLR: 1.0000 | ORAL | 0 refills | Status: DC

## 2020-11-20 NOTE — Telephone Encounter (Signed)
Pt at [place]- states Plenvu is not there- I resent the [facility]- it shows on med rec it went through to the Pharmacy- pt aware

## 2020-11-23 ENCOUNTER — Other Ambulatory Visit: Payer: Self-pay

## 2020-11-24 ENCOUNTER — Encounter: Payer: Self-pay | Admitting: Internal Medicine

## 2020-11-24 ENCOUNTER — Ambulatory Visit (INDEPENDENT_AMBULATORY_CARE_PROVIDER_SITE_OTHER): Payer: PPO | Admitting: Internal Medicine

## 2020-11-24 DIAGNOSIS — E034 Atrophy of thyroid (acquired): Secondary | ICD-10-CM

## 2020-11-24 DIAGNOSIS — I10 Essential (primary) hypertension: Secondary | ICD-10-CM

## 2020-11-24 DIAGNOSIS — M25559 Pain in unspecified hip: Secondary | ICD-10-CM

## 2020-11-24 DIAGNOSIS — K900 Celiac disease: Secondary | ICD-10-CM

## 2020-11-24 DIAGNOSIS — I2510 Atherosclerotic heart disease of native coronary artery without angina pectoris: Secondary | ICD-10-CM | POA: Diagnosis not present

## 2020-11-24 MED ORDER — "BD ECLIPSE SYRINGE 23G X 1"" 3 ML MISC": 2 refills | Status: DC

## 2020-11-24 NOTE — Assessment & Plan Note (Signed)
Continue w/Coreg, Hydralazine, Benicar for BP Simvastatin

## 2020-11-24 NOTE — Assessment & Plan Note (Signed)
Continue w/Coreg, Hydralazine, Benicar

## 2020-11-24 NOTE — Assessment & Plan Note (Signed)
Better after inj and w/stretching Norco - rare use  Potential benefits of a long term opioids use as well as potential risks (i.e. addiction risk, apnea etc) and complications (i.e. Somnolence, constipation and others) were explained to the patient and were aknowledged.

## 2020-11-24 NOTE — Assessment & Plan Note (Signed)
On gluten free diet

## 2020-11-24 NOTE — Progress Notes (Signed)
Subjective:  Patient ID: Scott Oneal, male    DOB: 03/30/1941  Age: 80 y.o. MRN: [id number]  CC: Follow-up (3 month f/u)   HPI Rafi W Rehberg presents for anxiety, DM, dyslipidemia f/u  Outpatient Medications Prior to Visit  Medication Sig Dispense Refill  . Ascorbic Acid (VITAMIN C PO) Take 1 tablet by mouth every other day.    Marland Kitchen aspirin 81 MG tablet Take 81 mg by mouth every evening.     . B Complex Vitamins (B COMPLEX PO) Take by mouth. Take 1 tablet three times a day    . B-D 3CC LUER-LOK SYR 21GX1" 21G X 1" 3 ML MISC USE EVERY 14 DAYS AS DIRECTED 50 each 2  . calcitRIOL (ROCALTROL) 0.25 MCG capsule TAKE 1 CAPSULE BY MOUTH EVERY DAY 90 capsule 1  . carvedilol (COREG) 25 MG tablet TAKE 1 TABLET BY MOUTH TWICE A DAY WITH MEALS 180 tablet 3  . cetirizine (ZYRTEC) 10 MG tablet Take 10 mg by mouth daily as needed for allergies.    . Cholecalciferol 2000 UNITS TABS Take 2,000 Units by mouth daily.     . clonazePAM (KLONOPIN) 0.25 MG disintegrating tablet Take 1 tablet (0.25 mg total) by mouth 2 (two) times daily as needed (anxiety). 60 tablet 3  . diclofenac sodium (VOLTAREN) 1 % GEL APPLY 4 G TOPICALLY 4 TIMES DAILY. 200 g 5  . hydrALAZINE (APRESOLINE) 10 MG tablet Take 1-2 tablet 3 times a day as needed if blood pressure is >170 60 tablet 3  . HYDROcodone-acetaminophen (NORCO) 7.5-325 MG tablet Take 1 tablet by mouth every 6 (six) hours as needed for moderate pain. 20 tablet 0  . isosorbide mononitrate (IMDUR) 30 MG 24 hr tablet Take 1 tablet (30 mg total) by mouth daily. 90 tablet 3  . levothyroxine (SYNTHROID) 150 MCG tablet TAKE 1 TABLET BY MOUTH EVERY DAY 90 tablet 3  . methylPREDNISolone (MEDROL DOSEPAK) 4 MG TBPK tablet As directed 21 tablet 0  . Multiple Vitamin (MULTIVITAMIN) capsule Take 1 capsule by mouth daily.    . Multiple Vitamins-Minerals (PRESERVISION AREDS 2) CAPS Take 1 capsule by mouth in the morning and at bedtime.    . nitroGLYCERIN (NITROSTAT) 0.4 MG SL tablet  Place 0.4 mg under the tongue every 5 (five) minutes as needed for chest pain.    Marland Kitchen olmesartan (BENICAR) 5 MG tablet Take 1 tablet (5 mg total) by mouth daily. 30 tablet 11  . PEG-KCl-NaCl-NaSulf-Na Asc-C (PLENVU) 140 g SOLR Take 1 kit by mouth as directed. 1 each 0  . Propylene Glycol (SYSTANE BALANCE) 0.6 % SOLN Place 1 drop into both eyes daily as needed (Dry eye). systane balance    . simvastatin (ZOCOR) 10 MG tablet TAKE 1 TABLET BY MOUTH EVERYDAY AT BEDTIME 90 tablet 3  . tamsulosin (FLOMAX) 0.4 MG CAPS capsule Take 0.4 mg by mouth.    . testosterone cypionate (DEPOTESTOSTERONE CYPIONATE) 200 MG/ML injection INJECT 2ML INTO MUSCLE EVERY 2 WEEKS 12 mL 1  . tiZANidine (ZANAFLEX) 4 MG tablet Take 0.5-1 tablets (2-4 mg total) by mouth every 8 (eight) hours as needed for muscle spasms. 60 tablet 1  . TURMERIC PO Take 1 capsule by mouth 3 (three) times daily.    Marland Kitchen UNABLE TO FIND Med Name: [facility] take 2 tablets by mouth daily     No facility-administered medications prior to visit.    ROS: Review of Systems  Constitutional: Positive for fatigue. Negative for appetite change and unexpected weight  change.  HENT: Negative for congestion, nosebleeds, sneezing, sore throat and trouble swallowing.   Eyes: Negative for itching and visual disturbance.  Respiratory: Negative for cough.   Cardiovascular: Negative for chest pain, palpitations and leg swelling.  Gastrointestinal: Negative for abdominal distention, blood in stool, diarrhea and nausea.  Genitourinary: Negative for frequency and hematuria.  Musculoskeletal: Positive for arthralgias and back pain. Negative for gait problem, joint swelling and neck pain.  Skin: Negative for rash.  Neurological: Positive for numbness. Negative for dizziness, tremors, speech difficulty and weakness.  Psychiatric/Behavioral: Negative for agitation, dysphoric mood and sleep disturbance. The patient is not nervous/anxious.     Objective:  BP 134/80 (BP  Location: Left Arm)   Pulse 63   Temp 98.1 F (36.7 C) (Oral)   Ht 6' (1.829 m)   Wt 212 lb 12.8 oz (96.5 kg)   SpO2 97%   BMI 28.86 kg/m   BP Readings from Last 3 Encounters:  11/24/20 134/80  10/12/20 132/70  08/24/20 (!) 162/80    Wt Readings from Last 3 Encounters:  11/24/20 212 lb 12.8 oz (96.5 kg)  11/13/20 212 lb (96.2 kg)  10/12/20 214 lb (97.1 kg)    Physical Exam Constitutional:      General: He is not in acute distress.    Appearance: He is well-developed and well-nourished. He is not diaphoretic.  HENT:     Head: Normocephalic and atraumatic.     Right Ear: External ear normal.     Left Ear: External ear normal.     Nose: Nose normal.     Mouth/Throat:     Mouth: Oropharynx is clear and moist.     Pharynx: No oropharyngeal exudate.  Eyes:     General: No scleral icterus.       Right eye: No discharge.        Left eye: No discharge.     Extraocular Movements: EOM normal.     Conjunctiva/sclera: Conjunctivae normal.     Pupils: Pupils are equal, round, and reactive to light.  Neck:     Thyroid: No thyromegaly.     Vascular: No JVD.     Trachea: No tracheal deviation.  Cardiovascular:     Rate and Rhythm: Normal rate. Rhythm irregular.     Pulses: Intact distal pulses.     Heart sounds: Normal heart sounds. No murmur heard. No friction rub. No gallop.   Pulmonary:     Effort: Pulmonary effort is normal. No respiratory distress.     Breath sounds: Normal breath sounds. No stridor. No wheezing or rales.  Chest:     Chest wall: No tenderness.  Abdominal:     General: Bowel sounds are normal. There is no distension.     Palpations: Abdomen is soft. There is no mass.     Tenderness: There is no abdominal tenderness. There is no guarding or rebound.  Genitourinary:    Penis: Normal. No tenderness.      Prostate: Normal.     Rectum: Normal. Guaiac result negative.  Musculoskeletal:        General: No tenderness or edema. Normal range of motion.      Cervical back: Normal range of motion and neck supple.  Lymphadenopathy:     Cervical: No cervical adenopathy.  Skin:    General: Skin is warm and dry.     Coloration: Skin is not pale.     Findings: No erythema or rash.  Neurological:     Mental Status:  He is alert and oriented to person, place, and time.     Cranial Nerves: No cranial nerve deficit.     Motor: No abnormal muscle tone.     Coordination: Coordination normal.     Deep Tendon Reflexes: Reflexes are normal and symmetric. Reflexes normal.  Psychiatric:        Mood and Affect: Mood and affect normal.        Behavior: Behavior normal.        Thought Content: Thought content normal.        Judgment: Judgment normal.    irreg beats Lab Results  Component Value Date   WBC 5.8 01/28/2020   HGB 13.3 01/31/2020   HCT 39.0 01/31/2020   PLT 72 (LL) 01/28/2020   GLUCOSE 77 08/24/2020   CHOL 115 02/18/2020   TRIG 115 02/18/2020   HDL 34 (L) 02/18/2020   LDLCALC 60 02/18/2020   ALT 23 08/24/2020   AST 24 08/24/2020   NA 140 08/24/2020   K 4.8 08/24/2020   CL 101 08/24/2020   CREATININE 2.00 (H) 08/24/2020   BUN 17 08/24/2020   CO2 33 (H) 08/24/2020   TSH 3.84 08/24/2020   PSA 2.08 08/24/2020   INR 1.20 11/26/2014   HGBA1C 5.9 03/15/2016   MICROALBUR 1.2 05/18/2017    No results found.  Assessment & Plan:    Walker Kehr, MD

## 2020-11-24 NOTE — Assessment & Plan Note (Signed)
On Levothroid

## 2020-11-24 NOTE — Patient Instructions (Signed)
   B-complex with Niacin 100 mg    Lion's mane

## 2020-11-27 ENCOUNTER — Encounter: Payer: Self-pay | Admitting: Gastroenterology

## 2020-11-27 ENCOUNTER — Other Ambulatory Visit: Payer: Self-pay

## 2020-11-27 ENCOUNTER — Ambulatory Visit (AMBULATORY_SURGERY_CENTER): Payer: PPO | Admitting: Gastroenterology

## 2020-11-27 VITALS — BP 136/78 | HR 73 | Temp 97.5°F | Resp 13 | Ht 72.0 in | Wt 212.0 lb

## 2020-11-27 DIAGNOSIS — Z86010 Personal history of colonic polyps: Secondary | ICD-10-CM | POA: Diagnosis not present

## 2020-11-27 DIAGNOSIS — K635 Polyp of colon: Secondary | ICD-10-CM

## 2020-11-27 DIAGNOSIS — Z800 Family history of malignant neoplasm of digestive organs: Secondary | ICD-10-CM

## 2020-11-27 DIAGNOSIS — D123 Benign neoplasm of transverse colon: Secondary | ICD-10-CM

## 2020-11-27 MED ORDER — SODIUM CHLORIDE 0.9 % IV SOLN: 500.0000 mL | Freq: Once | INTRAVENOUS | Status: DC

## 2020-11-27 NOTE — Op Note (Signed)
[place] Patient Name: Scott Oneal Procedure Date: 11/27/2020 11:14 AM MRN: [id number] Endoscopist: Ladene Artist , MD Age: 80 Referring MD:  Date of Birth: 09-23-1941 Gender: Male Account #: [id number] Procedure:                Colonoscopy Indications:              Surveillance: Personal history of adenomatous                            polyps on last colonoscopy > 3 years ago. Family                            history of Scott cancer. Medicines:                Monitored Anesthesia Care Procedure:                Pre-Anesthesia Assessment:                           - Prior to the procedure, a History and Physical                            was performed, and patient medications and                            allergies were reviewed. The patient's tolerance of                            previous anesthesia was also reviewed. The risks                            and benefits of the procedure and the sedation                            options and risks were discussed with the patient.                            All questions were answered, and informed consent                            was obtained. Prior Anticoagulants: The patient has                            taken no previous anticoagulant or antiplatelet                            agents. ASA Grade Assessment: III - A patient with                            severe systemic disease. After reviewing the risks                            and benefits, the patient was deemed in  satisfactory condition to undergo the procedure.                           After obtaining informed consent, the colonoscope                            was passed under direct vision. Throughout the                            procedure, the patient's blood pressure, pulse, and                            oxygen saturations were monitored continuously. The                            Olympus [id number]) [id number]  was introduced                            through the anus and advanced to the the cecum,                            identified by appendiceal orifice and ileocecal                            valve. The ileocecal valve, appendiceal orifice,                            and rectum were photographed. The quality of the                            bowel preparation was good. The colonoscopy was                            performed without difficulty. The patient tolerated                            the procedure well. Scope In: 11:23:50 AM Scope Out: 11:43:24 AM Scope Withdrawal Time: 0 hours 17 minutes 44 seconds  Total Procedure Duration: 0 hours 19 minutes 34 seconds  Findings:                 The perianal and digital rectal examinations were                            normal.                           A 6 mm polyp was found in the transverse Scott. The                            polyp was sessile. The polyp was removed with a                            cold snare. Resection and retrieval were complete.  Persistent oozing at polypectomy site. For                            hemostasis, two hemostatic clips were successfully                            placed (MR conditional). There was no bleeding at                            the end of the maneuver.                           Internal hemorrhoids were found during                            retroflexion. The hemorrhoids were small and Grade                            I (internal hemorrhoids that do not prolapse).                           The exam was otherwise without abnormality on                            direct and retroflexion views. Complications:            No immediate complications. Estimated blood loss:                            None. Estimated Blood Loss:     Estimated blood loss: none. Impression:               - One 6 mm polyp in the transverse Scott, removed                            with a cold  snare. Resected and retrieved. Clips                            (MR conditional) were placed.                           - Internal hemorrhoids.                           - The examination was otherwise normal on direct                            and retroflexion views. Recommendation:           - Patient has a contact number available for                            emergencies. The signs and symptoms of potential                            delayed complications were discussed with the  patient. Return to normal activities tomorrow.                            Written discharge instructions were provided to the                            patient.                           - Resume previous diet.                           - Continue present medications.                           - Await pathology results.                           - Hold ASA/NSAIDs for 7 days then ok to resume.                           - No repeat colonoscopy due to comorbidites and age. Ladene Artist, MD 11/27/2020 11:51:09 AM This report has been signed electronically.

## 2020-11-27 NOTE — Progress Notes (Signed)
Called to room to assist during endoscopic procedure.  Patient ID and intended procedure confirmed with present staff. Received instructions for my participation in the procedure from the performing physician.  

## 2020-11-27 NOTE — Patient Instructions (Signed)
Handouts Provided:  Polyps and Clip Card  YOU HAD AN ENDOSCOPIC PROCEDURE TODAY AT [facility]:   Refer to the procedure report that was given to you for any specific questions about what was found during the examination.  If the procedure report does not answer your questions, please call your gastroenterologist to clarify.  If you requested that your care partner not be given the details of your procedure findings, then the procedure report has been included in a sealed envelope for you to review at your convenience later.  YOU SHOULD EXPECT: Some feelings of bloating in the abdomen. Passage of more gas than usual.  Walking can help get rid of the air that was put into your GI tract during the procedure and reduce the bloating. If you had a lower endoscopy (such as a colonoscopy or flexible sigmoidoscopy) you may notice spotting of blood in your stool or on the toilet paper. If you underwent a bowel prep for your procedure, you may not have a normal bowel movement for a few days.  Please Note:  You might notice some irritation and congestion in your nose or some drainage.  This is from the oxygen used during your procedure.  There is no need for concern and it should clear up in a day or so.  SYMPTOMS TO REPORT IMMEDIATELY:   Following lower endoscopy (colonoscopy or flexible sigmoidoscopy):  Excessive amounts of blood in the stool  Significant tenderness or worsening of abdominal pains  Swelling of the abdomen that is new, acute  Fever of 100F or higher  For urgent or emergent issues, a gastroenterologist can be reached at any hour by calling [phone]. Do not use MyChart messaging for urgent concerns.    DIET:  We do recommend a small meal at first, but then you may proceed to your regular diet.  Drink plenty of fluids but you should avoid alcoholic beverages for 24 hours.  ACTIVITY:  You should plan to take it easy for the rest of today and you should NOT DRIVE or  use heavy machinery until tomorrow (because of the sedation medicines used during the test).    FOLLOW UP: Our staff will call the number listed on your records 48-72 hours following your procedure to check on you and address any questions or concerns that you may have regarding the information given to you following your procedure. If we do not reach you, we will leave a message.  We will attempt to reach you two times.  During this call, we will ask if you have developed any symptoms of COVID 19. If you develop any symptoms (ie: fever, flu-like symptoms, shortness of breath, cough etc.) before then, please call [phone].  If you test positive for Covid 19 in the 2 weeks post procedure, please call and report this information to [place].    If any biopsies were taken you will be contacted by phone or by letter within the next 1-3 weeks.  Please call us at [phone] if you have not heard about the biopsies in 3 weeks.    SIGNATURES/CONFIDENTIALITY: You and/or your care partner have signed paperwork which will be entered into your electronic medical record.  These signatures attest to the fact that that the information above on your After Visit Summary has been reviewed and is understood.  Full responsibility of the confidentiality of this discharge information lies with you and/or your care-partner.

## 2020-11-27 NOTE — Progress Notes (Signed)
To PACU, VSS. Report to [email or web address] 

## 2020-11-27 NOTE — Progress Notes (Signed)
Pt's states no medical or surgical changes since previsit or office visit. 

## 2020-12-01 ENCOUNTER — Telehealth: Payer: Self-pay | Admitting: *Deleted

## 2020-12-01 NOTE — Telephone Encounter (Signed)
  Follow up Call-  Call back number 11/27/2020  Post procedure Call Back phone  # [phone]  Permission to leave phone message Yes  Some recent data might be hidden     Patient questions:  Do you have a fever, pain , or abdominal swelling? No. Pain Score  0 *  Have you tolerated food without any problems? Yes.    Have you been able to return to your normal activities? Yes.    Do you have any questions about your discharge instructions: Diet   No. Medications  No. Follow up visit  No.  Do you have questions or concerns about your Care? No.  Actions: * If pain score is 4 or above: No action needed, pain <4.  1. Have you developed a fever since your procedure? no  2.   Have you had an respiratory symptoms (SOB or cough) since your procedure? no  3.   Have you tested positive for COVID 19 since your procedure no  4.   Have you had any family members/close contacts diagnosed with the COVID 19 since your procedure?  no   If yes to any of these questions please route to Joylene John, RN and Joella Prince, RN

## 2020-12-01 NOTE — Telephone Encounter (Signed)
Attempted f/u phone call. No answer. Left message. °

## 2020-12-09 DIAGNOSIS — N200 Calculus of kidney: Secondary | ICD-10-CM | POA: Diagnosis not present

## 2020-12-14 ENCOUNTER — Other Ambulatory Visit: Payer: Self-pay

## 2020-12-14 ENCOUNTER — Ambulatory Visit (INDEPENDENT_AMBULATORY_CARE_PROVIDER_SITE_OTHER): Payer: PPO | Admitting: Family

## 2020-12-14 ENCOUNTER — Ambulatory Visit (INDEPENDENT_AMBULATORY_CARE_PROVIDER_SITE_OTHER): Payer: PPO

## 2020-12-14 VITALS — BP 146/78 | HR 66 | Temp 98.0°F | Ht 72.0 in | Wt 209.0 lb

## 2020-12-14 DIAGNOSIS — G8929 Other chronic pain: Secondary | ICD-10-CM

## 2020-12-14 DIAGNOSIS — M5442 Lumbago with sciatica, left side: Secondary | ICD-10-CM

## 2020-12-14 DIAGNOSIS — M545 Low back pain, unspecified: Secondary | ICD-10-CM | POA: Diagnosis not present

## 2020-12-14 MED ORDER — HYDROCODONE-ACETAMINOPHEN 7.5-325 MG PO TABS: 1.0000 | ORAL_TABLET | Freq: Four times a day (QID) | ORAL | 0 refills | Status: DC | PRN

## 2020-12-14 MED ORDER — METHYLPREDNISOLONE ACETATE 40 MG/ML IJ SUSP
40.0000 mg | Freq: Once | INTRAMUSCULAR | Status: AC
Start: 2020-12-14 — End: 2020-12-14
  Administered 2020-12-14: 40 mg via INTRAMUSCULAR

## 2020-12-14 NOTE — Addendum Note (Signed)
Addended by: Marcina Millard on: 12/14/2020 01:19 PM   Modules accepted: Orders

## 2020-12-14 NOTE — Progress Notes (Signed)
Scott Oneal is a 80 y.o. male with the following history as recorded in [employer]:  Patient Active Problem List   Diagnosis Date Noted  . Diastolic dysfunction 24/40/1027  . Anginal equivalent ([place])   . Jaw pain 01/08/2020  . Hip pain 10/10/2019  . Night sweats 07/25/2019  . Hand pain, right 04/24/2019  . Coccyx pain 11/13/2018  . Hip bursitis, left 09/10/2018  . Sciatic pain, left 09/10/2018  . Peroneal neuropathy at knee, left 07/09/2018  . CKD (chronic kidney disease) stage 3, GFR 30-59 ml/min (HCC) 05/14/2018  . Macular degeneration 04/26/2018  . Bilateral buttock pain 11/29/2017  . Cough 09/15/2017  . Bursitis of hip, right 07/18/2017  . Ischial bursitis 05/17/2017  . Asthmatic bronchitis 05/17/2017  . Elbow pain, left 11/21/2016  . Irregular heart beat 09/27/2016  . Hiccups 07/14/2016  . Edema 06/06/2016  . Insomnia 06/06/2016  . Thigh pain 03/15/2016  . Tinnitus 09/15/2015  . Cramps of lower extremity 09/15/2015  . UTI (urinary tract infection) 06/15/2015  . Umbilical hernia 25/36/6440  . Lower GI bleed 11/26/2014  . Rectal bleeding 11/25/2014  . History of colonic polyps 09/12/2014  . Left foot pain 08/11/2014  . Right foot pain 06/10/2014  . Oral candidiasis 05/15/2014  . Rash 04/30/2014  . Low back pain 01/29/2014  . Anxiety disorder 11/27/2013  . Arthralgia 09/30/2013  . Preop exam for internal medicine 06/19/2013  . Preoperative clearance 06/19/2013  . Urinary frequency 12/12/2012  . Actinic keratoses 12/12/2012  . Thrombocytopenia ([place]) 11/19/2012  . Well adult exam 09/10/2012  . Shoulder pain, left 09/10/2012  . Warts 09/10/2012  . Headache 11/29/2011  . Hyperkalemia 08/01/2011  . Neuropathy of hand 08/01/2011  . Numbness 04/28/2011  . Fatigue 04/28/2011  . Cellulitis of trunk 04/21/2011  . Chalazion 07/07/2010  . Disorder resulting from impaired renal function 03/10/2010  . Neoplasm of uncertain behavior of skin 11/09/2009  . PSA, INCREASED  11/09/2009  . HYPERCHOLESTEROLEMIA 08/31/2009  . Hypogonadism in male 07/03/2008  . BRADYCARDIA 07/03/2008  . PERSONAL HX COLONIC POLYPS 07/02/2008  . OSTEOPOROSIS 06/03/2008  . CHOLELITHIASIS 04/29/2008  . CALCU GALLBLADD W/O MENTION CHOLECYST/OBST 04/29/2008  . Celiac disease 04/29/2008  . WEIGHT LOSS 04/08/2008  . GERD (gastroesophageal reflux disease) 04/02/2008  . TUBULOVILLOUS ADENOMA, COLON 04/01/2008  . Moderate persistent asthma without complication 34/74/2595  . Diarrhea 01/31/2008  . Hypothyroidism 08/11/2007  . Gout 08/11/2007  . Essential hypertension 08/11/2007  . MYOCARDIAL INFARCTION, HX OF 08/11/2007  . CAD (coronary artery disease) 08/11/2007    Current Outpatient Medications  Medication Sig Dispense Refill  . Ascorbic Acid (VITAMIN C PO) Take 1 tablet by mouth every other day.    Marland Kitchen aspirin 81 MG tablet Take 81 mg by mouth every evening.     . B Complex Vitamins (B COMPLEX PO) Take by mouth. Take 1 tablet three times a day    . calcitRIOL (ROCALTROL) 0.25 MCG capsule TAKE 1 CAPSULE BY MOUTH EVERY DAY 90 capsule 1  . carvedilol (COREG) 25 MG tablet TAKE 1 TABLET BY MOUTH TWICE A DAY WITH MEALS 180 tablet 3  . cetirizine (ZYRTEC) 10 MG tablet Take 10 mg by mouth daily as needed for allergies.    . Cholecalciferol 2000 UNITS TABS Take 2,000 Units by mouth daily.     . clonazePAM (KLONOPIN) 0.25 MG disintegrating tablet Take 1 tablet (0.25 mg total) by mouth 2 (two) times daily as needed (anxiety). 60 tablet 3  . diclofenac sodium (VOLTAREN) 1 %  GEL APPLY 4 G TOPICALLY 4 TIMES DAILY. 200 g 5  . levothyroxine (SYNTHROID) 150 MCG tablet TAKE 1 TABLET BY MOUTH EVERY DAY 90 tablet 3  . Multiple Vitamin (MULTIVITAMIN) capsule Take 1 capsule by mouth daily.    . nitroGLYCERIN (NITROSTAT) 0.4 MG SL tablet Place 0.4 mg under the tongue every 5 (five) minutes as needed for chest pain.    Marland Kitchen olmesartan (BENICAR) 5 MG tablet Take 1 tablet (5 mg total) by mouth daily. 30 tablet 11   . Propylene Glycol (SYSTANE BALANCE) 0.6 % SOLN Place 1 drop into both eyes daily as needed (Dry eye). systane balance    . simvastatin (ZOCOR) 10 MG tablet TAKE 1 TABLET BY MOUTH EVERYDAY AT BEDTIME 90 tablet 3  . tamsulosin (FLOMAX) 0.4 MG CAPS capsule Take 0.4 mg by mouth.    . testosterone cypionate (DEPOTESTOSTERONE CYPIONATE) 200 MG/ML injection INJECT 2ML INTO MUSCLE EVERY 2 WEEKS 12 mL 1  . TURMERIC PO Take 1 capsule by mouth 3 (three) times daily.    Marland Kitchen UNABLE TO FIND Med Name: [facility] take 2 tablets by mouth daily    . hydrALAZINE (APRESOLINE) 10 MG tablet Take 1-2 tablet 3 times a day as needed if blood pressure is >170 (Patient not taking: No sig reported) 60 tablet 3  . HYDROcodone-acetaminophen (NORCO) 7.5-325 MG tablet Take 1 tablet by mouth every 6 (six) hours as needed for moderate pain. 20 tablet 0  . isosorbide mononitrate (IMDUR) 30 MG 24 hr tablet Take 1 tablet (30 mg total) by mouth daily. 90 tablet 3  . Multiple Vitamins-Minerals (PRESERVISION AREDS 2) CAPS Take 1 capsule by mouth in the morning and at bedtime. (Patient not taking: Reported on 12/14/2020)    . SYRINGE-NEEDLE, DISP, 3 ML (BD ECLIPSE SYRINGE) 23G X 1" 3 ML MISC As directed IM (Patient not taking: No sig reported) 50 each 2   No current facility-administered medications for this visit.    Allergies: Biaxin [clarithromycin], Tizanidine, Allantoin-pramoxine, Amlodipine, Benzalkonium chloride, and Metoprolol tartrate  Past Medical History:  Diagnosis Date  . Allergy   . Anxiety   . Arthritis   . Asthma   . Benign neoplasm of colon   . Calculus of gallbladder without mention of cholecystitis   . Cataract    bilateral cateracts removed  . Celiac disease   . Cholelithiasis   . Coronary atherosclerosis of unspecified type of vessel, native or graft   . Diarrhea   . Esophageal reflux   . Glaucoma   . Gout, unspecified   . Hyperparathyroidism   . Long term (current) use of anticoagulants   . Loss of  weight   . Old myocardial infarction   . Osteoporosis, unspecified   . Other malaise and fatigue   . Other specified cardiac dysrhythmias(427.89)   . Other testicular hypofunction   . Personal history of colonic polyps   . Pure hypercholesterolemia   . Renal insufficiency    chronic  . Unspecified asthma(493.90)   . Unspecified essential hypertension   . Unspecified hypothyroidism     Past Surgical History:  Procedure Laterality Date  . cataract surgery Bilateral   . COLONOSCOPY  06/26/2017   [facility]  . COLONOSCOPY WITH PROPOFOL N/A 11/27/2014   Procedure: COLONOSCOPY WITH PROPOFOL;  Surgeon: Milus Banister, MD;  Location: [place];  Service: Endoscopy;  Laterality: N/A;  . CORONARY ANGIOPLASTY    . CORONARY STENT PLACEMENT  2011   Cypher; in distal circumflex artery  .  CYSTOSCOPY  1998  . EYE SURGERY    . KNEE SURGERY     right  . LEFT HEART CATH AND CORONARY ANGIOGRAPHY N/A 01/31/2020   Procedure: LEFT HEART CATH AND CORONARY ANGIOGRAPHY;  Surgeon: Belva Crome, MD;  Location: [facility];  Service: Cardiovascular;  Laterality: N/A;  . SHOULDER SURGERY Left 9/14   Dr Shara Blazing  . UPPER GASTROINTESTINAL ENDOSCOPY    . VASCULAR SURGERY      Family History  Problem Relation Age of Onset  . Lymphoma Father   . Hypertension Other   . Vision loss Maternal Grandmother   . Colon cancer Son 49  . Asthma Neg Hx   . Esophageal cancer Neg Hx   . Rectal cancer Neg Hx   . Stomach cancer Neg Hx   . Colon polyps Neg Hx     Social History   Tobacco Use  . Smoking status: Never Smoker  . Smokeless tobacco: Never Used  Substance Use Topics  . Alcohol use: No    Subjective:  Chronic low back pain; feels that symptoms are worsening; radiating pain into his left leg; no change in bowel or bladder habits;    Objective:  Vitals:   12/14/20 1110  BP: (!) 146/78  Pulse: 66  Temp: 98 F (36.7 C)  TempSrc: Oral  SpO2: 98%  Weight: 209 lb (94.8 kg)  Height: 6' (1.829 m)     General: Well developed, well nourished, in no acute distress  Skin : Warm and dry.  Head: Normocephalic and atraumatic  Lungs: Respirations unlabored; Musculoskeletal: No deformities; no active joint inflammation  Extremities: No edema, cyanosis, clubbing  Vessels: Symmetric bilaterally  Neurologic: Alert and oriented; speech intact; face symmetrical; moves all extremities well; CNII-XII intact without focal deficit   Assessment:  1. Chronic left-sided low back pain with left-sided sciatica     Plan:  Update lumbar X-ray; will need to consider lumbar MRI; Rx for Norco refilled- last given by his PCP in December; Depo-Medrol IM 40 mg;  This visit occurred during the SARS-CoV-2 public health emergency.  Safety protocols were in place, including screening questions prior to the visit, additional usage of staff PPE, and extensive cleaning of exam room while observing appropriate contact time as indicated for disinfecting solutions.     No follow-ups on file.  Orders Placed This Encounter  Procedures  . DG Lumbar Spine Complete    Standing Status:   Future    Number of Occurrences:   1    Standing Expiration Date:   12/14/2021    Order Specific Question:   Reason for Exam (SYMPTOM  OR DIAGNOSIS REQUIRED)    Answer:   low back pain    Order Specific Question:   Preferred imaging location?    Answer:   [facility]    Requested Prescriptions   Signed Prescriptions Disp Refills  . HYDROcodone-acetaminophen (NORCO) 7.5-325 MG tablet 20 tablet 0    Sig: Take 1 tablet by mouth every 6 (six) hours as needed for moderate pain.

## 2020-12-15 ENCOUNTER — Telehealth: Payer: Self-pay | Admitting: Gastroenterology

## 2020-12-15 ENCOUNTER — Telehealth: Payer: Self-pay

## 2020-12-15 ENCOUNTER — Encounter: Payer: Self-pay | Admitting: Gastroenterology

## 2020-12-15 DIAGNOSIS — M545 Low back pain, unspecified: Secondary | ICD-10-CM

## 2020-12-15 NOTE — Telephone Encounter (Signed)
Bilateral nephrolithiasis. No evidence of stone projecting of the course of the ureter in the pelvis on this lumbar spine exam. Consider renal imaging if there is concern for renal colic as cause for back pain.

## 2020-12-15 NOTE — Telephone Encounter (Signed)
Patient advised that the hemo clips are MRI compatible.  He will call back for any additional questions or concerns.

## 2020-12-15 NOTE — Telephone Encounter (Signed)
He told me yesterday that he had just seen his urologist and they were aware of kidney stones and were monitoring. There are some very large stones so please verify that I understood him correctly and he has plans to see urology in follow-up.  I also think we need to get the MRI of his back as discussed- his symptoms yesterday were more consistent with the arthritis noted on the X-ray; that order has been placed.

## 2020-12-15 NOTE — Addendum Note (Signed)
Addended by: Sherlene Shams on: 12/15/2020 09:52 AM   Modules accepted: Orders

## 2020-12-15 NOTE — Telephone Encounter (Signed)
Pt states he had a visit with his urologist recently & the plan from that visit was he is going to urinate in a jug for 24hrs once the jug has been sent to him & f/u with his urologist once the results come back. He verb understanding of the MRI referral & has no further ques/concerns.

## 2020-12-19 ENCOUNTER — Other Ambulatory Visit: Payer: Self-pay | Admitting: Internal Medicine

## 2020-12-19 MED ORDER — METHYLPREDNISOLONE 4 MG PO TBPK: ORAL_TABLET | ORAL | 0 refills | Status: DC

## 2020-12-24 DIAGNOSIS — N200 Calculus of kidney: Secondary | ICD-10-CM | POA: Diagnosis not present

## 2020-12-27 ENCOUNTER — Other Ambulatory Visit: Payer: Self-pay | Admitting: Cardiology

## 2020-12-27 ENCOUNTER — Other Ambulatory Visit: Payer: Self-pay | Admitting: Internal Medicine

## 2020-12-28 ENCOUNTER — Other Ambulatory Visit: Payer: Self-pay | Admitting: Internal Medicine

## 2021-01-05 ENCOUNTER — Ambulatory Visit
Admission: RE | Admit: 2021-01-05 | Discharge: 2021-01-05 | Disposition: A | Discharge status: Home / Self Care | Payer: PPO | Source: Ambulatory Visit | Attending: Family | Admitting: Family

## 2021-01-05 ENCOUNTER — Other Ambulatory Visit: Payer: Self-pay

## 2021-01-05 DIAGNOSIS — M545 Low back pain, unspecified: Secondary | ICD-10-CM

## 2021-01-06 ENCOUNTER — Encounter: Payer: Self-pay | Admitting: Family

## 2021-01-06 ENCOUNTER — Ambulatory Visit (INDEPENDENT_AMBULATORY_CARE_PROVIDER_SITE_OTHER): Payer: PPO

## 2021-01-06 ENCOUNTER — Other Ambulatory Visit: Payer: Self-pay | Admitting: Family

## 2021-01-06 DIAGNOSIS — R937 Abnormal findings on diagnostic imaging of other parts of musculoskeletal system: Secondary | ICD-10-CM

## 2021-01-06 DIAGNOSIS — M545 Low back pain, unspecified: Secondary | ICD-10-CM | POA: Diagnosis not present

## 2021-01-06 MED ORDER — METHYLPREDNISOLONE ACETATE 40 MG/ML IJ SUSP
40.0000 mg | Freq: Once | INTRAMUSCULAR | Status: AC
  Administered 2021-01-06: 40 mg via INTRAMUSCULAR

## 2021-01-06 NOTE — Progress Notes (Signed)
Depo 40 given for back pain.

## 2021-01-12 DIAGNOSIS — M48062 Spinal stenosis, lumbar region with neurogenic claudication: Secondary | ICD-10-CM | POA: Diagnosis not present

## 2021-01-12 DIAGNOSIS — M7062 Trochanteric bursitis, left hip: Secondary | ICD-10-CM | POA: Diagnosis not present

## 2021-01-20 DIAGNOSIS — M48062 Spinal stenosis, lumbar region with neurogenic claudication: Secondary | ICD-10-CM | POA: Diagnosis not present

## 2021-01-20 DIAGNOSIS — M7062 Trochanteric bursitis, left hip: Secondary | ICD-10-CM | POA: Diagnosis not present

## 2021-01-20 DIAGNOSIS — M7918 Myalgia, other site: Secondary | ICD-10-CM | POA: Diagnosis not present

## 2021-02-02 DIAGNOSIS — N183 Chronic kidney disease, stage 3 unspecified: Secondary | ICD-10-CM | POA: Diagnosis not present

## 2021-02-02 DIAGNOSIS — I129 Hypertensive chronic kidney disease with stage 1 through stage 4 chronic kidney disease, or unspecified chronic kidney disease: Secondary | ICD-10-CM | POA: Diagnosis not present

## 2021-02-02 DIAGNOSIS — Z6841 Body Mass Index (BMI) 40.0 and over, adult: Secondary | ICD-10-CM | POA: Diagnosis not present

## 2021-02-02 DIAGNOSIS — N320 Bladder-neck obstruction: Secondary | ICD-10-CM | POA: Diagnosis not present

## 2021-02-02 DIAGNOSIS — R319 Hematuria, unspecified: Secondary | ICD-10-CM | POA: Diagnosis not present

## 2021-02-08 ENCOUNTER — Other Ambulatory Visit: Payer: Self-pay | Admitting: Internal Medicine

## 2021-02-23 ENCOUNTER — Other Ambulatory Visit: Payer: Self-pay

## 2021-02-24 ENCOUNTER — Ambulatory Visit (INDEPENDENT_AMBULATORY_CARE_PROVIDER_SITE_OTHER): Payer: PPO | Admitting: Internal Medicine

## 2021-02-24 ENCOUNTER — Encounter: Payer: Self-pay | Admitting: Internal Medicine

## 2021-02-24 VITALS — BP 144/86 | HR 74 | Temp 98.7°F | Ht 72.0 in | Wt 188.0 lb

## 2021-02-24 DIAGNOSIS — K900 Celiac disease: Secondary | ICD-10-CM | POA: Diagnosis not present

## 2021-02-24 DIAGNOSIS — R197 Diarrhea, unspecified: Secondary | ICD-10-CM | POA: Diagnosis not present

## 2021-02-24 DIAGNOSIS — E860 Dehydration: Secondary | ICD-10-CM | POA: Diagnosis not present

## 2021-02-24 DIAGNOSIS — I10 Essential (primary) hypertension: Secondary | ICD-10-CM

## 2021-02-24 LAB — COMPREHENSIVE METABOLIC PANEL
ALT: 30 U/L (ref 0–53)
AST: 28 U/L (ref 0–37)
Albumin: 4.3 g/dL (ref 3.5–5.2)
Alkaline Phosphatase: 56 U/L (ref 39–117)
BUN: 19 mg/dL (ref 6–23)
CO2: 26 mEq/L (ref 19–32)
Calcium: 9.7 mg/dL (ref 8.4–10.5)
Chloride: 105 mEq/L (ref 96–112)
Creatinine, Ser: 2.07 mg/dL — ABNORMAL HIGH (ref 0.40–1.50)
GFR: 29.92 mL/min — ABNORMAL LOW (ref >60.00)
Glucose, Bld: 91 mg/dL (ref 70–99)
Potassium: 4.8 mEq/L (ref 3.5–5.1)
Sodium: 141 mEq/L (ref 135–145)
Total Bilirubin: 1.1 mg/dL (ref 0.2–1.2)
Total Protein: 6.9 g/dL (ref 6.0–8.3)

## 2021-02-24 LAB — CBC WITH DIFFERENTIAL/PLATELET
Basophils Absolute: 0 10*3/uL (ref 0.0–0.1)
Basophils Relative: 0.8 % (ref 0.0–3.0)
Eosinophils Absolute: 0.1 10*3/uL (ref 0.0–0.7)
Eosinophils Relative: 1.8 % (ref 0.0–5.0)
HCT: 51.7 % (ref 39.0–52.0)
Hemoglobin: 17.1 g/dL — ABNORMAL HIGH (ref 13.0–17.0)
Lymphocytes Relative: 16.5 % (ref 12.0–46.0)
Lymphs Abs: 1 10*3/uL (ref 0.7–4.0)
MCHC: 33.1 g/dL (ref 30.0–36.0)
MCV: 93.6 fl (ref 78.0–100.0)
Monocytes Absolute: 0.5 10*3/uL (ref 0.1–1.0)
Monocytes Relative: 8 % (ref 3.0–12.0)
Neutro Abs: 4.5 10*3/uL (ref 1.4–7.7)
Neutrophils Relative %: 72.9 % (ref 43.0–77.0)
Platelets: 100 10*3/uL — ABNORMAL LOW (ref 150.0–400.0)
RBC: 5.52 Mil/uL (ref 4.22–5.81)
RDW: 16.5 % — ABNORMAL HIGH (ref 11.5–15.5)
WBC: 6.2 10*3/uL (ref 4.0–10.5)

## 2021-02-24 LAB — SEDIMENTATION RATE: Sed Rate: 9 mm/hr (ref 0–20)

## 2021-02-24 MED ORDER — DIPHENOXYLATE-ATROPINE 2.5-0.025 MG PO TABS: 1.0000 | ORAL_TABLET | Freq: Four times a day (QID) | ORAL | 1 refills | Status: DC | PRN

## 2021-02-24 NOTE — Assessment & Plan Note (Signed)
On a strict gluten free diet

## 2021-02-24 NOTE — Patient Instructions (Addendum)
Get Liquid I.V. Hydration Multiplier - Lemon Lime - Hydration Powder Packets or Pedialite  Go to ER if not better

## 2021-02-24 NOTE — Assessment & Plan Note (Signed)
Obtain stool tests incl C diff test Lomotil prn

## 2021-02-24 NOTE — Assessment & Plan Note (Signed)
CMET Oral IV or Pedialite

## 2021-02-24 NOTE — Progress Notes (Signed)
Subjective:  Patient ID: Scott Oneal, male    DOB: 09/14/1941  Age: 80 y.o. MRN: [id number]  CC: Diarrhea (Pt states he is not able to keep anything in.. soon as he eats start having diarrhea. sxs started week ago. Also concern about the weight loss)   HPI Scott Oneal presents for diarrhea x 10 d (6-10 stools a day) loose, watery No n/v Pt lost wt   Outpatient Medications Prior to Visit  Medication Sig Dispense Refill  . Ascorbic Acid (VITAMIN C PO) Take 1 tablet by mouth every other day.    Marland Kitchen aspirin 81 MG tablet Take 81 mg by mouth every evening.     . B Complex Vitamins (B COMPLEX PO) Take by mouth. Take 1 tablet three times a day    . calcitRIOL (ROCALTROL) 0.25 MCG capsule TAKE 1 CAPSULE BY MOUTH EVERY DAY 90 capsule 1  . carvedilol (COREG) 25 MG tablet TAKE 1 TABLET BY MOUTH TWICE A DAY WITH MEALS 180 tablet 3  . cetirizine (ZYRTEC) 10 MG tablet Take 10 mg by mouth daily as needed for allergies.    . Cholecalciferol 2000 UNITS TABS Take 2,000 Units by mouth daily.     . clonazePAM (KLONOPIN) 0.25 MG disintegrating tablet Take 1 tablet (0.25 mg total) by mouth 2 (two) times daily as needed (anxiety). 60 tablet 3  . diclofenac sodium (VOLTAREN) 1 % GEL APPLY 4 G TOPICALLY 4 TIMES DAILY. 200 g 5  . hydrALAZINE (APRESOLINE) 10 MG tablet Take 1-2 tablet 3 times a day as needed if blood pressure is >170 60 tablet 3  . isosorbide mononitrate (IMDUR) 30 MG 24 hr tablet TAKE 1 TABLET BY MOUTH EVERY DAY 90 tablet 2  . levothyroxine (SYNTHROID) 150 MCG tablet TAKE 1 TABLET BY MOUTH EVERY DAY 90 tablet 1  . Multiple Vitamin (MULTIVITAMIN) capsule Take 1 capsule by mouth daily.    . Multiple Vitamins-Minerals (PRESERVISION AREDS 2) CAPS Take 1 capsule by mouth in the morning and at bedtime.    . nitroGLYCERIN (NITROSTAT) 0.4 MG SL tablet Place 0.4 mg under the tongue every 5 (five) minutes as needed for chest pain.    Marland Kitchen olmesartan (BENICAR) 5 MG tablet Take 1 tablet (5 mg total) by mouth  daily. 30 tablet 11  . Propylene Glycol (SYSTANE BALANCE) 0.6 % SOLN Place 1 drop into both eyes daily as needed (Dry eye). systane balance    . simvastatin (ZOCOR) 10 MG tablet TAKE 1 TABLET BY MOUTH EVERYDAY AT BEDTIME 90 tablet 3  . SYRINGE-NEEDLE, DISP, 3 ML (BD ECLIPSE SYRINGE) 23G X 1" 3 ML MISC As directed IM 50 each 2  . tamsulosin (FLOMAX) 0.4 MG CAPS capsule Take 0.4 mg by mouth.    . testosterone cypionate (DEPOTESTOSTERONE CYPIONATE) 200 MG/ML injection INJECT 2ML INTO MUSCLE EVERY 2 WEEKS 12 mL 1  . TURMERIC PO Take 1 capsule by mouth 3 (three) times daily.    Marland Kitchen UNABLE TO FIND Med Name: [facility] take 2 tablets by mouth daily    . HYDROcodone-acetaminophen (NORCO) 7.5-325 MG tablet Take 1 tablet by mouth every 6 (six) hours as needed for moderate pain. (Patient not taking: Reported on 02/24/2021) 20 tablet 0  . methylPREDNISolone (MEDROL DOSEPAK) 4 MG TBPK tablet As directed (Patient not taking: Reported on 02/24/2021) 21 tablet 0   No facility-administered medications prior to visit.    ROS: Review of Systems  Constitutional: Positive for fatigue. Negative for appetite change and unexpected weight  change.  HENT: Negative for congestion, nosebleeds, sneezing, sore throat and trouble swallowing.   Eyes: Negative for itching and visual disturbance.  Respiratory: Negative for cough.   Cardiovascular: Negative for chest pain, palpitations and leg swelling.  Gastrointestinal: Positive for diarrhea. Negative for abdominal distention, abdominal pain, blood in stool, nausea and rectal pain.  Genitourinary: Negative for frequency and hematuria.  Musculoskeletal: Negative for back pain, gait problem, joint swelling and neck pain.  Skin: Negative for rash.  Neurological: Positive for weakness. Negative for dizziness, tremors and speech difficulty.  Psychiatric/Behavioral: Negative for agitation, dysphoric mood and sleep disturbance. The patient is not nervous/anxious.     Objective:   BP (!) 144/86 (BP Location: Left Arm)   Pulse 74   Temp 98.7 F (37.1 C) (Oral)   Ht 6' (1.829 m)   Wt 188 lb (85.3 kg)   SpO2 96%   BMI 25.50 kg/m   BP Readings from Last 3 Encounters:  02/24/21 (!) 144/86  12/14/20 (!) 146/78  11/27/20 136/78    Wt Readings from Last 3 Encounters:  02/24/21 188 lb (85.3 kg)  12/14/20 209 lb (94.8 kg)  11/27/20 212 lb (96.2 kg)    Physical Exam Constitutional:      General: He is not in acute distress.    Appearance: He is well-developed. He is not toxic-appearing.     Comments: NAD  Eyes:     Conjunctiva/sclera: Conjunctivae normal.     Pupils: Pupils are equal, round, and reactive to light.  Neck:     Thyroid: No thyromegaly.     Vascular: No JVD.  Cardiovascular:     Rate and Rhythm: Normal rate and regular rhythm.     Heart sounds: Normal heart sounds. No murmur heard. No friction rub. No gallop.   Pulmonary:     Effort: Pulmonary effort is normal. No respiratory distress.     Breath sounds: Normal breath sounds. No wheezing or rales.  Chest:     Chest wall: No tenderness.  Abdominal:     General: Bowel sounds are normal. There is no distension.     Palpations: Abdomen is soft. There is no mass.     Tenderness: There is no abdominal tenderness. There is no guarding or rebound.  Musculoskeletal:        General: No tenderness. Normal range of motion.     Cervical back: Normal range of motion.  Lymphadenopathy:     Cervical: No cervical adenopathy.  Skin:    General: Skin is warm and dry.     Findings: No rash.  Neurological:     Mental Status: He is alert and oriented to person, place, and time.     Cranial Nerves: No cranial nerve deficit.     Motor: No abnormal muscle tone.     Coordination: Coordination normal.     Gait: Gait normal.     Deep Tendon Reflexes: Reflexes are normal and symmetric.  Psychiatric:        Behavior: Behavior normal.        Thought Content: Thought content normal.        Judgment:  Judgment normal.   pt looks tired umb hernia  Lab Results  Component Value Date   WBC 5.8 01/28/2020   HGB 13.3 01/31/2020   HCT 39.0 01/31/2020   PLT 72 (LL) 01/28/2020   GLUCOSE 77 08/24/2020   CHOL 115 02/18/2020   TRIG 115 02/18/2020   HDL 34 (L) 02/18/2020   LDLCALC 60 02/18/2020  ALT 23 08/24/2020   AST 24 08/24/2020   NA 140 08/24/2020   K 4.8 08/24/2020   CL 101 08/24/2020   CREATININE 2.00 (H) 08/24/2020   BUN 17 08/24/2020   CO2 33 (H) 08/24/2020   TSH 3.84 08/24/2020   PSA 2.08 08/24/2020   INR 1.20 11/26/2014   HGBA1C 5.9 03/15/2016   MICROALBUR 1.2 05/18/2017    MR Lumbar Spine Wo Contrast  Result Date: 01/05/2021 CLINICAL DATA:  Low back pain, > 6 wks Low back pain, progressive neurologic deficit EXAM: MRI LUMBAR SPINE WITHOUT CONTRAST TECHNIQUE: Multiplanar, multisequence MR imaging of the lumbar spine was performed. No intravenous contrast was administered. COMPARISON:  12/14/2020 and prior FINDINGS: Segmentation:  Standard. Alignment:  Stepwise grade 1 retrolisthesis at the L2-4 levels. Vertebrae: Bone marrow heterogeneity. Scattered hyperintensities may reflect hemangiomata versus focal fat. Modic type 2 endplate degenerative changes. No fracture or aggressive osseous lesion. Minimal degenerative related bone marrow edema involving the facet joints at the L3-4 levels. Conus medullaris and cauda equina: Conus extends to the L1 level. Conus and cauda equina appear normal. Disc levels: Multilevel desiccation, disc space loss and Schmorl's node formation most prominent at the L3-4 level. L1-2: Minimal disc bulge and bilateral facet hypertrophy. Patent spinal canal and neural foramen. L2-3: Disc bulge and bilateral facet hypertrophy. Mild spinal canal and moderate bilateral neural foraminal narrowing. L3-4: Disc bulge with superimposed right foraminal and left subarticular protrusions. Ligamentum flavum thickening and bilateral facet hypertrophy. Moderate spinal canal  and severe bilateral neural foraminal narrowing. L4-5: Disc bulge with superimposed right greater than left foraminal protrusions. Ligamentum flavum thickening and bilateral facet hypertrophy. Small central protrusion. Severe spinal canal and bilateral neural foraminal narrowing. L5-S1: Disc bulge with small central protrusion and bilateral facet hypertrophy. Patent spinal canal. Mild right and moderate to severe left neural foraminal narrowing. Paraspinal and other soft tissues: Bilateral renal cysts. IMPRESSION: Moderate to severe spinal canal and bilateral neural foraminal narrowing at the L3-4, L4-5 levels. Moderate to severe bilateral L2-3 and left L5-S1 neural foraminal narrowing. Electronically Signed   By: Primitivo Gauze M.D.   On: 01/05/2021 16:46    Assessment & Plan:    Walker Kehr, MD

## 2021-02-24 NOTE — Addendum Note (Signed)
Addended by: Boris Lown B on: 02/24/2021 11:36 AM   Modules accepted: Orders

## 2021-02-24 NOTE — Assessment & Plan Note (Signed)
Hold Benicar for a few days

## 2021-02-25 ENCOUNTER — Other Ambulatory Visit (HOSPITAL_COMMUNITY): Payer: PPO

## 2021-02-25 DIAGNOSIS — R197 Diarrhea, unspecified: Secondary | ICD-10-CM | POA: Diagnosis not present

## 2021-02-25 NOTE — Addendum Note (Signed)
Addended by: Boris Lown B on: 02/25/2021 03:07 PM   Modules accepted: Orders

## 2021-02-26 LAB — GIARDIA AND CRYPTOSPORIDIUM ANTIGEN PANEL: Result:: NOT DETECTED

## 2021-02-26 LAB — FECAL LACTOFERRIN, QUANT: LACTOFERRIN, QL, STOOL: POSITIVE — AB

## 2021-02-27 LAB — SPECIMEN STATUS REPORT

## 2021-02-27 LAB — CLOSTRIDIUM DIFFICILE EIA: C difficile Toxins A+B, EIA: NEGATIVE

## 2021-03-01 ENCOUNTER — Ambulatory Visit: Payer: PPO | Admitting: Cardiovascular Disease

## 2021-03-03 ENCOUNTER — Ambulatory Visit (INDEPENDENT_AMBULATORY_CARE_PROVIDER_SITE_OTHER): Payer: PPO | Admitting: Internal Medicine

## 2021-03-03 ENCOUNTER — Encounter: Payer: Self-pay | Admitting: Internal Medicine

## 2021-03-03 ENCOUNTER — Other Ambulatory Visit: Payer: Self-pay

## 2021-03-03 DIAGNOSIS — I10 Essential (primary) hypertension: Secondary | ICD-10-CM

## 2021-03-03 DIAGNOSIS — N1832 Chronic kidney disease, stage 3b: Secondary | ICD-10-CM | POA: Diagnosis not present

## 2021-03-03 DIAGNOSIS — R197 Diarrhea, unspecified: Secondary | ICD-10-CM

## 2021-03-03 LAB — BASIC METABOLIC PANEL
BUN: 52 mg/dL — ABNORMAL HIGH (ref 6–23)
CO2: 26 mEq/L (ref 19–32)
Calcium: 8.5 mg/dL (ref 8.4–10.5)
Chloride: 108 mEq/L (ref 96–112)
Creatinine, Ser: 2.86 mg/dL — ABNORMAL HIGH (ref 0.40–1.50)
GFR: 20.29 mL/min — ABNORMAL LOW (ref >60.00)
Glucose, Bld: 110 mg/dL — ABNORMAL HIGH (ref 70–99)
Potassium: 5.5 mEq/L — ABNORMAL HIGH (ref 3.5–5.1)
Sodium: 140 mEq/L (ref 135–145)

## 2021-03-03 MED ORDER — BUDESONIDE ER 9 MG PO TB24
ORAL_TABLET | ORAL | 1 refills | Status: DC
Start: 2021-03-03 — End: 2021-04-13

## 2021-03-03 NOTE — Addendum Note (Signed)
Addended by: Boris Lown B on: 03/03/2021 10:50 AM   Modules accepted: Orders

## 2021-03-03 NOTE — Assessment & Plan Note (Signed)
Hold Benicar if diarrhea

## 2021-03-03 NOTE — Progress Notes (Signed)
Subjective:  Patient ID: Scott Oneal, male    DOB: Jul 03, 1941  Age: 80 y.o. MRN: [id number]  CC: Follow-up (1 week f/u)   HPI Kamare W Riel presents for diarrhea, weakness, fatigue On 4/29 loose stools stopped, on 5/1 - nl stool (stopped Lomotil). On 5/2 - 2 nl stools and 1 loose. He re-started Lomotil  F/u [facility]  Outpatient Medications Prior to Visit  Medication Sig Dispense Refill  . Ascorbic Acid (VITAMIN C PO) Take 1 tablet by mouth every other day.    Marland Kitchen aspirin 81 MG tablet Take 81 mg by mouth every evening.     . B Complex Vitamins (B COMPLEX PO) Take by mouth. Take 1 tablet three times a day    . calcitRIOL (ROCALTROL) 0.25 MCG capsule TAKE 1 CAPSULE BY MOUTH EVERY DAY 90 capsule 1  . carvedilol (COREG) 25 MG tablet TAKE 1 TABLET BY MOUTH TWICE A DAY WITH MEALS 180 tablet 3  . cetirizine (ZYRTEC) 10 MG tablet Take 10 mg by mouth daily as needed for allergies.    . Cholecalciferol 2000 UNITS TABS Take 2,000 Units by mouth daily.     . clonazePAM (KLONOPIN) 0.25 MG disintegrating tablet Take 1 tablet (0.25 mg total) by mouth 2 (two) times daily as needed (anxiety). 60 tablet 3  . diclofenac sodium (VOLTAREN) 1 % GEL APPLY 4 G TOPICALLY 4 TIMES DAILY. 200 g 5  . diphenoxylate-atropine (LOMOTIL) 2.5-0.025 MG tablet Take 1-2 tablets by mouth 4 (four) times daily as needed for diarrhea or loose stools. 60 tablet 1  . hydrALAZINE (APRESOLINE) 10 MG tablet Take 1-2 tablet 3 times a day as needed if blood pressure is >170 60 tablet 3  . isosorbide mononitrate (IMDUR) 30 MG 24 hr tablet TAKE 1 TABLET BY MOUTH EVERY DAY 90 tablet 2  . levothyroxine (SYNTHROID) 150 MCG tablet TAKE 1 TABLET BY MOUTH EVERY DAY 90 tablet 1  . Multiple Vitamin (MULTIVITAMIN) capsule Take 1 capsule by mouth daily.    . Multiple Vitamins-Minerals (PRESERVISION AREDS 2) CAPS Take 1 capsule by mouth in the morning and at bedtime.    . nitroGLYCERIN (NITROSTAT) 0.4 MG SL tablet Place 0.4 mg under the tongue every 5  (five) minutes as needed for chest pain.    Marland Kitchen olmesartan (BENICAR) 5 MG tablet Take 1 tablet (5 mg total) by mouth daily. 30 tablet 11  . Propylene Glycol (SYSTANE BALANCE) 0.6 % SOLN Place 1 drop into both eyes daily as needed (Dry eye). systane balance    . simvastatin (ZOCOR) 10 MG tablet TAKE 1 TABLET BY MOUTH EVERYDAY AT BEDTIME 90 tablet 3  . SYRINGE-NEEDLE, DISP, 3 ML (BD ECLIPSE SYRINGE) 23G X 1" 3 ML MISC As directed IM 50 each 2  . tamsulosin (FLOMAX) 0.4 MG CAPS capsule Take 0.4 mg by mouth.    . testosterone cypionate (DEPOTESTOSTERONE CYPIONATE) 200 MG/ML injection INJECT 2ML INTO MUSCLE EVERY 2 WEEKS 12 mL 1  . TURMERIC PO Take 1 capsule by mouth 3 (three) times daily.    Marland Kitchen UNABLE TO FIND Med Name: [facility] take 2 tablets by mouth daily     No facility-administered medications prior to visit.    ROS: Review of Systems  Constitutional: Positive for fatigue. Negative for appetite change and unexpected weight change.  HENT: Negative for congestion, nosebleeds, sneezing, sore throat and trouble swallowing.   Eyes: Negative for itching and visual disturbance.  Respiratory: Negative for cough.   Cardiovascular: Negative for chest pain,  palpitations and leg swelling.  Gastrointestinal: Positive for diarrhea. Negative for abdominal distention, blood in stool and nausea.  Genitourinary: Negative for frequency and hematuria.  Musculoskeletal: Negative for back pain, gait problem, joint swelling and neck pain.  Skin: Negative for rash.  Neurological: Negative for dizziness, tremors, speech difficulty and weakness.  Psychiatric/Behavioral: Negative for agitation, dysphoric mood and sleep disturbance. The patient is not nervous/anxious.     Objective:  BP 132/78 (BP Location: Left Arm)   Pulse 69   Temp 98.1 F (36.7 C) (Oral)   Ht 6' (1.829 m)   Wt 191 lb (86.6 kg)   SpO2 96%   BMI 25.90 kg/m   BP Readings from Last 3 Encounters:  03/03/21 132/78  02/24/21 (!) 144/86   12/14/20 (!) 146/78    Wt Readings from Last 3 Encounters:  03/03/21 191 lb (86.6 kg)  02/24/21 188 lb (85.3 kg)  12/14/20 209 lb (94.8 kg)    Physical Exam Constitutional:      General: He is not in acute distress.    Appearance: He is well-developed. He is obese.     Comments: NAD  Eyes:     Conjunctiva/sclera: Conjunctivae normal.     Pupils: Pupils are equal, round, and reactive to light.  Neck:     Thyroid: No thyromegaly.     Vascular: No JVD.  Cardiovascular:     Rate and Rhythm: Normal rate and regular rhythm.     Heart sounds: Normal heart sounds. No murmur heard. No friction rub. No gallop.   Pulmonary:     Effort: Pulmonary effort is normal. No respiratory distress.     Breath sounds: Normal breath sounds. No wheezing or rales.  Chest:     Chest wall: No tenderness.  Abdominal:     General: Bowel sounds are normal. There is no distension.     Palpations: Abdomen is soft. There is no mass.     Tenderness: There is no abdominal tenderness. There is no guarding or rebound.  Musculoskeletal:        General: No tenderness. Normal range of motion.     Cervical back: Normal range of motion.  Lymphadenopathy:     Cervical: No cervical adenopathy.  Skin:    General: Skin is warm and dry.     Findings: No rash.  Neurological:     Mental Status: He is alert and oriented to person, place, and time.     Cranial Nerves: No cranial nerve deficit.     Motor: No abnormal muscle tone.     Coordination: Coordination normal.     Gait: Gait normal.     Deep Tendon Reflexes: Reflexes are normal and symmetric.  Psychiatric:        Behavior: Behavior normal.        Thought Content: Thought content normal.        Judgment: Judgment normal.     Lab Results  Component Value Date   WBC 6.2 02/24/2021   HGB 17.1 (H) 02/24/2021   HCT 51.7 02/24/2021   PLT 100.0 (L) 02/24/2021   GLUCOSE 91 02/24/2021   CHOL 115 02/18/2020   TRIG 115 02/18/2020   HDL 34 (L) 02/18/2020    LDLCALC 60 02/18/2020   ALT 30 02/24/2021   AST 28 02/24/2021   NA 141 02/24/2021   K 4.8 02/24/2021   CL 105 02/24/2021   CREATININE 2.07 (H) 02/24/2021   BUN 19 02/24/2021   CO2 26 02/24/2021   TSH 3.84  08/24/2020   PSA 2.08 08/24/2020   INR 1.20 11/26/2014   HGBA1C 5.9 03/15/2016   MICROALBUR 1.2 05/18/2017    MR Lumbar Spine Wo Contrast  Result Date: 01/05/2021 CLINICAL DATA:  Low back pain, > 6 wks Low back pain, progressive neurologic deficit EXAM: MRI LUMBAR SPINE WITHOUT CONTRAST TECHNIQUE: Multiplanar, multisequence MR imaging of the lumbar spine was performed. No intravenous contrast was administered. COMPARISON:  12/14/2020 and prior FINDINGS: Segmentation:  Standard. Alignment:  Stepwise grade 1 retrolisthesis at the L2-4 levels. Vertebrae: Bone marrow heterogeneity. Scattered hyperintensities may reflect hemangiomata versus focal fat. Modic type 2 endplate degenerative changes. No fracture or aggressive osseous lesion. Minimal degenerative related bone marrow edema involving the facet joints at the L3-4 levels. Conus medullaris and cauda equina: Conus extends to the L1 level. Conus and cauda equina appear normal. Disc levels: Multilevel desiccation, disc space loss and Schmorl's node formation most prominent at the L3-4 level. L1-2: Minimal disc bulge and bilateral facet hypertrophy. Patent spinal canal and neural foramen. L2-3: Disc bulge and bilateral facet hypertrophy. Mild spinal canal and moderate bilateral neural foraminal narrowing. L3-4: Disc bulge with superimposed right foraminal and left subarticular protrusions. Ligamentum flavum thickening and bilateral facet hypertrophy. Moderate spinal canal and severe bilateral neural foraminal narrowing. L4-5: Disc bulge with superimposed right greater than left foraminal protrusions. Ligamentum flavum thickening and bilateral facet hypertrophy. Small central protrusion. Severe spinal canal and bilateral neural foraminal narrowing.  L5-S1: Disc bulge with small central protrusion and bilateral facet hypertrophy. Patent spinal canal. Mild right and moderate to severe left neural foraminal narrowing. Paraspinal and other soft tissues: Bilateral renal cysts. IMPRESSION: Moderate to severe spinal canal and bilateral neural foraminal narrowing at the L3-4, L4-5 levels. Moderate to severe bilateral L2-3 and left L5-S1 neural foraminal narrowing. Electronically Signed   By: Primitivo Gauze M.D.   On: 01/05/2021 16:46    Assessment & Plan:    Walker Kehr, MD

## 2021-03-03 NOTE — Assessment & Plan Note (Signed)
Stool C diff (-) Refractory diarrhea Empiric Rx - start Budesonde 9 mg po qd until GI appt GI appt is pending

## 2021-03-03 NOTE — Assessment & Plan Note (Addendum)
Check BMET Cont w/good hydration Hold Benicar if diarrhea

## 2021-03-19 ENCOUNTER — Ambulatory Visit (HOSPITAL_COMMUNITY): Payer: PPO | Attending: Internal Medicine

## 2021-03-19 ENCOUNTER — Telehealth: Payer: Self-pay | Admitting: Cardiovascular Disease

## 2021-03-19 ENCOUNTER — Other Ambulatory Visit: Payer: Self-pay

## 2021-03-19 DIAGNOSIS — I5042 Chronic combined systolic (congestive) and diastolic (congestive) heart failure: Secondary | ICD-10-CM

## 2021-03-19 DIAGNOSIS — E782 Mixed hyperlipidemia: Secondary | ICD-10-CM | POA: Insufficient documentation

## 2021-03-19 LAB — ECHOCARDIOGRAM COMPLETE
Area-P 1/2: 2.5 cm2
S' Lateral: 2.8 cm

## 2021-03-19 NOTE — Telephone Encounter (Signed)
Pt is having an echo today and stated that  Dr Burt Knack wanted him to schedule a f/u appt with him to discuss the results of the echo.  Dr Burt Knack doesn't have any available appointments for the rest of the year. Pt was adamant about seeing Dr Burt Knack as soon as possible because he's losing weight and unable to hold anything down.

## 2021-03-21 ENCOUNTER — Other Ambulatory Visit: Payer: Self-pay | Admitting: Cardiovascular Disease

## 2021-03-22 ENCOUNTER — Telehealth: Payer: Self-pay

## 2021-03-22 NOTE — Telephone Encounter (Signed)
Reviewed results with patient who verbalized understanding.   Scheduled the patient for visit with Dr. Burt Knack 06/07/21 as he has GI issues to control at the moment. He was grateful for call and assistance.

## 2021-03-22 NOTE — Telephone Encounter (Signed)
-----   Message from Sherren Mocha, MD sent at 03/22/2021 12:44 PM EDT ----- Echo reviewed and looks good.  LVEF has improved since previous echo.  Continue same therapy.

## 2021-03-24 ENCOUNTER — Ambulatory Visit: Payer: PPO | Admitting: Internal Medicine

## 2021-03-26 ENCOUNTER — Ambulatory Visit: Payer: PPO | Admitting: Internal Medicine

## 2021-03-31 DIAGNOSIS — N179 Acute kidney failure, unspecified: Secondary | ICD-10-CM | POA: Diagnosis not present

## 2021-03-31 DIAGNOSIS — E039 Hypothyroidism, unspecified: Secondary | ICD-10-CM | POA: Diagnosis not present

## 2021-03-31 DIAGNOSIS — M79661 Pain in right lower leg: Secondary | ICD-10-CM | POA: Diagnosis not present

## 2021-03-31 DIAGNOSIS — A419 Sepsis, unspecified organism: Secondary | ICD-10-CM | POA: Diagnosis not present

## 2021-03-31 DIAGNOSIS — E8809 Other disorders of plasma-protein metabolism, not elsewhere classified: Secondary | ICD-10-CM | POA: Diagnosis not present

## 2021-03-31 DIAGNOSIS — K900 Celiac disease: Secondary | ICD-10-CM | POA: Diagnosis not present

## 2021-03-31 DIAGNOSIS — N19 Unspecified kidney failure: Secondary | ICD-10-CM | POA: Diagnosis not present

## 2021-03-31 DIAGNOSIS — I21A1 Myocardial infarction type 2: Secondary | ICD-10-CM | POA: Diagnosis not present

## 2021-03-31 DIAGNOSIS — L03116 Cellulitis of left lower limb: Secondary | ICD-10-CM | POA: Diagnosis not present

## 2021-03-31 DIAGNOSIS — R6520 Severe sepsis without septic shock: Secondary | ICD-10-CM | POA: Diagnosis not present

## 2021-03-31 DIAGNOSIS — L03119 Cellulitis of unspecified part of limb: Secondary | ICD-10-CM | POA: Diagnosis not present

## 2021-03-31 DIAGNOSIS — R188 Other ascites: Secondary | ICD-10-CM | POA: Diagnosis not present

## 2021-03-31 DIAGNOSIS — R2243 Localized swelling, mass and lump, lower limb, bilateral: Secondary | ICD-10-CM | POA: Diagnosis not present

## 2021-03-31 DIAGNOSIS — A409 Streptococcal sepsis, unspecified: Secondary | ICD-10-CM | POA: Diagnosis not present

## 2021-03-31 DIAGNOSIS — E876 Hypokalemia: Secondary | ICD-10-CM | POA: Diagnosis not present

## 2021-03-31 DIAGNOSIS — Z20822 Contact with and (suspected) exposure to covid-19: Secondary | ICD-10-CM | POA: Diagnosis not present

## 2021-03-31 DIAGNOSIS — D696 Thrombocytopenia, unspecified: Secondary | ICD-10-CM | POA: Diagnosis not present

## 2021-03-31 DIAGNOSIS — R161 Splenomegaly, not elsewhere classified: Secondary | ICD-10-CM | POA: Diagnosis not present

## 2021-03-31 DIAGNOSIS — K766 Portal hypertension: Secondary | ICD-10-CM | POA: Diagnosis not present

## 2021-03-31 DIAGNOSIS — I272 Pulmonary hypertension, unspecified: Secondary | ICD-10-CM | POA: Diagnosis not present

## 2021-03-31 DIAGNOSIS — M79662 Pain in left lower leg: Secondary | ICD-10-CM | POA: Diagnosis not present

## 2021-03-31 DIAGNOSIS — R2242 Localized swelling, mass and lump, left lower limb: Secondary | ICD-10-CM | POA: Diagnosis not present

## 2021-03-31 DIAGNOSIS — I129 Hypertensive chronic kidney disease with stage 1 through stage 4 chronic kidney disease, or unspecified chronic kidney disease: Secondary | ICD-10-CM | POA: Diagnosis not present

## 2021-03-31 DIAGNOSIS — R509 Fever, unspecified: Secondary | ICD-10-CM | POA: Diagnosis not present

## 2021-03-31 DIAGNOSIS — N1832 Chronic kidney disease, stage 3b: Secondary | ICD-10-CM | POA: Diagnosis not present

## 2021-03-31 DIAGNOSIS — K9049 Malabsorption due to intolerance, not elsewhere classified: Secondary | ICD-10-CM | POA: Diagnosis not present

## 2021-03-31 DIAGNOSIS — I2510 Atherosclerotic heart disease of native coronary artery without angina pectoris: Secondary | ICD-10-CM | POA: Diagnosis not present

## 2021-03-31 DIAGNOSIS — D693 Immune thrombocytopenic purpura: Secondary | ICD-10-CM | POA: Diagnosis not present

## 2021-03-31 DIAGNOSIS — K811 Chronic cholecystitis: Secondary | ICD-10-CM | POA: Diagnosis not present

## 2021-04-01 DIAGNOSIS — R161 Splenomegaly, not elsewhere classified: Secondary | ICD-10-CM | POA: Diagnosis not present

## 2021-04-04 DIAGNOSIS — R2243 Localized swelling, mass and lump, lower limb, bilateral: Secondary | ICD-10-CM | POA: Diagnosis not present

## 2021-04-04 DIAGNOSIS — L03116 Cellulitis of left lower limb: Secondary | ICD-10-CM | POA: Diagnosis not present

## 2021-04-05 ENCOUNTER — Encounter: Payer: Self-pay | Admitting: Internal Medicine

## 2021-04-05 ENCOUNTER — Ambulatory Visit (INDEPENDENT_AMBULATORY_CARE_PROVIDER_SITE_OTHER): Payer: PPO | Admitting: Internal Medicine

## 2021-04-05 ENCOUNTER — Other Ambulatory Visit: Payer: Self-pay

## 2021-04-05 DIAGNOSIS — L03119 Cellulitis of unspecified part of limb: Secondary | ICD-10-CM

## 2021-04-05 DIAGNOSIS — I10 Essential (primary) hypertension: Secondary | ICD-10-CM

## 2021-04-05 DIAGNOSIS — R197 Diarrhea, unspecified: Secondary | ICD-10-CM

## 2021-04-05 DIAGNOSIS — R609 Edema, unspecified: Secondary | ICD-10-CM | POA: Diagnosis not present

## 2021-04-05 MED ORDER — SACCHAROMYCES BOULARDII 250 MG PO CAPS: 250.0000 mg | ORAL_CAPSULE | Freq: Two times a day (BID) | ORAL | 1 refills | Status: DC

## 2021-04-05 MED ORDER — TRAMADOL HCL 50 MG PO TABS: 25.0000 mg | ORAL_TABLET | Freq: Four times a day (QID) | ORAL | 0 refills | Status: AC | PRN

## 2021-04-05 NOTE — Assessment & Plan Note (Addendum)
Severe cellulitis in the LLE complicated by sepsis Finish Augmentin and Doxy at home Take Florastor for C. difficile colitis prevention Please see me back in 1 week

## 2021-04-05 NOTE — Progress Notes (Signed)
Subjective:  Patient ID: Scott Oneal, male    DOB: Dec 07, 1940  Age: 80 y.o. MRN: [id number]  CC: Follow-up (Pt was in the hosptial in [place] with cell)   HPI CEBASTIAN NEIS presents for post-hosp stay at [facility] for 4 days last week for cellulitis of the LLE and sepsis. He is on Augmentin now and Doxy  C/o pain in the leg 7/10 when touched      Outpatient Medications Prior to Visit  Medication Sig Dispense Refill   amoxicillin-clavulanate (AUGMENTIN) 125-31.25 MG/5ML suspension Take by mouth 3 (three) times daily.     Ascorbic Acid (VITAMIN C PO) Take 1 tablet by mouth every other day.     aspirin 81 MG tablet Take 81 mg by mouth every evening.      B Complex Vitamins (B COMPLEX PO) Take by mouth. Take 1 tablet three times a day     calcitRIOL (ROCALTROL) 0.25 MCG capsule TAKE 1 CAPSULE BY MOUTH EVERY DAY 90 capsule 1   carvedilol (COREG) 25 MG tablet TAKE 1 TABLET BY MOUTH TWICE A DAY WITH MEALS 180 tablet 3   cetirizine (ZYRTEC) 10 MG tablet Take 10 mg by mouth daily as needed for allergies.     Cholecalciferol 2000 UNITS TABS Take 2,000 Units by mouth daily.      clonazePAM (KLONOPIN) 0.25 MG disintegrating tablet Take 1 tablet (0.25 mg total) by mouth 2 (two) times daily as needed (anxiety). 60 tablet 3   diclofenac sodium (VOLTAREN) 1 % GEL APPLY 4 G TOPICALLY 4 TIMES DAILY. 200 g 5   diphenoxylate-atropine (LOMOTIL) 2.5-0.025 MG tablet Take 1-2 tablets by mouth 4 (four) times daily as needed for diarrhea or loose stools. 60 tablet 1   doxycycline (VIBRA-TABS) 100 MG tablet Take 100 mg by mouth 2 (two) times daily.     hydrALAZINE (APRESOLINE) 10 MG tablet Take 1-2 tablet 3 times a day as needed if blood pressure is >170 60 tablet 3   isosorbide mononitrate (IMDUR) 30 MG 24 hr tablet TAKE 1 TABLET BY MOUTH EVERY DAY 90 tablet 2   levothyroxine (SYNTHROID) 150 MCG tablet TAKE 1 TABLET BY MOUTH EVERY DAY 90 tablet 1   Multiple Vitamin (MULTIVITAMIN) capsule Take 1 capsule by mouth  daily.     Multiple Vitamins-Minerals (PRESERVISION AREDS 2) CAPS Take 1 capsule by mouth in the morning and at bedtime.     nitroGLYCERIN (NITROSTAT) 0.4 MG SL tablet Place 0.4 mg under the tongue every 5 (five) minutes as needed for chest pain.     olmesartan (BENICAR) 5 MG tablet TAKE 1 TABLET BY MOUTH EVERY DAY 90 tablet 3   Propylene Glycol (SYSTANE BALANCE) 0.6 % SOLN Place 1 drop into both eyes daily as needed (Dry eye). systane balance     simvastatin (ZOCOR) 10 MG tablet TAKE 1 TABLET BY MOUTH EVERYDAY AT BEDTIME 90 tablet 3   SYRINGE-NEEDLE, DISP, 3 ML (BD ECLIPSE SYRINGE) 23G X 1" 3 ML MISC As directed IM 50 each 2   tamsulosin (FLOMAX) 0.4 MG CAPS capsule Take 0.4 mg by mouth.     testosterone cypionate (DEPOTESTOSTERONE CYPIONATE) 200 MG/ML injection INJECT 2ML INTO MUSCLE EVERY 2 WEEKS 12 mL 1   TURMERIC PO Take 1 capsule by mouth 3 (three) times daily.     UNABLE TO FIND Med Name: [facility] take 2 tablets by mouth daily     Budesonide ER 9 MG TB24 1 po qam (Patient not taking: Reported on 04/05/2021) 30  tablet 1   No facility-administered medications prior to visit.    ROS: Review of Systems  Constitutional: Positive for fatigue. Negative for appetite change, fever and unexpected weight change.  HENT: Negative for congestion, nosebleeds, sneezing, sore throat and trouble swallowing.   Eyes: Negative for itching and visual disturbance.  Respiratory: Negative for cough.   Cardiovascular: Negative for chest pain, palpitations and leg swelling.  Gastrointestinal: Positive for diarrhea. Negative for abdominal distention, blood in stool and nausea.  Genitourinary: Negative for frequency and hematuria.  Musculoskeletal: Negative for back pain, gait problem, joint swelling and neck pain.  Skin: Positive for color change. Negative for rash.  Neurological: Negative for dizziness, tremors, speech difficulty and weakness.  Psychiatric/Behavioral: Negative for agitation, dysphoric mood  and sleep disturbance. The patient is not nervous/anxious.     Objective:  BP 120/80   Pulse 83   Ht 6' (1.829 m)   Wt 194 lb (88 kg)   SpO2 98%   BMI 26.31 kg/m   BP Readings from Last 3 Encounters:  04/05/21 120/80  03/03/21 132/78  02/24/21 (!) 144/86    Wt Readings from Last 3 Encounters:  04/05/21 194 lb (88 kg)  03/03/21 191 lb (86.6 kg)  02/24/21 188 lb (85.3 kg)    Physical Exam Constitutional:      General: He is not in acute distress.    Appearance: He is well-developed.     Comments: NAD  Eyes:     Conjunctiva/sclera: Conjunctivae normal.     Pupils: Pupils are equal, round, and reactive to light.  Neck:     Thyroid: No thyromegaly.     Vascular: No JVD.  Cardiovascular:     Rate and Rhythm: Normal rate and regular rhythm.     Heart sounds: Normal heart sounds. No murmur heard. No friction rub. No gallop.   Pulmonary:     Effort: Pulmonary effort is normal. No respiratory distress.     Breath sounds: Normal breath sounds. No wheezing or rales.  Chest:     Chest wall: No tenderness.  Abdominal:     General: Bowel sounds are normal. There is no distension.     Palpations: Abdomen is soft. There is no mass.     Tenderness: There is no abdominal tenderness. There is no guarding or rebound.  Musculoskeletal:        General: Tenderness present. Normal range of motion.     Cervical back: Normal range of motion.     Right lower leg: No edema.     Left lower leg: Edema present.  Lymphadenopathy:     Cervical: No cervical adenopathy.  Skin:    General: Skin is warm and dry.     Findings: No rash.  Neurological:     Mental Status: He is alert and oriented to person, place, and time.     Cranial Nerves: No cranial nerve deficit.     Motor: Weakness present. No abnormal muscle tone.     Coordination: Coordination normal.     Gait: Gait abnormal.     Deep Tendon Reflexes: Reflexes are normal and symmetric.  Psychiatric:        Behavior: Behavior normal.         Thought Content: Thought content normal.        Judgment: Judgment normal.   LLE w/swelling, erythema  Lab Results  Component Value Date   WBC 6.2 02/24/2021   HGB 17.1 (H) 02/24/2021   HCT 51.7 02/24/2021   PLT  100.0 (L) 02/24/2021   GLUCOSE 110 (H) 03/03/2021   CHOL 115 02/18/2020   TRIG 115 02/18/2020   HDL 34 (L) 02/18/2020   LDLCALC 60 02/18/2020   ALT 30 02/24/2021   AST 28 02/24/2021   NA 140 03/03/2021   K 5.5 No hemolysis seen (H) 03/03/2021   CL 108 03/03/2021   CREATININE 2.86 (H) 03/03/2021   BUN 52 (H) 03/03/2021   CO2 26 03/03/2021   TSH 3.84 08/24/2020   PSA 2.08 08/24/2020   INR 1.20 11/26/2014   HGBA1C 5.9 03/15/2016   MICROALBUR 1.2 05/18/2017    MR Lumbar Spine Wo Contrast  Result Date: 01/05/2021 CLINICAL DATA:  Low back pain, > 6 wks Low back pain, progressive neurologic deficit EXAM: MRI LUMBAR SPINE WITHOUT CONTRAST TECHNIQUE: Multiplanar, multisequence MR imaging of the lumbar spine was performed. No intravenous contrast was administered. COMPARISON:  12/14/2020 and prior FINDINGS: Segmentation:  Standard. Alignment:  Stepwise grade 1 retrolisthesis at the L2-4 levels. Vertebrae: Bone marrow heterogeneity. Scattered hyperintensities may reflect hemangiomata versus focal fat. Modic type 2 endplate degenerative changes. No fracture or aggressive osseous lesion. Minimal degenerative related bone marrow edema involving the facet joints at the L3-4 levels. Conus medullaris and cauda equina: Conus extends to the L1 level. Conus and cauda equina appear normal. Disc levels: Multilevel desiccation, disc space loss and Schmorl's node formation most prominent at the L3-4 level. L1-2: Minimal disc bulge and bilateral facet hypertrophy. Patent spinal canal and neural foramen. L2-3: Disc bulge and bilateral facet hypertrophy. Mild spinal canal and moderate bilateral neural foraminal narrowing. L3-4: Disc bulge with superimposed right foraminal and left subarticular  protrusions. Ligamentum flavum thickening and bilateral facet hypertrophy. Moderate spinal canal and severe bilateral neural foraminal narrowing. L4-5: Disc bulge with superimposed right greater than left foraminal protrusions. Ligamentum flavum thickening and bilateral facet hypertrophy. Small central protrusion. Severe spinal canal and bilateral neural foraminal narrowing. L5-S1: Disc bulge with small central protrusion and bilateral facet hypertrophy. Patent spinal canal. Mild right and moderate to severe left neural foraminal narrowing. Paraspinal and other soft tissues: Bilateral renal cysts. IMPRESSION: Moderate to severe spinal canal and bilateral neural foraminal narrowing at the L3-4, L4-5 levels. Moderate to severe bilateral L2-3 and left L5-S1 neural foraminal narrowing. Electronically Signed   By: Primitivo Gauze M.D.   On: 01/05/2021 16:46    Assessment & Plan:     Walker Kehr, MD

## 2021-04-12 ENCOUNTER — Other Ambulatory Visit: Payer: Self-pay | Admitting: Internal Medicine

## 2021-04-13 ENCOUNTER — Encounter: Payer: Self-pay | Admitting: Gastroenterology

## 2021-04-13 ENCOUNTER — Ambulatory Visit: Payer: PPO | Admitting: Gastroenterology

## 2021-04-13 ENCOUNTER — Other Ambulatory Visit (INDEPENDENT_AMBULATORY_CARE_PROVIDER_SITE_OTHER): Payer: PPO

## 2021-04-13 ENCOUNTER — Other Ambulatory Visit: Payer: Self-pay

## 2021-04-13 VITALS — BP 130/80 | HR 82 | Ht 72.0 in | Wt 185.0 lb

## 2021-04-13 DIAGNOSIS — R197 Diarrhea, unspecified: Secondary | ICD-10-CM

## 2021-04-13 DIAGNOSIS — R634 Abnormal weight loss: Secondary | ICD-10-CM | POA: Diagnosis not present

## 2021-04-13 LAB — CBC WITH DIFFERENTIAL/PLATELET
Basophils Absolute: 0 10*3/uL (ref 0.0–0.1)
Basophils Relative: 0.3 % (ref 0.0–3.0)
Eosinophils Absolute: 0.1 10*3/uL (ref 0.0–0.7)
Eosinophils Relative: 1.1 % (ref 0.0–5.0)
HCT: 45.3 % (ref 39.0–52.0)
Hemoglobin: 15.2 g/dL (ref 13.0–17.0)
Lymphocytes Relative: 17.3 % (ref 12.0–46.0)
Lymphs Abs: 1.1 10*3/uL (ref 0.7–4.0)
MCHC: 33.6 g/dL (ref 30.0–36.0)
MCV: 92.2 fl (ref 78.0–100.0)
Monocytes Absolute: 0.5 10*3/uL (ref 0.1–1.0)
Monocytes Relative: 6.9 % (ref 3.0–12.0)
Neutro Abs: 4.9 10*3/uL (ref 1.4–7.7)
Neutrophils Relative %: 74.4 % (ref 43.0–77.0)
Platelets: 162 10*3/uL (ref 150.0–400.0)
RBC: 4.91 Mil/uL (ref 4.22–5.81)
RDW: 15.3 % (ref 11.5–15.5)
WBC: 6.6 10*3/uL (ref 4.0–10.5)

## 2021-04-13 LAB — COMPREHENSIVE METABOLIC PANEL
ALT: 29 U/L (ref 0–53)
AST: 27 U/L (ref 0–37)
Albumin: 3.9 g/dL (ref 3.5–5.2)
Alkaline Phosphatase: 69 U/L (ref 39–117)
BUN: 14 mg/dL (ref 6–23)
CO2: 30 mEq/L (ref 19–32)
Calcium: 9.3 mg/dL (ref 8.4–10.5)
Chloride: 104 mEq/L (ref 96–112)
Creatinine, Ser: 2.08 mg/dL — ABNORMAL HIGH (ref 0.40–1.50)
GFR: 29.72 mL/min — ABNORMAL LOW (ref >60.00)
Glucose, Bld: 83 mg/dL (ref 70–99)
Potassium: 4.8 mEq/L (ref 3.5–5.1)
Sodium: 140 mEq/L (ref 135–145)
Total Bilirubin: 1.1 mg/dL (ref 0.2–1.2)
Total Protein: 6.6 g/dL (ref 6.0–8.3)

## 2021-04-13 LAB — C-REACTIVE PROTEIN: CRP: <1 mg/dL (ref 0.5–20.0)

## 2021-04-13 LAB — TSH: TSH: 12.65 u[IU]/mL — ABNORMAL HIGH (ref 0.35–4.50)

## 2021-04-13 NOTE — Progress Notes (Signed)
    History of Present Illness: This is a 80 year old male complaining of diarrhea and weight loss that started in April.  Colonoscopy performed in January as below.  He relates no travel, medication changes or antibiotic use around the time his symptoms started.  Stool lactoferrin was positive.  C. difficile, Cryptosporidium and Giardia were negative.  He relates 6-7 loose watery stools each day.  He often has diarrhea at night.  His weight has decreased from 215-185 over the past few months.  He was diagnosed with cellulitis in his left lower extremity and was hospitalized in [place] in early June and was treated with antibiotics.  He completed a course of antibiotics of a week ago with no change in his diarrhea.  He denies rectal bleeding, fevers, chills.  He does note mild generalized abdominal pain.  Colonoscopy 10/2020 - One 6 mm polyp in the transverse colon, removed with a cold snare. Resected and retrieved. Clips (MR conditional) were placed. - Internal hemorrhoids. - The examination was otherwise normal on direct and retroflexion views.  Current Medications, Allergies, Past Medical History, Past Surgical History, Family History and Social History were reviewed in [employer] record.   Physical Exam: General: Well developed, well nourished, no acute distress Head: Normocephalic and atraumatic Eyes: Sclerae anicteric, EOMI Ears: Normal auditory acuity Mouth: Not examined, mask on during Covid-19 pandemic Lungs: Clear throughout to auscultation Heart: Regular rate and rhythm; no murmurs, rubs or bruits Abdomen: Soft, non tender and non distended. No masses, hepatosplenomegaly or hernias noted. Normal Bowel sounds Rectal: Not done Musculoskeletal: Symmetrical with no gross deformities  Pulses:  Normal pulses noted Extremities: No clubbing, cyanosis, edema or deformities noted Neurological: Alert oriented x 4, grossly nonfocal Psychological:  Alert and  cooperative. Normal mood and affect   Assessment and Recommendations:  Diarrhea and 30 pound unintentional weight loss concerning for malabsorption.  Rule out olmesartan induced sprue-like enteropathy, celiac disease, infectious causes, IBD.  CBC, CMP, CRP, TSH, tTG, IgA, GI stool profile today.  Imodium twice daily as needed.  Continue Florastor twice daily.  Schedule EGD with duodenal biopsies. The risks (including bleeding, perforation, infection, missed lesions, medication reactions and possible hospitalization or surgery if complications occur), benefits, and alternatives to endoscopy with possible biopsy and possible dilation were discussed with the patient and they consent to proceed.

## 2021-04-13 NOTE — Patient Instructions (Signed)
Your provider has requested that you go to the basement level for lab work before leaving today. Press "B" on the elevator. The lab is located at the first door on the left as you exit the elevator.  You can take over the counter Imodium twice daily as needed for diarrhea symptoms.   You have been scheduled for an endoscopy. Please follow written instructions given to you at your visit today. If you use inhalers (even only as needed), please bring them with you on the day of your procedure.  Due to recent changes in healthcare laws, you may see the results of your imaging and laboratory studies on MyChart before your provider has had a chance to review them.  We understand that in some cases there may be results that are confusing or concerning to you. Not all laboratory results come back in the same time frame and the provider may be waiting for multiple results in order to interpret others.  Please give [place] 48 hours in order for your provider to thoroughly review all the results before contacting the office for clarification of your results.   Thank you for choosing me and [facility].  Pricilla Riffle. Dagoberto Ligas., MD., [facility] Reg[facility]

## 2021-04-13 NOTE — Addendum Note (Signed)
Addended by: Trenda Moots on: 6/71/2458 11:23 AM   Modules accepted: Orders

## 2021-04-14 ENCOUNTER — Ambulatory Visit: Payer: PPO | Admitting: Internal Medicine

## 2021-04-14 ENCOUNTER — Ambulatory Visit (INDEPENDENT_AMBULATORY_CARE_PROVIDER_SITE_OTHER): Payer: PPO | Admitting: Internal Medicine

## 2021-04-14 ENCOUNTER — Other Ambulatory Visit: Payer: Self-pay

## 2021-04-14 ENCOUNTER — Encounter: Payer: Self-pay | Admitting: Internal Medicine

## 2021-04-14 VITALS — BP 132/70 | HR 95 | Temp 98.0°F | Ht 72.0 in | Wt 182.6 lb

## 2021-04-14 DIAGNOSIS — R7989 Other specified abnormal findings of blood chemistry: Secondary | ICD-10-CM | POA: Insufficient documentation

## 2021-04-14 DIAGNOSIS — R634 Abnormal weight loss: Secondary | ICD-10-CM | POA: Diagnosis not present

## 2021-04-14 DIAGNOSIS — L03119 Cellulitis of unspecified part of limb: Secondary | ICD-10-CM

## 2021-04-14 DIAGNOSIS — I25119 Atherosclerotic heart disease of native coronary artery with unspecified angina pectoris: Secondary | ICD-10-CM | POA: Diagnosis not present

## 2021-04-14 DIAGNOSIS — N1832 Chronic kidney disease, stage 3b: Secondary | ICD-10-CM

## 2021-04-14 DIAGNOSIS — K900 Celiac disease: Secondary | ICD-10-CM

## 2021-04-14 DIAGNOSIS — R197 Diarrhea, unspecified: Secondary | ICD-10-CM | POA: Diagnosis not present

## 2021-04-14 DIAGNOSIS — R5383 Other fatigue: Secondary | ICD-10-CM | POA: Diagnosis not present

## 2021-04-14 LAB — TISSUE TRANSGLUTAMINASE, IGA: (tTG) Ab, IgA: <1 U/mL

## 2021-04-14 LAB — IGA: Immunoglobulin A: 69 mg/dL — ABNORMAL LOW (ref 70–320)

## 2021-04-14 NOTE — Progress Notes (Signed)
Subjective:  Patient ID: Scott Oneal, male    DOB: 29-Apr-1941  Age: 80 y.o. MRN: [id number]  CC: Follow-up (1 WEEK F/U)   HPI [place] presents for diarrhea, LLE celluitis, abn TSH of 12, CRF   Outpatient Medications Prior to Visit  Medication Sig Dispense Refill   Ascorbic Acid (VITAMIN C PO) Take 1 tablet by mouth every other day.     aspirin 81 MG tablet Take 81 mg by mouth every evening.      B Complex Vitamins (B COMPLEX PO) Take by mouth. Take 1 tablet three times a day     calcitRIOL (ROCALTROL) 0.25 MCG capsule TAKE 1 CAPSULE BY MOUTH EVERY DAY 90 capsule 1   carvedilol (COREG) 25 MG tablet TAKE 1 TABLET BY MOUTH TWICE A DAY WITH MEALS 180 tablet 3   cetirizine (ZYRTEC) 10 MG tablet Take 10 mg by mouth daily as needed for allergies.     Cholecalciferol 2000 UNITS TABS Take 2,000 Units by mouth daily.      clonazePAM (KLONOPIN) 0.25 MG disintegrating tablet Take 1 tablet (0.25 mg total) by mouth 2 (two) times daily as needed (anxiety). 60 tablet 3   diclofenac sodium (VOLTAREN) 1 % GEL APPLY 4 G TOPICALLY 4 TIMES DAILY. 200 g 5   hydrALAZINE (APRESOLINE) 10 MG tablet Take 1-2 tablet 3 times a day as needed if blood pressure is >170 60 tablet 3   isosorbide mononitrate (IMDUR) 30 MG 24 hr tablet TAKE 1 TABLET BY MOUTH EVERY DAY 90 tablet 2   levothyroxine (SYNTHROID) 150 MCG tablet TAKE 1 TABLET BY MOUTH EVERY DAY 90 tablet 1   Multiple Vitamin (MULTIVITAMIN) capsule Take 1 capsule by mouth daily.     Multiple Vitamins-Minerals (PRESERVISION AREDS 2) CAPS Take 1 capsule by mouth in the morning and at bedtime.     nitroGLYCERIN (NITROSTAT) 0.4 MG SL tablet Place 0.4 mg under the tongue every 5 (five) minutes as needed for chest pain.     olmesartan (BENICAR) 5 MG tablet TAKE 1 TABLET BY MOUTH EVERY DAY 90 tablet 3   Propylene Glycol (SYSTANE BALANCE) 0.6 % SOLN Place 1 drop into both eyes daily as needed (Dry eye). systane balance     saccharomyces boulardii (FLORASTOR)  250 MG capsule Take 1 capsule (250 mg total) by mouth 2 (two) times daily. 60 capsule 1   simvastatin (ZOCOR) 10 MG tablet TAKE 1 TABLET BY MOUTH EVERYDAY AT BEDTIME 90 tablet 3   SYRINGE-NEEDLE, DISP, 3 ML (BD ECLIPSE SYRINGE) 23G X 1" 3 ML MISC As directed IM 50 each 2   tamsulosin (FLOMAX) 0.4 MG CAPS capsule Take 0.4 mg by mouth.     testosterone cypionate (DEPOTESTOSTERONE CYPIONATE) 200 MG/ML injection INJECT 2ML INTO MUSCLE EVERY 2 WEEKS 12 mL 1   TURMERIC PO Take 1 capsule by mouth 3 (three) times daily.     UNABLE TO FIND Med Name: [facility] Dt take 2 tablets by mouth daily     No facility-administered medications prior to visit.    ROS: Review of Systems  Constitutional:  Negative for appetite change, fatigue and unexpected weight change.  HENT:  Negative for congestion, nosebleeds, sneezing, sore throat and trouble swallowing.   Eyes:  Negative for itching and visual disturbance.  Respiratory:  Negative for cough.   Cardiovascular:  Positive for leg swelling. Negative for chest pain and palpitations.  Gastrointestinal:  Negative for abdominal distention, blood in stool, diarrhea and nausea.  Genitourinary:  Negative  for frequency and hematuria.  Musculoskeletal:  Negative for back pain, gait problem, joint swelling and neck pain.  Skin:  Positive for color change. Negative for rash.  Neurological:  Negative for dizziness, tremors, speech difficulty and weakness.  Psychiatric/Behavioral:  Negative for agitation, dysphoric mood and sleep disturbance. The patient is not nervous/anxious.    Objective:  BP 132/70 (BP Location: Left Arm)   Pulse 95   Temp 98 F (36.7 C) (Oral)   Ht 6' (1.829 m)   Wt 182 lb 9.6 oz (82.8 kg)   SpO2 96%   BMI 24.77 kg/m   BP Readings from Last 3 Encounters:  04/14/21 132/70  04/13/21 130/80  04/05/21 120/80    Wt Readings from Last 3 Encounters:  04/14/21 182 lb 9.6 oz (82.8 kg)  04/13/21 185 lb (83.9 kg)  04/05/21 194 lb (88 kg)     Physical Exam Constitutional:      General: He is not in acute distress.    Appearance: He is well-developed.     Comments: NAD  Eyes:     Conjunctiva/sclera: Conjunctivae normal.     Pupils: Pupils are equal, round, and reactive to light.  Neck:     Thyroid: No thyromegaly.     Vascular: No JVD.  Cardiovascular:     Rate and Rhythm: Normal rate and regular rhythm.     Heart sounds: Normal heart sounds. No murmur heard.   No friction rub. No gallop.  Pulmonary:     Effort: Pulmonary effort is normal. No respiratory distress.     Breath sounds: Normal breath sounds. No wheezing or rales.  Chest:     Chest wall: No tenderness.  Abdominal:     General: Bowel sounds are normal. There is no distension.     Palpations: Abdomen is soft. There is no mass.     Tenderness: There is no abdominal tenderness. There is no guarding or rebound.  Musculoskeletal:        General: No tenderness. Normal range of motion.     Cervical back: Normal range of motion.     Right lower leg: Edema present.     Left lower leg: Edema present.  Lymphadenopathy:     Cervical: No cervical adenopathy.  Skin:    General: Skin is warm and dry.     Findings: No rash.  Neurological:     Mental Status: He is alert and oriented to person, place, and time.     Cranial Nerves: No cranial nerve deficit.     Motor: No abnormal muscle tone.     Coordination: Coordination normal.     Gait: Gait normal.     Deep Tendon Reflexes: Reflexes are normal and symmetric.  Psychiatric:        Behavior: Behavior normal.        Thought Content: Thought content normal.        Judgment: Judgment normal.  Trace edema more on the left lower extremity Calves without tenderness LLE skin is pink -looks much better  Lab Results  Component Value Date   WBC 6.6 04/13/2021   HGB 15.2 04/13/2021   HCT 45.3 04/13/2021   PLT 162.0 04/13/2021   GLUCOSE 83 04/13/2021   CHOL 115 02/18/2020   TRIG 115 02/18/2020   HDL 34 (L)  02/18/2020   LDLCALC 60 02/18/2020   ALT 29 04/13/2021   AST 27 04/13/2021   NA 140 04/13/2021   K 4.8 04/13/2021   CL 104 04/13/2021  CREATININE 2.08 (H) 04/13/2021   BUN 14 04/13/2021   CO2 30 04/13/2021   TSH 12.65 (H) 04/13/2021   PSA 2.08 08/24/2020   INR 1.20 11/26/2014   HGBA1C 5.9 03/15/2016   MICROALBUR 1.2 05/18/2017    MR Lumbar Spine Wo Contrast  Result Date: 01/05/2021 CLINICAL DATA:  Low back pain, > 6 wks Low back pain, progressive neurologic deficit EXAM: MRI LUMBAR SPINE WITHOUT CONTRAST TECHNIQUE: Multiplanar, multisequence MR imaging of the lumbar spine was performed. No intravenous contrast was administered. COMPARISON:  12/14/2020 and prior FINDINGS: Segmentation:  Standard. Alignment:  Stepwise grade 1 retrolisthesis at the L2-4 levels. Vertebrae: Bone marrow heterogeneity. Scattered hyperintensities may reflect hemangiomata versus focal fat. Modic type 2 endplate degenerative changes. No fracture or aggressive osseous lesion. Minimal degenerative related bone marrow edema involving the facet joints at the L3-4 levels. Conus medullaris and cauda equina: Conus extends to the L1 level. Conus and cauda equina appear normal. Disc levels: Multilevel desiccation, disc space loss and Schmorl's node formation most prominent at the L3-4 level. L1-2: Minimal disc bulge and bilateral facet hypertrophy. Patent spinal canal and neural foramen. L2-3: Disc bulge and bilateral facet hypertrophy. Mild spinal canal and moderate bilateral neural foraminal narrowing. L3-4: Disc bulge with superimposed right foraminal and left subarticular protrusions. Ligamentum flavum thickening and bilateral facet hypertrophy. Moderate spinal canal and severe bilateral neural foraminal narrowing. L4-5: Disc bulge with superimposed right greater than left foraminal protrusions. Ligamentum flavum thickening and bilateral facet hypertrophy. Small central protrusion. Severe spinal canal and bilateral neural  foraminal narrowing. L5-S1: Disc bulge with small central protrusion and bilateral facet hypertrophy. Patent spinal canal. Mild right and moderate to severe left neural foraminal narrowing. Paraspinal and other soft tissues: Bilateral renal cysts. IMPRESSION: Moderate to severe spinal canal and bilateral neural foraminal narrowing at the L3-4, L4-5 levels. Moderate to severe bilateral L2-3 and left L5-S1 neural foraminal narrowing. Electronically Signed   By: Primitivo Gauze M.D.   On: 01/05/2021 16:46    Assessment & Plan:     Follow-up: No follow-ups on file.  Walker Kehr, MD

## 2021-04-15 ENCOUNTER — Other Ambulatory Visit (INDEPENDENT_AMBULATORY_CARE_PROVIDER_SITE_OTHER): Payer: PPO

## 2021-04-15 ENCOUNTER — Other Ambulatory Visit: Payer: PPO

## 2021-04-15 DIAGNOSIS — K900 Celiac disease: Secondary | ICD-10-CM

## 2021-04-15 DIAGNOSIS — R5383 Other fatigue: Secondary | ICD-10-CM

## 2021-04-15 DIAGNOSIS — R7989 Other specified abnormal findings of blood chemistry: Secondary | ICD-10-CM

## 2021-04-16 LAB — COMPREHENSIVE METABOLIC PANEL
ALT: 23 U/L (ref 0–53)
AST: 25 U/L (ref 0–37)
Albumin: 3.6 g/dL (ref 3.5–5.2)
Alkaline Phosphatase: 62 U/L (ref 39–117)
BUN: 18 mg/dL (ref 6–23)
CO2: 29 mEq/L (ref 19–32)
Calcium: 8.9 mg/dL (ref 8.4–10.5)
Chloride: 103 mEq/L (ref 96–112)
Creatinine, Ser: 2.25 mg/dL — ABNORMAL HIGH (ref 0.40–1.50)
GFR: 27.04 mL/min — ABNORMAL LOW (ref >60.00)
Glucose, Bld: 81 mg/dL (ref 70–99)
Potassium: 4.8 mEq/L (ref 3.5–5.1)
Sodium: 139 mEq/L (ref 135–145)
Total Bilirubin: 0.7 mg/dL (ref 0.2–1.2)
Total Protein: 5.9 g/dL — ABNORMAL LOW (ref 6.0–8.3)

## 2021-04-16 LAB — T3, FREE: T3, Free: 2.1 pg/mL — ABNORMAL LOW (ref 2.3–4.2)

## 2021-04-16 LAB — T4, FREE: Free T4: 0.91 ng/dL (ref 0.60–1.60)

## 2021-04-16 LAB — TSH: TSH: 11.04 u[IU]/mL — ABNORMAL HIGH (ref 0.35–4.50)

## 2021-04-17 ENCOUNTER — Other Ambulatory Visit: Payer: Self-pay | Admitting: Internal Medicine

## 2021-04-17 MED ORDER — LEVOTHYROXINE SODIUM 175 MCG PO TABS: 175.0000 ug | ORAL_TABLET | Freq: Every day | ORAL | 3 refills | Status: DC

## 2021-04-17 NOTE — Progress Notes (Signed)
Rx

## 2021-04-18 LAB — GI PROFILE, STOOL, PCR

## 2021-04-19 DIAGNOSIS — H353112 Nonexudative age-related macular degeneration, right eye, intermediate dry stage: Secondary | ICD-10-CM | POA: Diagnosis not present

## 2021-04-19 DIAGNOSIS — H353121 Nonexudative age-related macular degeneration, left eye, early dry stage: Secondary | ICD-10-CM | POA: Diagnosis not present

## 2021-04-19 NOTE — Assessment & Plan Note (Signed)
No angina at present.  Continue with aspirin, Coreg

## 2021-04-19 NOTE — Assessment & Plan Note (Signed)
We will continue to monitor GFR

## 2021-04-19 NOTE — Assessment & Plan Note (Addendum)
Scott Oneal has finished Augmentin and doxycycline.  The problem seems to be resolving.  We will continue with leg elevation.  Records from her recent hospitalization including leg Doppler ultrasound, echocardiogram and others were reviewed

## 2021-04-19 NOTE — Assessment & Plan Note (Signed)
Worse after cellulitis.  Elevate leg several times a day.  He can start wearing compression socks when acute inflammation from cellulitis is subsided.  No DVT of the hospital

## 2021-04-19 NOTE — Assessment & Plan Note (Signed)
It is worse after his recent sepsis/cellulitis/antibiotic therapy.  Take Florastor.  Obtain stool for C. difficile.  Follow-up with Dr. Fuller Plan. Continue with budesonide p.o. and Lomotil as needed

## 2021-04-19 NOTE — Assessment & Plan Note (Signed)
Continue w/Coreg, Hydralazine, Benicar

## 2021-04-20 ENCOUNTER — Ambulatory Visit (AMBULATORY_SURGERY_CENTER): Payer: PPO | Admitting: Gastroenterology

## 2021-04-20 ENCOUNTER — Encounter: Payer: Self-pay | Admitting: Gastroenterology

## 2021-04-20 ENCOUNTER — Other Ambulatory Visit: Payer: Self-pay

## 2021-04-20 VITALS — BP 120/64 | HR 63 | Temp 98.6°F | Resp 16 | Ht 72.0 in | Wt 185.0 lb

## 2021-04-20 DIAGNOSIS — R197 Diarrhea, unspecified: Secondary | ICD-10-CM | POA: Diagnosis not present

## 2021-04-20 DIAGNOSIS — R1084 Generalized abdominal pain: Secondary | ICD-10-CM

## 2021-04-20 DIAGNOSIS — K2970 Gastritis, unspecified, without bleeding: Secondary | ICD-10-CM

## 2021-04-20 DIAGNOSIS — R634 Abnormal weight loss: Secondary | ICD-10-CM

## 2021-04-20 DIAGNOSIS — K2980 Duodenitis without bleeding: Secondary | ICD-10-CM | POA: Diagnosis not present

## 2021-04-20 MED ORDER — PANTOPRAZOLE SODIUM 40 MG PO TBEC: 40.0000 mg | DELAYED_RELEASE_TABLET | Freq: Every day | ORAL | 11 refills | Status: DC

## 2021-04-20 MED ORDER — SODIUM CHLORIDE 0.9 % IV SOLN: 500.0000 mL | Freq: Once | INTRAVENOUS | Status: DC

## 2021-04-20 NOTE — Progress Notes (Signed)
pt tolerated well. VSS. awake and to recovery. Report given to RN.  Bite block left insitu to recovery. No trauma.

## 2021-04-20 NOTE — Op Note (Signed)
[facility] Patient Name: [name] Procedure Date: 04/20/2021 7:18 AM MRN: [id number] Endoscopist: Ladene Artist , MD Age: 80 Referring MD:  Date of Birth: Nov 05, 1940 Gender: Male Account #: [id number] Procedure:                Upper GI endoscopy Indications:              Generalized abdominal pain, Diarrhea, Weight loss Medicines:                Monitored Anesthesia Care Procedure:                Pre-Anesthesia Assessment:                           - Prior to the procedure, a History and Physical                            was performed, and patient medications and                            allergies were reviewed. The patient's tolerance of                            previous anesthesia was also reviewed. The risks                            and benefits of the procedure and the sedation                            options and risks were discussed with the patient.                            All questions were answered, and informed consent                            was obtained. Prior Anticoagulants: The patient has                            taken no previous anticoagulant or antiplatelet                            agents. ASA Grade Assessment: III - A patient with                            severe systemic disease. After reviewing the risks                            and benefits, the patient was deemed in                            satisfactory condition to undergo the procedure.                           After obtaining informed consent, the endoscope was  passed under direct vision. Throughout the                            procedure, the patient's blood pressure, pulse, and                            oxygen saturations were monitored continuously. The                            Endoscope was introduced through the mouth, and                            advanced to the second part of duodenum. The upper                            GI  endoscopy was accomplished without difficulty.                            The patient tolerated the procedure well. Scope In: Scope Out: Findings:                 The examined esophagus was normal.                           Patchy mild inflammation characterized by erythema,                            friability and granularity was found in the entire                            examined stomach. Biopsies were taken with a cold                            forceps for histology.                           The exam of the stomach was otherwise normal.                           Multiple diffuse erosions without bleeding were                            found in the duodenal bulb and in the second                            portion of the duodenum. Biopsies were taken with a                            cold forceps for histology.                           The exam of the duodenum was otherwise normal. Complications:            No immediate complications. Estimated Blood Loss:     Estimated blood loss was minimal. Impression:               -  Normal esophagus.                           - Gastritis. Biopsied.                           - Duodenal erosions without bleeding. Biopsied. Recommendation:           - Patient has a contact number available for                            emergencies. The signs and symptoms of potential                            delayed complications were discussed with the                            patient. Return to normal activities tomorrow.                            Written discharge instructions were provided to the                            patient.                           - Resume previous diet.                           - Continue present medications.                           - Await pathology results.                           - Schedule CT scan (computed tomography) of abdomen                            with contrast and pelvis with contrast at the next                             available appointment.                           - Protonix (pantoprazole) 40 mg PO daily, 1 year of                            refills.                           - Send fecal elastase. Ladene Artist, MD 04/20/2021 8:22:39 AM This report has been signed electronically.

## 2021-04-20 NOTE — Patient Instructions (Addendum)
Handout provided on gastritis.   Start Protonix (pantoprazole) 40 mg by mouth once daily.   My nurse will contact you to schedule the CT scan of the abdomen and pelvis. Oral contrast and instructions provided today.   Stool test ordered. Kit and instructions provided today.   YOU HAD AN ENDOSCOPIC PROCEDURE TODAY AT [facility]:   Refer to the procedure report that was given to you for any specific questions about what was found during the examination.  If the procedure report does not answer your questions, please call your gastroenterologist to clarify.  If you requested that your care partner not be given the details of your procedure findings, then the procedure report has been included in a sealed envelope for you to review at your convenience later.  YOU SHOULD EXPECT: Some feelings of bloating in the abdomen. Passage of more gas than usual.  Walking can help get rid of the air that was put into your GI tract during the procedure and reduce the bloating. If you had a lower endoscopy (such as a colonoscopy or flexible sigmoidoscopy) you may notice spotting of blood in your stool or on the toilet paper. If you underwent a bowel prep for your procedure, you may not have a normal bowel movement for a few days.  Please Note:  You might notice some irritation and congestion in your nose or some drainage.  This is from the oxygen used during your procedure.  There is no need for concern and it should clear up in a day or so.  SYMPTOMS TO REPORT IMMEDIATELY:  Following upper endoscopy (EGD)  Vomiting of blood or coffee ground material  New chest pain or pain under the shoulder blades  Painful or persistently difficult swallowing  New shortness of breath  Fever of 100F or higher  Black, tarry-looking stools  For urgent or emergent issues, a gastroenterologist can be reached at any hour by calling [phone]. Do not use MyChart messaging for urgent concerns.    DIET:  We  do recommend a small meal at first, but then you may proceed to your regular diet.  Drink plenty of fluids but you should avoid alcoholic beverages for 24 hours.  ACTIVITY:  You should plan to take it easy for the rest of today and you should NOT DRIVE or use heavy machinery until tomorrow (because of the sedation medicines used during the test).    FOLLOW UP: Our staff will call the number listed on your records 48-72 hours following your procedure to check on you and address any questions or concerns that you may have regarding the information given to you following your procedure. If we do not reach you, we will leave a message.  We will attempt to reach you two times.  During this call, we will ask if you have developed any symptoms of COVID 19. If you develop any symptoms (ie: fever, flu-like symptoms, shortness of breath, cough etc.) before then, please call [phone].  If you test positive for Covid 19 in the 2 weeks post procedure, please call and report this information to [place].    If any biopsies were taken you will be contacted by phone or by letter within the next 1-3 weeks.  Please call us at [phone] if you have not heard about the biopsies in 3 weeks.    SIGNATURES/CONFIDENTIALITY: You and/or your care partner have signed paperwork which will be entered into your electronic medical record.  These signatures attest  to the fact that that the information above on your After Visit Summary has been reviewed and is understood.  Full responsibility of the confidentiality of this discharge information lies with you and/or your care-partner.

## 2021-04-20 NOTE — Progress Notes (Signed)
Called to room to assist during endoscopic procedure.  Patient ID and intended procedure confirmed with present staff. Received instructions for my participation in the procedure from the performing physician.  

## 2021-04-20 NOTE — Progress Notes (Signed)
Oral CT contrast sent with pt at time of discharge. Fecal elastase kit and instructions given today.  Pt had low BP 70's/40's on arrival to PACU. BP 120/64 at time of discharge. Pt stable.

## 2021-04-21 ENCOUNTER — Other Ambulatory Visit: Payer: Self-pay | Admitting: *Deleted

## 2021-04-21 ENCOUNTER — Ambulatory Visit (INDEPENDENT_AMBULATORY_CARE_PROVIDER_SITE_OTHER): Payer: PPO

## 2021-04-21 DIAGNOSIS — Z0000 Encounter for general adult medical examination without abnormal findings: Secondary | ICD-10-CM | POA: Diagnosis not present

## 2021-04-21 DIAGNOSIS — R1084 Generalized abdominal pain: Secondary | ICD-10-CM

## 2021-04-21 DIAGNOSIS — R634 Abnormal weight loss: Secondary | ICD-10-CM

## 2021-04-21 DIAGNOSIS — R197 Diarrhea, unspecified: Secondary | ICD-10-CM

## 2021-04-21 NOTE — Progress Notes (Addendum)
I connected with Scott Oneal today by telephone and verified that I am speaking with the correct person using two identifiers. Location patient: home Location provider: work Persons participating in the virtual visit: Christian Borgerding and [employer], LPN..   I discussed the limitations, risks, security and privacy concerns of performing an evaluation and management service by telephone and the availability of in person appointments. I also discussed with the patient that there may be a patient responsible charge related to this service. The patient expressed understanding and verbally consented to this telephonic visit.    Interactive audio and video telecommunications were attempted between this provider and patient, however failed, due to patient having technical difficulties OR patient did not have access to video capability.  We continued and completed visit with audio only.  Some vital signs may be absent or patient reported.   Time Spent with patient on telephone encounter: 30 minutes  Subjective:   Scott Oneal is a 80 y.o. male who presents for Medicare Annual/Subsequent preventive examination.  Review of Systems     Cardiac Risk Factors include: advanced age (>81mn, >>56women);dyslipidemia;family history of premature cardiovascular disease;hypertension;male gender     Objective:    There were no vitals filed for this visit. There is no height or weight on file to calculate BMI.  Advanced Directives 04/21/2021 04/20/2020 01/31/2020 04/17/2019 01/08/2019 03/06/2018 06/06/2017  Does Patient Have a Medical Advance Directive? Yes Yes Yes Yes Yes Yes Yes  Type of Advance Directive Living will;Healthcare Power of [place] will Living will;Healthcare Power of [place] will  Does patient want to make changes to medical advance directive? No - Patient declined No -  Patient declined No - Patient declined - No - Patient declined - -  Copy of HCannon Ballin Chart? No - copy requested - - No - copy requested No - copy requested No - copy requested -    Current Medications (verified) Outpatient Encounter Medications as of 04/21/2021  Medication Sig   Ascorbic Acid (VITAMIN C PO) Take 1 tablet by mouth every other day.   aspirin 81 MG tablet Take 81 mg by mouth every evening.    B Complex Vitamins (B COMPLEX PO) Take by mouth. Take 1 tablet three times a day   calcitRIOL (ROCALTROL) 0.25 MCG capsule TAKE 1 CAPSULE BY MOUTH EVERY DAY   carvedilol (COREG) 25 MG tablet TAKE 1 TABLET BY MOUTH TWICE A DAY WITH MEALS   cetirizine (ZYRTEC) 10 MG tablet Take 10 mg by mouth daily as needed for allergies.   Cholecalciferol 2000 UNITS TABS Take 2,000 Units by mouth daily.    clonazePAM (KLONOPIN) 0.25 MG disintegrating tablet TAKE 1 TABLET (0.25 MG TOTAL) BY MOUTH 2 (TWO) TIMES DAILY AS NEEDED (ANXIETY).   diclofenac sodium (VOLTAREN) 1 % GEL APPLY 4 G TOPICALLY 4 TIMES DAILY.   hydrALAZINE (APRESOLINE) 10 MG tablet Take 1-2 tablet 3 times a day as needed if blood pressure is >170   isosorbide mononitrate (IMDUR) 30 MG 24 hr tablet TAKE 1 TABLET BY MOUTH EVERY DAY   levothyroxine (SYNTHROID) 175 MCG tablet Take 1 tablet (175 mcg total) by mouth daily.   Multiple Vitamin (MULTIVITAMIN) capsule Take 1 capsule by mouth daily.   Multiple Vitamins-Minerals (PRESERVISION AREDS 2) CAPS Take 1 capsule by mouth in the morning and at bedtime.   nitroGLYCERIN (NITROSTAT) 0.4 MG SL tablet Place 0.4 mg  under the tongue every 5 (five) minutes as needed for chest pain.   olmesartan (BENICAR) 5 MG tablet TAKE 1 TABLET BY MOUTH EVERY DAY   pantoprazole (PROTONIX) 40 MG tablet Take 1 tablet (40 mg total) by mouth daily.   Propylene Glycol (SYSTANE BALANCE) 0.6 % SOLN Place 1 drop into both eyes daily as needed (Dry eye). systane balance   saccharomyces boulardii  (FLORASTOR) 250 MG capsule Take 1 capsule (250 mg total) by mouth 2 (two) times daily.   simvastatin (ZOCOR) 10 MG tablet TAKE 1 TABLET BY MOUTH EVERYDAY AT BEDTIME   SYRINGE-NEEDLE, DISP, 3 ML (BD ECLIPSE SYRINGE) 23G X 1" 3 ML MISC As directed IM   tamsulosin (FLOMAX) 0.4 MG CAPS capsule Take 0.4 mg by mouth.   testosterone cypionate (DEPOTESTOSTERONE CYPIONATE) 200 MG/ML injection INJECT 2ML INTO MUSCLE EVERY 2 WEEKS   TURMERIC PO Take 1 capsule by mouth 3 (three) times daily.   UNABLE TO FIND Med Name: [facility] take 2 tablets by mouth daily   No facility-administered encounter medications on file as of 04/21/2021.    Allergies (verified) Biaxin [clarithromycin], Allantoin, Metoprolol, Tizanidine, Allantoin-pramoxine, Amlodipine, Benzalkonium chloride, and Metoprolol tartrate   History: Past Medical History:  Diagnosis Date   Allergy    Anxiety    Arthritis    Asthma    Benign neoplasm of colon    Calculus of gallbladder without mention of cholecystitis    Cataract    bilateral cateracts removed   Celiac disease    Cholelithiasis    Coronary atherosclerosis of unspecified type of vessel, native or graft    Diarrhea    Esophageal reflux    Glaucoma    Gout, unspecified    Hyperparathyroidism    Long term (current) use of anticoagulants    Loss of weight    Old myocardial infarction    Osteoporosis, unspecified    Other malaise and fatigue    Other specified cardiac dysrhythmias(427.89)    Other testicular hypofunction    Personal history of colonic polyps    Pure hypercholesterolemia    Renal insufficiency    chronic   Unspecified asthma(493.90)    Unspecified essential hypertension    Unspecified hypothyroidism    Past Surgical History:  Procedure Laterality Date   cataract surgery Bilateral    COLONOSCOPY  06/26/2017   [facility]   COLONOSCOPY WITH PROPOFOL N/A 11/27/2014   Procedure: COLONOSCOPY WITH PROPOFOL;  Surgeon: Milus Banister, MD;  Location: [place];  Service: Endoscopy;  Laterality: N/A;   CORONARY ANGIOPLASTY     CORONARY STENT PLACEMENT  2011   Cypher; in distal circumflex artery   CYSTOSCOPY  1998   EYE SURGERY     KNEE SURGERY     right   LEFT HEART CATH AND CORONARY ANGIOGRAPHY N/A 01/31/2020   Procedure: LEFT HEART CATH AND CORONARY ANGIOGRAPHY;  Surgeon: Belva Crome, MD;  Location: [place] [facility];  Service: Cardiovascular;  Laterality: N/A;   SHOULDER SURGERY Left 9/14   Dr Shara Blazing   UPPER GASTROINTESTINAL ENDOSCOPY     VASCULAR SURGERY     Family History  Problem Relation Age of Onset   Lymphoma Father    Hypertension Other    Vision loss Maternal Grandmother    Colon cancer Son 73   Asthma Neg Hx    Esophageal cancer Neg Hx    Rectal cancer Neg Hx    Stomach cancer Neg Hx    Colon polyps Neg Hx  Social History   Socioeconomic History   Marital status: Married    Spouse name: Mare Ludtke   Number of children: 3   Years of education: Not on file   Highest education level: Not on file  Occupational History   Occupation: retired    Fish farm manager: RETIRED    Comment: Engineering  Tobacco Use   Smoking status: Never   Smokeless tobacco: Never  Vaping Use   Vaping Use: Never used  Substance and Sexual Activity   Alcohol use: No   Drug use: No   Sexual activity: Yes  Other Topics Concern   Not on file  Social History Narrative   Retired.   Regular Exercise-Yes; yoga & silver sneakers   Daily Caffeine Use.            Social Determinants of Health   Financial Resource Strain: Low Risk    Difficulty of Paying Living Expenses: Not hard at all  Food Insecurity: No Food Insecurity   Worried About Charity fundraiser in the Last Year: Never true   [place] in the Last Year: Never true  Transportation Needs: No Transportation Needs   Lack of Transportation (Medical): No   Lack of Transportation (Non-Medical): No  Physical Activity: Sufficiently Active   Days of Exercise per Week:  5 days   Minutes of Exercise per Session: 30 min  Stress: No Stress Concern Present   Feeling of Stress : Not at all  Social Connections: Socially Integrated   Frequency of Communication with Friends and Family: More than three times a week   Frequency of Social Gatherings with Friends and Family: More than three times a week   Attends Religious Services: More than 4 times per year   Active Member of [employer] or Organizations: Yes   Attends Music therapist: More than 4 times per year   Marital Status: Married    Tobacco Counseling Counseling given: Not Answered   Clinical Intake:  Pre-visit preparation completed: Yes  Pain : No/denies pain     Nutritional Risks: Nausea/ vomitting/ diarrhea Diabetes: No  How often do you need to have someone help you when you read instructions, pamphlets, or other written materials from your doctor or pharmacy?: 1 - Never What is the last grade level you completed in school?: Master's Degree from [employer])  Diabetic? no  Interpreter Needed?: No  Information entered by :: Lisette Abu, LPN   Activities of Daily Living In your present state of health, do you have any difficulty performing the following activities: 04/21/2021  Hearing? Y  Comment wears hearing aids  Vision? N  Difficulty concentrating or making decisions? N  Walking or climbing stairs? N  Dressing or bathing? N  Doing errands, shopping? N  Preparing Food and eating ? N  Using the Toilet? N  In the past six months, have you accidently leaked urine? N  Do you have problems with loss of bowel control? N  Managing your Medications? N  Managing your Finances? N  Housekeeping or managing your Housekeeping? N  Some recent data might be hidden    Patient Care Team: Plotnikov, Evie Lacks, MD as PCP - Cyndia Diver, MD as PCP - Cardiology (Cardiology) Elsie Stain, MD as Consulting Physician (Pulmonary Disease) Ladene Artist, MD as Consulting Physician (Gastroenterology) Franchot Gallo, MD as Consulting Physician (Urology) Corliss Parish, MD as Consulting Physician (Nephrology) Sherren Mocha, MD as Consulting Physician (Cardiology) Loleta Books, MD  as Consulting Physician (Ophthalmology) Alanda Slim, Neena Rhymes, MD as Consulting Physician (Ophthalmology)  Indicate any recent Medical Services you may have received from other than [facility] providers in the past year (date may be approximate).     Assessment:   This is a routine wellness examination for [place].  Hearing/Vision screen Hearing Screening - Comments:: Patient wears hearing aids. Vision Screening - Comments:: Patient was last seen by Dr Helmut Muster 04/20/2021.  Dietary issues and exercise activities discussed: Current Exercise Habits: Structured exercise class, Type of exercise: yoga;walking;treadmill;stretching;strength training/weights, Time (Minutes): 30, Frequency (Times/Week): 5, Weekly Exercise (Minutes/Week): 150, Intensity: Moderate, Exercise limited by: cardiac condition(s)   Goals Addressed   None   Depression Screen PHQ 2/9 Scores 04/21/2021 04/20/2020 04/17/2019 03/06/2018 10/23/2017 10/23/2017 02/21/2017  PHQ - 2 Score 0 0 0 1 0 0 0  PHQ- 9 Score - - - 1 - - 2    Fall Risk Fall Risk  04/21/2021 04/20/2020 04/17/2019 03/06/2018 10/23/2017  Falls in the past year? 0 0 0 Yes No  Number falls in past yr: 0 0 0 1 -  Injury with Fall? 0 0 - - -  Risk for fall due to : No Fall Risks No Fall Risks Impaired balance/gait - -  Follow up Falls evaluation completed Falls evaluation completed;Education provided Falls prevention discussed Falls prevention discussed -    FALL RISK PREVENTION PERTAINING TO THE HOME:  Any stairs in or around the home? Yes  If so, are there any without handrails? No  Home free of loose throw rugs in walkways, pet beds, electrical cords, etc? Yes  Adequate lighting in your home to reduce risk of falls? Yes    ASSISTIVE DEVICES UTILIZED TO PREVENT FALLS:  Life alert? No  Use of a cane, walker or w/c? No  Grab bars in the bathroom? Yes  Shower chair or bench in shower? No  Elevated toilet seat or a handicapped toilet? Yes   TIMED UP AND GO:  Was the test performed? No .  Length of time to ambulate 10 feet: 0 sec.   Gait steady and fast without use of assistive device (per patient)  Cognitive Function: Normal cognitive status assessed by direct observation by this Nurse Health Advisor. No abnormalities found.   MMSE - Mini Mental State Exam 03/06/2018  Orientation to time 5  Orientation to Place 5  Registration 3  Attention/ Calculation 5  Recall 2  Language- name 2 objects 2  Language- repeat 1  Language- follow 3 step command 3  Language- read & follow direction 1  Write a sentence 1  Copy design 1  Total score 29     6CIT Screen 04/20/2020  What Year? 0 points  What month? 0 points  What time? 0 points  Count back from 20 0 points  Months in reverse 0 points  Repeat phrase 0 points  Total Score 0    Immunizations Immunization History  Administered Date(s) Administered   Fluad Quad(high Dose 65+) 07/18/2019, 07/28/2020   Influenza Split 07/27/2011, 08/07/2012   Influenza Whole 10/04/2002, 07/30/2008, 08/12/2009, 07/07/2010   Influenza, High Dose Seasonal PF 08/07/2015, 08/01/2016, 07/20/2017, 06/29/2018   Influenza,inj,Quad PF,6+ Mos 07/17/2013, 07/21/2014   Influenza-Unspecified 06/29/2018   PFIZER Comirnaty(Gray Top)Covid-19 Tri-Sucrose Vaccine 02/18/2021   PFIZER(Purple Top)SARS-COV-2 Vaccination 11/20/2019, 12/11/2019, 08/17/2020   Pneumococcal Conjugate-13 09/30/2013   Pneumococcal Polysaccharide-23 05/15/2006, 12/12/2012   Td 11/24/2010   Zoster Recombinat (Shingrix) 03/02/2017, 06/08/2017   Zoster, Live 07/11/2006    TDAP status: Up to  date  Flu Vaccine status: Up to date  Pneumococcal vaccine status: Up to date  Covid-19 vaccine status: Completed  vaccines  Qualifies for Shingles Vaccine? Yes   Zostavax completed Yes   Shingrix Completed?: Yes  Screening Tests Health Maintenance  Topic Date Due   Hepatitis C Screening  Never done   TETANUS/TDAP  11/24/2020   INFLUENZA VACCINE  05/31/2021   COVID-19 Vaccine (5 - Booster for Pfizer series) 06/20/2021   PNA vac Low Risk Adult  Completed   Zoster Vaccines- Shingrix  Completed   HPV VACCINES  Aged Out    Health Maintenance  Health Maintenance Due  Topic Date Due   Hepatitis C Screening  Never done   TETANUS/TDAP  11/24/2020    Colorectal cancer screening: No longer required.   Lung Cancer Screening: (Low Dose CT Chest recommended if Age 19-80 years, 30 pack-year currently smoking OR have quit w/in 15years.) does not qualify.   Lung Cancer Screening Referral: no  Additional Screening:  Hepatitis C Screening: does qualify; Completed no  Vision Screening: Recommended annual ophthalmology exams for early detection of glaucoma and other disorders of the eye. Is the patient up to date with their annual eye exam?  Yes  Who is the provider or what is the name of the office in which the patient attends annual eye exams? Julian Reil, MD. If pt is not established with a provider, would they like to be referred to a provider to establish care? No .   Dental Screening: Recommended annual dental exams for proper oral hygiene  Community Resource Referral / Chronic Care Management: CRR required this visit?  No   CCM required this visit?  No      Plan:     I have personally reviewed and noted the following in the patient's chart:   Medical and social history Use of alcohol, tobacco or illicit drugs  Current medications and supplements including opioid prescriptions. Patient is not currently taking opioid prescriptions. Functional ability and status Nutritional status Physical activity Advanced directives List of other physicians Hospitalizations, surgeries, and ER  visits in previous 12 months Vitals Screenings to include cognitive, depression, and falls Referrals and appointments  In addition, I have reviewed and discussed with patient certain preventive protocols, quality metrics, and best practice recommendations. A written personalized care plan for preventive services as well as general preventive health recommendations were provided to patient.     Sheral Flow, LPN   01/26/9241   Nurse Notes:  Patient is cogitatively intact. There were no vitals filed for this visit. There is no height or weight on file to calculate BMI. Patient stated that he has no issues with gait or balance; does not use any assistive devices.  Medical screening examination/treatment/procedure(s) were performed by non-physician practitioner and as supervising physician I was immediately available for consultation/collaboration.  I agree with above. Lew Dawes, MD

## 2021-04-21 NOTE — Patient Instructions (Signed)
Scott Oneal , Thank you for taking time to come for your Medicare Wellness Visit. I appreciate your ongoing commitment to your health goals. Please review the following plan we discussed and let me know if I can assist you in the future.   Screening recommendations/referrals: Colonoscopy: 11/27/2020; no repeat due to age Recommended yearly ophthalmology/optometry visit for glaucoma screening and checkup Recommended yearly dental visit for hygiene and checkup  Vaccinations: Influenza vaccine: 07/28/2020 Pneumococcal vaccine: 12/12/2012, 09/30/2013 Tdap vaccine: 11/24/2010; overdue Shingles vaccine: 11/20/2019, 12/11/2019, 08/17/2020, 02/18/2021   Covid-19:  Advanced directives: Please bring a copy of your health care power of attorney and living will to the office at your convenience.  Conditions/risks identified: Stay as healthy and as independent as possible. Enjoy life, family, continue to go on our annual beach trip with my family.  Next appointment: 04/22/2022 at 1:15 pm phone visit   Preventive Care 45 Years and Older, Male Preventive care refers to lifestyle choices and visits with your health care provider that can promote health and wellness. What does preventive care include? A yearly physical exam. This is also called an annual well check. Dental exams once or twice a year. Routine eye exams. Ask your health care provider how often you should have your eyes checked. Personal lifestyle choices, including: Daily care of your teeth and gums. Regular physical activity. Eating a healthy diet. Avoiding tobacco and drug use. Limiting alcohol use. Practicing safe sex. Taking low doses of aspirin every day. Taking vitamin and mineral supplements as recommended by your health care provider. What happens during an annual well check? The services and screenings done by your health care provider during your annual well check will depend on your age, overall health, lifestyle risk factors, and  family history of disease. Counseling  Your health care provider may ask you questions about your: Alcohol use. Tobacco use. Drug use. Emotional well-being. Home and relationship well-being. Sexual activity. Eating habits. History of falls. Memory and ability to understand (cognition). Work and work Statistician. Screening  You may have the following tests or measurements: Height, weight, and BMI. Blood pressure. Lipid and cholesterol levels. These may be checked every 5 years, or more frequently if you are over 10 years old. Skin check. Lung cancer screening. You may have this screening every year starting at age 72 if you have a 30-pack-year history of smoking and currently smoke or have quit within the past 15 years. Fecal occult blood test (FOBT) of the stool. You may have this test every year starting at age 30. Flexible sigmoidoscopy or colonoscopy. You may have a sigmoidoscopy every 5 years or a colonoscopy every 10 years starting at age 85. Prostate cancer screening. Recommendations will vary depending on your family history and other risks. Hepatitis C blood test. Hepatitis B blood test. Sexually transmitted disease (STD) testing. Diabetes screening. This is done by checking your blood sugar (glucose) after you have not eaten for a while (fasting). You may have this done every 1-3 years. Abdominal aortic aneurysm (AAA) screening. You may need this if you are a current or former smoker. Osteoporosis. You may be screened starting at age 29 if you are at high risk. Talk with your health care provider about your test results, treatment options, and if necessary, the need for more tests. Vaccines  Your health care provider may recommend certain vaccines, such as: Influenza vaccine. This is recommended every year. Tetanus, diphtheria, and acellular pertussis (Tdap, Td) vaccine. You may need a Td booster every 10  years. Zoster vaccine. You may need this after age 37. Pneumococcal  13-valent conjugate (PCV13) vaccine. One dose is recommended after age 36. Pneumococcal polysaccharide (PPSV23) vaccine. One dose is recommended after age 85. Talk to your health care provider about which screenings and vaccines you need and how often you need them. This information is not intended to replace advice given to you by your health care provider. Make sure you discuss any questions you have with your health care provider. Document Released: 11/13/2015 Document Revised: 07/06/2016 Document Reviewed: 08/18/2015 Elsevier Interactive Patient Education  2017 [place] Prevention in the Home Falls can cause injuries. They can happen to people of all ages. There are many things you can do to make your home safe and to help prevent falls. What can I do on the outside of my home? Regularly fix the edges of walkways and driveways and fix any cracks. Remove anything that might make you trip as you walk through a door, such as a raised step or threshold. Trim any bushes or trees on the path to your home. Use bright outdoor lighting. Clear any walking paths of anything that might make someone trip, such as rocks or tools. Regularly check to see if handrails are loose or broken. Make sure that both sides of any steps have handrails. Any raised decks and porches should have guardrails on the edges. Have any leaves, snow, or ice cleared regularly. Use sand or salt on walking paths during winter. Clean up any spills in your garage right away. This includes oil or grease spills. What can I do in the bathroom? Use night lights. Install grab bars by the toilet and in the tub and shower. Do not use towel bars as grab bars. Use non-skid mats or decals in the tub or shower. If you need to sit down in the shower, use a plastic, non-slip stool. Keep the floor dry. Clean up any water that spills on the floor as soon as it happens. Remove soap buildup in the tub or shower regularly. Attach  bath mats securely with double-sided non-slip rug tape. Do not have throw rugs and other things on the floor that can make you trip. What can I do in the bedroom? Use night lights. Make sure that you have a light by your bed that is easy to reach. Do not use any sheets or blankets that are too big for your bed. They should not hang down onto the floor. Have a firm chair that has side arms. You can use this for support while you get dressed. Do not have throw rugs and other things on the floor that can make you trip. What can I do in the kitchen? Clean up any spills right away. Avoid walking on wet floors. Keep items that you use a lot in easy-to-reach places. If you need to reach something above you, use a strong step stool that has a grab bar. Keep electrical cords out of the way. Do not use floor polish or wax that makes floors slippery. If you must use wax, use non-skid floor wax. Do not have throw rugs and other things on the floor that can make you trip. What can I do with my stairs? Do not leave any items on the stairs. Make sure that there are handrails on both sides of the stairs and use them. Fix handrails that are broken or loose. Make sure that handrails are as long as the stairways. Check any carpeting to make sure  that it is firmly attached to the stairs. Fix any carpet that is loose or worn. Avoid having throw rugs at the top or bottom of the stairs. If you do have throw rugs, attach them to the floor with carpet tape. Make sure that you have a light switch at the top of the stairs and the bottom of the stairs. If you do not have them, ask someone to add them for you. What else can I do to help prevent falls? Wear shoes that: Do not have high heels. Have rubber bottoms. Are comfortable and fit you well. Are closed at the toe. Do not wear sandals. If you use a stepladder: Make sure that it is fully opened. Do not climb a closed stepladder. Make sure that both sides of the  stepladder are locked into place. Ask someone to hold it for you, if possible. Clearly mark and make sure that you can see: Any grab bars or handrails. First and last steps. Where the edge of each step is. Use tools that help you move around (mobility aids) if they are needed. These include: Canes. Walkers. Scooters. Crutches. Turn on the lights when you go into a dark area. Replace any light bulbs as soon as they burn out. Set up your furniture so you have a clear path. Avoid moving your furniture around. If any of your floors are uneven, fix them. If there are any pets around you, be aware of where they are. Review your medicines with your doctor. Some medicines can make you feel dizzy. This can increase your chance of falling. Ask your doctor what other things that you can do to help prevent falls. This information is not intended to replace advice given to you by your health care provider. Make sure you discuss any questions you have with your health care provider. Document Released: 08/13/2009 Document Revised: 03/24/2016 Document Reviewed: 11/21/2014 Elsevier Interactive Patient Education  2017 [employer].

## 2021-04-22 ENCOUNTER — Other Ambulatory Visit: Payer: PPO

## 2021-04-22 ENCOUNTER — Telehealth: Payer: Self-pay

## 2021-04-22 ENCOUNTER — Telehealth: Payer: Self-pay | Admitting: *Deleted

## 2021-04-22 DIAGNOSIS — R197 Diarrhea, unspecified: Secondary | ICD-10-CM | POA: Diagnosis not present

## 2021-04-22 NOTE — Telephone Encounter (Signed)
Called # [phone] and left a message we tried to reach pt for a follow up call. maw

## 2021-04-22 NOTE — Telephone Encounter (Signed)
  Follow up Call-  Call back number 04/20/2021 11/27/2020  Post procedure Call Back phone  # [phone]  Permission to leave phone message Yes Yes  Some recent data might be hidden     Patient questions:  Do you have a fever, pain , or abdominal swelling? No. Pain Score  0 *  Have you tolerated food without any problems? Yes.    Have you been able to return to your normal activities? Yes.    Do you have any questions about your discharge instructions: Diet   No. Medications  No. Follow up visit  No.  Do you have questions or concerns about your Care? No.  Actions: * If pain score is 4 or above: No action needed, pain <4.Marland KitchenHave you developed a fever since your procedure? no  2.   Have you had an respiratory symptoms (SOB or cough) since your procedure? no  3.   Have you tested positive for COVID 19 since your procedure no  4.   Have you had any family members/close contacts diagnosed with the COVID 19 since your procedure?  no   If yes to any of these questions please route to Joylene John, RN and Joella Prince, RN

## 2021-04-28 LAB — PANCREATIC ELASTASE, FECAL: Pancreatic Elastase-1, Stool: 93 mcg/g — ABNORMAL LOW

## 2021-04-29 ENCOUNTER — Other Ambulatory Visit: Payer: Self-pay

## 2021-04-29 ENCOUNTER — Telehealth: Payer: Self-pay | Admitting: *Deleted

## 2021-04-29 ENCOUNTER — Ambulatory Visit (HOSPITAL_COMMUNITY)
Admission: RE | Admit: 2021-04-29 | Discharge: 2021-04-29 | Disposition: A | Discharge status: Home / Self Care | Payer: PPO | Source: Ambulatory Visit | Attending: Gastroenterology | Admitting: Gastroenterology

## 2021-04-29 ENCOUNTER — Other Ambulatory Visit: Payer: Self-pay | Admitting: *Deleted

## 2021-04-29 DIAGNOSIS — N200 Calculus of kidney: Secondary | ICD-10-CM | POA: Diagnosis not present

## 2021-04-29 DIAGNOSIS — R634 Abnormal weight loss: Secondary | ICD-10-CM | POA: Diagnosis not present

## 2021-04-29 DIAGNOSIS — A09 Infectious gastroenteritis and colitis, unspecified: Secondary | ICD-10-CM

## 2021-04-29 DIAGNOSIS — R197 Diarrhea, unspecified: Secondary | ICD-10-CM | POA: Diagnosis not present

## 2021-04-29 DIAGNOSIS — K8020 Calculus of gallbladder without cholecystitis without obstruction: Secondary | ICD-10-CM | POA: Diagnosis not present

## 2021-04-29 NOTE — Telephone Encounter (Signed)
Spoke with Cleatrice Burke, order corrected for CT Abd/Pelvis without contrast. Nothing further needed at this time.

## 2021-04-29 NOTE — Telephone Encounter (Signed)
As AM Doctor of the Day can you please advise.  [facility] called and patient listed above has CT scan scheduled today at 2pm. Pts Cr is 2.25 and GFR 27.04. Requesting order for patient's CT scan to be ordered WITHOUT contrast.  Thank You

## 2021-04-29 NOTE — Telephone Encounter (Signed)
Can you please advise, Dr Lyndel Safe was Doc of the Day this morning has not responded patient has CT at 2pm today. Order needed for CT WITHOUT contrast d/t Cr 2.25 and GFR 27.04.  Thank You

## 2021-05-03 ENCOUNTER — Encounter: Payer: Self-pay | Admitting: Gastroenterology

## 2021-05-04 ENCOUNTER — Other Ambulatory Visit: Payer: Self-pay

## 2021-05-04 DIAGNOSIS — K828 Other specified diseases of gallbladder: Secondary | ICD-10-CM

## 2021-05-04 DIAGNOSIS — K8020 Calculus of gallbladder without cholecystitis without obstruction: Secondary | ICD-10-CM

## 2021-05-05 ENCOUNTER — Other Ambulatory Visit: Payer: Self-pay

## 2021-05-05 MED ORDER — PANCRELIPASE (LIP-PROT-AMYL) 36000-114000 UNITS PO CPEP: ORAL_CAPSULE | ORAL | 11 refills | Status: DC

## 2021-05-07 ENCOUNTER — Ambulatory Visit (INDEPENDENT_AMBULATORY_CARE_PROVIDER_SITE_OTHER): Payer: PPO | Admitting: Internal Medicine

## 2021-05-07 ENCOUNTER — Telehealth: Payer: Self-pay | Admitting: Internal Medicine

## 2021-05-07 ENCOUNTER — Other Ambulatory Visit: Payer: Self-pay

## 2021-05-07 ENCOUNTER — Encounter: Payer: Self-pay | Admitting: Internal Medicine

## 2021-05-07 DIAGNOSIS — N1832 Chronic kidney disease, stage 3b: Secondary | ICD-10-CM

## 2021-05-07 DIAGNOSIS — L03119 Cellulitis of unspecified part of limb: Secondary | ICD-10-CM | POA: Diagnosis not present

## 2021-05-07 DIAGNOSIS — I25709 Atherosclerosis of coronary artery bypass graft(s), unspecified, with unspecified angina pectoris: Secondary | ICD-10-CM

## 2021-05-07 MED ORDER — DOXYCYCLINE HYCLATE 100 MG PO TABS: 100.0000 mg | ORAL_TABLET | Freq: Two times a day (BID) | ORAL | 0 refills | Status: DC

## 2021-05-07 MED ORDER — NUZYRA 150 MG PO TABS: ORAL_TABLET | ORAL | 0 refills | Status: DC

## 2021-05-07 MED ORDER — TRAMADOL HCL 50 MG PO TABS: 50.0000 mg | ORAL_TABLET | Freq: Four times a day (QID) | ORAL | 0 refills | Status: AC | PRN

## 2021-05-07 NOTE — Telephone Encounter (Signed)
   [facility] called and said that Omadacycline Tosylate (NUZYRA) 150 MG TABS  requires a PA. Please advise

## 2021-05-07 NOTE — Progress Notes (Signed)
Subjective:  Patient ID: Scott Oneal, male    DOB: [birth year]  Age: 80 y.o. MRN: [id number]  CC: Follow-up   HPI Scott Oneal presents for a new relapse of LLE redness, pain, swelling. [place] and he fell twice because LLE gave way. Pain in the leg kept him awake last night.  The redness it is located over anterior mid to distal tibia and spreading.  Outpatient Medications Prior to Visit  Medication Sig Dispense Refill   Ascorbic Acid (VITAMIN C PO) Take 1 tablet by mouth every other day.     aspirin 81 MG tablet Take 81 mg by mouth every evening.      B Complex Vitamins (B COMPLEX PO) Take by mouth. Take 1 tablet three times a day     calcitRIOL (ROCALTROL) 0.25 MCG capsule TAKE 1 CAPSULE BY MOUTH EVERY DAY 90 capsule 1   carvedilol (COREG) 25 MG tablet TAKE 1 TABLET BY MOUTH TWICE A DAY WITH MEALS 180 tablet 3   cetirizine (ZYRTEC) 10 MG tablet Take 10 mg by mouth daily as needed for allergies.     Cholecalciferol 2000 UNITS TABS Take 2,000 Units by mouth daily.      clonazePAM (KLONOPIN) 0.25 MG disintegrating tablet TAKE 1 TABLET (0.25 MG TOTAL) BY MOUTH 2 (TWO) TIMES DAILY AS NEEDED (ANXIETY). 60 tablet 3   diclofenac sodium (VOLTAREN) 1 % GEL APPLY 4 G TOPICALLY 4 TIMES DAILY. 200 g 5   hydrALAZINE (APRESOLINE) 10 MG tablet Take 1-2 tablet 3 times a day as needed if blood pressure is >170 60 tablet 3   isosorbide mononitrate (IMDUR) 30 MG 24 hr tablet TAKE 1 TABLET BY MOUTH EVERY DAY 90 tablet 2   levothyroxine (SYNTHROID) 175 MCG tablet Take 1 tablet (175 mcg total) by mouth daily. 90 tablet 3   lipase/protease/amylase (CREON) 36000 UNITS CPEP capsule Take 2 capsules (72,000 Units total) by mouth 3 (three) times daily with meals. May also take 1 capsule (36,000 Units total) as needed (with snacks). 240 capsule 11   Multiple Vitamin (MULTIVITAMIN) capsule Take 1 capsule by mouth daily.     Multiple Vitamins-Minerals (PRESERVISION AREDS 2) CAPS Take 1 capsule by  mouth in the morning and at bedtime.     nitroGLYCERIN (NITROSTAT) 0.4 MG SL tablet Place 0.4 mg under the tongue every 5 (five) minutes as needed for chest pain.     pantoprazole (PROTONIX) 40 MG tablet Take 1 tablet (40 mg total) by mouth daily. 30 tablet 11   Propylene Glycol (SYSTANE BALANCE) 0.6 % SOLN Place 1 drop into both eyes daily as needed (Dry eye). systane balance     saccharomyces boulardii (FLORASTOR) 250 MG capsule Take 1 capsule (250 mg total) by mouth 2 (two) times daily. 60 capsule 1   simvastatin (ZOCOR) 10 MG tablet TAKE 1 TABLET BY MOUTH EVERYDAY AT BEDTIME 90 tablet 3   SYRINGE-NEEDLE, DISP, 3 ML (BD ECLIPSE SYRINGE) 23G X 1" 3 ML MISC As directed IM 50 each 2   tamsulosin (FLOMAX) 0.4 MG CAPS capsule Take 0.4 mg by mouth.     testosterone cypionate (DEPOTESTOSTERONE CYPIONATE) 200 MG/ML injection INJECT 2ML INTO MUSCLE EVERY 2 WEEKS 12 mL 1   TURMERIC PO Take 1 capsule by mouth 3 (three) times daily.     UNABLE TO FIND Med Name: [facility] take 2 tablets by mouth daily     olmesartan (BENICAR) 5 MG tablet TAKE 1 TABLET BY MOUTH EVERY DAY (  Patient not taking: Reported on 05/07/2021) 90 tablet 3   No facility-administered medications prior to visit.    ROS: Review of Systems  Constitutional:  Positive for fatigue. Negative for appetite change, fever and unexpected weight change.  HENT:  Negative for congestion, nosebleeds, sneezing, sore throat and trouble swallowing.   Eyes:  Negative for itching and visual disturbance.  Respiratory:  Negative for cough.   Cardiovascular:  Negative for chest pain, palpitations and leg swelling.  Gastrointestinal:  Negative for abdominal distention, blood in stool, diarrhea and nausea.  Genitourinary:  Negative for frequency and hematuria.  Musculoskeletal:  Positive for gait problem. Negative for back pain, joint swelling and neck pain.  Skin:  Positive for color change. Negative for rash.  Neurological:  Positive for weakness.  Negative for dizziness, tremors and speech difficulty.  Hematological:  Bruises/bleeds easily.  Psychiatric/Behavioral:  Negative for agitation, dysphoric mood and sleep disturbance. The patient is not nervous/anxious.    Objective:  BP (!) 142/86   Pulse 68   Temp 98.1 F (36.7 C) (Oral)   Ht 6' (1.829 m)   Wt 185 lb (83.9 kg)   SpO2 98%   BMI 25.09 kg/m   BP Readings from Last 3 Encounters:  05/07/21 (!) 142/86  04/20/21 120/64  04/14/21 132/70    Wt Readings from Last 3 Encounters:  05/07/21 185 lb (83.9 kg)  04/20/21 185 lb (83.9 kg)  04/14/21 182 lb 9.6 oz (82.8 kg)    Physical Exam Constitutional:      General: He is not in acute distress.    Appearance: He is well-developed.     Comments: NAD  Eyes:     Conjunctiva/sclera: Conjunctivae normal.     Pupils: Pupils are equal, round, and reactive to light.  Neck:     Thyroid: No thyromegaly.     Vascular: No JVD.  Cardiovascular:     Rate and Rhythm: Normal rate and regular rhythm.     Heart sounds: Normal heart sounds. No murmur heard.   No friction rub. No gallop.  Pulmonary:     Effort: Pulmonary effort is normal. No respiratory distress.     Breath sounds: Normal breath sounds. No wheezing or rales.  Chest:     Chest wall: No tenderness.  Abdominal:     General: Bowel sounds are normal. There is no distension.     Palpations: Abdomen is soft. There is no mass.     Tenderness: There is no abdominal tenderness. There is no guarding or rebound.  Musculoskeletal:        General: No tenderness. Normal range of motion.     Cervical back: Normal range of motion.  Lymphadenopathy:     Cervical: No cervical adenopathy.  Skin:    General: Skin is warm and dry.     Findings: No rash.  Neurological:     Mental Status: He is alert and oriented to person, place, and time.     Cranial Nerves: No cranial nerve deficit.     Motor: No abnormal muscle tone.     Coordination: Coordination normal.     Gait: Gait  normal.     Deep Tendon Reflexes: Reflexes are normal and symmetric.  Psychiatric:        Behavior: Behavior normal.        Thought Content: Thought content normal.        Judgment: Judgment normal.  LLE - shin w/erythema, pain - worse The redness it is located over anterior  mid to distal tibia and it is warm and painful Trace edema of both ankles present   Lab Results  Component Value Date   WBC 6.6 04/13/2021   HGB 15.2 04/13/2021   HCT 45.3 04/13/2021   PLT 162.0 04/13/2021   GLUCOSE 81 04/15/2021   CHOL 115 02/18/2020   TRIG 115 02/18/2020   HDL 34 (L) 02/18/2020   LDLCALC 60 02/18/2020   ALT 23 04/15/2021   AST 25 04/15/2021   NA 139 04/15/2021   K 4.8 04/15/2021   CL 103 04/15/2021   CREATININE 2.25 (H) 04/15/2021   BUN 18 04/15/2021   CO2 29 04/15/2021   TSH 11.04 (H) 04/15/2021   PSA 2.08 08/24/2020   INR 1.20 11/26/2014   HGBA1C 5.9 03/15/2016   MICROALBUR 1.2 05/18/2017    CT ABDOMEN PELVIS WO CONTRAST  Addendum Date: 04/30/2021   ADDENDUM REPORT: 04/30/2021 22:41 ADDENDUM: PLEASE DISREGARD ADRENALS/URINARY TRACT IN FINDINGS ABOVE. THIS SHOULD READ AS FOLLOWS: Adrenals/Urinary Tract: No adrenal nodule bilaterally. Bilateral calcified RENAL STONES measuring up to 11 mm on the right and 81m on the left centered within the renal pelvis. There is a 3 mm calcified RENAL STONES within the proximal left ureter. No hydronephrosis BILATERALLY. No contour-deforming renal mass. No RIGHT ureterolithiasis.  NO hydroureter BILATERALLY. The urinary bladder is unremarkable. Electronically Signed   By: MIven FinnM.D.   On: 04/30/2021 22:41   Result Date: 04/30/2021 CLINICAL DATA:  Diarrhea, weight loss EXAM: CT ABDOMEN AND PELVIS WITHOUT CONTRAST TECHNIQUE: Multidetector CT imaging of the abdomen and pelvis was performed following the standard protocol without IV contrast. COMPARISON:  None. FINDINGS: Lower chest: Linear atelectasis versus scarring of left lower lobe. Coronary  artery calcifications. Severe mitral annular calcifications. Hepatobiliary: Punctate calcifications of the hepatic parenchyma suggestive of sequelae of prior granulomatous disease. No focal liver abnormality. Gallstones are noted within the gallbladder lumen. Circumferential gallbladder wall thickening. No definite pericholecystic fluid. No biliary dilatation. Pancreas: No focal lesion. Normal pancreatic contour. No surrounding inflammatory changes. No main pancreatic ductal dilatation. Spleen: Normal in size without focal abnormality. Adrenals/Urinary Tract: No adrenal nodule bilaterally. Bilateral calcified gallstones measuring up to 11 mm on the right and 17mon the left centered within the renal pelvis. There is a 3 mm calcified gallstone within the proximal left ureter. No hydronephrosis, and no contour-deforming renal mass. No ureterolithiasis or hydroureter. The urinary bladder is unremarkable. Stomach/Bowel: PO contrast reaches the descending colon. Stomach is within normal limits. No evidence of bowel wall thickening or dilatation. Appendix appears normal. Vascular/Lymphatic: No abdominal aorta or iliac aneurysm. Severe atherosclerotic plaque of the aorta and its branches. No abdominal, pelvic, or inguinal lymphadenopathy. Reproductive: Prostate is unremarkable. Other: No intraperitoneal free fluid. No intraperitoneal free gas. No organized fluid collection. Musculoskeletal: Tiny fat containing umbilical hernia with a short segment of small bowel adjacent to the hernia. No suspicious lytic or blastic osseous lesions. No acute displaced fracture. Multilevel severe degenerative changes of the spine. IMPRESSION: 1. Please note limited evaluation on this noncontrast study. 2. Cholelithiasis as well as gallbladder wall thickening. Recommend right upper quadrant ultrasound for a more sensitive evaluation of the gallbladder. 3. Nonobstructive bilateral nephrolithiasis with a 14 mm calculus centered within the  left renal pelvis and the largest stone on the right measuring up to 11 mm. 4. Nonobstructive 3 mm proximal left ureterolithiasis. 5. Tiny fat containing umbilical hernia with no findings of associated bowel obstruction or ischemia. 6. Aortic Atherosclerosis (ICD10-I70.0) including coronary artery calcifications  and mitral annular calcifications. Electronically Signed: By: Iven Finn M.D. On: 04/30/2021 22:23    Assessment & Plan:    A total time of 42 minutes was spent preparing to see the patient, reviewing tests, x-rays, operative reports and outside records his recent admission.  Also, obtaining history and performing comprehensive physical exam.  Additionally, counseling the patient regarding the the relapse of his cellulitis.  Finally, documenting clinical information in the health records, coordination of care, namely obtaining antibiotic Nuzyra from a pharmacy in [place].  Arrangements are made.  He is willing to drive there to pick it up.    Walker Kehr, MD

## 2021-05-07 NOTE — Assessment & Plan Note (Addendum)
Recurrent issue after the first episode.  I prescribed Doxy to start 1 twice a day while we are waiting on the [facility] prescription approval.  He will stop doxycycline when he obtains [place]. Nuzyra po prescribed at 3 tablets daily for 2 days then 2 tablets daily for a total of 10 days I am concerned that this is a relapse of cellulitis in the patient with a recent cellulitis and sepsis that required inpatient treatment.  Consider ID consult.

## 2021-05-09 NOTE — Assessment & Plan Note (Signed)
Symptoms of angina are controlled with isosorbide.  Continue with isosorbide

## 2021-05-09 NOTE — Assessment & Plan Note (Signed)
Continue with good hydration.  Nuzyra antibiotic should be okay to use with his degree of renal insufficiency.

## 2021-05-10 NOTE — Telephone Encounter (Signed)
Completed PA via cover-my-meds w/ Key: [id number] - PA Case ID: [id number] - Rx #: [id number] N. Waiting on insurance approval status.Marland KitchenJohny Oneal

## 2021-05-10 NOTE — Telephone Encounter (Signed)
   Jake at [facility] called to get an update on the PA for  Omadacycline Tosylate (Yettem) 150 MG TABS  Phone: [phone]

## 2021-05-12 NOTE — Telephone Encounter (Signed)
Rec'd coverage determination med has been approved through 10/30/21. Faxed approval to [facility]...Scott Oneal

## 2021-05-13 ENCOUNTER — Ambulatory Visit (INDEPENDENT_AMBULATORY_CARE_PROVIDER_SITE_OTHER): Payer: PPO | Admitting: Internal Medicine

## 2021-05-13 ENCOUNTER — Ambulatory Visit: Payer: PPO | Admitting: Internal Medicine

## 2021-05-13 ENCOUNTER — Ambulatory Visit (INDEPENDENT_AMBULATORY_CARE_PROVIDER_SITE_OTHER): Payer: PPO

## 2021-05-13 ENCOUNTER — Other Ambulatory Visit: Payer: Self-pay

## 2021-05-13 ENCOUNTER — Encounter: Payer: Self-pay | Admitting: Internal Medicine

## 2021-05-13 DIAGNOSIS — N200 Calculus of kidney: Secondary | ICD-10-CM | POA: Insufficient documentation

## 2021-05-13 DIAGNOSIS — F411 Generalized anxiety disorder: Secondary | ICD-10-CM | POA: Diagnosis not present

## 2021-05-13 DIAGNOSIS — M1712 Unilateral primary osteoarthritis, left knee: Secondary | ICD-10-CM | POA: Diagnosis not present

## 2021-05-13 DIAGNOSIS — N202 Calculus of kidney with calculus of ureter: Secondary | ICD-10-CM | POA: Diagnosis not present

## 2021-05-13 DIAGNOSIS — N201 Calculus of ureter: Secondary | ICD-10-CM | POA: Diagnosis not present

## 2021-05-13 DIAGNOSIS — R21 Rash and other nonspecific skin eruption: Secondary | ICD-10-CM | POA: Diagnosis not present

## 2021-05-13 DIAGNOSIS — L03119 Cellulitis of unspecified part of limb: Secondary | ICD-10-CM

## 2021-05-13 DIAGNOSIS — L03116 Cellulitis of left lower limb: Secondary | ICD-10-CM

## 2021-05-13 MED ORDER — DOXYCYCLINE HYCLATE 100 MG PO TABS: 100.0000 mg | ORAL_TABLET | Freq: Two times a day (BID) | ORAL | 0 refills | Status: DC

## 2021-05-13 NOTE — Assessment & Plan Note (Signed)
Recurrent Urology appt today

## 2021-05-13 NOTE — Assessment & Plan Note (Addendum)
Elesa Hacker was $1200 His portion)-  he did not get it He is better. Will extend Dox to another 10 days X ray tibia ID consult

## 2021-05-13 NOTE — Assessment & Plan Note (Signed)
Klonopin prn  Potential benefits of a long term benzodiazepines  use as well as potential risks  and complications were explained to the patient and were aknowledged.

## 2021-05-13 NOTE — Progress Notes (Signed)
Subjective:  Patient ID: Scott Oneal, male    DOB: 08-Mar-1941  Age: 80 y.o. MRN: [id number]  CC: Follow-up (1 WEEK F/U- Pt states he never started on the [place] med was too expensive)   HPI Scott Oneal presents for LLE celulitis Elesa Hacker was $1200 He is better. Will finish Doxy   Outpatient Medications Prior to Visit  Medication Sig Dispense Refill   Ascorbic Acid (VITAMIN C PO) Take 1 tablet by mouth every other day.     aspirin 81 MG tablet Take 81 mg by mouth every evening.      B Complex Vitamins (B COMPLEX PO) Take by mouth. Take 1 tablet three times a day     calcitRIOL (ROCALTROL) 0.25 MCG capsule TAKE 1 CAPSULE BY MOUTH EVERY DAY 90 capsule 1   carvedilol (COREG) 25 MG tablet TAKE 1 TABLET BY MOUTH TWICE A DAY WITH MEALS 180 tablet 3   cetirizine (ZYRTEC) 10 MG tablet Take 10 mg by mouth daily as needed for allergies.     Cholecalciferol 2000 UNITS TABS Take 2,000 Units by mouth daily.      clonazePAM (KLONOPIN) 0.25 MG disintegrating tablet TAKE 1 TABLET (0.25 MG TOTAL) BY MOUTH 2 (TWO) TIMES DAILY AS NEEDED (ANXIETY). 60 tablet 3   diclofenac sodium (VOLTAREN) 1 % GEL APPLY 4 G TOPICALLY 4 TIMES DAILY. 200 g 5   doxycycline (VIBRA-TABS) 100 MG tablet Take 1 tablet (100 mg total) by mouth 2 (two) times daily. 20 tablet 0   hydrALAZINE (APRESOLINE) 10 MG tablet Take 1-2 tablet 3 times a day as needed if blood pressure is >170 60 tablet 3   isosorbide mononitrate (IMDUR) 30 MG 24 hr tablet TAKE 1 TABLET BY MOUTH EVERY DAY 90 tablet 2   levothyroxine (SYNTHROID) 175 MCG tablet Take 1 tablet (175 mcg total) by mouth daily. 90 tablet 3   lipase/protease/amylase (CREON) 36000 UNITS CPEP capsule Take 2 capsules (72,000 Units total) by mouth 3 (three) times daily with meals. May also take 1 capsule (36,000 Units total) as needed (with snacks). 240 capsule 11   Multiple Vitamin (MULTIVITAMIN) capsule Take 1 capsule by mouth daily.     Multiple Vitamins-Minerals (PRESERVISION AREDS  2) CAPS Take 1 capsule by mouth in the morning and at bedtime.     nitroGLYCERIN (NITROSTAT) 0.4 MG SL tablet Place 0.4 mg under the tongue every 5 (five) minutes as needed for chest pain.     olmesartan (BENICAR) 5 MG tablet TAKE 1 TABLET BY MOUTH EVERY DAY 90 tablet 3   pantoprazole (PROTONIX) 40 MG tablet Take 1 tablet (40 mg total) by mouth daily. 30 tablet 11   Propylene Glycol (SYSTANE BALANCE) 0.6 % SOLN Place 1 drop into both eyes daily as needed (Dry eye). systane balance     saccharomyces boulardii (FLORASTOR) 250 MG capsule Take 1 capsule (250 mg total) by mouth 2 (two) times daily. 60 capsule 1   simvastatin (ZOCOR) 10 MG tablet TAKE 1 TABLET BY MOUTH EVERYDAY AT BEDTIME 90 tablet 3   SYRINGE-NEEDLE, DISP, 3 ML (BD ECLIPSE SYRINGE) 23G X 1" 3 ML MISC As directed IM 50 each 2   tamsulosin (FLOMAX) 0.4 MG CAPS capsule Take 0.4 mg by mouth.     testosterone cypionate (DEPOTESTOSTERONE CYPIONATE) 200 MG/ML injection INJECT 2ML INTO MUSCLE EVERY 2 WEEKS 12 mL 1   TURMERIC PO Take 1 capsule by mouth 3 (three) times daily.     UNABLE TO FIND Med Name: [facility]  take 2 tablets by mouth daily     Omadacycline Tosylate (NUZYRA) 150 MG TABS Take 450 mg on day 1 and 2. Take 300 mg daily for days 3-10 (Patient not taking: Reported on 05/13/2021) 20 tablet 0   No facility-administered medications prior to visit.    ROS: Review of Systems  Constitutional:  Negative for appetite change, fatigue, fever and unexpected weight change.  HENT:  Negative for congestion, nosebleeds, sneezing, sore throat and trouble swallowing.   Eyes:  Negative for itching and visual disturbance.  Respiratory:  Negative for cough.   Cardiovascular:  Positive for leg swelling. Negative for chest pain and palpitations.  Gastrointestinal:  Positive for diarrhea. Negative for abdominal distention, blood in stool and nausea.  Genitourinary:  Negative for frequency and hematuria.  Musculoskeletal:  Positive for arthralgias.  Negative for back pain, gait problem, joint swelling and neck pain.  Skin:  Positive for color change and rash.  Neurological:  Negative for dizziness, tremors, speech difficulty and weakness.  Psychiatric/Behavioral:  Negative for agitation, dysphoric mood and sleep disturbance. The patient is not nervous/anxious.    Objective:  BP 118/72 (BP Location: Left Arm)   Pulse 65   Temp 98.3 F (36.8 C) (Oral)   Ht 6' (1.829 m)   Wt 185 lb 3.2 oz (84 kg)   SpO2 96%   BMI 25.12 kg/m   BP Readings from Last 3 Encounters:  05/13/21 118/72  05/07/21 (!) 142/86  04/20/21 120/64    Wt Readings from Last 3 Encounters:  05/13/21 185 lb 3.2 oz (84 kg)  05/07/21 185 lb (83.9 kg)  04/20/21 185 lb (83.9 kg)    Physical Exam Constitutional:      General: He is not in acute distress.    Appearance: He is well-developed.     Comments: NAD  Eyes:     Conjunctiva/sclera: Conjunctivae normal.     Pupils: Pupils are equal, round, and reactive to light.  Neck:     Thyroid: No thyromegaly.     Vascular: No JVD.  Cardiovascular:     Rate and Rhythm: Normal rate and regular rhythm.     Heart sounds: Normal heart sounds. No murmur heard.   No friction rub. No gallop.  Pulmonary:     Effort: Pulmonary effort is normal. No respiratory distress.     Breath sounds: Normal breath sounds. No wheezing or rales.  Chest:     Chest wall: No tenderness.  Abdominal:     General: Bowel sounds are normal. There is no distension.     Palpations: Abdomen is soft. There is no mass.     Tenderness: There is no abdominal tenderness. There is no guarding or rebound.  Musculoskeletal:        General: Tenderness present. Normal range of motion.     Cervical back: Normal range of motion.  Lymphadenopathy:     Cervical: No cervical adenopathy.  Skin:    General: Skin is warm and dry.     Findings: Erythema and rash present.  Neurological:     Mental Status: He is alert and oriented to person, place, and time.      Cranial Nerves: No cranial nerve deficit.     Motor: No abnormal muscle tone.     Coordination: Coordination normal.     Gait: Gait normal.     Deep Tendon Reflexes: Reflexes are normal and symmetric.  Psychiatric:        Behavior: Behavior normal.  Thought Content: Thought content normal.        Judgment: Judgment normal.   Trace ankle edema, hyperpigmentation B shins L dist shin erythema - NT R forearm 12x9 cm erythema patch Limping - L  Lab Results  Component Value Date   WBC 6.6 04/13/2021   HGB 15.2 04/13/2021   HCT 45.3 04/13/2021   PLT 162.0 04/13/2021   GLUCOSE 81 04/15/2021   CHOL 115 02/18/2020   TRIG 115 02/18/2020   HDL 34 (L) 02/18/2020   LDLCALC 60 02/18/2020   ALT 23 04/15/2021   AST 25 04/15/2021   NA 139 04/15/2021   K 4.8 04/15/2021   CL 103 04/15/2021   CREATININE 2.25 (H) 04/15/2021   BUN 18 04/15/2021   CO2 29 04/15/2021   TSH 11.04 (H) 04/15/2021   PSA 2.08 08/24/2020   INR 1.20 11/26/2014   HGBA1C 5.9 03/15/2016   MICROALBUR 1.2 05/18/2017    CT ABDOMEN PELVIS WO CONTRAST  Addendum Date: 04/30/2021   ADDENDUM REPORT: 04/30/2021 22:41 ADDENDUM: PLEASE DISREGARD ADRENALS/URINARY TRACT IN FINDINGS ABOVE. THIS SHOULD READ AS FOLLOWS: Adrenals/Urinary Tract: No adrenal nodule bilaterally. Bilateral calcified RENAL STONES measuring up to 11 mm on the right and 31m on the left centered within the renal pelvis. There is a 3 mm calcified RENAL STONES within the proximal left ureter. No hydronephrosis BILATERALLY. No contour-deforming renal mass. No RIGHT ureterolithiasis.  NO hydroureter BILATERALLY. The urinary bladder is unremarkable. Electronically Signed   By: MIven FinnM.D.   On: 04/30/2021 22:41   Result Date: 04/30/2021 CLINICAL DATA:  Diarrhea, weight loss EXAM: CT ABDOMEN AND PELVIS WITHOUT CONTRAST TECHNIQUE: Multidetector CT imaging of the abdomen and pelvis was performed following the standard protocol without IV contrast.  COMPARISON:  None. FINDINGS: Lower chest: Linear atelectasis versus scarring of left lower lobe. Coronary artery calcifications. Severe mitral annular calcifications. Hepatobiliary: Punctate calcifications of the hepatic parenchyma suggestive of sequelae of prior granulomatous disease. No focal liver abnormality. Gallstones are noted within the gallbladder lumen. Circumferential gallbladder wall thickening. No definite pericholecystic fluid. No biliary dilatation. Pancreas: No focal lesion. Normal pancreatic contour. No surrounding inflammatory changes. No main pancreatic ductal dilatation. Spleen: Normal in size without focal abnormality. Adrenals/Urinary Tract: No adrenal nodule bilaterally. Bilateral calcified gallstones measuring up to 11 mm on the right and 178mon the left centered within the renal pelvis. There is a 3 mm calcified gallstone within the proximal left ureter. No hydronephrosis, and no contour-deforming renal mass. No ureterolithiasis or hydroureter. The urinary bladder is unremarkable. Stomach/Bowel: PO contrast reaches the descending colon. Stomach is within normal limits. No evidence of bowel wall thickening or dilatation. Appendix appears normal. Vascular/Lymphatic: No abdominal aorta or iliac aneurysm. Severe atherosclerotic plaque of the aorta and its branches. No abdominal, pelvic, or inguinal lymphadenopathy. Reproductive: Prostate is unremarkable. Other: No intraperitoneal free fluid. No intraperitoneal free gas. No organized fluid collection. Musculoskeletal: Tiny fat containing umbilical hernia with a short segment of small bowel adjacent to the hernia. No suspicious lytic or blastic osseous lesions. No acute displaced fracture. Multilevel severe degenerative changes of the spine. IMPRESSION: 1. Please note limited evaluation on this noncontrast study. 2. Cholelithiasis as well as gallbladder wall thickening. Recommend right upper quadrant ultrasound for a more sensitive evaluation of  the gallbladder. 3. Nonobstructive bilateral nephrolithiasis with a 14 mm calculus centered within the left renal pelvis and the largest stone on the right measuring up to 11 mm. 4. Nonobstructive 3 mm proximal left ureterolithiasis. 5. Tiny  fat containing umbilical hernia with no findings of associated bowel obstruction or ischemia. 6. Aortic Atherosclerosis (ICD10-I70.0) including coronary artery calcifications and mitral annular calcifications. Electronically Signed: By: Iven Finn M.D. On: 04/30/2021 22:23    Assessment & Plan:     Walker Kehr, MD

## 2021-05-13 NOTE — Assessment & Plan Note (Signed)
R forearm 12x9 cm erythema patch - ?appears w/leg cellulitis

## 2021-05-18 ENCOUNTER — Other Ambulatory Visit: Payer: Self-pay | Admitting: Urology

## 2021-05-18 ENCOUNTER — Other Ambulatory Visit: Payer: Self-pay | Admitting: Internal Medicine

## 2021-05-19 ENCOUNTER — Ambulatory Visit (HOSPITAL_COMMUNITY)
Admission: RE | Admit: 2021-05-19 | Discharge: 2021-05-19 | Disposition: A | Discharge status: Home / Self Care | Payer: PPO | Source: Ambulatory Visit | Attending: Internal Medicine | Admitting: Internal Medicine

## 2021-05-19 ENCOUNTER — Other Ambulatory Visit (HOSPITAL_COMMUNITY): Payer: Self-pay | Admitting: Urology

## 2021-05-19 ENCOUNTER — Other Ambulatory Visit: Payer: Self-pay

## 2021-05-19 DIAGNOSIS — K828 Other specified diseases of gallbladder: Secondary | ICD-10-CM | POA: Insufficient documentation

## 2021-05-19 DIAGNOSIS — N201 Calculus of ureter: Secondary | ICD-10-CM

## 2021-05-19 DIAGNOSIS — K8020 Calculus of gallbladder without cholecystitis without obstruction: Secondary | ICD-10-CM | POA: Diagnosis not present

## 2021-05-19 DIAGNOSIS — N200 Calculus of kidney: Secondary | ICD-10-CM

## 2021-05-31 ENCOUNTER — Other Ambulatory Visit: Payer: Self-pay

## 2021-05-31 ENCOUNTER — Ambulatory Visit: Payer: PPO | Admitting: Infectious Disease

## 2021-05-31 ENCOUNTER — Ambulatory Visit
Admission: RE | Admit: 2021-05-31 | Discharge: 2021-05-31 | Disposition: A | Discharge status: Home / Self Care | Payer: PPO | Source: Ambulatory Visit | Attending: Infectious Disease | Admitting: Infectious Disease

## 2021-05-31 ENCOUNTER — Other Ambulatory Visit: Payer: Self-pay | Admitting: Infectious Disease

## 2021-05-31 ENCOUNTER — Encounter: Payer: Self-pay | Admitting: Infectious Disease

## 2021-05-31 VITALS — BP 139/82 | HR 63 | Temp 97.5°F | Wt 212.0 lb

## 2021-05-31 DIAGNOSIS — L03119 Cellulitis of unspecified part of limb: Secondary | ICD-10-CM

## 2021-05-31 DIAGNOSIS — R634 Abnormal weight loss: Secondary | ICD-10-CM | POA: Diagnosis not present

## 2021-05-31 DIAGNOSIS — N1832 Chronic kidney disease, stage 3b: Secondary | ICD-10-CM | POA: Diagnosis not present

## 2021-05-31 DIAGNOSIS — R197 Diarrhea, unspecified: Secondary | ICD-10-CM | POA: Diagnosis not present

## 2021-05-31 DIAGNOSIS — M1712 Unilateral primary osteoarthritis, left knee: Secondary | ICD-10-CM | POA: Diagnosis not present

## 2021-05-31 DIAGNOSIS — L270 Generalized skin eruption due to drugs and medicaments taken internally: Secondary | ICD-10-CM | POA: Diagnosis not present

## 2021-05-31 DIAGNOSIS — R600 Localized edema: Secondary | ICD-10-CM | POA: Diagnosis not present

## 2021-05-31 DIAGNOSIS — R29898 Other symptoms and signs involving the musculoskeletal system: Secondary | ICD-10-CM | POA: Diagnosis not present

## 2021-05-31 DIAGNOSIS — D696 Thrombocytopenia, unspecified: Secondary | ICD-10-CM

## 2021-05-31 DIAGNOSIS — K8689 Other specified diseases of pancreas: Secondary | ICD-10-CM | POA: Diagnosis not present

## 2021-05-31 DIAGNOSIS — M545 Low back pain, unspecified: Secondary | ICD-10-CM | POA: Diagnosis not present

## 2021-05-31 DIAGNOSIS — L03116 Cellulitis of left lower limb: Secondary | ICD-10-CM | POA: Diagnosis not present

## 2021-05-31 DIAGNOSIS — B353 Tinea pedis: Secondary | ICD-10-CM | POA: Diagnosis not present

## 2021-05-31 HISTORY — DX: Tinea pedis: B35.3

## 2021-05-31 HISTORY — DX: Other specified diseases of pancreas: K86.89

## 2021-05-31 HISTORY — DX: Other symptoms and signs involving the musculoskeletal system: R29.898

## 2021-05-31 NOTE — Progress Notes (Addendum)
Subjective:   Reason for Infectious Disease Consultation: Problems with cellulitis of the left lower extremity  Requesting Physician: Dr. Alain Marion    Patient ID: Scott Oneal, male    DOB: January 10, 1941, 80 y.o.   MRN: [id number]  Ford Cliff is a 80 year old Caucasian man with multiple medical problems including chronic diarrhea that sounds as if it is due to pancreatic insufficiency as he says it is improved now that he began pancreatic enzyme replacement.  He also has coronary artery disease and a history of cholelithiasis and also a lifelong history of kidney stones.  [name] tells me that he first developed problems with his left lower extremity this June.  He had been working outside extensively in the yard and came back in and noticed erythema and swelling of his leg.  He lay back in recliner and fell asleep that evening.  The next day when he attempted to get out of bed he was too weak to get out of the recliner.  He was ultimately hospitalized at [facility] and stayed there for 4 days.  I do not have access to his medical records but he shows me a photograph of one of the antibiotics he was given as an inpatient which was in fact vancomycin.  He was ultimately discharged on doxycycline and Augmentin and saw his primary care physician Dr. Alain Marion on April 05, 2021.  According to Dr. Alain Marion the patient had relapse of his left lower extremity redness with pain and swelling.  This apparently [place]  [name] tried to acquire Elesa Hacker for the patient but it was cost-prohibitive.   Doxycycline was given in the interim.  He was referred to [place] in infectious disease.  Tells me that he did not have much in the improvement in the way of his erythema while on the doxycycline.  He has now developed a rash all over his forearms and his face that is highly consistent with a sun exposure rash on doxycycline at his visit is quite painful and quite erythematous in  the sun exposed regions.  His diarrhea has been improving while he has been on his pancreatic replacement enzymes.  In questioning him he also mentions history of his left leg giving out from underneath him at times.  He has had an MRI of the lumbar spine this spring but not since he began having the symptoms of his leg giving out from underneath him as he describes them to me today.  His left lower extremity seems to have some erythema and is quite tender in some of the areas around this.  He does not show evidence of systemic infection with fevers chills nausea vomiting or anorexia.      Past Medical History:  Diagnosis Date   Allergy    Anxiety    Arthritis    Asthma    Benign neoplasm of colon    Calculus of gallbladder without mention of cholecystitis    Cataract    bilateral cateracts removed   Celiac disease    Cholelithiasis    Coronary atherosclerosis of unspecified type of vessel, native or graft    Diarrhea    Esophageal reflux    Glaucoma    Gout, unspecified    Hyperparathyroidism    Long term (current) use of anticoagulants    Loss of weight    Lower extremity weakness 05/31/2021   Old myocardial infarction    Osteoporosis, unspecified    Other malaise  and fatigue    Other specified cardiac dysrhythmias(427.89)    Other testicular hypofunction    Pancreatic insufficiency 05/31/2021   Personal history of colonic polyps    Pure hypercholesterolemia    Renal insufficiency    chronic   Unspecified asthma(493.90)    Unspecified essential hypertension    Unspecified hypothyroidism     Past Surgical History:  Procedure Laterality Date   cataract surgery Bilateral    COLONOSCOPY  06/26/2017   [facility]   COLONOSCOPY WITH PROPOFOL N/A 11/27/2014   Procedure: COLONOSCOPY WITH PROPOFOL;  Surgeon: Milus Banister, MD;  Location: [facility];  Service: Endoscopy;  Laterality: N/A;   CORONARY ANGIOPLASTY     CORONARY STENT PLACEMENT  2011   Cypher; in distal  circumflex artery   CYSTOSCOPY  1998   EYE SURGERY     KNEE SURGERY     right   LEFT HEART CATH AND CORONARY ANGIOGRAPHY N/A 01/31/2020   Procedure: LEFT HEART CATH AND CORONARY ANGIOGRAPHY;  Surgeon: Belva Crome, MD;  Location: [facility];  Service: Cardiovascular;  Laterality: N/A;   SHOULDER SURGERY Left 9/14   Dr Shara Blazing   UPPER GASTROINTESTINAL ENDOSCOPY     VASCULAR SURGERY      Family History  Problem Relation Age of Onset   Lymphoma Father    Hypertension Other    Vision loss Maternal Grandmother    Colon cancer Son 60   Asthma Neg Hx    Esophageal cancer Neg Hx    Rectal cancer Neg Hx    Stomach cancer Neg Hx    Colon polyps Neg Hx       Social History   Socioeconomic History   Marital status: Married    Spouse name: Dredyn Gubbels   Number of children: 3   Years of education: Not on file   Highest education level: Not on file  Occupational History   Occupation: retired    Fish farm manager: RETIRED    Comment: Engineering  Tobacco Use   Smoking status: Never   Smokeless tobacco: Never  Vaping Use   Vaping Use: Never used  Substance and Sexual Activity   Alcohol use: No   Drug use: No   Sexual activity: Yes  Other Topics Concern   Not on file  Social History Narrative   Retired.   Regular Exercise-Yes; yoga & silver sneakers   Daily Caffeine Use.            Social Determinants of Health   Financial Resource Strain: Low Risk    Difficulty of Paying Living Expenses: Not hard at all  Food Insecurity: No Food Insecurity   Worried About Charity fundraiser in the Last Year: Never true   [place] in the Last Year: Never true  Transportation Needs: No Transportation Needs   Lack of Transportation (Medical): No   Lack of Transportation (Non-Medical): No  Physical Activity: Sufficiently Active   Days of Exercise per Week: 5 days   Minutes of Exercise per Session: 30 min  Stress: No Stress Concern Present   Feeling of Stress : Not at all   Social Connections: Socially Integrated   Frequency of Communication with Friends and Family: More than three times a week   Frequency of Social Gatherings with Friends and Family: More than three times a week   Attends Religious Services: More than 4 times per year   Active Member of [employer] or Organizations: Yes   Attends Archivist  Meetings: More than 4 times per year   Marital Status: Married    Allergies  Allergen Reactions   Biaxin [Clarithromycin]     Dizziness   Allantoin    Metoprolol    Tizanidine     Felt funny   Allantoin-Pramoxine Other (See Comments)    Pt does't remember reaction   Amlodipine     Edema    Benzalkonium Chloride     Other reaction(s): mild rash/itching   Metoprolol Tartrate Other (See Comments)    REACTION: fatigue     Current Outpatient Medications:    Ascorbic Acid (VITAMIN C PO), Take 1 tablet by mouth every other day., Disp: , Rfl:    aspirin 81 MG tablet, Take 81 mg by mouth every evening. , Disp: , Rfl:    B Complex Vitamins (B COMPLEX PO), Take by mouth. Take 1 tablet three times a day, Disp: , Rfl:    calcitRIOL (ROCALTROL) 0.25 MCG capsule, TAKE 1 CAPSULE BY MOUTH EVERY DAY, Disp: 90 capsule, Rfl: 1   carvedilol (COREG) 25 MG tablet, TAKE 1 TABLET BY MOUTH TWICE A DAY WITH MEALS, Disp: 180 tablet, Rfl: 3   cetirizine (ZYRTEC) 10 MG tablet, Take 10 mg by mouth daily as needed for allergies., Disp: , Rfl:    Cholecalciferol 2000 UNITS TABS, Take 2,000 Units by mouth daily. , Disp: , Rfl:    clonazePAM (KLONOPIN) 0.25 MG disintegrating tablet, TAKE 1 TABLET (0.25 MG TOTAL) BY MOUTH 2 (TWO) TIMES DAILY AS NEEDED (ANXIETY)., Disp: 60 tablet, Rfl: 3   diclofenac sodium (VOLTAREN) 1 % GEL, APPLY 4 G TOPICALLY 4 TIMES DAILY., Disp: 200 g, Rfl: 5   hydrALAZINE (APRESOLINE) 10 MG tablet, Take 1-2 tablet 3 times a day as needed if blood pressure is >170, Disp: 60 tablet, Rfl: 3   isosorbide mononitrate (IMDUR) 30 MG 24 hr tablet, TAKE 1  TABLET BY MOUTH EVERY DAY, Disp: 90 tablet, Rfl: 2   levothyroxine (SYNTHROID) 175 MCG tablet, Take 1 tablet (175 mcg total) by mouth daily., Disp: 90 tablet, Rfl: 3   Multiple Vitamin (MULTIVITAMIN) capsule, Take 1 capsule by mouth daily., Disp: , Rfl:    Multiple Vitamins-Minerals (PRESERVISION AREDS 2) CAPS, Take 1 capsule by mouth in the morning and at bedtime., Disp: , Rfl:    nitroGLYCERIN (NITROSTAT) 0.4 MG SL tablet, Place 0.4 mg under the tongue every 5 (five) minutes as needed for chest pain., Disp: , Rfl:    olmesartan (BENICAR) 5 MG tablet, TAKE 1 TABLET BY MOUTH EVERY DAY, Disp: 90 tablet, Rfl: 3   pantoprazole (PROTONIX) 40 MG tablet, Take 1 tablet (40 mg total) by mouth daily., Disp: 30 tablet, Rfl: 11   Propylene Glycol (SYSTANE BALANCE) 0.6 % SOLN, Place 1 drop into both eyes daily as needed (Dry eye). systane balance, Disp: , Rfl:    saccharomyces boulardii (FLORASTOR) 250 MG capsule, Take 1 capsule (250 mg total) by mouth 2 (two) times daily., Disp: 60 capsule, Rfl: 1   simvastatin (ZOCOR) 10 MG tablet, TAKE 1 TABLET BY MOUTH EVERYDAY AT BEDTIME, Disp: 90 tablet, Rfl: 3   SYRINGE-NEEDLE, DISP, 3 ML (BD ECLIPSE SYRINGE) 23G X 1" 3 ML MISC, As directed IM, Disp: 50 each, Rfl: 2   tamsulosin (FLOMAX) 0.4 MG CAPS capsule, Take 0.4 mg by mouth., Disp: , Rfl:    testosterone cypionate (DEPOTESTOSTERONE CYPIONATE) 200 MG/ML injection, INJECT 2ML INTO MUSCLE EVERY 2 WEEKS, Disp: 12 mL, Rfl: 1   doxycycline (VIBRA-TABS) 100 MG tablet,  Take 1 tablet (100 mg total) by mouth 2 (two) times daily. (Patient not taking: Reported on 05/31/2021), Disp: 20 tablet, Rfl: 0   lipase/protease/amylase (CREON) 36000 UNITS CPEP capsule, Take 2 capsules (72,000 Units total) by mouth 3 (three) times daily with meals. May also take 1 capsule (36,000 Units total) as needed (with snacks)., Disp: 240 capsule, Rfl: 11   TURMERIC PO, Take 1 capsule by mouth 3 (three) times daily. (Patient not taking: Reported on  05/31/2021), Disp: , Rfl:    UNABLE TO FIND, Med Name: [facility] take 2 tablets by mouth daily (Patient not taking: Reported on 05/31/2021), Disp: , Rfl:    Review of Systems  Constitutional:  Negative for chills, diaphoresis and fever.  HENT:  Negative for congestion, hearing loss, sore throat and tinnitus.   Eyes:  Negative for photophobia.  Respiratory:  Negative for cough, shortness of breath and wheezing.   Cardiovascular:  Negative for chest pain, palpitations and leg swelling.  Gastrointestinal:  Negative for abdominal pain, blood in stool, constipation, diarrhea, nausea and vomiting.  Genitourinary:  Negative for dysuria, flank pain, hematuria, penile pain and testicular pain.  Musculoskeletal:  Positive for back pain. Negative for myalgias.  Skin:  Positive for color change and rash.  Neurological:  Positive for weakness. Negative for dizziness and headaches.  Hematological:  Does not bruise/bleed easily.  Psychiatric/Behavioral:  Negative for agitation, behavioral problems, confusion, decreased concentration, dysphoric mood, hallucinations and suicidal ideas. The patient is not nervous/anxious.       Objective:   Physical Exam Constitutional:      General: He is not in acute distress.    Appearance: Normal appearance. He is well-developed. He is not ill-appearing or diaphoretic.  HENT:     Head: Normocephalic and atraumatic.     Right Ear: Hearing and external ear normal.     Left Ear: Hearing and external ear normal.     Nose: No nasal deformity or rhinorrhea.  Eyes:     General: No scleral icterus.    Conjunctiva/sclera: Conjunctivae normal.     Right eye: Right conjunctiva is not injected.     Left eye: Left conjunctiva is not injected.     Pupils: Pupils are equal, round, and reactive to light.  Neck:     Vascular: No JVD.  Cardiovascular:     Rate and Rhythm: Normal rate and regular rhythm.     Heart sounds: Normal heart sounds, S1 normal and S2 normal. No murmur  heard.   No friction rub.  Pulmonary:     Effort: Pulmonary effort is normal. No respiratory distress.     Breath sounds: Normal breath sounds. No stridor. No wheezing, rhonchi or rales.  Abdominal:     General: Bowel sounds are normal. There is no distension.     Palpations: Abdomen is soft.     Tenderness: There is no abdominal tenderness.  Musculoskeletal:        General: Normal range of motion.     Right shoulder: Normal.     Left shoulder: Normal.     Cervical back: Normal range of motion and neck supple.     Right hip: Normal.     Left hip: Normal.     Right knee: Normal.     Left knee: Normal.  Lymphadenopathy:     Head:     Right side of head: No submandibular, preauricular or posterior auricular adenopathy.     Left side of head: No submandibular, preauricular or posterior  auricular adenopathy.     Cervical: No cervical adenopathy.     Right cervical: No superficial or deep cervical adenopathy.    Left cervical: No superficial or deep cervical adenopathy.  Skin:    General: Skin is warm and dry.     Coloration: Skin is not pale.     Findings: Rash present. No abrasion, bruising, ecchymosis, erythema or lesion.     Nails: There is no clubbing.  Neurological:     Mental Status: He is alert and oriented to person, place, and time.     Sensory: No sensory deficit.     Motor: Motor function is intact.     Coordination: Coordination is intact.  Psychiatric:        Attention and Perception: He is attentive.        Mood and Affect: Mood normal.        Speech: Speech normal.        Behavior: Behavior normal. Behavior is cooperative.        Thought Content: Thought content normal.        Judgment: Judgment normal.    Left leg today 05/31/2021: he is tender near the area where there was bleeding     Possible tinea on right foot     Rash from doxy in sun exposed areas         Assessment & Plan:   Hx of cellulitis in right leg:  I do not have access to his  records from his hospitalization.  I am skeptical that he has active cellulitis at present.  Will obtain plain films of his leg to make sure there is not sustained a fracture here.  We could consider an MRI of the leg as well although would prefer to observe him for now.  Told him if he has further recurrences of cellulitis suggesting serious infection I would like him to then rather be treated with a drug that has better streptococcal activity such as Keflex or Augmentin.  This would also cover MSSA but not MRSA the story however is not very good for MRSA given it was a nonpurulent cellulitis.  I think if he does have further episodes it will be most beneficial for him to have antibiotics on hand at home that he can start at the first sign of infection.  I agree with continuing with elevation of the legs and compression stockings.  Venous stasis changes appears to be developing this in the left lower extremity in particular.  Left lower extremity weakness: This along with his low back pain and his history of being hospitalized with what sounded like septic physiology make me concerned about whether or not his cellulitis was severe enough that he might of had for example a streptococcal or staphylococcal bacteremia that could have seeded his lumbosacral area.  I am already inflammatory markers today and have also ordered plain films of the back.  If the plain films are normal I would like to pursue another MRI of the lumbar spine.  This will need to be without gadolinium I suspect  Tinea pedis: He seems to have tinea on the right lower extremity and I have asked him to try topical clotrimazole for this.  Doxycycline induced rash that being in the sun he is stop doxycycline update this to the medicines that he has problems tolerating.  I spent 82 minutes with the patient including >50% of the time in  face to face counseling of the patient during the nature  of cellulitis and deep sets  tissue infections sepsis, the organisms involved in these types of infections, personally reviewing his MRI of his lumbar spine from March 2022 showing moderate to severe spinal canal and bilateral for mental narrowing at L3-4 and L4-5.  Along with his most recent CBC with differential from April 13, 2021 and CMP from the same date which shows chronic kidney disease with a creatinine of 2.25 with medical records including from Dr. Alain Marion before and during the visit and in coordination of his care.

## 2021-06-01 LAB — CBC WITH DIFFERENTIAL/PLATELET
Absolute Monocytes: 502 cells/uL (ref 200–950)
Basophils Absolute: 71 cells/uL (ref 0–200)
Basophils Relative: 1.2 %
Eosinophils Absolute: 148 cells/uL (ref 15–500)
Eosinophils Relative: 2.5 %
HCT: 39.1 % (ref 38.5–50.0)
Hemoglobin: 12.6 g/dL — ABNORMAL LOW (ref 13.2–17.1)
Lymphs Abs: 1080 cells/uL (ref 850–3900)
MCH: 29.9 pg (ref 27.0–33.0)
MCHC: 32.2 g/dL (ref 32.0–36.0)
MCV: 92.9 fL (ref 80.0–100.0)
MPV: 11.6 fL (ref 7.5–12.5)
Monocytes Relative: 8.5 %
Neutro Abs: 4101 cells/uL (ref 1500–7800)
Neutrophils Relative %: 69.5 %
Platelets: 92 10*3/uL — ABNORMAL LOW (ref 140–400)
RBC: 4.21 10*6/uL (ref 4.20–5.80)
RDW: 13.8 % (ref 11.0–15.0)
Total Lymphocyte: 18.3 %
WBC: 5.9 10*3/uL (ref 3.8–10.8)

## 2021-06-01 LAB — SEDIMENTATION RATE: Sed Rate: 2 mm/h (ref 0–20)

## 2021-06-01 LAB — COMPLETE METABOLIC PANEL WITH GFR
AG Ratio: 1.7 (calc) (ref 1.0–2.5)
ALT: 25 U/L (ref 9–46)
AST: 31 U/L (ref 10–35)
Albumin: 3.4 g/dL — ABNORMAL LOW (ref 3.6–5.1)
Alkaline phosphatase (APISO): 69 U/L (ref 35–144)
BUN/Creatinine Ratio: 10 (calc) (ref 6–22)
BUN: 24 mg/dL (ref 7–25)
CO2: 29 mmol/L (ref 20–32)
Calcium: 9.1 mg/dL (ref 8.6–10.3)
Chloride: 109 mmol/L (ref 98–110)
Creat: 2.42 mg/dL — ABNORMAL HIGH (ref 0.70–1.28)
Globulin: 2 g/dL (calc) (ref 1.9–3.7)
Glucose, Bld: 102 mg/dL — ABNORMAL HIGH (ref 65–99)
Potassium: 4.7 mmol/L (ref 3.5–5.3)
Sodium: 143 mmol/L (ref 135–146)
Total Bilirubin: 0.6 mg/dL (ref 0.2–1.2)
Total Protein: 5.4 g/dL — ABNORMAL LOW (ref 6.1–8.1)
eGFR: 27 mL/min/{1.73_m2} — ABNORMAL LOW (ref >=60)

## 2021-06-01 LAB — C-REACTIVE PROTEIN: CRP: 5.2 mg/L (ref <8.0)

## 2021-06-04 ENCOUNTER — Telehealth: Payer: Self-pay

## 2021-06-04 NOTE — Telephone Encounter (Signed)
Called to follow up on L leg. Patient reports increased swelling and redness from L ankle to L knee. Increased pain  and warm to the touch. Declines fever and states his balance and gai

## 2021-06-07 ENCOUNTER — Encounter: Payer: Self-pay | Admitting: Cardiovascular Disease

## 2021-06-07 ENCOUNTER — Other Ambulatory Visit: Payer: Self-pay

## 2021-06-07 ENCOUNTER — Ambulatory Visit: Payer: PPO | Admitting: Cardiovascular Disease

## 2021-06-07 VITALS — BP 120/76 | HR 62 | Ht 72.0 in | Wt 183.2 lb

## 2021-06-07 DIAGNOSIS — E782 Mixed hyperlipidemia: Secondary | ICD-10-CM | POA: Diagnosis not present

## 2021-06-07 DIAGNOSIS — N184 Chronic kidney disease, stage 4 (severe): Secondary | ICD-10-CM

## 2021-06-07 DIAGNOSIS — I10 Essential (primary) hypertension: Secondary | ICD-10-CM | POA: Diagnosis not present

## 2021-06-07 DIAGNOSIS — I25118 Atherosclerotic heart disease of native coronary artery with other forms of angina pectoris: Secondary | ICD-10-CM | POA: Diagnosis not present

## 2021-06-07 NOTE — Progress Notes (Signed)
Cardiology Office Note:    Date:  06/07/2021   ID:  Scott Oneal, Scott Oneal Mar 03, 1941, MRN [id number]  PCP:  Cassandria Anger, MD   [facility] Providers Cardiologist:  Sherren Mocha, MD     Referring MD: Cassandria Anger, MD   Chief Complaint  Patient presents with   Coronary Artery Disease   History of Present Illness:    Scott Oneal is a 80 y.o. male with a hx of: Coronary artery disease S/p inferior MI in 2005 >> PCI: overlapping DES to RCA Staged PCI: DES to LCx Cath 4/21: LCx and RCA stents patent w mod ISR, mod mid and dist LAD dz >> med rx Combined Systolic and Diastolic CHF Echocardiogram 12/2019: EF 45-50, Gr 2 DD Chronic kidney disease Hypertension Hyperlipidemia  The patient is here alone today.  He has had several noncardiac issues going on of late.  He is scheduled to have a kidney stone surgery August 22.  He has been treated for diarrhea and weight loss and undergone evaluation by Dr. Fuller Plan this summer.  He has had problems with cellulitis of the left leg and is now followed by Dr. Tommy Medal with infectious disease.  His chronic kidney disease has been followed by Dr. Moshe Cipro and creatinine has trended from 2.08 in June of this year to 2.42 on recent labs drawn May 31, 2021.  Today, he denies symptoms of palpitations, chest pain, shortness of breath, orthopnea, PND, lower extremity edema, dizziness, or syncope.   Past Medical History:  Diagnosis Date   Allergy    Anxiety    Arthritis    Asthma    Benign neoplasm of colon    Calculus of gallbladder without mention of cholecystitis    Cataract    bilateral cateracts removed   Celiac disease    Cholelithiasis    Coronary atherosclerosis of unspecified type of vessel, native or graft    Diarrhea    Esophageal reflux    Glaucoma    Gout, unspecified    Hyperparathyroidism    Long term (current) use of anticoagulants    Loss of weight    Lower extremity weakness 05/31/2021   Old myocardial  infarction    Osteoporosis, unspecified    Other malaise and fatigue    Other specified cardiac dysrhythmias(427.89)    Other testicular hypofunction    Pancreatic insufficiency 05/31/2021   Personal history of colonic polyps    Pure hypercholesterolemia    Renal insufficiency    chronic   Tinea pedis 05/31/2021   Unspecified asthma(493.90)    Unspecified essential hypertension    Unspecified hypothyroidism     Past Surgical History:  Procedure Laterality Date   cataract surgery Bilateral    COLONOSCOPY  06/26/2017   Fuller Plan   COLONOSCOPY WITH PROPOFOL N/A 11/27/2014   Procedure: COLONOSCOPY WITH PROPOFOL;  Surgeon: Milus Banister, MD;  Location: [place];  Service: Endoscopy;  Laterality: N/A;   CORONARY ANGIOPLASTY     CORONARY STENT PLACEMENT  2011   Cypher; in distal circumflex artery   CYSTOSCOPY  1998   EYE SURGERY     KNEE SURGERY     right   LEFT HEART CATH AND CORONARY ANGIOGRAPHY N/A 01/31/2020   Procedure: LEFT HEART CATH AND CORONARY ANGIOGRAPHY;  Surgeon: Belva Crome, MD;  Location: [place] [facility];  Service: Cardiovascular;  Laterality: N/A;   SHOULDER SURGERY Left 9/14   Dr Shara Blazing   UPPER GASTROINTESTINAL ENDOSCOPY  VASCULAR SURGERY      Current Medications: Current Meds  Medication Sig   Ascorbic Acid (VITAMIN C PO) Take 1 tablet by mouth every other day.   aspirin 81 MG tablet Take 81 mg by mouth every evening.    B Complex Vitamins (B COMPLEX PO) Take by mouth. Take 1 tablet three times a day   calcitRIOL (ROCALTROL) 0.25 MCG capsule TAKE 1 CAPSULE BY MOUTH EVERY DAY   carvedilol (COREG) 25 MG tablet TAKE 1 TABLET BY MOUTH TWICE A DAY WITH MEALS   cetirizine (ZYRTEC) 10 MG tablet Take 10 mg by mouth daily as needed for allergies.   Cholecalciferol 2000 UNITS TABS Take 2,000 Units by mouth daily.    clonazePAM (KLONOPIN) 0.25 MG disintegrating tablet TAKE 1 TABLET (0.25 MG TOTAL) BY MOUTH 2 (TWO) TIMES DAILY AS NEEDED (ANXIETY).   diclofenac  sodium (VOLTAREN) 1 % GEL APPLY 4 G TOPICALLY 4 TIMES DAILY.   hydrALAZINE (APRESOLINE) 10 MG tablet Take 1-2 tablet 3 times a day as needed if blood pressure is >170   isosorbide mononitrate (IMDUR) 30 MG 24 hr tablet TAKE 1 TABLET BY MOUTH EVERY DAY   levothyroxine (SYNTHROID) 175 MCG tablet Take 1 tablet (175 mcg total) by mouth daily.   lipase/protease/amylase (CREON) 36000 UNITS CPEP capsule Take 2 capsules (72,000 Units total) by mouth 3 (three) times daily with meals. May also take 1 capsule (36,000 Units total) as needed (with snacks).   Multiple Vitamin (MULTIVITAMIN) capsule Take 1 capsule by mouth daily.   Multiple Vitamins-Minerals (PRESERVISION AREDS 2) CAPS Take 1 capsule by mouth in the morning and at bedtime.   nitroGLYCERIN (NITROSTAT) 0.4 MG SL tablet Place 0.4 mg under the tongue every 5 (five) minutes as needed for chest pain.   olmesartan (BENICAR) 5 MG tablet TAKE 1 TABLET BY MOUTH EVERY DAY   pantoprazole (PROTONIX) 40 MG tablet Take 1 tablet (40 mg total) by mouth daily.   Propylene Glycol (SYSTANE BALANCE) 0.6 % SOLN Place 1 drop into both eyes daily as needed (Dry eye). systane balance   saccharomyces boulardii (FLORASTOR) 250 MG capsule Take 1 capsule (250 mg total) by mouth 2 (two) times daily.   simvastatin (ZOCOR) 10 MG tablet TAKE 1 TABLET BY MOUTH EVERYDAY AT BEDTIME   SYRINGE-NEEDLE, DISP, 3 ML (BD ECLIPSE SYRINGE) 23G X 1" 3 ML MISC As directed IM   tamsulosin (FLOMAX) 0.4 MG CAPS capsule Take 0.4 mg by mouth.   testosterone cypionate (DEPOTESTOSTERONE CYPIONATE) 200 MG/ML injection INJECT 2ML INTO MUSCLE EVERY 2 WEEKS   TURMERIC PO Take 1 capsule by mouth 3 (three) times daily.   UNABLE TO FIND Med Name: [facility] take 2 tablets by mouth daily     Allergies:   Biaxin [clarithromycin], Allantoin, Metoprolol, Tizanidine, Allantoin-pramoxine, Amlodipine, Benzalkonium chloride, Doxycycline, and Metoprolol tartrate   Social History   Socioeconomic History    Marital status: Married    Spouse name: Hays Dunnigan   Number of children: 3   Years of education: Not on file   Highest education level: Not on file  Occupational History   Occupation: retired    Fish farm manager: RETIRED    Comment: Engineering  Tobacco Use   Smoking status: Never   Smokeless tobacco: Never  Vaping Use   Vaping Use: Never used  Substance and Sexual Activity   Alcohol use: No   Drug use: No   Sexual activity: Yes  Other Topics Concern   Not on file  Social History Narrative  Retired.   Regular Exercise-Yes; yoga & silver sneakers   Daily Caffeine Use.            Social Determinants of Health   Financial Resource Strain: Low Risk    Difficulty of Paying Living Expenses: Not hard at all  Food Insecurity: No Food Insecurity   Worried About Charity fundraiser in the Last Year: Never true   [place] in the Last Year: Never true  Transportation Needs: No Transportation Needs   Lack of Transportation (Medical): No   Lack of Transportation (Non-Medical): No  Physical Activity: Sufficiently Active   Days of Exercise per Week: 5 days   Minutes of Exercise per Session: 30 min  Stress: No Stress Concern Present   Feeling of Stress : Not at all  Social Connections: Socially Integrated   Frequency of Communication with Friends and Family: More than three times a week   Frequency of Social Gatherings with Friends and Family: More than three times a week   Attends Religious Services: More than 4 times per year   Active Member of [employer] or Organizations: Yes   Attends Music therapist: More than 4 times per year   Marital Status: Married     Family History: The patient's family history includes Colon cancer (age of onset: 50) in his son; Hypertension in an other family member; Lymphoma in his father; Vision loss in his maternal grandmother. There is no history of Asthma, Esophageal cancer, Rectal cancer, Stomach cancer, or Colon polyps.  ROS:    Please see the history of present illness.    All other systems reviewed and are negative.  EKGs/Labs/Other Studies Reviewed:    The following studies were reviewed today: Cardiac Catheterization 01/31/20: Conclusion  Widely patent left main LAD is relatively small but does reach the left ventricular apex.  Moderate diffuse disease is noted proximally, there is eccentric 50% mid narrowing before a large septal perforator after an acute bend in the LAD before the origin of the last diagonal there is eccentric 75% stenosis in the LAD.  Distal LAD is less than 2 mm in diameter. The circumflex system is codominant with RCA.  The circumflex gives origin to have obtuse marginal branches.  The third obtuse marginal contains ostial 60% narrowing.  The proximal circumflex beyond a 90 degree bend contains a previously placed stent that has diffuse in-stent restenosis up to 50%.  No high-grade obstruction is noted. RCA is a codominant vessel is overlapping Cypher stent from proximal to mid.  The entire stented segment contains diffuse in-stent restenosis up to 50%.  No high-grade focal stenosis is seen.  The nonstented RCA contains moderate diffuse disease. The EDP is 19 mmHg.  Left ventriculography was not performed because of kidney impairment.   RECOMMENDATIONS:   No focal high-grade stenosis.  Stents are patent. Current symptoms are probably not ischemia related. Further management as per the treating team.  Diagnostic Dominance: Co-dominant  Left Anterior Descending  Prox LAD to Mid LAD lesion is 25% stenosed.  Mid LAD lesion is 65% stenosed.  Dist LAD lesion is 75% stenosed.  Left Circumflex  Prox Cx to Mid Cx lesion is 50% stenosed. The lesion was previously treated.  Third Obtuse Marginal Branch  3rd Mrg lesion is 30% stenosed.  Right Coronary Artery  Ost RCA to Mid RCA lesion is 50% stenosed. The lesion was previously treated.   Intervention   No interventions have been  documented.  Left Heart  Left Ventricle LV end diastolic pressure is moderately elevated.   Coronary Diagrams   Diagnostic Dominance: Co-dominant     EKG:  EKG is not ordered today.    Recent Labs: 04/15/2021: TSH 11.04 05/31/2021: ALT 25; BUN 24; Creat 2.42; Hemoglobin 12.6; Platelets 92; Potassium 4.7; Sodium 143  Recent Lipid Panel    Component Value Date/Time   CHOL 115 02/18/2020 0740   TRIG 115 02/18/2020 0740   HDL 34 (L) 02/18/2020 0740   CHOLHDL 3.4 02/18/2020 0740   CHOLHDL 3 05/18/2017 0721   VLDL 16.4 05/18/2017 0721   LDLCALC 60 02/18/2020 0740     Risk Assessment/Calculations:           Physical Exam:    VS:  BP 120/76   Pulse 62   Ht 6' (1.829 m)   Wt 183 lb 3.2 oz (83.1 kg)   SpO2 95%   BMI 24.85 kg/m     Wt Readings from Last 3 Encounters:  06/07/21 183 lb 3.2 oz (83.1 kg)  05/31/21 212 lb (96.2 kg)  05/13/21 185 lb 3.2 oz (84 kg)     GEN:  Well nourished, well developed in no acute distress HEENT: Normal NECK: No JVD; No carotid bruits LYMPHATICS: No lymphadenopathy CARDIAC: RRR, no murmurs, rubs, gallops RESPIRATORY:  Clear to auscultation without rales, wheezing or rhonchi  ABDOMEN: Soft, non-tender, non-distended MUSCULOSKELETAL: 1+ edema on the left leg with stasis changes, dressing over the left shin; No deformity  SKIN: Warm and dry NEUROLOGIC:  Alert and oriented x 3 PSYCHIATRIC:  Normal affect   ASSESSMENT:    1. Coronary artery disease involving native coronary artery of native heart with other form of angina pectoris ([place])   2. Mixed hyperlipidemia   3. Essential hypertension   4. CKD (chronic kidney disease) stage 4, GFR 15-29 ml/min (HCC)    PLAN:    In order of problems listed above:  The patient stable from a cardiac perspective.  Treated with aspirin for antiplatelet therapy, beta-blocker, and a statin drug.  Also on isosorbide for antianginal treatment. Treated with low-dose simvastatin.  LDL cholesterol 60,  total cholesterol 115. 3.   Blood pressure is controlled.  I gave him some parameters for both high and low blood pressure that might indicate need for medication adjustment.  We will communicate with Dr. Moshe Cipro regarding use of Benicar which is appropriately prescribed to reduce risk of progressive kidney disease.  However, with his diarrhea and weight loss, I wonder if this should be held for the period of time. 4.  As above  Medication Adjustments/Labs and Tests Ordered: Current medicines are reviewed at length with the patient today.  Concerns regarding medicines are outlined above.  No orders of the defined types were placed in this encounter.  No orders of the defined types were placed in this encounter.   Patient Instructions  Medication Instructions:  No changes *If you need a refill on your cardiac medications before your next appointment, please call your pharmacy*   Lab Work: none  Testing/Procedures: none  Follow-Up: At [facility], you and your health needs are our priority.  As part of our continuing mission to provide you with exceptional heart care, we have created designated Provider Care Teams.  These Care Teams include your primary Cardiologist (physician) and Advanced Practice Providers (APPs -  Physician Assistants and Nurse Practitioners) who all work together to provide you with the care you need, when you need it.   Your next appointment:  12 month(s)  The format for your next appointment:   In Person  Provider:   You may see Sherren Mocha, MD or one of the following Advanced Practice Providers on your designated Care Team:   [place]son Dopp, PA-C Robbie Lis, [place]   Other Instructions     Signed, Sherren Mocha, MD  06/07/2021 8:46 AM    [place]

## 2021-06-07 NOTE — Telephone Encounter (Signed)
Dr. Tommy Medal okay to move up patient's appointment. RN spoke with patient, he accepts a sooner appointment for 06/08/21 at 9:30 am.

## 2021-06-07 NOTE — Patient Instructions (Signed)
Medication Instructions:  No changes *If you need a refill on your cardiac medications before your next appointment, please call your pharmacy*   Lab Work: none  Testing/Procedures: none  Follow-Up: At [facility], you and your health needs are our priority.  As part of our continuing mission to provide you with exceptional heart care, we have created designated Provider Care Teams.  These Care Teams include your primary Cardiologist (physician) and Advanced Practice Providers (APPs -  Physician Assistants and Nurse Practitioners) who all work together to provide you with the care you need, when you need it.   Your next appointment:   12 month(s)  The format for your next appointment:   In Person  Provider:   You may see Sherren Mocha, MD or one of the following Advanced Practice Providers on your designated Care Team:   Richardson Dopp, PA-C Robbie Lis, [place]   Other Instructions

## 2021-06-08 ENCOUNTER — Telehealth: Payer: Self-pay | Admitting: Internal Medicine

## 2021-06-08 ENCOUNTER — Encounter: Payer: Self-pay | Admitting: Infectious Disease

## 2021-06-08 ENCOUNTER — Ambulatory Visit: Payer: PPO | Admitting: Infectious Disease

## 2021-06-08 ENCOUNTER — Other Ambulatory Visit: Payer: Self-pay

## 2021-06-08 VITALS — BP 120/79 | HR 68 | Temp 98.3°F | Ht 72.0 in | Wt 184.0 lb

## 2021-06-08 DIAGNOSIS — N1832 Chronic kidney disease, stage 3b: Secondary | ICD-10-CM | POA: Diagnosis not present

## 2021-06-08 DIAGNOSIS — L03119 Cellulitis of unspecified part of limb: Secondary | ICD-10-CM

## 2021-06-08 DIAGNOSIS — L309 Dermatitis, unspecified: Secondary | ICD-10-CM | POA: Diagnosis not present

## 2021-06-08 DIAGNOSIS — R29898 Other symptoms and signs involving the musculoskeletal system: Secondary | ICD-10-CM

## 2021-06-08 DIAGNOSIS — I25709 Atherosclerosis of coronary artery bypass graft(s), unspecified, with unspecified angina pectoris: Secondary | ICD-10-CM | POA: Diagnosis not present

## 2021-06-08 DIAGNOSIS — R609 Edema, unspecified: Secondary | ICD-10-CM

## 2021-06-08 DIAGNOSIS — F411 Generalized anxiety disorder: Secondary | ICD-10-CM

## 2021-06-08 DIAGNOSIS — K8689 Other specified diseases of pancreas: Secondary | ICD-10-CM

## 2021-06-08 DIAGNOSIS — L270 Generalized skin eruption due to drugs and medicaments taken internally: Secondary | ICD-10-CM

## 2021-06-08 HISTORY — DX: Dermatitis, unspecified: L30.9

## 2021-06-08 NOTE — Progress Notes (Signed)
Subjective:   Chief complaint: Turns about his left leg still nd concerns about cellulitis recurring so continued concerns about the fact that his left leg is giving out from under him frequently and his family is concerned about how safely he can walk with his walker    Patient ID: Scott Oneal, male    DOB: 04-Aug-1941, 80 y.o.   MRN: [id number]  Scott Oneal is an 80 year old Caucasian man with multiple medical problems including chronic diarrhea that sounds as if it is due to pancreatic insufficiency as he says it is improved now that he began pancreatic enzyme replacement.  He also has coronary artery disease and a history of cholelithiasis and also a lifelong history of kidney stones.  Scott Oneal tells me that he first developed problems with his left lower extremity this June.  He had been working outside extensively in the yard and came back in and noticed erythema and swelling of his leg.  He lay back in recliner and fell asleep that evening.  The next day when he attempted to get out of bed he was too weak to get out of the recliner.  He was ultimately hospitalized at [facility] and stayed there for 4 days.  I do not have access to his medical records but he shows me a photograph of one of the antibiotics he was given as an inpatient which was in fact vancomycin.  He was ultimately discharged on doxycycline and Augmentin and saw his primary care physician Dr. Alain Marion on April 05, 2021.  According to Dr. Alain Marion the patient had relapse of his left lower extremity redness with pain and swelling.  This [place]  [name] tried to acquire Elesa Hacker for the patient but it was cost-prohibitive.   Doxycycline was given in the interim.  He was referred to [place] in infectious disease.  Tells me that he did not have much in the improvement in the way of his erythema while on the doxycycline.  He has now developed a rash all over his forearms and his face  that is highly consistent with a sun exposure rash on doxycycline at his visit is quite painful and quite erythematous in the sun exposed regions.  His diarrhea proved while he has been on his pancreatic replacement enzymes.  In questioning him he also mentions history of his left leg giving out from underneath him at times.  He has had an MRI of the lumbar spine this spring but not since he began having the symptoms of his leg giving out from underneath him as he describes them to me today.  His left lower extremity did havesome erythema and is quite tender in some of the areas around this.  He did not show evidence of systemic infection with fevers chills nausea vomiting or anorexia.  Did when I saw him at his first visit have an obvious doxycycline sun exposure rash on his arms and face.  I had him stop the doxycycline.  I ordered plain films of his leg which I personally reviewed show no evidence of osteomyelitis.  Films of the spine are also ordered and is reviewed personally and they show degenerative disc disease and facet hypertrophy in the lumbar spine with bilateral kidney stones.  Was able to review records from the hospital in [place] where he was hospitalized he was indeed discharged on Augmentin after having been on vancomycin.  I did not see blood culture data when  I look through the records that were faxed to's to [place] in talking to him today does not sound like blood cultures were drawn.  Scheduled to see me in several weeks time but was concerned over the weekend when there was an area on the posterior of his leg that became more erythematous.  He is still bothered also on some about some areas on the anterior the legs that are still erythematous.  I note these areas do not blanch and I suspect they may be potential damaged minor blood vessels.      Past Medical History:  Diagnosis Date   Allergy    Anxiety    Arthritis    Asthma    Benign neoplasm of colon     Calculus of gallbladder without mention of cholecystitis    Cataract    bilateral cateracts removed   Celiac disease    Cholelithiasis    Coronary atherosclerosis of unspecified type of vessel, native or graft    Diarrhea    Esophageal reflux    Glaucoma    Gout, unspecified    Hyperparathyroidism    Long term (current) use of anticoagulants    Loss of weight    Lower extremity weakness 05/31/2021   Old myocardial infarction    Osteoporosis, unspecified    Other malaise and fatigue    Other specified cardiac dysrhythmias(427.89)    Other testicular hypofunction    Pancreatic insufficiency 05/31/2021   Personal history of colonic polyps    Pure hypercholesterolemia    Renal insufficiency    chronic   Tinea pedis 05/31/2021   Unspecified asthma(493.90)    Unspecified essential hypertension    Unspecified hypothyroidism     Past Surgical History:  Procedure Laterality Date   cataract surgery Bilateral    COLONOSCOPY  06/26/2017   [facility]   COLONOSCOPY WITH PROPOFOL N/A 11/27/2014   Procedure: COLONOSCOPY WITH PROPOFOL;  Surgeon: Milus Banister, MD;  Location: [place];  Service: Endoscopy;  Laterality: N/A;   CORONARY ANGIOPLASTY     CORONARY STENT PLACEMENT  2011   Cypher; in distal circumflex artery   CYSTOSCOPY  1998   EYE SURGERY     KNEE SURGERY     right   LEFT HEART CATH AND CORONARY ANGIOGRAPHY N/A 01/31/2020   Procedure: LEFT HEART CATH AND CORONARY ANGIOGRAPHY;  Surgeon: Belva Crome, MD;  Location: [place] [facility];  Service: Cardiovascular;  Laterality: N/A;   SHOULDER SURGERY Left 9/14   Dr Shara Blazing   UPPER GASTROINTESTINAL ENDOSCOPY     VASCULAR SURGERY      Family History  Problem Relation Age of Onset   Lymphoma Father    Hypertension Other    Vision loss Maternal Grandmother    Colon cancer Son 3   Asthma Neg Hx    Esophageal cancer Neg Hx    Rectal cancer Neg Hx    Stomach cancer Neg Hx    Colon polyps Neg Hx       Social History    Socioeconomic History   Marital status: Married    Spouse name: Marck Mcclenny   Number of children: 3   Years of education: Not on file   Highest education level: Not on file  Occupational History   Occupation: retired    Fish farm manager: RETIRED    Comment: Engineering  Tobacco Use   Smoking status: Never   Smokeless tobacco: Never  Vaping Use   Vaping Use: Never used  Substance and Sexual  Activity   Alcohol use: No   Drug use: No   Sexual activity: Yes  Other Topics Concern   Not on file  Social History Narrative   Retired.   Regular Exercise-Yes; yoga & silver sneakers   Daily Caffeine Use.            Social Determinants of Health   Financial Resource Strain: Low Risk    Difficulty of Paying Living Expenses: Not hard at all  Food Insecurity: No Food Insecurity   Worried About Charity fundraiser in the Last Year: Never true   [place] in the Last Year: Never true  Transportation Needs: No Transportation Needs   Lack of Transportation (Medical): No   Lack of Transportation (Non-Medical): No  Physical Activity: Sufficiently Active   Days of Exercise per Week: 5 days   Minutes of Exercise per Session: 30 min  Stress: No Stress Concern Present   Feeling of Stress : Not at all  Social Connections: Socially Integrated   Frequency of Communication with Friends and Family: More than three times a week   Frequency of Social Gatherings with Friends and Family: More than three times a week   Attends Religious Services: More than 4 times per year   Active Member of [employer] or Organizations: Yes   Attends Music therapist: More than 4 times per year   Marital Status: Married    Allergies  Allergen Reactions   Biaxin [Clarithromycin]     Dizziness   Allantoin    Metoprolol    Tizanidine     Felt funny   Allantoin-Pramoxine Other (See Comments)    Pt does't remember reaction   Amlodipine     Edema    Benzalkonium Chloride     Other reaction(s): mild  rash/itching   Doxycycline Photosensitivity    rash   Metoprolol Tartrate Other (See Comments)    REACTION: fatigue     Current Outpatient Medications:    aspirin 81 MG tablet, Take 81 mg by mouth every evening. , Disp: , Rfl:    calcitRIOL (ROCALTROL) 0.25 MCG capsule, TAKE 1 CAPSULE BY MOUTH EVERY DAY, Disp: 90 capsule, Rfl: 1   carvedilol (COREG) 25 MG tablet, TAKE 1 TABLET BY MOUTH TWICE A DAY WITH MEALS, Disp: 180 tablet, Rfl: 3   cetirizine (ZYRTEC) 10 MG tablet, Take 10 mg by mouth daily as needed for allergies., Disp: , Rfl:    clonazePAM (KLONOPIN) 0.25 MG disintegrating tablet, TAKE 1 TABLET (0.25 MG TOTAL) BY MOUTH 2 (TWO) TIMES DAILY AS NEEDED (ANXIETY)., Disp: 60 tablet, Rfl: 3   diclofenac sodium (VOLTAREN) 1 % GEL, APPLY 4 G TOPICALLY 4 TIMES DAILY., Disp: 200 g, Rfl: 5   hydrALAZINE (APRESOLINE) 10 MG tablet, Take 1-2 tablet 3 times a day as needed if blood pressure is >170, Disp: 60 tablet, Rfl: 3   isosorbide mononitrate (IMDUR) 30 MG 24 hr tablet, TAKE 1 TABLET BY MOUTH EVERY DAY, Disp: 90 tablet, Rfl: 2   levothyroxine (SYNTHROID) 175 MCG tablet, Take 1 tablet (175 mcg total) by mouth daily., Disp: 90 tablet, Rfl: 3   lipase/protease/amylase (CREON) 36000 UNITS CPEP capsule, Take 2 capsules (72,000 Units total) by mouth 3 (three) times daily with meals. May also take 1 capsule (36,000 Units total) as needed (with snacks)., Disp: 240 capsule, Rfl: 11   Multiple Vitamin (MULTIVITAMIN) capsule, Take 1 capsule by mouth daily., Disp: , Rfl:    Multiple Vitamins-Minerals (PRESERVISION AREDS 2) CAPS, Take  1 capsule by mouth in the morning and at bedtime., Disp: , Rfl:    nitroGLYCERIN (NITROSTAT) 0.4 MG SL tablet, Place 0.4 mg under the tongue every 5 (five) minutes as needed for chest pain., Disp: , Rfl:    olmesartan (BENICAR) 5 MG tablet, TAKE 1 TABLET BY MOUTH EVERY DAY, Disp: 90 tablet, Rfl: 3   pantoprazole (PROTONIX) 40 MG tablet, Take 1 tablet (40 mg total) by mouth  daily., Disp: 30 tablet, Rfl: 11   Propylene Glycol (SYSTANE BALANCE) 0.6 % SOLN, Place 1 drop into both eyes daily as needed (Dry eye). systane balance, Disp: , Rfl:    saccharomyces boulardii (FLORASTOR) 250 MG capsule, Take 1 capsule (250 mg total) by mouth 2 (two) times daily., Disp: 60 capsule, Rfl: 1   simvastatin (ZOCOR) 10 MG tablet, TAKE 1 TABLET BY MOUTH EVERYDAY AT BEDTIME, Disp: 90 tablet, Rfl: 3   SYRINGE-NEEDLE, DISP, 3 ML (BD ECLIPSE SYRINGE) 23G X 1" 3 ML MISC, As directed IM, Disp: 50 each, Rfl: 2   tamsulosin (FLOMAX) 0.4 MG CAPS capsule, Take 0.4 mg by mouth., Disp: , Rfl:    testosterone cypionate (DEPOTESTOSTERONE CYPIONATE) 200 MG/ML injection, INJECT 2ML INTO MUSCLE EVERY 2 WEEKS, Disp: 12 mL, Rfl: 1   UNABLE TO FIND, Med Name: [facility] take 2 tablets by mouth daily, Disp: , Rfl:    Ascorbic Acid (VITAMIN C PO), Take 1 tablet by mouth every other day. (Patient not taking: Reported on 06/08/2021), Disp: , Rfl:    B Complex Vitamins (B COMPLEX PO), Take by mouth. Take 1 tablet three times a day (Patient not taking: Reported on 06/08/2021), Disp: , Rfl:    Cholecalciferol 2000 UNITS TABS, Take 2,000 Units by mouth daily.  (Patient not taking: Reported on 06/08/2021), Disp: , Rfl:    TURMERIC PO, Take 1 capsule by mouth 3 (three) times daily. (Patient not taking: Reported on 06/08/2021), Disp: , Rfl:    Review of Systems  Constitutional:  Negative for activity change, appetite change, chills, diaphoresis, fatigue, fever and unexpected weight change.  HENT:  Negative for congestion, rhinorrhea, sinus pressure, sneezing, sore throat and trouble swallowing.   Eyes:  Negative for photophobia and visual disturbance.  Respiratory:  Negative for cough, chest tightness, shortness of breath, wheezing and stridor.   Cardiovascular:  Negative for chest pain, palpitations and leg swelling.  Gastrointestinal:  Positive for diarrhea. Negative for abdominal distention, abdominal pain, anal  bleeding, blood in stool, constipation, nausea and vomiting.  Genitourinary:  Negative for difficulty urinating, dysuria, flank pain and hematuria.  Musculoskeletal:  Positive for myalgias. Negative for arthralgias, back pain, gait problem and joint swelling.  Skin:  Positive for color change and rash. Negative for pallor and wound.  Neurological:  Positive for weakness. Negative for dizziness, tremors and light-headedness.  Hematological:  Negative for adenopathy. Does not bruise/bleed easily.  Psychiatric/Behavioral:  Negative for agitation, behavioral problems, confusion, decreased concentration, dysphoric mood and sleep disturbance.       Objective:   Physical Exam Constitutional:      Appearance: He is well-developed.  HENT:     Head: Normocephalic and atraumatic.  Eyes:     General:        Right eye: No discharge.        Left eye: No discharge.     Conjunctiva/sclera: Conjunctivae normal.  Cardiovascular:     Rate and Rhythm: Normal rate and regular rhythm.  Pulmonary:     Effort: Pulmonary effort is normal. No  respiratory distress.     Breath sounds: No wheezing.  Abdominal:     General: There is no distension.     Palpations: Abdomen is soft.  Musculoskeletal:        General: Normal range of motion.     Cervical back: Normal range of motion and neck supple.  Skin:    General: Skin is warm and dry.     Coloration: Skin is not jaundiced or pale.     Findings: Erythema and rash present. No bruising.  Neurological:     General: No focal deficit present.     Mental Status: He is alert and oriented to person, place, and time.  Psychiatric:        Mood and Affect: Mood normal.        Behavior: Behavior normal.        Thought Content: Thought content normal.        Judgment: Judgment normal.    Left leg  05/31/2021: he is tender near the area where there was bleeding    Leg 06/08/2021: Continues to be tender to palpation of the skin in this leg.   Posteriorly         Possible tinea on right foot     Rash from doxy in sun exposed areas 05/31/2021:    Rash 06/08/2021:        Assessment & Plan:   History of cellulitis of the left leg:  Have no doubt that he had a serious infection in this leg with systemic symptoms that required hospitalization.  I do not find much evidence that he has cellulitis at present.  He does have some tenderness and it is conceivable that he could have a deep infection such as pyomyositis though I am skeptical of this as well.  I have offered an MRI to exclude deep infection and have ordered this today.  I would like to continue to withhold antibiotics and I will see him again next week to make sure that he is still showing no evidence of recurrent cellulitis.  As I mentioned in my last note if he has further episodes of cellulitis he would benefit from having a prescription of Keflex or amoxicillin versus Augmentin on hand at home to initiate the first signs of cellulitis.  Note I would use these antibiotics rather than anti-MRSA antibiotics since his story is more consistent with a nonpurulent cellulitis  Left lower extremity weakness: I will repeat the MRI of the lumbar spine given that since he had the last imaging his weakness has worsened and he did have a serious infection that could have potentially gotten into his bloodstream and seeded the site.  He does have stenosis seen on imaging of MRI in March 2022.  I also offered to refer him to Neurology but he would like to get imaging first.  Anxiety: Certainly I think some of what is driving this is anxiety though this is certainly well founded given his recent hospitalization.    Dermatitis on his right foot: This is not responded to clotrimazole he may want to consider a corticosteroid cream.  Doxycycline sun exposure rash: This is resolving.   I spent 42  minutes with the patient including face to face counseling of the patient the nature of  cellulitis with systemic symptoms such as he had when he was hospitalized, the nature of venous stasis dermatitis the risk for recurrent cellulitis, going over his lower extremity weakness, reviewing the x-ray of the spine and of  his leg that I ordered at last visit,  review of medical records clearing from [facility] before and during the visit and in coordination of his care.

## 2021-06-08 NOTE — Chronic Care Management (AMB) (Signed)
  Chronic Care Management   Outreach Note  06/08/2021 Name: BRAYLAN FAUL MRN: [id number] DOB: February 04, 1941  Referred by: Cassandria Anger, MD Reason for referral : No chief complaint on file.   An unsuccessful telephone outreach was attempted today. The patient was referred to the pharmacist for assistance with care management and care coordination.   Follow Up Plan:   Lauretta Grill Upstream Scheduler

## 2021-06-09 ENCOUNTER — Encounter: Payer: Self-pay | Admitting: Gastroenterology

## 2021-06-09 ENCOUNTER — Ambulatory Visit (INDEPENDENT_AMBULATORY_CARE_PROVIDER_SITE_OTHER): Payer: PPO | Admitting: Gastroenterology

## 2021-06-09 VITALS — BP 130/70 | HR 75 | Ht 68.25 in | Wt 186.1 lb

## 2021-06-09 DIAGNOSIS — K8689 Other specified diseases of pancreas: Secondary | ICD-10-CM

## 2021-06-09 DIAGNOSIS — T465X5A Adverse effect of other antihypertensive drugs, initial encounter: Secondary | ICD-10-CM

## 2021-06-09 NOTE — Progress Notes (Signed)
    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  He has been treated for left leg cellulitis and recently completed antibiotics.  After beginning Creon and a fat modified diet he has gained weight and his diarrhea has completely abated.  He is currently having 1 soft formed stool daily.  His appetite is excellent.  He was evaluated by Dr. Burt Knack on August 8 and I have reviewed his office note.  Current Medications, Allergies, Past Medical History, Past Surgical History, Family History and Social History were reviewed in [employer] record.   Physical Exam: General: Well developed, well nourished, no acute distress Head: Normocephalic and atraumatic Eyes: Sclerae anicteric, EOMI Ears: Normal auditory acuity Mouth: Not examined, mask on during Covid-19 pandemic Lungs: Clear throughout to auscultation Heart: Regular rate and rhythm; no murmurs, rubs or bruits Abdomen: Soft, non tender and non distended. No masses, hepatosplenomegaly or hernias noted. Normal Bowel sounds Rectal: Not done  Musculoskeletal: Symmetrical with no gross deformities  Pulses:  Normal pulses noted Extremities: No clubbing, cyanosis, edema or deformities noted Neurological: Alert oriented x 4, grossly nonfocal Psychological:  Alert and cooperative. Normal mood and affect   Assessment and Recommendations:  Pancreatic insufficieny. Continue Creon 36,000U 2 before meals, 1 before snacks long term. DC pantoprazole. DC Florastor. Can resume Florastor if he needs to resume antibiotics. REV in 3 months.   Duodenal changes related to IgA deficiency, celiac disease or early changes related to olmesartan enteropathy.  The pathologist felt the duodenal changes on 03/2021 were not sufficient to diagnose celiac disease or olmesartan celiac-like enteropathy however I am concerned the changes indicate a  developing enteropathy related to olmesartan or chronic mild changes of celiac disease. Celiac disease has been previously diagnosed however reviewing the pathology it has been less than definitive. He has been advised to continue to follow a gluten free diet.  If olmesartan could safely be discontinued this potential cause of enteropathy would be eliminated however given that this diagnosis is not firmly established will defer decision to Drs. Cooper and Wickes.  If he develops worsening symptoms of diarrhea, malabsorption we will need to reconsider discontinuing olmesartan or repeating his EGD with duodenal biopsies. REV in 3 months.   3.   Personal history of adenomatous colon polyps.  Last colonoscopy performed in January 2022.  No plans for future surveillance colonoscopies due to age and absence of precancerous polyps on his last colonoscopy.  4.   Cholelithiasis.  Asymptomatic.

## 2021-06-09 NOTE — Patient Instructions (Signed)
Discontinue pantoprazole and Florastor.   If you restart any antibiotics, also restart Florastor daily.   We will be in contact with you regarding your Benicar.   Due to recent changes in healthcare laws, you may see the results of your imaging and laboratory studies on MyChart before your provider has had a chance to review them.  We understand that in some cases there may be results that are confusing or concerning to you. Not all laboratory results come back in the same time frame and the provider may be waiting for multiple results in order to interpret others.  Please give [place] 48 hours in order for your provider to thoroughly review all the results before contacting the office for clarification of your results.   The [facility] providers would like to encourage you to use [facility] to communicate with providers for non-urgent requests or questions.  Due to long hold times on the telephone, sending your provider a message by [facility] may be a faster and more efficient way to get a response.  Please allow 48 business hours for a response.  Please remember that this is for non-urgent requests.   Thank you for choosing me and [facility].  Pricilla Riffle. Dagoberto Ligas., MD., Marval Regal

## 2021-06-10 ENCOUNTER — Telehealth: Payer: Self-pay | Admitting: Gastroenterology

## 2021-06-10 NOTE — Telephone Encounter (Signed)
Inbound call from patient requesting a call back about Creon. States it is over $660 for one month. Asked if there is something else similar, generic, or samples. Best contact number [phone]

## 2021-06-10 NOTE — Telephone Encounter (Signed)
Patient states he is in the donut hole with his insurance company and Creon is $660. Patient states he wondered if we had samples or another medication he can take in it's place. Informed patient that we do have samples of Creon that I can leave at the front desk until we can figure out what do regarding his prescription price.

## 2021-06-11 NOTE — Telephone Encounter (Signed)
Patient is returning your call.  

## 2021-06-11 NOTE — Telephone Encounter (Signed)
Informed patient he can try to apply for patient assistance for Creon but I am not sure he will qualify. Offered patient to print out forms for patient assistance and start the application process. Patient states he will go on the web site and see if he qualifies first before starting paperwork. Patient states he will contact me next week if he qualifies.

## 2021-06-11 NOTE — Telephone Encounter (Signed)
Left message for patient to return my call.

## 2021-06-12 ENCOUNTER — Other Ambulatory Visit: Payer: Self-pay

## 2021-06-12 ENCOUNTER — Ambulatory Visit (HOSPITAL_COMMUNITY)
Admission: RE | Admit: 2021-06-12 | Discharge: 2021-06-12 | Disposition: A | Discharge status: Home / Self Care | Payer: PPO | Source: Ambulatory Visit | Attending: Infectious Disease | Admitting: Infectious Disease

## 2021-06-12 DIAGNOSIS — M7989 Other specified soft tissue disorders: Secondary | ICD-10-CM | POA: Diagnosis not present

## 2021-06-12 DIAGNOSIS — L03119 Cellulitis of unspecified part of limb: Secondary | ICD-10-CM

## 2021-06-12 DIAGNOSIS — R29898 Other symptoms and signs involving the musculoskeletal system: Secondary | ICD-10-CM | POA: Diagnosis not present

## 2021-06-12 DIAGNOSIS — R600 Localized edema: Secondary | ICD-10-CM | POA: Diagnosis not present

## 2021-06-12 DIAGNOSIS — Z872 Personal history of diseases of the skin and subcutaneous tissue: Secondary | ICD-10-CM | POA: Diagnosis not present

## 2021-06-12 DIAGNOSIS — M545 Low back pain, unspecified: Secondary | ICD-10-CM | POA: Diagnosis not present

## 2021-06-15 ENCOUNTER — Telehealth: Payer: Self-pay | Admitting: Lab

## 2021-06-15 NOTE — Progress Notes (Addendum)
COVID swab appointment: 06/17/21  COVID Vaccine Completed: yes x4 Date COVID Vaccine completed: 11/20/19, 12/11/19 Has received booster: 08/17/20, 02/18/21 COVID vaccine manufacturer: [employer]       Date of COVID positive in last 90 days: No  PCP - Tyrone Apple Plotnikov,MD Cardiologist - Talbot Grumbling, MD  Chest x-ray - N/a EKG - 08/19/20 Epic Stress Test - 01/21/20 Epic ECHO - 04/01/21 Epic Cardiac Cath - 01/31/20 Epic, has stents Pacemaker/ICD device last checked: N/a Spinal Cord Stimulator: N/a  Sleep Study - N/a CPAP -   Fasting Blood Sugar - N/a Checks Blood Sugar _____ times a day  Blood Thinner Instructions: Aspirin Instructions: ASA 81, no instructions given. Instructed to ask PCP Last Dose:  Activity level: Can go up a flight of stairs and perform activities of daily living without stopping and without symptoms of chest pain or shortness of breath.     Anesthesia review: MI, HTN, asthma, Creatinine 2.32, platelets 90, no clearance   Patient denies shortness of breath, fever, cough and chest pain at PAT appointment   Patient verbalized understanding of instructions that were given to them at the PAT appointment. Patient was also instructed that they will need to review over the PAT instructions again at home before surgery.

## 2021-06-15 NOTE — Progress Notes (Signed)
  Chronic Care Management   Outreach Note  06/15/2021 Name: Scott Oneal MRN: [id number] DOB: 08-10-1941  Referred by: Cassandria Anger, MD Reason for referral : Medication Management   A second unsuccessful telephone outreach was attempted today. The patient was referred to pharmacist for assistance with care management and care coordination.  Follow Up Plan:   [place]

## 2021-06-16 ENCOUNTER — Encounter (HOSPITAL_COMMUNITY): Payer: Self-pay

## 2021-06-16 ENCOUNTER — Encounter (HOSPITAL_COMMUNITY)
Admission: RE | Admit: 2021-06-16 | Discharge: 2021-06-16 | Disposition: A | Discharge status: Home / Self Care | Payer: PPO | Source: Ambulatory Visit | Attending: Urology | Admitting: Urology

## 2021-06-16 ENCOUNTER — Other Ambulatory Visit: Payer: Self-pay

## 2021-06-16 DIAGNOSIS — N202 Calculus of kidney with calculus of ureter: Secondary | ICD-10-CM | POA: Diagnosis not present

## 2021-06-16 DIAGNOSIS — Z01812 Encounter for preprocedural laboratory examination: Secondary | ICD-10-CM | POA: Diagnosis not present

## 2021-06-16 DIAGNOSIS — N183 Chronic kidney disease, stage 3 unspecified: Secondary | ICD-10-CM | POA: Insufficient documentation

## 2021-06-16 DIAGNOSIS — I129 Hypertensive chronic kidney disease with stage 1 through stage 4 chronic kidney disease, or unspecified chronic kidney disease: Secondary | ICD-10-CM | POA: Diagnosis not present

## 2021-06-16 DIAGNOSIS — Z79899 Other long term (current) drug therapy: Secondary | ICD-10-CM | POA: Insufficient documentation

## 2021-06-16 DIAGNOSIS — I2510 Atherosclerotic heart disease of native coronary artery without angina pectoris: Secondary | ICD-10-CM | POA: Diagnosis not present

## 2021-06-16 DIAGNOSIS — N200 Calculus of kidney: Secondary | ICD-10-CM | POA: Diagnosis not present

## 2021-06-16 DIAGNOSIS — Z7901 Long term (current) use of anticoagulants: Secondary | ICD-10-CM | POA: Diagnosis not present

## 2021-06-16 DIAGNOSIS — Z7982 Long term (current) use of aspirin: Secondary | ICD-10-CM | POA: Diagnosis not present

## 2021-06-16 HISTORY — DX: Unspecified macular degeneration: H35.30

## 2021-06-16 HISTORY — DX: Thrombocytopenia, unspecified: D69.6

## 2021-06-16 HISTORY — DX: Chronic kidney disease, unspecified: N18.9

## 2021-06-16 LAB — CBC
HCT: 38.3 % — ABNORMAL LOW (ref 39.0–52.0)
Hemoglobin: 12.1 g/dL — ABNORMAL LOW (ref 13.0–17.0)
MCH: 30 pg (ref 26.0–34.0)
MCHC: 31.6 g/dL (ref 30.0–36.0)
MCV: 95 fL (ref 80.0–100.0)
Platelets: 90 10*3/uL — ABNORMAL LOW (ref 150–400)
RBC: 4.03 MIL/uL — ABNORMAL LOW (ref 4.22–5.81)
RDW: 15.8 % — ABNORMAL HIGH (ref 11.5–15.5)
WBC: 5.7 10*3/uL (ref 4.0–10.5)
nRBC: 0 % (ref 0.0–0.2)

## 2021-06-16 LAB — BASIC METABOLIC PANEL
Anion gap: 8 (ref 5–15)
BUN: 21 mg/dL (ref 8–23)
CO2: 30 mmol/L (ref 22–32)
Calcium: 8.9 mg/dL (ref 8.9–10.3)
Chloride: 103 mmol/L (ref 98–111)
Creatinine, Ser: 2.32 mg/dL — ABNORMAL HIGH (ref 0.61–1.24)
GFR, Estimated: 28 mL/min — ABNORMAL LOW (ref >60)
Glucose, Bld: 118 mg/dL — ABNORMAL HIGH (ref 70–99)
Potassium: 4.8 mmol/L (ref 3.5–5.1)
Sodium: 141 mmol/L (ref 135–145)

## 2021-06-16 NOTE — Progress Notes (Signed)
Platelets resulted at 90 and creatinine 2.32. Results sent to Dr. Diona Fanti.

## 2021-06-16 NOTE — Patient Instructions (Addendum)
DUE TO COVID-19 ONLY ONE VISITOR IS ALLOWED TO COME WITH YOU AND STAY IN THE WAITING ROOM ONLY DURING PRE OP AND PROCEDURE.   **NO VISITORS ARE ALLOWED IN THE SHORT STAY AREA OR RECOVERY ROOM!!**  IF YOU WILL BE ADMITTED INTO THE HOSPITAL YOU ARE ALLOWED ONLY TWO SUPPORT PEOPLE DURING VISITATION HOURS ONLY (10AM -8PM)   The support person(s) may change daily. The support person(s) must pass our screening, gel in and out, and wear a mask at all times, including in the patient's room. Patients must also wear a mask when staff or their support person are in the room.  No visitors under the age of 52. Any visitor under the age of 52 must be accompanied by an adult.    COVID SWAB TESTING MUST BE COMPLETED ON:  06/17/21 **MUST PRESENT COMPLETED FORM AT TESTING SITE**    [address] (backside of the building) No appointment needed. Open 8am-3pm. You are not required to quarantine, however you are required to wear a well-fitted mask when you are out and around people not in your household.  Hand Hygiene often Do NOT share personal items Notify your provider if you are in close contact with someone who has COVID or you develop fever 100.4 or greater, new onset of sneezing, cough, sore throat, shortness of breath or body aches.       Your procedure is scheduled on: 06/21/21   Report to [facility] Main  Entrance    Report to admitting at 8:30 AM   Call this number if you have problems the morning of surgery [phone]   Do not eat food :After Midnight.   May have liquids until  7:45 AM day of surgery  CLEAR LIQUID DIET  Foods Allowed                                                                     Foods Excluded  Water, Black Coffee and tea, regular and decaf                liquids that you cannot  Plain Jell-O in any flavor  (No red)                                      see through such as: Fruit ices (not with fruit pulp)                                               milk, soups, orange juice              Iced Popsicles (No red)                                                 All solid food  Apple juices Sports drinks like Gatorade (No red) Lightly seasoned clear broth or consume(fat free) Sugar, honey syrup   Oral Hygiene is also important to reduce your risk of infection.                                    Remember - BRUSH YOUR TEETH THE MORNING OF SURGERY WITH YOUR REGULAR TOOTHPASTE   Take these medicines the morning of surgery with A SIP OF WATER: Coreg, Zyrtec, Klonopin, Hydralazine, IMDUR, Levothyroxine, Protonix, Flomax                              You may not have any metal on your body including jewelry, and body piercing             Do not wear lotions, powders, cologne, or deodorant              Men may shave face and neck.   Do not bring valuables to the hospital. [place].   Bring small overnight bag day of surgery.  Special Instructions: Bring a copy of your healthcare power of attorney and living will documents         the day of surgery if you haven't scanned them in before.   Please read over the following fact sheets you were given: IF YOU HAVE QUESTIONS ABOUT YOUR PRE OP INSTRUCTIONS PLEASE CALL [facility] - Preparing for Surgery Before surgery, you can play an important role.  Because skin is not sterile, your skin needs to be as free of germs as possible.  You can reduce the number of germs on your skin by washing with CHG (chlorahexidine gluconate) soap before surgery.  CHG is an antiseptic cleaner which kills germs and bonds with the skin to continue killing germs even after washing. Please DO NOT use if you have an allergy to CHG or antibacterial soaps.  If your skin becomes reddened/irritated stop using the CHG and inform your nurse when you arrive at Short Stay. Do not shave (including legs and underarms)  for at least 48 hours prior to the first CHG shower.  You may shave your face/neck.  Please follow these instructions carefully:  1.  Shower with CHG Soap the night before surgery and the  morning of surgery.  2.  If you choose to wash your hair, wash your hair first as usual with your normal  shampoo.  3.  After you shampoo, rinse your hair and body thoroughly to remove the shampoo.                             4.  Use CHG as you would any other liquid soap.  You can apply chg directly to the skin and wash.  Gently with a scrungie or clean washcloth.  5.  Apply the CHG Soap to your body ONLY FROM THE NECK DOWN.   Do   not use on face/ open                           Wound or open sores. Avoid contact with eyes, ears mouth and   genitals (private parts).  Wash face,  Genitals (private parts) with your normal soap.             6.  Wash thoroughly, paying special attention to the area where your    surgery  will be performed.  7.  Thoroughly rinse your body with warm water from the neck down.  8.  DO NOT shower/wash with your normal soap after using and rinsing off the CHG Soap.                9.  Pat yourself dry with a clean towel.            10.  Wear clean pajamas.            11.  Place clean sheets on your bed the night of your first shower and do not  sleep with pets. Day of Surgery : Do not apply any lotions/deodorants the morning of surgery.  Please wear clean clothes to the hospital/surgery center.  FAILURE TO FOLLOW THESE INSTRUCTIONS MAY RESULT IN THE CANCELLATION OF YOUR SURGERY  PATIENT SIGNATURE_________________________________  NURSE SIGNATURE__________________________________  ________________________________________________________________________

## 2021-06-17 ENCOUNTER — Other Ambulatory Visit: Payer: Self-pay | Admitting: Urology

## 2021-06-17 ENCOUNTER — Encounter (HOSPITAL_COMMUNITY): Payer: Self-pay

## 2021-06-17 LAB — SARS CORONAVIRUS 2 (TAT 6-24 HRS): SARS Coronavirus 2: NEGATIVE

## 2021-06-17 NOTE — Anesthesia Preprocedure Evaluation (Addendum)
Anesthesia Evaluation  Patient identified by MRN, date of birth, ID band Patient awake    Reviewed: Allergy & Precautions, NPO status , Patient's Chart, lab work & pertinent test results  History of Anesthesia Complications Negative for: history of anesthetic complications  Airway Mallampati: II  TM Distance: >3 FB Neck ROM: Full    Dental  (+) Missing, Dental Advisory Given   Pulmonary asthma ,    Pulmonary exam normal        Cardiovascular hypertension, Pt. on home beta blockers and Pt. on medications + CAD and + Cardiac Stents  Normal cardiovascular exam  Echo 04/01/21:  1. Mild to moderate LVH, normal LV systolic function. LV compliance is reduced. EF 55% 2. Mild MR 3. Mild TR, mild elevation in R heart pressure  Cardiac cath 01/31/20:  ? Widely patent left main ? LAD is relatively small but does reach the left ventricular apex. Moderate diffuse disease is noted proximally, there is eccentric 50% mid narrowing before a large septal perforator after an acute bend in the LAD before the origin of the last diagonal there is eccentric 75% stenosis in the LAD. Distal LAD is less than 2 mm in diameter. ? The circumflex system is codominant with RCA. The circumflex gives origin to have obtuse marginal branches. The third obtuse marginal contains ostial 60% narrowing. The proximal circumflex beyond a 90 degree bend contains a previously placed stent that has diffuse in-stent restenosis up to 50%. No high-grade obstruction is noted. ? RCA is a codominant vessel is overlapping Cypher stent from proximal to mid. The entire stented segment contains diffuse in-stent restenosis up to 50%. No high-grade focal stenosis is seen. The nonstented RCA contains moderate diffuse disease. ? The EDP is 19 mmHg. Left ventriculography was not performed because of kidney impairment.   Neuro/Psych PSYCHIATRIC DISORDERS Anxiety negative neurological  ROS     GI/Hepatic Neg liver ROS, GERD  ,  Endo/Other  Hypothyroidism   Renal/GU Renal InsufficiencyRenal disease     Musculoskeletal negative musculoskeletal ROS (+)   Abdominal   Peds  Hematology negative hematology ROS (+)   Anesthesia Other Findings   Reproductive/Obstetrics                           Anesthesia Physical Anesthesia Plan  ASA: 3  Anesthesia Plan: General   Post-op Pain Management:    Induction: Intravenous  PONV Risk Score and Plan: 2 and Ondansetron and Dexamethasone  Airway Management Planned: LMA  Additional Equipment:   Intra-op Plan:   Post-operative Plan: Extubation in OR  Informed Consent: I have reviewed the patients History and Physical, chart, labs and discussed the procedure including the risks, benefits and alternatives for the proposed anesthesia with the patient or authorized representative who has indicated his/her understanding and acceptance.     Dental advisory given  Plan Discussed with: Anesthesiologist and CRNA  Anesthesia Plan Comments: (See APP note by Durel Salts, FNP )      Anesthesia Quick Evaluation

## 2021-06-17 NOTE — Progress Notes (Signed)
Anesthesia Chart Review:   Case: [id number] Date/Time: 06/21/21 1030   Procedures:      NEPHROLITHOTOMY PERCUTANEOUS (Left) - 90 MINS     HOLMIUM LASER APPLICATION (Left)   Anesthesia type: Choice   Pre-op diagnosis: LEFT RENAL AND URETERAL CALCULI   Location: [facility] PROCEDURE ROOM / [facility]   Surgeons: Franchot Gallo, MD       DISCUSSION: Pt is 80 years old with hx  - CAD (overlapping DES to RCA, DES to CX in 2005) - HTN - asthma - CKD (stage 3) - chronic enteropathy  - thrombocytopenia (this is documented in PCP notes dating back to 2009. I do not see a hematology work-up in Epic, but it may pre-date [place]'s use of Epic. PCP comments several times on low platelet lab results that the result is "stable")  - Hospitalized 6/1-5/22 at [facility] in [place], [place] for LLE cellulitis and sepsis.   Reviewed lab results with Dr. Royce Macadamia    VS: BP 124/66   Pulse 67   Temp 36.8 C (Oral)   Resp 12   Ht 6' (1.829 m)   Wt 85.2 kg   SpO2 99%   BMI 25.48 kg/m   PROVIDERS: - PCP is Plotnikov, Evie Lacks, MD - Cardiologist is Sherren Mocha, MD. Last office visit 06/07/21 - Nephrologist is Corliss Parish, MD - Seeing ID Alcide Evener, MD for cellulitis - GI is Lucio Edward, MD  LABS:  - Cr 2.32. this is consistent with prior results over last year - Platelets 90. Platelet counts range as high as 162 to as low at 69 over the last 10 years. Average platelet count appears to be ~ 90-100  (all labs ordered are listed, but only abnormal results are displayed)  Labs Reviewed  BASIC METABOLIC PANEL - Abnormal; Notable for the following components:      Result Value   Glucose, Bld 118 (*)    Creatinine, Ser 2.32 (*)    GFR, Estimated 28 (*)    All other components within normal limits  CBC - Abnormal; Notable for the following components:   RBC 4.03 (*)    Hemoglobin 12.1 (*)    HCT 38.3 (*)    RDW 15.8 (*)    Platelets 90 (*)    All other  components within normal limits    EKG 08/19/20: sinus bradycardia, HR 57, normal axis, inferior Q waves, T wave inversions 2, 3, aVF, QTC 391, no change since prior tracing dated 02/13/2020   CV: Echo 04/01/21:  1. Mild to moderate LVH, normal LV systolic function. LV compliance is reduced. EF 55% 2. Mild MR 3. Mild TR, mild elevation in R heart pressure  Cardiac cath 01/31/20:  Widely patent left main LAD is relatively small but does reach the left ventricular apex.  Moderate diffuse disease is noted proximally, there is eccentric 50% mid narrowing before a large septal perforator after an acute bend in the LAD before the origin of the last diagonal there is eccentric 75% stenosis in the LAD.  Distal LAD is less than 2 mm in diameter. The circumflex system is codominant with RCA.  The circumflex gives origin to have obtuse marginal branches.  The third obtuse marginal contains ostial 60% narrowing.  The proximal circumflex beyond a 90 degree bend contains a previously placed stent that has diffuse in-stent restenosis up to 50%.  No high-grade obstruction is noted. RCA is a codominant vessel is overlapping Cypher stent from proximal to  mid.  The entire stented segment contains diffuse in-stent restenosis up to 50%.  No high-grade focal stenosis is seen.  The nonstented RCA contains moderate diffuse disease. The EDP is 19 mmHg.  Left ventriculography was not performed because of kidney impairment.    Past Medical History:  Diagnosis Date   Allergy    Anxiety    Arthritis    Asthma    Benign neoplasm of colon    Calculus of gallbladder without mention of cholecystitis    Cataract    bilateral cateracts removed   Celiac disease    Cholelithiasis    Chronic kidney disease    Coronary atherosclerosis of unspecified type of vessel, native or graft    Dermatitis 06/08/2021   Diarrhea    Esophageal reflux    Glaucoma    Gout, unspecified    Hyperparathyroidism    Long term (current) use  of anticoagulants    Loss of weight    Lower extremity weakness 05/31/2021   Macular degeneration    Old myocardial infarction    Osteoporosis, unspecified    Other malaise and fatigue    Other specified cardiac dysrhythmias(427.89)    Other testicular hypofunction    Pancreatic insufficiency 05/31/2021   Personal history of colonic polyps    Pure hypercholesterolemia    Thrombocytopenia (HCC)    chronic, per PCP notes dating back to 2009   Tinea pedis 05/31/2021   Unspecified asthma(493.90)    Unspecified essential hypertension    Unspecified hypothyroidism     Past Surgical History:  Procedure Laterality Date   cataract surgery Bilateral    COLONOSCOPY  06/26/2017   [facility]   COLONOSCOPY WITH PROPOFOL N/A 11/27/2014   Procedure: COLONOSCOPY WITH PROPOFOL;  Surgeon: Milus Banister, MD;  Location: [facility];  Service: Endoscopy;  Laterality: N/A;   CORONARY ANGIOPLASTY     CORONARY STENT PLACEMENT  2011   Cypher; in distal circumflex artery   CYSTOSCOPY  1998   EYE SURGERY     KNEE SURGERY     right   LEFT HEART CATH AND CORONARY ANGIOGRAPHY N/A 01/31/2020   Procedure: LEFT HEART CATH AND CORONARY ANGIOGRAPHY;  Surgeon: Belva Crome, MD;  Location: [facility];  Service: Cardiovascular;  Laterality: N/A;   SHOULDER SURGERY Left 9/14   Dr Shara Blazing   UPPER GASTROINTESTINAL ENDOSCOPY     VASCULAR SURGERY      MEDICATIONS:  Ascorbic Acid (VITAMIN C PO)   aspirin 81 MG tablet   B Complex Vitamins (B COMPLEX PO)   calcitRIOL (ROCALTROL) 0.25 MCG capsule   carvedilol (COREG) 25 MG tablet   cetirizine (ZYRTEC) 10 MG tablet   Cholecalciferol 2000 UNITS TABS   clonazePAM (KLONOPIN) 0.25 MG disintegrating tablet   diclofenac sodium (VOLTAREN) 1 % GEL   hydrALAZINE (APRESOLINE) 10 MG tablet   isosorbide mononitrate (IMDUR) 30 MG 24 hr tablet   levothyroxine (SYNTHROID) 175 MCG tablet   lipase/protease/amylase (CREON) 36000 UNITS CPEP capsule   Multiple Vitamin  (MULTIVITAMIN) capsule   Multiple Vitamins-Minerals (PRESERVISION AREDS 2) CAPS   nitroGLYCERIN (NITROSTAT) 0.4 MG SL tablet   olmesartan (BENICAR) 5 MG tablet   pantoprazole (PROTONIX) 40 MG tablet   Propylene Glycol (SYSTANE BALANCE) 0.6 % SOLN   saccharomyces boulardii (FLORASTOR) 250 MG capsule   simvastatin (ZOCOR) 10 MG tablet   SYRINGE-NEEDLE, DISP, 3 ML (BD ECLIPSE SYRINGE) 23G X 1" 3 ML MISC   tamsulosin (FLOMAX) 0.4 MG CAPS capsule   testosterone cypionate (  DEPOTESTOSTERONE CYPIONATE) 200 MG/ML injection   TURMERIC PO   UNABLE TO FIND   No current facility-administered medications for this encounter.    If no changes, I anticipate pt can proceed with surgery as scheduled.   Willeen Cass, PhD, FNP-BC [facility] Short Stay Surgical Center/Anesthesiology Phone: [phone] 06/17/2021 10:31 AM

## 2021-06-18 ENCOUNTER — Other Ambulatory Visit: Payer: Self-pay

## 2021-06-18 ENCOUNTER — Encounter: Payer: Self-pay | Admitting: Infectious Disease

## 2021-06-18 ENCOUNTER — Ambulatory Visit (INDEPENDENT_AMBULATORY_CARE_PROVIDER_SITE_OTHER): Payer: PPO | Admitting: Infectious Disease

## 2021-06-18 ENCOUNTER — Other Ambulatory Visit: Payer: Self-pay | Admitting: Student

## 2021-06-18 VITALS — BP 112/70 | HR 65 | Temp 97.8°F | Ht 72.0 in | Wt 188.0 lb

## 2021-06-18 DIAGNOSIS — L03119 Cellulitis of unspecified part of limb: Secondary | ICD-10-CM | POA: Diagnosis not present

## 2021-06-18 DIAGNOSIS — L270 Generalized skin eruption due to drugs and medicaments taken internally: Secondary | ICD-10-CM | POA: Diagnosis not present

## 2021-06-18 DIAGNOSIS — R29898 Other symptoms and signs involving the musculoskeletal system: Secondary | ICD-10-CM | POA: Diagnosis not present

## 2021-06-18 MED ORDER — AMOXICILLIN 500 MG PO CAPS: 500.0000 mg | ORAL_CAPSULE | Freq: Two times a day (BID) | ORAL | 4 refills | Status: DC

## 2021-06-18 NOTE — Progress Notes (Signed)
Subjective:   Chief complaint: Complaint of an area on his leg where he says he struck it against an object.  It appears similar to the area he complained about last time when I was looking at his leg.      Patient ID: Scott Oneal, male    DOB: [birth year], 80 y.o.   MRN: [id number]  Scott Oneal is an 80 year old Caucasian man with multiple medical problems including chronic diarrhea that sounds as if it is due to pancreatic insufficiency as he says it is improved now that he began pancreatic enzyme replacement.  He also has coronary artery disease and a history of cholelithiasis and also a lifelong history of kidney stones.  Scott Oneal told me that he first developed problems with his left lower extremity this June.  He had been working outside extensively in the yard and came back in and noticed erythema and swelling of his leg.  He lay back in recliner and fell asleep that evening.  The next day when he attempted to get out of bed he was too weak to get out of the recliner.  He was ultimately hospitalized at [facility] and stayed there for 4 days.  I did not have access to his medical records but he shows me a photograph of one of the antibiotics he was given as an inpatient which was in fact vancomycin.  He was ultimately discharged on doxycycline and Augmentin and saw his primary care physician Dr. Alain Marion on April 05, 2021.  According to Dr. Alain Marion the patient had relapse of his left lower extremity redness with pain and swelling.  [place]  [name] tried to acquire Elesa Hacker for the patient but it was cost-prohibitive.   Doxycycline was given in the interim.  He was referred to [place] in infectious disease.  He told me that he did not have much in the improvement in the way of his erythema while on the doxycycline.  He has now developed a rash all over his forearms and his face that is highly consistent with a sun exposure rash on  doxycycline at his visit is quite painful and quite erythematous in the sun exposed regions.  His diarrhea proved while he has been on his pancreatic replacement enzymes.  In questioning him he also mentions history of his left leg giving out from underneath him at times.  He has had an MRI of the lumbar spine this spring but not since he began having the symptoms of his leg giving out from underneath him as he describes them to me today.  His left lower extremity did haves ome erythema and is quite tender in some of the areas around this.  He did not show evidence of systemic infection with fevers chills nausea vomiting or anorexia.  Several visits ago when I saw him he had an obvious doxycycline sun exposure rash.  I had him stop the doxycycline.  I ordered plain films of his leg which I personally reviewed show no evidence of osteomyelitis.  Films of the spine are also ordered and is reviewed personally and they show degenerative disc disease and facet hypertrophy in the lumbar spine with bilateral kidney stones.  Was able to review records from the hospital in [place] where he was hospitalized he was indeed discharged on Augmentin after having been on vancomycin.  I did not see blood culture data when I look through the records that were faxed to's  to [place] in talking to him today does not sound like blood cultures were drawn.  After his first visit he was initially scheduled to to see me in several weeks time but was concerned over the weekend prior to the last visit when there was an area on the posterior of his leg that became more erythematous.  He is still bothered also on some about some areas on the anterior the legs that are still erythematous.  I noted these areas do not blanch and I suspect they may be potential damaged minor blood vessels.  In the interim I ordered an MRI of his leg which came back and was personally reviewed by me and does not show any evidence of  osteomyelitis or pyomyositis.  He also had a history of his leg giving out from under him and I ordered a repeat MRI of his lumbar spine.  I have also personally reviewed them and agree that there is no evidence of an epidural abscess and there is still L4-L5 spinal canal stenosis which is no change as well as some severe neural neural foraminal stenosis on the right L3-4 and L4-5 L5-S1  He return to clinic again roughly a week since last last saw him and he has not had any worsening of his erythema in his leg he has an area that he believes is new where he had struck his leg against an object.  He still has tenderness throughout his left leg and has bilateral edema.       Past Medical History:  Diagnosis Date   Allergy    Anxiety    Arthritis    Asthma    Benign neoplasm of colon    Calculus of gallbladder without mention of cholecystitis    Cataract    bilateral cateracts removed   Celiac disease    Cholelithiasis    Chronic kidney disease    Coronary atherosclerosis of unspecified type of vessel, native or graft    Dermatitis 06/08/2021   Diarrhea    Esophageal reflux    Glaucoma    Gout, unspecified    Hyperparathyroidism    Long term (current) use of anticoagulants    Loss of weight    Lower extremity weakness 05/31/2021   Macular degeneration    Old myocardial infarction    Osteoporosis, unspecified    Other malaise and fatigue    Other specified cardiac dysrhythmias(427.89)    Other testicular hypofunction    Pancreatic insufficiency 05/31/2021   Personal history of colonic polyps    Pure hypercholesterolemia    Thrombocytopenia (HCC)    chronic, per PCP notes dating back to 2009   Tinea pedis 05/31/2021   Unspecified asthma(493.90)    Unspecified essential hypertension    Unspecified hypothyroidism     Past Surgical History:  Procedure Laterality Date   cataract surgery Bilateral    COLONOSCOPY  06/26/2017   [facility]   COLONOSCOPY WITH PROPOFOL N/A  11/27/2014   Procedure: COLONOSCOPY WITH PROPOFOL;  Surgeon: Milus Banister, MD;  Location: [facility];  Service: Endoscopy;  Laterality: N/A;   CORONARY ANGIOPLASTY     CORONARY STENT PLACEMENT  2011   Cypher; in distal circumflex artery   CYSTOSCOPY  1998   EYE SURGERY     KNEE SURGERY     right   LEFT HEART CATH AND CORONARY ANGIOGRAPHY N/A 01/31/2020   Procedure: LEFT HEART CATH AND CORONARY ANGIOGRAPHY;  Surgeon: Belva Crome, MD;  Location: [facility];  Service: Cardiovascular;  Laterality: N/A;   SHOULDER SURGERY Left 9/14   Dr Shara Blazing   UPPER GASTROINTESTINAL ENDOSCOPY     VASCULAR SURGERY      Family History  Problem Relation Age of Onset   Lymphoma Father    Hypertension Other    Vision loss Maternal Grandmother    Colon cancer Son 43   Asthma Neg Hx    Esophageal cancer Neg Hx    Rectal cancer Neg Hx    Stomach cancer Neg Hx    Colon polyps Neg Hx       Social History   Socioeconomic History   Marital status: Married    Spouse name: Demetrion Wesby   Number of children: 3   Years of education: Not on file   Highest education level: Not on file  Occupational History   Occupation: retired    Fish farm manager: RETIRED    Comment: Engineering  Tobacco Use   Smoking status: Never   Smokeless tobacco: Never  Vaping Use   Vaping Use: Never used  Substance and Sexual Activity   Alcohol use: No   Drug use: No   Sexual activity: Yes  Other Topics Concern   Not on file  Social History Narrative   Retired.   Regular Exercise-Yes; yoga & silver sneakers   Daily Caffeine Use.            Social Determinants of Health   Financial Resource Strain: Low Risk    Difficulty of Paying Living Expenses: Not hard at all  Food Insecurity: No Food Insecurity   Worried About Charity fundraiser in the Last Year: Never true   [place] in the Last Year: Never true  Transportation Needs: No Transportation Needs   Lack of Transportation (Medical): No   Lack of  Transportation (Non-Medical): No  Physical Activity: Sufficiently Active   Days of Exercise per Week: 5 days   Minutes of Exercise per Session: 30 min  Stress: No Stress Concern Present   Feeling of Stress : Not at all  Social Connections: Socially Integrated   Frequency of Communication with Friends and Family: More than three times a week   Frequency of Social Gatherings with Friends and Family: More than three times a week   Attends Religious Services: More than 4 times per year   Active Member of [employer] or Organizations: Yes   Attends Music therapist: More than 4 times per year   Marital Status: Married    Allergies  Allergen Reactions   Biaxin [Clarithromycin]     Dizziness   Tizanidine     Felt funny   Allantoin-Pramoxine Other (See Comments)    Pt does't remember reaction   Amlodipine     Edema    Benzalkonium Chloride     Other reaction(s): mild rash/itching   Doxycycline Photosensitivity    rash   Metoprolol Tartrate Other (See Comments)    REACTION: fatigue     Current Outpatient Medications:    Ascorbic Acid (VITAMIN C PO), Take 1 tablet by mouth every other day., Disp: , Rfl:    aspirin 81 MG tablet, Take 81 mg by mouth every evening. , Disp: , Rfl:    B Complex Vitamins (B COMPLEX PO), Take 1 tablet by mouth 3 (three) times daily., Disp: , Rfl:    calcitRIOL (ROCALTROL) 0.25 MCG capsule, TAKE 1 CAPSULE BY MOUTH EVERY DAY, Disp: 90 capsule, Rfl: 1   carvedilol (COREG) 25 MG tablet, TAKE  1 TABLET BY MOUTH TWICE A DAY WITH MEALS, Disp: 180 tablet, Rfl: 3   cetirizine (ZYRTEC) 10 MG tablet, Take 10 mg by mouth daily as needed for allergies., Disp: , Rfl:    Cholecalciferol 2000 UNITS TABS, Take 2,000 Units by mouth daily., Disp: , Rfl:    clonazePAM (KLONOPIN) 0.25 MG disintegrating tablet, TAKE 1 TABLET (0.25 MG TOTAL) BY MOUTH 2 (TWO) TIMES DAILY AS NEEDED (ANXIETY)., Disp: 60 tablet, Rfl: 3   diclofenac sodium (VOLTAREN) 1 % GEL, APPLY 4 G TOPICALLY  4 TIMES DAILY. (Patient taking differently: Apply 1 application topically 4 (four) times daily as needed (pain).), Disp: 200 g, Rfl: 5   hydrALAZINE (APRESOLINE) 10 MG tablet, Take 1-2 tablet 3 times a day as needed if blood pressure is >170, Disp: 60 tablet, Rfl: 3   isosorbide mononitrate (IMDUR) 30 MG 24 hr tablet, TAKE 1 TABLET BY MOUTH EVERY DAY, Disp: 90 tablet, Rfl: 2   levothyroxine (SYNTHROID) 175 MCG tablet, Take 1 tablet (175 mcg total) by mouth daily., Disp: 90 tablet, Rfl: 3   lipase/protease/amylase (CREON) 36000 UNITS CPEP capsule, Take 2 capsules (72,000 Units total) by mouth 3 (three) times daily with meals. May also take 1 capsule (36,000 Units total) as needed (with snacks)., Disp: 240 capsule, Rfl: 11   Multiple Vitamin (MULTIVITAMIN) capsule, Take 1 capsule by mouth daily., Disp: , Rfl:    Multiple Vitamins-Minerals (PRESERVISION AREDS 2) CAPS, Take 1 capsule by mouth in the morning and at bedtime., Disp: , Rfl:    nitroGLYCERIN (NITROSTAT) 0.4 MG SL tablet, Place 0.4 mg under the tongue every 5 (five) minutes as needed for chest pain., Disp: , Rfl:    olmesartan (BENICAR) 5 MG tablet, TAKE 1 TABLET BY MOUTH EVERY DAY, Disp: 90 tablet, Rfl: 3   pantoprazole (PROTONIX) 40 MG tablet, Take 1 tablet (40 mg total) by mouth daily., Disp: 30 tablet, Rfl: 11   Propylene Glycol (SYSTANE BALANCE) 0.6 % SOLN, Place 1 drop into both eyes daily as needed (Dry eye). systane balance, Disp: , Rfl:    saccharomyces boulardii (FLORASTOR) 250 MG capsule, Take 1 capsule (250 mg total) by mouth 2 (two) times daily., Disp: 60 capsule, Rfl: 1   simvastatin (ZOCOR) 10 MG tablet, TAKE 1 TABLET BY MOUTH EVERYDAY AT BEDTIME, Disp: 90 tablet, Rfl: 3   SYRINGE-NEEDLE, DISP, 3 ML (BD ECLIPSE SYRINGE) 23G X 1" 3 ML MISC, As directed IM, Disp: 50 each, Rfl: 2   tamsulosin (FLOMAX) 0.4 MG CAPS capsule, Take 0.4 mg by mouth daily., Disp: , Rfl:    testosterone cypionate (DEPOTESTOSTERONE CYPIONATE) 200 MG/ML  injection, INJECT 2ML INTO MUSCLE EVERY 2 WEEKS (Patient taking differently: Inject 400 mg into the muscle every 14 (fourteen) days.), Disp: 12 mL, Rfl: 1   TURMERIC PO, Take 1 capsule by mouth 3 (three) times daily., Disp: , Rfl:    UNABLE TO FIND, Med Name: [name] take 2 tablets by mouth daily, Disp: , Rfl:    Review of Systems  Constitutional:  Negative for activity change, appetite change, chills, diaphoresis, fatigue, fever and unexpected weight change.  HENT:  Negative for congestion, rhinorrhea, sinus pressure, sneezing, sore throat and trouble swallowing.   Eyes:  Negative for photophobia and visual disturbance.  Respiratory:  Negative for cough, chest tightness, shortness of breath, wheezing and stridor.   Cardiovascular:  Negative for chest pain, palpitations and leg swelling.  Gastrointestinal:  Negative for abdominal distention, abdominal pain, anal bleeding, blood in stool, constipation, diarrhea,  nausea and vomiting.  Genitourinary:  Negative for difficulty urinating, dysuria, flank pain and hematuria.  Musculoskeletal:  Negative for arthralgias, back pain, gait problem, joint swelling and myalgias.  Skin:  Negative for color change, pallor, rash and wound.  Neurological:  Negative for dizziness, tremors, weakness and light-headedness.  Hematological:  Negative for adenopathy. Does not bruise/bleed easily.  Psychiatric/Behavioral:  Negative for agitation, behavioral problems, confusion, decreased concentration, dysphoric mood and sleep disturbance.       Objective:   Physical Exam Constitutional:      Appearance: He is well-developed.  HENT:     Head: Normocephalic and atraumatic.  Eyes:     Conjunctiva/sclera: Conjunctivae normal.  Cardiovascular:     Rate and Rhythm: Normal rate and regular rhythm.  Pulmonary:     Effort: Pulmonary effort is normal. No respiratory distress.     Breath sounds: No wheezing.  Abdominal:     General: There is no distension.      Palpations: Abdomen is soft.  Musculoskeletal:        General: No tenderness. Normal range of motion.     Cervical back: Normal range of motion and neck supple.     Right lower leg: Edema present.     Left lower leg: Edema present.  Skin:    General: Skin is warm and dry.     Coloration: Skin is not pale.     Findings: Erythema present. No rash.  Neurological:     General: No focal deficit present.     Mental Status: He is alert and oriented to person, place, and time.  Psychiatric:        Mood and Affect: Mood normal.        Behavior: Behavior normal.        Thought Content: Thought content normal.        Judgment: Judgment normal.    Left leg  05/31/2021: he is tender near the area where there was bleeding    Leg 06/08/2021: Continues to be tender to palpation of the skin in this leg.    Left leg 06/18/2021:      Posteriorly     Doxycycline rash has resolved      Assessment & Plan:   History of cellulitis of the left leg:   As mentioned in my prior notes I have no doubt that he had a serious systemic infection with cellulitis as the source.  I have seen him in 3 separate occasions this month alone and observe stability of his leg off antibiotics.  I have ordered an MRI of the leg which is failed to show any evidence of osteomyelitis or pyomyositis  I had previously ordered a sedimentation rate and CRP and they were both normal when I reviewed them again  There is a chance that he could have recurrence of his cellulitis and I have therefore given him a prescription for amoxicillin so he can have a bottle of amoxicillin at home to initiate the first sign of serious infection in his lower extremity as evidenced by severe pain fever chills redness.   Left lower extremity weakness:  I ordered MRI to be repeated this month and I have personally reviewed this as well as his prior MRI of the lumbar sacral spine.  It does not show evidence of an epidural abscess  abscess or discitis which is good thing.  Does continue to show fairly severe neural foraminal stenosis on the right at L3-L4 L4-5 and L5-S1 as well as  unchanged mild L4-L5 spinal canal stenosis.  Would defer further work-up to his primary care and neurology.  Doxycycline induced rash resolved:

## 2021-06-21 ENCOUNTER — Ambulatory Visit (HOSPITAL_COMMUNITY): Payer: PPO | Admitting: Anesthesiology

## 2021-06-21 ENCOUNTER — Encounter (HOSPITAL_COMMUNITY): Payer: Self-pay

## 2021-06-21 ENCOUNTER — Observation Stay (HOSPITAL_COMMUNITY)
Admission: RE | Admit: 2021-06-21 | Discharge: 2021-06-22 | Disposition: A | Discharge status: Home / Self Care | Payer: PPO | Attending: Urology | Admitting: Urology

## 2021-06-21 ENCOUNTER — Ambulatory Visit (HOSPITAL_COMMUNITY)
Admission: RE | Admit: 2021-06-21 | Discharge: 2021-06-21 | Disposition: A | Discharge status: Home / Self Care | Payer: PPO | Source: Ambulatory Visit | Attending: Urology | Admitting: Urology

## 2021-06-21 ENCOUNTER — Ambulatory Visit (HOSPITAL_COMMUNITY): Payer: PPO | Admitting: Emergency Medicine

## 2021-06-21 ENCOUNTER — Encounter (HOSPITAL_COMMUNITY)
Admission: RE | Disposition: A | Discharge status: Home / Self Care | Payer: Self-pay | Source: Home / Self Care | Attending: Urology

## 2021-06-21 ENCOUNTER — Ambulatory Visit (HOSPITAL_COMMUNITY): Payer: PPO

## 2021-06-21 ENCOUNTER — Other Ambulatory Visit: Payer: Self-pay

## 2021-06-21 ENCOUNTER — Encounter (HOSPITAL_COMMUNITY): Payer: Self-pay | Admitting: Urology

## 2021-06-21 DIAGNOSIS — J45909 Unspecified asthma, uncomplicated: Secondary | ICD-10-CM | POA: Insufficient documentation

## 2021-06-21 DIAGNOSIS — N189 Chronic kidney disease, unspecified: Secondary | ICD-10-CM | POA: Diagnosis not present

## 2021-06-21 DIAGNOSIS — I2510 Atherosclerotic heart disease of native coronary artery without angina pectoris: Secondary | ICD-10-CM | POA: Diagnosis not present

## 2021-06-21 DIAGNOSIS — N201 Calculus of ureter: Secondary | ICD-10-CM

## 2021-06-21 DIAGNOSIS — Z79899 Other long term (current) drug therapy: Secondary | ICD-10-CM | POA: Diagnosis not present

## 2021-06-21 DIAGNOSIS — Z7982 Long term (current) use of aspirin: Secondary | ICD-10-CM | POA: Diagnosis not present

## 2021-06-21 DIAGNOSIS — I252 Old myocardial infarction: Secondary | ICD-10-CM | POA: Diagnosis not present

## 2021-06-21 DIAGNOSIS — N200 Calculus of kidney: Principal | ICD-10-CM

## 2021-06-21 DIAGNOSIS — N183 Chronic kidney disease, stage 3 unspecified: Secondary | ICD-10-CM

## 2021-06-21 DIAGNOSIS — J4540 Moderate persistent asthma, uncomplicated: Secondary | ICD-10-CM | POA: Diagnosis not present

## 2021-06-21 DIAGNOSIS — N202 Calculus of kidney with calculus of ureter: Secondary | ICD-10-CM | POA: Diagnosis not present

## 2021-06-21 DIAGNOSIS — E039 Hypothyroidism, unspecified: Secondary | ICD-10-CM | POA: Insufficient documentation

## 2021-06-21 DIAGNOSIS — E860 Dehydration: Secondary | ICD-10-CM | POA: Diagnosis not present

## 2021-06-21 DIAGNOSIS — Z7901 Long term (current) use of anticoagulants: Secondary | ICD-10-CM | POA: Diagnosis not present

## 2021-06-21 DIAGNOSIS — Z955 Presence of coronary angioplasty implant and graft: Secondary | ICD-10-CM | POA: Insufficient documentation

## 2021-06-21 DIAGNOSIS — I129 Hypertensive chronic kidney disease with stage 1 through stage 4 chronic kidney disease, or unspecified chronic kidney disease: Secondary | ICD-10-CM | POA: Diagnosis not present

## 2021-06-21 DIAGNOSIS — E7800 Pure hypercholesterolemia, unspecified: Secondary | ICD-10-CM | POA: Diagnosis not present

## 2021-06-21 HISTORY — PX: NEPHROLITHOTOMY: SHX5134

## 2021-06-21 HISTORY — PX: IR URETERAL STENT LEFT NEW ACCESS W/O SEP NEPHROSTOMY CATH: IMG6075

## 2021-06-21 LAB — APTT: aPTT: 29 seconds (ref 24–36)

## 2021-06-21 LAB — SURGICAL PCR SCREEN
MRSA, PCR: NEGATIVE
Staphylococcus aureus: NEGATIVE

## 2021-06-21 LAB — CBC
HCT: 37.5 % — ABNORMAL LOW (ref 39.0–52.0)
Hemoglobin: 11.8 g/dL — ABNORMAL LOW (ref 13.0–17.0)
MCH: 30.1 pg (ref 26.0–34.0)
MCHC: 31.5 g/dL (ref 30.0–36.0)
MCV: 95.7 fL (ref 80.0–100.0)
Platelets: 88 10*3/uL — ABNORMAL LOW (ref 150–400)
RBC: 3.92 MIL/uL — ABNORMAL LOW (ref 4.22–5.81)
RDW: 15.9 % — ABNORMAL HIGH (ref 11.5–15.5)
WBC: 4.6 10*3/uL (ref 4.0–10.5)
nRBC: 0 % (ref 0.0–0.2)

## 2021-06-21 LAB — PROTIME-INR
INR: 1.1 (ref 0.8–1.2)
Prothrombin Time: 14.1 seconds (ref 11.4–15.2)

## 2021-06-21 SURGERY — NEPHROLITHOTOMY PERCUTANEOUS
Anesthesia: General | Laterality: Left

## 2021-06-21 MED ORDER — CLONAZEPAM 0.125 MG PO TBDP: 0.2500 mg | ORAL_TABLET | Freq: Two times a day (BID) | ORAL | Status: DC | PRN

## 2021-06-21 MED ORDER — ONDANSETRON HCL 4 MG/2ML IJ SOLN
INTRAMUSCULAR | Status: DC | PRN
  Administered 2021-06-21: 4 mg via INTRAVENOUS

## 2021-06-21 MED ORDER — CIPROFLOXACIN IN D5W 400 MG/200ML IV SOLN
400.0000 mg | INTRAVENOUS | Status: AC
  Administered 2021-06-21: 400 mg via INTRAVENOUS
  Filled 2021-06-21: qty 200

## 2021-06-21 MED ORDER — CHLORHEXIDINE GLUCONATE 0.12 % MT SOLN
15.0000 mL | Freq: Once | OROMUCOSAL | Status: AC
  Administered 2021-06-21: 15 mL via OROMUCOSAL

## 2021-06-21 MED ORDER — LIDOCAINE HCL 1 % IJ SOLN
INTRAMUSCULAR | Status: AC
  Filled 2021-06-21: qty 20

## 2021-06-21 MED ORDER — LIDOCAINE-EPINEPHRINE 1 %-1:100000 IJ SOLN
INTRAMUSCULAR | Status: AC | PRN
  Administered 2021-06-21: 10 mL

## 2021-06-21 MED ORDER — FENTANYL CITRATE (PF) 100 MCG/2ML IJ SOLN
INTRAMUSCULAR | Status: AC
  Filled 2021-06-21: qty 2

## 2021-06-21 MED ORDER — PHENYLEPHRINE 40 MCG/ML (10ML) SYRINGE FOR IV PUSH (FOR BLOOD PRESSURE SUPPORT)
PREFILLED_SYRINGE | INTRAVENOUS | Status: DC | PRN
  Administered 2021-06-21 (×5): 80 ug via INTRAVENOUS

## 2021-06-21 MED ORDER — MIDAZOLAM HCL 2 MG/2ML IJ SOLN
INTRAMUSCULAR | Status: AC | PRN
  Administered 2021-06-21 (×2): 1 mg via INTRAVENOUS

## 2021-06-21 MED ORDER — SENNA 8.6 MG PO TABS
1.0000 | ORAL_TABLET | Freq: Two times a day (BID) | ORAL | Status: DC
  Administered 2021-06-22: 8.6 mg via ORAL
  Filled 2021-06-21: qty 1

## 2021-06-21 MED ORDER — SODIUM CHLORIDE 0.9 % IV SOLN
INTRAVENOUS | Status: AC
  Filled 2021-06-21: qty 20

## 2021-06-21 MED ORDER — MUPIROCIN 2 % EX OINT
1.0000 "application " | TOPICAL_OINTMENT | Freq: Two times a day (BID) | CUTANEOUS | Status: DC
  Administered 2021-06-21 – 2021-06-22 (×2): 1 via NASAL
  Filled 2021-06-21 (×2): qty 22

## 2021-06-21 MED ORDER — MIDAZOLAM HCL 2 MG/2ML IJ SOLN
INTRAMUSCULAR | Status: AC
  Filled 2021-06-21: qty 4

## 2021-06-21 MED ORDER — OXYCODONE HCL 5 MG PO TABS
5.0000 mg | ORAL_TABLET | ORAL | Status: DC | PRN
  Administered 2021-06-21 (×2): 5 mg via ORAL
  Filled 2021-06-21 (×2): qty 1

## 2021-06-21 MED ORDER — PROMETHAZINE HCL 25 MG/ML IJ SOLN: 6.2500 mg | INTRAMUSCULAR | Status: DC | PRN

## 2021-06-21 MED ORDER — TAMSULOSIN HCL 0.4 MG PO CAPS
0.4000 mg | ORAL_CAPSULE | Freq: Every day | ORAL | Status: DC
  Administered 2021-06-21 – 2021-06-22 (×2): 0.4 mg via ORAL
  Filled 2021-06-21 (×2): qty 1

## 2021-06-21 MED ORDER — LACTATED RINGERS IV SOLN: INTRAVENOUS | Status: DC

## 2021-06-21 MED ORDER — ROCURONIUM BROMIDE 10 MG/ML (PF) SYRINGE
PREFILLED_SYRINGE | INTRAVENOUS | Status: DC | PRN
  Administered 2021-06-21: 70 mg via INTRAVENOUS

## 2021-06-21 MED ORDER — STERILE WATER FOR IRRIGATION IR SOLN
Status: DC | PRN
  Administered 2021-06-21: 500 mL

## 2021-06-21 MED ORDER — IRBESARTAN 75 MG PO TABS
37.5000 mg | ORAL_TABLET | Freq: Every day | ORAL | Status: DC
  Administered 2021-06-22: 37.5 mg via ORAL
  Filled 2021-06-21: qty 0.5

## 2021-06-21 MED ORDER — SODIUM CHLORIDE 0.45 % IV SOLN: INTRAVENOUS | Status: DC

## 2021-06-21 MED ORDER — PROPOFOL 10 MG/ML IV BOLUS
INTRAVENOUS | Status: DC | PRN
  Administered 2021-06-21: 150 mg via INTRAVENOUS

## 2021-06-21 MED ORDER — FENTANYL CITRATE (PF) 100 MCG/2ML IJ SOLN: 25.0000 ug | INTRAMUSCULAR | Status: DC | PRN

## 2021-06-21 MED ORDER — LIDOCAINE 2% (20 MG/ML) 5 ML SYRINGE
INTRAMUSCULAR | Status: DC | PRN
  Administered 2021-06-21: 80 mg via INTRAVENOUS

## 2021-06-21 MED ORDER — SIMVASTATIN 20 MG PO TABS: 20.0000 mg | ORAL_TABLET | Freq: Every day | ORAL | Status: DC

## 2021-06-21 MED ORDER — NITROGLYCERIN 0.4 MG SL SUBL: 0.4000 mg | SUBLINGUAL_TABLET | SUBLINGUAL | Status: DC | PRN

## 2021-06-21 MED ORDER — EPHEDRINE 5 MG/ML INJ
INTRAVENOUS | Status: AC
  Filled 2021-06-21: qty 5

## 2021-06-21 MED ORDER — PANCRELIPASE (LIP-PROT-AMYL) 12000-38000 UNITS PO CPEP
36000.0000 [IU] | ORAL_CAPSULE | Freq: Three times a day (TID) | ORAL | Status: DC
  Administered 2021-06-21 – 2021-06-22 (×2): 36000 [IU] via ORAL
  Filled 2021-06-21 (×2): qty 3

## 2021-06-21 MED ORDER — CARVEDILOL 25 MG PO TABS
25.0000 mg | ORAL_TABLET | Freq: Two times a day (BID) | ORAL | Status: DC
  Administered 2021-06-21 – 2021-06-22 (×2): 25 mg via ORAL
  Filled 2021-06-21 (×2): qty 1

## 2021-06-21 MED ORDER — FENTANYL CITRATE (PF) 100 MCG/2ML IJ SOLN
INTRAMUSCULAR | Status: DC | PRN
  Administered 2021-06-21: 100 ug via INTRAVENOUS

## 2021-06-21 MED ORDER — 0.9 % SODIUM CHLORIDE (POUR BTL) OPTIME
TOPICAL | Status: DC | PRN
  Administered 2021-06-21: 1000 mL

## 2021-06-21 MED ORDER — EPHEDRINE SULFATE-NACL 50-0.9 MG/10ML-% IV SOSY
PREFILLED_SYRINGE | INTRAVENOUS | Status: DC | PRN
  Administered 2021-06-21: 10 mg via INTRAVENOUS
  Administered 2021-06-21: 15 mg via INTRAVENOUS

## 2021-06-21 MED ORDER — CEFTRIAXONE SODIUM 2 G IJ SOLR
2.0000 g | Freq: Once | INTRAMUSCULAR | Status: AC
  Administered 2021-06-21: 2 g via INTRAVENOUS

## 2021-06-21 MED ORDER — ISOSORBIDE MONONITRATE ER 30 MG PO TB24
30.0000 mg | ORAL_TABLET | Freq: Every day | ORAL | Status: DC
  Administered 2021-06-22: 30 mg via ORAL
  Filled 2021-06-21: qty 1

## 2021-06-21 MED ORDER — FENTANYL CITRATE (PF) 100 MCG/2ML IJ SOLN
INTRAMUSCULAR | Status: AC | PRN
  Administered 2021-06-21 (×2): 50 ug via INTRAVENOUS

## 2021-06-21 MED ORDER — OXYCODONE HCL 5 MG PO TABS: 5.0000 mg | ORAL_TABLET | Freq: Four times a day (QID) | ORAL | 0 refills | Status: DC | PRN

## 2021-06-21 MED ORDER — ONDANSETRON HCL 4 MG/2ML IJ SOLN: 4.0000 mg | INTRAMUSCULAR | Status: DC | PRN

## 2021-06-21 MED ORDER — DEXAMETHASONE SODIUM PHOSPHATE 10 MG/ML IJ SOLN
INTRAMUSCULAR | Status: DC | PRN
  Administered 2021-06-21: 6 mg via INTRAVENOUS

## 2021-06-21 MED ORDER — PHENYLEPHRINE 40 MCG/ML (10ML) SYRINGE FOR IV PUSH (FOR BLOOD PRESSURE SUPPORT)
PREFILLED_SYRINGE | INTRAVENOUS | Status: AC
  Filled 2021-06-21: qty 10

## 2021-06-21 MED ORDER — SUGAMMADEX SODIUM 200 MG/2ML IV SOLN
INTRAVENOUS | Status: DC | PRN
  Administered 2021-06-21: 200 mg via INTRAVENOUS

## 2021-06-21 MED ORDER — LEVOTHYROXINE SODIUM 75 MCG PO TABS
175.0000 ug | ORAL_TABLET | Freq: Every day | ORAL | Status: DC
  Administered 2021-06-22: 175 ug via ORAL
  Filled 2021-06-21: qty 1

## 2021-06-21 MED ORDER — HYDROMORPHONE HCL 1 MG/ML IJ SOLN
0.5000 mg | INTRAMUSCULAR | Status: DC | PRN
  Administered 2021-06-21: 1 mg via INTRAVENOUS
  Filled 2021-06-21: qty 1

## 2021-06-21 MED ORDER — SODIUM CHLORIDE 0.9 % IR SOLN
Status: DC | PRN
  Administered 2021-06-21: 9000 mL

## 2021-06-21 MED ORDER — IOHEXOL 300 MG/ML  SOLN
INTRAMUSCULAR | Status: DC | PRN
  Administered 2021-06-21: 30 mL

## 2021-06-21 MED ORDER — PANTOPRAZOLE SODIUM 40 MG PO TBEC
40.0000 mg | DELAYED_RELEASE_TABLET | Freq: Every day | ORAL | Status: DC
  Administered 2021-06-22: 40 mg via ORAL
  Filled 2021-06-21: qty 1

## 2021-06-21 MED ORDER — SODIUM CHLORIDE 0.9 % IV SOLN: INTRAVENOUS | Status: DC

## 2021-06-21 MED ORDER — SULFAMETHOXAZOLE-TRIMETHOPRIM 800-160 MG PO TABS
1.0000 | ORAL_TABLET | Freq: Two times a day (BID) | ORAL | Status: DC
  Administered 2021-06-21 – 2021-06-22 (×2): 1 via ORAL
  Filled 2021-06-21 (×2): qty 1

## 2021-06-21 MED ORDER — LORATADINE 10 MG PO TABS
10.0000 mg | ORAL_TABLET | Freq: Every day | ORAL | Status: DC
  Administered 2021-06-22: 10 mg via ORAL
  Filled 2021-06-21: qty 1

## 2021-06-21 MED ORDER — ACETAMINOPHEN 500 MG PO TABS
1000.0000 mg | ORAL_TABLET | Freq: Once | ORAL | Status: AC
  Administered 2021-06-21: 1000 mg via ORAL
  Filled 2021-06-21: qty 2

## 2021-06-21 MED ORDER — ORAL CARE MOUTH RINSE: 15.0000 mL | Freq: Once | OROMUCOSAL | Status: AC

## 2021-06-21 MED ORDER — IOHEXOL 300 MG/ML  SOLN
25.0000 mL | Freq: Once | INTRAMUSCULAR | Status: AC | PRN
  Administered 2021-06-21: 10 mL

## 2021-06-21 MED ORDER — CHLORHEXIDINE GLUCONATE CLOTH 2 % EX PADS
6.0000 | MEDICATED_PAD | Freq: Every day | CUTANEOUS | Status: DC
  Administered 2021-06-22: 6 via TOPICAL

## 2021-06-21 SURGICAL SUPPLY — 56 items
APL PRP STRL LF DISP 70% ISPRP (MISCELLANEOUS) ×1
APL SKNCLS STERI-STRIP NONHPOA (GAUZE/BANDAGES/DRESSINGS) ×2
BAG COUNTER SPONGE SURGICOUNT (BAG) IMPLANT
BAG DRN RND TRDRP ANRFLXCHMBR (UROLOGICAL SUPPLIES)
BAG SPNG CNTER NS LX DISP (BAG)
BAG URINE DRAIN 2000ML AR STRL (UROLOGICAL SUPPLIES) IMPLANT
BASKET ZERO TIP NITINOL 2.4FR (BASKET) ×2 IMPLANT
BENZOIN TINCTURE PRP APPL 2/3 (GAUZE/BANDAGES/DRESSINGS) ×4 IMPLANT
BLADE SURG 15 STRL LF DISP TIS (BLADE) ×1 IMPLANT
BLADE SURG 15 STRL SS (BLADE) ×2
BSKT STON RTRVL ZERO TP 2.4FR (BASKET) ×1
CATH FOLEY 2W COUNCIL 20FR 5CC (CATHETERS) IMPLANT
CATH FOLEY 2W COUNCIL 5CC 16FR (CATHETERS) ×1 IMPLANT
CATH INTERMIT  6FR 70CM (CATHETERS) ×1 IMPLANT
CATH ROBINSON RED A/P 20FR (CATHETERS) IMPLANT
CATH X-FORCE N30 NEPHROSTOMY (TUBING) ×2 IMPLANT
CHLORAPREP W/TINT 26 (MISCELLANEOUS) ×2 IMPLANT
COVER SURGICAL LIGHT HANDLE (MISCELLANEOUS) ×2 IMPLANT
DRAPE C-ARM 42X120 X-RAY (DRAPES) ×2 IMPLANT
DRAPE LINGEMAN PERC (DRAPES) ×2 IMPLANT
DRAPE SURG IRRIG POUCH 19X23 (DRAPES) ×2 IMPLANT
DRSG PAD ABDOMINAL 8X10 ST (GAUZE/BANDAGES/DRESSINGS) ×1 IMPLANT
DRSG TEGADERM 8X12 (GAUZE/BANDAGES/DRESSINGS) ×3 IMPLANT
EXTRACTOR STONE 1.7FRX115CM (UROLOGICAL SUPPLIES) ×2 IMPLANT
GAUZE SPONGE 4X4 12PLY STRL (GAUZE/BANDAGES/DRESSINGS) ×1 IMPLANT
GLOVE SURG ENC TEXT LTX SZ8 (GLOVE) ×2 IMPLANT
GOWN STRL REUS W/TWL XL LVL3 (GOWN DISPOSABLE) ×2 IMPLANT
GUIDEWIRE AMPLAZ .035X145 (WIRE) ×4 IMPLANT
GUIDEWIRE STR DUAL SENSOR (WIRE) ×1 IMPLANT
KIT BASIN OR (CUSTOM PROCEDURE TRAY) ×2 IMPLANT
KIT PROBE 340X3.4XDISP GRN (MISCELLANEOUS) IMPLANT
KIT PROBE TRILOGY 3.4X340 (MISCELLANEOUS)
KIT PROBE TRILOGY 3.9X350 (MISCELLANEOUS) ×1 IMPLANT
KIT TURNOVER KIT A (KITS) ×2 IMPLANT
LASER FIB FLEXIVA PULSE ID 365 (Laser) IMPLANT
LASER FIB FLEXIVA PULSE ID 550 (Laser) IMPLANT
LASER FIB FLEXIVA PULSE ID 910 (Laser) IMPLANT
MANIFOLD NEPTUNE II (INSTRUMENTS) ×2 IMPLANT
NS IRRIG 1000ML POUR BTL (IV SOLUTION) ×2 IMPLANT
PACK CYSTO (CUSTOM PROCEDURE TRAY) ×2 IMPLANT
PENCIL SMOKE EVACUATOR (MISCELLANEOUS) IMPLANT
SHEATH PEELAWAY SET 9 (SHEATH) ×2 IMPLANT
SPONGE T-LAP 4X18 ~~LOC~~+RFID (SPONGE) ×2 IMPLANT
STENT URET 6FRX28 CONTOUR (STENTS) ×1 IMPLANT
SUT SILK 2 0 30  PSL (SUTURE) ×2
SUT SILK 2 0 30 PSL (SUTURE) ×1 IMPLANT
SYR 10ML LL (SYRINGE) ×2 IMPLANT
SYR 20ML LL LF (SYRINGE) ×4 IMPLANT
TRACTIP FLEXIVA PULS ID 200XHI (Laser) IMPLANT
TRACTIP FLEXIVA PULSE ID 200 (Laser)
TRAY FOLEY MTR SLVR 14FR STAT (SET/KITS/TRAYS/PACK) IMPLANT
TRAY FOLEY MTR SLVR 16FR STAT (SET/KITS/TRAYS/PACK) ×2 IMPLANT
TUBING CONNECTING 10 (TUBING) ×4 IMPLANT
TUBING STONE CATCHER TRILOGY (MISCELLANEOUS) IMPLANT
TUBING UROLOGY SET (TUBING) ×2 IMPLANT
WATER STERILE IRR 1000ML POUR (IV SOLUTION) ×2 IMPLANT

## 2021-06-21 NOTE — Anesthesia Postprocedure Evaluation (Signed)
Anesthesia Post Note  Patient: Scott Oneal  Procedure(s) Performed: NEPHROLITHOTOMY PERCUTANEOUS (Left)     Patient location during evaluation: PACU Anesthesia Type: General Level of consciousness: sedated Pain management: pain level controlled Vital Signs Assessment: post-procedure vital signs reviewed and stable Respiratory status: spontaneous breathing and respiratory function stable Cardiovascular status: stable Postop Assessment: no apparent nausea or vomiting Anesthetic complications: no   No notable events documented.  Last Vitals:  Vitals:   06/21/21 1315 06/21/21 1413  BP: (!) 144/85 (!) 152/92  Pulse: (!) 49 63  Resp: 18 16  Temp:  36.4 C  SpO2: 100% 99%    Last Pain:  Vitals:   06/21/21 1430  TempSrc:   PainSc: 0-No pain                 Lynna Zamorano DANIEL

## 2021-06-21 NOTE — H&P (Signed)
Chief Complaint: Patient was seen in consultation today for (L)PCN/NU placement at the request of Roberts  Referring Physician(s): Rogers  Supervising Physician: Corrie Mckusick  Patient Status: [facility] - Out-pt  History of Present Illness: Scott Oneal is a 80 y.o. male with multiple symptomatic left kidney and ureteral stones. He was seen by Urology and is planned for PCNL treatment today. He is referred to IR for (L)PCN/Nephroureteral access placement. PMHx, meds, labs, imaging, allergies reviewed. Feels well, no recent fevers, chills, illness. Has been NPO today as instructed.    Past Medical History:  Diagnosis Date   Allergy    Anxiety    Arthritis    Asthma    Benign neoplasm of colon    Calculus of gallbladder without mention of cholecystitis    Cataract    bilateral cateracts removed   Celiac disease    Cholelithiasis    Chronic kidney disease    Coronary atherosclerosis of unspecified type of vessel, native or graft    Dermatitis 06/08/2021   Diarrhea    Esophageal reflux    Glaucoma    Gout, unspecified    Hyperparathyroidism    Long term (current) use of anticoagulants    Loss of weight    Lower extremity weakness 05/31/2021   Macular degeneration    Old myocardial infarction    Osteoporosis, unspecified    Other malaise and fatigue    Other specified cardiac dysrhythmias(427.89)    Other testicular hypofunction    Pancreatic insufficiency 05/31/2021   Personal history of colonic polyps    Pure hypercholesterolemia    Thrombocytopenia (HCC)    chronic, per PCP notes dating back to 2009   Tinea pedis 05/31/2021   Unspecified asthma(493.90)    Unspecified essential hypertension    Unspecified hypothyroidism     Past Surgical History:  Procedure Laterality Date   cataract surgery Bilateral    COLONOSCOPY  06/26/2017   Fuller Plan   COLONOSCOPY WITH PROPOFOL N/A 11/27/2014   Procedure: COLONOSCOPY WITH PROPOFOL;  Surgeon: Milus Banister, MD;  Location: [place];  Service: Endoscopy;  Laterality: N/A;   CORONARY ANGIOPLASTY     CORONARY STENT PLACEMENT  2011   Cypher; in distal circumflex artery   CYSTOSCOPY  1998   EYE SURGERY     KNEE SURGERY     right   LEFT HEART CATH AND CORONARY ANGIOGRAPHY N/A 01/31/2020   Procedure: LEFT HEART CATH AND CORONARY ANGIOGRAPHY;  Surgeon: Belva Crome, MD;  Location: [place] [facility];  Service: Cardiovascular;  Laterality: N/A;   SHOULDER SURGERY Left 9/14   Dr Shara Blazing   UPPER GASTROINTESTINAL ENDOSCOPY     VASCULAR SURGERY      Allergies: Biaxin [clarithromycin], Tizanidine, Allantoin-pramoxine, Amlodipine, Benzalkonium chloride, Doxycycline, and Metoprolol tartrate  Medications: Prior to Admission medications   Medication Sig Start Date End Date Taking? Authorizing Provider  amoxicillin (AMOXIL) 500 MG capsule Take 1 capsule (500 mg total) by mouth 2 (two) times daily. To be TAKEN at first sign of infection recurring in leg, intense redness,  fevers, pain 06/18/21   Tommy Medal, Lavell Islam, MD  Ascorbic Acid (VITAMIN C PO) Take 1 tablet by mouth every other day.    [provider]  aspirin 81 MG tablet Take 81 mg by mouth every evening.     [provider]  B Complex Vitamins (B COMPLEX PO) Take 1 tablet by mouth 3 (three) times daily.    [provider]  calcitRIOL (  ROCALTROL) 0.25 MCG capsule TAKE 1 CAPSULE BY MOUTH EVERY DAY 05/18/21   Plotnikov, Evie Lacks, MD  carvedilol (COREG) 25 MG tablet TAKE 1 TABLET BY MOUTH TWICE A DAY WITH MEALS 12/28/20   Plotnikov, Evie Lacks, MD  cetirizine (ZYRTEC) 10 MG tablet Take 10 mg by mouth daily as needed for allergies.    [provider]  Cholecalciferol 2000 UNITS TABS Take 2,000 Units by mouth daily.    [provider]  clonazePAM (KLONOPIN) 0.25 MG disintegrating tablet TAKE 1 TABLET (0.25 MG TOTAL) BY MOUTH 2 (TWO) TIMES DAILY AS NEEDED (ANXIETY). 04/19/21   Plotnikov, Evie Lacks, MD   diclofenac sodium (VOLTAREN) 1 % GEL APPLY 4 G TOPICALLY 4 TIMES DAILY. Patient taking differently: Apply 1 application topically 4 (four) times daily as needed (pain). 02/13/18   Plotnikov, Evie Lacks, MD  hydrALAZINE (APRESOLINE) 10 MG tablet Take 1-2 tablet 3 times a day as needed if blood pressure is >170 04/07/20   Plotnikov, Evie Lacks, MD  isosorbide mononitrate (IMDUR) 30 MG 24 hr tablet TAKE 1 TABLET BY MOUTH EVERY DAY 12/28/20   Plotnikov, Evie Lacks, MD  levothyroxine (SYNTHROID) 175 MCG tablet Take 1 tablet (175 mcg total) by mouth daily. 04/17/21   Plotnikov, Evie Lacks, MD  lipase/protease/amylase (CREON) 36000 UNITS CPEP capsule Take 2 capsules (72,000 Units total) by mouth 3 (three) times daily with meals. May also take 1 capsule (36,000 Units total) as needed (with snacks). 05/05/21   Ladene Artist, MD  Multiple Vitamin (MULTIVITAMIN) capsule Take 1 capsule by mouth daily.    [provider]  Multiple Vitamins-Minerals (PRESERVISION AREDS 2) CAPS Take 1 capsule by mouth in the morning and at bedtime.    [provider]  nitroGLYCERIN (NITROSTAT) 0.4 MG SL tablet Place 0.4 mg under the tongue every 5 (five) minutes as needed for chest pain.    [provider]  olmesartan (BENICAR) 5 MG tablet TAKE 1 TABLET BY MOUTH EVERY DAY 03/22/21   Sherren Mocha, MD  pantoprazole (PROTONIX) 40 MG tablet Take 1 tablet (40 mg total) by mouth daily. 04/20/21   Ladene Artist, MD  Propylene Glycol (SYSTANE BALANCE) 0.6 % SOLN Place 1 drop into both eyes daily as needed (Dry eye). systane balance    [provider]  saccharomyces boulardii (FLORASTOR) 250 MG capsule Take 1 capsule (250 mg total) by mouth 2 (two) times daily. 04/05/21   Plotnikov, Evie Lacks, MD  simvastatin (ZOCOR) 10 MG tablet TAKE 1 TABLET BY MOUTH EVERYDAY AT BEDTIME 07/27/20   Plotnikov, Evie Lacks, MD  SYRINGE-NEEDLE, DISP, 3 ML (BD ECLIPSE SYRINGE) 23G X 1" 3 ML MISC As directed IM 11/24/20   Plotnikov,  Evie Lacks, MD  tamsulosin (FLOMAX) 0.4 MG CAPS capsule Take 0.4 mg by mouth daily.    [provider]  testosterone cypionate (DEPOTESTOSTERONE CYPIONATE) 200 MG/ML injection INJECT 2ML INTO MUSCLE EVERY 2 WEEKS Patient taking differently: Inject 400 mg into the muscle every 14 (fourteen) days. 04/19/21   Plotnikov, Evie Lacks, MD  TURMERIC PO Take 1 capsule by mouth 3 (three) times daily.    [provider]  UNABLE TO FIND Med Name: [name] take 2 tablets by mouth daily    [provider]     Family History  Problem Relation Age of Onset   Lymphoma Father    Hypertension Other    Vision loss Maternal Grandmother    Colon cancer Son 19   Asthma Neg Hx  Esophageal cancer Neg Hx    Rectal cancer Neg Hx    Stomach cancer Neg Hx    Colon polyps Neg Hx     Social History   Socioeconomic History   Marital status: Married    Spouse name: Jettie Lazare   Number of children: 3   Years of education: Not on file   Highest education level: Not on file  Occupational History   Occupation: retired    Fish farm manager: RETIRED    Comment: Engineering  Tobacco Use   Smoking status: Never   Smokeless tobacco: Never  Vaping Use   Vaping Use: Never used  Substance and Sexual Activity   Alcohol use: No   Drug use: No   Sexual activity: Yes  Other Topics Concern   Not on file  Social History Narrative   Retired.   Regular Exercise-Yes; yoga & silver sneakers   Daily Caffeine Use.            Social Determinants of Health   Financial Resource Strain: Low Risk    Difficulty of Paying Living Expenses: Not hard at all  Food Insecurity: No Food Insecurity   Worried About Charity fundraiser in the Last Year: Never true   [place] in the Last Year: Never true  Transportation Needs: No Transportation Needs   Lack of Transportation (Medical): No   Lack of Transportation (Non-Medical): No  Physical Activity: Sufficiently Active   Days of Exercise per Week: 5  days   Minutes of Exercise per Session: 30 min  Stress: No Stress Concern Present   Feeling of Stress : Not at all  Social Connections: Socially Integrated   Frequency of Communication with Friends and Family: More than three times a week   Frequency of Social Gatherings with Friends and Family: More than three times a week   Attends Religious Services: More than 4 times per year   Active Member of [employer] or Organizations: Yes   Attends Music therapist: More than 4 times per year   Marital Status: Married    Review of Systems: A 12 point ROS discussed and pertinent positives are indicated in the HPI above.  All other systems are negative.  Review of Systems  Vital Signs: Temp: 98.4 HR: 160/74 HR: 64 RR:16  Physical Exam HENT:     Mouth/Throat:     Mouth: Mucous membranes are moist.     Pharynx: Oropharynx is clear.  Cardiovascular:     Rate and Rhythm: Normal rate and regular rhythm.     Heart sounds: Normal heart sounds.  Pulmonary:     Effort: Pulmonary effort is normal. No respiratory distress.     Breath sounds: Normal breath sounds.  Abdominal:     General: Abdomen is flat. There is no distension.     Palpations: Abdomen is soft.  Neurological:     General: No focal deficit present.     Mental Status: He is alert and oriented to person, place, and time.  Psychiatric:        Mood and Affect: Mood normal.        Thought Content: Thought content normal.        Judgment: Judgment normal.    Imaging: DG Lumbar Spine 2-3 Views  Result Date: 06/01/2021 CLINICAL DATA:  Lower extremity weakness. Lumbar pain. Cellulitis of lower leg. EXAM: LUMBAR SPINE - 2-3 VIEW COMPARISON:  Lumbar radiograph 12/14/2020. MRI 01/05/2021, abdominopelvic CT 04/29/2021 FINDINGS: Five lumbar type vertebra mild  levo scoliotic curvature. Trace retrolisthesis of L2 on L3 and L3 on L4 is unchanged from prior exam. Diffuse disc space narrowing and endplate spurring, not significantly  changed. Lower lumbar facet hypertrophy, not significantly changed. Vertebral body heights are preserved no evidence of fracture or focal bone lesion. Bilateral nephrolithiasis. There is a new calcification measuring 7 mm at the level of L3-L4 that may be a ureteral stone. IMPRESSION: 1. Stable multilevel degenerative disc disease and facet hypertrophy of the lumbar spine. 2. Bilateral nephrolithiasis. Calcification to the left of L3-L4 corresponds to ureteral stone on 04/29/2021 abdominopelvic CT. Electronically Signed   By: Keith Rake M.D.   On: 06/01/2021 23:05   DG Tibia/Fibula Left  Result Date: 06/01/2021 CLINICAL DATA:  80 year old male with leg pain, cellulitis. EXAM: LEFT TIBIA AND FIBULA - 2 VIEW COMPARISON:  05/13/2021 FINDINGS: There is no evidence of fracture or other focal bone lesions. Mild tricompartmental degenerative changes of the knee. Decreased subcutaneous edema from comparison. Atherosclerotic calcifications are noted. IMPRESSION: Decreased subcutaneous edema from comparison. No acute osseous abnormality. Electronically Signed   By: Ruthann Cancer MD   On: 06/01/2021 14:56   MR LUMBAR SPINE WO CONTRAST  Result Date: 06/13/2021 CLINICAL DATA:  Epidural abscess. Pain radiating into left leg, left leg weakness and trouble bearing weight when walking Weakness of left lower extremity EXAM: MRI LUMBAR SPINE WITHOUT CONTRAST TECHNIQUE: Multiplanar, multisequence MR imaging of the lumbar spine was performed. No intravenous contrast was administered. COMPARISON:  01/05/2021 FINDINGS: Segmentation:  Standard Alignment:  Levoscoliosis with apex at L4 Vertebrae:  No fracture, evidence of discitis, or bone lesion. Conus medullaris and cauda equina: Conus extends to the L1 level. Conus and cauda equina appear normal. Paraspinal and other soft tissues: Negative. Disc levels: L1-L2: Normal disc space and facet joints. No spinal canal stenosis. No neural foraminal stenosis. L2-L3: Unchanged small  disc bulge. No spinal canal stenosis. Unchanged mild left neural foraminal stenosis. L3-L4: Unchanged intermediate sized disc bulge with endplate spurring. Right lateral recess narrowing without central spinal canal stenosis. Unchanged severe right and mild left neural foraminal stenosis. L4-L5: Unchanged intermediate sized disc bulge with mild facet hypertrophy. Unchanged mild spinal canal stenosis. Unchanged severe right and mild left neural foraminal stenosis. L5-S1: Unchanged small disc bulge and mild facet hypertrophy. No spinal canal stenosis. Unchanged severe left neural foraminal stenosis. Visualized sacrum: Normal. IMPRESSION: 1. No epidural abscess or other acute abnormality of the lumbar spine. 2. Unchanged mild L4-L5 spinal canal stenosis. 3. Unchanged multilevel severe neural foraminal stenosis, right L3-L4, right L4-L5 and left L5-S1. Electronically Signed   By: Ulyses Jarred M.D.   On: 06/13/2021 20:06   MR TIBIA FIBULA LEFT WO CONTRAST  Result Date: 06/14/2021 CLINICAL DATA:  Leg swelling and erythema with history of cellulitis for 2 months. Osteomyelitis suspected. EXAM: MRI OF LOWER LEFT EXTREMITY WITHOUT CONTRAST TECHNIQUE: Multiplanar, multisequence MR imaging of the left lower leg was performed. No intravenous contrast was administered. COMPARISON:  Radiographs 05/31/2021 and 05/13/2021. FINDINGS: Bones/Joint/Cartilage Images extend from the knee through the ankle. There is limited joint valuation due to edge of field distortion. The coronal images include the right lower leg. The left tibia and fibula appear normal without evidence of osteomyelitis. There is no evidence of acute fracture or dislocation. No large effusions are seen at the left knee or ankle. Ligaments Not relevant for exam/indication. Muscles and Tendons There is multifocal muscular edema, most prominent within the tibialis anterior muscle proximally. There is focal fat signal within  the medial head of the left gastrocnemius  muscle near the musculotendinous junction, probably related to prior injury. No focal intramuscular fluid collections. The visualized tendons appear intact. Soft tissues Mild diffuse subcutaneous edema within the left lower leg, greatest distally. Coronal images demonstrate milder soft tissue edema in the right lower leg. IMPRESSION: 1. Nonspecific soft tissue edema in the lower legs, left greater than right, involving the subcutaneous fat and muscles. No focal fluid collections identified. 2. The muscular involvement could reflect myositis or subacute denervation. Suspected remote injury at the musculotendinous junction of the medial head of the left gastrocnemius muscle. 3. No evidence of left lower leg osteomyelitis. Electronically Signed   By: Richardean Sale M.D.   On: 06/14/2021 10:34    Labs:  CBC: Recent Labs    04/13/21 1124 05/31/21 1452 06/16/21 1328 06/21/21 0825  WBC 6.6 5.9 5.7 4.6  HGB 15.2 12.6* 12.1* 11.8*  HCT 45.3 39.1 38.3* 37.5*  PLT 162.0 92* 90* 88*    COAGS: Recent Labs    06/21/21 0825  INR 1.1  APTT 29    BMP: Recent Labs    04/13/21 1124 04/15/21 1615 05/31/21 1452 06/16/21 1328  NA 140 139 143 141  K 4.8 4.8 4.7 4.8  CL 104 103 109 103  CO2 30 29 29 30   GLUCOSE 83 81 102* 118*  BUN 14 18 24 21   CALCIUM 9.3 8.9 9.1 8.9  CREATININE 2.08* 2.25* 2.42* 2.32*  GFRNONAA  --   --   --  28*    LIVER FUNCTION TESTS: Recent Labs    08/24/20 1058 02/24/21 1137 04/13/21 1124 04/15/21 1615 05/31/21 1452  BILITOT 1.1 1.1 1.1 0.7 0.6  AST 24 28 27 25 31   ALT 23 30 29 23 25   ALKPHOS 61 56 69 62  --   PROT 6.0 6.9 6.6 5.9* 5.4*  ALBUMIN 4.0 4.3 3.9 3.6  --     TUMOR MARKERS: No results for input(s): AFPTM, CEA, CA199, CHROMGRNA in the last 8760 hours.  Assessment and Plan: (L)kidney and ureteral stones Plan for image guided (L) PCN/Nephroureteral access placement Labs reviewed. Risks and benefits of (L) PCN/NU placement was discussed with  the patient including, but not limited to, infection, bleeding, significant bleeding causing loss or decrease in renal function or damage to adjacent structures.   All of the patient's questions were answered, patient is agreeable to proceed.  Consent signed and in chart.    Thank you for this interesting consult.  I greatly enjoyed meeting COLSTON PYLE and look forward to participating in their care.  A copy of this report was sent to the requesting provider on this date.  Electronically Signed: Ascencion Dike, PA-C 06/21/2021, 9:18 AM   I spent a total of 20 minutes in face to face in clinical consultation, greater than 50% of which was counseling/coordinating care for (L)PCN/NU

## 2021-06-21 NOTE — Anesthesia Procedure Notes (Signed)
Procedure Name: Intubation Date/Time: 06/21/2021 11:02 AM Performed by: Lavina Hamman, CRNA Pre-anesthesia Checklist: Patient identified, Emergency Drugs available, Suction available, Patient being monitored and Timeout performed Patient Re-evaluated:Patient Re-evaluated prior to induction Oxygen Delivery Method: Circle system utilized Preoxygenation: Pre-oxygenation with 100% oxygen Induction Type: IV induction Ventilation: Mask ventilation without difficulty Laryngoscope Size: Mac and 4 Grade View: Grade II Tube type: Oral Tube size: 7.5 mm Number of attempts: 1 Airway Equipment and Method: Stylet Placement Confirmation: ETT inserted through vocal cords under direct vision, positive ETCO2, CO2 detector and breath sounds checked- equal and bilateral Secured at: 23 cm Tube secured with: Tape Dental Injury: Teeth and Oropharynx as per pre-operative assessment  Comments: ATOI

## 2021-06-21 NOTE — H&P (Signed)
H&P  Chief Complaint: Left-sided kidney stones  History of Present Illness: 80 year old male with history of urolithiasis recently presented with CT findings of an 18 mm left renal pelvis stone and a 9 mm left lower pole stone.  He has had symptoms from these.  He presents at this time for percutaneous management of these left renal calculi.  Past Medical History:  Diagnosis Date   Allergy    Anxiety    Arthritis    Asthma    Benign neoplasm of colon    Calculus of gallbladder without mention of cholecystitis    Cataract    bilateral cateracts removed   Celiac disease    Cholelithiasis    Chronic kidney disease    Coronary atherosclerosis of unspecified type of vessel, native or graft    Dermatitis 06/08/2021   Diarrhea    Esophageal reflux    Glaucoma    Gout, unspecified    Hyperparathyroidism    Long term (current) use of anticoagulants    Loss of weight    Lower extremity weakness 05/31/2021   Macular degeneration    Old myocardial infarction    Osteoporosis, unspecified    Other malaise and fatigue    Other specified cardiac dysrhythmias(427.89)    Other testicular hypofunction    Pancreatic insufficiency 05/31/2021   Personal history of colonic polyps    Pure hypercholesterolemia    Thrombocytopenia (HCC)    chronic, per PCP notes dating back to 2009   Tinea pedis 05/31/2021   Unspecified asthma(493.90)    Unspecified essential hypertension    Unspecified hypothyroidism     Past Surgical History:  Procedure Laterality Date   cataract surgery Bilateral    COLONOSCOPY  06/26/2017   Fuller Plan   COLONOSCOPY WITH PROPOFOL N/A 11/27/2014   Procedure: COLONOSCOPY WITH PROPOFOL;  Surgeon: Milus Banister, MD;  Location: [place];  Service: Endoscopy;  Laterality: N/A;   CORONARY ANGIOPLASTY     CORONARY STENT PLACEMENT  2011   Cypher; in distal circumflex artery   CYSTOSCOPY  1998   EYE SURGERY     KNEE SURGERY     right   LEFT HEART CATH AND CORONARY  ANGIOGRAPHY N/A 01/31/2020   Procedure: LEFT HEART CATH AND CORONARY ANGIOGRAPHY;  Surgeon: Belva Crome, MD;  Location: [place] [facility];  Service: Cardiovascular;  Laterality: N/A;   SHOULDER SURGERY Left 9/14   Dr Shara Blazing   UPPER GASTROINTESTINAL ENDOSCOPY     VASCULAR SURGERY      Home Medications:    Allergies:  Allergies  Allergen Reactions   Biaxin [Clarithromycin]     Dizziness   Tizanidine     Felt funny   Allantoin-Pramoxine Other (See Comments)    Pt does't remember reaction   Amlodipine     Edema    Benzalkonium Chloride     Other reaction(s): mild rash/itching   Doxycycline Photosensitivity    rash   Metoprolol Tartrate Other (See Comments)    REACTION: fatigue    Family History  Problem Relation Age of Onset   Lymphoma Father    Hypertension Other    Vision loss Maternal Grandmother    Colon cancer Son 40   Asthma Neg Hx    Esophageal cancer Neg Hx    Rectal cancer Neg Hx    Stomach cancer Neg Hx    Colon polyps Neg Hx     Social History:  reports that he has never smoked. He has never used smokeless tobacco.  He reports that he does not drink alcohol and does not use drugs.  ROS: A complete review of systems was performed.  All systems are negative except for pertinent findings as noted.  Physical Exam:  Vital signs in last 24 hours: BP (!) 160/74   Pulse 64   Temp 98.4 F (36.9 C) (Oral)   Resp 16   Ht 6' (1.829 m)   Wt 85.3 kg   SpO2 99%   BMI 25.50 kg/m  Constitutional:  Alert and oriented, No acute distress Cardiovascular: Regular rate  Respiratory: Normal respiratory effort GI: Abdomen is soft, nontender, nondistended, no abdominal masses. No CVAT.  Genitourinary: Normal male phallus, testes are descended bilaterally and non-tender and without masses, scrotum is normal in appearance without lesions or masses, perineum is normal on inspection. Lymphatic: No lymphadenopathy Neurologic: Grossly intact, no focal deficits Psychiatric:  Normal mood and affect  Laboratory Data:  Recent Labs    06/21/21 0825  WBC 4.6  HGB 11.8*  HCT 37.5*  PLT 88*    No results for input(s): NA, K, CL, GLUCOSE, BUN, CALCIUM, CREATININE in the last 72 hours.  Invalid input(s): CO3   Results for orders placed or performed during the hospital encounter of 06/21/21 (from the past 24 hour(s))  CBC upon arrival     Status: Abnormal   Collection Time: 06/21/21  8:25 AM  Result Value Ref Range   WBC 4.6 4.0 - 10.5 K/uL   RBC 3.92 (L) 4.22 - 5.81 MIL/uL   Hemoglobin 11.8 (L) 13.0 - 17.0 g/dL   HCT 37.5 (L) 39.0 - 52.0 %   MCV 95.7 80.0 - 100.0 fL   MCH 30.1 26.0 - 34.0 pg   MCHC 31.5 30.0 - 36.0 g/dL   RDW 15.9 (H) 11.5 - 15.5 %   Platelets 88 (L) 150 - 400 K/uL   nRBC 0.0 0.0 - 0.2 %  APTT upon arrival     Status: None   Collection Time: 06/21/21  8:25 AM  Result Value Ref Range   aPTT 29 24 - 36 seconds  Protime-INR upon arrival     Status: None   Collection Time: 06/21/21  8:25 AM  Result Value Ref Range   Prothrombin Time 14.1 11.4 - 15.2 seconds   INR 1.1 0.8 - 1.2   Recent Results (from the past 240 hour(s))  SARS Coronavirus 2 (TAT 6-24 hrs)     Status: None   Collection Time: 06/17/21 12:00 AM  Result Value Ref Range Status   SARS Coronavirus 2 RESULT: NEGATIVE  Final    Comment: RESULT: NEGATIVESARS-CoV-2 INTERPRETATION:A NEGATIVE  test result means that SARS-CoV-2 RNA was not present in the specimen above the limit of detection of this test. This does not preclude a possible SARS-CoV-2 infection and should not be used as the  sole basis for patient management decisions. Negative results must be combined with clinical observations, patient history, and epidemiological information. Optimum specimen types and timing for peak viral levels during infections caused by SARS-CoV-2  have not been determined. Collection of multiple specimens or types of specimens may be necessary to detect virus. Improper specimen collection and  handling, sequence variability under primers/probes, or organism present below the limit of detection may  lead to false negative results. Positive and negative predictive values of testing are highly dependent on prevalence. False negative test results are more likely when prevalence of disease is high.The expected result is NEGATIVE.Fact S heet for  Healthcare Providers: [email or web address].no Sheet for  Patients: [email or web address] Reference Range - Negative     Renal Function: Recent Labs    06/16/21 1328  CREATININE 2.32*   Estimated Creatinine Clearance: 28.3 mL/min (A) (by C-G formula based on SCr of 2.32 mg/dL (H)).  Radiologic Imaging: No results found.  Impression/Assessment:  Large left renal calculi   Plan: Left percutaneous nephrolithotomy

## 2021-06-21 NOTE — Procedures (Signed)
Interventional Radiology Procedure Note  Procedure: Image guided left nephroureteral catheter placement, pre-operative for pending nephro-lithotomy.   Findings:  A posterior inferior calyx was selected, within which stone was identified, just inferior to the 12th rib.  The kumpe catheter terminates in the urinary bladder and will accept any 035 wire into the bladder. .  Complications: None  EBL: None Recommendations: - Nephroureteral catheter sutured to skin, terminates within the bladder.  - NPO for OR today - stable to recovery  Signed,  Dulcy Fanny. Earleen [name], DO

## 2021-06-21 NOTE — Transfer of Care (Signed)
Immediate Anesthesia Transfer of Care Note  Patient: Scott Oneal  Procedure(s) Performed: Procedure(s) with comments: NEPHROLITHOTOMY PERCUTANEOUS (Left) - 78 MINS  Patient Location: PACU  Anesthesia Type:General  Level of Consciousness:  sedated, patient cooperative and responds to stimulation  Airway & Oxygen Therapy:Patient Spontanous Breathing and Patient connected to face mask oxgen  Post-op Assessment:  Report given to PACU RN and Post -op Vital signs reviewed and stable  Post vital signs:  Reviewed and stable  Last Vitals:  Vitals:   06/21/21 0826  BP: (!) 160/74  Pulse: 64  Resp: 16  Temp: 36.9 C  SpO2: 83%    Complications: No apparent anesthesia complications

## 2021-06-21 NOTE — Progress Notes (Signed)
.  Post-op note  Subjective: The patient is doing well.  No complaints. HE looks good.  Objective: Vital signs in last 24 hours: Temp:  [97.3 F (36.3 C)-98.5 F (36.9 C)] 98.1 F (36.7 C) (08/22 1647) Pulse Rate:  [49-73] 70 (08/22 1647) Resp:  [8-20] 20 (08/22 1647) BP: (116-160)/(71-99) 144/83 (08/22 1647) SpO2:  [97 %-100 %] 97 % (08/22 1647) Weight:  [85.3 kg] 85.3 kg (08/22 0848)  Intake/Output from previous day: No intake/output data recorded. Intake/Output this shift: Total I/O In: 1300 [I.V.:1000; IV Piggyback:300] Out: 50 [Blood:50]  Physical Exam:  General: Alert and oriented.  Urine clear pink w/o clots.  Lab Results: Recent Labs    06/21/21 0825  HGB 11.8*  HCT 37.5*    Assessment/Plan: POD#0  Doing well  1) Continue to monitor 2) Discussed postop course w/ pt--possibly home tomorrow   Lillette Boxer. Jaionna Weisse, MD   LOS: 0 days   Lillette Boxer Elvyn Krohn 06/21/2021, 5:26 PM

## 2021-06-21 NOTE — Op Note (Signed)
Preoperative diagnosis: Multiple left renal calculi, largest 17 mm in size  Postoperative diagnosis: Same  Principal procedure: Left percutaneous nephrolithotomy, placement of 6 French by 28 cm contour double-J stent without tether, antegrade nephrostogram, fluoroscopic interpretation  Surgeon: Chelcy Bolda  Anesthesia: General endotracheal  Complications: None  Drains: 28 French Foley catheter, 28 cm 6 [place] contour double-J stent, 54 French council tip catheter for nephrostomy tube  Estimated blood loss: Less than 100 mL  Specimen: Stone fragments  Indications: 80 year old male recently presenting with symptomatic left renal calculi.  The largest of these is approximately 17 mm, in the left renal pelvis/UPJ area.  Additionally, on imaging and interventional radiology he had a left upper ureteral stone.  He also has 2 calyceal stones in the interpolar region.  He presents at this time for percutaneous management of all of these calculi.  I have explained the procedure with the patient, expected outcomes, risks and complications.  These include but are not limited to need for external drainage, blood loss, possible transfusion, urinary tract infection, anesthetic complications, among others.  He understands and desires to proceed.  Findings: There was a large single left renal pelvic calculus.  On fluoroscopic exam there was another calcification in the area of the left upper ureter.  Additionally, 2 calyceal calculi were noted.  There was minimal dilation of the renal pelvis.  Description of procedure: The patient was properly identified and marked in the holding area.  He had come from interventional radiology where a Kumpe catheter was placed for nephrostomy tube.  He was taken to the operating room where general anesthetic was administered.  Bladder was drained with a 16 French Foley catheter.  He was then transferred to the prone position.  All extremities were properly padded, pressure  points were also padded.  His left flank was prepped around the percutaneous tube.  He was then draped.  Proper timeout was performed.  I passed a Super Stiff guidewire through the nephrostomy tube, guiding it into the bladder using fluoroscopy.  Nephrostomy tube was then removed.  Incision made beside the guidewire.  Subcutaneous dissection performed with hemostat.  I then passed the peel-away 12 French access catheter over top of the working guidewire.  Safety wire was then passed, peel-away catheter was removed.  Over top of the working guidewire using fluoroscopic guidance I passed the NephroMax balloon up to the area of the visible stone.  Balloon inflated to 20 atm of pressure in the nephrostomy access sheath was easily passed into proper position using fluoroscopic guidance over top of the balloon.  The balloon was deflated and removed.  I then passed the rigid nephroscope into the renal pelvis.  Small clots were picked out.  Using the trilogy apparatus the renal pelvic stone was fragmented and removed.  All small fragments and large were removed and saved for specimen.  I then passed the flexible nephroscope into the ureter.  The upper ureteral stone was grasped with the nitinol basket and extracted.  The same scope was then utilized to inspect the interpolar calyces.  2 stones were identified and easily removed with the same basket.  I then used the flexible ureteroscope to inspect all other calyces.  No further calculus material was seen.  At this point, I felt that the patient was stone free.  I passed a 6 [place] by 28 cm contour double-J stent with tether removed over top of the working guidewire.  This was advanced into the bladder using fluoroscopic guidance.  Once properly  positioned the guidewire was removed and a good curl was seen both in the bladder and the renal pelvis.  The scope was removed.  The nephrostomy sheath was also removed.  I then negotiated the council tip catheter over top of the  guidewire using fluoroscopic guidance into the area of the renal pelvis.  Once properly positioned the balloon was filled with 3 cc of water.  I then performed a nephrostogram using Omnipaque.  No extravasation was seen.  There was easy passage of contrast down through the ureter.  The catheter was then sutured into the position with 2-0 silk.  Vertical mattress sutures used for skin closure/hemostasis.  Dry sterile dressings were placed.  The nephrostomy tube was hooked to dependent drainage.  At this point, the procedure was terminated.  The patient was transferred to the supine position.  He was awakened, extubated, and taken to the PACU in stable condition, having tolerated the procedure well.  Sponge needle and instrument counts were correct x2.

## 2021-06-21 NOTE — Interval H&P Note (Signed)
History and Physical Interval Note:  06/21/2021 10:18 AM  Scott Oneal  has presented today for surgery, with the diagnosis of LEFT RENAL AND URETERAL CALCULI.  The various methods of treatment have been discussed with the patient and family. After consideration of risks, benefits and other options for treatment, the patient has consented to  Procedure(s) with comments: NEPHROLITHOTOMY PERCUTANEOUS (Left) - 90 MINS HOLMIUM LASER APPLICATION (Left) as a surgical intervention.  The patient's history has been reviewed, patient examined, no change in status, stable for surgery.  I have reviewed the patient's chart and labs.  Questions were answered to the patient's satisfaction.     Lillette Boxer Davette Nugent

## 2021-06-22 ENCOUNTER — Encounter (HOSPITAL_COMMUNITY): Payer: Self-pay | Admitting: Urology

## 2021-06-22 DIAGNOSIS — N200 Calculus of kidney: Secondary | ICD-10-CM | POA: Diagnosis not present

## 2021-06-22 LAB — BASIC METABOLIC PANEL
Anion gap: 9 (ref 5–15)
BUN: 26 mg/dL — ABNORMAL HIGH (ref 8–23)
CO2: 27 mmol/L (ref 22–32)
Calcium: 8.3 mg/dL — ABNORMAL LOW (ref 8.9–10.3)
Chloride: 104 mmol/L (ref 98–111)
Creatinine, Ser: 2.69 mg/dL — ABNORMAL HIGH (ref 0.61–1.24)
GFR, Estimated: 23 mL/min — ABNORMAL LOW (ref >60)
Glucose, Bld: 131 mg/dL — ABNORMAL HIGH (ref 70–99)
Potassium: 4.5 mmol/L (ref 3.5–5.1)
Sodium: 140 mmol/L (ref 135–145)

## 2021-06-22 LAB — HEMOGLOBIN AND HEMATOCRIT, BLOOD
HCT: 36 % — ABNORMAL LOW (ref 39.0–52.0)
Hemoglobin: 11.2 g/dL — ABNORMAL LOW (ref 13.0–17.0)

## 2021-06-22 NOTE — Progress Notes (Signed)
Patient discharged home per order.  Reviewed AVS with patient and he verbalizes understanding.  IV removed.  Patient to lobby per wheelchair where son is picking up.

## 2021-06-22 NOTE — Plan of Care (Signed)
  Problem: Health Behavior/Discharge Planning: Goal: Ability to manage health-related needs will improve Outcome: Progressing   Problem: Elimination: Goal: Will not experience complications related to bowel motility Outcome: Progressing Goal: Will not experience complications related to urinary retention Outcome: Progressing   Problem: Clinical Measurements: Goal: Will remain free from infection Outcome: Progressing

## 2021-06-22 NOTE — Plan of Care (Signed)

## 2021-06-22 NOTE — Discharge Summary (Signed)
Physician Discharge Summary  Patient ID: Scott Oneal MRN: [id number] DOB/AGE: June 02, 1941 80 y.o.  Admit date: 06/21/2021 Discharge date: 06/22/2021  Admission Diagnoses:  Kidney stone  Discharge Diagnoses:  Principal Problem:   Kidney stone   Past Medical History:  Diagnosis Date   Allergy    Anxiety    Arthritis    Asthma    Benign neoplasm of colon    Calculus of gallbladder without mention of cholecystitis    Cataract    bilateral cateracts removed   Celiac disease    Cholelithiasis    Chronic kidney disease    Coronary atherosclerosis of unspecified type of vessel, native or graft    Dermatitis 06/08/2021   Diarrhea    Esophageal reflux    Glaucoma    Gout, unspecified    Hyperparathyroidism    Long term (current) use of anticoagulants    Loss of weight    Lower extremity weakness 05/31/2021   Macular degeneration    Old myocardial infarction    Osteoporosis, unspecified    Other malaise and fatigue    Other specified cardiac dysrhythmias(427.89)    Other testicular hypofunction    Pancreatic insufficiency 05/31/2021   Personal history of colonic polyps    Pure hypercholesterolemia    Thrombocytopenia (HCC)    chronic, per PCP notes dating back to 2009   Tinea pedis 05/31/2021   Unspecified asthma(493.90)    Unspecified essential hypertension    Unspecified hypothyroidism     Surgeries: Procedure(s): NEPHROLITHOTOMY PERCUTANEOUS on 06/21/2021   Consultants (if any):   Discharged Condition: Improved  Hospital Course: Scott Oneal is an 80 y.o. male who was admitted 06/21/2021 with a diagnosis of Kidney stone and went to the operating room on 06/21/2021 and underwent the above named procedures.  His foley was removed and the NT clamped this AM.  He did well with voiding and had no significant pain with clamping.  The tube was opened and there was no retained urine, so it was removed and the wound redressed.  He was discharged home.   He has a right  ureteral stent in place.   He was given perioperative antibiotics:  Anti-infectives (From admission, onward)    Start     Dose/Rate Route Frequency Ordered Stop   06/21/21 2200  sulfamethoxazole-trimethoprim (BACTRIM DS) 800-160 MG per tablet 1 tablet        1 tablet Oral Every 12 hours 06/21/21 1352 06/28/21 2159   06/21/21 0815  ciprofloxacin (CIPRO) IVPB 400 mg        400 mg 200 mL/hr over 60 Minutes Intravenous 60 min pre-op 06/21/21 0808 06/21/21 1144   06/21/21 0815  cefTRIAXone (ROCEPHIN) 2 g in sodium chloride 0.9 % 100 mL IVPB        2 g 200 mL/hr over 30 Minutes Intravenous  Once 06/21/21 0808 06/21/21 1353     .  He was given sequential compression devices for DVT prophylaxis.  He benefited maximally from the hospital stay and there were no complications.    Recent vital signs:  Vitals:   06/22/21 0232 06/22/21 0658  BP: (!) 150/89 (!) 154/91  Pulse: 78 81  Resp: 16 16  Temp: 98 F (36.7 C) 98.7 F (37.1 C)  SpO2: 99% 100%    Recent laboratory studies:  Lab Results  Component Value Date   HGB 11.2 (L) 06/22/2021   HGB 11.8 (L) 06/21/2021   HGB 12.1 (L) 06/16/2021   Lab Results  Component Value  Date   WBC 4.6 06/21/2021   PLT 88 (L) 06/21/2021   Lab Results  Component Value Date   INR 1.1 06/21/2021   Lab Results  Component Value Date   NA 140 06/22/2021   K 4.5 06/22/2021   CL 104 06/22/2021   CO2 27 06/22/2021   BUN 26 (H) 06/22/2021   CREATININE 2.69 (H) 06/22/2021   GLUCOSE 131 (H) 06/22/2021    Discharge Medications:   Allergies as of 06/22/2021       Reactions   Biaxin [clarithromycin]    Dizziness   Tizanidine    Felt funny   Allantoin-pramoxine Other (See Comments)   Pt does't remember reaction   Amlodipine    Edema    Benzalkonium Chloride    Other reaction(s): mild rash/itching   Doxycycline Photosensitivity   rash   Metoprolol Tartrate Other (See Comments)   REACTION: fatigue        Medication List     STOP  taking these medications    amoxicillin 500 MG capsule Commonly known as: AMOXIL       TAKE these medications    aspirin 81 MG tablet Take 81 mg by mouth every evening.   B COMPLEX PO Take 1 tablet by mouth 3 (three) times daily.   BD Eclipse Syringe 23G X 1" 3 ML Misc Generic drug: SYRINGE-NEEDLE (DISP) 3 ML As directed IM   calcitRIOL 0.25 MCG capsule Commonly known as: ROCALTROL TAKE 1 CAPSULE BY MOUTH EVERY DAY   carvedilol 25 MG tablet Commonly known as: COREG TAKE 1 TABLET BY MOUTH TWICE A DAY WITH MEALS   cetirizine 10 MG tablet Commonly known as: ZYRTEC Take 10 mg by mouth daily as needed for allergies.   Cholecalciferol 50 MCG (2000 UT) Tabs Take 2,000 Units by mouth daily.   clonazePAM 0.25 MG disintegrating tablet Commonly known as: KLONOPIN TAKE 1 TABLET (0.25 MG TOTAL) BY MOUTH 2 (TWO) TIMES DAILY AS NEEDED (ANXIETY).   diclofenac sodium 1 % Gel Commonly known as: VOLTAREN APPLY 4 G TOPICALLY 4 TIMES DAILY. What changed: See the new instructions.   hydrALAZINE 10 MG tablet Commonly known as: APRESOLINE Take 1-2 tablet 3 times a day as needed if blood pressure is >170   isosorbide mononitrate 30 MG 24 hr tablet Commonly known as: IMDUR TAKE 1 TABLET BY MOUTH EVERY DAY   levothyroxine 175 MCG tablet Commonly known as: SYNTHROID Take 1 tablet (175 mcg total) by mouth daily.   lipase/protease/amylase 36000 UNITS Cpep capsule Commonly known as: Creon Take 2 capsules (72,000 Units total) by mouth 3 (three) times daily with meals. May also take 1 capsule (36,000 Units total) as needed (with snacks).   multivitamin capsule Take 1 capsule by mouth daily.   nitroGLYCERIN 0.4 MG SL tablet Commonly known as: NITROSTAT Place 0.4 mg under the tongue every 5 (five) minutes as needed for chest pain.   olmesartan 5 MG tablet Commonly known as: BENICAR TAKE 1 TABLET BY MOUTH EVERY DAY   oxyCODONE 5 MG immediate release tablet Commonly known as: Oxy  IR/ROXICODONE Take 1 tablet (5 mg total) by mouth every 6 (six) hours as needed for moderate pain or severe pain.   pantoprazole 40 MG tablet Commonly known as: PROTONIX Take 1 tablet (40 mg total) by mouth daily.   PreserVision AREDS 2 Caps Take 1 capsule by mouth in the morning and at bedtime.   saccharomyces boulardii 250 MG capsule Commonly known as: Florastor Take 1 capsule (250 mg  total) by mouth 2 (two) times daily.   simvastatin 10 MG tablet Commonly known as: ZOCOR TAKE 1 TABLET BY MOUTH EVERYDAY AT BEDTIME   Systane Balance 0.6 % Soln Generic drug: Propylene Glycol Place 1 drop into both eyes daily as needed (Dry eye). systane balance   tamsulosin 0.4 MG Caps capsule Commonly known as: FLOMAX Take 0.4 mg by mouth daily.   testosterone cypionate 200 MG/ML injection Commonly known as: DEPOTESTOSTERONE CYPIONATE INJECT 2ML INTO MUSCLE EVERY 2 WEEKS What changed: See the new instructions.   TURMERIC PO Take 1 capsule by mouth 3 (three) times daily.   UNABLE TO FIND Med Name: [name] take 2 tablets by mouth daily   VITAMIN C PO Take 1 tablet by mouth every other day.        Diagnostic Studies: DG Lumbar Spine 2-3 Views  Result Date: 06/01/2021 CLINICAL DATA:  Lower extremity weakness. Lumbar pain. Cellulitis of lower leg. EXAM: LUMBAR SPINE - 2-3 VIEW COMPARISON:  Lumbar radiograph 12/14/2020. MRI 01/05/2021, abdominopelvic CT 04/29/2021 FINDINGS: Five lumbar type vertebra mild levo scoliotic curvature. Trace retrolisthesis of L2 on L3 and L3 on L4 is unchanged from prior exam. Diffuse disc space narrowing and endplate spurring, not significantly changed. Lower lumbar facet hypertrophy, not significantly changed. Vertebral body heights are preserved no evidence of fracture or focal bone lesion. Bilateral nephrolithiasis. There is a new calcification measuring 7 mm at the level of L3-L4 that may be a ureteral stone. IMPRESSION: 1. Stable multilevel degenerative disc  disease and facet hypertrophy of the lumbar spine. 2. Bilateral nephrolithiasis. Calcification to the left of L3-L4 corresponds to ureteral stone on 04/29/2021 abdominopelvic CT. Electronically Signed   By: Keith Rake M.D.   On: 06/01/2021 23:05   DG Tibia/Fibula Left  Result Date: 06/01/2021 CLINICAL DATA:  80 year old male with leg pain, cellulitis. EXAM: LEFT TIBIA AND FIBULA - 2 VIEW COMPARISON:  05/13/2021 FINDINGS: There is no evidence of fracture or other focal bone lesions. Mild tricompartmental degenerative changes of the knee. Decreased subcutaneous edema from comparison. Atherosclerotic calcifications are noted. IMPRESSION: Decreased subcutaneous edema from comparison. No acute osseous abnormality. Electronically Signed   By: Ruthann Cancer MD   On: 06/01/2021 14:56   MR LUMBAR SPINE WO CONTRAST  Result Date: 06/13/2021 CLINICAL DATA:  Epidural abscess. Pain radiating into left leg, left leg weakness and trouble bearing weight when walking Weakness of left lower extremity EXAM: MRI LUMBAR SPINE WITHOUT CONTRAST TECHNIQUE: Multiplanar, multisequence MR imaging of the lumbar spine was performed. No intravenous contrast was administered. COMPARISON:  01/05/2021 FINDINGS: Segmentation:  Standard Alignment:  Levoscoliosis with apex at L4 Vertebrae:  No fracture, evidence of discitis, or bone lesion. Conus medullaris and cauda equina: Conus extends to the L1 level. Conus and cauda equina appear normal. Paraspinal and other soft tissues: Negative. Disc levels: L1-L2: Normal disc space and facet joints. No spinal canal stenosis. No neural foraminal stenosis. L2-L3: Unchanged small disc bulge. No spinal canal stenosis. Unchanged mild left neural foraminal stenosis. L3-L4: Unchanged intermediate sized disc bulge with endplate spurring. Right lateral recess narrowing without central spinal canal stenosis. Unchanged severe right and mild left neural foraminal stenosis. L4-L5: Unchanged intermediate sized  disc bulge with mild facet hypertrophy. Unchanged mild spinal canal stenosis. Unchanged severe right and mild left neural foraminal stenosis. L5-S1: Unchanged small disc bulge and mild facet hypertrophy. No spinal canal stenosis. Unchanged severe left neural foraminal stenosis. Visualized sacrum: Normal. IMPRESSION: 1. No epidural abscess or other acute abnormality of  the lumbar spine. 2. Unchanged mild L4-L5 spinal canal stenosis. 3. Unchanged multilevel severe neural foraminal stenosis, right L3-L4, right L4-L5 and left L5-S1. Electronically Signed   By: Ulyses Jarred M.D.   On: 06/13/2021 20:06   MR TIBIA FIBULA LEFT WO CONTRAST  Result Date: 06/14/2021 CLINICAL DATA:  Leg swelling and erythema with history of cellulitis for 2 months. Osteomyelitis suspected. EXAM: MRI OF LOWER LEFT EXTREMITY WITHOUT CONTRAST TECHNIQUE: Multiplanar, multisequence MR imaging of the left lower leg was performed. No intravenous contrast was administered. COMPARISON:  Radiographs 05/31/2021 and 05/13/2021. FINDINGS: Bones/Joint/Cartilage Images extend from the knee through the ankle. There is limited joint valuation due to edge of field distortion. The coronal images include the right lower leg. The left tibia and fibula appear normal without evidence of osteomyelitis. There is no evidence of acute fracture or dislocation. No large effusions are seen at the left knee or ankle. Ligaments Not relevant for exam/indication. Muscles and Tendons There is multifocal muscular edema, most prominent within the tibialis anterior muscle proximally. There is focal fat signal within the medial head of the left gastrocnemius muscle near the musculotendinous junction, probably related to prior injury. No focal intramuscular fluid collections. The visualized tendons appear intact. Soft tissues Mild diffuse subcutaneous edema within the left lower leg, greatest distally. Coronal images demonstrate milder soft tissue edema in the right lower leg.  IMPRESSION: 1. Nonspecific soft tissue edema in the lower legs, left greater than right, involving the subcutaneous fat and muscles. No focal fluid collections identified. 2. The muscular involvement could reflect myositis or subacute denervation. Suspected remote injury at the musculotendinous junction of the medial head of the left gastrocnemius muscle. 3. No evidence of left lower leg osteomyelitis. Electronically Signed   By: Richardean Sale M.D.   On: 06/14/2021 10:34   DG C-Arm 1-60 Min-No Report  Result Date: 06/21/2021 Fluoroscopy was utilized by the requesting physician.  No radiographic interpretation.   IR URETERAL STENT LEFT NEW ACCESS W/O SEP NEPHROSTOMY CATH  Result Date: 06/21/2021 INDICATION: 80 year old male with left-sided nephrolithiasis referred for percutaneous access for pending nephrolithotomy EXAM: IR URETURAL STENT LEFT NEW ACCESS W/O SEP NEPHROSTOMY CATH COMPARISON:  None. MEDICATIONS: 2 g Rocephin; The antibiotic was administered in an appropriate time frame prior to skin puncture. ANESTHESIA/SEDATION: Fentanyl 100 mcg IV; Versed 2.0 mg IV Moderate Sedation Time:  10 minutes The patient was continuously monitored during the procedure by the interventional radiology nurse under my direct supervision. CONTRAST:  50m OMNIPAQUE IOHEXOL 300 MG/ML SOLN - administered into the collecting system(s) FLUOROSCOPY TIME:  Fluoroscopy Time: 0 minutes 54 seconds (9 mGy). COMPLICATIONS: None PROCEDURE: Informed written consent was obtained from the patient after a thorough discussion of the procedural risks, benefits and alternatives. All questions were addressed. Maximal Sterile Barrier Technique was utilized including caps, mask, sterile gowns, sterile gloves, sterile drape, hand hygiene and skin antiseptic. A timeout was performed prior to the initiation of the procedure. The procedure, risks, benefits, and alternatives were explained to the patient, including the possibility of failure.  Questions regarding the procedure were encouraged and answered. The patient was positioned prone on the fluoroscopy table. The left flank was then prepped with chlorhexidine in the usual sterile fashion, and a sterile drape was applied covering the operative field. A sterile gown and sterile gloves were used for the procedure. Local anesthesia was provided with 1% Lidocaine. Scout images of the left flank were performed. Ultrasound was performed with images stored sent to PACs. The stone  within the collecting system was targeted with fluoroscopy. 1% lidocaine was used for local anesthesia. [place] needle was advanced under fluoroscopy targeting the stone in the collecting system. Once the needle was adjacent to the stone, contrast was infused for opacification of the collecting system. Stone existed within a lower pole, posterior calyx, thus this seemed a reasonable access point for placement of the nephroureteral drain. 018 wire was advanced into the collecting system. Wire was advanced beyond the collecting system into the ureter. Envi mini stick set was then advanced into the ureter and the inner dilator was removed. Contrast injection confirmed location in the ureter. Bentson wire was placed into the ureter, and advanced into the urinary bladder. Four French sheath was removed and a 5 [place] 65 cm Kumpe the catheter advanced into the ureter. The Kumpe the catheter was then navigated into the urinary bladder. Catheter was curled in the urinary bladder. Wire was removed. Final images were performed. Catheter was sutured in position. The patient tolerated procedure well and remained hemodynamically stable throughout. No complications were encountered and no significant blood loss was encountered. IMPRESSION: Status post image guided placement of left-sided nephroureteral drain, for pending nephrolithotomy in the operating room. Signed, Dulcy Fanny. Dellia Nims, RPVI [facility] Electronically Signed   By: Corrie Mckusick D.O.   On: 06/21/2021 10:19    Disposition: Discharge disposition: 01-Home or Self Care      Discharge Instructions     Discontinue IV   Complete by: As directed         Follow-up Information     [facility] Follow up.   Why: If not already scheduled, please call to arrange follow up for 2-3 weeks. Contact information: [address] [facility]                 Signed: Irine Seal 06/22/2021, 11:36 AM

## 2021-06-22 NOTE — Progress Notes (Addendum)
D/c'd foley per order @ 0510.  Clamped nephrostomy tube @ 0520.

## 2021-06-28 DIAGNOSIS — N200 Calculus of kidney: Secondary | ICD-10-CM | POA: Diagnosis not present

## 2021-07-02 ENCOUNTER — Ambulatory Visit: Payer: PPO | Admitting: Infectious Disease

## 2021-07-14 ENCOUNTER — Other Ambulatory Visit: Payer: Self-pay | Admitting: Internal Medicine

## 2021-07-15 ENCOUNTER — Ambulatory Visit: Payer: PPO | Admitting: *Deleted

## 2021-07-21 ENCOUNTER — Encounter: Payer: Self-pay | Admitting: Internal Medicine

## 2021-07-21 ENCOUNTER — Other Ambulatory Visit: Payer: Self-pay

## 2021-07-21 ENCOUNTER — Ambulatory Visit (INDEPENDENT_AMBULATORY_CARE_PROVIDER_SITE_OTHER): Payer: PPO | Admitting: Internal Medicine

## 2021-07-21 VITALS — BP 142/78 | HR 69 | Temp 98.2°F | Ht 72.0 in | Wt 184.2 lb

## 2021-07-21 DIAGNOSIS — M5442 Lumbago with sciatica, left side: Secondary | ICD-10-CM

## 2021-07-21 DIAGNOSIS — N200 Calculus of kidney: Secondary | ICD-10-CM | POA: Diagnosis not present

## 2021-07-21 DIAGNOSIS — L03119 Cellulitis of unspecified part of limb: Secondary | ICD-10-CM | POA: Diagnosis not present

## 2021-07-21 DIAGNOSIS — Z23 Encounter for immunization: Secondary | ICD-10-CM | POA: Diagnosis not present

## 2021-07-21 DIAGNOSIS — N1832 Chronic kidney disease, stage 3b: Secondary | ICD-10-CM | POA: Diagnosis not present

## 2021-07-21 DIAGNOSIS — G8929 Other chronic pain: Secondary | ICD-10-CM | POA: Diagnosis not present

## 2021-07-21 MED ORDER — MUPIROCIN 2 % EX OINT: TOPICAL_OINTMENT | CUTANEOUS | 0 refills | Status: DC

## 2021-07-21 NOTE — Assessment & Plan Note (Signed)
S/p ID eval - Dr Tommy Medal PO Doxy prn Use Bactroban prn

## 2021-07-21 NOTE — Assessment & Plan Note (Signed)
L radiculopathic pain Try TENS unit Tramadol prn

## 2021-07-21 NOTE — Assessment & Plan Note (Signed)
F/u Dr Vernie Shanks x6 stones/STENT on the L 9/22

## 2021-07-21 NOTE — Progress Notes (Signed)
Subjective:  Patient ID: Scott Oneal, male    DOB: December 18, 1940  Age: 80 y.o. MRN: [id number]  CC: Follow-up (3 month f/u)   HPI Scott Oneal presents for recurrent cellulitis L shin.  He continues to have pain and discoloration of the left shin. Follow-up on hypertension, hypothyroidism  Outpatient Medications Prior to Visit  Medication Sig Dispense Refill   calcitRIOL (ROCALTROL) 0.25 MCG capsule TAKE 1 CAPSULE BY MOUTH EVERY DAY 90 capsule 1   carvedilol (COREG) 25 MG tablet TAKE 1 TABLET BY MOUTH TWICE A DAY WITH MEALS 180 tablet 3   cetirizine (ZYRTEC) 10 MG tablet Take 10 mg by mouth daily as needed for allergies.     Cholecalciferol 2000 UNITS TABS Take 2,000 Units by mouth daily.     clonazePAM (KLONOPIN) 0.25 MG disintegrating tablet TAKE 1 TABLET (0.25 MG TOTAL) BY MOUTH 2 (TWO) TIMES DAILY AS NEEDED (ANXIETY). 60 tablet 3   diclofenac sodium (VOLTAREN) 1 % GEL APPLY 4 G TOPICALLY 4 TIMES DAILY. (Patient taking differently: Apply 1 application topically 4 (four) times daily as needed (pain).) 200 g 5   hydrALAZINE (APRESOLINE) 10 MG tablet Take 1-2 tablet 3 times a day as needed if blood pressure is >170 60 tablet 3   isosorbide mononitrate (IMDUR) 30 MG 24 hr tablet TAKE 1 TABLET BY MOUTH EVERY DAY 90 tablet 2   levothyroxine (SYNTHROID) 175 MCG tablet Take 1 tablet (175 mcg total) by mouth daily. 90 tablet 3   lipase/protease/amylase (CREON) 36000 UNITS CPEP capsule Take 2 capsules (72,000 Units total) by mouth 3 (three) times daily with meals. May also take 1 capsule (36,000 Units total) as needed (with snacks). 240 capsule 11   Multiple Vitamins-Minerals (PRESERVISION AREDS 2) CAPS Take 1 capsule by mouth in the morning and at bedtime.     nitroGLYCERIN (NITROSTAT) 0.4 MG SL tablet Place 0.4 mg under the tongue every 5 (five) minutes as needed for chest pain.     olmesartan (BENICAR) 5 MG tablet TAKE 1 TABLET BY MOUTH EVERY DAY 90 tablet 3   Propylene Glycol (SYSTANE  BALANCE) 0.6 % SOLN Place 1 drop into both eyes daily as needed (Dry eye). systane balance     simvastatin (ZOCOR) 10 MG tablet TAKE 1 TABLET BY MOUTH EVERYDAY AT BEDTIME 90 tablet 3   SYRINGE-NEEDLE, DISP, 3 ML (BD ECLIPSE SYRINGE) 23G X 1" 3 ML MISC As directed IM 50 each 2   tamsulosin (FLOMAX) 0.4 MG CAPS capsule Take 0.4 mg by mouth daily.     testosterone cypionate (DEPOTESTOSTERONE CYPIONATE) 200 MG/ML injection INJECT 2ML INTO MUSCLE EVERY 2 WEEKS (Patient taking differently: Inject 400 mg into the muscle every 14 (fourteen) days.) 12 mL 1   UNABLE TO FIND Med Name: [name] take 2 tablets by mouth daily     Ascorbic Acid (VITAMIN C PO) Take 1 tablet by mouth every other day. (Patient not taking: Reported on 07/21/2021)     aspirin 81 MG tablet Take 81 mg by mouth every evening.  (Patient not taking: Reported on 07/21/2021)     B Complex Vitamins (B COMPLEX PO) Take 1 tablet by mouth 3 (three) times daily. (Patient not taking: Reported on 07/21/2021)     Multiple Vitamin (MULTIVITAMIN) capsule Take 1 capsule by mouth daily. (Patient not taking: Reported on 07/21/2021)     oxyCODONE (OXY IR/ROXICODONE) 5 MG immediate release tablet Take 1 tablet (5 mg total) by mouth every 6 (six) hours as needed  for moderate pain or severe pain. (Patient not taking: Reported on 07/21/2021) 10 tablet 0   pantoprazole (PROTONIX) 40 MG tablet Take 1 tablet (40 mg total) by mouth daily. (Patient not taking: Reported on 07/21/2021) 30 tablet 11   saccharomyces boulardii (FLORASTOR) 250 MG capsule Take 1 capsule (250 mg total) by mouth 2 (two) times daily. (Patient not taking: Reported on 07/21/2021) 60 capsule 1   TURMERIC PO Take 1 capsule by mouth 3 (three) times daily. (Patient not taking: Reported on 07/21/2021)     No facility-administered medications prior to visit.    ROS: Review of Systems  Constitutional:  Negative for appetite change, fatigue, fever and unexpected weight change.  HENT:  Negative for  congestion, nosebleeds, sneezing, sore throat and trouble swallowing.   Eyes:  Negative for itching and visual disturbance.  Respiratory:  Negative for cough.   Cardiovascular:  Negative for chest pain, palpitations and leg swelling.  Gastrointestinal:  Negative for abdominal distention, blood in stool, diarrhea and nausea.  Genitourinary:  Negative for frequency and hematuria.  Musculoskeletal:  Negative for back pain, gait problem, joint swelling and neck pain.  Skin:  Positive for rash and wound.  Neurological:  Negative for dizziness, tremors, speech difficulty and weakness.  Psychiatric/Behavioral:  Negative for agitation, dysphoric mood and sleep disturbance. The patient is not nervous/anxious.    Objective:  BP (!) 142/78 (BP Location: Left Arm)   Pulse 69   Temp 98.2 F (36.8 C) (Oral)   Ht 6' (1.829 m)   Wt 184 lb 3.2 oz (83.6 kg)   SpO2 98%   BMI 24.98 kg/m   BP Readings from Last 3 Encounters:  07/21/21 (!) 142/78  06/22/21 (!) 154/91  06/21/21 128/75    Wt Readings from Last 3 Encounters:  07/21/21 184 lb 3.2 oz (83.6 kg)  06/21/21 188 lb 0.8 oz (85.3 kg)  06/18/21 188 lb (85.3 kg)    Physical Exam Constitutional:      General: He is not in acute distress.    Appearance: He is well-developed. He is obese.     Comments: NAD  Eyes:     Conjunctiva/sclera: Conjunctivae normal.     Pupils: Pupils are equal, round, and reactive to light.  Neck:     Thyroid: No thyromegaly.     Vascular: No JVD.  Cardiovascular:     Rate and Rhythm: Normal rate and regular rhythm.     Heart sounds: Normal heart sounds. No murmur heard.   No friction rub. No gallop.  Pulmonary:     Effort: Pulmonary effort is normal. No respiratory distress.     Breath sounds: Normal breath sounds. No wheezing or rales.  Chest:     Chest wall: No tenderness.  Abdominal:     General: Bowel sounds are normal. There is no distension.     Palpations: Abdomen is soft. There is no mass.      Tenderness: There is no abdominal tenderness. There is no guarding or rebound.  Musculoskeletal:        General: Tenderness present. Normal range of motion.     Cervical back: Normal range of motion.     Right lower leg: No edema.     Left lower leg: Edema present.  Lymphadenopathy:     Cervical: No cervical adenopathy.  Skin:    General: Skin is warm and dry.     Findings: No rash.  Neurological:     Mental Status: He is alert and oriented to  person, place, and time.     Cranial Nerves: No cranial nerve deficit.     Motor: No abnormal muscle tone.     Coordination: Coordination normal.     Gait: Gait normal.     Deep Tendon Reflexes: Reflexes are normal and symmetric.  Psychiatric:        Behavior: Behavior normal.        Thought Content: Thought content normal.        Judgment: Judgment normal.   L shin erythema, abrasions x2, scaly skin, trace edema   Lab Results  Component Value Date   WBC 4.6 06/21/2021   HGB 11.2 (L) 06/22/2021   HCT 36.0 (L) 06/22/2021   PLT 88 (L) 06/21/2021   GLUCOSE 131 (H) 06/22/2021   CHOL 115 02/18/2020   TRIG 115 02/18/2020   HDL 34 (L) 02/18/2020   LDLCALC 60 02/18/2020   ALT 25 05/31/2021   AST 31 05/31/2021   NA 140 06/22/2021   K 4.5 06/22/2021   CL 104 06/22/2021   CREATININE 2.69 (H) 06/22/2021   BUN 26 (H) 06/22/2021   CO2 27 06/22/2021   TSH 11.04 (H) 04/15/2021   PSA 2.08 08/24/2020   INR 1.1 06/21/2021   HGBA1C 5.9 03/15/2016   MICROALBUR 1.2 05/18/2017    DG C-Arm 1-60 Min-No Report  Result Date: 06/21/2021 Fluoroscopy was utilized by the requesting physician.  No radiographic interpretation.   IR URETERAL STENT LEFT NEW ACCESS W/O SEP NEPHROSTOMY CATH  Result Date: 06/21/2021 INDICATION: 80 year old male with left-sided nephrolithiasis referred for percutaneous access for pending nephrolithotomy EXAM: IR URETURAL STENT LEFT NEW ACCESS W/O SEP NEPHROSTOMY CATH COMPARISON:  None. MEDICATIONS: 2 g Rocephin; The  antibiotic was administered in an appropriate time frame prior to skin puncture. ANESTHESIA/SEDATION: Fentanyl 100 mcg IV; Versed 2.0 mg IV Moderate Sedation Time:  10 minutes The patient was continuously monitored during the procedure by the interventional radiology nurse under my direct supervision. CONTRAST:  10m OMNIPAQUE IOHEXOL 300 MG/ML SOLN - administered into the collecting system(s) FLUOROSCOPY TIME:  Fluoroscopy Time: 0 minutes 54 seconds (9 mGy). COMPLICATIONS: None PROCEDURE: Informed written consent was obtained from the patient after a thorough discussion of the procedural risks, benefits and alternatives. All questions were addressed. Maximal Sterile Barrier Technique was utilized including caps, mask, sterile gowns, sterile gloves, sterile drape, hand hygiene and skin antiseptic. A timeout was performed prior to the initiation of the procedure. The procedure, risks, benefits, and alternatives were explained to the patient, including the possibility of failure. Questions regarding the procedure were encouraged and answered. The patient was positioned prone on the fluoroscopy table. The left flank was then prepped with chlorhexidine in the usual sterile fashion, and a sterile drape was applied covering the operative field. A sterile gown and sterile gloves were used for the procedure. Local anesthesia was provided with 1% Lidocaine. Scout images of the left flank were performed. Ultrasound was performed with images stored sent to PACs. The stone within the collecting system was targeted with fluoroscopy. 1% lidocaine was used for local anesthesia. 2[place] was advanced under fluoroscopy targeting the stone in the collecting system. Once the needle was adjacent to the stone, contrast was infused for opacification of the collecting system. Stone existed within a lower pole, posterior calyx, thus this seemed a reasonable access point for placement of the nephroureteral drain. 018 wire was  advanced into the collecting system. Wire was advanced beyond the collecting system into the ureter. Envi mini  stick set was then advanced into the ureter and the inner dilator was removed. Contrast injection confirmed location in the ureter. Bentson wire was placed into the ureter, and advanced into the urinary bladder. Four French sheath was removed and a 5 [place] 65 cm Kumpe the catheter advanced into the ureter. The Kumpe the catheter was then navigated into the urinary bladder. Catheter was curled in the urinary bladder. Wire was removed. Final images were performed. Catheter was sutured in position. The patient tolerated procedure well and remained hemodynamically stable throughout. No complications were encountered and no significant blood loss was encountered. IMPRESSION: Status post image guided placement of left-sided nephroureteral drain, for pending nephrolithotomy in the operating room. Signed, Dulcy Fanny. Dellia Nims, RPVI [facility] Electronically Signed   By: Corrie Mckusick D.O.   On: 06/21/2021 10:19    Assessment & Plan:   Problem List Items Addressed This Visit     Cellulitis of lower leg    S/p ID eval - Dr Tommy Medal PO Doxy prn Use Bactroban prn      CKD (chronic kidney disease) stage 3, GFR 30-59 ml/min (HCC)    Monitor GFR Hydrate well      Kidney stone    S/p recent removal      Low back pain    L radiculopathic pain Try TENS unit Tramadol prn      Nephrolithiasis    F/u Dr Vernie Shanks x6 stones/STENT on the L 9/22      Other Visit Diagnoses     Needs flu shot    -  Primary   Relevant Orders   Flu Vaccine QUAD High Dose(Fluad) (Completed)         Follow-up: Return in about 3 months (around 10/20/2021) for a follow-up visit.  Walker Kehr, MD

## 2021-07-21 NOTE — Assessment & Plan Note (Signed)
Monitor GFR Hydrate well

## 2021-07-21 NOTE — Assessment & Plan Note (Signed)
S/p recent removal

## 2021-08-03 ENCOUNTER — Ambulatory Visit: Payer: PPO | Admitting: Internal Medicine

## 2021-08-10 DIAGNOSIS — I129 Hypertensive chronic kidney disease with stage 1 through stage 4 chronic kidney disease, or unspecified chronic kidney disease: Secondary | ICD-10-CM | POA: Diagnosis not present

## 2021-08-10 DIAGNOSIS — R319 Hematuria, unspecified: Secondary | ICD-10-CM | POA: Diagnosis not present

## 2021-08-10 DIAGNOSIS — Z6841 Body Mass Index (BMI) 40.0 and over, adult: Secondary | ICD-10-CM | POA: Diagnosis not present

## 2021-08-10 DIAGNOSIS — N320 Bladder-neck obstruction: Secondary | ICD-10-CM | POA: Diagnosis not present

## 2021-08-10 DIAGNOSIS — N183 Chronic kidney disease, stage 3 unspecified: Secondary | ICD-10-CM | POA: Diagnosis not present

## 2021-08-11 ENCOUNTER — Telehealth: Payer: Self-pay | Admitting: Cardiovascular Disease

## 2021-08-11 NOTE — Telephone Encounter (Signed)
Spoke with pt and pt noted weakness and chills 2 nights ago this has resolved now just notes fatigue and pt feels this is coming from B/P readings see readings in message.Per pt recently was seen by Dr Moshe Cipro and Dr Moshe Cipro was going to  touch base with Dr Burt Knack  about  maybe decreasing  Carvedilol  per pt B/P yesterday was 120 or 122/60 and HR running 55-65 Will forward to Dr Burt Knack for review and recommendations ./cy

## 2021-08-11 NOTE — Telephone Encounter (Signed)
Pt returning phone call... please advise  

## 2021-08-11 NOTE — Telephone Encounter (Signed)
Pt aware of recommendations and verbalizes understanding ./cy

## 2021-08-11 NOTE — Telephone Encounter (Signed)
Pt c/o BP issue: STAT if pt c/o blurred vision, one-sided weakness or slurred speech  1. What are your last 5 BP readings?  118/59 120/60 112/59 122/68  2. Are you having any other symptoms (ex. Dizziness, headache, blurred vision, passed out)? Tired, chills, feels weak.  3. What is your BP issue? Low BP

## 2021-08-11 NOTE — Telephone Encounter (Signed)
Would be ok to reduce coreg to 12.5 mg BID. If symptoms continue, should get back with PCP. The BP meds obviously could be contributing to low BP, but wouldn't think they cause chills. If related to the coreg, should feel better in 3-4 days. thanks

## 2021-08-11 NOTE — Telephone Encounter (Signed)
Lm to call back ./cy 

## 2021-08-16 ENCOUNTER — Ambulatory Visit: Payer: PPO | Admitting: Internal Medicine

## 2021-08-23 DIAGNOSIS — N200 Calculus of kidney: Secondary | ICD-10-CM | POA: Diagnosis not present

## 2021-09-08 ENCOUNTER — Ambulatory Visit (INDEPENDENT_AMBULATORY_CARE_PROVIDER_SITE_OTHER): Payer: PPO | Admitting: Infectious Disease

## 2021-09-08 ENCOUNTER — Encounter: Payer: Self-pay | Admitting: Infectious Disease

## 2021-09-08 ENCOUNTER — Other Ambulatory Visit: Payer: Self-pay

## 2021-09-08 VITALS — BP 144/89 | HR 58 | Resp 16 | Ht 72.0 in | Wt 186.6 lb

## 2021-09-08 DIAGNOSIS — Z7185 Encounter for immunization safety counseling: Secondary | ICD-10-CM

## 2021-09-08 DIAGNOSIS — L270 Generalized skin eruption due to drugs and medicaments taken internally: Secondary | ICD-10-CM

## 2021-09-08 DIAGNOSIS — K8689 Other specified diseases of pancreas: Secondary | ICD-10-CM

## 2021-09-08 DIAGNOSIS — I872 Venous insufficiency (chronic) (peripheral): Secondary | ICD-10-CM | POA: Diagnosis not present

## 2021-09-08 DIAGNOSIS — Z872 Personal history of diseases of the skin and subcutaneous tissue: Secondary | ICD-10-CM | POA: Diagnosis not present

## 2021-09-08 DIAGNOSIS — L539 Erythematous condition, unspecified: Secondary | ICD-10-CM | POA: Diagnosis not present

## 2021-09-08 DIAGNOSIS — N183 Chronic kidney disease, stage 3 unspecified: Secondary | ICD-10-CM | POA: Diagnosis not present

## 2021-09-08 DIAGNOSIS — R29898 Other symptoms and signs involving the musculoskeletal system: Secondary | ICD-10-CM

## 2021-09-08 HISTORY — DX: Erythematous condition, unspecified: L53.9

## 2021-09-08 HISTORY — DX: Venous insufficiency (chronic) (peripheral): I87.2

## 2021-09-08 HISTORY — DX: Personal history of diseases of the skin and subcutaneous tissue: Z87.2

## 2021-09-08 MED ORDER — CEFADROXIL 500 MG PO CAPS: 1000.0000 mg | ORAL_CAPSULE | Freq: Two times a day (BID) | ORAL | 0 refills | Status: DC

## 2021-09-08 NOTE — Progress Notes (Signed)
Subjective:   Chief complaint: Scott Oneal has yet again injured his left lower shin and had what he believes was cellulitis      Patient ID: BARI HANDSHOE, male    DOB: Nov 12, 1940, 80 y.o.   MRN: [id number]  [place] is a 80 year old Caucasian man with multiple medical problems including chronic diarrhea that sounds as if it is due to pancreatic insufficiency as he says it is improved now that he began pancreatic enzyme replacement.  He also has coronary artery disease and a history of cholelithiasis and also a lifelong history of kidney stones.  Scott Oneal told me that he first developed problems with his left lower extremity this June.  He had been working outside extensively in the yard and came back in and noticed erythema and swelling of his leg.  He lay back in recliner and fell asleep that evening.  The next day when he attempted to get out of bed he was too weak to get out of the recliner.  He was ultimately hospitalized at [facility] and stayed there for 4 days.  I did not have access to his medical records but he shows me a photograph of one of the antibiotics he was given as an inpatient which was in fact vancomycin.  He was ultimately discharged on doxycycline and Augmentin and saw his primary care physician Dr. Alain Marion on April 05, 2021.  According to Dr. Alain Marion the patient had relapse of his left lower extremity redness with pain and swelling.  [place]  [name] tried to acquire Elesa Hacker for the patient but it was cost-prohibitive.   Doxycycline was given in the interim.  He was referred to [place] in infectious disease.  He told me that he did not have much in the improvement in the way of his erythema while on the doxycycline.  He then developed a rash all over his forearms and his face that was highly consistent with a sun exposure rash on doxycycline at his visit is quite painful and quite erythematous in the sun exposed  regions.  His diarrhea improved while he has been on his pancreatic replacement enzymes.  In questioning him he also mentioned history of his left leg giving out from underneath him at times.  He has had an MRI of the lumbar spine this spring but not since he began having the symptoms of his leg giving out from underneath him as he describes them to me today.  His left lower extremity did haves ome erythema and is quite tender in some of the areas around this.  He did not show evidence of systemic infection with fevers chills nausea vomiting or anorexia.   I ordered plain films of his leg which I personally reviewed show no evidence of osteomyelitis.  Films of the spine are also ordered and is reviewed personally and they show degenerative disc disease and facet hypertrophy in the lumbar spine with bilateral kidney stones.  Was able to review records from the hospital in [place] where he was hospitalized he was indeed discharged on Augmentin after having been on vancomycin.  I did not see blood culture data when I look through the records that were faxed to's to [place] in talking to him today does not sound like blood cultures were drawn.  After his first visit he was initially scheduled to to see me in several weeks time but was concerned over the weekend prior to the last  visit when there was an area on the posterior of his leg that became more erythematous.  He was  still bothered also on some about some areas on the anterior the legs that are still erythematous.  I noted these areas do not blanch and I suspect they may be potential damaged minor blood vessels.  In the interim I ordered an MRI of his leg which came back and was personally reviewed by me and does not show any evidence of osteomyelitis or pyomyositis.  He also again had a history of his leg giving out from under him and I ordered a repeat MRI of his lumbar spine.  I have also personally reviewed them and agree that there is  no evidence of an epidural abscess and there is still L4-L5 spinal canal stenosis which is no change as well as some severe neural neural foraminal stenosis on the right L3-4 and L4-5 L5-S1  He return ed to clinic again roughly a week since last last saw him and he has not had any worsening of his erythema in his leg he has an area that he believes is new where he had struck his leg against an object.  He still has tenderness throughout his left leg and has bilateral edema.  I last saw him in August 2022  Since I last saw him he had been sitting in his couch and had an itch in his left shin and scratched it with his right foot with a sock.  This caused skin to come off and this area became erythematous.  He then initiated amoxicillin.  See pictures below:     Erythema resolved during 7 days of amoxicillin therapy.  He did develop a rash at the origins of therapy and believes he now may be allergic to amoxicillin.  He did not have chills fevers nausea malaise or other systemic symptoms.   Past Medical History:  Diagnosis Date   Allergy    Anxiety    Arthritis    Asthma    Benign neoplasm of colon    Calculus of gallbladder without mention of cholecystitis    Cataract    bilateral cateracts removed   Celiac disease    Cholelithiasis    Chronic kidney disease    Coronary atherosclerosis of unspecified type of vessel, native or graft    Dermatitis 06/08/2021   Diarrhea    Esophageal reflux    Glaucoma    Gout, unspecified    Hyperparathyroidism    Long term (current) use of anticoagulants    Loss of weight    Lower extremity weakness 05/31/2021   Macular degeneration    Old myocardial infarction    Osteoporosis, unspecified    Other malaise and fatigue    Other specified cardiac dysrhythmias(427.89)    Other testicular hypofunction    Pancreatic insufficiency 05/31/2021   Personal history of colonic polyps    Pure hypercholesterolemia    Thrombocytopenia (HCC)     chronic, per PCP notes dating back to 2009   Tinea pedis 05/31/2021   Unspecified asthma(493.90)    Unspecified essential hypertension    Unspecified hypothyroidism     Past Surgical History:  Procedure Laterality Date   cataract surgery Bilateral    COLONOSCOPY  06/26/2017   [facility] Plan   COLONOSCOPY WITH PROPOFOL N/A 11/27/2014   Procedure: COLONOSCOPY WITH PROPOFOL;  Surgeon: Milus Banister, MD;  Location: [place];  Service: Endoscopy;  Laterality: N/A;   CORONARY ANGIOPLASTY     CORONARY  STENT PLACEMENT  2011   Cypher; in distal circumflex artery   CYSTOSCOPY  1998   EYE SURGERY     IR URETERAL STENT LEFT NEW ACCESS W/O SEP NEPHROSTOMY CATH  06/21/2021   KNEE SURGERY     right   LEFT HEART CATH AND CORONARY ANGIOGRAPHY N/A 01/31/2020   Procedure: LEFT HEART CATH AND CORONARY ANGIOGRAPHY;  Surgeon: Belva Crome, MD;  Location: [facility];  Service: Cardiovascular;  Laterality: N/A;   NEPHROLITHOTOMY Left 06/21/2021   Procedure: NEPHROLITHOTOMY PERCUTANEOUS;  Surgeon: Franchot Gallo, MD;  Location: [facility];  Service: Urology;  Laterality: Left;  28 MINS   SHOULDER SURGERY Left 9/14   Dr Shara Blazing   UPPER GASTROINTESTINAL ENDOSCOPY     VASCULAR SURGERY      Family History  Problem Relation Age of Onset   Lymphoma Father    Hypertension Other    Vision loss Maternal Grandmother    Colon cancer Son 28   Asthma Neg Hx    Esophageal cancer Neg Hx    Rectal cancer Neg Hx    Stomach cancer Neg Hx    Colon polyps Neg Hx       Social History   Socioeconomic History   Marital status: Married    Spouse name: Kyreese Chio   Number of children: 3   Years of education: Not on file   Highest education level: Not on file  Occupational History   Occupation: retired    Fish farm manager: RETIRED    Comment: Engineering  Tobacco Use   Smoking status: Never   Smokeless tobacco: Never  Vaping Use   Vaping Use: Never used  Substance and Sexual Activity   Alcohol use: No    Drug use: No   Sexual activity: Yes  Other Topics Concern   Not on file  Social History Narrative   Retired.   Regular Exercise-Yes; yoga & silver sneakers   Daily Caffeine Use.            Social Determinants of Health   Financial Resource Strain: Low Risk    Difficulty of Paying Living Expenses: Not hard at all  Food Insecurity: No Food Insecurity   Worried About Charity fundraiser in the Last Year: Never true   [place] in the Last Year: Never true  Transportation Needs: No Transportation Needs   Lack of Transportation (Medical): No   Lack of Transportation (Non-Medical): No  Physical Activity: Sufficiently Active   Days of Exercise per Week: 5 days   Minutes of Exercise per Session: 30 min  Stress: No Stress Concern Present   Feeling of Stress : Not at all  Social Connections: Socially Integrated   Frequency of Communication with Friends and Family: More than three times a week   Frequency of Social Gatherings with Friends and Family: More than three times a week   Attends Religious Services: More than 4 times per year   Active Member of [employer] or Organizations: Yes   Attends Music therapist: More than 4 times per year   Marital Status: Married    Allergies  Allergen Reactions   Biaxin [Clarithromycin]     Dizziness   Tizanidine     Felt funny   Allantoin-Pramoxine Other (See Comments)    Pt does't remember reaction   Amlodipine     Edema    Benzalkonium Chloride     Other reaction(s): mild rash/itching   Doxycycline Photosensitivity    rash  Metoprolol Tartrate Other (See Comments)    REACTION: fatigue     Current Outpatient Medications:    calcitRIOL (ROCALTROL) 0.25 MCG capsule, TAKE 1 CAPSULE BY MOUTH EVERY DAY, Disp: 90 capsule, Rfl: 1   carvedilol (COREG) 25 MG tablet, TAKE 1 TABLET BY MOUTH TWICE A DAY WITH MEALS, Disp: 180 tablet, Rfl: 3   cetirizine (ZYRTEC) 10 MG tablet, Take 10 mg by mouth daily as needed for allergies.,  Disp: , Rfl:    Cholecalciferol 2000 UNITS TABS, Take 2,000 Units by mouth daily., Disp: , Rfl:    clonazePAM (KLONOPIN) 0.25 MG disintegrating tablet, TAKE 1 TABLET (0.25 MG TOTAL) BY MOUTH 2 (TWO) TIMES DAILY AS NEEDED (ANXIETY)., Disp: 60 tablet, Rfl: 3   diclofenac sodium (VOLTAREN) 1 % GEL, APPLY 4 G TOPICALLY 4 TIMES DAILY. (Patient taking differently: Apply 1 application topically 4 (four) times daily as needed (pain).), Disp: 200 g, Rfl: 5   hydrALAZINE (APRESOLINE) 10 MG tablet, Take 1-2 tablet 3 times a day as needed if blood pressure is >170, Disp: 60 tablet, Rfl: 3   isosorbide mononitrate (IMDUR) 30 MG 24 hr tablet, TAKE 1 TABLET BY MOUTH EVERY DAY, Disp: 90 tablet, Rfl: 2   levothyroxine (SYNTHROID) 175 MCG tablet, Take 1 tablet (175 mcg total) by mouth daily., Disp: 90 tablet, Rfl: 3   lipase/protease/amylase (CREON) 36000 UNITS CPEP capsule, Take 2 capsules (72,000 Units total) by mouth 3 (three) times daily with meals. May also take 1 capsule (36,000 Units total) as needed (with snacks)., Disp: 240 capsule, Rfl: 11   Multiple Vitamins-Minerals (PRESERVISION AREDS 2) CAPS, Take 1 capsule by mouth in the morning and at bedtime., Disp: , Rfl:    mupirocin ointment (BACTROBAN) 2 %, On leg wound w/dressing change qd or bid, Disp: 30 g, Rfl: 0   nitroGLYCERIN (NITROSTAT) 0.4 MG SL tablet, Place 0.4 mg under the tongue every 5 (five) minutes as needed for chest pain., Disp: , Rfl:    olmesartan (BENICAR) 5 MG tablet, TAKE 1 TABLET BY MOUTH EVERY DAY, Disp: 90 tablet, Rfl: 3   Propylene Glycol (SYSTANE BALANCE) 0.6 % SOLN, Place 1 drop into both eyes daily as needed (Dry eye). systane balance, Disp: , Rfl:    simvastatin (ZOCOR) 10 MG tablet, TAKE 1 TABLET BY MOUTH EVERYDAY AT BEDTIME, Disp: 90 tablet, Rfl: 3   SYRINGE-NEEDLE, DISP, 3 ML (BD ECLIPSE SYRINGE) 23G X 1" 3 ML MISC, As directed IM, Disp: 50 each, Rfl: 2   tamsulosin (FLOMAX) 0.4 MG CAPS capsule, Take 0.4 mg by mouth daily., Disp:  , Rfl:    testosterone cypionate (DEPOTESTOSTERONE CYPIONATE) 200 MG/ML injection, INJECT 2ML INTO MUSCLE EVERY 2 WEEKS (Patient taking differently: Inject 400 mg into the muscle every 14 (fourteen) days.), Disp: 12 mL, Rfl: 1   UNABLE TO FIND, Med Name: [name] take 2 tablets by mouth daily, Disp: , Rfl:    Review of Systems  Constitutional:  Negative for activity change, appetite change, chills, diaphoresis, fatigue, fever and unexpected weight change.  HENT:  Negative for congestion, rhinorrhea, sinus pressure, sneezing, sore throat and trouble swallowing.   Eyes:  Negative for photophobia and visual disturbance.  Respiratory:  Negative for cough, chest tightness, shortness of breath, wheezing and stridor.   Cardiovascular:  Negative for chest pain, palpitations and leg swelling.  Gastrointestinal:  Negative for abdominal distention, abdominal pain, anal bleeding, blood in stool, constipation, diarrhea, nausea and vomiting.  Genitourinary:  Negative for difficulty urinating, dysuria, flank pain and  hematuria.  Musculoskeletal:  Negative for arthralgias, back pain, gait problem, joint swelling and myalgias.  Skin:  Positive for color change and wound. Negative for pallor and rash.  Neurological:  Negative for dizziness, tremors, weakness and light-headedness.  Hematological:  Negative for adenopathy. Does not bruise/bleed easily.  Psychiatric/Behavioral:  Negative for agitation, behavioral problems, confusion, decreased concentration, dysphoric mood and sleep disturbance.       Objective:   Physical Exam Constitutional:      Appearance: He is well-developed.  HENT:     Head: Normocephalic and atraumatic.  Eyes:     Conjunctiva/sclera: Conjunctivae normal.  Cardiovascular:     Rate and Rhythm: Normal rate and regular rhythm.  Pulmonary:     Effort: Pulmonary effort is normal. No respiratory distress.     Breath sounds: No wheezing.  Abdominal:     General: There is no distension.      Palpations: Abdomen is soft.  Musculoskeletal:        General: No tenderness. Normal range of motion.     Cervical back: Normal range of motion and neck supple.  Skin:    General: Skin is warm and dry.     Coloration: Skin is not pale.     Findings: No erythema or rash.  Neurological:     General: No focal deficit present.     Mental Status: He is alert and oriented to person, place, and time.  Psychiatric:        Mood and Affect: Mood normal.        Behavior: Behavior normal.        Thought Content: Thought content normal.        Judgment: Judgment normal.    Left leg  05/31/2021: he is tender near the area where there was bleeding    Leg 06/08/2021: Continued to be tender to palpation of the skin in this leg.    Left leg 06/18/2021:      Left leg today 09/08/2021 scab pictured and venous stasis changes.           Assessment & Plan:   History of cellulitis, possibly recurrent:  Recent episode may have been a mild episode of cellulitis but also might of resolved with topical antibiotics as some similar episodes have in the past.  I am changing his as needed antibiotic over to cefadroxil since he may have had allergy to amoxicillin.  Possible allergic reaction to amoxicillin with rash have updated his allergies.  Should have lower extremity weakness: See prior notes and work-up that was performed.  Doxycycline induced rash this is resolved as well.  Vaccine counseling he has had his influenza vaccine for the season and has had an updated COVID 19 shot

## 2021-10-07 ENCOUNTER — Other Ambulatory Visit: Payer: Self-pay | Admitting: Internal Medicine

## 2021-10-14 ENCOUNTER — Other Ambulatory Visit: Payer: Self-pay

## 2021-10-14 ENCOUNTER — Encounter: Payer: Self-pay | Admitting: Internal Medicine

## 2021-10-14 ENCOUNTER — Ambulatory Visit (INDEPENDENT_AMBULATORY_CARE_PROVIDER_SITE_OTHER): Payer: PPO | Admitting: Internal Medicine

## 2021-10-14 DIAGNOSIS — L03119 Cellulitis of unspecified part of limb: Secondary | ICD-10-CM

## 2021-10-14 DIAGNOSIS — M5442 Lumbago with sciatica, left side: Secondary | ICD-10-CM | POA: Diagnosis not present

## 2021-10-14 DIAGNOSIS — G4700 Insomnia, unspecified: Secondary | ICD-10-CM

## 2021-10-14 DIAGNOSIS — R197 Diarrhea, unspecified: Secondary | ICD-10-CM

## 2021-10-14 DIAGNOSIS — G8929 Other chronic pain: Secondary | ICD-10-CM

## 2021-10-14 NOTE — Assessment & Plan Note (Addendum)
No relapse 

## 2021-10-14 NOTE — Assessment & Plan Note (Signed)
Pancreatic insufficiency related - Dr Fuller Plan On Creon  - $$$$ Much better

## 2021-10-14 NOTE — Assessment & Plan Note (Signed)
Worse Clonazepam at hs qd or  qod  Potential benefits of a long term benzodiazepines  use as well as potential risks  and complications were explained to the patient and were aknowledged.

## 2021-10-14 NOTE — Progress Notes (Signed)
Subjective:  Patient ID: FRENCH KENDRA, male    DOB: Jun 29, 1941  Age: 80 y.o. MRN: [id number]  CC: Follow-up (3 month f/u)    HPI Zyquan W Dupre presents for CKD, leg cellulitis, LBP C/o insomnia - worse  Outpatient Medications Prior to Visit  Medication Sig Dispense Refill   carvedilol (COREG) 25 MG tablet TAKE 1 TABLET BY MOUTH TWICE A DAY WITH MEALS 180 tablet 3   cetirizine (ZYRTEC) 10 MG tablet Take 10 mg by mouth daily as needed for allergies.     Cholecalciferol 2000 UNITS TABS Take 2,000 Units by mouth daily.     clonazePAM (KLONOPIN) 0.25 MG disintegrating tablet TAKE 1 TABLET (0.25 MG TOTAL) BY MOUTH 2 (TWO) TIMES DAILY AS NEEDED (ANXIETY). 60 tablet 3   diclofenac sodium (VOLTAREN) 1 % GEL APPLY 4 G TOPICALLY 4 TIMES DAILY. (Patient taking differently: Apply 1 application topically 4 (four) times daily as needed (pain).) 200 g 5   hydrALAZINE (APRESOLINE) 10 MG tablet Take 1-2 tablet 3 times a day as needed if blood pressure is >170 60 tablet 3   levothyroxine (SYNTHROID) 175 MCG tablet Take 1 tablet (175 mcg total) by mouth daily. 90 tablet 3   lipase/protease/amylase (CREON) 36000 UNITS CPEP capsule Take 2 capsules (72,000 Units total) by mouth 3 (three) times daily with meals. May also take 1 capsule (36,000 Units total) as needed (with snacks). 240 capsule 11   Multiple Vitamins-Minerals (PRESERVISION AREDS 2) CAPS Take 1 capsule by mouth in the morning and at bedtime.     nitroGLYCERIN (NITROSTAT) 0.4 MG SL tablet Place 0.4 mg under the tongue every 5 (five) minutes as needed for chest pain.     olmesartan (BENICAR) 5 MG tablet TAKE 1 TABLET BY MOUTH EVERY DAY 90 tablet 3   Propylene Glycol (SYSTANE BALANCE) 0.6 % SOLN Place 1 drop into both eyes daily as needed (Dry eye). systane balance     simvastatin (ZOCOR) 10 MG tablet TAKE 1 TABLET BY MOUTH EVERYDAY AT BEDTIME 90 tablet 3   SYRINGE-NEEDLE, DISP, 3 ML (BD ECLIPSE SYRINGE) 23G X 1" 3 ML MISC As directed IM 50 each 2    tamsulosin (FLOMAX) 0.4 MG CAPS capsule Take 0.4 mg by mouth daily.     testosterone cypionate (DEPOTESTOSTERONE CYPIONATE) 200 MG/ML injection INJECT 2ML INTO MUSCLE EVERY 2 WEEKS 12 mL 1   UNABLE TO FIND Med Name: [name] take 2 tablets by mouth daily     calcitRIOL (ROCALTROL) 0.25 MCG capsule TAKE 1 CAPSULE BY MOUTH EVERY DAY 90 capsule 1   isosorbide mononitrate (IMDUR) 30 MG 24 hr tablet TAKE 1 TABLET BY MOUTH EVERY DAY 90 tablet 2   mupirocin ointment (BACTROBAN) 2 % On leg wound w/dressing change qd or bid (Patient not taking: Reported on 10/14/2021) 30 g 0   No facility-administered medications prior to visit.    ROS: Review of Systems  Constitutional:  Positive for fatigue. Negative for appetite change and unexpected weight change.  HENT:  Negative for congestion, nosebleeds, sneezing, sore throat and trouble swallowing.   Eyes:  Negative for itching and visual disturbance.  Respiratory:  Negative for cough.   Cardiovascular:  Negative for chest pain, palpitations and leg swelling.  Gastrointestinal:  Negative for abdominal distention, blood in stool, diarrhea and nausea.  Genitourinary:  Negative for frequency and hematuria.  Musculoskeletal:  Positive for gait problem. Negative for back pain, joint swelling and neck pain.  Skin:  Negative for rash.  Neurological:  Negative for dizziness, tremors, speech difficulty and weakness.  Psychiatric/Behavioral:  Positive for sleep disturbance. Negative for agitation, dysphoric mood and suicidal ideas. The patient is not nervous/anxious.    Objective:  BP 120/72 (BP Location: Left Arm)    Pulse 77    Temp 98.2 F (36.8 C) (Oral)    Ht 6' (1.829 m)    Wt 187 lb 12.8 oz (85.2 kg)    SpO2 97%    BMI 25.47 kg/m   BP Readings from Last 3 Encounters:  10/14/21 120/72  09/08/21 (!) 144/89  07/21/21 (!) 142/78    Wt Readings from Last 3 Encounters:  10/14/21 187 lb 12.8 oz (85.2 kg)  09/08/21 186 lb 9.6 oz (84.6 kg)  07/21/21 184  lb 3.2 oz (83.6 kg)    Physical Exam Constitutional:      General: He is not in acute distress.    Appearance: Normal appearance. He is well-developed.     Comments: NAD  Eyes:     Conjunctiva/sclera: Conjunctivae normal.     Pupils: Pupils are equal, round, and reactive to light.  Neck:     Thyroid: No thyromegaly.     Vascular: No JVD.  Cardiovascular:     Rate and Rhythm: Normal rate and regular rhythm.     Heart sounds: Normal heart sounds. No murmur heard.   No friction rub. No gallop.  Pulmonary:     Effort: Pulmonary effort is normal. No respiratory distress.     Breath sounds: Normal breath sounds. No wheezing or rales.  Chest:     Chest wall: No tenderness.  Abdominal:     General: Bowel sounds are normal. There is no distension.     Palpations: Abdomen is soft. There is no mass.     Tenderness: There is no abdominal tenderness. There is no guarding or rebound.  Musculoskeletal:        General: Tenderness present. Normal range of motion.     Cervical back: Normal range of motion.  Lymphadenopathy:     Cervical: No cervical adenopathy.  Skin:    General: Skin is warm and dry.     Findings: No rash.  Neurological:     Mental Status: He is alert and oriented to person, place, and time.     Cranial Nerves: No cranial nerve deficit.     Motor: No abnormal muscle tone.     Coordination: Coordination normal.     Gait: Gait abnormal.     Deep Tendon Reflexes: Reflexes are normal and symmetric.  Psychiatric:        Behavior: Behavior normal.        Thought Content: Thought content normal.        Judgment: Judgment normal.  Slightely ataxic  Lab Results  Component Value Date   WBC 4.6 06/21/2021   HGB 11.2 (L) 06/22/2021   HCT 36.0 (L) 06/22/2021   PLT 88 (L) 06/21/2021   GLUCOSE 131 (H) 06/22/2021   CHOL 115 02/18/2020   TRIG 115 02/18/2020   HDL 34 (L) 02/18/2020   LDLCALC 60 02/18/2020   ALT 25 05/31/2021   AST 31 05/31/2021   NA 140 06/22/2021   K 4.5  06/22/2021   CL 104 06/22/2021   CREATININE 2.69 (H) 06/22/2021   BUN 26 (H) 06/22/2021   CO2 27 06/22/2021   TSH 11.04 (H) 04/15/2021   PSA 2.08 08/24/2020   INR 1.1 06/21/2021   HGBA1C 5.9 03/15/2016   MICROALBUR 1.2 05/18/2017  DG C-Arm 1-60 Min-No Report  Result Date: 06/21/2021 Fluoroscopy was utilized by the requesting physician.  No radiographic interpretation.   IR URETERAL STENT LEFT NEW ACCESS W/O SEP NEPHROSTOMY CATH  Result Date: 06/21/2021 INDICATION: 80 year old male with left-sided nephrolithiasis referred for percutaneous access for pending nephrolithotomy EXAM: IR URETURAL STENT LEFT NEW ACCESS W/O SEP NEPHROSTOMY CATH COMPARISON:  None. MEDICATIONS: 2 g Rocephin; The antibiotic was administered in an appropriate time frame prior to skin puncture. ANESTHESIA/SEDATION: Fentanyl 100 mcg IV; Versed 2.0 mg IV Moderate Sedation Time:  10 minutes The patient was continuously monitored during the procedure by the interventional radiology nurse under my direct supervision. CONTRAST:  1m OMNIPAQUE IOHEXOL 300 MG/ML SOLN - administered into the collecting system(s) FLUOROSCOPY TIME:  Fluoroscopy Time: 0 minutes 54 seconds (9 mGy). COMPLICATIONS: None PROCEDURE: Informed written consent was obtained from the patient after a thorough discussion of the procedural risks, benefits and alternatives. All questions were addressed. Maximal Sterile Barrier Technique was utilized including caps, mask, sterile gowns, sterile gloves, sterile drape, hand hygiene and skin antiseptic. A timeout was performed prior to the initiation of the procedure. The procedure, risks, benefits, and alternatives were explained to the patient, including the possibility of failure. Questions regarding the procedure were encouraged and answered. The patient was positioned prone on the fluoroscopy table. The left flank was then prepped with chlorhexidine in the usual sterile fashion, and a sterile drape was applied  covering the operative field. A sterile gown and sterile gloves were used for the procedure. Local anesthesia was provided with 1% Lidocaine. Scout images of the left flank were performed. Ultrasound was performed with images stored sent to PACs. The stone within the collecting system was targeted with fluoroscopy. 1% lidocaine was used for local anesthesia. 2Ho-Ho-Kusneedle was advanced under fluoroscopy targeting the stone in the collecting system. Once the needle was adjacent to the stone, contrast was infused for opacification of the collecting system. Stone existed within a lower pole, posterior calyx, thus this seemed a reasonable access point for placement of the nephroureteral drain. 018 wire was advanced into the collecting system. Wire was advanced beyond the collecting system into the ureter. Envi mini stick set was then advanced into the ureter and the inner dilator was removed. Contrast injection confirmed location in the ureter. Bentson wire was placed into the ureter, and advanced into the urinary bladder. Four French sheath was removed and a 5 [place]65 cm Kumpe the catheter advanced into the ureter. The Kumpe the catheter was then navigated into the urinary bladder. Catheter was curled in the urinary bladder. Wire was removed. Final images were performed. Catheter was sutured in position. The patient tolerated procedure well and remained hemodynamically stable throughout. No complications were encountered and no significant blood loss was encountered. IMPRESSION: Status post image guided placement of left-sided nephroureteral drain, for pending nephrolithotomy in the operating room. Signed, JDulcy Fanny WDellia Nims RPVI [facility] GThe [facility] Center[facility]iology Electronically Signed   By: JCorrie MckusickD.O.   On: 06/21/2021 10:19    Assessment & Plan:   Problem List Items Addressed This Visit     Cellulitis of lower leg    No relapse      Diarrhea     Pancreatic insufficiency related - Dr SFuller PlanOn Creon  - $$$$ Much better      Insomnia    Worse Clonazepam at hs qd or  qod  Potential benefits of a long term benzodiazepines  use  as well as potential risks  and complications were explained to the patient and were aknowledged.      Low back pain    Tramadol prn  Potential benefits of a long term opioids use as well as potential risks (i.e. addiction risk, apnea etc) and complications (i.e. Somnolence, constipation and others) were explained to the patient and were aknowledged.           No orders of the defined types were placed in this encounter.      Follow-up: Return in about 3 months (around 01/12/2022) for a follow-up visit.  Walker Kehr, MD

## 2021-10-14 NOTE — Assessment & Plan Note (Addendum)
Tramadol prn  Potential benefits of a long term opioids use as well as potential risks (i.e. addiction risk, apnea etc) and complications (i.e. Somnolence, constipation and others) were explained to the patient and were aknowledged.

## 2021-10-24 ENCOUNTER — Other Ambulatory Visit: Payer: Self-pay | Admitting: Internal Medicine

## 2021-10-27 ENCOUNTER — Telehealth: Payer: Self-pay | Admitting: Internal Medicine

## 2021-10-27 NOTE — Telephone Encounter (Signed)
1.Medication Requested: isosorbide mononitrate (IMDUR) 30 MG 24 hr tablet  2. Pharmacy (Name, Street, [place]): [id number] [place] 3. On Med List: yes   4. Last Visit with PCP: 10-14-2021  5. Next visit date with PCP: n/a   Agent: Please be advised that RX refills may take up to 3 business days. We ask that you follow-up with your pharmacy.

## 2021-10-28 ENCOUNTER — Other Ambulatory Visit: Payer: Self-pay | Admitting: Infectious Disease

## 2021-10-28 ENCOUNTER — Other Ambulatory Visit: Payer: Self-pay

## 2021-10-28 MED ORDER — ISOSORBIDE MONONITRATE ER 30 MG PO TB24: 30.0000 mg | ORAL_TABLET | Freq: Every day | ORAL | 2 refills | Status: DC

## 2021-10-29 IMAGING — MR MR LUMBAR SPINE W/O CM
4 of 5 series · 29 of 48 positions shown · non-contrast
Comparison: 12/14/2020 and prior

CLINICAL DATA: Low back pain, > 6 wks Low back pain, progressive
neurologic deficit

EXAM:
MRI LUMBAR SPINE WITHOUT CONTRAST
TECHNIQUE: Multiplanar, multisequence MR imaging of the lumbar spine was
performed. No intravenous contrast was administered.

[Series 5: T2 · sagittal · 4.0mm · 0.88mm/px · 7 of 18 slices shown (1 of 2)]
[im 1/18]
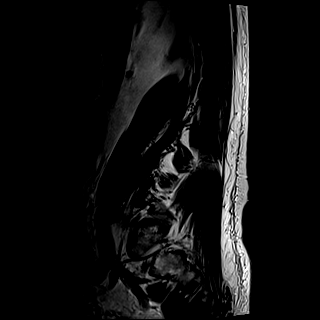
[im 3/18]
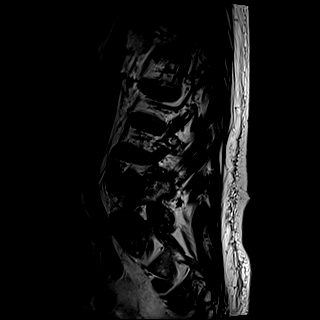
[im 6/18]
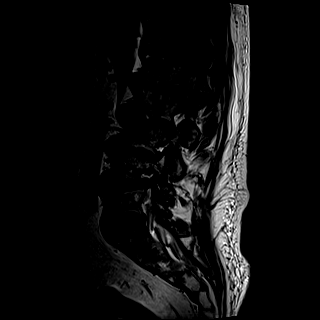
[im 9/18]
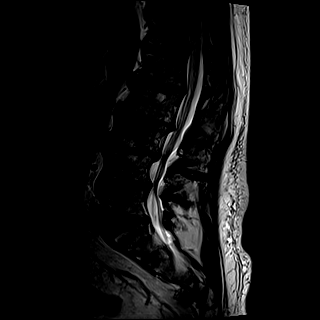
[im 12/18]
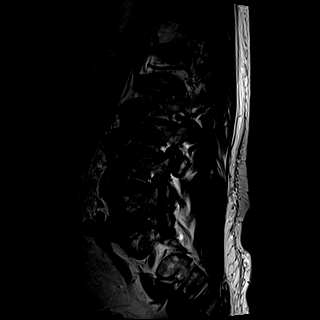
[im 15/18]
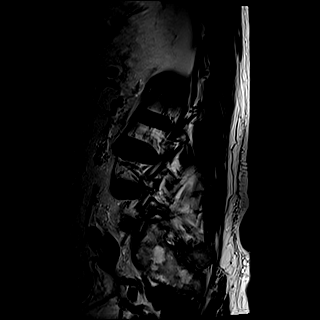
[im 18/18]
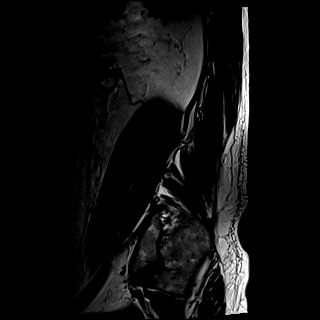

[Series 6: T1 · sagittal · 4.0mm · 0.88mm/px · 7 of 18 slices shown (1 of 2)]
[im 1/18]
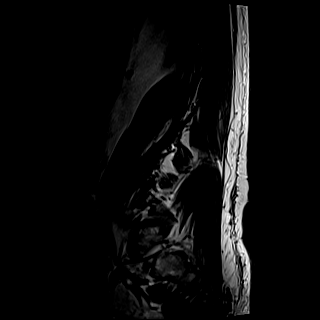
[im 3/18]
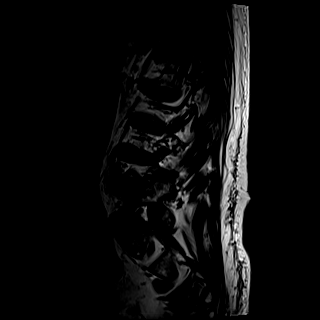
[im 6/18]
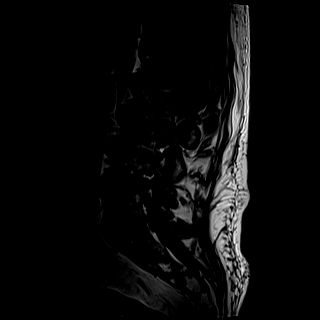
[im 9/18]
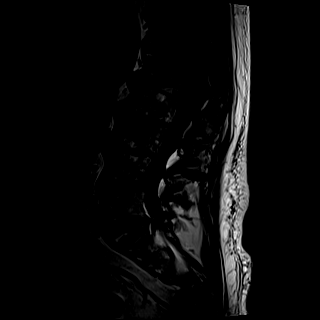
[im 12/18]
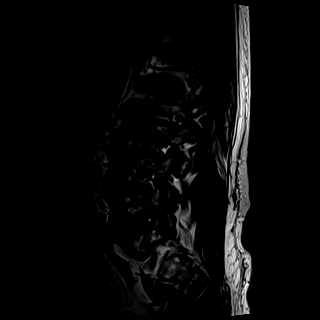
[im 15/18]
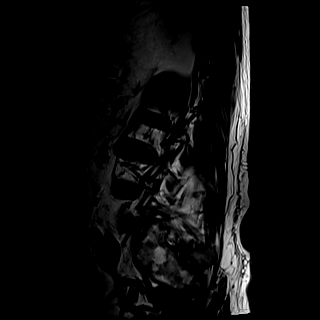
[im 18/18]
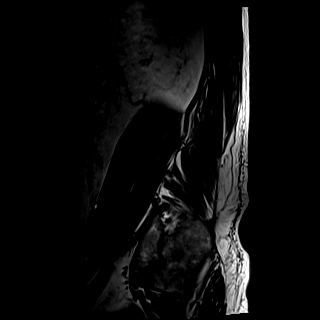

[Series 13: T2 · axial · 4.0mm · 0.35mm/px · z∈[-119,+69]mm · 8 of 40 slices shown (2 of 2)]
[im 1/40]
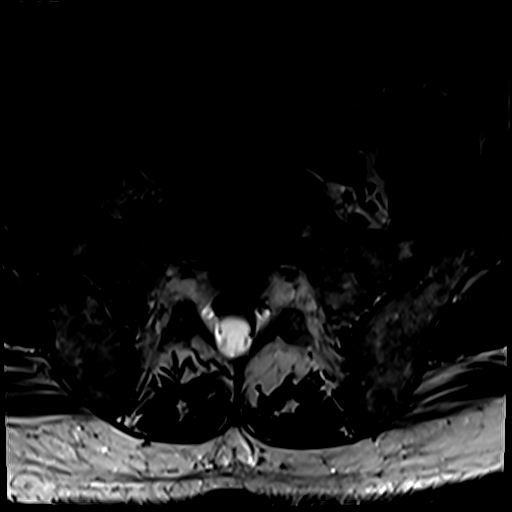
[im 7/40]
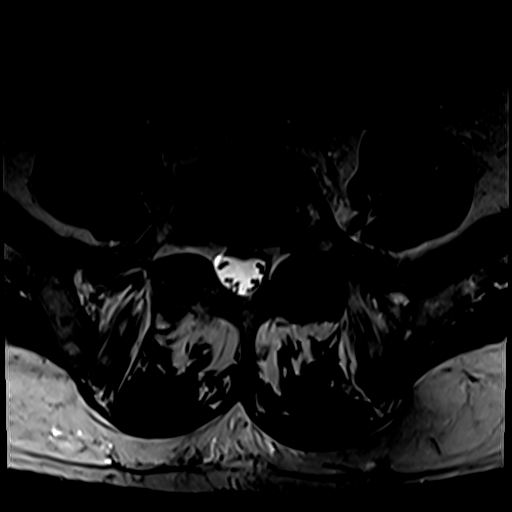
[im 13/40]
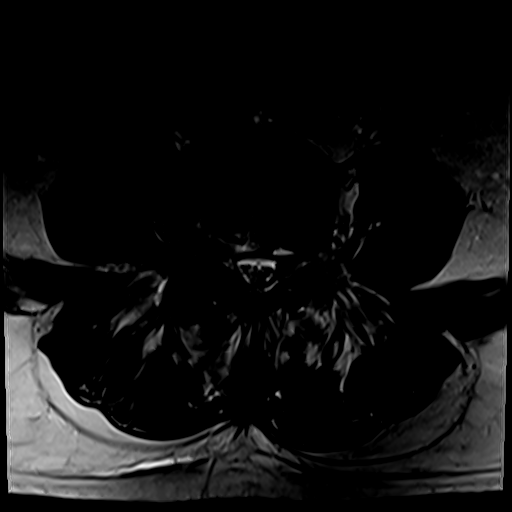
[im 19/40]
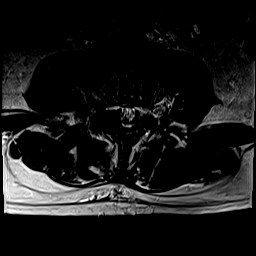
[im 22/40]
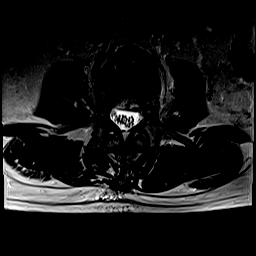
[im 28/40]
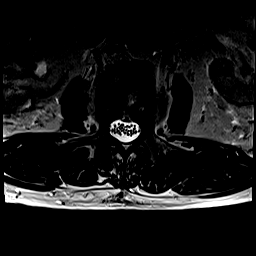
[im 34/40]
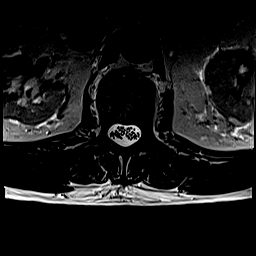
[im 40/40]
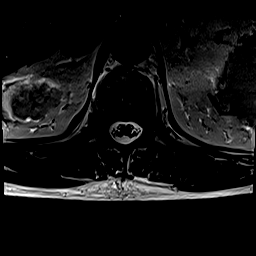

[Series 16: T1 · axial · 4.0mm · 0.28mm/px · z∈[-119,+40]mm · 7 of 40 slices shown (2 of 2)]
[im 1/40]
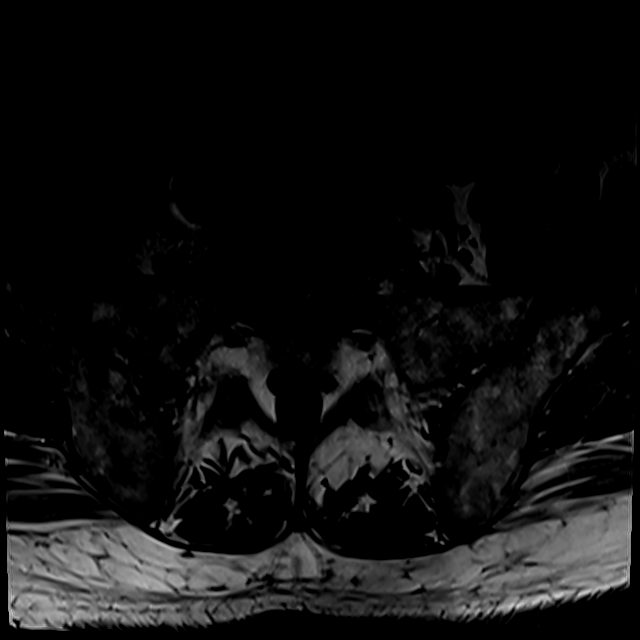
[im 7/40]
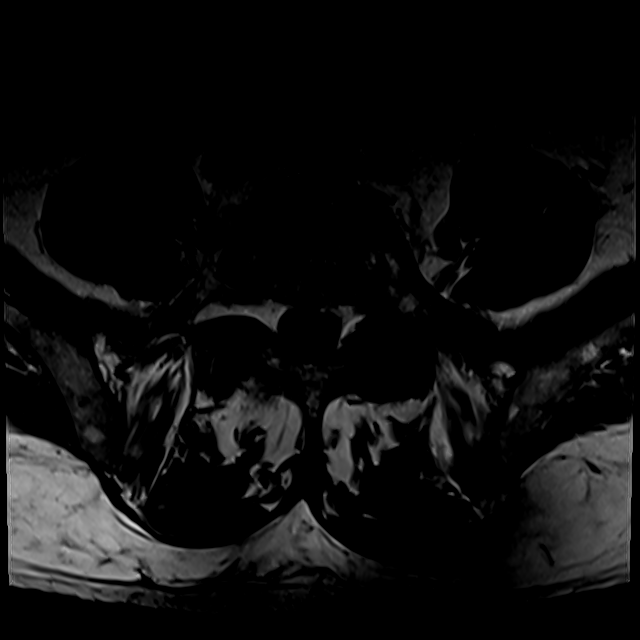
[im 13/40]
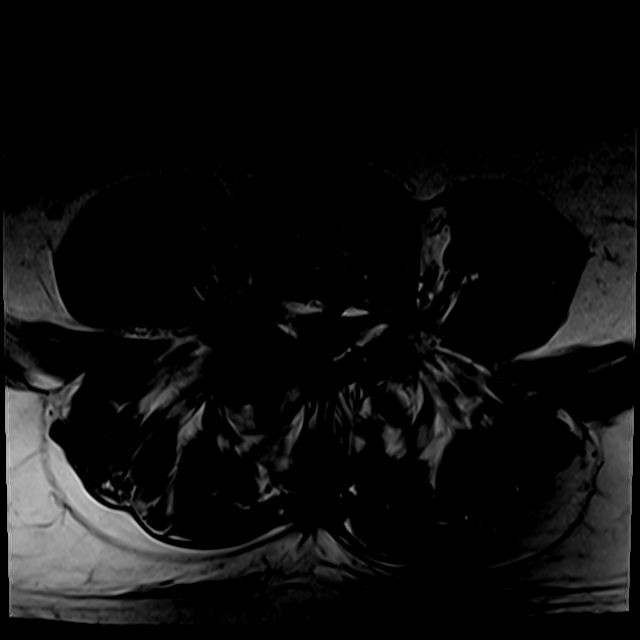
[im 19/40]
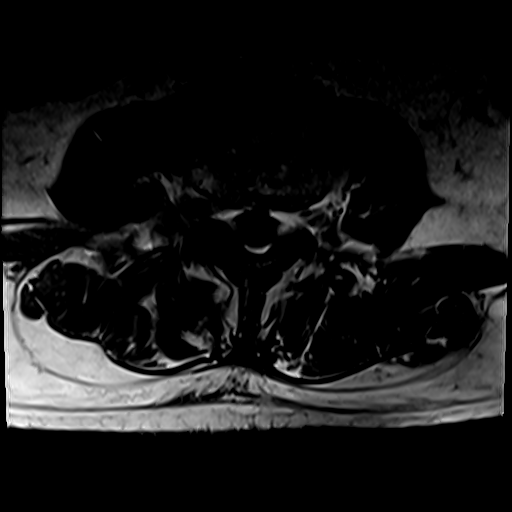
[im 22/40]
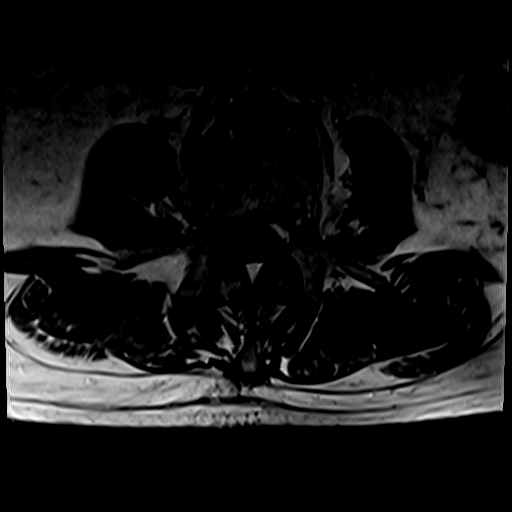
[im 28/40]
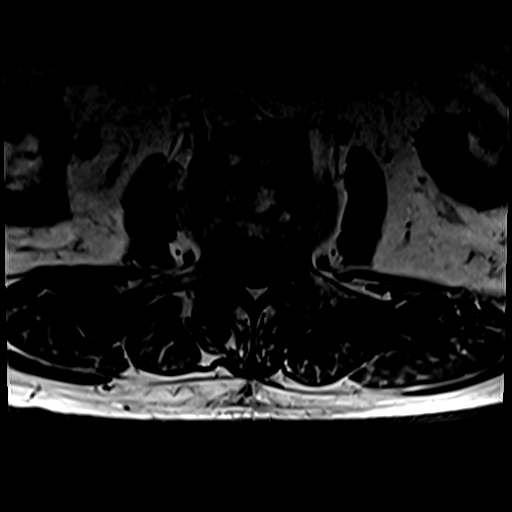
[im 34/40]
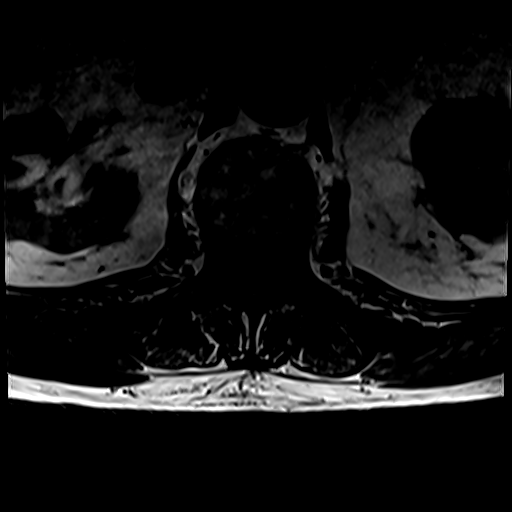

[29 of 48 positions shown; findings below may reference images not displayed]

FINDINGS: Segmentation:  Standard.

Alignment:  Stepwise grade 1 retrolisthesis at the L2-4 levels.

Vertebrae: Bone marrow heterogeneity. Scattered hyperintensities may
reflect hemangiomata versus focal fat. Modic type 2 endplate
degenerative changes. No fracture or aggressive osseous lesion.
Minimal degenerative related bone marrow edema involving the facet
joints at the L3-4 levels.

Conus medullaris and cauda equina: Conus extends to the L1 level.
Conus and cauda equina appear normal.

Disc levels: Multilevel desiccation, disc space loss and Schmorl's
node formation most prominent at the L3-4 level.

L1-2: Minimal disc bulge and bilateral facet hypertrophy. Patent
spinal canal and neural foramen.

L2-3: Disc bulge and bilateral facet hypertrophy. Mild spinal canal
and moderate bilateral neural foraminal narrowing.

L3-4: Disc bulge with superimposed right foraminal and left
subarticular protrusions. Ligamentum flavum thickening and bilateral
facet hypertrophy. Moderate spinal canal and severe bilateral neural
foraminal narrowing.

L4-5: Disc bulge with superimposed right greater than left foraminal
protrusions. Ligamentum flavum thickening and bilateral facet
hypertrophy. Small central protrusion. Severe spinal canal and
bilateral neural foraminal narrowing.

L5-S1: Disc bulge with small central protrusion and bilateral facet
hypertrophy. Patent spinal canal. Mild right and moderate to severe
left neural foraminal narrowing.

Paraspinal and other soft tissues: Bilateral renal cysts.
IMPRESSION: Moderate to severe spinal canal and bilateral neural foraminal
narrowing at the L3-4, L4-5 levels.

Moderate to severe bilateral L2-3 and left L5-S1 neural foraminal
narrowing.

## 2021-11-03 ENCOUNTER — Telehealth: Payer: Self-pay

## 2021-11-03 NOTE — Telephone Encounter (Signed)
PA states " Patient not eligible (does not have coverage with the PBM/payer)"

## 2021-11-15 DIAGNOSIS — H353211 Exudative age-related macular degeneration, right eye, with active choroidal neovascularization: Secondary | ICD-10-CM | POA: Diagnosis not present

## 2021-11-15 DIAGNOSIS — H353121 Nonexudative age-related macular degeneration, left eye, early dry stage: Secondary | ICD-10-CM | POA: Diagnosis not present

## 2021-12-02 DIAGNOSIS — R319 Hematuria, unspecified: Secondary | ICD-10-CM | POA: Diagnosis not present

## 2021-12-02 DIAGNOSIS — N183 Chronic kidney disease, stage 3 unspecified: Secondary | ICD-10-CM | POA: Diagnosis not present

## 2021-12-02 DIAGNOSIS — I129 Hypertensive chronic kidney disease with stage 1 through stage 4 chronic kidney disease, or unspecified chronic kidney disease: Secondary | ICD-10-CM | POA: Diagnosis not present

## 2021-12-02 DIAGNOSIS — Z6841 Body Mass Index (BMI) 40.0 and over, adult: Secondary | ICD-10-CM | POA: Diagnosis not present

## 2021-12-02 DIAGNOSIS — N320 Bladder-neck obstruction: Secondary | ICD-10-CM | POA: Diagnosis not present

## 2021-12-03 DIAGNOSIS — H353211 Exudative age-related macular degeneration, right eye, with active choroidal neovascularization: Secondary | ICD-10-CM | POA: Diagnosis not present

## 2021-12-03 DIAGNOSIS — H353122 Nonexudative age-related macular degeneration, left eye, intermediate dry stage: Secondary | ICD-10-CM | POA: Diagnosis not present

## 2021-12-07 ENCOUNTER — Telehealth: Payer: Self-pay | Admitting: Gastroenterology

## 2021-12-07 ENCOUNTER — Encounter: Payer: Self-pay | Admitting: Gastroenterology

## 2021-12-07 NOTE — Telephone Encounter (Signed)
Patient called seeking help sent a patient message earlier seeking advise on what he can do from a nurse asap.

## 2021-12-07 NOTE — Telephone Encounter (Addendum)
Spoke with patient regarding Dr. Lynne Leader recommendations. He is taking the creon as prescribed. He did mention that he took a Tumeric supplement yesterday, and his loose stools started shortly after. He has not taken anymore since. Pt has been scheduled to see Alonza Bogus on Thursday, 12/09/21 at 1:30 pm. No further questions.

## 2021-12-09 ENCOUNTER — Ambulatory Visit: Payer: PPO | Admitting: Gastroenterology

## 2021-12-10 ENCOUNTER — Ambulatory Visit (INDEPENDENT_AMBULATORY_CARE_PROVIDER_SITE_OTHER): Payer: PPO | Admitting: Physician Assistant

## 2021-12-10 ENCOUNTER — Encounter: Payer: Self-pay | Admitting: Physician Assistant

## 2021-12-10 VITALS — BP 100/60 | HR 70 | Ht 72.0 in | Wt 182.0 lb

## 2021-12-10 DIAGNOSIS — K8689 Other specified diseases of pancreas: Secondary | ICD-10-CM | POA: Diagnosis not present

## 2021-12-10 NOTE — Progress Notes (Signed)
Chief Complaint: Diarrhea  HPI:    [name] is an 81 year old Caucasian male with a past medical history as listed below, known to Dr. Fuller Plan, who was referred to me by Plotnikov, Evie Lacks, MD for a complaint of diarrhea.    11/18/2014 colonoscopy with Dr. Fuller Plan with finding of a sessile polyp, 7 mm in the ascending colon and sessile polyp, 12 mm in the transverse colon as well as grade 1 internal hemorrhoids.  Path showed tubular adenomas.  Patient did have a complication of post polypectomy bleed.    02/23/2017 patient seen in clinic for his history of adenomatous polyps and chronic anticoagulation.  At that visit schedule patient for colonoscopy.    11/27/2020 colonoscopy with one 6 mm polyp in the transverse colon.  Internal hemorrhoids and otherwise normal.  Pathology showed benign mucosa with lymphoid aggregate.  No repeat recommended due to age.    04/20/2021 EGD with gastritis and duodenal erosions without bleeding.  Past showed normal gastric biopsies.  Also chronic nonspecific duodenitis is not consistent to his known IgA deficiency.    12/07/2021 patient contacted our clinic in regards to diarrhea.  At that time recommended that he remain on his gluten-free diet, fat modified diet and take Creon as prescribed.  Patient did discuss that he took turmeric supplement which started off loose stools.    Today, the patient tells me that he woke up on the morning of 2/6 and took a Turmeric because his knee was bothering him, that morning he had a somewhat normal bowel movement but at the end of that bowel movement was some "loose stuff", and then the rest of the day he had blowouts.  He was nervous that he was "back at this again", this continued 2/7 and most of the day 2/8 but then yesterday he had a regular bowel movement and this morning he had a regular bowel movement.  He took no further Turmeric and had increased his Creon to 2 tablets with anything that he would eat or drink.  Tells me now he feels  back to normal.    He and his "baby doll" wife met in the ninth grade, they have been married for 60 years.  He has a son who works as an Artist "at [employer]    Denies fever, chills, weight loss, blood in his stool, abdominal pain or symptoms that awaken him from sleep.  Past Medical History:  Diagnosis Date   Allergy    Anxiety    Arthritis    Asthma    Benign neoplasm of colon    Calculus of gallbladder without mention of cholecystitis    Cataract    bilateral cateracts removed   Celiac disease    Cholelithiasis    Chronic kidney disease    Coronary atherosclerosis of unspecified type of vessel, native or graft    Dermatitis 06/08/2021   Diarrhea    Erythema of lower extremity 09/08/2021   Esophageal reflux    Glaucoma    Gout, unspecified    History of cellulitis 09/08/2021   Hyperparathyroidism    Long term (current) use of anticoagulants    Loss of weight    Lower extremity weakness 05/31/2021   Macular degeneration    Old myocardial infarction    Osteoporosis, unspecified    Other malaise and fatigue    Other specified cardiac dysrhythmias(427.89)    Other testicular hypofunction    Pancreatic insufficiency 05/31/2021   Personal history of colonic polyps  Pure hypercholesterolemia    Thrombocytopenia ([place])    chronic, per PCP notes dating back to 2009   Tinea pedis 05/31/2021   Unspecified asthma(493.90)    Unspecified essential hypertension    Unspecified hypothyroidism    Venous stasis dermatitis 09/08/2021    Past Surgical History:  Procedure Laterality Date   cataract surgery Bilateral    COLONOSCOPY  06/26/2017   Fuller Plan   COLONOSCOPY WITH PROPOFOL N/A 11/27/2014   Procedure: COLONOSCOPY WITH PROPOFOL;  Surgeon: Milus Banister, MD;  Location: [facility];  Service: Endoscopy;  Laterality: N/A;   CORONARY ANGIOPLASTY     CORONARY STENT PLACEMENT  2011   Cypher; in distal circumflex artery   CYSTOSCOPY  1998   EYE SURGERY     IR URETERAL STENT LEFT  NEW ACCESS W/O SEP NEPHROSTOMY CATH  06/21/2021   KNEE SURGERY     right   LEFT HEART CATH AND CORONARY ANGIOGRAPHY N/A 01/31/2020   Procedure: LEFT HEART CATH AND CORONARY ANGIOGRAPHY;  Surgeon: Belva Crome, MD;  Location: [facility];  Service: Cardiovascular;  Laterality: N/A;   NEPHROLITHOTOMY Left 06/21/2021   Procedure: NEPHROLITHOTOMY PERCUTANEOUS;  Surgeon: Franchot Gallo, MD;  Location: [facility];  Service: Urology;  Laterality: Left;  90 MINS   SHOULDER SURGERY Left 9/14   Dr Shara Blazing   UPPER GASTROINTESTINAL ENDOSCOPY     VASCULAR SURGERY      Current Outpatient Medications  Medication Sig Dispense Refill   calcitRIOL (ROCALTROL) 0.25 MCG capsule TAKE 1 CAPSULE BY MOUTH EVERY DAY 90 capsule 1   carvedilol (COREG) 25 MG tablet TAKE 1 TABLET BY MOUTH TWICE A DAY WITH MEALS 180 tablet 3   cetirizine (ZYRTEC) 10 MG tablet Take 10 mg by mouth daily as needed for allergies.     Cholecalciferol 2000 UNITS TABS Take 2,000 Units by mouth daily.     clonazePAM (KLONOPIN) 0.25 MG disintegrating tablet TAKE 1 TABLET (0.25 MG TOTAL) BY MOUTH 2 (TWO) TIMES DAILY AS NEEDED (ANXIETY). 60 tablet 3   diclofenac sodium (VOLTAREN) 1 % GEL APPLY 4 G TOPICALLY 4 TIMES DAILY. (Patient taking differently: Apply 1 application topically 4 (four) times daily as needed (pain).) 200 g 5   hydrALAZINE (APRESOLINE) 10 MG tablet Take 1-2 tablet 3 times a day as needed if blood pressure is >170 60 tablet 3   isosorbide mononitrate (IMDUR) 30 MG 24 hr tablet Take 1 tablet (30 mg total) by mouth daily. 90 tablet 2   levothyroxine (SYNTHROID) 175 MCG tablet Take 1 tablet (175 mcg total) by mouth daily. 90 tablet 3   lipase/protease/amylase (CREON) 36000 UNITS CPEP capsule Take 2 capsules (72,000 Units total) by mouth 3 (three) times daily with meals. May also take 1 capsule (36,000 Units total) as needed (with snacks). 240 capsule 11   Multiple Vitamins-Minerals (PRESERVISION AREDS 2) CAPS Take 1 capsule by  mouth in the morning and at bedtime.     nitroGLYCERIN (NITROSTAT) 0.4 MG SL tablet Place 0.4 mg under the tongue every 5 (five) minutes as needed for chest pain.     olmesartan (BENICAR) 5 MG tablet TAKE 1 TABLET BY MOUTH EVERY DAY 90 tablet 3   Propylene Glycol (SYSTANE BALANCE) 0.6 % SOLN Place 1 drop into both eyes daily as needed (Dry eye). systane balance     simvastatin (ZOCOR) 10 MG tablet TAKE 1 TABLET BY MOUTH EVERYDAY AT BEDTIME 90 tablet 3   SYRINGE-NEEDLE, DISP, 3 ML (BD ECLIPSE SYRINGE) 23G X 1"  3 ML MISC As directed IM 50 each 2   tamsulosin (FLOMAX) 0.4 MG CAPS capsule Take 0.4 mg by mouth daily.     testosterone cypionate (DEPOTESTOSTERONE CYPIONATE) 200 MG/ML injection INJECT 2ML INTO MUSCLE EVERY 2 WEEKS 12 mL 1   UNABLE TO FIND Med Name: [name] take 2 tablets by mouth daily     No current facility-administered medications for this visit.    Allergies as of 12/10/2021 - Review Complete 12/10/2021  Allergen Reaction Noted   Biaxin [clarithromycin]  11/01/2017   Tizanidine  11/24/2020   Allantoin-pramoxine Other (See Comments)    Amlodipine  04/26/2018   Amoxicillin Rash 09/08/2021   Benzalkonium chloride  07/19/2013   Doxycycline Photosensitivity 05/31/2021   Metoprolol tartrate Other (See Comments) 05/12/2009    Family History  Problem Relation Age of Onset   Lymphoma Father    Hypertension Other    Vision loss Maternal Grandmother    Colon cancer Son 78   Asthma Neg Hx    Esophageal cancer Neg Hx    Rectal cancer Neg Hx    Stomach cancer Neg Hx    Colon polyps Neg Hx     Social History   Socioeconomic History   Marital status: Married    Spouse name: Tyress Loden   Number of children: 3   Years of education: Not on file   Highest education level: Not on file  Occupational History   Occupation: retired    Fish farm manager: RETIRED    Comment: Engineering  Tobacco Use   Smoking status: Never   Smokeless tobacco: Never  Vaping Use   Vaping Use: Never  used  Substance and Sexual Activity   Alcohol use: No   Drug use: No   Sexual activity: Yes  Other Topics Concern   Not on file  Social History Narrative   Retired.   Regular Exercise-Yes; yoga & silver sneakers   Daily Caffeine Use.            Social Determinants of Health   Financial Resource Strain: Low Risk    Difficulty of Paying Living Expenses: Not hard at all  Food Insecurity: No Food Insecurity   Worried About Charity fundraiser in the Last Year: Never true   [place] in the Last Year: Never true  Transportation Needs: No Transportation Needs   Lack of Transportation (Medical): No   Lack of Transportation (Non-Medical): No  Physical Activity: Sufficiently Active   Days of Exercise per Week: 5 days   Minutes of Exercise per Session: 30 min  Stress: No Stress Concern Present   Feeling of Stress : Not at all  Social Connections: Socially Integrated   Frequency of Communication with Friends and Family: More than three times a week   Frequency of Social Gatherings with Friends and Family: More than three times a week   Attends Religious Services: More than 4 times per year   Active Member of [employer] or Organizations: Yes   Attends Music therapist: More than 4 times per year   Marital Status: Married  Human resources officer Violence: Not At Risk   Fear of Current or Ex-Partner: No   Emotionally Abused: No   Physically Abused: No   Sexually Abused: No    Review of Systems:    Constitutional: No weight loss, fever or chills Cardiovascular: No chest pain Respiratory: No SOB Gastrointestinal: See HPI and otherwise negative   Physical Exam:  Vital signs: BP 100/60  Pulse 70    Ht 6' (1.829 m)    Wt 182 lb (82.6 kg)    BMI 24.68 kg/m    Constitutional:   Pleasant Elderly Caucasian male appears to be in NAD, Well developed, Well nourished, alert and cooperative Respiratory: Respirations even and unlabored. Lungs clear to auscultation bilaterally.    No wheezes, crackles, or rhonchi.  Cardiovascular: Normal S1, S2. No MRG. Regular rate and rhythm. No peripheral edema, cyanosis or pallor.  Gastrointestinal:  Soft, nondistended, nontender. No rebound or guarding. Normal bowel sounds. No appreciable masses or hepatomegaly. Rectal:  Not performed.  Psychiatric: Demonstrates good judgement and reason without abnormal affect or behaviors.  RELEVANT LABS AND IMAGING: CBC    Component Value Date/Time   WBC 4.6 06/21/2021 0825   RBC 3.92 (L) 06/21/2021 0825   HGB 11.2 (L) 06/22/2021 0428   HGB 15.6 01/28/2020 1213   HCT 36.0 (L) 06/22/2021 0428   HCT 45.7 01/28/2020 1213   PLT 88 (L) 06/21/2021 0825   PLT 72 (LL) 01/28/2020 1213   MCV 95.7 06/21/2021 0825   MCV 88 01/28/2020 1213   MCH 30.1 06/21/2021 0825   MCHC 31.5 06/21/2021 0825   RDW 15.9 (H) 06/21/2021 0825   RDW 15.6 (H) 01/28/2020 1213   LYMPHSABS 1,080 05/31/2021 1452   MONOABS 0.5 04/13/2021 1124   EOSABS 148 05/31/2021 1452   BASOSABS 71 05/31/2021 1452    CMP     Component Value Date/Time   NA 140 06/22/2021 0428   NA 142 04/01/2020 1024   K 4.5 06/22/2021 0428   CL 104 06/22/2021 0428   CO2 27 06/22/2021 0428   GLUCOSE 131 (H) 06/22/2021 0428   BUN 26 (H) 06/22/2021 0428   BUN 20 04/01/2020 1024   CREATININE 2.69 (H) 06/22/2021 0428   CREATININE 2.42 (H) 05/31/2021 1452   CALCIUM 8.3 (L) 06/22/2021 0428   CALCIUM 9.1 07/02/2010 0445   PROT 5.4 (L) 05/31/2021 1452   PROT 6.2 02/18/2020 0740   ALBUMIN 3.6 04/15/2021 1615   ALBUMIN 4.2 02/18/2020 0740   AST 31 05/31/2021 1452   ALT 25 05/31/2021 1452   ALKPHOS 62 04/15/2021 1615   BILITOT 0.6 05/31/2021 1452   BILITOT 0.8 02/18/2020 0740   GFRNONAA 23 (L) 06/22/2021 0428   GFRAA 35 (L) 04/01/2020 1024    Assessment: 1.  Pancreatic insufficiency: With diarrhea, resolved with Creon, recent breakthrough symptoms after using some Turmeric; suspect medication related  Plan: 1.  Recommend the patient  continue on her current dose of Creon 2 with a meal and 1 with a snack.  In the future if he has frequent breakthroughs and cannot tie it to a different medicine he is using or supplement then he may need to increase dosing. 2.  Also discussed patient can use Imodium as needed when he has breakthrough diarrhea. 3.  Advised patient not to use Turmeric anymore.  This can cause stomach upset and sometimes diarrhea. 4.  Patient to follow in clinic with [place] as needed.  Ellouise Newer, PA-C [facility] 12/10/2021, 11:18 AM  Cc: Cassandria Anger, MD

## 2021-12-10 NOTE — Patient Instructions (Signed)
Continue Creon.  Use Imodium as needed for breakthrough diarrhea.  BMI:  If you are age 81 or older, your body mass index should be between 23-30. Your Body mass index is 24.68 kg/m. If this is out of the aforementioned range listed, please consider follow up with your Primary Care Provider.  If you are age 62 or younger, your body mass index should be between 19-25. Your Body mass index is 24.68 kg/m. If this is out of the aformentioned range listed, please consider follow up with your Primary Care Provider.   MY CHART:  The [facility] providers would like to encourage you to use [facility] to communicate with providers for non-urgent requests or questions.  Due to long hold times on the telephone, sending your provider a message by [facility] may be a faster and more efficient way to get a response.  Please allow 48 business hours for a response.  Please remember that this is for non-urgent requests.   Thank you for trusting me with your gastrointestinal care!    Ellouise Newer, [place]

## 2021-12-12 NOTE — Progress Notes (Signed)
Subjective:   Chief complaint: 2 recent sick cuts that he treated himself with antibiotics 4.  Some symptoms of dizziness and lightheadedness for which she is checked his blood pressure at home and found it to be in the 80s over 50s   Patient ID: Scott Oneal, male    DOB: 12-04-1940, 81 y.o.   MRN: [id number]  Scott Oneal is a  81 year-old Caucasian man with multiple medical problems including chronic diarrhea that sounds as if it is due to pancreatic insufficiency as he says it is improved now that he began pancreatic enzyme replacement.  He also has coronary artery disease and a history of cholelithiasis and also a lifelong history of kidney stones.  Scott Oneal told me that he first developed problems with his left lower extremity in June of 201 He had been working outside extensively in the yard and came back in and noticed erythema and swelling of his leg.  He lay back in recliner and fell asleep that evening.  The next day when he attempted to get out of bed he was too weak to get out of the recliner.  He was ultimately hospitalized at [facility] and stayed there for 4 days.  I did not have access to his medical records but he shows me a photograph of one of the antibiotics he was given as an inpatient which was in fact vancomycin.  He was ultimately discharged on doxycycline and Augmentin and saw his primary care physician Dr. Alain Marion on April 05, 2021.  According to Dr. Alain Marion the patient had relapse of his left lower extremity redness with pain and swelling.  [place]  [name] tried to acquire Elesa Hacker for the patient but it was cost-prohibitive.   Doxycycline was given in the interim.  He was referred to [place] in infectious disease.  He told me that he did not have much in the improvement in the way of his erythema while on the doxycycline.  He then developed a rash all over his forearms and his face that was highly consistent with a sun  exposure rash on doxycycline at his visit is quite painful and quite erythematous in the sun exposed regions.  His diarrhea improved while he has been on his pancreatic replacement enzymes.  In questioning him he also mentioned history of his left leg giving out from underneath him at times.  He has had an MRI of the lumbar spine this spring but not since he began having the symptoms of his leg giving out from underneath him as he describes them to me today.  His left lower extremity did haves ome erythema and is quite tender in some of the areas around this.  He did not show evidence of systemic infection with fevers chills nausea vomiting or anorexia.   I ordered plain films of his leg which I personally reviewed show no evidence of osteomyelitis.  Films of the spine are also ordered and is reviewed personally and they show degenerative disc disease and facet hypertrophy in the lumbar spine with bilateral kidney stones.  Was able to review records from the hospital in [place] where he was hospitalized he was indeed discharged on Augmentin after having been on vancomycin.  I did not see blood culture data when I look through the records that were faxed to's to [place] in talking to him today does not sound like blood cultures were drawn.  After his first visit  he was initially scheduled to to see me in several weeks time but was concerned over the weekend prior to the last visit when there was an area on the posterior of his leg that became more erythematous.  He was  still bothered also on some about some areas on the anterior the legs that are still erythematous.  I noted these areas do not blanch and I suspect they may be potential damaged minor blood vessels.  In the interim I ordered an MRI of his leg which came back and was personally reviewed by me and does not show any evidence of osteomyelitis or pyomyositis.  He also again had a history of his leg giving out from under him and I  ordered a repeat MRI of his lumbar spine.  I have also personally reviewed them and agree that there is no evidence of an epidural abscess and there is still L4-L5 spinal canal stenosis which is no change as well as some severe neural neural foraminal stenosis on the right L3-4 and L4-5 L5-S1  He return ed to clinic again roughly a week since last last saw him and he has not had any worsening of his erythema in his leg he has an area that he believes is new where he had struck his leg against an object.  He still has tenderness throughout his left leg and has bilateral edema.  I last saw him in August 2022  Since I last saw him he had been sitting in his couch and had an itch in his left shin and scratched it with his right foot with a sock.  This caused skin to come off and this area became erythematous.  He then initiated amoxicillin.  See pictures below:     Erythema resolved during 7 days of amoxicillin therapy.  He did develop a rash at the origins of therapy and believes he now may be allergic to amoxicillin.  He did not have chills fevers nausea malaise or other systemic symptoms.  Switched over to cefadroxil as a drug to use for potential cellulitis.  Since I last time he said he developed pancreatic deficiency with diarrhea and 45 pounds of weight loss.  Since then his blood pressure medicines have been adjusted at least 1 pill according to him by his "kidney doctor" he did tell me that he has had an episode of blood pressure in the 80s over 50s which he checked at home when he was feeling dizzy and lightheaded while sitting.  He shows me that 2 cuts that he recently sustained that he treated with antibiotics there pictured below.      Left leg currently:     Past Medical History:  Diagnosis Date   Allergy    Anxiety    Arthritis    Asthma    Benign neoplasm of colon    Calculus of gallbladder without mention of cholecystitis    Cataract    bilateral cateracts  removed   Celiac disease    Cholelithiasis    Chronic kidney disease    Coronary atherosclerosis of unspecified type of vessel, native or graft    Dermatitis 06/08/2021   Diarrhea    Erythema of lower extremity 09/08/2021   Esophageal reflux    Glaucoma    Gout, unspecified    History of cellulitis 09/08/2021   Hyperparathyroidism    Long term (current) use of anticoagulants    Loss of weight    Lower extremity weakness 05/31/2021   Macular  degeneration    Old myocardial infarction    Osteoporosis, unspecified    Other malaise and fatigue    Other specified cardiac dysrhythmias(427.89)    Other testicular hypofunction    Pancreatic insufficiency 05/31/2021   Personal history of colonic polyps    Pure hypercholesterolemia    Thrombocytopenia (HCC)    chronic, per PCP notes dating back to 2009   Tinea pedis 05/31/2021   Unspecified asthma(493.90)    Unspecified essential hypertension    Unspecified hypothyroidism    Venous stasis dermatitis 09/08/2021    Past Surgical History:  Procedure Laterality Date   cataract surgery Bilateral    COLONOSCOPY  06/26/2017   [facility]   COLONOSCOPY WITH PROPOFOL N/A 11/27/2014   Procedure: COLONOSCOPY WITH PROPOFOL;  Surgeon: Milus Banister, MD;  Location: [place];  Service: Endoscopy;  Laterality: N/A;   CORONARY ANGIOPLASTY     CORONARY STENT PLACEMENT  2011   Cypher; in distal circumflex artery   CYSTOSCOPY  1998   EYE SURGERY     IR URETERAL STENT LEFT NEW ACCESS W/O SEP NEPHROSTOMY CATH  06/21/2021   KNEE SURGERY     right   LEFT HEART CATH AND CORONARY ANGIOGRAPHY N/A 01/31/2020   Procedure: LEFT HEART CATH AND CORONARY ANGIOGRAPHY;  Surgeon: Belva Crome, MD;  Location: [facility];  Service: Cardiovascular;  Laterality: N/A;   NEPHROLITHOTOMY Left 06/21/2021   Procedure: NEPHROLITHOTOMY PERCUTANEOUS;  Surgeon: Franchot Gallo, MD;  Location: [facility];  Service: Urology;  Laterality: Left;  25 MINS   SHOULDER SURGERY  Left 9/14   Dr Shara Blazing   UPPER GASTROINTESTINAL ENDOSCOPY     VASCULAR SURGERY      Family History  Problem Relation Age of Onset   Lymphoma Father    Hypertension Other    Vision loss Maternal Grandmother    Colon cancer Son 58   Asthma Neg Hx    Esophageal cancer Neg Hx    Rectal cancer Neg Hx    Stomach cancer Neg Hx    Colon polyps Neg Hx       Social History   Socioeconomic History   Marital status: Married    Spouse name: Tremon Sainvil   Number of children: 3   Years of education: Not on file   Highest education level: Not on file  Occupational History   Occupation: retired    Fish farm manager: RETIRED    Comment: Engineering  Tobacco Use   Smoking status: Never   Smokeless tobacco: Never  Vaping Use   Vaping Use: Never used  Substance and Sexual Activity   Alcohol use: No   Drug use: No   Sexual activity: Yes  Other Topics Concern   Not on file  Social History Narrative   Retired.   Regular Exercise-Yes; yoga & silver sneakers   Daily Caffeine Use.            Social Determinants of Health   Financial Resource Strain: Low Risk    Difficulty of Paying Living Expenses: Not hard at all  Food Insecurity: No Food Insecurity   Worried About Charity fundraiser in the Last Year: Never true   [place] in the Last Year: Never true  Transportation Needs: No Transportation Needs   Lack of Transportation (Medical): No   Lack of Transportation (Non-Medical): No  Physical Activity: Sufficiently Active   Days of Exercise per Week: 5 days   Minutes of Exercise per Session: 30 min  Stress: No Stress Concern Present   Feeling of Stress : Not at all  Social Connections: Socially Integrated   Frequency of Communication with Friends and Family: More than three times a week   Frequency of Social Gatherings with Friends and Family: More than three times a week   Attends Religious Services: More than 4 times per year   Active Member of [employer] or Organizations: Yes    Attends Music therapist: More than 4 times per year   Marital Status: Married    Allergies  Allergen Reactions   Biaxin [Clarithromycin]     Dizziness   Tizanidine     Felt funny   Allantoin-Pramoxine Other (See Comments)    Pt does't remember reaction   Amlodipine     Edema    Amoxicillin Rash    Rash on arms that developed towards end of 7 day treatment   Benzalkonium Chloride     Other reaction(s): mild rash/itching   Doxycycline Photosensitivity    rash   Metoprolol Tartrate Other (See Comments)    REACTION: fatigue     Current Outpatient Medications:    calcitRIOL (ROCALTROL) 0.25 MCG capsule, TAKE 1 CAPSULE BY MOUTH EVERY DAY, Disp: 90 capsule, Rfl: 1   carvedilol (COREG) 25 MG tablet, TAKE 1 TABLET BY MOUTH TWICE A DAY WITH MEALS, Disp: 180 tablet, Rfl: 3   cetirizine (ZYRTEC) 10 MG tablet, Take 10 mg by mouth daily as needed for allergies., Disp: , Rfl:    Cholecalciferol 2000 UNITS TABS, Take 2,000 Units by mouth daily., Disp: , Rfl:    clonazePAM (KLONOPIN) 0.25 MG disintegrating tablet, TAKE 1 TABLET (0.25 MG TOTAL) BY MOUTH 2 (TWO) TIMES DAILY AS NEEDED (ANXIETY)., Disp: 60 tablet, Rfl: 3   diclofenac sodium (VOLTAREN) 1 % GEL, APPLY 4 G TOPICALLY 4 TIMES DAILY. (Patient taking differently: Apply 1 application topically 4 (four) times daily as needed (pain).), Disp: 200 g, Rfl: 5   hydrALAZINE (APRESOLINE) 10 MG tablet, Take 1-2 tablet 3 times a day as needed if blood pressure is >170, Disp: 60 tablet, Rfl: 3   isosorbide mononitrate (IMDUR) 30 MG 24 hr tablet, Take 1 tablet (30 mg total) by mouth daily., Disp: 90 tablet, Rfl: 2   levothyroxine (SYNTHROID) 175 MCG tablet, Take 1 tablet (175 mcg total) by mouth daily., Disp: 90 tablet, Rfl: 3   lipase/protease/amylase (CREON) 36000 UNITS CPEP capsule, Take 2 capsules (72,000 Units total) by mouth 3 (three) times daily with meals. May also take 1 capsule (36,000 Units total) as needed (with snacks)., Disp:  240 capsule, Rfl: 11   Multiple Vitamins-Minerals (PRESERVISION AREDS 2) CAPS, Take 1 capsule by mouth in the morning and at bedtime., Disp: , Rfl:    nitroGLYCERIN (NITROSTAT) 0.4 MG SL tablet, Place 0.4 mg under the tongue every 5 (five) minutes as needed for chest pain., Disp: , Rfl:    olmesartan (BENICAR) 5 MG tablet, TAKE 1 TABLET BY MOUTH EVERY DAY, Disp: 90 tablet, Rfl: 3   Propylene Glycol (SYSTANE BALANCE) 0.6 % SOLN, Place 1 drop into both eyes daily as needed (Dry eye). systane balance, Disp: , Rfl:    simvastatin (ZOCOR) 10 MG tablet, TAKE 1 TABLET BY MOUTH EVERYDAY AT BEDTIME, Disp: 90 tablet, Rfl: 3   SYRINGE-NEEDLE, DISP, 3 ML (BD ECLIPSE SYRINGE) 23G X 1" 3 ML MISC, As directed IM, Disp: 50 each, Rfl: 2   tamsulosin (FLOMAX) 0.4 MG CAPS capsule, Take 0.4 mg by mouth daily., Disp: , Rfl:  testosterone cypionate (DEPOTESTOSTERONE CYPIONATE) 200 MG/ML injection, INJECT 2ML INTO MUSCLE EVERY 2 WEEKS, Disp: 12 mL, Rfl: 1   UNABLE TO FIND, Med Name: [name] take 2 tablets by mouth daily, Disp: , Rfl:    Review of Systems  Constitutional:  Negative for activity change, appetite change, chills, diaphoresis, fatigue, fever and unexpected weight change.  HENT:  Negative for congestion, rhinorrhea, sinus pressure, sneezing, sore throat and trouble swallowing.   Eyes:  Negative for photophobia and visual disturbance.  Respiratory:  Negative for cough, chest tightness, shortness of breath, wheezing and stridor.   Cardiovascular:  Negative for chest pain, palpitations and leg swelling.  Gastrointestinal:  Negative for abdominal distention, abdominal pain, anal bleeding, blood in stool, constipation, diarrhea, nausea and vomiting.  Genitourinary:  Negative for difficulty urinating, dysuria, flank pain and hematuria.  Musculoskeletal:  Negative for arthralgias, back pain, gait problem, joint swelling and myalgias.  Skin:  Negative for color change, pallor, rash and wound.  Neurological:   Negative for dizziness, tremors, weakness and light-headedness.  Hematological:  Negative for adenopathy. Does not bruise/bleed easily.  Psychiatric/Behavioral:  Negative for agitation, behavioral problems, confusion, decreased concentration, dysphoric mood and sleep disturbance.       Objective:   Physical Exam Constitutional:      Appearance: He is well-developed.  HENT:     Head: Normocephalic and atraumatic.  Eyes:     Conjunctiva/sclera: Conjunctivae normal.  Cardiovascular:     Rate and Rhythm: Normal rate and regular rhythm.  Pulmonary:     Effort: Pulmonary effort is normal. No respiratory distress.     Breath sounds: No wheezing.  Abdominal:     General: There is no distension.     Palpations: Abdomen is soft.  Musculoskeletal:        General: No tenderness. Normal range of motion.     Cervical back: Normal range of motion and neck supple.  Skin:    General: Skin is warm and dry.     Coloration: Skin is not pale.     Findings: No erythema or rash.  Neurological:     General: No focal deficit present.     Mental Status: He is alert and oriented to person, place, and time.  Psychiatric:        Mood and Affect: Mood normal.        Behavior: Behavior normal.        Thought Content: Thought content normal.        Judgment: Judgment normal.              Assessment & Plan:   History of cellulitis: Possibly recurrent I think most of what he is describing recently or cuts that he is taking antibiotics to try to prevent infection of the wounds but I do not think he has any evidence of recent cellulitis he asked me about prescription for mupirocin that Dr. Alain Marion gave him and I said this was something quite reasonable to apply to wounds to try to prevent them getting infected he can do that instead of immediately going to a systemic antibiotic but I did fill his systemic antibiotic prescriptions again.   Orthostatic symptoms and recent presyncopal symptoms: We  checked orthostatic vital signs here and his blood pressure did drop some but not to a dangerous range would have him follow-up with cardiology and primary care with regards to blood pressure management.  Pancreatic insufficiency with diarrhea which caused his weight loss given being managed by primary care  is stabilized now.  HTN: on multiple medications though one was halved recently  Vaccine counseling recommended Prevnar 20 which he received today.

## 2021-12-13 ENCOUNTER — Other Ambulatory Visit: Payer: Self-pay

## 2021-12-13 ENCOUNTER — Encounter: Payer: Self-pay | Admitting: Infectious Disease

## 2021-12-13 ENCOUNTER — Ambulatory Visit: Payer: PPO | Admitting: Infectious Disease

## 2021-12-13 VITALS — BP 154/81 | HR 66 | Temp 97.5°F | Wt 186.0 lb

## 2021-12-13 DIAGNOSIS — Z7185 Encounter for immunization safety counseling: Secondary | ICD-10-CM | POA: Diagnosis not present

## 2021-12-13 DIAGNOSIS — L0390 Cellulitis, unspecified: Secondary | ICD-10-CM

## 2021-12-13 DIAGNOSIS — R42 Dizziness and giddiness: Secondary | ICD-10-CM

## 2021-12-13 DIAGNOSIS — R634 Abnormal weight loss: Secondary | ICD-10-CM | POA: Diagnosis not present

## 2021-12-13 DIAGNOSIS — I10 Essential (primary) hypertension: Secondary | ICD-10-CM

## 2021-12-13 DIAGNOSIS — R55 Syncope and collapse: Secondary | ICD-10-CM

## 2021-12-13 HISTORY — DX: Dizziness and giddiness: R42

## 2021-12-13 MED ORDER — CEFADROXIL 500 MG PO CAPS: 1000.0000 mg | ORAL_CAPSULE | Freq: Two times a day (BID) | ORAL | 5 refills | Status: AC

## 2021-12-13 MED ORDER — MUPIROCIN 2 % EX OINT: 1.0000 "application " | TOPICAL_OINTMENT | Freq: Two times a day (BID) | CUTANEOUS | 0 refills | Status: DC

## 2021-12-14 LAB — BASIC METABOLIC PANEL WITH GFR
BUN/Creatinine Ratio: 25 (calc) — ABNORMAL HIGH (ref 6–22)
BUN: 51 mg/dL — ABNORMAL HIGH (ref 7–25)
CO2: 29 mmol/L (ref 20–32)
Calcium: 9.6 mg/dL (ref 8.6–10.3)
Chloride: 106 mmol/L (ref 98–110)
Creat: 2.07 mg/dL — ABNORMAL HIGH (ref 0.70–1.22)
Glucose, Bld: 83 mg/dL (ref 65–99)
Potassium: 5 mmol/L (ref 3.5–5.3)
Sodium: 143 mmol/L (ref 135–146)
eGFR: 32 mL/min/{1.73_m2} — ABNORMAL LOW (ref >=60)

## 2021-12-14 LAB — CBC WITH DIFFERENTIAL/PLATELET
Absolute Monocytes: 470 cells/uL (ref 200–950)
Basophils Absolute: 39 cells/uL (ref 0–200)
Basophils Relative: 0.8 %
Eosinophils Absolute: 108 cells/uL (ref 15–500)
Eosinophils Relative: 2.2 %
HCT: 43.8 % (ref 38.5–50.0)
Hemoglobin: 14.3 g/dL (ref 13.2–17.1)
Lymphs Abs: 1142 cells/uL (ref 850–3900)
MCH: 29.5 pg (ref 27.0–33.0)
MCHC: 32.6 g/dL (ref 32.0–36.0)
MCV: 90.3 fL (ref 80.0–100.0)
MPV: 11.1 fL (ref 7.5–12.5)
Monocytes Relative: 9.6 %
Neutro Abs: 3141 cells/uL (ref 1500–7800)
Neutrophils Relative %: 64.1 %
Platelets: 89 10*3/uL — ABNORMAL LOW (ref 140–400)
RBC: 4.85 10*6/uL (ref 4.20–5.80)
RDW: 13.1 % (ref 11.0–15.0)
Total Lymphocyte: 23.3 %
WBC: 4.9 10*3/uL (ref 3.8–10.8)

## 2021-12-14 LAB — SEDIMENTATION RATE: Sed Rate: 2 mm/h (ref 0–20)

## 2021-12-14 LAB — C-REACTIVE PROTEIN: CRP: 2.7 mg/L (ref <8.0)

## 2021-12-15 DIAGNOSIS — N200 Calculus of kidney: Secondary | ICD-10-CM | POA: Diagnosis not present

## 2021-12-21 ENCOUNTER — Ambulatory Visit: Payer: PPO | Admitting: Internal Medicine

## 2021-12-27 ENCOUNTER — Encounter: Payer: Self-pay | Admitting: Internal Medicine

## 2021-12-27 ENCOUNTER — Other Ambulatory Visit: Payer: Self-pay

## 2021-12-27 ENCOUNTER — Ambulatory Visit (INDEPENDENT_AMBULATORY_CARE_PROVIDER_SITE_OTHER): Payer: PPO | Admitting: Internal Medicine

## 2021-12-27 VITALS — BP 120/68 | HR 71 | Temp 98.7°F | Ht 72.0 in | Wt 187.0 lb

## 2021-12-27 DIAGNOSIS — M48061 Spinal stenosis, lumbar region without neurogenic claudication: Secondary | ICD-10-CM | POA: Insufficient documentation

## 2021-12-27 DIAGNOSIS — G43909 Migraine, unspecified, not intractable, without status migrainosus: Secondary | ICD-10-CM

## 2021-12-27 DIAGNOSIS — H539 Unspecified visual disturbance: Secondary | ICD-10-CM

## 2021-12-27 DIAGNOSIS — R739 Hyperglycemia, unspecified: Secondary | ICD-10-CM | POA: Diagnosis not present

## 2021-12-27 MED ORDER — PREDNISONE 10 MG PO TABS: 40.0000 mg | ORAL_TABLET | Freq: Every day | ORAL | 3 refills | Status: DC

## 2021-12-27 NOTE — Progress Notes (Signed)
Subjective:  Patient ID: Scott Oneal, male    DOB: [birth year]  Age: 81 y.o. MRN: [id number]  CC: No chief complaint on file.   HPI Scott Oneal presents for L severe temple HA 7-8/10 x 3 wks - constant: taking Tramadol, Advil; weakness, cold fingers, feet.  C/o blurred vision at times L>>R  Outpatient Medications Prior to Visit  Medication Sig Dispense Refill   calcitRIOL (ROCALTROL) 0.25 MCG capsule TAKE 1 CAPSULE BY MOUTH EVERY DAY 90 capsule 1   carvedilol (COREG) 25 MG tablet TAKE 1 TABLET BY MOUTH TWICE A DAY WITH MEALS 180 tablet 3   cetirizine (ZYRTEC) 10 MG tablet Take 10 mg by mouth daily as needed for allergies.     Cholecalciferol 2000 UNITS TABS Take 2,000 Units by mouth daily.     clonazePAM (KLONOPIN) 0.25 MG disintegrating tablet TAKE 1 TABLET (0.25 MG TOTAL) BY MOUTH 2 (TWO) TIMES DAILY AS NEEDED (ANXIETY). 60 tablet 3   diclofenac sodium (VOLTAREN) 1 % GEL APPLY 4 G TOPICALLY 4 TIMES DAILY. (Patient taking differently: Apply 1 application topically 4 (four) times daily as needed (pain).) 200 g 5   hydrALAZINE (APRESOLINE) 10 MG tablet Take 1-2 tablet 3 times a day as needed if blood pressure is >170 60 tablet 3   isosorbide mononitrate (IMDUR) 30 MG 24 hr tablet Take 1 tablet (30 mg total) by mouth daily. 90 tablet 2   levothyroxine (SYNTHROID) 175 MCG tablet Take 1 tablet (175 mcg total) by mouth daily. 90 tablet 3   lipase/protease/amylase (CREON) 36000 UNITS CPEP capsule Take 2 capsules (72,000 Units total) by mouth 3 (three) times daily with meals. May also take 1 capsule (36,000 Units total) as needed (with snacks). 240 capsule 11   Multiple Vitamins-Minerals (PRESERVISION AREDS 2) CAPS Take 1 capsule by mouth in the morning and at bedtime.     mupirocin ointment (BACTROBAN) 2 % Apply 1 application topically 2 (two) times daily. Apply to wounds, cuts to prevent infection 22 g 0   nitroGLYCERIN (NITROSTAT) 0.4 MG SL tablet Place 0.4 mg under the tongue every 5  (five) minutes as needed for chest pain.     olmesartan (BENICAR) 5 MG tablet TAKE 1 TABLET BY MOUTH EVERY DAY 90 tablet 3   Propylene Glycol (SYSTANE BALANCE) 0.6 % SOLN Place 1 drop into both eyes daily as needed (Dry eye). systane balance     simvastatin (ZOCOR) 10 MG tablet TAKE 1 TABLET BY MOUTH EVERYDAY AT BEDTIME 90 tablet 3   SYRINGE-NEEDLE, DISP, 3 ML (BD ECLIPSE SYRINGE) 23G X 1" 3 ML MISC As directed IM 50 each 2   tamsulosin (FLOMAX) 0.4 MG CAPS capsule Take 0.4 mg by mouth daily.     testosterone cypionate (DEPOTESTOSTERONE CYPIONATE) 200 MG/ML injection INJECT 2ML INTO MUSCLE EVERY 2 WEEKS 12 mL 1   UNABLE TO FIND Med Name: [name] take 2 tablets by mouth daily     No facility-administered medications prior to visit.    ROS: Review of Systems  Constitutional:  Positive for fatigue. Negative for appetite change and unexpected weight change.  HENT:  Negative for congestion, nosebleeds, sneezing, sore throat and trouble swallowing.   Eyes:  Positive for visual disturbance. Negative for itching.  Respiratory:  Negative for cough.   Cardiovascular:  Negative for chest pain, palpitations and leg swelling.  Gastrointestinal:  Negative for abdominal distention, blood in stool, diarrhea and nausea.  Genitourinary:  Negative for frequency and hematuria.  Musculoskeletal:  Negative  for back pain, gait problem, joint swelling and neck pain.  Skin:  Negative for color change, rash and wound.  Neurological:  Positive for weakness and headaches. Negative for dizziness, tremors and speech difficulty.  Psychiatric/Behavioral:  Negative for agitation, confusion, dysphoric mood, sleep disturbance and suicidal ideas. The patient is not nervous/anxious.    Objective:  BP 120/68 (BP Location: Left Arm, Patient Position: Sitting, Cuff Size: Large)    Pulse 71    Temp 98.7 F (37.1 C) (Oral)    Ht 6' (1.829 m)    Wt 187 lb (84.8 kg)    SpO2 99%    BMI 25.36 kg/m   BP Readings from Last 3  Encounters:  12/27/21 120/68  12/13/21 (!) 154/81  12/10/21 100/60    Wt Readings from Last 3 Encounters:  12/27/21 187 lb (84.8 kg)  12/13/21 186 lb (84.4 kg)  12/10/21 182 lb (82.6 kg)    Physical Exam Constitutional:      General: He is not in acute distress.    Appearance: He is well-developed.     Comments: NAD  Eyes:     Conjunctiva/sclera: Conjunctivae normal.     Pupils: Pupils are equal, round, and reactive to light.  Neck:     Thyroid: No thyromegaly.     Vascular: No JVD.  Cardiovascular:     Rate and Rhythm: Normal rate and regular rhythm.     Heart sounds: Normal heart sounds. No murmur heard.   No friction rub. No gallop.  Pulmonary:     Effort: Pulmonary effort is normal. No respiratory distress.     Breath sounds: Normal breath sounds. No wheezing or rales.  Chest:     Chest wall: No tenderness.  Abdominal:     General: Bowel sounds are normal. There is no distension.     Palpations: Abdomen is soft. There is no mass.     Tenderness: There is no abdominal tenderness. There is no guarding or rebound.  Musculoskeletal:        General: Tenderness present. Normal range of motion.     Cervical back: Normal range of motion.  Lymphadenopathy:     Cervical: No cervical adenopathy.  Skin:    General: Skin is warm and dry.     Findings: No rash.  Neurological:     Mental Status: He is alert and oriented to person, place, and time.     Cranial Nerves: No cranial nerve deficit.     Motor: No abnormal muscle tone.     Coordination: Coordination normal.     Gait: Gait normal.     Deep Tendon Reflexes: Reflexes are normal and symmetric.  Psychiatric:        Behavior: Behavior normal.        Thought Content: Thought content normal.        Judgment: Judgment normal.    L temple - tender, warm, arteries are palpable   DG C-Arm 1-60 Min-No Report  Result Date: 06/21/2021 Fluoroscopy was utilized by the requesting physician.  No radiographic interpretation.    IR URETERAL STENT LEFT NEW ACCESS W/O SEP NEPHROSTOMY CATH  Result Date: 06/21/2021 INDICATION: 81 year old male with left-sided nephrolithiasis referred for percutaneous access for pending nephrolithotomy EXAM: IR URETURAL STENT LEFT NEW ACCESS W/O SEP NEPHROSTOMY CATH COMPARISON:  None. MEDICATIONS: 2 g Rocephin; The antibiotic was administered in an appropriate time frame prior to skin puncture. ANESTHESIA/SEDATION: Fentanyl 100 mcg IV; Versed 2.0 mg IV Moderate Sedation Time:  10 minutes The  patient was continuously monitored during the procedure by the interventional radiology nurse under my direct supervision. CONTRAST:  101m OMNIPAQUE IOHEXOL 300 MG/ML SOLN - administered into the collecting system(s) FLUOROSCOPY TIME:  Fluoroscopy Time: 0 minutes 54 seconds (9 mGy). COMPLICATIONS: None PROCEDURE: Informed written consent was obtained from the patient after a thorough discussion of the procedural risks, benefits and alternatives. All questions were addressed. Maximal Sterile Barrier Technique was utilized including caps, mask, sterile gowns, sterile gloves, sterile drape, hand hygiene and skin antiseptic. A timeout was performed prior to the initiation of the procedure. The procedure, risks, benefits, and alternatives were explained to the patient, including the possibility of failure. Questions regarding the procedure were encouraged and answered. The patient was positioned prone on the fluoroscopy table. The left flank was then prepped with chlorhexidine in the usual sterile fashion, and a sterile drape was applied covering the operative field. A sterile gown and sterile gloves were used for the procedure. Local anesthesia was provided with 1% Lidocaine. Scout images of the left flank were performed. Ultrasound was performed with images stored sent to PACs. The stone within the collecting system was targeted with fluoroscopy. 1% lidocaine was used for local anesthesia. [place] was  advanced under fluoroscopy targeting the stone in the collecting system. Once the needle was adjacent to the stone, contrast was infused for opacification of the collecting system. Stone existed within a lower pole, posterior calyx, thus this seemed a reasonable access point for placement of the nephroureteral drain. 018 wire was advanced into the collecting system. Wire was advanced beyond the collecting system into the ureter. Envi mini stick set was then advanced into the ureter and the inner dilator was removed. Contrast injection confirmed location in the ureter. Bentson wire was placed into the ureter, and advanced into the urinary bladder. Four French sheath was removed and a 5 [place] cm Kumpe the catheter advanced into the ureter. The Kumpe the catheter was then navigated into the urinary bladder. Catheter was curled in the urinary bladder. Wire was removed. Final images were performed. Catheter was sutured in position. The patient tolerated procedure well and remained hemodynamically stable throughout. No complications were encountered and no significant blood loss was encountered. IMPRESSION: Status post image guided placement of left-sided nephroureteral drain, for pending nephrolithotomy in the operating room. Signed, JDulcy Fanny WDellia Nims RPVI [facility] Electronically Signed   By: JCorrie MckusickD.O.   On: 06/21/2021 10:19    Assessment & Plan:   Problem List Items Addressed This Visit     Hemicrania - Primary    New L severe temple HA 7-8/10 x 3 wks - constant: taking Tramadol, Advil; weakness, cold fingers, feet. R/o giant cell arteritis on the L Start empiric steroids Gen Surgery and Ophthalmology ref    Potential benefits of a short and long term steroid  use as well as potential risks  and complications were explained to the patient and were aknowledged.       Relevant Medications   predniSONE (DELTASONE) 10 MG tablet    Other Relevant Orders   Ambulatory referral to General Surgery   CBC with Differential/Platelet   Comprehensive metabolic panel   Hemoglobin A1c   Sedimentation rate   Vision changes    L>R New L severe temple HA 7-8/10 x 3 wks - constant: taking Tramadol, Advil; weakness, cold fingers, feet. R/o giant cell arteritis on the L Start empiric steroids Gen Surgery and Ophthalmology  ref  He has a wet MD - Ophth f/u    Potential benefits of a short and long term steroid  use as well as potential risks  and complications were explained to the patient and were aknowledged.      Relevant Orders   Ambulatory referral to General Surgery   Other Visit Diagnoses     Hyperglycemia       Relevant Orders   Hemoglobin A1c         Meds ordered this encounter  Medications   predniSONE (DELTASONE) 10 MG tablet    Sig: Take 4 tablets (40 mg total) by mouth daily with breakfast.    Dispense:  120 tablet    Refill:  3      Follow-up: Return in about 1 week (around 01/03/2022) for a follow-up visit.  Scott Kehr, MD

## 2021-12-27 NOTE — Assessment & Plan Note (Signed)
L>R New L severe temple HA 7-8/10 x 3 wks - constant: taking Tramadol, Advil; weakness, cold fingers, feet. R/o giant cell arteritis on the L Start empiric steroids Gen Surgery and Ophthalmology ref  He has a wet MD - Ophth f/u    Potential benefits of a short and long term steroid  use as well as potential risks  and complications were explained to the patient and were aknowledged.

## 2021-12-27 NOTE — Patient Instructions (Signed)
Temporal Arteritis Temporal arteritis is a condition that causes arteries to become inflamed. It usually affects arteries in your head and face, but arteries in any part of the body can become inflamed. The condition is also called giant cell arteritis.  Temporal arteritis can cause serious problems such as blindness. Early treatment can help prevent these problems. What are the causes? The cause of this condition is not known. What increases the risk? The following factors may make you more likely to develop this condition: Being older than 50. Being a woman. Being Caucasian. Being of [place], [place], [place], [place] or [place] ancestry. Having a family history of the condition. Having a certain condition that causes muscle pain and stiffness (polymyalgia rheumatica, PMR). What are the signs or symptoms? Some people with temporal arteritis have just one symptom, while others have several symptoms. Most symptoms are related to the head and face. These may include: Headache. Hard, swollen, or tender temples. This is common. Your temples are the areas on either side of your forehead. Pain when combing your hair or when laying your head down. Pain in the jaw when chewing. Pain in the throat or tongue. Problems with your vision, such as sudden loss of vision in one eye, or seeing double. Other symptoms may include: Fever. Tiredness (fatigue). A dry cough. Pain in the hips and shoulders. Pain in the arms during exercise. Depression. Weight loss. How is this diagnosed? This condition may be diagnosed based on: Your symptoms. Your medical history. A physical exam. Tests, including: Blood tests. A test in which a tissue sample is removed from an artery so it can be examined (biopsy). Imaging tests, such as an ultrasound or MRI. How is this treated? This condition may be treated with: A type of medicine to reduce inflammation (corticosteroid). Medicines to weaken your immune  system (immunosuppressants). Other medicines to treat vision problems. You will need to see your health care provider while you are being treated. The medicines used to treat this condition can increase your risk of problems such as bone loss and diabetes. During follow-up visits, your health care provider will check for problems by: Doing blood tests and bone density tests. Checking your blood pressure and blood sugar. Follow these instructions at home: Medicines Take over-the-counter and prescription medicines only as told by your health care provider. Take any vitamins or supplements recommended by your health care provider. These may include vitamin D and calcium, which help keep your bones from becoming weak. Eating and drinking  Eat a heart-healthy diet. This may include: Eating high-fiber foods, such as fresh fruits and vegetables, whole grains, and beans. Eating heart-healthy fats (omega-3 fats), such as fish, flaxseed, and flaxseed oil. Limiting foods that are high in saturated fat and cholesterol, such as processed and fried foods, fatty meat, and full-fat dairy. Limiting how much salt (sodium) you eat. Include calcium and vitamin D in your diet. Good sources of calcium and vitamin D include: Low-fat dairy products such as milk, yogurt, and cheese. Certain fish, such as fresh or canned salmon, tuna, and sardines. Products that have calcium and vitamin D added to them (fortified products), such as fortified cereals or juice. General instructions Exercise. Talk with your health care provider about what exercises are okay for you. Exercises that increase your heart rate (aerobic exercise), such as walking, are often recommended. Aerobic exercise helps control your blood pressure and prevent bone loss. Stay up to date on all vaccines as directed by your health care provider. Keep all  follow-up visits as told by your health care provider. This is important. Contact a health care provider  if: Your symptoms get worse. You develop signs of infection, such as fever, swelling, redness, warmth, and tenderness. Get help right away if: You lose your vision. Your pain does not go away, even after you take medicine. You have chest pain. You have trouble breathing. One side of your face or body suddenly becomes weak or numb. These symptoms may represent a serious problem that is an emergency. Do not wait to see if the symptoms will go away. Get medical help right away. Call your local emergency services (911 in the [place] Do not drive yourself to the hospital. Summary Temporal arteritis is a condition that causes arteries to become inflamed. It usually affects arteries in your head and face. This condition can cause serious problems, such as blindness. Treatment can help prevent these problems. Symptoms may include hard or tender temples, pain in your jaw when chewing, problems with your vision, or pain in your hips and shoulders. Take over-the-counter and prescription medicines as told by your health care provider. This information is not intended to replace advice given to you by your health care provider. Make sure you discuss any questions you have with your health care provider. Document Revised: 12/29/2020 Document Reviewed: 12/29/2020 Elsevier Patient Education  [employer].

## 2021-12-27 NOTE — Assessment & Plan Note (Signed)
New L severe temple HA 7-8/10 x 3 wks - constant: taking Tramadol, Advil; weakness, cold fingers, feet. R/o giant cell arteritis on the L Start empiric steroids Gen Surgery and Ophthalmology ref    Potential benefits of a short and long term steroid  use as well as potential risks  and complications were explained to the patient and were aknowledged.

## 2021-12-28 LAB — COMPREHENSIVE METABOLIC PANEL
ALT: 17 U/L (ref 0–53)
AST: 15 U/L (ref 0–37)
Albumin: 4 g/dL (ref 3.5–5.2)
Alkaline Phosphatase: 55 U/L (ref 39–117)
BUN: 38 mg/dL — ABNORMAL HIGH (ref 6–23)
CO2: 29 mEq/L (ref 19–32)
Calcium: 9.8 mg/dL (ref 8.4–10.5)
Chloride: 102 mEq/L (ref 96–112)
Creatinine, Ser: 2.09 mg/dL — ABNORMAL HIGH (ref 0.40–1.50)
GFR: 29.4 mL/min — ABNORMAL LOW (ref >60.00)
Glucose, Bld: 162 mg/dL — ABNORMAL HIGH (ref 70–99)
Potassium: 5.4 mEq/L — ABNORMAL HIGH (ref 3.5–5.1)
Sodium: 138 mEq/L (ref 135–145)
Total Bilirubin: 0.7 mg/dL (ref 0.2–1.2)
Total Protein: 6.5 g/dL (ref 6.0–8.3)

## 2021-12-28 LAB — SEDIMENTATION RATE: Sed Rate: 9 mm/hr (ref 0–20)

## 2021-12-28 LAB — CBC WITH DIFFERENTIAL/PLATELET
Basophils Absolute: 0 10*3/uL (ref 0.0–0.1)
Basophils Relative: 0.2 % (ref 0.0–3.0)
Eosinophils Absolute: 0 10*3/uL (ref 0.0–0.7)
Eosinophils Relative: 0.1 % (ref 0.0–5.0)
HCT: 43.3 % (ref 39.0–52.0)
Hemoglobin: 14.2 g/dL (ref 13.0–17.0)
Lymphocytes Relative: 6.8 % — ABNORMAL LOW (ref 12.0–46.0)
Lymphs Abs: 0.4 10*3/uL — ABNORMAL LOW (ref 0.7–4.0)
MCHC: 32.8 g/dL (ref 30.0–36.0)
MCV: 89.8 fl (ref 78.0–100.0)
Monocytes Absolute: 0.1 10*3/uL (ref 0.1–1.0)
Monocytes Relative: 2 % — ABNORMAL LOW (ref 3.0–12.0)
Neutro Abs: 6 10*3/uL (ref 1.4–7.7)
Neutrophils Relative %: 90.9 % — ABNORMAL HIGH (ref 43.0–77.0)
Platelets: 68 10*3/uL — ABNORMAL LOW (ref 150.0–400.0)
RBC: 4.82 Mil/uL (ref 4.22–5.81)
RDW: 15.5 % (ref 11.5–15.5)
WBC: 6.6 10*3/uL (ref 4.0–10.5)

## 2021-12-28 LAB — HEMOGLOBIN A1C: Hgb A1c MFr Bld: 6 % (ref 4.6–6.5)

## 2022-01-05 ENCOUNTER — Encounter: Payer: Self-pay | Admitting: Internal Medicine

## 2022-01-05 ENCOUNTER — Other Ambulatory Visit: Payer: Self-pay

## 2022-01-05 ENCOUNTER — Ambulatory Visit (INDEPENDENT_AMBULATORY_CARE_PROVIDER_SITE_OTHER): Payer: PPO | Admitting: Internal Medicine

## 2022-01-05 DIAGNOSIS — G4700 Insomnia, unspecified: Secondary | ICD-10-CM | POA: Diagnosis not present

## 2022-01-05 DIAGNOSIS — N1832 Chronic kidney disease, stage 3b: Secondary | ICD-10-CM | POA: Diagnosis not present

## 2022-01-05 DIAGNOSIS — H9313 Tinnitus, bilateral: Secondary | ICD-10-CM

## 2022-01-05 DIAGNOSIS — G43909 Migraine, unspecified, not intractable, without status migrainosus: Secondary | ICD-10-CM

## 2022-01-05 NOTE — Assessment & Plan Note (Addendum)
Gen Surgery and Ophthalmology appt is pending ?L sided HA - >80-90% better on Prednisone ?We will continue to treat with prednisone for presumed diagnosis of giant cell arteritis.  No vision changes ?

## 2022-01-05 NOTE — Assessment & Plan Note (Signed)
Clonazepam at hs qd or  qod ? Potential benefits of a long term benzodiazepines  use as well as potential risks  and complications were explained to the patient and were aknowledged. ?

## 2022-01-05 NOTE — Progress Notes (Signed)
Subjective:  Patient ID: Scott Oneal, male    DOB: February 01, 1941  Age: 81 y.o. MRN: [id number]  CC: Follow-up (Pt has still not heard back about biopsy, would like to discuss)   HPI Scott Oneal presents for L sided HA - >80-90% better on Prednisone  No vision change  F/u HTN.  Scott Oneal is complaining of tinnitus  Outpatient Medications Prior to Visit  Medication Sig Dispense Refill   calcitRIOL (ROCALTROL) 0.25 MCG capsule TAKE 1 CAPSULE BY MOUTH EVERY DAY 90 capsule 1   carvedilol (COREG) 25 MG tablet TAKE 1 TABLET BY MOUTH TWICE A DAY WITH MEALS 180 tablet 3   cetirizine (ZYRTEC) 10 MG tablet Take 10 mg by mouth daily as needed for allergies.     Cholecalciferol 2000 UNITS TABS Take 2,000 Units by mouth daily.     clonazePAM (KLONOPIN) 0.25 MG disintegrating tablet TAKE 1 TABLET (0.25 MG TOTAL) BY MOUTH 2 (TWO) TIMES DAILY AS NEEDED (ANXIETY). 60 tablet 3   diclofenac sodium (VOLTAREN) 1 % GEL APPLY 4 G TOPICALLY 4 TIMES DAILY. (Patient taking differently: Apply 1 application. topically 4 (four) times daily as needed (pain).) 200 g 5   hydrALAZINE (APRESOLINE) 10 MG tablet Take 1-2 tablet 3 times a day as needed if blood pressure is >170 60 tablet 3   isosorbide mononitrate (IMDUR) 30 MG 24 hr tablet Take 1 tablet (30 mg total) by mouth daily. 90 tablet 2   levothyroxine (SYNTHROID) 175 MCG tablet Take 1 tablet (175 mcg total) by mouth daily. 90 tablet 3   lipase/protease/amylase (CREON) 36000 UNITS CPEP capsule Take 2 capsules (72,000 Units total) by mouth 3 (three) times daily with meals. May also take 1 capsule (36,000 Units total) as needed (with snacks). 240 capsule 11   Multiple Vitamins-Minerals (PRESERVISION AREDS 2) CAPS Take 1 capsule by mouth in the morning and at bedtime.     mupirocin ointment (BACTROBAN) 2 % Apply 1 application topically 2 (two) times daily. Apply to wounds, cuts to prevent infection 22 g 0   nitroGLYCERIN (NITROSTAT) 0.4 MG SL tablet Place 0.4 mg under  the tongue every 5 (five) minutes as needed for chest pain.     olmesartan (BENICAR) 5 MG tablet TAKE 1 TABLET BY MOUTH EVERY DAY 90 tablet 3   predniSONE (DELTASONE) 10 MG tablet Take 4 tablets (40 mg total) by mouth daily with breakfast. 120 tablet 3   Propylene Glycol (SYSTANE BALANCE) 0.6 % SOLN Place 1 drop into both eyes daily as needed (Dry eye). systane balance     simvastatin (ZOCOR) 10 MG tablet TAKE 1 TABLET BY MOUTH EVERYDAY AT BEDTIME 90 tablet 3   SYRINGE-NEEDLE, DISP, 3 ML (BD ECLIPSE SYRINGE) 23G X 1" 3 ML MISC As directed IM 50 each 2   tamsulosin (FLOMAX) 0.4 MG CAPS capsule Take 0.4 mg by mouth daily.     testosterone cypionate (DEPOTESTOSTERONE CYPIONATE) 200 MG/ML injection INJECT 2ML INTO MUSCLE EVERY 2 WEEKS 12 mL 1   UNABLE TO FIND Med Name: [name] take 2 tablets by mouth daily     No facility-administered medications prior to visit.    ROS: Review of Systems  Constitutional:  Negative for appetite change, fatigue and unexpected weight change.  HENT:  Negative for congestion, nosebleeds, sneezing, sore throat and trouble swallowing.   Eyes:  Negative for itching and visual disturbance.  Respiratory:  Negative for cough.   Cardiovascular:  Negative for chest pain, palpitations and leg swelling.  Gastrointestinal:  Negative for abdominal distention, blood in stool, diarrhea and nausea.  Genitourinary:  Negative for frequency and hematuria.  Musculoskeletal:  Negative for back pain, gait problem, joint swelling and neck pain.  Skin:  Negative for rash.  Neurological:  Positive for headaches. Negative for dizziness, tremors, speech difficulty and weakness.  Psychiatric/Behavioral:  Negative for agitation, dysphoric mood, sleep disturbance and suicidal ideas. The patient is not nervous/anxious.    Objective:  BP 132/76 (BP Location: Left Arm, Patient Position: Sitting, Cuff Size: Large)    Pulse 61    Temp 98.1 F (36.7 C) (Oral)    Ht 6' (1.829 m)    Wt 188 lb 3.2  oz (85.4 kg)    SpO2 98%    BMI 25.52 kg/m   BP Readings from Last 3 Encounters:  01/05/22 132/76  12/27/21 120/68  12/13/21 (!) 154/81    Wt Readings from Last 3 Encounters:  01/05/22 188 lb 3.2 oz (85.4 kg)  12/27/21 187 lb (84.8 kg)  12/13/21 186 lb (84.4 kg)    Physical Exam Constitutional:      General: He is not in acute distress.    Appearance: Normal appearance. He is well-developed.     Comments: NAD  Eyes:     Conjunctiva/sclera: Conjunctivae normal.     Pupils: Pupils are equal, round, and reactive to light.  Neck:     Thyroid: No thyromegaly.     Vascular: No JVD.  Cardiovascular:     Rate and Rhythm: Normal rate and regular rhythm.     Heart sounds: Normal heart sounds. No murmur heard.   No friction rub. No gallop.  Pulmonary:     Effort: Pulmonary effort is normal. No respiratory distress.     Breath sounds: Normal breath sounds. No wheezing or rales.  Chest:     Chest wall: No tenderness.  Abdominal:     General: Bowel sounds are normal. There is no distension.     Palpations: Abdomen is soft. There is no mass.     Tenderness: There is no abdominal tenderness. There is no guarding or rebound.  Musculoskeletal:        General: No tenderness. Normal range of motion.     Cervical back: Normal range of motion.  Lymphadenopathy:     Cervical: No cervical adenopathy.  Skin:    General: Skin is warm and dry.     Findings: No rash.  Neurological:     Mental Status: He is alert and oriented to person, place, and time.     Cranial Nerves: No cranial nerve deficit.     Motor: No abnormal muscle tone.     Coordination: Coordination normal.     Gait: Gait normal.     Deep Tendon Reflexes: Reflexes are normal and symmetric.  Psychiatric:        Behavior: Behavior normal.        Thought Content: Thought content normal.        Judgment: Judgment normal.  Scalp is NT  Lab Results  Component Value Date   WBC 6.6 12/28/2021   HGB 14.2 12/28/2021   HCT 43.3  12/28/2021   PLT 68.0 (L) 12/28/2021   GLUCOSE 162 (H) 12/28/2021   CHOL 115 02/18/2020   TRIG 115 02/18/2020   HDL 34 (L) 02/18/2020   LDLCALC 60 02/18/2020   ALT 17 12/28/2021   AST 15 12/28/2021   NA 138 12/28/2021   K 5.4 (H) 12/28/2021   CL 102 12/28/2021  CREATININE 2.09 (H) 12/28/2021   BUN 38 (H) 12/28/2021   CO2 29 12/28/2021   TSH 11.04 (H) 04/15/2021   PSA 2.08 08/24/2020   INR 1.1 06/21/2021   HGBA1C 6.0 12/28/2021   MICROALBUR 1.2 05/18/2017    DG C-Arm 1-60 Min-No Report  Result Date: 06/21/2021 Fluoroscopy was utilized by the requesting physician.  No radiographic interpretation.   IR URETERAL STENT LEFT NEW ACCESS W/O SEP NEPHROSTOMY CATH  Result Date: 06/21/2021 INDICATION: 81 year old male with left-sided nephrolithiasis referred for percutaneous access for pending nephrolithotomy EXAM: IR URETURAL STENT LEFT NEW ACCESS W/O SEP NEPHROSTOMY CATH COMPARISON:  None. MEDICATIONS: 2 g Rocephin; The antibiotic was administered in an appropriate time frame prior to skin puncture. ANESTHESIA/SEDATION: Fentanyl 100 mcg IV; Versed 2.0 mg IV Moderate Sedation Time:  10 minutes The patient was continuously monitored during the procedure by the interventional radiology nurse under my direct supervision. CONTRAST:  35m OMNIPAQUE IOHEXOL 300 MG/ML SOLN - administered into the collecting system(s) FLUOROSCOPY TIME:  Fluoroscopy Time: 0 minutes 54 seconds (9 mGy). COMPLICATIONS: None PROCEDURE: Informed written consent was obtained from the patient after a thorough discussion of the procedural risks, benefits and alternatives. All questions were addressed. Maximal Sterile Barrier Technique was utilized including caps, mask, sterile gowns, sterile gloves, sterile drape, hand hygiene and skin antiseptic. A timeout was performed prior to the initiation of the procedure. The procedure, risks, benefits, and alternatives were explained to the patient, including the possibility of failure.  Questions regarding the procedure were encouraged and answered. The patient was positioned prone on the fluoroscopy table. The left flank was then prepped with chlorhexidine in the usual sterile fashion, and a sterile drape was applied covering the operative field. A sterile gown and sterile gloves were used for the procedure. Local anesthesia was provided with 1% Lidocaine. Scout images of the left flank were performed. Ultrasound was performed with images stored sent to PACs. The stone within the collecting system was targeted with fluoroscopy. 1% lidocaine was used for local anesthesia. 2Corralitosneedle was advanced under fluoroscopy targeting the stone in the collecting system. Once the needle was adjacent to the stone, contrast was infused for opacification of the collecting system. Stone existed within a lower pole, posterior calyx, thus this seemed a reasonable access point for placement of the nephroureteral drain. 018 wire was advanced into the collecting system. Wire was advanced beyond the collecting system into the ureter. Envi mini stick set was then advanced into the ureter and the inner dilator was removed. Contrast injection confirmed location in the ureter. Bentson wire was placed into the ureter, and advanced into the urinary bladder. Four French sheath was removed and a 5 [place]65 cm Kumpe the catheter advanced into the ureter. The Kumpe the catheter was then navigated into the urinary bladder. Catheter was curled in the urinary bladder. Wire was removed. Final images were performed. Catheter was sutured in position. The patient tolerated procedure well and remained hemodynamically stable throughout. No complications were encountered and no significant blood loss was encountered. IMPRESSION: Status post image guided placement of left-sided nephroureteral drain, for pending nephrolithotomy in the operating room. Signed, JDulcy Fanny WDellia Nims RPVI [facility] Electronically Signed   By: JCorrie MckusickD.O.   On: 06/21/2021 10:19    Assessment & Plan:   Problem List Items Addressed This Visit     CKD (chronic kidney disease) stage 3, GFR 30-59 ml/min (HCC)    Cont  to monitor GFR      Hemicrania    Gen Surgery and Ophthalmology appt is pending L sided HA - >80-90% better on Prednisone We will continue to treat with prednisone for presumed diagnosis of giant cell arteritis.  No vision changes      Insomnia    Clonazepam at hs qd or  qod  Potential benefits of a long term benzodiazepines  use as well as potential risks  and complications were explained to the patient and were aknowledged.      Tinnitus    Clonazepam at hs qd or  qod  Potential benefits of a long term benzodiazepines  use as well as potential risks  and complications were explained to the patient and were aknowledged.          Follow-up: Return in about 4 weeks (around 02/02/2022) for a follow-up visit.  Walker Kehr, MD

## 2022-01-05 NOTE — Assessment & Plan Note (Signed)
Cont to monitor GFR ?

## 2022-01-12 ENCOUNTER — Telehealth: Payer: Self-pay | Admitting: Internal Medicine

## 2022-01-12 NOTE — Telephone Encounter (Signed)
Rep w/ [employer] requesting PA for testosterone cypionate (DEPOTESTOSTERONE CYPIONATE) 200 MG/ML injection on pts behalf ?

## 2022-01-12 NOTE — Telephone Encounter (Signed)
PA started  ? ?Key: [id number] ?

## 2022-01-13 ENCOUNTER — Ambulatory Visit: Payer: PPO | Admitting: Internal Medicine

## 2022-01-14 ENCOUNTER — Other Ambulatory Visit: Payer: Self-pay | Admitting: Internal Medicine

## 2022-02-02 ENCOUNTER — Ambulatory Visit: Payer: PPO | Admitting: Internal Medicine

## 2022-02-03 DIAGNOSIS — M316 Other giant cell arteritis: Secondary | ICD-10-CM | POA: Diagnosis not present

## 2022-02-08 ENCOUNTER — Encounter: Payer: Self-pay | Admitting: Internal Medicine

## 2022-02-08 ENCOUNTER — Other Ambulatory Visit: Payer: Self-pay | Admitting: *Deleted

## 2022-02-08 ENCOUNTER — Ambulatory Visit (INDEPENDENT_AMBULATORY_CARE_PROVIDER_SITE_OTHER): Payer: PPO | Admitting: Internal Medicine

## 2022-02-08 DIAGNOSIS — E291 Testicular hypofunction: Secondary | ICD-10-CM

## 2022-02-08 DIAGNOSIS — G4489 Other headache syndrome: Secondary | ICD-10-CM | POA: Diagnosis not present

## 2022-02-08 DIAGNOSIS — G43909 Migraine, unspecified, not intractable, without status migrainosus: Secondary | ICD-10-CM | POA: Diagnosis not present

## 2022-02-08 NOTE — Progress Notes (Signed)
? ?Subjective:  ?Patient ID: Scott Oneal, male    DOB: [birth year]  Age: 81 y.o. MRN: [id number] ? ?CC: No chief complaint on file. ? ? ?HPI ?Scott Oneal presents for possible GCA - surgery bx w/Dr Kieth Brightly pending ?Eye appt tomorrow ?C/o tremor, anxiety, sweats ? ?Outpatient Medications Prior to Visit  ?Medication Sig Dispense Refill  ? calcitRIOL (ROCALTROL) 0.25 MCG capsule TAKE 1 CAPSULE BY MOUTH EVERY DAY 90 capsule 1  ? carvedilol (COREG) 25 MG tablet TAKE 1 TABLET BY MOUTH TWICE A DAY WITH MEALS 180 tablet 1  ? cetirizine (ZYRTEC) 10 MG tablet Take 10 mg by mouth daily as needed for allergies.    ? Cholecalciferol 2000 UNITS TABS Take 2,000 Units by mouth daily.    ? clonazePAM (KLONOPIN) 0.25 MG disintegrating tablet TAKE 1 TABLET (0.25 MG TOTAL) BY MOUTH 2 (TWO) TIMES DAILY AS NEEDED (ANXIETY). 60 tablet 3  ? diclofenac sodium (VOLTAREN) 1 % GEL APPLY 4 G TOPICALLY 4 TIMES DAILY. (Patient taking differently: Apply 1 application. topically 4 (four) times daily as needed (pain).) 200 g 5  ? hydrALAZINE (APRESOLINE) 10 MG tablet Take 1-2 tablet 3 times a day as needed if blood pressure is >170 60 tablet 3  ? isosorbide mononitrate (IMDUR) 30 MG 24 hr tablet Take 1 tablet (30 mg total) by mouth daily. 90 tablet 2  ? levothyroxine (SYNTHROID) 175 MCG tablet Take 1 tablet (175 mcg total) by mouth daily. 90 tablet 3  ? lipase/protease/amylase (CREON) 36000 UNITS CPEP capsule Take 2 capsules (72,000 Units total) by mouth 3 (three) times daily with meals. May also take 1 capsule (36,000 Units total) as needed (with snacks). 240 capsule 11  ? Multiple Vitamins-Minerals (PRESERVISION AREDS 2) CAPS Take 1 capsule by mouth in the morning and at bedtime.    ? mupirocin ointment (BACTROBAN) 2 % Apply 1 application topically 2 (two) times daily. Apply to wounds, cuts to prevent infection 22 g 0  ? nitroGLYCERIN (NITROSTAT) 0.4 MG SL tablet Place 0.4 mg under the tongue every 5 (five) minutes as needed for chest pain.     ? olmesartan (BENICAR) 5 MG tablet TAKE 1 TABLET BY MOUTH EVERY DAY 90 tablet 3  ? predniSONE (DELTASONE) 10 MG tablet Take 4 tablets (40 mg total) by mouth daily with breakfast. 120 tablet 3  ? Propylene Glycol (SYSTANE BALANCE) 0.6 % SOLN Place 1 drop into both eyes daily as needed (Dry eye). systane balance    ? simvastatin (ZOCOR) 10 MG tablet TAKE 1 TABLET BY MOUTH EVERYDAY AT BEDTIME 90 tablet 3  ? SYRINGE-NEEDLE, DISP, 3 ML (BD ECLIPSE SYRINGE) 23G X 1" 3 ML MISC As directed IM 50 each 2  ? tamsulosin (FLOMAX) 0.4 MG CAPS capsule Take 0.4 mg by mouth daily.    ? testosterone cypionate (DEPOTESTOSTERONE CYPIONATE) 200 MG/ML injection INJECT 2ML INTO MUSCLE EVERY 2 WEEKS 12 mL 1  ? UNABLE TO FIND Med Name: [name] take 2 tablets by mouth daily    ? ?No facility-administered medications prior to visit.  ? ? ?ROS: ?Review of Systems  ?Constitutional:  Positive for diaphoresis and unexpected weight change. Negative for appetite change and fatigue.  ?HENT:  Negative for congestion, nosebleeds, sneezing, sore throat and trouble swallowing.   ?Eyes:  Positive for visual disturbance. Negative for itching.  ?Respiratory:  Negative for cough.   ?Cardiovascular:  Negative for chest pain, palpitations and leg swelling.  ?Gastrointestinal:  Negative for abdominal distention, blood in stool, diarrhea  and nausea.  ?Genitourinary:  Negative for frequency and hematuria.  ?Musculoskeletal:  Negative for back pain, gait problem, joint swelling and neck pain.  ?Skin:  Negative for rash.  ?Neurological:  Negative for dizziness, tremors, speech difficulty, weakness and headaches.  ?Psychiatric/Behavioral:  Negative for agitation, dysphoric mood and sleep disturbance. The patient is not nervous/anxious.   ? ?Objective:  ?BP 132/70 (BP Location: Left Arm, Patient Position: Sitting, Cuff Size: Large)   Pulse 94   Temp 98.7 ?F (37.1 ?C) (Oral)   Ht 6' (1.829 m)   Wt 193 lb (87.5 kg)   SpO2 98%   BMI 26.18 kg/m?  ? ?BP Readings  from Last 3 Encounters:  ?02/08/22 132/70  ?01/05/22 132/76  ?12/27/21 120/68  ? ? ?Wt Readings from Last 3 Encounters:  ?02/08/22 193 lb (87.5 kg)  ?01/05/22 188 lb 3.2 oz (85.4 kg)  ?12/27/21 187 lb (84.8 kg)  ? ? ?Physical Exam ?Constitutional:   ?   General: He is not in acute distress. ?   Appearance: He is well-developed.  ?   Comments: NAD  ?Eyes:  ?   Conjunctiva/sclera: Conjunctivae normal.  ?   Pupils: Pupils are equal, round, and reactive to light.  ?Neck:  ?   Thyroid: No thyromegaly.  ?   Vascular: No JVD.  ?Cardiovascular:  ?   Rate and Rhythm: Normal rate and regular rhythm.  ?   Heart sounds: Normal heart sounds. No murmur heard. ?  No friction rub. No gallop.  ?Pulmonary:  ?   Effort: Pulmonary effort is normal. No respiratory distress.  ?   Breath sounds: Normal breath sounds. No wheezing or rales.  ?Chest:  ?   Chest wall: No tenderness.  ?Abdominal:  ?   General: Bowel sounds are normal. There is no distension.  ?   Palpations: Abdomen is soft. There is no mass.  ?   Tenderness: There is no abdominal tenderness. There is no guarding or rebound.  ?Musculoskeletal:     ?   General: No tenderness. Normal range of motion.  ?   Cervical back: Normal range of motion.  ?Lymphadenopathy:  ?   Cervical: No cervical adenopathy.  ?Skin: ?   General: Skin is warm and dry.  ?   Findings: No rash.  ?Neurological:  ?   Mental Status: He is alert and oriented to person, place, and time.  ?   Cranial Nerves: No cranial nerve deficit.  ?   Motor: No abnormal muscle tone.  ?   Coordination: Coordination normal.  ?   Gait: Gait normal.  ?   Deep Tendon Reflexes: Reflexes are normal and symmetric.  ?Psychiatric:     ?   Behavior: Behavior normal.     ?   Thought Content: Thought content normal.     ?   Judgment: Judgment normal.  ?Scalp NT B ? ?Lab Results  ?Component Value Date  ? WBC 6.6 12/28/2021  ? HGB 14.2 12/28/2021  ? HCT 43.3 12/28/2021  ? PLT 68.0 (L) 12/28/2021  ? GLUCOSE 162 (H) 12/28/2021  ? CHOL 115  02/18/2020  ? TRIG 115 02/18/2020  ? HDL 34 (L) 02/18/2020  ? [place] 60 02/18/2020  ? ALT 17 12/28/2021  ? AST 15 12/28/2021  ? NA 138 12/28/2021  ? K 5.4 (H) 12/28/2021  ? CL 102 12/28/2021  ? CREATININE 2.09 (H) 12/28/2021  ? BUN 38 (H) 12/28/2021  ? CO2 29 12/28/2021  ? TSH 11.04 (H) 04/15/2021  ?  PSA 2.08 08/24/2020  ? INR 1.1 06/21/2021  ? HGBA1C 6.0 12/28/2021  ? MICROALBUR 1.2 05/18/2017  ? ? ?DG C-Arm 1-60 Min-No Report ? ?Result Date: 06/21/2021 ?Fluoroscopy was utilized by the requesting physician.  No radiographic interpretation.  ? ?IR URETERAL STENT LEFT NEW ACCESS W/O SEP NEPHROSTOMY CATH ? ?Result Date: 06/21/2021 ?INDICATION: 81 year old male with left-sided nephrolithiasis referred for percutaneous access for pending nephrolithotomy EXAM: IR URETURAL STENT LEFT NEW ACCESS W/O SEP NEPHROSTOMY CATH COMPARISON:  None. MEDICATIONS: 2 g Rocephin; The antibiotic was administered in an appropriate time frame prior to skin puncture. ANESTHESIA/SEDATION: Fentanyl 100 mcg IV; Versed 2.0 mg IV Moderate Sedation Time:  10 minutes The patient was continuously monitored during the procedure by the interventional radiology nurse under my direct supervision. CONTRAST:  26m OMNIPAQUE IOHEXOL 300 MG/ML SOLN - administered into the collecting system(s) FLUOROSCOPY TIME:  Fluoroscopy Time: 0 minutes 54 seconds (9 mGy). COMPLICATIONS: None PROCEDURE: Informed written consent was obtained from the patient after a thorough discussion of the procedural risks, benefits and alternatives. All questions were addressed. Maximal Sterile Barrier Technique was utilized including caps, mask, sterile gowns, sterile gloves, sterile drape, hand hygiene and skin antiseptic. A timeout was performed prior to the initiation of the procedure. The procedure, risks, benefits, and alternatives were explained to the patient, including the possibility of failure. Questions regarding the procedure were encouraged and answered. The patient was  positioned prone on the fluoroscopy table. The left flank was then prepped with chlorhexidine in the usual sterile fashion, and a sterile drape was applied covering the operative field. A sterile gown and ster

## 2022-02-08 NOTE — Assessment & Plan Note (Signed)
Possible GCA - surgery bx w/Dr Kinsinger pending ?Eye appt tomorrow ?C/o tremor, anxiety, sweats due to steroids ?

## 2022-02-08 NOTE — Assessment & Plan Note (Signed)
On Testosterone ?Needs a PA ?

## 2022-02-08 NOTE — Telephone Encounter (Signed)
Pt had visit today needing PA for Testosterone Cypionate 200 mg.. Completed PA via cover-my-meds w/ (Key: [id number]). PA would not go through it states " There was an error with your request ' . Called # on form [phone] spoke w/ Alfonse Spruce gave p;t information. Per Alfonse Spruce med was denied due to pt plan. Insurance alternatives are andro derm & depo-testosterone (generic brands)../l,b ? ?

## 2022-02-09 DIAGNOSIS — H353211 Exudative age-related macular degeneration, right eye, with active choroidal neovascularization: Secondary | ICD-10-CM | POA: Diagnosis not present

## 2022-02-09 DIAGNOSIS — H353122 Nonexudative age-related macular degeneration, left eye, intermediate dry stage: Secondary | ICD-10-CM | POA: Diagnosis not present

## 2022-02-09 DIAGNOSIS — H04123 Dry eye syndrome of bilateral lacrimal glands: Secondary | ICD-10-CM | POA: Diagnosis not present

## 2022-02-09 NOTE — Patient Instructions (Addendum)
DUE TO COVID-19 ONLY TWO VISITORS  (aged 81 and older)  ARE ALLOWED TO COME WITH YOU AND STAY IN THE WAITING ROOM ONLY DURING PRE OP AND PROCEDURE.   ?**NO VISITORS ARE ALLOWED IN THE SHORT STAY AREA OR RECOVERY ROOM!!** ? ?You are not required to quarantine, however you are required to wear a well-fitted mask when you are out and around people not in your household.  ?Hand Hygiene often ?Do NOT share personal items ?Notify your provider if you are in close contact with someone who has COVID or you develop fever 100.4 or greater, new onset of sneezing, cough, sore throat, shortness of breath or body aches. ? ?     ? Your procedure is scheduled on:  02-18-22 ? ? Report to [facility] Main Entrance ? ?  Report to admitting at 5:15 AM ? ? Call this number if you have problems the morning of surgery [phone] ? ? Do not eat food :After Midnight. ? ? After Midnight you may have the following liquids until 4:30 AM DAY OF SURGERY ? ?Water ?Black Coffee (sugar ok, NO MILK/CREAM OR CREAMERS)  ?Tea (sugar ok, NO MILK/CREAM OR CREAMERS) regular and decaf                             ?Plain Jell-O (NO RED)                                           ?Fruit ices (not with fruit pulp, NO RED)                                     ?Popsicles (NO RED)                                                                  ?Juice: apple, WHITE grape, WHITE cranberry ?Sports drinks like Gatorade (NO RED) ?Clear broth(vegetable,chicken,beef) ? ?  ?  ?Oral Hygiene is also important to reduce your risk of infection.                                    ?Remember - BRUSH YOUR TEETH THE MORNING OF SURGERY WITH YOUR REGULAR TOOTHPASTE ? ? Do NOT smoke after Midnight ? ?Take these medicines the morning of surgery with A SIP OF WATER:  Carvedilol, Zyrtec, Clonazepam, Hydralazine, Isosorbide, Levothyroxine, Tamsulosin ?                  ?           You may not have any metal on your body including jewelry, and body piercing ? ?           Do not  wear lotions, powders, cologne, or deodorant ? ?            Men may shave face and neck. ? ? Do not bring valuables to the hospital. [place]. ? ?  Contacts, dentures or bridgework may not be worn into surgery. ?  ?Patients discharged on the day of surgery will not be allowed to drive home.  Someone NEEDS to stay with you for the first 24 hours after anesthesia. ? ?Special Instructions: Bring a copy of your healthcare power of attorney and living will documents  the day of surgery if you haven't scanned them before. ? ?Please read over the following fact sheets you were given: IF [place] [phone] ? ?[facility] - Preparing for Surgery ?Before surgery, you can play an important role.  Because skin is not sterile, your skin needs to be as free of germs as possible.  You can reduce the number of germs on your skin by washing with CHG (chlorahexidine gluconate) soap before surgery.  CHG is an antiseptic cleaner which kills germs and bonds with the skin to continue killing germs even after washing. ?Please DO NOT use if you have an allergy to CHG or antibacterial soaps.  If your skin becomes reddened/irritated stop using the CHG and inform your nurse when you arrive at Short Stay.  You may shave your face/neck. ? ?Please follow these instructions carefully: ? 1.  Shower with CHG Soap the night before surgery and the  morning of surgery. ? 2.  If you choose to wash your hair, wash your hair first as usual with your normal  shampoo. ? 3.  After you shampoo, rinse your hair and body thoroughly to remove the shampoo.                            ? 4.  Use CHG as you would any other liquid soap.  You can apply chg directly to the skin and wash.  Gently with a scrungie or clean washcloth. ? 5.  Apply the CHG Soap to your body ONLY FROM THE NECK DOWN.   Do not use on face/ open      ?                     Wound or open sores. Avoid  contact with eyes, ears mouth and genitals (private parts).  ?                     Production manager,  Genitals (private parts) with your normal soap. ?            6.  Wash thoroughly, paying special attention to the area where your surgery  will be performed. ? 7.  Thoroughly rinse your body with warm water from the neck down. ? 8.  DO NOT shower/wash with your normal soap after using and rinsing off the CHG Soap. ?               9.  Pat yourself dry with a clean towel. ?           10.  Wear clean pajamas. ?           11.  Place clean sheets on your bed the night of your first shower and do not  sleep with pets. ?Day of Surgery : ?Do not apply any lotions/deodorants the morning of surgery.  Please wear clean clothes to the hospital/surgery center. ? ?FAILURE TO FOLLOW THESE INSTRUCTIONS MAY RESULT IN THE CANCELLATION OF YOUR SURGERY ? ?PATIENT SIGNATURE_________________________________ ? ?NURSE SIGNATURE__________________________________ ? ?________________________________________________________________________  ?  ?

## 2022-02-09 NOTE — Progress Notes (Signed)
Surgery orders requested via Epic inbox. °

## 2022-02-10 ENCOUNTER — Encounter (HOSPITAL_COMMUNITY): Payer: Self-pay

## 2022-02-10 ENCOUNTER — Encounter (HOSPITAL_COMMUNITY)
Admission: RE | Admit: 2022-02-10 | Discharge: 2022-02-10 | Disposition: A | Discharge status: Home / Self Care | Payer: PPO | Source: Ambulatory Visit | Attending: General Surgery | Admitting: General Surgery

## 2022-02-10 ENCOUNTER — Other Ambulatory Visit: Payer: Self-pay

## 2022-02-10 VITALS — BP 134/74 | HR 57 | Temp 98.0°F | Resp 18 | Ht 72.0 in | Wt 190.1 lb

## 2022-02-10 DIAGNOSIS — K8689 Other specified diseases of pancreas: Secondary | ICD-10-CM | POA: Diagnosis not present

## 2022-02-10 DIAGNOSIS — N183 Chronic kidney disease, stage 3 unspecified: Secondary | ICD-10-CM | POA: Diagnosis not present

## 2022-02-10 DIAGNOSIS — K639 Disease of intestine, unspecified: Secondary | ICD-10-CM | POA: Insufficient documentation

## 2022-02-10 DIAGNOSIS — Z7952 Long term (current) use of systemic steroids: Secondary | ICD-10-CM | POA: Insufficient documentation

## 2022-02-10 DIAGNOSIS — M316 Other giant cell arteritis: Secondary | ICD-10-CM | POA: Diagnosis not present

## 2022-02-10 DIAGNOSIS — D696 Thrombocytopenia, unspecified: Secondary | ICD-10-CM | POA: Diagnosis not present

## 2022-02-10 DIAGNOSIS — I252 Old myocardial infarction: Secondary | ICD-10-CM | POA: Diagnosis not present

## 2022-02-10 DIAGNOSIS — I2510 Atherosclerotic heart disease of native coronary artery without angina pectoris: Secondary | ICD-10-CM | POA: Diagnosis not present

## 2022-02-10 DIAGNOSIS — Z79899 Other long term (current) drug therapy: Secondary | ICD-10-CM | POA: Diagnosis not present

## 2022-02-10 DIAGNOSIS — J45909 Unspecified asthma, uncomplicated: Secondary | ICD-10-CM | POA: Insufficient documentation

## 2022-02-10 DIAGNOSIS — Z955 Presence of coronary angioplasty implant and graft: Secondary | ICD-10-CM | POA: Insufficient documentation

## 2022-02-10 DIAGNOSIS — I1310 Hypertensive heart and chronic kidney disease without heart failure, with stage 1 through stage 4 chronic kidney disease, or unspecified chronic kidney disease: Secondary | ICD-10-CM | POA: Diagnosis not present

## 2022-02-10 DIAGNOSIS — Z01818 Encounter for other preprocedural examination: Secondary | ICD-10-CM | POA: Insufficient documentation

## 2022-02-10 HISTORY — DX: Personal history of urinary calculi: Z87.442

## 2022-02-10 LAB — BASIC METABOLIC PANEL
Anion gap: 4 — ABNORMAL LOW (ref 5–15)
BUN: 46 mg/dL — ABNORMAL HIGH (ref 8–23)
CO2: 31 mmol/L (ref 22–32)
Calcium: 8.4 mg/dL — ABNORMAL LOW (ref 8.9–10.3)
Chloride: 106 mmol/L (ref 98–111)
Creatinine, Ser: 1.63 mg/dL — ABNORMAL HIGH (ref 0.61–1.24)
GFR, Estimated: 42 mL/min — ABNORMAL LOW (ref >60)
Glucose, Bld: 111 mg/dL — ABNORMAL HIGH (ref 70–99)
Potassium: 4.3 mmol/L (ref 3.5–5.1)
Sodium: 141 mmol/L (ref 135–145)

## 2022-02-10 LAB — CBC
HCT: 47.7 % (ref 39.0–52.0)
Hemoglobin: 15.3 g/dL (ref 13.0–17.0)
MCH: 30.4 pg (ref 26.0–34.0)
MCHC: 32.1 g/dL (ref 30.0–36.0)
MCV: 94.6 fL (ref 80.0–100.0)
Platelets: 57 10*3/uL — ABNORMAL LOW (ref 150–400)
RBC: 5.04 MIL/uL (ref 4.22–5.81)
RDW: 16.8 % — ABNORMAL HIGH (ref 11.5–15.5)
WBC: 6.6 10*3/uL (ref 4.0–10.5)
nRBC: 0 % (ref 0.0–0.2)

## 2022-02-10 NOTE — Progress Notes (Signed)
Anesthesia note: ? ?Bowel prep reminder:NA ? ?PCP - Dr. Loni Muse. Plotnikov ?Cardiologist -Dr. Ezzie Dural ?Other-  ? ?Chest x-ray - no ?EKG - 02/10/22-chart ?Stress Test - no ?ECHO - 04/01/21-epic ?Cardiac Cath -  ? ?Pacemaker/ICD device last checked:NA ? ?Sleep Study -no ?CPAP -  ? ?Pt is pre diabetic-NA ?Fasting Blood Sugar -  ?Checks Blood Sugar _____ ? ?Blood Thinner:ASA 81 mg/ Dr. Alain Marion ?Blood Thinner Instructions: ?Aspirin Instructions:none- Pt will call his Dr. ?Last Dose: ? ?Anesthesia review: yes ? ?Patient denies shortness of breath, fever, cough and chest pain at PAT appointment ?Pt has no SOB . He stays active in the garden. On his Lt leg he has a small scrap and a bandaid ? ?Patient verbalized understanding of instructions that were given to them at the PAT appointment. Patient was also instructed that they will need to review over the PAT instructions again at home before surgery. yes ?

## 2022-02-11 ENCOUNTER — Ambulatory Visit: Payer: Self-pay | Admitting: General Surgery

## 2022-02-11 NOTE — Telephone Encounter (Signed)
Entered rx.. must be sent in by MD. Pls send to [facility]..../lb ?

## 2022-02-11 NOTE — Telephone Encounter (Signed)
Okay Depo testosterone generic.  I never requested the the brand name of Depo testosterone.  Thanks ?

## 2022-02-11 NOTE — Progress Notes (Signed)
Anesthesia Chart Review: ? ? Case: [id number] Date/Time: 02/18/22 0715  ? Procedure: LEFT TEMPORAL ARTERY BIOPSY (Left)  ? Anesthesia type: Choice  ? Pre-op diagnosis: GIANT CELL ARTERITIS  ? Location[facility] ROOM 02 / [facility]  ? Surgeons: Kinsinger, Arta Bruce, MD  ? ?  ? ? ?DISCUSSION: ?Pt is 81 years old with hx:  ?- CAD (overlapping DES to RCA, DES to CX in 2005) ?- HTN ?- asthma ?- CKD (stage 3) ?- chronic enteropathy  ?- pancreatic insufficiency ?- thrombocytopenia (this is documented in PCP notes dating back to 2009. I do not see a hematology work-up in Epic, but it may pre-date [place]'s use of Epic. PCP comments several times on low platelet lab results that the result is "stable") ?  ?- Hospitalized 6/1-5/22 at [facility] in [place], [place] for LLE cellulitis and sepsis.  ? ?- Platelets 57. Platelet counts range as high as 162 to as low at 69 over the last 10 years. Average platelet count appears to be ~ 90-100. I notified Abigail Butts in Dr. Amie Portland office of platelet result. Review platelet result/thrombocytopenia hx with Dr. Tobias Alexander.  ? ? ?VS: BP 134/74   Pulse (!) 57   Temp 36.7 ?C (Oral)   Resp 18   Ht 6' (1.829 m)   Wt 86.2 kg   SpO2 100%   BMI 25.79 kg/m?  ? ?PROVIDERS: ?- PCP is Plotnikov, Evie Lacks, MD ?- Cardiologist is Sherren Mocha, MD. Last office visit 06/07/21 ?- Nephrologist is Corliss Parish, MD ?- Seeing ID Alcide Evener, MD for cellulitis. Last office visit 12/13/21; no evidence of recurrent cellulitis ? ? ?LABS:  ?- Cr 1.63, BUN 46. This is improved from prior. Baseline Cr ~2 however it has been higher in the recent past ?- Platelets 57 ? ?(all labs ordered are listed, but only abnormal results are displayed) ? ?Labs Reviewed  ?BASIC METABOLIC PANEL - Abnormal; Notable for the following components:  ?    Result Value  ? Glucose, Bld 111 (*)   ? BUN 46 (*)   ? Creatinine, Ser 1.63 (*)   ? Calcium 8.4 (*)   ? GFR, Estimated 42 (*)   ? Anion gap 4 (*)   ? All other  components within normal limits  ?CBC - Abnormal; Notable for the following components:  ? RDW 16.8 (*)   ? Platelets 57 (*)   ? All other components within normal limits  ? ? ?EKG 02/10/22: Sinus rhythm with marked sinus arrhythmia ? ? ?CV: ?Echo 04/01/21:  ?1. Mild to moderate LVH, normal LV systolic function. LV compliance is reduced. EF 55% ?2. Mild MR ?3. Mild TR, mild elevation in R heart pressure ?  ?Cardiac cath 01/31/20:  ?Widely patent left main ?LAD is relatively small but does reach the left ventricular apex.  Moderate diffuse disease is noted proximally, there is eccentric 50% mid narrowing before a large septal perforator after an acute bend in the LAD before the origin of the last diagonal there is eccentric 75% stenosis in the LAD.  Distal LAD is less than 2 mm in diameter. ?The circumflex system is codominant with RCA.  The circumflex gives origin to have obtuse marginal branches.  The third obtuse marginal contains ostial 60% narrowing.  The proximal circumflex beyond a 90 degree bend contains a previously placed stent that has diffuse in-stent restenosis up to 50%.  No high-grade obstruction is noted. ?RCA is a codominant vessel is overlapping Cypher stent from  proximal to mid.  The entire stented segment contains diffuse in-stent restenosis up to 50%.  No high-grade focal stenosis is seen.  The nonstented RCA contains moderate diffuse disease. ?The EDP is 19 mmHg.  Left ventriculography was not performed because of kidney impairment. ? ? ?Past Medical History:  ?Diagnosis Date  ? Allergy   ? Arthritis   ? Calculus of gallbladder without mention of cholecystitis   ? Cataract   ? bilateral cateracts removed  ? Celiac disease   ? Chronic kidney disease   ? stage 3  ? Coronary atherosclerosis of unspecified type of vessel, native or graft   ? Dermatitis 06/08/2021  ? Erythema of lower extremity 09/08/2021  ? Esophageal reflux   ? Glaucoma   ? History of cellulitis 09/08/2021  ? History of kidney stones    ? Loss of weight   ? Lower extremity weakness 05/31/2021  ? Macular degeneration   ? Rt eye . injections  ? Old myocardial infarction   ? Osteoporosis, unspecified   ? Other malaise and fatigue   ? Other specified cardiac dysrhythmias(427.89)   ? Other testicular hypofunction   ? Pancreatic insufficiency 05/31/2021  ? Personal history of colonic polyps   ? Postural dizziness with presyncope 12/13/2021  ? Pure hypercholesterolemia   ? Thrombocytopenia ([place])   ? chronic, per PCP notes dating back to 2009  ? Tinea pedis 05/31/2021  ? Unspecified asthma(493.90)   ? Unspecified essential hypertension   ? Unspecified hypothyroidism   ? Venous stasis dermatitis 09/08/2021  ? ? ?Past Surgical History:  ?Procedure Laterality Date  ? cataract surgery Bilateral   ? COLONOSCOPY  06/26/2017  ? Fuller Plan  ? COLONOSCOPY WITH PROPOFOL N/A 11/27/2014  ? Procedure: COLONOSCOPY WITH PROPOFOL;  Surgeon: Milus Banister, MD;  Location: [facility];  Service: Endoscopy;  Laterality: N/A;  ? CORONARY ANGIOPLASTY    ? CORONARY STENT PLACEMENT  2011  ? Cypher; in distal circumflex artery  ? CYSTOSCOPY  1998  ? IR URETERAL STENT LEFT NEW ACCESS W/O SEP NEPHROSTOMY CATH  06/21/2021  ? KNEE SURGERY    ? right  ? LEFT HEART CATH AND CORONARY ANGIOGRAPHY N/A 01/31/2020  ? Procedure: LEFT HEART CATH AND CORONARY ANGIOGRAPHY;  Surgeon: Belva Crome, MD;  Location: [facility];  Service: Cardiovascular;  Laterality: N/A;  ? NEPHROLITHOTOMY Left 06/21/2021  ? Procedure: NEPHROLITHOTOMY PERCUTANEOUS;  Surgeon: Franchot Gallo, MD;  Location: [facility];  Service: Urology;  Laterality: Left;  90 MINS  ? SHOULDER SURGERY Left 07/2013  ? Dr Shara Blazing  ? UPPER GASTROINTESTINAL ENDOSCOPY    ? ? ?MEDICATIONS: ? calcitRIOL (ROCALTROL) 0.25 MCG capsule  ? carvedilol (COREG) 25 MG tablet  ? cetirizine (ZYRTEC) 10 MG tablet  ? Cholecalciferol 2000 UNITS TABS  ? clonazePAM (KLONOPIN) 0.25 MG disintegrating tablet  ? diclofenac sodium (VOLTAREN) 1 % GEL  ?  hydrALAZINE (APRESOLINE) 10 MG tablet  ? isosorbide mononitrate (IMDUR) 30 MG 24 hr tablet  ? levothyroxine (SYNTHROID) 175 MCG tablet  ? lipase/protease/amylase (CREON) 36000 UNITS CPEP capsule  ? Multiple Vitamins-Minerals (PRESERVISION AREDS 2) CAPS  ? mupirocin ointment (BACTROBAN) 2 %  ? nitroGLYCERIN (NITROSTAT) 0.4 MG SL tablet  ? olmesartan (BENICAR) 5 MG tablet  ? predniSONE (DELTASONE) 10 MG tablet  ? Propylene Glycol (SYSTANE BALANCE) 0.6 % SOLN  ? simvastatin (ZOCOR) 10 MG tablet  ? SYRINGE-NEEDLE, DISP, 3 ML (BD ECLIPSE SYRINGE) 23G X 1" 3 ML MISC  ? tamsulosin (FLOMAX) 0.4 MG CAPS  capsule  ? testosterone cypionate (DEPOTESTOSTERONE CYPIONATE) 200 MG/ML injection  ? UNABLE TO FIND  ? ?No current facility-administered medications for this encounter.  ? ? ?If no changes, from an anesthesia perspective, I anticipate pt can proceed with surgery as scheduled.  ? ?Willeen Cass, PhD, FNP-BC ?[facility]/Anesthesiology ?Phone: [phone] ?02/11/2022 1:35 PM  ? ? ? ? ? ? ?

## 2022-02-11 NOTE — Anesthesia Preprocedure Evaluation (Addendum)
Anesthesia Evaluation  ?Patient identified by MRN, date of birth, ID band ?Patient awake ? ? ? ?Reviewed: ?Allergy & Precautions, H&P , NPO status , Patient's Chart, lab work & pertinent test results ? ?Airway ?Mallampati: II ? ?TM Distance: >3 FB ?Neck ROM: Full ? ? ? Dental ?no notable dental hx. ?(+) Teeth Intact, Dental Advisory Given ?  ?Pulmonary ?asthma ,  ?  ?Pulmonary exam normal ?breath sounds clear to auscultation ? ? ? ? ? ? Cardiovascular ?Exercise Tolerance: Good ?hypertension, + CAD, + Past MI, + Cardiac Stents and +CHF  ? ?Rhythm:Regular Rate:Normal ? ? ?  ?Neuro/Psych ? Headaches, Anxiety   ? GI/Hepatic ?Neg liver ROS, GERD  ,  ?Endo/Other  ?Hypothyroidism  ? Renal/GU ?Renal InsufficiencyRenal disease  ?negative genitourinary ?  ?Musculoskeletal ? ?(+) Arthritis ,  ? Abdominal ?  ?Peds ? Hematology ?negative hematology ROS ?(+)   ?Anesthesia Other Findings ? ? Reproductive/Obstetrics ?negative OB ROS ? ?  ? ? ? ? ? ? ? ? ? ? ? ? ? ?  ?  ? ? ? ? ? ? ?Anesthesia Physical ?Anesthesia Plan ? ?ASA: 3 ? ?Anesthesia Plan: General  ? ?Post-op Pain Management: Tylenol PO (pre-op)* and Minimal or no pain anticipated  ? ?Induction: Intravenous ? ?PONV Risk Score and Plan: 2 and Ondansetron and Dexamethasone ? ?Airway Management Planned: LMA ? ?Additional Equipment:  ? ?Intra-op Plan:  ? ?Post-operative Plan: Extubation in OR ? ?Informed Consent: I have reviewed the patients History and Physical, chart, labs and discussed the procedure including the risks, benefits and alternatives for the proposed anesthesia with the patient or authorized representative who has indicated his/her understanding and acceptance.  ? ? ? ?Dental advisory given ? ?Plan Discussed with: CRNA ? ?Anesthesia Plan Comments: (See APP note by Durel Salts, FNP )  ? ? ? ? ?Anesthesia Quick Evaluation ? ?

## 2022-02-14 ENCOUNTER — Other Ambulatory Visit: Payer: Self-pay | Admitting: Internal Medicine

## 2022-02-14 DIAGNOSIS — K921 Melena: Secondary | ICD-10-CM

## 2022-02-14 MED ORDER — TESTOSTERONE CYPIONATE 200 MG/ML IM SOLN: INTRAMUSCULAR | 1 refills | Status: DC

## 2022-02-14 MED ORDER — PANTOPRAZOLE SODIUM 40 MG PO TBEC: 40.0000 mg | DELAYED_RELEASE_TABLET | Freq: Two times a day (BID) | ORAL | 5 refills | Status: DC

## 2022-02-14 NOTE — Progress Notes (Signed)
Black stools - new ?Start Protonix 40 bid ?GI ref  - EGD 6/22 Dr Fuller Plan ?Reduce predn to 30 mg/d ?

## 2022-02-18 ENCOUNTER — Other Ambulatory Visit: Payer: Self-pay

## 2022-02-18 ENCOUNTER — Ambulatory Visit (HOSPITAL_COMMUNITY)
Admission: RE | Admit: 2022-02-18 | Discharge: 2022-02-18 | Disposition: A | Discharge status: Home / Self Care | Payer: PPO | Attending: General Surgery | Admitting: General Surgery

## 2022-02-18 ENCOUNTER — Ambulatory Visit (HOSPITAL_BASED_OUTPATIENT_CLINIC_OR_DEPARTMENT_OTHER): Payer: PPO | Admitting: Anesthesiology

## 2022-02-18 ENCOUNTER — Encounter (HOSPITAL_COMMUNITY)
Admission: RE | Disposition: A | Discharge status: Home / Self Care | Payer: Self-pay | Source: Home / Self Care | Attending: General Surgery

## 2022-02-18 ENCOUNTER — Encounter (HOSPITAL_COMMUNITY): Payer: Self-pay | Admitting: General Surgery

## 2022-02-18 ENCOUNTER — Ambulatory Visit (HOSPITAL_COMMUNITY): Payer: PPO | Admitting: Emergency Medicine

## 2022-02-18 DIAGNOSIS — I130 Hypertensive heart and chronic kidney disease with heart failure and stage 1 through stage 4 chronic kidney disease, or unspecified chronic kidney disease: Secondary | ICD-10-CM | POA: Insufficient documentation

## 2022-02-18 DIAGNOSIS — I2510 Atherosclerotic heart disease of native coronary artery without angina pectoris: Secondary | ICD-10-CM

## 2022-02-18 DIAGNOSIS — Z955 Presence of coronary angioplasty implant and graft: Secondary | ICD-10-CM | POA: Diagnosis not present

## 2022-02-18 DIAGNOSIS — Z79899 Other long term (current) drug therapy: Secondary | ICD-10-CM | POA: Insufficient documentation

## 2022-02-18 DIAGNOSIS — I110 Hypertensive heart disease with heart failure: Secondary | ICD-10-CM

## 2022-02-18 DIAGNOSIS — H3530 Unspecified macular degeneration: Secondary | ICD-10-CM | POA: Insufficient documentation

## 2022-02-18 DIAGNOSIS — Z7952 Long term (current) use of systemic steroids: Secondary | ICD-10-CM | POA: Diagnosis not present

## 2022-02-18 DIAGNOSIS — I252 Old myocardial infarction: Secondary | ICD-10-CM | POA: Diagnosis not present

## 2022-02-18 DIAGNOSIS — Z01818 Encounter for other preprocedural examination: Secondary | ICD-10-CM

## 2022-02-18 DIAGNOSIS — J45909 Unspecified asthma, uncomplicated: Secondary | ICD-10-CM | POA: Insufficient documentation

## 2022-02-18 DIAGNOSIS — R519 Headache, unspecified: Secondary | ICD-10-CM

## 2022-02-18 DIAGNOSIS — M1990 Unspecified osteoarthritis, unspecified site: Secondary | ICD-10-CM | POA: Diagnosis not present

## 2022-02-18 DIAGNOSIS — K219 Gastro-esophageal reflux disease without esophagitis: Secondary | ICD-10-CM | POA: Diagnosis not present

## 2022-02-18 DIAGNOSIS — M316 Other giant cell arteritis: Secondary | ICD-10-CM | POA: Diagnosis not present

## 2022-02-18 DIAGNOSIS — N189 Chronic kidney disease, unspecified: Secondary | ICD-10-CM | POA: Diagnosis not present

## 2022-02-18 DIAGNOSIS — I509 Heart failure, unspecified: Secondary | ICD-10-CM

## 2022-02-18 DIAGNOSIS — F419 Anxiety disorder, unspecified: Secondary | ICD-10-CM | POA: Insufficient documentation

## 2022-02-18 DIAGNOSIS — E039 Hypothyroidism, unspecified: Secondary | ICD-10-CM | POA: Diagnosis not present

## 2022-02-18 DIAGNOSIS — I672 Cerebral atherosclerosis: Secondary | ICD-10-CM | POA: Diagnosis not present

## 2022-02-18 HISTORY — PX: ARTERY BIOPSY: SHX891

## 2022-02-18 SURGERY — BIOPSY TEMPORAL ARTERY
Anesthesia: General | Laterality: Left

## 2022-02-18 MED ORDER — BUPIVACAINE-EPINEPHRINE (PF) 0.25% -1:200000 IJ SOLN
INTRAMUSCULAR | Status: AC
  Filled 2022-02-18: qty 30

## 2022-02-18 MED ORDER — 0.9 % SODIUM CHLORIDE (POUR BTL) OPTIME
TOPICAL | Status: DC | PRN
  Administered 2022-02-18: 1000 mL

## 2022-02-18 MED ORDER — ONDANSETRON HCL 4 MG/2ML IJ SOLN
INTRAMUSCULAR | Status: DC | PRN
  Administered 2022-02-18: 4 mg via INTRAVENOUS

## 2022-02-18 MED ORDER — CHLORHEXIDINE GLUCONATE CLOTH 2 % EX PADS: 6.0000 | MEDICATED_PAD | Freq: Once | CUTANEOUS | Status: DC

## 2022-02-18 MED ORDER — FENTANYL CITRATE (PF) 100 MCG/2ML IJ SOLN
INTRAMUSCULAR | Status: DC | PRN
  Administered 2022-02-18: 25 ug via INTRAVENOUS

## 2022-02-18 MED ORDER — PROPOFOL 10 MG/ML IV BOLUS
INTRAVENOUS | Status: DC | PRN
  Administered 2022-02-18: 120 mg via INTRAVENOUS

## 2022-02-18 MED ORDER — SODIUM CHLORIDE (PF) 0.9 % IJ SOLN
INTRAMUSCULAR | Status: AC
  Filled 2022-02-18: qty 20

## 2022-02-18 MED ORDER — CEFAZOLIN SODIUM-DEXTROSE 2-4 GM/100ML-% IV SOLN
2.0000 g | INTRAVENOUS | Status: AC
  Administered 2022-02-18: 2 g via INTRAVENOUS
  Filled 2022-02-18: qty 100

## 2022-02-18 MED ORDER — LIDOCAINE 2% (20 MG/ML) 5 ML SYRINGE
INTRAMUSCULAR | Status: DC | PRN
  Administered 2022-02-18: 60 mg via INTRAVENOUS

## 2022-02-18 MED ORDER — LACTATED RINGERS IV SOLN: INTRAVENOUS | Status: DC

## 2022-02-18 MED ORDER — PROPOFOL 10 MG/ML IV BOLUS
INTRAVENOUS | Status: AC
  Filled 2022-02-18: qty 20

## 2022-02-18 MED ORDER — ENSURE PRE-SURGERY PO LIQD
296.0000 mL | Freq: Once | ORAL | Status: DC
  Filled 2022-02-18: qty 296

## 2022-02-18 MED ORDER — FENTANYL CITRATE (PF) 100 MCG/2ML IJ SOLN
INTRAMUSCULAR | Status: AC
  Filled 2022-02-18: qty 2

## 2022-02-18 MED ORDER — ACETAMINOPHEN 500 MG PO TABS
1000.0000 mg | ORAL_TABLET | ORAL | Status: AC
  Administered 2022-02-18: 1000 mg via ORAL

## 2022-02-18 MED ORDER — ORAL CARE MOUTH RINSE: 15.0000 mL | Freq: Once | OROMUCOSAL | Status: AC

## 2022-02-18 MED ORDER — VASOPRESSIN 20 UNIT/ML IV SOLN
INTRAVENOUS | Status: DC | PRN
  Administered 2022-02-18 (×3): 1 [IU] via INTRAVENOUS

## 2022-02-18 MED ORDER — ACETAMINOPHEN 500 MG PO TABS
1000.0000 mg | ORAL_TABLET | Freq: Once | ORAL | Status: DC
  Filled 2022-02-18: qty 2

## 2022-02-18 MED ORDER — PHENYLEPHRINE 80 MCG/ML (10ML) SYRINGE FOR IV PUSH (FOR BLOOD PRESSURE SUPPORT)
PREFILLED_SYRINGE | INTRAVENOUS | Status: DC | PRN
  Administered 2022-02-18 (×2): 160 ug via INTRAVENOUS

## 2022-02-18 MED ORDER — FENTANYL CITRATE PF 50 MCG/ML IJ SOSY: 25.0000 ug | PREFILLED_SYRINGE | INTRAMUSCULAR | Status: DC | PRN

## 2022-02-18 MED ORDER — LIDOCAINE HCL (PF) 2 % IJ SOLN
INTRAMUSCULAR | Status: AC
  Filled 2022-02-18: qty 5

## 2022-02-18 MED ORDER — BUPIVACAINE-EPINEPHRINE 0.25% -1:200000 IJ SOLN
INTRAMUSCULAR | Status: DC | PRN
  Administered 2022-02-18: 13 mL

## 2022-02-18 MED ORDER — DEXAMETHASONE SODIUM PHOSPHATE 10 MG/ML IJ SOLN
INTRAMUSCULAR | Status: DC | PRN
  Administered 2022-02-18: 10 mg via INTRAVENOUS

## 2022-02-18 MED ORDER — CHLORHEXIDINE GLUCONATE 0.12 % MT SOLN
15.0000 mL | Freq: Once | OROMUCOSAL | Status: AC
  Administered 2022-02-18: 15 mL via OROMUCOSAL

## 2022-02-18 MED ORDER — VASOPRESSIN 20 UNIT/ML IV SOLN
INTRAVENOUS | Status: AC
  Filled 2022-02-18: qty 1

## 2022-02-18 MED ORDER — PHENYLEPHRINE 80 MCG/ML (10ML) SYRINGE FOR IV PUSH (FOR BLOOD PRESSURE SUPPORT)
PREFILLED_SYRINGE | INTRAVENOUS | Status: AC
  Filled 2022-02-18: qty 10

## 2022-02-18 MED ORDER — ONDANSETRON HCL 4 MG/2ML IJ SOLN
INTRAMUSCULAR | Status: AC
  Filled 2022-02-18: qty 2

## 2022-02-18 MED ORDER — ACETAMINOPHEN 500 MG PO TABS: 500.0000 mg | ORAL_TABLET | Freq: Four times a day (QID) | ORAL | 0 refills | Status: DC | PRN

## 2022-02-18 SURGICAL SUPPLY — 47 items
ADH SKN CLS APL DERMABOND .7 (GAUZE/BANDAGES/DRESSINGS) ×1
APL PRP STRL LF DISP 70% ISPRP (MISCELLANEOUS)
BAG COUNTER SPONGE SURGICOUNT (BAG) IMPLANT
BAG SPNG CNTER NS LX DISP (BAG)
BLADE SURG 15 STRL LF DISP TIS (BLADE) ×1 IMPLANT
BLADE SURG 15 STRL SS (BLADE) ×2
CHLORAPREP W/TINT 26 (MISCELLANEOUS) ×1 IMPLANT
COVER SURGICAL LIGHT HANDLE (MISCELLANEOUS) ×2 IMPLANT
DERMABOND ADVANCED (GAUZE/BANDAGES/DRESSINGS) ×1
DERMABOND ADVANCED .7 DNX12 (GAUZE/BANDAGES/DRESSINGS) ×1 IMPLANT
DISSECTOR ROUND CHERRY 3/8 STR (MISCELLANEOUS) IMPLANT
DRAPE LAPAROTOMY T 98X78 PEDS (DRAPES) ×2 IMPLANT
DRSG TEGADERM 2-3/8X2-3/4 SM (GAUZE/BANDAGES/DRESSINGS) IMPLANT
ELECT NDL TIP 2.8 STRL (NEEDLE) IMPLANT
ELECT NEEDLE TIP 2.8 STRL (NEEDLE) IMPLANT
ELECT REM PT RETURN 15FT ADLT (MISCELLANEOUS) ×2 IMPLANT
GAUZE 4X4 16PLY ~~LOC~~+RFID DBL (SPONGE) ×1 IMPLANT
GAUZE SPONGE 2X2 8PLY STRL LF (GAUZE/BANDAGES/DRESSINGS) IMPLANT
GAUZE SPONGE 4X4 12PLY STRL (GAUZE/BANDAGES/DRESSINGS) IMPLANT
GLOVE BIOGEL PI IND STRL 7.0 (GLOVE) ×1 IMPLANT
GLOVE BIOGEL PI INDICATOR 7.0 (GLOVE) ×1
GLOVE SURG SS PI 7.0 STRL IVOR (GLOVE) ×2 IMPLANT
GOWN STRL REUS W/ TWL LRG LVL3 (GOWN DISPOSABLE) ×1 IMPLANT
GOWN STRL REUS W/ TWL XL LVL3 (GOWN DISPOSABLE) IMPLANT
GOWN STRL REUS W/TWL LRG LVL3 (GOWN DISPOSABLE) ×2
GOWN STRL REUS W/TWL XL LVL3 (GOWN DISPOSABLE)
KIT BASIN OR (CUSTOM PROCEDURE TRAY) ×2 IMPLANT
KIT TURNOVER KIT A (KITS) ×1 IMPLANT
NEEDLE HYPO 22GX1.5 SAFETY (NEEDLE) ×1 IMPLANT
PACK BASIC VI WITH GOWN DISP (CUSTOM PROCEDURE TRAY) ×2 IMPLANT
PENCIL SMOKE EVACUATOR (MISCELLANEOUS) IMPLANT
SPONGE GAUZE 2X2 STER 10/PKG (GAUZE/BANDAGES/DRESSINGS)
SUCTION FRAZIER HANDLE 12FR (TUBING)
SUCTION TUBE FRAZIER 12FR DISP (TUBING) IMPLANT
SUT MNCRL AB 4-0 PS2 18 (SUTURE) ×2 IMPLANT
SUT SILK 2 0 (SUTURE)
SUT SILK 2-0 18XBRD TIE 12 (SUTURE) IMPLANT
SUT SILK 3 0 (SUTURE)
SUT SILK 3-0 18XBRD TIE 12 (SUTURE) IMPLANT
SUT SILK 4-0 12X30IN (SUTURE) ×2 IMPLANT
SUT VIC AB 3-0 SH 27 (SUTURE) ×2
SUT VIC AB 3-0 SH 27X BRD (SUTURE) ×1 IMPLANT
SUT VIC AB 3-0 SH 27XBRD (SUTURE) IMPLANT
SUT VICRYL AB 3 0 TIES (SUTURE) ×1 IMPLANT
SYR CONTROL 10ML LL (SYRINGE) IMPLANT
TOWEL OR 17X26 10 PK STRL BLUE (TOWEL DISPOSABLE) ×2 IMPLANT
TOWEL OR NON WOVEN STRL DISP B (DISPOSABLE) ×2 IMPLANT

## 2022-02-18 NOTE — Anesthesia Procedure Notes (Signed)
Procedure Name: LMA Insertion ?Date/Time: 02/18/2022 7:35 AM ?Performed by: Sharlette Dense, CRNA ?Patient Re-evaluated:Patient Re-evaluated prior to induction ?Oxygen Delivery Method: Circle system utilized ?Preoxygenation: Pre-oxygenation with 100% oxygen ?Induction Type: IV induction ?LMA: LMA inserted ?LMA Size: 4.0 ?Number of attempts: 1 ?Placement Confirmation: positive ETCO2 and breath sounds checked- equal and bilateral ?Tube secured with: Tape ?Dental Injury: Teeth and Oropharynx as per pre-operative assessment  ? ? ? ? ?

## 2022-02-18 NOTE — H&P (Signed)
?Chief Complaint: temporal artery ? ? ?History of Present Illness: ?Scott Oneal is a 81 y.o. male who is seen today as an office consultation for evaluation of temporal artery ?Marland Kitchen  ?He has had blurry vision for the last 2 months. He also noted pain and firm tube over his left temple. He talked with a neurologist who recommended he talk with his PCP. He was put on steroids 6 weeks ago and referred here. His vision changes are in both eyes. He has macular degeneration as well. He is concerned the usefulness of the test may be affective due to the steroid treatment. ? ?Review of Systems: ?A complete review of systems was obtained from the patient. I have reviewed this information and discussed as appropriate with the patient. See HPI as well for other ROS. ? ?Review of Systems  ?Constitutional: Negative.  ?HENT: Negative.  ?Eyes: Negative.  ?Respiratory: Negative.  ?Cardiovascular: Negative.  ?Gastrointestinal: Negative.  ?Genitourinary: Negative.  ?Musculoskeletal: Negative.  ?Skin: Negative.  ?Neurological: Negative.  ?Endo/Heme/Allergies: Negative.  ?Psychiatric/Behavioral: Negative.  ? ? ?Medical History: ?Past Medical History:  ?Diagnosis Date  ? Arthritis  ? CHF (congestive heart failure) (CMS-HCC)  ? Chronic kidney disease  ? ?There is no problem list on file for this patient. ? ?History reviewed. No pertinent surgical history.  ? ?Allergies  ?Allergen Reactions  ? Clarithromycin Other (See Comments)  ?Dizziness ? ? Tizanidine Other (See Comments)  ?Felt funny ? ? Amlodipine Other (See Comments)  ?Edema  ? ? Amoxicillin Rash  ?Rash on arms that developed towards end of 7 day treatment  ? Benzalkonium Chloride Rash  ?Other reaction(s): mild rash/itching ? ? Doxycycline Photosensitivity  ?rash  ? Metoprolol Other (See Comments)  ?REACTION: fatigue ? ? Pramoxine-Allantoin Other (See Comments)  ?Pt does't remember reaction  ? ?Current Outpatient Medications on File Prior to Visit  ?Medication Sig Dispense Refill   ? calcitRIOL (ROCALTROL) 0.25 MCG capsule calcitriol 0.25 mcg capsule ?TAKE 1 CAPSULE BY MOUTH EVERY DAY  ? clonazePAM (KLONOPIN) 0.25 MG disintegrating tablet clonazepam 0.25 mg disintegrating tablet ?TAKE 1 TABLET (0.25 MG TOTAL) BY MOUTH 2 (TWO) TIMES DAILY AS NEEDED (ANXIETY).  ? hydrALAZINE (APRESOLINE) 10 MG tablet Take 1-2 tablet 3 times a day as needed if blood pressure is >170  ? isosorbide mononitrate (IMDUR) 30 MG ER tablet isosorbide mononitrate ER 30 mg tablet,extended release 24 hr ?TAKE 1 TABLET BY MOUTH EVERY DAY  ? levothyroxine (SYNTHROID) 175 MCG tablet Take by mouth  ? mupirocin (BACTROBAN) 2 % ointment Apply topically  ? predniSONE (DELTASONE) 10 MG tablet Take by mouth  ? syringe with needle (BD ECLIPSE LUER-LOK) 3 mL 23 x 1" Syrg As directed IM  ? testosterone cypionate (DEPO-TESTOSTERONE) 200 mg/mL injection INJECT 2ML INTO MUSCLE EVERY 2 WEEKS  ? carvediloL (COREG) 12.5 MG tablet Take by mouth  ? cetirizine (ZYRTEC) 10 MG tablet Take by mouth  ? cholecalciferol (VITAMIN D3) 2,000 unit capsule  ? CREON 36,000-114,000- 180,000 unit DR capsule TAKE 2 CAPSULES BY MOUTH 3 TIMES DAILY WITH MEALS. MAY ALSO TAKE 1 CAPSULE AS NEEDED (WITH SNACKS).  ? diclofenac (VOLTAREN) 1 % topical gel Apply topically every 6 (six) hours  ? multivitamin with minerals, EYE, (PRESERVISION AREDS-2) soft gel capsule  ? nitroGLYcerin (NITROSTAT) 0.4 MG SL tablet TAKE 1 TAB UNDER TONGUE FOR CHEST PAIN MAY REPEAT EVERY 5 MIN. MAX FOR 3 TAB IN 15 MIN  ? olmesartan (BENICAR) 5 MG tablet Take 5 mg by mouth once daily  ?  propylene glycoL (SYSTANE BALANCE) 0.6 % ophthalmic drops Apply to eye  ? simvastatin (ZOCOR) 10 MG tablet Take 10 mg by mouth at bedtime  ? tamsulosin (FLOMAX) 0.4 mg capsule Take by mouth  ? ?No current facility-administered medications on file prior to visit.  ? ?History reviewed. No pertinent family history.  ? ?Social History  ? ?Tobacco Use  ?Smoking Status Never  ?Smokeless Tobacco Never  ? ? ?Social  History  ? ?Socioeconomic History  ? Marital status: Married  ?Tobacco Use  ? Smoking status: Never  ? Smokeless tobacco: Never  ?Substance and Sexual Activity  ? Alcohol use: Never  ? Drug use: Never  ? ?Objective:  ? ?Vitals:  ?02/03/22 1508  ?BP: (!) 150/80  ?Pulse: 84  ?Temp: 36.8 ?C (98.2 ?F)  ?SpO2: 99%  ?Weight: 86.3 kg (190 lb 3.2 oz)  ?Height: 182.9 cm (6')  ? ?Body mass index is 25.8 kg/m?. ? ?Physical Exam ?Constitutional:  ?Appearance: Normal appearance.  ?HENT:  ?Head: Normocephalic and atraumatic.  ?Comments: Bilateral palpable temporal arteries, left side more pronounced ?Pulmonary:  ?Effort: Pulmonary effort is normal.  ?Musculoskeletal:  ?General: Normal range of motion.  ?Cervical back: Normal range of motion.  ?Neurological:  ?General: No focal deficit present.  ?Mental Status: He is alert and oriented to person, place, and time. Mental status is at baseline.  ?Psychiatric:  ?Mood and Affect: Mood normal.  ?Behavior: Behavior normal.  ?Thought Content: Thought content normal.  ? ?Labs, Imaging and Diagnostic Testing: ?I reviewed Dr. Judeen Hammans notes ? ?Assessment and Plan:  ? ?Diagnoses and all orders for this visit: ? ?Giant cell arteritis (CMS-HCC) ? ? ? ?We discussed his symptoms and treatment so far. I agree that steroids can negatively impact the biopsy outcome, but think it is still worthwhile to proceed with biopsy and do not think his providers should have delayed treatment. ? ?The pathophysiology of temporal arteritis was discussed. Differential diagnosis is discussed. Recommendation has been made for an excision of a segment of the temporal artery near the front of the ear as an excisional biopsy for diagnostic purposes. We plan to do it on the same side where the patient is having the symptoms. Risks of bleeding, infection, nerve injury and other risks were discussed in detail. Questions were answered. The patient agrees to proceed  ?

## 2022-02-18 NOTE — Anesthesia Postprocedure Evaluation (Signed)
Anesthesia Post Note ? ?Patient: Scott Oneal ? ?Procedure(s) Performed: LEFT TEMPORAL ARTERY BIOPSY (Left) ? ?  ? ?Patient location during evaluation: PACU ?Anesthesia Type: General ?Level of consciousness: awake and alert ?Pain management: pain level controlled ?Vital Signs Assessment: post-procedure vital signs reviewed and stable ?Respiratory status: spontaneous breathing, nonlabored ventilation and respiratory function stable ?Cardiovascular status: blood pressure returned to baseline and stable ?Postop Assessment: no apparent nausea or vomiting ?Anesthetic complications: no ? ? ?No notable events documented. ? ?Last Vitals:  ?Vitals:  ? 02/18/22 0845 02/18/22 0915  ?BP: (!) 155/88 139/74  ?Pulse: (!) 46 (!) 48  ?Resp: 17 14  ?Temp:    ?SpO2: 96% 97%  ?  ?Last Pain:  ?Vitals:  ? 02/18/22 0915  ?TempSrc:   ?PainSc: 0-No pain  ? ? ?  ?  ?  ?  ?  ?  ? ?Scott Oneal,Scott Oneal ? ? ? ? ?

## 2022-02-18 NOTE — Op Note (Signed)
Preoperative diagnosis: headaches ? ?Postoperative diagnosis: same  ? ?Procedure: excisional left temporal artery biopsy ? ?Surgeon: Gurney Maxin, M.D. ? ?Asst: none ? ?Anesthesia: General endotracheal anesthesia ? ?Indications for procedure: Scott Oneal is a 81 y.o. year old male with symptoms of recurring headaches not improving with medications. Due to concern for temporal arteritis decision was made to proceed with excisional temporal artery biopsy. ? ?Description of procedure: The patient was brought into the operative suite. Anesthesia was administered with General endotracheal anesthesia. WHO checklist was applied. The patient was then placed in supine position. The hair above the ear was clipped. The area was prepped and draped in the usual sterile fashion. ? ?Next, doppler was used to identify the temporal artery. The area was infused with marcaine for anesthetic. A vertical incision was over the area. Once through the skin, blunt dissection was used to identify the artery. doppler was used to confirm the location. The artery was dissected proximally and distally for length. Next, 3-0 vicryl ties were used to ligate the artery and the artery was divided at each end and sent to pathology. The area was irrigated and hemostasis was intact. doppler was then used to confirm removal of the artery and no other arterial structures in the area. The skin was then closed with 3-0 vicryl in the deep dermal level and 4-0 monocryl subcuticular running suture for the superficial layer. Dermabond was put in place for dressing. The patient was brought to pacu in stable condition. ? ?Findings: temporal artery ? ?Specimen: left temporal artery ? ?Implant: none  ? ?Blood loss: 5 ml ? ?Local anesthesia:  13 ml marcaine  ? ?Complications: none ? ?Gurney Maxin, M.D. ?General, Bariatric, & Minimally Invasive Surgery ?[facility], [place] ? ?

## 2022-02-18 NOTE — Transfer of Care (Signed)
Immediate Anesthesia Transfer of Care Note ? ?Patient: Scott Oneal ? ?Procedure(s) Performed: LEFT TEMPORAL ARTERY BIOPSY (Left) ? ?Patient Location: PACU ? ?Anesthesia Type:General ? ?Level of Consciousness: awake, alert  and oriented ? ?Airway & Oxygen Therapy: Patient Spontanous Breathing and Patient connected to face mask oxygen ? ?Post-op Assessment: Report given to RN and Post -op Vital signs reviewed and stable ? ?Post vital signs: Reviewed and stable ? ?Last Vitals:  ?Vitals Value Taken Time  ?BP 143/90 02/18/22 0825  ?Temp    ?Pulse 53 02/18/22 0827  ?Resp 18 02/18/22 0827  ?SpO2 100 % 02/18/22 0827  ?Vitals shown include unvalidated device data. ? ?Last Pain:  ?Vitals:  ? 02/18/22 0602  ?TempSrc:   ?PainSc: 0-No pain  ?   ? ?Patients Stated Pain Goal: 3 (02/18/22 0602) ? ?Complications: No notable events documented. ?

## 2022-02-19 ENCOUNTER — Encounter (HOSPITAL_COMMUNITY): Payer: Self-pay | Admitting: General Surgery

## 2022-02-21 LAB — SURGICAL PATHOLOGY

## 2022-02-23 ENCOUNTER — Ambulatory Visit: Payer: PPO | Admitting: Dermatology

## 2022-03-01 ENCOUNTER — Other Ambulatory Visit: Payer: Self-pay | Admitting: Internal Medicine

## 2022-03-01 ENCOUNTER — Other Ambulatory Visit: Payer: Self-pay | Admitting: Cardiovascular Disease

## 2022-03-08 ENCOUNTER — Ambulatory Visit (INDEPENDENT_AMBULATORY_CARE_PROVIDER_SITE_OTHER): Payer: PPO | Admitting: Internal Medicine

## 2022-03-08 ENCOUNTER — Encounter: Payer: Self-pay | Admitting: Internal Medicine

## 2022-03-08 DIAGNOSIS — N1832 Chronic kidney disease, stage 3b: Secondary | ICD-10-CM

## 2022-03-08 DIAGNOSIS — G43909 Migraine, unspecified, not intractable, without status migrainosus: Secondary | ICD-10-CM

## 2022-03-08 DIAGNOSIS — R609 Edema, unspecified: Secondary | ICD-10-CM | POA: Diagnosis not present

## 2022-03-08 MED ORDER — FUROSEMIDE 40 MG PO TABS: 40.0000 mg | ORAL_TABLET | Freq: Every day | ORAL | 3 refills | Status: DC | PRN

## 2022-03-08 NOTE — Patient Instructions (Addendum)
Reduce your prednisone to 10 mg a day for 1 week, then go to 5 mg a day x 1 week, then stop ?Start furosemide as needed ?Use compression socks ?Elevate legs ?

## 2022-03-08 NOTE — Assessment & Plan Note (Signed)
Hydralazine 10-20 mg 3 times a day for BP>170 as needed ?GFR-31.  No NSAIDs.  Good hydration.  Regular nephrology follow-up visits with labs ?

## 2022-03-08 NOTE — Assessment & Plan Note (Signed)
Worse ?Start furosemide prn ?Use compression socks ?Elevate legs ?

## 2022-03-08 NOTE — Progress Notes (Signed)
? ?Subjective:  ?Patient ID: Scott Oneal, male    DOB: 06-Apr-1941  Age: 81 y.o. MRN: [id number] ? ?CC: No chief complaint on file. ? ? ?HPI ?[name] presents for HAs/?PMR - bx was (-) ?F/u HTN, edema L>R, leaking fluid x 2 weeks ? ?Outpatient Medications Prior to Visit  ?Medication Sig Dispense Refill  ? acetaminophen (TYLENOL) 500 MG tablet Take 1 tablet (500 mg total) by mouth every 6 (six) hours as needed for up to 30 doses. 30 tablet 0  ? calcitRIOL (ROCALTROL) 0.25 MCG capsule TAKE 1 CAPSULE BY MOUTH EVERY DAY 90 capsule 1  ? carvedilol (COREG) 25 MG tablet TAKE 1 TABLET BY MOUTH TWICE A DAY WITH MEALS (Patient taking differently: Take 25 mg by mouth 2 (two) times daily with a meal.) 180 tablet 1  ? cetirizine (ZYRTEC) 10 MG tablet Take 10 mg by mouth daily as needed for allergies.    ? chlorhexidine (PERIDEX) 0.12 % solution Use as directed 15 mLs in the mouth or throat 2 (two) times daily.    ? Cholecalciferol 2000 UNITS TABS Take 2,000 Units by mouth daily.    ? clonazePAM (KLONOPIN) 0.25 MG disintegrating tablet TAKE 1 TABLET (0.25 MG TOTAL) BY MOUTH 2 (TWO) TIMES DAILY AS NEEDED (ANXIETY). 60 tablet 3  ? diclofenac sodium (VOLTAREN) 1 % GEL APPLY 4 G TOPICALLY 4 TIMES DAILY. (Patient taking differently: Apply 4 g topically 4 (four) times daily as needed (pain).) 200 g 5  ? doxycycline (VIBRAMYCIN) 100 MG capsule Take 100 mg by mouth 2 (two) times daily.    ? hydrALAZINE (APRESOLINE) 10 MG tablet Take 1-2 tablet 3 times a day as needed if blood pressure is >170 (Patient taking differently: Take 10-20 mg by mouth 3 (three) times daily as needed (blood pressure). Take 1-2 tablet 3 times a day as needed if blood pressure is >170) 60 tablet 3  ? isosorbide mononitrate (IMDUR) 30 MG 24 hr tablet Take 1 tablet (30 mg total) by mouth daily. 90 tablet 2  ? levothyroxine (SYNTHROID) 175 MCG tablet TAKE 1 TABLET BY MOUTH EVERY DAY 90 tablet 3  ? lipase/protease/amylase (CREON) 36000 UNITS CPEP capsule Take 2  capsules (72,000 Units total) by mouth 3 (three) times daily with meals. May also take 1 capsule (36,000 Units total) as needed (with snacks). 240 capsule 11  ? Multiple Vitamins-Minerals (PRESERVISION AREDS 2) CAPS Take 1 capsule by mouth in the morning and at bedtime.    ? mupirocin ointment (BACTROBAN) 2 % Apply 1 application topically 2 (two) times daily. Apply to wounds, cuts to prevent infection (Patient taking differently: Apply 1 application. topically daily as needed (cellulitis flare).) 22 g 0  ? nitroGLYCERIN (NITROSTAT) 0.4 MG SL tablet Place 0.4 mg under the tongue every 5 (five) minutes as needed for chest pain.    ? olmesartan (BENICAR) 5 MG tablet TAKE 1 TABLET BY MOUTH EVERY DAY 90 tablet 0  ? pantoprazole (PROTONIX) 40 MG tablet Take 1 tablet (40 mg total) by mouth 2 (two) times daily. 60 tablet 5  ? predniSONE (DELTASONE) 10 MG tablet Take 4 tablets (40 mg total) by mouth daily with breakfast. 120 tablet 3  ? Propylene Glycol (SYSTANE BALANCE) 0.6 % SOLN Place 1 drop into both eyes daily as needed (Dry eye).    ? simvastatin (ZOCOR) 10 MG tablet TAKE 1 TABLET BY MOUTH EVERYDAY AT BEDTIME (Patient taking differently: Take 10 mg by mouth at bedtime.) 90 tablet 3  ? SYRINGE-NEEDLE,  DISP, 3 ML (BD ECLIPSE SYRINGE) 23G X 1" 3 ML MISC As directed IM 50 each 2  ? tamsulosin (FLOMAX) 0.4 MG CAPS capsule Take 0.4 mg by mouth daily.    ? testosterone cypionate (DEPOTESTOSTERONE CYPIONATE) 200 MG/ML injection Inject 60m into muscle every two weeks 10 mL 1  ? UNABLE TO FIND Take 2 tablets by mouth daily. Med Name: [name]   ? BOOSTRIX 5-2.5-18.5 LF-MCG/0.5 injection Inject 0.5 mLs into the muscle once.    ? ?No facility-administered medications prior to visit.  ? ? ?ROS: ?Review of Systems  ?Constitutional:  Positive for fatigue. Negative for appetite change and unexpected weight change.  ?HENT:  Negative for congestion, nosebleeds, sneezing, sore throat and trouble swallowing.   ?Eyes:  Negative for  itching and visual disturbance.  ?Respiratory:  Negative for cough.   ?Cardiovascular:  Positive for leg swelling. Negative for chest pain and palpitations.  ?Gastrointestinal:  Negative for abdominal distention, blood in stool, diarrhea and nausea.  ?Genitourinary:  Negative for frequency and hematuria.  ?Musculoskeletal:  Positive for arthralgias and gait problem. Negative for back pain, joint swelling and neck pain.  ?Skin:  Negative for rash.  ?Neurological:  Negative for dizziness, tremors, speech difficulty and weakness.  ?Psychiatric/Behavioral:  Negative for agitation, dysphoric mood, sleep disturbance and suicidal ideas. The patient is not nervous/anxious.   ? ?Objective:  ?BP 118/80 (BP Location: Left Arm, Patient Position: Sitting, Cuff Size: Normal)   Pulse 80   Temp 98.7 ?F (37.1 ?C) (Oral)   Ht 6' (1.829 m)   Wt 186 lb (84.4 kg)   SpO2 94%   BMI 25.23 kg/m?  ? ?BP Readings from Last 3 Encounters:  ?03/08/22 118/80  ?02/18/22 139/74  ?02/10/22 134/74  ? ? ?Wt Readings from Last 3 Encounters:  ?03/08/22 186 lb (84.4 kg)  ?02/18/22 190 lb 2 oz (86.2 kg)  ?02/10/22 190 lb 2 oz (86.2 kg)  ? ? ?Physical Exam ?Constitutional:   ?   General: He is not in acute distress. ?   Appearance: Normal appearance. He is well-developed.  ?   Comments: NAD  ?Eyes:  ?   Conjunctiva/sclera: Conjunctivae normal.  ?   Pupils: Pupils are equal, round, and reactive to light.  ?Neck:  ?   Thyroid: No thyromegaly.  ?   Vascular: No JVD.  ?Cardiovascular:  ?   Rate and Rhythm: Normal rate and regular rhythm.  ?   Heart sounds: Normal heart sounds. No murmur heard. ?  No friction rub. No gallop.  ?Pulmonary:  ?   Effort: Pulmonary effort is normal. No respiratory distress.  ?   Breath sounds: Normal breath sounds. No wheezing or rales.  ?Chest:  ?   Chest wall: No tenderness.  ?Abdominal:  ?   General: Bowel sounds are normal. There is no distension.  ?   Palpations: Abdomen is soft. There is no mass.  ?   Tenderness: There  is no abdominal tenderness. There is no guarding or rebound.  ?Musculoskeletal:     ?   General: Tenderness present. Normal range of motion.  ?   Cervical back: Normal range of motion.  ?   Right lower leg: Edema present.  ?   Left lower leg: Edema present.  ?Lymphadenopathy:  ?   Cervical: No cervical adenopathy.  ?Skin: ?   General: Skin is warm and dry.  ?   Findings: No rash.  ?Neurological:  ?   Mental Status: He is alert and  oriented to person, place, and time.  ?   Cranial Nerves: No cranial nerve deficit.  ?   Motor: Weakness present. No abnormal muscle tone.  ?   Coordination: Coordination abnormal.  ?   Gait: Gait abnormal.  ?   Deep Tendon Reflexes: Reflexes are normal and symmetric.  ?Psychiatric:     ?   Behavior: Behavior normal.     ?   Thought Content: Thought content normal.     ?   Judgment: Judgment normal.  ?L>R edema 1+ ?L LE w/hyperpigmentation ?2 erosions on the L shin ? ?Lab Results  ?Component Value Date  ? WBC 6.6 02/10/2022  ? HGB 15.3 02/10/2022  ? HCT 47.7 02/10/2022  ? PLT 57 (L) 02/10/2022  ? GLUCOSE 111 (H) 02/10/2022  ? CHOL 115 02/18/2020  ? TRIG 115 02/18/2020  ? HDL 34 (L) 02/18/2020  ? [place] 60 02/18/2020  ? ALT 17 12/28/2021  ? AST 15 12/28/2021  ? NA 141 02/10/2022  ? K 4.3 02/10/2022  ? CL 106 02/10/2022  ? CREATININE 1.63 (H) 02/10/2022  ? BUN 46 (H) 02/10/2022  ? CO2 31 02/10/2022  ? TSH 11.04 (H) 04/15/2021  ? PSA 2.08 08/24/2020  ? INR 1.1 06/21/2021  ? HGBA1C 6.0 12/28/2021  ? MICROALBUR 1.2 05/18/2017  ? ? ?No results found. ? ?Assessment & Plan:  ? ?Problem List Items Addressed This Visit   ? ? Edema  ?  Worse ?Start furosemide prn ?Use compression socks ?Elevate legs ? ?  ?  ? CKD (chronic kidney disease) stage 3, GFR 30-59 ml/min (HCC)  ?  Hydralazine 10-20 mg 3 times a day for BP>170 as needed ?GFR-31.  No NSAIDs.  Good hydration.  Regular nephrology follow-up visits with labs ?  ?  ? Hemicrania  ?  Temporal artery biopsy is negative for temporal arteritis.  He can  reduce your prednisone to 30 mg a day for 1 week, then go to 20 mg a day, then 10 x 1 week, 5 x 1 week, then step. ? ?  ?  ? Relevant Medications  ? furosemide (LASIX) 40 MG tablet  ?  ? ? ?Meds ordered this enc

## 2022-03-08 NOTE — Assessment & Plan Note (Signed)
Temporal artery biopsy is negative for temporal arteritis. ?He can reduce your prednisone to 30 mg a day for 1 week, then go to 20 mg a day, then 10 x 1 week, 5 x 1 week, then step. ?

## 2022-03-22 DIAGNOSIS — M25561 Pain in right knee: Secondary | ICD-10-CM | POA: Diagnosis not present

## 2022-03-22 DIAGNOSIS — M25551 Pain in right hip: Secondary | ICD-10-CM | POA: Diagnosis not present

## 2022-04-01 DIAGNOSIS — H353211 Exudative age-related macular degeneration, right eye, with active choroidal neovascularization: Secondary | ICD-10-CM | POA: Diagnosis not present

## 2022-04-01 DIAGNOSIS — H353122 Nonexudative age-related macular degeneration, left eye, intermediate dry stage: Secondary | ICD-10-CM | POA: Diagnosis not present

## 2022-04-01 DIAGNOSIS — H04123 Dry eye syndrome of bilateral lacrimal glands: Secondary | ICD-10-CM | POA: Diagnosis not present

## 2022-04-04 ENCOUNTER — Telehealth: Payer: Self-pay | Admitting: *Deleted

## 2022-04-04 DIAGNOSIS — E291 Testicular hypofunction: Secondary | ICD-10-CM

## 2022-04-04 NOTE — Telephone Encounter (Signed)
Pt was on [employer] needing PA for testosterone cypionate. Completed questionnaire w/ (Key: [id number]) Rec;d msg stating " [facility] [employer] is processing your PA request and will respond shortly with next steps"

## 2022-04-06 ENCOUNTER — Other Ambulatory Visit: Payer: Self-pay | Admitting: Internal Medicine

## 2022-04-06 NOTE — Telephone Encounter (Signed)
Check status on PA for testosterone. Rec'd [facility] [employer] is processing your PA request and will respond shortly with next steps. Will check later..Scott Oneal

## 2022-04-06 NOTE — Telephone Encounter (Signed)
Check [place] registry last filled 01/05/2022../lm,b

## 2022-04-08 NOTE — Telephone Encounter (Signed)
Check status of PA received msg " [facility] [employer] is processing your PA request".Marland KitchenJohny Oneal

## 2022-04-11 NOTE — Telephone Encounter (Signed)
Checking status on PA rec'd msg " [facility] [employer] is processing your PA request and will respond shortly with next steps." Will check later.Marland KitchenJohny Chess

## 2022-04-12 DIAGNOSIS — Z6841 Body Mass Index (BMI) 40.0 and over, adult: Secondary | ICD-10-CM | POA: Diagnosis not present

## 2022-04-12 DIAGNOSIS — N183 Chronic kidney disease, stage 3 unspecified: Secondary | ICD-10-CM | POA: Diagnosis not present

## 2022-04-12 DIAGNOSIS — R319 Hematuria, unspecified: Secondary | ICD-10-CM | POA: Diagnosis not present

## 2022-04-12 DIAGNOSIS — N320 Bladder-neck obstruction: Secondary | ICD-10-CM | POA: Diagnosis not present

## 2022-04-12 DIAGNOSIS — N390 Urinary tract infection, site not specified: Secondary | ICD-10-CM | POA: Diagnosis not present

## 2022-04-12 DIAGNOSIS — I129 Hypertensive chronic kidney disease with stage 1 through stage 4 chronic kidney disease, or unspecified chronic kidney disease: Secondary | ICD-10-CM | POA: Diagnosis not present

## 2022-04-19 ENCOUNTER — Encounter: Payer: Self-pay | Admitting: Internal Medicine

## 2022-04-19 ENCOUNTER — Ambulatory Visit (INDEPENDENT_AMBULATORY_CARE_PROVIDER_SITE_OTHER): Payer: PPO | Admitting: Internal Medicine

## 2022-04-19 DIAGNOSIS — R197 Diarrhea, unspecified: Secondary | ICD-10-CM | POA: Diagnosis not present

## 2022-04-19 DIAGNOSIS — M79651 Pain in right thigh: Secondary | ICD-10-CM

## 2022-04-19 DIAGNOSIS — K900 Celiac disease: Secondary | ICD-10-CM | POA: Diagnosis not present

## 2022-04-19 DIAGNOSIS — R634 Abnormal weight loss: Secondary | ICD-10-CM

## 2022-04-19 DIAGNOSIS — E034 Atrophy of thyroid (acquired): Secondary | ICD-10-CM | POA: Diagnosis not present

## 2022-04-19 DIAGNOSIS — M818 Other osteoporosis without current pathological fracture: Secondary | ICD-10-CM

## 2022-04-19 DIAGNOSIS — E291 Testicular hypofunction: Secondary | ICD-10-CM | POA: Diagnosis not present

## 2022-04-19 DIAGNOSIS — R972 Elevated prostate specific antigen [PSA]: Secondary | ICD-10-CM

## 2022-04-19 MED ORDER — METHYLPREDNISOLONE 4 MG PO TBPK: ORAL_TABLET | ORAL | 0 refills | Status: DC

## 2022-04-19 MED ORDER — TRAMADOL-ACETAMINOPHEN 37.5-325 MG PO TABS: 1.0000 | ORAL_TABLET | Freq: Four times a day (QID) | ORAL | 1 refills | Status: DC | PRN

## 2022-04-19 MED ORDER — TESTOSTERONE CYPIONATE 200 MG/ML IM SOLN: INTRAMUSCULAR | 1 refills | Status: DC

## 2022-04-19 NOTE — Patient Instructions (Signed)
Blue-Emu cream -- use 2-3 times a day ? ?

## 2022-04-19 NOTE — Telephone Encounter (Signed)
Previous PA closed out.. Submitted another PA for Testosterone 200 mg. Submitted w/(Key: Andria Frames) Rec'd msg Your information sent to [employer]...[name]

## 2022-04-19 NOTE — Assessment & Plan Note (Signed)
Chronic On Testosterone - pt wants to continue

## 2022-04-19 NOTE — Assessment & Plan Note (Signed)
Better No diarrhea on Creon

## 2022-04-19 NOTE — Assessment & Plan Note (Addendum)
Resolved on Creon - enrolling him in the [place].

## 2022-04-19 NOTE — Assessment & Plan Note (Signed)
enrolling him in the [facility].

## 2022-04-19 NOTE — Progress Notes (Signed)
Subjective:  Patient ID: Scott Oneal, male    DOB: [birth year]  Age: 81 y.o. MRN: [id number]  CC: Follow-up (Left leg pain was so bad today that he had to take one of his wife's tramadol, it was effective enough for him to take a shower and walk around )   HPI Scott Oneal presents for LLE pain - worse. Tramadol helped. F/u on celiac disease, hypogonadism, CRI  Outpatient Medications Prior to Visit  Medication Sig Dispense Refill   acetaminophen (TYLENOL) 500 MG tablet Take 1 tablet (500 mg total) by mouth every 6 (six) hours as needed for up to 30 doses. 30 tablet 0   BOOSTRIX 5-2.5-18.5 LF-MCG/0.5 injection Inject 0.5 mLs into the muscle once.     calcitRIOL (ROCALTROL) 0.25 MCG capsule TAKE 1 CAPSULE BY MOUTH EVERY DAY 90 capsule 1   cetirizine (ZYRTEC) 10 MG tablet Take 10 mg by mouth daily as needed for allergies.     Cholecalciferol 2000 UNITS TABS Take 2,000 Units by mouth daily.     clonazePAM (KLONOPIN) 0.25 MG disintegrating tablet TAKE 1 TABLET (0.25 MG TOTAL) BY MOUTH 2 (TWO) TIMES DAILY AS NEEDED (ANXIETY). 60 tablet 3   diclofenac sodium (VOLTAREN) 1 % GEL APPLY 4 G TOPICALLY 4 TIMES DAILY. (Patient taking differently: Apply 4 g topically 4 (four) times daily as needed (pain).) 200 g 5   furosemide (LASIX) 40 MG tablet Take 1 tablet (40 mg total) by mouth daily as needed. 30 tablet 3   hydrALAZINE (APRESOLINE) 10 MG tablet Take 1-2 tablet 3 times a day as needed if blood pressure is >170 (Patient taking differently: Take 10-20 mg by mouth 3 (three) times daily as needed (blood pressure). Take 1-2 tablet 3 times a day as needed if blood pressure is >170) 60 tablet 3   isosorbide mononitrate (IMDUR) 30 MG 24 hr tablet Take 1 tablet (30 mg total) by mouth daily. 90 tablet 2   levothyroxine (SYNTHROID) 175 MCG tablet TAKE 1 TABLET BY MOUTH EVERY DAY 90 tablet 3   lipase/protease/amylase (CREON) 36000 UNITS CPEP capsule Take 2 capsules (72,000 Units total) by mouth 3 (three)  times daily with meals. May also take 1 capsule (36,000 Units total) as needed (with snacks). 240 capsule 11   Multiple Vitamins-Minerals (PRESERVISION AREDS 2) CAPS Take 1 capsule by mouth in the morning and at bedtime.     mupirocin ointment (BACTROBAN) 2 % Apply 1 application topically 2 (two) times daily. Apply to wounds, cuts to prevent infection (Patient taking differently: Apply 1 application  topically daily as needed (cellulitis flare).) 22 g 0   nitroGLYCERIN (NITROSTAT) 0.4 MG SL tablet Place 0.4 mg under the tongue every 5 (five) minutes as needed for chest pain.     olmesartan (BENICAR) 5 MG tablet TAKE 1 TABLET BY MOUTH EVERY DAY 90 tablet 0   pantoprazole (PROTONIX) 40 MG tablet Take 1 tablet (40 mg total) by mouth 2 (two) times daily. 60 tablet 5   Propylene Glycol (SYSTANE BALANCE) 0.6 % SOLN Place 1 drop into both eyes daily as needed (Dry eye).     simvastatin (ZOCOR) 10 MG tablet TAKE 1 TABLET BY MOUTH EVERYDAY AT BEDTIME (Patient taking differently: Take 10 mg by mouth at bedtime.) 90 tablet 3   SYRINGE-NEEDLE, DISP, 3 ML (BD ECLIPSE SYRINGE) 23G X 1" 3 ML MISC As directed IM 50 each 2   tamsulosin (FLOMAX) 0.4 MG CAPS capsule Take 0.4 mg by mouth daily.  doxycycline (VIBRAMYCIN) 100 MG capsule Take 100 mg by mouth 2 (two) times daily.     predniSONE (DELTASONE) 10 MG tablet Take 4 tablets (40 mg total) by mouth daily with breakfast. 120 tablet 3   carvedilol (COREG) 25 MG tablet TAKE 1 TABLET BY MOUTH TWICE A DAY WITH MEALS (Patient taking differently: Take 25 mg by mouth 2 (two) times daily with a meal.) 180 tablet 1   chlorhexidine (PERIDEX) 0.12 % solution Use as directed 15 mLs in the mouth or throat 2 (two) times daily. (Patient not taking: Reported on 04/19/2022)     UNABLE TO FIND Take 2 tablets by mouth daily. Med Name: [name]     testosterone cypionate (DEPOTESTOSTERONE CYPIONATE) 200 MG/ML injection INJECT 2ML INTO MUSCLE EVERY 2 WEEKS 12 mL 1   No  facility-administered medications prior to visit.    ROS: Review of Systems  Constitutional:  Negative for appetite change, fatigue and unexpected weight change.  HENT:  Negative for congestion, nosebleeds, sneezing, sore throat and trouble swallowing.   Eyes:  Negative for itching and visual disturbance.  Respiratory:  Negative for cough.   Cardiovascular:  Negative for chest pain, palpitations and leg swelling.  Gastrointestinal:  Negative for abdominal distention, blood in stool, diarrhea and nausea.  Genitourinary:  Negative for frequency and hematuria.  Musculoskeletal:  Positive for arthralgias, back pain and gait problem. Negative for joint swelling and neck pain.  Skin:  Negative for rash.  Neurological:  Negative for dizziness, tremors, speech difficulty and weakness.  Psychiatric/Behavioral:  Negative for agitation, dysphoric mood, sleep disturbance and suicidal ideas. The patient is not nervous/anxious.     Objective:  BP (!) 142/84 (BP Location: Left Arm, Patient Position: Sitting, Cuff Size: Normal)   Pulse 69   Temp 98 F (36.7 C)   Resp 18   Ht 6' (1.829 m)   Wt 187 lb 6.4 oz (85 kg)   SpO2 97%   BMI 25.42 kg/m   BP Readings from Last 3 Encounters:  04/19/22 (!) 142/84  03/08/22 118/80  02/18/22 139/74    Wt Readings from Last 3 Encounters:  04/19/22 187 lb 6.4 oz (85 kg)  03/08/22 186 lb (84.4 kg)  02/18/22 190 lb 2 oz (86.2 kg)    Physical Exam Constitutional:      General: He is not in acute distress.    Appearance: Normal appearance. He is well-developed.     Comments: NAD  Eyes:     Conjunctiva/sclera: Conjunctivae normal.     Pupils: Pupils are equal, round, and reactive to light.  Neck:     Thyroid: No thyromegaly.     Vascular: No JVD.  Cardiovascular:     Rate and Rhythm: Normal rate and regular rhythm.     Heart sounds: Normal heart sounds. No murmur heard.    No friction rub. No gallop.  Pulmonary:     Effort: Pulmonary effort is  normal. No respiratory distress.     Breath sounds: Normal breath sounds. No wheezing or rales.  Chest:     Chest wall: No tenderness.  Abdominal:     General: Bowel sounds are normal. There is no distension.     Palpations: Abdomen is soft. There is no mass.     Tenderness: There is no abdominal tenderness. There is no guarding or rebound.  Musculoskeletal:        General: Tenderness present. Normal range of motion.     Cervical back: Normal range of motion.  Lymphadenopathy:     Cervical: No cervical adenopathy.  Skin:    General: Skin is warm and dry.     Findings: No rash.  Neurological:     Mental Status: He is alert and oriented to person, place, and time.     Cranial Nerves: No cranial nerve deficit.     Motor: No abnormal muscle tone.     Coordination: Coordination normal.     Gait: Gait normal.     Deep Tendon Reflexes: Reflexes are normal and symmetric.  Psychiatric:        Behavior: Behavior normal.        Thought Content: Thought content normal.        Judgment: Judgment normal.   R lat hip w/pain Small new clean abrasions L lower leg - dressed w/abx oint    A total time of 45 minutes was spent preparing to see the patient, reviewing tests, x-rays, operative reports and other medical records.  Also, obtaining history and performing comprehensive physical exam.  Additionally, counseling the patient regarding the above listed issues.   Finally, documenting clinical information in the health records, coordination of care, educating the patient, enrolling him in the [facility].   Lab Results  Component Value Date   WBC 6.6 02/10/2022   HGB 15.3 02/10/2022   HCT 47.7 02/10/2022   PLT 57 (L) 02/10/2022   GLUCOSE 111 (H) 02/10/2022   CHOL 115 02/18/2020   TRIG 115 02/18/2020   HDL 34 (L) 02/18/2020   LDLCALC 60 02/18/2020   ALT 17 12/28/2021   AST 15 12/28/2021   NA 141 02/10/2022   K 4.3 02/10/2022   CL 106 02/10/2022   CREATININE 1.63 (H)  02/10/2022   BUN 46 (H) 02/10/2022   CO2 31 02/10/2022   TSH 11.04 (H) 04/15/2021   PSA 2.08 08/24/2020   INR 1.1 06/21/2021   HGBA1C 6.0 12/28/2021   MICROALBUR 1.2 05/18/2017    No results found.  Assessment & Plan:   Problem List Items Addressed This Visit     Diarrhea    Resolved on Creon      Hypogonadism in male    Chronic On Testosterone - pt wants to continue      Hypothyroidism    Chronic Cont on Levothroid      Osteoporosis    On testosterone, Vit D Off steroids      PSA, INCREASED    Monitoring PSA      Thigh pain    R leg pain - sciatica vs other R leg pain - sciatica vs other        WEIGHT LOSS    Better No diarrhea on Creon         Meds ordered this encounter  Medications   testosterone cypionate (DEPOTESTOSTERONE CYPIONATE) 200 MG/ML injection    Sig: INJECT 2ML INTO MUSCLE EVERY 2 WEEKS    Dispense:  12 mL    Refill:  1    Not to exceed 2 additional fills before 04/09/2022   traMADol-acetaminophen (ULTRACET) 37.5-325 MG tablet    Sig: Take 1-2 tablets by mouth every 6 (six) hours as needed for up to 10 days.    Dispense:  40 tablet    Refill:  1   methylPREDNISolone (MEDROL DOSEPAK) 4 MG TBPK tablet    Sig: As directed    Dispense:  21 tablet    Refill:  0      Follow-up: Return in about 6 weeks (around  05/31/2022) for a follow-up visit.  Walker Kehr, MD

## 2022-04-19 NOTE — Assessment & Plan Note (Signed)
Chronic Cont on Levothroid

## 2022-04-19 NOTE — Assessment & Plan Note (Signed)
R leg pain - sciatica vs other R leg pain - sciatica vs other

## 2022-04-19 NOTE — Assessment & Plan Note (Signed)
Monitoring PSA

## 2022-04-19 NOTE — Assessment & Plan Note (Signed)
On testosterone, Vit D Off steroids

## 2022-04-20 ENCOUNTER — Other Ambulatory Visit: Payer: Self-pay | Admitting: Internal Medicine

## 2022-04-20 MED ORDER — TRAMADOL HCL 50 MG PO TABS: 50.0000 mg | ORAL_TABLET | Freq: Four times a day (QID) | ORAL | 1 refills | Status: AC | PRN

## 2022-04-22 ENCOUNTER — Ambulatory Visit: Payer: PPO

## 2022-04-22 ENCOUNTER — Telehealth: Payer: Self-pay | Admitting: Internal Medicine

## 2022-04-25 ENCOUNTER — Other Ambulatory Visit: Payer: Self-pay

## 2022-04-25 DIAGNOSIS — K8689 Other specified diseases of pancreas: Secondary | ICD-10-CM

## 2022-04-25 DIAGNOSIS — K219 Gastro-esophageal reflux disease without esophagitis: Secondary | ICD-10-CM

## 2022-04-25 MED ORDER — PANCRELIPASE (LIP-PROT-AMYL) 36000-114000 UNITS PO CPEP: ORAL_CAPSULE | ORAL | 11 refills | Status: DC

## 2022-04-28 ENCOUNTER — Telehealth: Payer: Self-pay | Admitting: Internal Medicine

## 2022-04-28 NOTE — Telephone Encounter (Signed)
[facility] called and states PA was faxed to them for testosterone cypionate (DEPOTESTOSTERONE CYPIONATE) 200 MG/ML injection.   States it was cut off- needs it refaxed.   Please send to [phone].

## 2022-04-28 NOTE — Telephone Encounter (Signed)
Re faxed questionnaire to # given. Close this encounter bcz we have a previous PA telephone encounter open.Marland KitchenJohny Oneal

## 2022-04-28 NOTE — Telephone Encounter (Signed)
Rec'f PA fax from [place] PA dept. Completed and faxed back w/ last ov.Marland KitchenJohny Chess

## 2022-04-28 NOTE — Telephone Encounter (Signed)
Called [employer] spoke w/rep she inform that the PA was received on 04/19/22. Gave # to PA dept w/[employer]. Spoken w/rep he states they never received PA from [employer]. He is faxing over PA fpr testosterone [phone].Marland KitchenJohny Chess

## 2022-04-29 NOTE — Telephone Encounter (Signed)
Rec'd determination fax med was DENIED. It states Medicare Part D denied your request because we didn't get enough information, Have not been tested for testosterone in over than 12 months. Fax denial to pof...[name]

## 2022-05-01 NOTE — Addendum Note (Signed)
Addended by: Cassandria Anger on: 05/01/2022 11:36 PM   Modules accepted: Orders

## 2022-05-01 NOTE — Telephone Encounter (Signed)
I will order lab work.  However, she probably had it done with Dr. Diona Fanti (urology).  Thanks

## 2022-05-10 ENCOUNTER — Telehealth: Payer: Self-pay | Admitting: Internal Medicine

## 2022-05-10 NOTE — Telephone Encounter (Signed)
LVM for pt to rtn my call to schedule AWV with [facility] call back # [phone] 

## 2022-05-12 NOTE — Telephone Encounter (Signed)
Rec'd fax med has been DENIED. It states payment under your plan Medicare D  benefit criteria has not been met. Pt has not had testosterone check in over year. MD informed see previous phone note. Will check testosterone next visit...[name]

## 2022-05-31 ENCOUNTER — Ambulatory Visit (INDEPENDENT_AMBULATORY_CARE_PROVIDER_SITE_OTHER): Payer: PPO | Admitting: Internal Medicine

## 2022-05-31 ENCOUNTER — Encounter: Payer: Self-pay | Admitting: Internal Medicine

## 2022-05-31 DIAGNOSIS — I10 Essential (primary) hypertension: Secondary | ICD-10-CM

## 2022-05-31 DIAGNOSIS — M353 Polymyalgia rheumatica: Secondary | ICD-10-CM | POA: Diagnosis not present

## 2022-05-31 DIAGNOSIS — E034 Atrophy of thyroid (acquired): Secondary | ICD-10-CM | POA: Diagnosis not present

## 2022-05-31 MED ORDER — METHYLPREDNISOLONE ACETATE 80 MG/ML IJ SUSP
80.0000 mg | Freq: Once | INTRAMUSCULAR | Status: AC
  Administered 2022-05-31: 80 mg via INTRAMUSCULAR

## 2022-05-31 MED ORDER — PREDNISONE 10 MG PO TABS: 20.0000 mg | ORAL_TABLET | Freq: Every day | ORAL | 3 refills | Status: DC

## 2022-05-31 NOTE — Progress Notes (Signed)
Subjective:  Patient ID: Scott Oneal, male    DOB: [birth year]  Age: 82 y.o. MRN: [id number]  CC: No chief complaint on file.   HPI CARLITO BOGERT presents for weakness, arthralgias, stiffness x 2 weeks Follow-up on hypertension, hypothyroidism  Outpatient Medications Prior to Visit  Medication Sig Dispense Refill   acetaminophen (TYLENOL) 500 MG tablet Take 1 tablet (500 mg total) by mouth every 6 (six) hours as needed for up to 30 doses. 30 tablet 0   calcitRIOL (ROCALTROL) 0.25 MCG capsule TAKE 1 CAPSULE BY MOUTH EVERY DAY 90 capsule 1   cetirizine (ZYRTEC) 10 MG tablet Take 10 mg by mouth daily as needed for allergies.     Cholecalciferol 2000 UNITS TABS Take 2,000 Units by mouth daily.     clonazePAM (KLONOPIN) 0.25 MG disintegrating tablet TAKE 1 TABLET (0.25 MG TOTAL) BY MOUTH 2 (TWO) TIMES DAILY AS NEEDED (ANXIETY). 60 tablet 3   diclofenac sodium (VOLTAREN) 1 % GEL APPLY 4 G TOPICALLY 4 TIMES DAILY. (Patient taking differently: Apply 4 g topically 4 (four) times daily as needed (pain).) 200 g 5   hydrALAZINE (APRESOLINE) 10 MG tablet Take 1-2 tablet 3 times a day as needed if blood pressure is >170 60 tablet 3   isosorbide mononitrate (IMDUR) 30 MG 24 hr tablet Take 1 tablet (30 mg total) by mouth daily. 90 tablet 2   levothyroxine (SYNTHROID) 175 MCG tablet TAKE 1 TABLET BY MOUTH EVERY DAY 90 tablet 3   lipase/protease/amylase (CREON) 36000 UNITS CPEP capsule Take 2 capsules (72,000 Units total) by mouth 3 (three) times daily with meals. May also take 1 capsule (36,000 Units total) as needed (with snacks). 240 capsule 11   Multiple Vitamins-Minerals (PRESERVISION AREDS 2) CAPS Take 1 capsule by mouth in the morning and at bedtime.     nitroGLYCERIN (NITROSTAT) 0.4 MG SL tablet Place 0.4 mg under the tongue every 5 (five) minutes as needed for chest pain.     pantoprazole (PROTONIX) 40 MG tablet Take 1 tablet (40 mg total) by mouth 2 (two) times daily. (Patient taking differently:  Take 40 mg by mouth daily.) 60 tablet 5   Propylene Glycol (SYSTANE BALANCE) 0.6 % SOLN Place 1 drop into both eyes daily as needed (Dry eye).     SYRINGE-NEEDLE, DISP, 3 ML (BD ECLIPSE SYRINGE) 23G X 1" 3 ML MISC As directed IM 50 each 2   tamsulosin (FLOMAX) 0.4 MG CAPS capsule Take 0.4 mg by mouth daily.     testosterone cypionate (DEPOTESTOSTERONE CYPIONATE) 200 MG/ML injection INJECT 2ML INTO MUSCLE EVERY 2 WEEKS 12 mL 1   BOOSTRIX 5-2.5-18.5 LF-MCG/0.5 injection Inject 0.5 mLs into the muscle once. (Patient not taking: Reported on 06/08/2022)     carvedilol (COREG) 25 MG tablet TAKE 1 TABLET BY MOUTH TWICE A DAY WITH MEALS (Patient taking differently: Take 25 mg by mouth 2 (two) times daily with a meal.) 180 tablet 1   chlorhexidine (PERIDEX) 0.12 % solution Use as directed 15 mLs in the mouth or throat 2 (two) times daily. (Patient not taking: Reported on 06/08/2022)     furosemide (LASIX) 40 MG tablet Take 1 tablet (40 mg total) by mouth daily as needed. (Patient not taking: Reported on 06/08/2022) 30 tablet 3   methylPREDNISolone (MEDROL DOSEPAK) 4 MG TBPK tablet As directed 21 tablet 0   mupirocin ointment (BACTROBAN) 2 % Apply 1 application topically 2 (two) times daily. Apply to wounds, cuts to prevent infection (Patient not taking:  Reported on 06/29/2022) 22 g 0   olmesartan (BENICAR) 5 MG tablet TAKE 1 TABLET BY MOUTH EVERY DAY 90 tablet 0   simvastatin (ZOCOR) 10 MG tablet TAKE 1 TABLET BY MOUTH EVERYDAY AT BEDTIME 90 tablet 3   UNABLE TO FIND Take 2 tablets by mouth daily. Med Name: Scott Oneal     [facility] COVID-19 [facility] BIVALENT injection  (Patient not taking: Reported on 06/08/2022)     PREVNAR 20 0.5 ML injection      No facility-administered medications prior to visit.    ROS: Review of Systems  Constitutional:  Positive for fatigue.    Objective:  BP 122/60 (BP Location: Left Arm, Patient Position: Sitting, Cuff Size: Normal)   Pulse 64   Temp 98.9 F (37.2 C) (Oral)   Ht 6'  (1.829 m)   Wt 179 lb (81.2 kg)   SpO2 100%   BMI 24.28 kg/m   BP Readings from Last 3 Encounters:  06/29/22 118/60  06/25/22 135/65  06/23/22 (!) 140/80    Wt Readings from Last 3 Encounters:  06/29/22 188 lb 9.6 oz (85.5 kg)  06/23/22 186 lb (84.4 kg)  06/14/22 180 lb (81.6 kg)    Physical Exam Constitutional:      General: He is not in acute distress.    Appearance: He is well-developed.     Comments: NAD  Eyes:     Conjunctiva/sclera: Conjunctivae normal.     Pupils: Pupils are equal, round, and reactive to light.  Neck:     Thyroid: No thyromegaly.     Vascular: No JVD.  Cardiovascular:     Rate and Rhythm: Normal rate and regular rhythm.     Heart sounds: Normal heart sounds. No murmur heard.    No friction rub. No gallop.  Pulmonary:     Effort: Pulmonary effort is normal. No respiratory distress.     Breath sounds: Normal breath sounds. No wheezing or rales.  Chest:     Chest wall: No tenderness.  Abdominal:     General: Bowel sounds are normal. There is no distension.     Palpations: Abdomen is soft. There is no mass.     Tenderness: There is no abdominal tenderness. There is no guarding or rebound.  Musculoskeletal:        General: No tenderness. Normal range of motion.     Cervical back: Normal range of motion.  Lymphadenopathy:     Cervical: No cervical adenopathy.  Skin:    General: Skin is warm and dry.     Findings: No rash.  Neurological:     Mental Status: He is alert and oriented to person, place, and time.     Cranial Nerves: No cranial nerve deficit.     Motor: No abnormal muscle tone.     Coordination: Coordination normal.     Gait: Gait normal.     Deep Tendon Reflexes: Reflexes are normal and symmetric.  Psychiatric:        Behavior: Behavior normal.        Thought Content: Thought content normal.        Judgment: Judgment normal.     Lab Results  Component Value Date   WBC 4.9 06/25/2022   HGB 12.8 (L) 06/25/2022   HCT 39.8  06/25/2022   PLT 41 (L) 06/25/2022   GLUCOSE 106 (H) 06/25/2022   CHOL 143 06/25/2022   TRIG 88 06/25/2022   HDL 42 06/25/2022   LDLCALC 83 06/25/2022   ALT  29 06/15/2022   AST 21 06/15/2022   NA 140 06/25/2022   K 4.8 06/25/2022   CL 112 (H) 06/25/2022   CREATININE 1.72 (H) 06/25/2022   BUN 39 (H) 06/25/2022   CO2 22 06/25/2022   TSH 11.04 (H) 04/15/2021   PSA 2.08 08/24/2020   INR 1.3 (H) 06/23/2022   HGBA1C 6.0 12/28/2021   MICROALBUR 1.2 05/18/2017    No results found.  Assessment & Plan:   Problem List Items Addressed This Visit     Essential hypertension    Continue with Coreg, hydralazine, Benicar      Hypothyroidism    Continue on levothyroxine      PMR (polymyalgia rheumatica) (HCC)    Probable Start Prednisone  Potential benefits of a long term steroid  use as well as potential risks  and complications were explained to the patient and were aknowledged. Start CoQ10            Meds ordered this encounter  Medications   DISCONTD: predniSONE (DELTASONE) 10 MG tablet    Sig: Take 2 tablets (20 mg total) by mouth daily with breakfast.    Dispense:  60 tablet    Refill:  3   methylPREDNISolone acetate (DEPO-MEDROL) injection 80 mg      Follow-up: Return in about 2 weeks (around 06/14/2022) for a follow-up visit.  Walker Kehr, MD

## 2022-05-31 NOTE — Assessment & Plan Note (Signed)
Probable Start Prednisone  Potential benefits of a long term steroid  use as well as potential risks  and complications were explained to the patient and were aknowledged. Start CoQ10

## 2022-05-31 NOTE — Patient Instructions (Signed)
Take CoQ10

## 2022-06-08 ENCOUNTER — Ambulatory Visit: Payer: PPO | Admitting: Cardiovascular Disease

## 2022-06-08 ENCOUNTER — Encounter: Payer: Self-pay | Admitting: Cardiovascular Disease

## 2022-06-08 VITALS — BP 124/66 | HR 70 | Ht 72.0 in | Wt 179.0 lb

## 2022-06-08 DIAGNOSIS — I2510 Atherosclerotic heart disease of native coronary artery without angina pectoris: Secondary | ICD-10-CM

## 2022-06-08 DIAGNOSIS — E782 Mixed hyperlipidemia: Secondary | ICD-10-CM | POA: Diagnosis not present

## 2022-06-08 DIAGNOSIS — I10 Essential (primary) hypertension: Secondary | ICD-10-CM

## 2022-06-08 DIAGNOSIS — N184 Chronic kidney disease, stage 4 (severe): Secondary | ICD-10-CM | POA: Diagnosis not present

## 2022-06-08 NOTE — Progress Notes (Signed)
Cardiology Office Note:    Date:  06/08/2022   ID:  Scott Oneal, DOB 06-08-1941, MRN [id number]  PCP:  Cassandria Anger, MD   [facility] Providers Cardiologist:  Sherren Mocha, MD     Referring MD: Cassandria Anger, MD   Chief Complaint  Patient presents with   Coronary Artery Disease    History of Present Illness:    Scott Oneal is a 81 y.o. male with a hx of: Coronary artery disease S/p inferior MI in 2005 >> PCI: overlapping DES to RCA Staged PCI: DES to LCx Cath 4/21: LCx and RCA stents patent w mod ISR, mod mid and dist LAD dz >> med rx Combined Systolic and Diastolic CHF Echocardiogram 12/2019: EF 45-50, Gr 2 DD Chronic kidney disease Stage 4 Hypertension Hyperlipidemia  The patient is here alone today. He is doing very well. He is going to [employer] a few times per week and doing his yard work without exertional symptoms. He takes his time and states that he's 'slowed down' quite a bit over the last several years. He denies chest pain, chest pressure, edema, or dyspnea.  He backed off on some of his antihypertensive medications as he has lost weight over the past few years and was experiencing some low blood pressure also with progressive renal disease noted.  It appears that his renal function has stabilized with medication adjustments and he is followed closely by nephrology.  Past Medical History:  Diagnosis Date   Allergy    Arthritis    Calculus of gallbladder without mention of cholecystitis    Cataract    bilateral cateracts removed   Celiac disease    Chronic kidney disease    stage 3   Coronary atherosclerosis of unspecified type of vessel, native or graft    Dermatitis 06/08/2021   Erythema of lower extremity 09/08/2021   Esophageal reflux    Glaucoma    History of cellulitis 09/08/2021   History of kidney stones    Loss of weight    Lower extremity weakness 05/31/2021   Macular degeneration    Rt eye . injections    Old myocardial infarction    Osteoporosis, unspecified    Other malaise and fatigue    Other specified cardiac dysrhythmias(427.89)    Other testicular hypofunction    Pancreatic insufficiency 05/31/2021   Personal history of colonic polyps    Postural dizziness with presyncope 12/13/2021   Pure hypercholesterolemia    Thrombocytopenia ([place])    chronic, per PCP notes dating back to 2009   Tinea pedis 05/31/2021   Unspecified asthma(493.90)    Unspecified essential hypertension    Unspecified hypothyroidism    Venous stasis dermatitis 09/08/2021    Past Surgical History:  Procedure Laterality Date   ARTERY BIOPSY Left 02/18/2022   Procedure: LEFT TEMPORAL ARTERY BIOPSY;  Surgeon: Kieth Brightly Arta Bruce, MD;  Location: [facility];  Service: General;  Laterality: Left;   cataract surgery Bilateral    COLONOSCOPY  06/26/2017   [facility]   COLONOSCOPY WITH PROPOFOL N/A 11/27/2014   Procedure: COLONOSCOPY WITH PROPOFOL;  Surgeon: Milus Banister, MD;  Location: [facility];  Service: Endoscopy;  Laterality: N/A;   CORONARY ANGIOPLASTY     CORONARY STENT PLACEMENT  2011   Cypher; in distal circumflex artery   CYSTOSCOPY  1998   IR URETERAL STENT LEFT NEW ACCESS W/O SEP NEPHROSTOMY CATH  06/21/2021   KNEE SURGERY     right   LEFT  HEART CATH AND CORONARY ANGIOGRAPHY N/A 01/31/2020   Procedure: LEFT HEART CATH AND CORONARY ANGIOGRAPHY;  Surgeon: Belva Crome, MD;  Location: [facility];  Service: Cardiovascular;  Laterality: N/A;   NEPHROLITHOTOMY Left 06/21/2021   Procedure: NEPHROLITHOTOMY PERCUTANEOUS;  Surgeon: Franchot Gallo, MD;  Location: [facility];  Service: Urology;  Laterality: Left;  90 MINS   SHOULDER SURGERY Left 07/2013   Dr Shara Blazing   UPPER GASTROINTESTINAL ENDOSCOPY      Current Medications: Current Meds  Medication Sig   acetaminophen (TYLENOL) 500 MG tablet Take 1 tablet (500 mg total) by mouth every 6 (six) hours as needed for up to 30 doses.   calcitRIOL  (ROCALTROL) 0.25 MCG capsule TAKE 1 CAPSULE BY MOUTH EVERY DAY   carvedilol (COREG) 25 MG tablet Take 12.5 mg by mouth 2 (two) times daily with a meal.   cetirizine (ZYRTEC) 10 MG tablet Take 10 mg by mouth daily as needed for allergies.   Cholecalciferol 2000 UNITS TABS Take 2,000 Units by mouth daily.   clonazePAM (KLONOPIN) 0.25 MG disintegrating tablet TAKE 1 TABLET (0.25 MG TOTAL) BY MOUTH 2 (TWO) TIMES DAILY AS NEEDED (ANXIETY).   diclofenac sodium (VOLTAREN) 1 % GEL APPLY 4 G TOPICALLY 4 TIMES DAILY. (Patient taking differently: Apply 4 g topically 4 (four) times daily as needed (pain).)   hydrALAZINE (APRESOLINE) 10 MG tablet Take 1-2 tablet 3 times a day as needed if blood pressure is >170   isosorbide mononitrate (IMDUR) 30 MG 24 hr tablet Take 1 tablet (30 mg total) by mouth daily.   levothyroxine (SYNTHROID) 175 MCG tablet TAKE 1 TABLET BY MOUTH EVERY DAY   lipase/protease/amylase (CREON) 36000 UNITS CPEP capsule Take 2 capsules (72,000 Units total) by mouth 3 (three) times daily with meals. May also take 1 capsule (36,000 Units total) as needed (with snacks).   Multiple Vitamins-Minerals (PRESERVISION AREDS 2) CAPS Take 1 capsule by mouth in the morning and at bedtime.   mupirocin ointment (BACTROBAN) 2 % Apply 1 application topically 2 (two) times daily. Apply to wounds, cuts to prevent infection (Patient taking differently: Apply 1 application  topically daily as needed (cellulitis flare).)   nitroGLYCERIN (NITROSTAT) 0.4 MG SL tablet Place 0.4 mg under the tongue every 5 (five) minutes as needed for chest pain.   olmesartan (BENICAR) 5 MG tablet TAKE 1 TABLET BY MOUTH EVERY DAY   pantoprazole (PROTONIX) 40 MG tablet Take 1 tablet (40 mg total) by mouth 2 (two) times daily.   pneumococcal 20-valent conjugate vaccine (PREVNAR 20) 0.5 ML injection Inject 0.5 mLs into the muscle once.   predniSONE (DELTASONE) 10 MG tablet Take 10 mg by mouth daily with breakfast.   PREVNAR 20 0.5 ML  injection    Propylene Glycol (SYSTANE BALANCE) 0.6 % SOLN Place 1 drop into both eyes daily as needed (Dry eye).   simvastatin (ZOCOR) 10 MG tablet TAKE 1 TABLET BY MOUTH EVERYDAY AT BEDTIME   SYRINGE-NEEDLE, DISP, 3 ML (BD ECLIPSE SYRINGE) 23G X 1" 3 ML MISC As directed IM   tamsulosin (FLOMAX) 0.4 MG CAPS capsule Take 0.4 mg by mouth daily.   testosterone cypionate (DEPOTESTOSTERONE CYPIONATE) 200 MG/ML injection INJECT 2ML INTO MUSCLE EVERY 2 WEEKS   UNABLE TO FIND Take 2 tablets by mouth daily. Med Name: [name]   [DISCONTINUED] carvedilol (COREG) 25 MG tablet TAKE 1 TABLET BY MOUTH TWICE A DAY WITH MEALS (Patient taking differently: Take 25 mg by mouth 2 (two) times daily with a  meal.)     Allergies:   Biaxin [clarithromycin], Tizanidine, Allantoin-pramoxine, Amlodipine, Amoxicillin, Benzalkonium chloride, Doxycycline, and Metoprolol tartrate   Social History   Socioeconomic History   Marital status: Married    Spouse name: Walid Haig   Number of children: 3   Years of education: Not on file   Highest education level: Not on file  Occupational History   Occupation: retired    Fish farm manager: RETIRED    Comment: Engineering  Tobacco Use   Smoking status: Never   Smokeless tobacco: Never  Vaping Use   Vaping Use: Never used  Substance and Sexual Activity   Alcohol use: No   Drug use: No   Sexual activity: Yes  Other Topics Concern   Not on file  Social History Narrative   Retired.   Regular Exercise-Yes; yoga & silver sneakers   Daily Caffeine Use.            Social Determinants of Health   Financial Resource Strain: Low Risk  (04/21/2021)   Overall Financial Resource Strain (CARDIA)    Difficulty of Paying Living Expenses: Not hard at all  Food Insecurity: No Food Insecurity (04/21/2021)   Hunger Vital Sign    Worried About Running Out of Food in the Last Year: Never true    Ran Out of Food in the Last Year: Never true  Transportation Needs: No Transportation  Needs (04/21/2021)   PRAPARE - Hydrologist (Medical): No    Lack of Transportation (Non-Medical): No  Physical Activity: Sufficiently Active (04/21/2021)   Exercise Vital Sign    Days of Exercise per Week: 5 days    Minutes of Exercise per Session: 30 min  Stress: No Stress Concern Present (04/21/2021)   [place]    Feeling of Stress : Not at all  Social Connections: [place] (04/21/2021)   Social Connection and Isolation Panel [NHANES]    Frequency of Communication with Friends and Family: More than three times a week    Frequency of Social Gatherings with Friends and Family: More than three times a week    Attends Religious Services: More than 4 times per year    Active Member of [employer] or Organizations: Yes    Attends Music therapist: More than 4 times per year    Marital Status: Married     Family History: The patient's family history includes Colon cancer (age of onset: 61) in his son; Hypertension in an other family member; Lymphoma in his father; Vision loss in his maternal grandmother. There is no history of Asthma, Esophageal cancer, Rectal cancer, Stomach cancer, or Colon polyps.  ROS:   Please see the history of present illness.    All other systems reviewed and are negative.  EKGs/Labs/Other Studies Reviewed:    EKG:  EKG is not ordered today.   Recent Labs: 12/28/2021: ALT 17 02/10/2022: BUN 46; Creatinine, Ser 1.63; Hemoglobin 15.3; Platelets 57; Potassium 4.3; Sodium 141  Recent Lipid Panel    Component Value Date/Time   CHOL 115 02/18/2020 0740   TRIG 115 02/18/2020 0740   HDL 34 (L) 02/18/2020 0740   CHOLHDL 3.4 02/18/2020 0740   CHOLHDL 3 05/18/2017 0721   VLDL 16.4 05/18/2017 0721   LDLCALC 60 02/18/2020 0740     Risk Assessment/Calculations:           Physical Exam:    VS:  BP 124/66   Pulse 70  Ht 6' (1.829 m)   Wt 179 lb  (81.2 kg)   SpO2 98%   BMI 24.28 kg/m     Wt Readings from Last 3 Encounters:  06/08/22 179 lb (81.2 kg)  05/31/22 179 lb (81.2 kg)  04/19/22 187 lb 6.4 oz (85 kg)     GEN:  Well nourished, well developed pleasant elderly male in  no acute distress HEENT: Normal NECK: No JVD; No carotid bruits LYMPHATICS: No lymphadenopathy CARDIAC: RRR, no murmurs, rubs, gallops RESPIRATORY:  Clear to auscultation without rales, wheezing or rhonchi  ABDOMEN: Soft, non-tender, non-distended MUSCULOSKELETAL:  No edema; No deformity  SKIN: Warm and dry NEUROLOGIC:  Alert and oriented x 3 PSYCHIATRIC:  Normal affect   ASSESSMENT:    1. Mixed hyperlipidemia   2. Coronary artery disease involving native coronary artery of native heart without angina pectoris   3. Essential hypertension   4. CKD (chronic kidney disease) stage 4, GFR 15-29 ml/min (HCC)    PLAN:    In order of problems listed above:  The patient is treated with low-dose simvastatin.  His last lipids from 2021 show a cholesterol of 115, HDL 34, LDL 60.  Will update his labs with repeat lipids and LFTs.  Low threshold to switch him over to a high intensity statin drug if LDL greater than 70 mg/dL. The patient is stable on his current medical regimen which includes carvedilol and isosorbide. Blood pressure is well-controlled.  He has reduced some of his blood pressure medicines due to significant weight loss.  Overall doing very well. Reviewed his labs and appears that he has had stabilization and some improvement in his renal function with a recent creatinine of 1.94 mg/dL.  He is followed closely by Dr. Moshe Cipro.           Medication Adjustments/Labs and Tests Ordered: Current medicines are reviewed at length with the patient today.  Concerns regarding medicines are outlined above.  Orders Placed This Encounter  Procedures   Lipid panel   Hepatic function panel   No orders of the defined types were placed in this  encounter.   Patient Instructions  Medication Instructions:  Your physician recommends that you continue on your current medications as directed. Please refer to the Current Medication list given to you today.  *If you need a refill on your cardiac medications before your next appointment, please call your pharmacy*   Lab Work: LIPIDS, LIVER this month If you have labs (blood work) drawn today and your tests are completely normal, you will receive your results only by: [place] (if you have MyChart) OR A paper copy in the mail If you have any lab test that is abnormal or we need to change your treatment, we will call you to review the results.   Testing/Procedures: NONE   Follow-Up: At [facility], you and your health needs are our priority.  As part of our continuing mission to provide you with exceptional heart care, we have created designated Provider Care Teams.  These Care Teams include your primary Cardiologist (physician) and Advanced Practice Providers (APPs -  Physician Assistants and Nurse Practitioners) who all work together to provide you with the care you need, when you need it.  We recommend signing up for the patient portal called "MyChart".  Sign up information is provided on this After Visit Summary.  MyChart is used to connect with patients for Virtual Visits (Telemedicine).  Patients are able to view lab/test results, encounter notes, upcoming appointments,  etc.  Non-urgent messages can be sent to your provider as well.   To learn more about what you can do with MyChart, go to [email or web address].    Your next appointment:   1 year(s)  The format for your next appointment:   In Person  Provider:   Sherren Mocha, MD       Important Information About Sugar         Signed, Sherren Mocha, MD  06/08/2022 11:59 AM    [place]

## 2022-06-08 NOTE — Patient Instructions (Signed)
Medication Instructions:  Your physician recommends that you continue on your current medications as directed. Please refer to the Current Medication list given to you today.  *If you need a refill on your cardiac medications before your next appointment, please call your pharmacy*   Lab Work: LIPIDS, LIVER this month If you have labs (blood work) drawn today and your tests are completely normal, you will receive your results only by: [place] (if you have MyChart) OR A paper copy in the mail If you have any lab test that is abnormal or we need to change your treatment, we will call you to review the results.   Testing/Procedures: NONE   Follow-Up: At [facility], you and your health needs are our priority.  As part of our continuing mission to provide you with exceptional heart care, we have created designated Provider Care Teams.  These Care Teams include your primary Cardiologist (physician) and Advanced Practice Providers (APPs -  Physician Assistants and Nurse Practitioners) who all work together to provide you with the care you need, when you need it.  We recommend signing up for the patient portal called "MyChart".  Sign up information is provided on this After Visit Summary.  MyChart is used to connect with patients for Virtual Visits (Telemedicine).  Patients are able to view lab/test results, encounter notes, upcoming appointments, etc.  Non-urgent messages can be sent to your provider as well.   To learn more about what you can do with MyChart, go to [email or web address].    Your next appointment:   1 year(s)  The format for your next appointment:   In Person  Provider:   Sherren Mocha, MD       Important Information About Sugar

## 2022-06-13 ENCOUNTER — Other Ambulatory Visit: Payer: Self-pay

## 2022-06-13 ENCOUNTER — Other Ambulatory Visit: Payer: PPO

## 2022-06-13 ENCOUNTER — Encounter: Payer: Self-pay | Admitting: Infectious Disease

## 2022-06-13 ENCOUNTER — Ambulatory Visit (INDEPENDENT_AMBULATORY_CARE_PROVIDER_SITE_OTHER): Payer: PPO | Admitting: Infectious Disease

## 2022-06-13 VITALS — BP 107/71 | HR 75 | Temp 98.1°F | Resp 16 | Wt 181.0 lb

## 2022-06-13 DIAGNOSIS — L0390 Cellulitis, unspecified: Secondary | ICD-10-CM

## 2022-06-13 DIAGNOSIS — I872 Venous insufficiency (chronic) (peripheral): Secondary | ICD-10-CM

## 2022-06-13 DIAGNOSIS — K8689 Other specified diseases of pancreas: Secondary | ICD-10-CM | POA: Diagnosis not present

## 2022-06-13 DIAGNOSIS — N200 Calculus of kidney: Secondary | ICD-10-CM

## 2022-06-13 NOTE — Progress Notes (Signed)
Subjective:   Chief complaint: Follow-up for venous stasis dermatitis questionable cellulitis also complaining of back pain   Patient ID: Scott Oneal, male    DOB: 18-Jul-1941, 81 y.o.   MRN: [id number]  Scott Oneal is a  81 year-old Caucasian man with multiple medical problems including chronic diarrhea that sounds as if it is due to pancreatic insufficiency as he says it is improved now that he began pancreatic enzyme replacement.  He also has coronary artery disease and a history of cholelithiasis and also a lifelong history of kidney stones.  Scott Oneal told me that he first developed problems with his left lower extremity in June of 201 He had been working outside extensively in the yard and came back in and noticed erythema and swelling of his leg.  He lay back in recliner and fell asleep that evening.  The next day when he attempted to get out of bed he was too weak to get out of the recliner.  He was ultimately hospitalized at [facility] and stayed there for 4 days.  I did not have access to his medical records but he shows me a photograph of one of the antibiotics he was given as an inpatient which was in fact vancomycin.  He was ultimately discharged on doxycycline and Augmentin and saw his primary care physician Dr. Alain Oneal on April 05, 2021.  According to Dr. Alain Oneal the patient had relapse of his left lower extremity redness with pain and swelling.  [place]  Dr Scott Oneal tried to acquire Scott Oneal for the patient but it was cost-prohibitive.   Doxycycline was given in the interim.  He was referred to [place] in infectious disease.  He told me that he did not have much in the improvement in the way of his erythema while on the doxycycline.  He then developed a rash all over his forearms and his face that was highly consistent with a sun exposure rash on doxycycline at his visit is quite painful and quite erythematous in the sun exposed  regions.  His diarrhea improved while he has been on his pancreatic replacement enzymes.  In questioning him he also mentioned history of his left leg giving out from underneath him at times.  He has had an MRI of the lumbar spine this spring but not since he began having the symptoms of his leg giving out from underneath him as he describes them to me today.  His left lower extremity did haves ome erythema and is quite tender in some of the areas around this.  He did not show evidence of systemic infection with fevers chills nausea vomiting or anorexia.   I ordered plain films of his leg which I personally reviewed show no evidence of osteomyelitis.  Films of the spine are also ordered and is reviewed personally and they show degenerative disc disease and facet hypertrophy in the lumbar spine with bilateral kidney stones.   After his first visit he was initially scheduled to to see me in several weeks time but was concerned over the weekend prior to the last visit when there was an area on the posterior of his leg that became more erythematous.  He was  still bothered also on some about some areas on the anterior the legs that are still erythematous.  I noted these areas do not blanch and I suspect they may be potential damaged minor blood vessels.  In the interim I ordered an  MRI of his leg which came back and was personally reviewed by me and does not show any evidence of osteomyelitis or pyomyositis.  He also again had a history of his leg giving out from under him and I ordered a repeat MRI of his lumbar spine.  I have also personally reviewed them and agree that there is no evidence of an epidural abscess and there is still L4-L5 spinal canal stenosis which is no change as well as some severe neural neural foraminal stenosis on the right L3-4 and L4-5 L5-S1  He return ed to clinic again roughly a week later and he had not had any worsening of his erythema in his leg he has an area that he  believes is new where he had struck his leg against an object.   I saw him again in  August 2022  When I last saw him in November 2022 and he relayed a story of  having been on the couch and had an itch in his left shin and scratched it with his right foot with a sock.  This caused skin to come off and this area became erythematous.  He then initiated amoxicillin.  See pictures below:     Erythema resolved during 7 days of amoxicillin therapy.  He did develop a rash at the origins of therapy and believes he now may be allergic to amoxicillin.  He did not have chills fevers nausea malaise or other systemic symptoms.  Switched over to cefadroxil as a drug to use for potential cellulitis.  Since I last time he said he developed pancreatic deficiency with diarrhea and 45 pounds of weight loss.  Since then his blood pressure medicines have been adjusted at least 1 pill according to him by his "kidney doctor" he did tell me that he has had an episode of blood pressure in the 80s over 50s which he checked at home when he was feeling dizzy and lightheaded while sitting.  He shows me that 2 cuts that he recently sustained that he treated with antibiotics there pictured below.      Left leg  in November of 2022:     He continues to he sustained cuts in the legs to which she then applies mupirocin but this happens less frequently if he keeps the left leg protected he does have continued worsening edema on the left leg and worsening of his visa venous stasis [place] changes there relative to the right but no evidence of active cellulitis  He is worried he may have kidney stones again as he has back pain that is reemerged  Past Medical History:  Diagnosis Date   Allergy    Arthritis    Calculus of gallbladder without mention of cholecystitis    Cataract    bilateral cateracts removed   Celiac disease    Chronic kidney disease    stage 3   Coronary atherosclerosis of unspecified type of  vessel, native or graft    Dermatitis 06/08/2021   Erythema of lower extremity 09/08/2021   Esophageal reflux    Glaucoma    History of cellulitis 09/08/2021   History of kidney stones    Loss of weight    Lower extremity weakness 05/31/2021   Macular degeneration    Rt eye . injections   Old myocardial infarction    Osteoporosis, unspecified    Other malaise and fatigue    Other specified cardiac dysrhythmias(427.89)    Other testicular hypofunction    Pancreatic  insufficiency 05/31/2021   Personal history of colonic polyps    Postural dizziness with presyncope 12/13/2021   Pure hypercholesterolemia    Thrombocytopenia (HCC)    chronic, per PCP notes dating back to 2009   Tinea pedis 05/31/2021   Unspecified asthma(493.90)    Unspecified essential hypertension    Unspecified hypothyroidism    Venous stasis dermatitis 09/08/2021    Past Surgical History:  Procedure Laterality Date   ARTERY BIOPSY Left 02/18/2022   Procedure: LEFT TEMPORAL ARTERY BIOPSY;  Surgeon: Kieth Brightly Arta Bruce, MD;  Location: [facility];  Service: General;  Laterality: Left;   cataract surgery Bilateral    COLONOSCOPY  06/26/2017   [facility]   COLONOSCOPY WITH PROPOFOL N/A 11/27/2014   Procedure: COLONOSCOPY WITH PROPOFOL;  Surgeon: Milus Banister, MD;  Location: [place];  Service: Endoscopy;  Laterality: N/A;   CORONARY ANGIOPLASTY     CORONARY STENT PLACEMENT  2011   Cypher; in distal circumflex artery   CYSTOSCOPY  1998   IR URETERAL STENT LEFT NEW ACCESS W/O SEP NEPHROSTOMY CATH  06/21/2021   KNEE SURGERY     right   LEFT HEART CATH AND CORONARY ANGIOGRAPHY N/A 01/31/2020   Procedure: LEFT HEART CATH AND CORONARY ANGIOGRAPHY;  Surgeon: Belva Crome, MD;  Location: [facility];  Service: Cardiovascular;  Laterality: N/A;   NEPHROLITHOTOMY Left 06/21/2021   Procedure: NEPHROLITHOTOMY PERCUTANEOUS;  Surgeon: Franchot Gallo, MD;  Location: [facility];  Service: Urology;  Laterality: Left;   77 MINS   SHOULDER SURGERY Left 07/2013   Dr Shara Blazing   UPPER GASTROINTESTINAL ENDOSCOPY      Family History  Problem Relation Age of Onset   Lymphoma Father    Hypertension Other    Vision loss Maternal Grandmother    Colon cancer Son 55   Asthma Neg Hx    Esophageal cancer Neg Hx    Rectal cancer Neg Hx    Stomach cancer Neg Hx    Colon polyps Neg Hx       Social History   Socioeconomic History   Marital status: Married    Spouse name: Koree Schopf   Number of children: 3   Years of education: Not on file   Highest education level: Not on file  Occupational History   Occupation: retired    Fish farm manager: RETIRED    Comment: Engineering  Tobacco Use   Smoking status: Never   Smokeless tobacco: Never  Vaping Use   Vaping Use: Never used  Substance and Sexual Activity   Alcohol use: No   Drug use: No   Sexual activity: Yes  Other Topics Concern   Not on file  Social History Narrative   Retired.   Regular Exercise-Yes; yoga & silver sneakers   Daily Caffeine Use.            Social Determinants of Health   Financial Resource Strain: Low Risk  (04/21/2021)   Overall Financial Resource Strain (CARDIA)    Difficulty of Paying Living Expenses: Not hard at all  Food Insecurity: No Food Insecurity (04/21/2021)   Hunger Vital Sign    Worried About Running Out of Food in the Last Year: Never true    Ran Out of Food in the Last Year: Never true  Transportation Needs: No Transportation Needs (04/21/2021)   PRAPARE - Hydrologist (Medical): No    Lack of Transportation (Non-Medical): No  Physical Activity: Sufficiently Active (04/21/2021)   Exercise Vital Sign  Days of Exercise per Week: 5 days    Minutes of Exercise per Session: 30 min  Stress: No Stress Concern Present (04/21/2021)   [place]    Feeling of Stress : Not at all  Social Connections: [place]  (04/21/2021)   Social Connection and Isolation Panel [NHANES]    Frequency of Communication with Friends and Family: More than three times a week    Frequency of Social Gatherings with Friends and Family: More than three times a week    Attends Religious Services: More than 4 times per year    Active Member of [employer] or Organizations: Yes    Attends Music therapist: More than 4 times per year    Marital Status: Married    Allergies  Allergen Reactions   Biaxin [Clarithromycin]     Dizziness   Tizanidine     Felt funny   Allantoin-Pramoxine Other (See Comments)    Pt does't remember reaction   Amlodipine Swelling    Edema    Amoxicillin Rash    Rash on arms that developed towards end of 7 day treatment   Benzalkonium Chloride Itching and Rash   Doxycycline Photosensitivity and Rash   Metoprolol Tartrate Other (See Comments)    REACTION: fatigue     Current Outpatient Medications:    acetaminophen (TYLENOL) 500 MG tablet, Take 1 tablet (500 mg total) by mouth every 6 (six) hours as needed for up to 30 doses., Disp: 30 tablet, Rfl: 0   calcitRIOL (ROCALTROL) 0.25 MCG capsule, TAKE 1 CAPSULE BY MOUTH EVERY DAY, Disp: 90 capsule, Rfl: 1   carvedilol (COREG) 25 MG tablet, Take 12.5 mg by mouth 2 (two) times daily with a meal., Disp: , Rfl:    cetirizine (ZYRTEC) 10 MG tablet, Take 10 mg by mouth daily as needed for allergies., Disp: , Rfl:    Cholecalciferol 2000 UNITS TABS, Take 2,000 Units by mouth daily., Disp: , Rfl:    clonazePAM (KLONOPIN) 0.25 MG disintegrating tablet, TAKE 1 TABLET (0.25 MG TOTAL) BY MOUTH 2 (TWO) TIMES DAILY AS NEEDED (ANXIETY)., Disp: 60 tablet, Rfl: 3   diclofenac sodium (VOLTAREN) 1 % GEL, APPLY 4 G TOPICALLY 4 TIMES DAILY. (Patient taking differently: Apply 4 g topically 4 (four) times daily as needed (pain).), Disp: 200 g, Rfl: 5   hydrALAZINE (APRESOLINE) 10 MG tablet, Take 1-2 tablet 3 times a day as needed if blood pressure is >170, Disp:  60 tablet, Rfl: 3   isosorbide mononitrate (IMDUR) 30 MG 24 hr tablet, Take 1 tablet (30 mg total) by mouth daily., Disp: 90 tablet, Rfl: 2   levothyroxine (SYNTHROID) 175 MCG tablet, TAKE 1 TABLET BY MOUTH EVERY DAY, Disp: 90 tablet, Rfl: 3   lipase/protease/amylase (CREON) 36000 UNITS CPEP capsule, Take 2 capsules (72,000 Units total) by mouth 3 (three) times daily with meals. May also take 1 capsule (36,000 Units total) as needed (with snacks)., Disp: 240 capsule, Rfl: 11   Multiple Vitamins-Minerals (PRESERVISION AREDS 2) CAPS, Take 1 capsule by mouth in the morning and at bedtime., Disp: , Rfl:    mupirocin ointment (BACTROBAN) 2 %, Apply 1 application topically 2 (two) times daily. Apply to wounds, cuts to prevent infection (Patient taking differently: Apply 1 application  topically daily as needed (cellulitis flare).), Disp: 22 g, Rfl: 0   nitroGLYCERIN (NITROSTAT) 0.4 MG SL tablet, Place 0.4 mg under the tongue every 5 (five) minutes as needed for chest pain.,  Disp: , Rfl:    olmesartan (BENICAR) 5 MG tablet, TAKE 1 TABLET BY MOUTH EVERY DAY, Disp: 90 tablet, Rfl: 0   pantoprazole (PROTONIX) 40 MG tablet, Take 1 tablet (40 mg total) by mouth 2 (two) times daily., Disp: 60 tablet, Rfl: 5   pneumococcal 20-valent conjugate vaccine (PREVNAR 20) 0.5 ML injection, Inject 0.5 mLs into the muscle once., Disp: , Rfl:    predniSONE (DELTASONE) 10 MG tablet, Take 10 mg by mouth daily with breakfast., Disp: , Rfl:    PREVNAR 20 0.5 ML injection, , Disp: , Rfl:    Propylene Glycol (SYSTANE BALANCE) 0.6 % SOLN, Place 1 drop into both eyes daily as needed (Dry eye)., Disp: , Rfl:    simvastatin (ZOCOR) 10 MG tablet, TAKE 1 TABLET BY MOUTH EVERYDAY AT BEDTIME, Disp: 90 tablet, Rfl: 3   SYRINGE-NEEDLE, DISP, 3 ML (BD ECLIPSE SYRINGE) 23G X 1" 3 ML MISC, As directed IM, Disp: 50 each, Rfl: 2   tamsulosin (FLOMAX) 0.4 MG CAPS capsule, Take 0.4 mg by mouth daily., Disp: , Rfl:    testosterone cypionate  (DEPOTESTOSTERONE CYPIONATE) 200 MG/ML injection, INJECT 2ML INTO MUSCLE EVERY 2 WEEKS, Disp: 12 mL, Rfl: 1   UNABLE TO FIND, Take 2 tablets by mouth daily. Med Name: [facility], Disp: , Rfl:    Review of Systems  Constitutional:  Negative for activity change, appetite change, chills, diaphoresis, fatigue, fever and unexpected weight change.  HENT:  Negative for congestion, rhinorrhea, sinus pressure, sneezing, sore throat and trouble swallowing.   Eyes:  Negative for photophobia and visual disturbance.  Respiratory:  Negative for cough, chest tightness, shortness of breath, wheezing and stridor.   Cardiovascular:  Negative for chest pain, palpitations and leg swelling.  Gastrointestinal:  Negative for abdominal distention, abdominal pain, anal bleeding, blood in stool, constipation, diarrhea, nausea and vomiting.  Genitourinary:  Negative for difficulty urinating, dysuria, flank pain and hematuria.  Musculoskeletal:  Negative for arthralgias, back pain, gait problem, joint swelling and myalgias.  Skin:  Negative for color change, pallor, rash and wound.  Neurological:  Negative for dizziness, tremors, weakness and light-headedness.  Hematological:  Negative for adenopathy. Does not bruise/bleed easily.  Psychiatric/Behavioral:  Negative for agitation, behavioral problems, confusion, decreased concentration, dysphoric mood and sleep disturbance.        Objective:   Physical Exam Constitutional:      Appearance: He is well-developed.  HENT:     Head: Normocephalic and atraumatic.  Eyes:     Conjunctiva/sclera: Conjunctivae normal.  Cardiovascular:     Rate and Rhythm: Normal rate and regular rhythm.  Pulmonary:     Effort: Pulmonary effort is normal. No respiratory distress.     Breath sounds: No wheezing.  Abdominal:     General: There is no distension.     Palpations: Abdomen is soft.  Musculoskeletal:        General: No tenderness. Normal range of motion.     Cervical back:  Normal range of motion and neck supple.  Skin:    General: Skin is warm and dry.     Coloration: Skin is not pale.     Findings: No erythema or rash.  Neurological:     General: No focal deficit present.     Mental Status: He is alert and oriented to person, place, and time.  Psychiatric:        Mood and Affect: Mood normal.        Behavior: Behavior normal.  Thought Content: Thought content normal.        Judgment: Judgment normal.       Venous stasis dermatitis 06/13/2022:           Assessment & Plan:   History of recurrent episodes of cellulitis: None recently.  He largely has persistent venous stasis dermatitis.  I think he is doing good job of vigilantly watching for cuts and attending them with topical antimicrobials.  Back pain could indeed have kidney stones is going to be seeing Dr. Diona Fanti

## 2022-06-14 ENCOUNTER — Encounter: Payer: Self-pay | Admitting: Internal Medicine

## 2022-06-14 ENCOUNTER — Ambulatory Visit (INDEPENDENT_AMBULATORY_CARE_PROVIDER_SITE_OTHER): Payer: PPO | Admitting: Internal Medicine

## 2022-06-14 DIAGNOSIS — M79651 Pain in right thigh: Secondary | ICD-10-CM | POA: Diagnosis not present

## 2022-06-14 DIAGNOSIS — N1832 Chronic kidney disease, stage 3b: Secondary | ICD-10-CM

## 2022-06-14 DIAGNOSIS — M353 Polymyalgia rheumatica: Secondary | ICD-10-CM

## 2022-06-14 DIAGNOSIS — M1731 Unilateral post-traumatic osteoarthritis, right knee: Secondary | ICD-10-CM

## 2022-06-14 NOTE — Assessment & Plan Note (Signed)
Much better Prednisone  Potential benefits of a long term steroid  use as well as potential risks  and complications were explained to the patient and were aknowledged.

## 2022-06-14 NOTE — Assessment & Plan Note (Signed)
LLE sprain, s/p fall ACE wrap Arnica cream for bruises

## 2022-06-14 NOTE — Patient Instructions (Signed)
Arnica cream for bruises

## 2022-06-14 NOTE — Assessment & Plan Note (Signed)
ACE wrap x2

## 2022-06-14 NOTE — Assessment & Plan Note (Signed)
Monitor GFR

## 2022-06-14 NOTE — Progress Notes (Signed)
Subjective:  Patient ID: Scott Oneal, male    DOB: [birth year]  Age: 81 y.o. MRN: [id number]  CC: Follow-up and Fall (3-4 days ago had fall in house going down steps of house. Left leg gave out on him and he hurt his back, hip and leg.)   HPI ZHI GEIER presents for PMR and weakness - better  Follow-up and Fall (3-4 days ago had fall in house going down steps of house. Left leg gave out on him and he hurt his back, hip and leg.) L leg folded under on the step: c/o L knee and L buttock/sitting bone.  Outpatient Medications Prior to Visit  Medication Sig Dispense Refill   acetaminophen (TYLENOL) 500 MG tablet Take 1 tablet (500 mg total) by mouth every 6 (six) hours as needed for up to 30 doses. 30 tablet 0   calcitRIOL (ROCALTROL) 0.25 MCG capsule TAKE 1 CAPSULE BY MOUTH EVERY DAY 90 capsule 1   carvedilol (COREG) 25 MG tablet Take 12.5 mg by mouth 2 (two) times daily with a meal.     cetirizine (ZYRTEC) 10 MG tablet Take 10 mg by mouth daily as needed for allergies.     Cholecalciferol 2000 UNITS TABS Take 2,000 Units by mouth daily.     clonazePAM (KLONOPIN) 0.25 MG disintegrating tablet TAKE 1 TABLET (0.25 MG TOTAL) BY MOUTH 2 (TWO) TIMES DAILY AS NEEDED (ANXIETY). 60 tablet 3   diclofenac sodium (VOLTAREN) 1 % GEL APPLY 4 G TOPICALLY 4 TIMES DAILY. (Patient taking differently: Apply 4 g topically 4 (four) times daily as needed (pain).) 200 g 5   hydrALAZINE (APRESOLINE) 10 MG tablet Take 1-2 tablet 3 times a day as needed if blood pressure is >170 60 tablet 3   isosorbide mononitrate (IMDUR) 30 MG 24 hr tablet Take 1 tablet (30 mg total) by mouth daily. 90 tablet 2   levothyroxine (SYNTHROID) 175 MCG tablet TAKE 1 TABLET BY MOUTH EVERY DAY 90 tablet 3   lipase/protease/amylase (CREON) 36000 UNITS CPEP capsule Take 2 capsules (72,000 Units total) by mouth 3 (three) times daily with meals. May also take 1 capsule (36,000 Units total) as needed (with snacks). 240 capsule 11   Multiple  Vitamins-Minerals (PRESERVISION AREDS 2) CAPS Take 1 capsule by mouth in the morning and at bedtime.     mupirocin ointment (BACTROBAN) 2 % Apply 1 application topically 2 (two) times daily. Apply to wounds, cuts to prevent infection (Patient taking differently: Apply 1 application  topically daily as needed (cellulitis flare).) 22 g 0   nitroGLYCERIN (NITROSTAT) 0.4 MG SL tablet Place 0.4 mg under the tongue every 5 (five) minutes as needed for chest pain.     olmesartan (BENICAR) 5 MG tablet TAKE 1 TABLET BY MOUTH EVERY DAY 90 tablet 0   pantoprazole (PROTONIX) 40 MG tablet Take 1 tablet (40 mg total) by mouth 2 (two) times daily. 60 tablet 5   predniSONE (DELTASONE) 10 MG tablet Take 10 mg by mouth daily with breakfast.     Propylene Glycol (SYSTANE BALANCE) 0.6 % SOLN Place 1 drop into both eyes daily as needed (Dry eye).     simvastatin (ZOCOR) 10 MG tablet TAKE 1 TABLET BY MOUTH EVERYDAY AT BEDTIME 90 tablet 3   SYRINGE-NEEDLE, DISP, 3 ML (BD ECLIPSE SYRINGE) 23G X 1" 3 ML MISC As directed IM 50 each 2   tamsulosin (FLOMAX) 0.4 MG CAPS capsule Take 0.4 mg by mouth daily.     testosterone cypionate (DEPOTESTOSTERONE  CYPIONATE) 200 MG/ML injection INJECT 2ML INTO MUSCLE EVERY 2 WEEKS 12 mL 1   UNABLE TO FIND Take 2 tablets by mouth daily. Med Name: [name]     pneumococcal 20-valent conjugate vaccine (PREVNAR 20) 0.5 ML injection Inject 0.5 mLs into the muscle once.     PREVNAR 20 0.5 ML injection      No facility-administered medications prior to visit.    ROS: Review of Systems  Constitutional:  Positive for fatigue. Negative for appetite change and unexpected weight change.  HENT:  Negative for congestion, nosebleeds, sneezing, sore throat and trouble swallowing.   Eyes:  Negative for itching and visual disturbance.  Respiratory:  Negative for cough.   Cardiovascular:  Negative for chest pain, palpitations and leg swelling.  Gastrointestinal:  Negative for abdominal distention,  blood in stool, diarrhea and nausea.  Genitourinary:  Negative for frequency and hematuria.  Musculoskeletal:  Positive for arthralgias, back pain and gait problem. Negative for joint swelling and neck pain.  Skin:  Negative for rash.  Neurological:  Negative for dizziness, tremors, speech difficulty and weakness.  Psychiatric/Behavioral:  Negative for agitation, dysphoric mood, sleep disturbance and suicidal ideas. The patient is not nervous/anxious.     Objective:  BP 136/70 (BP Location: Left Arm, Patient Position: Sitting, Cuff Size: Large)   Pulse 85   Temp 97.8 F (36.6 C) (Temporal)   Ht 6' (1.829 m)   Wt 180 lb (81.6 kg)   SpO2 99%   BMI 24.41 kg/m   BP Readings from Last 3 Encounters:  06/14/22 136/70  06/13/22 107/71  06/08/22 124/66    Wt Readings from Last 3 Encounters:  06/14/22 180 lb (81.6 kg)  06/13/22 181 lb (82.1 kg)  06/08/22 179 lb (81.2 kg)    Physical Exam Constitutional:      General: He is not in acute distress.    Appearance: He is well-developed.     Comments: NAD  Eyes:     Conjunctiva/sclera: Conjunctivae normal.     Pupils: Pupils are equal, round, and reactive to light.  Neck:     Thyroid: No thyromegaly.     Vascular: No JVD.  Cardiovascular:     Rate and Rhythm: Normal rate and regular rhythm.     Heart sounds: Normal heart sounds. No murmur heard.    No friction rub. No gallop.  Pulmonary:     Effort: Pulmonary effort is normal. No respiratory distress.     Breath sounds: Normal breath sounds. No wheezing or rales.  Chest:     Chest wall: No tenderness.  Abdominal:     General: Bowel sounds are normal. There is no distension.     Palpations: Abdomen is soft. There is no mass.     Tenderness: There is no abdominal tenderness. There is no guarding or rebound.  Musculoskeletal:        General: Tenderness present. Normal range of motion.     Cervical back: Normal range of motion.  Lymphadenopathy:     Cervical: No cervical  adenopathy.  Skin:    General: Skin is warm and dry.     Findings: No rash.  Neurological:     Mental Status: He is alert and oriented to person, place, and time.     Cranial Nerves: No cranial nerve deficit.     Motor: No abnormal muscle tone.     Coordination: Coordination normal.     Gait: Gait normal.     Deep Tendon Reflexes: Reflexes are  normal and symmetric.  Psychiatric:        Behavior: Behavior normal.        Thought Content: Thought content normal.        Judgment: Judgment normal.   LLE w/1+ swelling, discoloration L ischial area, L knee w/pain  ACE applied x2  Lab Results  Component Value Date   WBC 6.6 02/10/2022   HGB 15.3 02/10/2022   HCT 47.7 02/10/2022   PLT 57 (L) 02/10/2022   GLUCOSE 111 (H) 02/10/2022   CHOL 115 02/18/2020   TRIG 115 02/18/2020   HDL 34 (L) 02/18/2020   LDLCALC 60 02/18/2020   ALT 17 12/28/2021   AST 15 12/28/2021   NA 141 02/10/2022   K 4.3 02/10/2022   CL 106 02/10/2022   CREATININE 1.63 (H) 02/10/2022   BUN 46 (H) 02/10/2022   CO2 31 02/10/2022   TSH 11.04 (H) 04/15/2021   PSA 2.08 08/24/2020   INR 1.1 06/21/2021   HGBA1C 6.0 12/28/2021   MICROALBUR 1.2 05/18/2017    No results found.  Assessment & Plan:   Problem List Items Addressed This Visit     CKD (chronic kidney disease) stage 3, GFR 30-59 ml/min (HCC)    Monitor GFR      Osteoarthritis of knee    ACE wrap x2      PMR (polymyalgia rheumatica) (HCC)    Much better Prednisone  Potential benefits of a long term steroid  use as well as potential risks  and complications were explained to the patient and were aknowledged.        Thigh pain    LLE sprain, s/p fall ACE wrap Arnica cream for bruises         No orders of the defined types were placed in this encounter.     Follow-up: No follow-ups on file.  Walker Kehr, MD

## 2022-06-15 ENCOUNTER — Other Ambulatory Visit: Payer: PPO

## 2022-06-15 DIAGNOSIS — E782 Mixed hyperlipidemia: Secondary | ICD-10-CM | POA: Diagnosis not present

## 2022-06-15 LAB — HEPATIC FUNCTION PANEL
ALT: 29 IU/L (ref 0–44)
AST: 21 IU/L (ref 0–40)
Albumin: 4 g/dL (ref 3.8–4.8)
Alkaline Phosphatase: 68 IU/L (ref 44–121)
Bilirubin Total: 0.5 mg/dL (ref 0.0–1.2)
Bilirubin, Direct: 0.19 mg/dL (ref 0.00–0.40)
Total Protein: 5.9 g/dL — ABNORMAL LOW (ref 6.0–8.5)

## 2022-06-15 LAB — LIPID PANEL
Chol/HDL Ratio: 2.4 ratio (ref 0.0–5.0)
Cholesterol, Total: 136 mg/dL (ref 100–199)
HDL: 56 mg/dL (ref >39)
LDL Chol Calc (NIH): 65 mg/dL (ref 0–99)
Triglycerides: 78 mg/dL (ref 0–149)
VLDL Cholesterol Cal: 15 mg/dL (ref 5–40)

## 2022-06-16 ENCOUNTER — Ambulatory Visit: Payer: PPO

## 2022-06-23 ENCOUNTER — Emergency Department (HOSPITAL_COMMUNITY): Payer: PPO

## 2022-06-23 ENCOUNTER — Encounter (HOSPITAL_COMMUNITY): Payer: Self-pay | Admitting: Family Medicine

## 2022-06-23 ENCOUNTER — Ambulatory Visit (INDEPENDENT_AMBULATORY_CARE_PROVIDER_SITE_OTHER): Payer: PPO | Admitting: Family Medicine

## 2022-06-23 ENCOUNTER — Inpatient Hospital Stay (HOSPITAL_COMMUNITY)
Admission: EM | Admit: 2022-06-23 | Discharge: 2022-06-25 | DRG: 271 | Disposition: A | Discharge status: Home / Self Care | Payer: PPO | Attending: Internal Medicine | Admitting: Internal Medicine

## 2022-06-23 ENCOUNTER — Other Ambulatory Visit: Payer: Self-pay

## 2022-06-23 ENCOUNTER — Encounter: Payer: Self-pay | Admitting: Family Medicine

## 2022-06-23 ENCOUNTER — Encounter: Payer: Self-pay | Admitting: Internal Medicine

## 2022-06-23 VITALS — BP 140/80 | HR 77 | Temp 97.6°F | Ht 72.0 in | Wt 186.0 lb

## 2022-06-23 DIAGNOSIS — Z821 Family history of blindness and visual loss: Secondary | ICD-10-CM | POA: Diagnosis not present

## 2022-06-23 DIAGNOSIS — I130 Hypertensive heart and chronic kidney disease with heart failure and stage 1 through stage 4 chronic kidney disease, or unspecified chronic kidney disease: Secondary | ICD-10-CM | POA: Diagnosis not present

## 2022-06-23 DIAGNOSIS — Z8249 Family history of ischemic heart disease and other diseases of the circulatory system: Secondary | ICD-10-CM

## 2022-06-23 DIAGNOSIS — I5042 Chronic combined systolic (congestive) and diastolic (congestive) heart failure: Secondary | ICD-10-CM | POA: Diagnosis present

## 2022-06-23 DIAGNOSIS — D696 Thrombocytopenia, unspecified: Secondary | ICD-10-CM | POA: Diagnosis present

## 2022-06-23 DIAGNOSIS — Z955 Presence of coronary angioplasty implant and graft: Secondary | ICD-10-CM

## 2022-06-23 DIAGNOSIS — Z881 Allergy status to other antibiotic agents status: Secondary | ICD-10-CM

## 2022-06-23 DIAGNOSIS — Z888 Allergy status to other drugs, medicaments and biological substances status: Secondary | ICD-10-CM

## 2022-06-23 DIAGNOSIS — I10 Essential (primary) hypertension: Secondary | ICD-10-CM | POA: Diagnosis not present

## 2022-06-23 DIAGNOSIS — I2510 Atherosclerotic heart disease of native coronary artery without angina pectoris: Secondary | ICD-10-CM

## 2022-06-23 DIAGNOSIS — E861 Hypovolemia: Secondary | ICD-10-CM | POA: Diagnosis not present

## 2022-06-23 DIAGNOSIS — I872 Venous insufficiency (chronic) (peripheral): Secondary | ICD-10-CM | POA: Diagnosis not present

## 2022-06-23 DIAGNOSIS — Z807 Family history of other malignant neoplasms of lymphoid, hematopoietic and related tissues: Secondary | ICD-10-CM | POA: Diagnosis not present

## 2022-06-23 DIAGNOSIS — N184 Chronic kidney disease, stage 4 (severe): Secondary | ICD-10-CM | POA: Diagnosis not present

## 2022-06-23 DIAGNOSIS — Z7952 Long term (current) use of systemic steroids: Secondary | ICD-10-CM

## 2022-06-23 DIAGNOSIS — I745 Embolism and thrombosis of iliac artery: Principal | ICD-10-CM

## 2022-06-23 DIAGNOSIS — Z800 Family history of malignant neoplasm of digestive organs: Secondary | ICD-10-CM

## 2022-06-23 DIAGNOSIS — E039 Hypothyroidism, unspecified: Secondary | ICD-10-CM | POA: Diagnosis not present

## 2022-06-23 DIAGNOSIS — M1990 Unspecified osteoarthritis, unspecified site: Secondary | ICD-10-CM

## 2022-06-23 DIAGNOSIS — Z86718 Personal history of other venous thrombosis and embolism: Secondary | ICD-10-CM

## 2022-06-23 DIAGNOSIS — M7989 Other specified soft tissue disorders: Secondary | ICD-10-CM

## 2022-06-23 DIAGNOSIS — I82422 Acute embolism and thrombosis of left iliac vein: Principal | ICD-10-CM

## 2022-06-23 DIAGNOSIS — Z880 Allergy status to penicillin: Secondary | ICD-10-CM

## 2022-06-23 DIAGNOSIS — S0990XA Unspecified injury of head, initial encounter: Secondary | ICD-10-CM | POA: Diagnosis not present

## 2022-06-23 DIAGNOSIS — R209 Unspecified disturbances of skin sensation: Secondary | ICD-10-CM

## 2022-06-23 DIAGNOSIS — N179 Acute kidney failure, unspecified: Secondary | ICD-10-CM | POA: Diagnosis not present

## 2022-06-23 DIAGNOSIS — E034 Atrophy of thyroid (acquired): Secondary | ICD-10-CM | POA: Diagnosis not present

## 2022-06-23 DIAGNOSIS — K900 Celiac disease: Secondary | ICD-10-CM | POA: Diagnosis not present

## 2022-06-23 DIAGNOSIS — E875 Hyperkalemia: Secondary | ICD-10-CM | POA: Diagnosis not present

## 2022-06-23 DIAGNOSIS — M79605 Pain in left leg: Secondary | ICD-10-CM

## 2022-06-23 DIAGNOSIS — I252 Old myocardial infarction: Secondary | ICD-10-CM

## 2022-06-23 DIAGNOSIS — Z86010 Personal history of colonic polyps: Secondary | ICD-10-CM

## 2022-06-23 DIAGNOSIS — Z9861 Coronary angioplasty status: Secondary | ICD-10-CM | POA: Diagnosis not present

## 2022-06-23 DIAGNOSIS — I82409 Acute embolism and thrombosis of unspecified deep veins of unspecified lower extremity: Secondary | ICD-10-CM | POA: Diagnosis present

## 2022-06-23 DIAGNOSIS — I878 Other specified disorders of veins: Secondary | ICD-10-CM | POA: Diagnosis not present

## 2022-06-23 DIAGNOSIS — Z79899 Other long term (current) drug therapy: Secondary | ICD-10-CM

## 2022-06-23 DIAGNOSIS — E291 Testicular hypofunction: Secondary | ICD-10-CM | POA: Diagnosis present

## 2022-06-23 DIAGNOSIS — Z9181 History of falling: Secondary | ICD-10-CM

## 2022-06-23 DIAGNOSIS — E785 Hyperlipidemia, unspecified: Secondary | ICD-10-CM | POA: Diagnosis present

## 2022-06-23 DIAGNOSIS — N189 Chronic kidney disease, unspecified: Secondary | ICD-10-CM

## 2022-06-23 DIAGNOSIS — I82442 Acute embolism and thrombosis of left tibial vein: Secondary | ICD-10-CM | POA: Diagnosis not present

## 2022-06-23 DIAGNOSIS — S9002XA Contusion of left ankle, initial encounter: Secondary | ICD-10-CM | POA: Diagnosis not present

## 2022-06-23 DIAGNOSIS — R0989 Other specified symptoms and signs involving the circulatory and respiratory systems: Secondary | ICD-10-CM

## 2022-06-23 DIAGNOSIS — Z79890 Hormone replacement therapy (postmenopausal): Secondary | ICD-10-CM

## 2022-06-23 LAB — BASIC METABOLIC PANEL
Anion gap: 5 (ref 5–15)
BUN: 73 mg/dL — ABNORMAL HIGH (ref 8–23)
CO2: 24 mmol/L (ref 22–32)
Calcium: 9.2 mg/dL (ref 8.9–10.3)
Chloride: 109 mmol/L (ref 98–111)
Creatinine, Ser: 2.32 mg/dL — ABNORMAL HIGH (ref 0.61–1.24)
GFR, Estimated: 28 mL/min — ABNORMAL LOW (ref >60)
Glucose, Bld: 159 mg/dL — ABNORMAL HIGH (ref 70–99)
Potassium: 5.7 mmol/L — ABNORMAL HIGH (ref 3.5–5.1)
Sodium: 138 mmol/L (ref 135–145)

## 2022-06-23 LAB — CBC WITH DIFFERENTIAL/PLATELET
Abs Immature Granulocytes: 0.12 10*3/uL — ABNORMAL HIGH (ref 0.00–0.07)
Basophils Absolute: 0 10*3/uL (ref 0.0–0.1)
Basophils Relative: 0 %
Eosinophils Absolute: 0 10*3/uL (ref 0.0–0.5)
Eosinophils Relative: 0 %
HCT: 44.3 % (ref 39.0–52.0)
Hemoglobin: 14 g/dL (ref 13.0–17.0)
Immature Granulocytes: 1 %
Lymphocytes Relative: 8 %
Lymphs Abs: 0.7 10*3/uL (ref 0.7–4.0)
MCH: 29.3 pg (ref 26.0–34.0)
MCHC: 31.6 g/dL (ref 30.0–36.0)
MCV: 92.7 fL (ref 80.0–100.0)
Monocytes Absolute: 0.4 10*3/uL (ref 0.1–1.0)
Monocytes Relative: 4 %
Neutro Abs: 8.6 10*3/uL — ABNORMAL HIGH (ref 1.7–7.7)
Neutrophils Relative %: 87 %
Platelets: 67 10*3/uL — ABNORMAL LOW (ref 150–400)
RBC: 4.78 MIL/uL (ref 4.22–5.81)
RDW: 16.2 % — ABNORMAL HIGH (ref 11.5–15.5)
WBC: 9.8 10*3/uL (ref 4.0–10.5)
nRBC: 0 % (ref 0.0–0.2)

## 2022-06-23 LAB — LACTIC ACID, PLASMA: Lactic Acid, Venous: 2.1 mmol/L (ref 0.5–1.9)

## 2022-06-23 LAB — POTASSIUM: Potassium: 5.4 mmol/L — ABNORMAL HIGH (ref 3.5–5.1)

## 2022-06-23 LAB — PROTIME-INR
INR: 1.3 — ABNORMAL HIGH (ref 0.8–1.2)
Prothrombin Time: 15.7 seconds — ABNORMAL HIGH (ref 11.4–15.2)

## 2022-06-23 MED ORDER — HEPARIN BOLUS VIA INFUSION
5000.0000 [IU] | Freq: Once | INTRAVENOUS | Status: AC
  Administered 2022-06-23: 5000 [IU] via INTRAVENOUS
  Filled 2022-06-23: qty 5000

## 2022-06-23 MED ORDER — VITAMIN D 25 MCG (1000 UNIT) PO TABS
2000.0000 [IU] | ORAL_TABLET | Freq: Every day | ORAL | Status: DC
  Administered 2022-06-24 – 2022-06-25 (×2): 2000 [IU] via ORAL
  Filled 2022-06-23 (×2): qty 2

## 2022-06-23 MED ORDER — PANCRELIPASE (LIP-PROT-AMYL) 12000-38000 UNITS PO CPEP
72000.0000 [IU] | ORAL_CAPSULE | Freq: Three times a day (TID) | ORAL | Status: DC
Start: 2022-06-24 — End: 2022-06-25
  Administered 2022-06-24 – 2022-06-25 (×3): 72000 [IU] via ORAL
  Filled 2022-06-23: qty 6
  Filled 2022-06-23: qty 2
  Filled 2022-06-23: qty 6
  Filled 2022-06-23: qty 2
  Filled 2022-06-23: qty 6
  Filled 2022-06-23: qty 2

## 2022-06-23 MED ORDER — CLONAZEPAM 0.25 MG PO TBDP: 0.2500 mg | ORAL_TABLET | Freq: Two times a day (BID) | ORAL | Status: DC | PRN

## 2022-06-23 MED ORDER — LACTATED RINGERS IV SOLN
INTRAVENOUS | Status: AC
Start: 2022-06-23 — End: 2022-06-24

## 2022-06-23 MED ORDER — LEVOTHYROXINE SODIUM 75 MCG PO TABS
175.0000 ug | ORAL_TABLET | Freq: Every day | ORAL | Status: DC
Start: 2022-06-24 — End: 2022-06-25
  Administered 2022-06-25: 175 ug via ORAL
  Filled 2022-06-23 (×2): qty 1

## 2022-06-23 MED ORDER — CARVEDILOL 12.5 MG PO TABS
12.5000 mg | ORAL_TABLET | Freq: Two times a day (BID) | ORAL | Status: DC
  Administered 2022-06-24 – 2022-06-25 (×3): 12.5 mg via ORAL
  Filled 2022-06-23 (×3): qty 1

## 2022-06-23 MED ORDER — ACETAMINOPHEN 500 MG PO TABS: 500.0000 mg | ORAL_TABLET | Freq: Four times a day (QID) | ORAL | Status: DC | PRN

## 2022-06-23 MED ORDER — PREDNISONE 10 MG PO TABS
10.0000 mg | ORAL_TABLET | Freq: Every day | ORAL | Status: DC
  Administered 2022-06-24 – 2022-06-25 (×2): 10 mg via ORAL
  Filled 2022-06-23 (×2): qty 1

## 2022-06-23 MED ORDER — SODIUM ZIRCONIUM CYCLOSILICATE 5 G PO PACK
5.0000 g | PACK | Freq: Once | ORAL | Status: AC
  Administered 2022-06-23: 5 g via ORAL
  Filled 2022-06-23: qty 1

## 2022-06-23 MED ORDER — CALCITRIOL 0.25 MCG PO CAPS
0.2500 ug | ORAL_CAPSULE | Freq: Every day | ORAL | Status: DC
Start: 2022-06-24 — End: 2022-06-25
  Administered 2022-06-24 – 2022-06-25 (×2): 0.25 ug via ORAL
  Filled 2022-06-23 (×2): qty 1

## 2022-06-23 MED ORDER — LACTATED RINGERS IV BOLUS
1000.0000 mL | Freq: Once | INTRAVENOUS | Status: AC
Start: 2022-06-23 — End: 2022-06-23
  Administered 2022-06-23: 1000 mL via INTRAVENOUS

## 2022-06-23 MED ORDER — HEPARIN (PORCINE) 25000 UT/250ML-% IV SOLN
1100.0000 [IU]/h | INTRAVENOUS | Status: DC
  Administered 2022-06-23: 1350 [IU]/h via INTRAVENOUS
  Filled 2022-06-23: qty 250

## 2022-06-23 MED ORDER — TAMSULOSIN HCL 0.4 MG PO CAPS
0.4000 mg | ORAL_CAPSULE | Freq: Every day | ORAL | Status: DC
  Administered 2022-06-25: 0.4 mg via ORAL
  Filled 2022-06-23 (×2): qty 1

## 2022-06-23 MED ORDER — PANTOPRAZOLE SODIUM 40 MG PO TBEC
40.0000 mg | DELAYED_RELEASE_TABLET | Freq: Every day | ORAL | Status: DC
  Administered 2022-06-24 – 2022-06-25 (×2): 40 mg via ORAL
  Filled 2022-06-23 (×2): qty 1

## 2022-06-23 MED ORDER — ISOSORBIDE MONONITRATE ER 30 MG PO TB24
30.0000 mg | ORAL_TABLET | Freq: Every day | ORAL | Status: DC
  Administered 2022-06-23 – 2022-06-25 (×3): 30 mg via ORAL
  Filled 2022-06-23 (×3): qty 1

## 2022-06-23 MED ORDER — SIMVASTATIN 20 MG PO TABS
10.0000 mg | ORAL_TABLET | Freq: Every day | ORAL | Status: DC
  Administered 2022-06-24: 10 mg via ORAL
  Filled 2022-06-23: qty 1

## 2022-06-23 NOTE — ED Triage Notes (Signed)
Pt with left leg swelling/warmth, mottled in color, moving up to groin from foot.

## 2022-06-23 NOTE — ED Provider Triage Note (Signed)
Emergency Medicine Provider Triage Evaluation Note  Scott Oneal , a 81 y.o. male  was evaluated in triage.  Pt complains of leg swelling. Report pain and swelling to LLE x 1 week.  No cp or sob. No fever or chills.  Did tripped and fell over a week ago and his left ankle went underneath his thigh.  Hx of cellulitis to LLE in the past.  Seen by PCP today and sent here for further assessment  Review of Systems  Positive: As above Negative: As above  Physical Exam  BP (!) 152/77 (BP Location: Right Arm)   Pulse 90   Temp 98.6 F (37 C) (Oral)   Resp 18   SpO2 99%  Gen:   Awake, no distress   Resp:  Normal effort  MSK:   Moves extremities without difficulty  Other:    Medical Decision Making  Medically screening exam initiated at 4:31 PM.  Appropriate orders placed.  RASHOD GOUGEON was informed that the remainder of the evaluation will be completed by another provider, this initial triage assessment does not replace that evaluation, and the importance of remaining in the ED until their evaluation is complete.     Domenic Moras, PA-C 06/23/22 [phone]

## 2022-06-23 NOTE — Assessment & Plan Note (Signed)
Plt of 67. Has documented to be chronic since 2009. Unclear of workup with PCP but has never been evaluated by hematology.  -Will follow closely with midnight and morning labs while on heparin infusion.

## 2022-06-23 NOTE — Progress Notes (Signed)
Subjective:     Patient ID: Scott Oneal, male    DOB: May 15, 1941, 81 y.o.   MRN: [id number]  Chief Complaint  Patient presents with   Leg Swelling    Left leg swelling from foot all the way to groin, was swollen at last visit w Dr. Camila Li and since then it has doubled in size    HPI Patient is in today for severe swelling, discoloration and pain of left leg and foot. States swelling worsened since yesterday and the swelling is now up to his left groin.    Denies fever, chills, dizziness, chest pain, palpitations, shortness of breath.   There are no preventive care reminders to display for this patient.  Past Medical History:  Diagnosis Date   Allergy    Arthritis    Calculus of gallbladder without mention of cholecystitis    Cataract    bilateral cateracts removed   Celiac disease    Chronic kidney disease    stage 3   Coronary atherosclerosis of unspecified type of vessel, native or graft    Dermatitis 06/08/2021   Erythema of lower extremity 09/08/2021   Esophageal reflux    Glaucoma    History of cellulitis 09/08/2021   History of kidney stones    Loss of weight    Lower extremity weakness 05/31/2021   Macular degeneration    Rt eye . injections   Old myocardial infarction    Osteoporosis, unspecified    Other malaise and fatigue    Other specified cardiac dysrhythmias(427.89)    Other testicular hypofunction    Pancreatic insufficiency 05/31/2021   Personal history of colonic polyps    Postural dizziness with presyncope 12/13/2021   Pure hypercholesterolemia    Thrombocytopenia ([place])    chronic, per PCP notes dating back to 2009   Tinea pedis 05/31/2021   Unspecified asthma(493.90)    Unspecified essential hypertension    Unspecified hypothyroidism    Venous stasis dermatitis 09/08/2021    Past Surgical History:  Procedure Laterality Date   ARTERY BIOPSY Left 02/18/2022   Procedure: LEFT TEMPORAL ARTERY BIOPSY;  Surgeon: Kieth Brightly Arta Bruce, MD;   Location: [facility];  Service: General;  Laterality: Left;   cataract surgery Bilateral    COLONOSCOPY  06/26/2017   [facility]   COLONOSCOPY WITH PROPOFOL N/A 11/27/2014   Procedure: COLONOSCOPY WITH PROPOFOL;  Surgeon: Milus Banister, MD;  Location: [facility];  Service: Endoscopy;  Laterality: N/A;   CORONARY ANGIOPLASTY     CORONARY STENT PLACEMENT  2011   Cypher; in distal circumflex artery   CYSTOSCOPY  1998   IR URETERAL STENT LEFT NEW ACCESS W/O SEP NEPHROSTOMY CATH  06/21/2021   KNEE SURGERY     right   LEFT HEART CATH AND CORONARY ANGIOGRAPHY N/A 01/31/2020   Procedure: LEFT HEART CATH AND CORONARY ANGIOGRAPHY;  Surgeon: Belva Crome, MD;  Location: [facility];  Service: Cardiovascular;  Laterality: N/A;   NEPHROLITHOTOMY Left 06/21/2021   Procedure: NEPHROLITHOTOMY PERCUTANEOUS;  Surgeon: Franchot Gallo, MD;  Location: [facility];  Service: Urology;  Laterality: Left;  73 MINS   SHOULDER SURGERY Left 07/2013   Dr Shara Blazing   UPPER GASTROINTESTINAL ENDOSCOPY      Family History  Problem Relation Age of Onset   Lymphoma Father    Hypertension Other    Vision loss Maternal Grandmother    Colon cancer Son 91   Asthma Neg Hx    Esophageal cancer Neg Hx  Rectal cancer Neg Hx    Stomach cancer Neg Hx    Colon polyps Neg Hx     Social History   Socioeconomic History   Marital status: Married    Spouse name: Lydia Meng   Number of children: 3   Years of education: Not on file   Highest education level: Not on file  Occupational History   Occupation: retired    Fish farm manager: RETIRED    Comment: Engineering  Tobacco Use   Smoking status: Never   Smokeless tobacco: Never  Vaping Use   Vaping Use: Never used  Substance and Sexual Activity   Alcohol use: No   Drug use: No   Sexual activity: Yes  Other Topics Concern   Not on file  Social History Narrative   Retired.   Regular Exercise-Yes; yoga & silver sneakers   Daily Caffeine Use.            Social  Determinants of Health   Financial Resource Strain: Low Risk  (04/21/2021)   Overall Financial Resource Strain (CARDIA)    Difficulty of Paying Living Expenses: Not hard at all  Food Insecurity: No Food Insecurity (04/21/2021)   Hunger Vital Sign    Worried About Running Out of Food in the Last Year: Never true    Ran Out of Food in the Last Year: Never true  Transportation Needs: No Transportation Needs (04/21/2021)   PRAPARE - Hydrologist (Medical): No    Lack of Transportation (Non-Medical): No  Physical Activity: Sufficiently Active (04/21/2021)   Exercise Vital Sign    Days of Exercise per Week: 5 days    Minutes of Exercise per Session: 30 min  Stress: No Stress Concern Present (04/21/2021)   [place]    Feeling of Stress : Not at all  Social Connections: [place] (04/21/2021)   Social Connection and Isolation Panel [NHANES]    Frequency of Communication with Friends and Family: More than three times a week    Frequency of Social Gatherings with Friends and Family: More than three times a week    Attends Religious Services: More than 4 times per year    Active Member of [employer] or Organizations: Yes    Attends Music therapist: More than 4 times per year    Marital Status: Married  Human resources officer Violence: Not At Risk (04/21/2021)   Humiliation, Afraid, Rape, and Kick questionnaire    Fear of Current or Ex-Partner: No    Emotionally Abused: No    Physically Abused: No    Sexually Abused: No    Outpatient Medications Prior to Visit  Medication Sig Dispense Refill   acetaminophen (TYLENOL) 500 MG tablet Take 1 tablet (500 mg total) by mouth every 6 (six) hours as needed for up to 30 doses. 30 tablet 0   calcitRIOL (ROCALTROL) 0.25 MCG capsule TAKE 1 CAPSULE BY MOUTH EVERY DAY 90 capsule 1   carvedilol (COREG) 25 MG tablet Take 12.5 mg by mouth 2 (two) times  daily with a meal.     cetirizine (ZYRTEC) 10 MG tablet Take 10 mg by mouth daily as needed for allergies.     Cholecalciferol 2000 UNITS TABS Take 2,000 Units by mouth daily.     clonazePAM (KLONOPIN) 0.25 MG disintegrating tablet TAKE 1 TABLET (0.25 MG TOTAL) BY MOUTH 2 (TWO) TIMES DAILY AS NEEDED (ANXIETY). 60 tablet 3   diclofenac sodium (VOLTAREN) 1 %  GEL APPLY 4 G TOPICALLY 4 TIMES DAILY. (Patient taking differently: Apply 4 g topically 4 (four) times daily as needed (pain).) 200 g 5   hydrALAZINE (APRESOLINE) 10 MG tablet Take 1-2 tablet 3 times a day as needed if blood pressure is >170 60 tablet 3   isosorbide mononitrate (IMDUR) 30 MG 24 hr tablet Take 1 tablet (30 mg total) by mouth daily. 90 tablet 2   levothyroxine (SYNTHROID) 175 MCG tablet TAKE 1 TABLET BY MOUTH EVERY DAY 90 tablet 3   lipase/protease/amylase (CREON) 36000 UNITS CPEP capsule Take 2 capsules (72,000 Units total) by mouth 3 (three) times daily with meals. May also take 1 capsule (36,000 Units total) as needed (with snacks). 240 capsule 11   Multiple Vitamins-Minerals (PRESERVISION AREDS 2) CAPS Take 1 capsule by mouth in the morning and at bedtime.     mupirocin ointment (BACTROBAN) 2 % Apply 1 application topically 2 (two) times daily. Apply to wounds, cuts to prevent infection (Patient taking differently: Apply 1 application  topically daily as needed (cellulitis flare).) 22 g 0   nitroGLYCERIN (NITROSTAT) 0.4 MG SL tablet Place 0.4 mg under the tongue every 5 (five) minutes as needed for chest pain.     olmesartan (BENICAR) 5 MG tablet TAKE 1 TABLET BY MOUTH EVERY DAY 90 tablet 0   pantoprazole (PROTONIX) 40 MG tablet Take 1 tablet (40 mg total) by mouth 2 (two) times daily. 60 tablet 5   predniSONE (DELTASONE) 10 MG tablet Take 10 mg by mouth daily with breakfast.     Propylene Glycol (SYSTANE BALANCE) 0.6 % SOLN Place 1 drop into both eyes daily as needed (Dry eye).     simvastatin (ZOCOR) 10 MG tablet TAKE 1 TABLET  BY MOUTH EVERYDAY AT BEDTIME 90 tablet 3   SYRINGE-NEEDLE, DISP, 3 ML (BD ECLIPSE SYRINGE) 23G X 1" 3 ML MISC As directed IM 50 each 2   tamsulosin (FLOMAX) 0.4 MG CAPS capsule Take 0.4 mg by mouth daily.     testosterone cypionate (DEPOTESTOSTERONE CYPIONATE) 200 MG/ML injection INJECT 2ML INTO MUSCLE EVERY 2 WEEKS 12 mL 1   UNABLE TO FIND Take 2 tablets by mouth daily. Med Name: [name]     No facility-administered medications prior to visit.    Allergies  Allergen Reactions   Biaxin [Clarithromycin]     Dizziness   Tizanidine     Felt funny   Allantoin-Pramoxine Other (See Comments)    Pt does't remember reaction   Amlodipine Swelling    Edema    Amoxicillin Rash    Rash on arms that developed towards end of 7 day treatment   Benzalkonium Chloride Itching and Rash   Doxycycline Photosensitivity and Rash   Metoprolol Tartrate Other (See Comments)    REACTION: fatigue    ROS     Objective:    Physical Exam  BP (!) 140/80 (BP Location: Right Arm, Patient Position: Sitting, Cuff Size: Large)   Pulse 77   Temp 97.6 F (36.4 C) (Temporal)   Ht 6' (1.829 m)   Wt 186 lb (84.4 kg)   SpO2 97%   BMI 25.23 kg/m  Wt Readings from Last 3 Encounters:  06/23/22 186 lb (84.4 kg)  06/14/22 180 lb (81.6 kg)  06/13/22 181 lb (82.1 kg)   Alert and in no distress.  Cardiac exam shows a regular rate and rhythm . Lungs are clear to auscultation. Left leg with significant edema and purplish discoloration, unable to palpate pulses in left  foot and his left foot is cold.      Assessment & Plan:   Problem List Items Addressed This Visit       Other   Absent foot pulse   Cold left foot   Pain and swelling of left lower extremity - Primary   Exam is concerning for ischemic limb.  Unable to palpate pulses in his left foot or popliteal and his foot is cold.  Strict precautions to go straight to the emergency department for immediate evaluation.  I am having Arlice Colt [facility]"  maintain his Cholecalciferol, nitroGLYCERIN, diclofenac sodium, Systane Balance, cetirizine, PreserVision AREDS 2, hydrALAZINE, tamsulosin, UNABLE TO FIND, BD Eclipse Syringe, clonazePAM, simvastatin, isosorbide mononitrate, mupirocin ointment, pantoprazole, acetaminophen, calcitRIOL, olmesartan, levothyroxine, testosterone cypionate, lipase/protease/amylase, predniSONE, and carvedilol.  No orders of the defined types were placed in this encounter.

## 2022-06-23 NOTE — Assessment & Plan Note (Signed)
Patient has been receiving testosterone every 2 weeks for symptoms of fatigue.  Discussed increased risk of thrombosis with hormonal replacement therapy. -Advised to follow-up with PCP regarding benefits and risk in the setting of new DVT

## 2022-06-23 NOTE — Assessment & Plan Note (Signed)
K of 5.7 in the setting of AKI -Give Lokelma x1 and follow with morning lab

## 2022-06-23 NOTE — Progress Notes (Signed)
LLE venous duplex has been completed.  Critical results given to Dr. Pearline Cables.   Results can be found under chart review under CV PROC. 06/23/2022 5:36 PM Kandace Elrod RVT, RDMS

## 2022-06-23 NOTE — Assessment & Plan Note (Signed)
Has been on prednisone 39m for the past 3 weeks for arthritic pain. Will continue for now to avoid adrenal insufficiency but recommended addressing with PCP regarding taper

## 2022-06-23 NOTE — Patient Instructions (Signed)
Please have your son take you to the [facility] emergency department right away for a possible ischemic limb

## 2022-06-23 NOTE — ED Provider Notes (Signed)
[facility] EMERGENCY DEPARTMENT Provider Note   CSN: [id number] Arrival date & time: 06/23/22  1618     History  Chief Complaint  Patient presents with   Leg Swelling    Scott Oneal is a 81 y.o. male.  Patient as above with significant medical history as below, including CKD stage III, CAD, HLD, thrombocytopenia, venous stasis dermatitis who presents to the ED with complaint of lower extremity abnormality and swelling.  Patient had a fall few days ago, reports his leg crumpled underneath him.  No LOC, no head injury.  He has been ambulatory since the event does have some mild left-sided ankle pain.  Noticed significant swelling, tingling and pain to his left lower extremity over the past 48 hours discoloration to his lower extremity.  History of prior DVT or PE.  Not on blood thinners does take aspirin 81 mg daily.  He has no abdominal pain, nausea or vomiting, no chest pain or dyspnea.  No recent travel or sick contacts.  No fevers or chills     Past Medical History:  Diagnosis Date   Allergy    Arthritis    Calculus of gallbladder without mention of cholecystitis    Cataract    bilateral cateracts removed   Celiac disease    Chronic kidney disease    stage 3   Coronary atherosclerosis of unspecified type of vessel, native or graft    Dermatitis 06/08/2021   Erythema of lower extremity 09/08/2021   Esophageal reflux    Glaucoma    History of cellulitis 09/08/2021   History of kidney stones    Loss of weight    Lower extremity weakness 05/31/2021   Macular degeneration    Rt eye . injections   Old myocardial infarction    Osteoporosis, unspecified    Other malaise and fatigue    Other specified cardiac dysrhythmias(427.89)    Other testicular hypofunction    Pancreatic insufficiency 05/31/2021   Personal history of colonic polyps    Postural dizziness with presyncope 12/13/2021   Pure hypercholesterolemia    Thrombocytopenia ([place])    chronic,  per PCP notes dating back to 2009   Tinea pedis 05/31/2021   Unspecified asthma(493.90)    Unspecified essential hypertension    Unspecified hypothyroidism    Venous stasis dermatitis 09/08/2021    Past Surgical History:  Procedure Laterality Date   ARTERY BIOPSY Left 02/18/2022   Procedure: LEFT TEMPORAL ARTERY BIOPSY;  Surgeon: Kieth Brightly Arta Bruce, MD;  Location: [facility];  Service: General;  Laterality: Left;   cataract surgery Bilateral    COLONOSCOPY  06/26/2017   [facility]   COLONOSCOPY WITH PROPOFOL N/A 11/27/2014   Procedure: COLONOSCOPY WITH PROPOFOL;  Surgeon: Milus Banister, MD;  Location: [facility];  Service: Endoscopy;  Laterality: N/A;   CORONARY ANGIOPLASTY     CORONARY STENT PLACEMENT  2011   Cypher; in distal circumflex artery   CYSTOSCOPY  1998   IR URETERAL STENT LEFT NEW ACCESS W/O SEP NEPHROSTOMY CATH  06/21/2021   KNEE SURGERY     right   LEFT HEART CATH AND CORONARY ANGIOGRAPHY N/A 01/31/2020   Procedure: LEFT HEART CATH AND CORONARY ANGIOGRAPHY;  Surgeon: Belva Crome, MD;  Location: [facility];  Service: Cardiovascular;  Laterality: N/A;   NEPHROLITHOTOMY Left 06/21/2021   Procedure: NEPHROLITHOTOMY PERCUTANEOUS;  Surgeon: Franchot Gallo, MD;  Location: [facility];  Service: Urology;  Laterality: Left;  90 MINS   SHOULDER SURGERY Left  07/2013   Dr Shara Blazing   UPPER GASTROINTESTINAL ENDOSCOPY       The history is provided by the patient. No language interpreter was used.       Home Medications Prior to Admission medications   Medication Sig Start Date End Date Taking? Authorizing Provider  acetaminophen (TYLENOL) 500 MG tablet Take 1 tablet (500 mg total) by mouth every 6 (six) hours as needed for up to 30 doses. 02/18/22  Yes Kinsinger, Arta Bruce, MD  calcitRIOL (ROCALTROL) 0.25 MCG capsule TAKE 1 CAPSULE BY MOUTH EVERY DAY 03/02/22  Yes Plotnikov, Evie Lacks, MD  carvedilol (COREG) 25 MG tablet Take 12.5 mg by mouth 2 (two) times daily with a  meal.   Yes [provider]  cetirizine (ZYRTEC) 10 MG tablet Take 10 mg by mouth daily as needed for allergies.   Yes [provider]  Cholecalciferol 2000 UNITS TABS Take 2,000 Units by mouth daily.   Yes [provider]  clonazePAM (KLONOPIN) 0.25 MG disintegrating tablet TAKE 1 TABLET (0.25 MG TOTAL) BY MOUTH 2 (TWO) TIMES DAILY AS NEEDED (ANXIETY). 04/19/21  Yes Plotnikov, Evie Lacks, MD  diclofenac sodium (VOLTAREN) 1 % GEL APPLY 4 G TOPICALLY 4 TIMES DAILY. Patient taking differently: Apply 4 g topically 4 (four) times daily as needed (pain). 02/13/18  Yes Plotnikov, Evie Lacks, MD  hydrALAZINE (APRESOLINE) 10 MG tablet Take 1-2 tablet 3 times a day as needed if blood pressure is >170 04/07/20  Yes Plotnikov, Evie Lacks, MD  isosorbide mononitrate (IMDUR) 30 MG 24 hr tablet Take 1 tablet (30 mg total) by mouth daily. 10/28/21  Yes Plotnikov, Evie Lacks, MD  levothyroxine (SYNTHROID) 175 MCG tablet TAKE 1 TABLET BY MOUTH EVERY DAY 03/02/22  Yes Plotnikov, Evie Lacks, MD  lipase/protease/amylase (CREON) 36000 UNITS CPEP capsule Take 2 capsules (72,000 Units total) by mouth 3 (three) times daily with meals. May also take 1 capsule (36,000 Units total) as needed (with snacks). 04/25/22  Yes Plotnikov, Evie Lacks, MD  Multiple Vitamins-Minerals (PRESERVISION AREDS 2) CAPS Take 1 capsule by mouth in the morning and at bedtime.   Yes [provider]  mupirocin ointment (BACTROBAN) 2 % Apply 1 application topically 2 (two) times daily. Apply to wounds, cuts to prevent infection Patient taking differently: Apply 1 application  topically daily as needed (cellulitis flare). 12/13/21  Yes Tommy Medal, Lavell Islam, MD  olmesartan [facility]) 5 MG tablet TAKE 1 TABLET BY MOUTH EVERY DAY 03/02/22  Yes Sherren Mocha, MD  pantoprazole (PROTONIX) 40 MG tablet Take 1 tablet (40 mg total) by mouth 2 (two) times daily. Patient taking differently: Take 40 mg by mouth daily. 02/14/22 02/14/23 Yes  Plotnikov, Evie Lacks, MD  predniSONE (DELTASONE) 10 MG tablet Take 10 mg by mouth daily with breakfast.   Yes [provider]  Propylene Glycol (SYSTANE BALANCE) 0.6 % SOLN Place 1 drop into both eyes daily as needed (Dry eye).   Yes [provider]  simvastatin (ZOCOR) 10 MG tablet TAKE 1 TABLET BY MOUTH EVERYDAY AT BEDTIME 07/14/21  Yes Plotnikov, Evie Lacks, MD  tamsulosin (FLOMAX) 0.4 MG CAPS capsule Take 0.4 mg by mouth daily.   Yes [provider]  testosterone cypionate (DEPOTESTOSTERONE CYPIONATE) 200 MG/ML injection INJECT 2ML INTO MUSCLE EVERY 2 WEEKS 04/19/22  Yes Plotnikov, Evie Lacks, MD  nitroGLYCERIN (NITROSTAT) 0.4 MG SL tablet Place 0.4 mg under the tongue every 5 (five) minutes as needed for chest pain.    [provider]  SYRINGE-NEEDLE,  DISP, 3 ML (BD ECLIPSE SYRINGE) 23G X 1" 3 ML MISC As directed IM 11/24/20   Plotnikov, Evie Lacks, MD      Allergies    Biaxin [clarithromycin], Tizanidine, Allantoin-pramoxine, Amlodipine, Amoxicillin, Benzalkonium chloride, Doxycycline, and Metoprolol tartrate    Review of Systems   Review of Systems  Constitutional:  Negative for chills and fever.  HENT:  Negative for facial swelling and trouble swallowing.   Eyes:  Negative for photophobia and visual disturbance.  Respiratory:  Negative for cough and shortness of breath.   Cardiovascular:  Positive for leg swelling. Negative for chest pain and palpitations.  Gastrointestinal:  Negative for abdominal pain, nausea and vomiting.  Endocrine: Negative for polydipsia and polyuria.  Genitourinary:  Negative for difficulty urinating and hematuria.  Musculoskeletal:  Positive for arthralgias. Negative for gait problem and joint swelling.  Skin:  Positive for color change. Negative for pallor and rash.  Neurological:  Negative for syncope and headaches.  Psychiatric/Behavioral:  Negative for agitation and confusion.     Physical Exam Updated Vital Signs BP  127/83   Pulse 98   Temp 98.6 F (37 C) (Oral)   Resp 14   SpO2 98%  Physical Exam Vitals and nursing note reviewed.  Constitutional:      General: He is not in acute distress.    Appearance: Normal appearance. He is well-developed. He is not ill-appearing or diaphoretic.  HENT:     Head: Normocephalic and atraumatic. No raccoon eyes, Battle's sign, right periorbital erythema or left periorbital erythema.     Jaw: There is normal jaw occlusion.     Right Ear: External ear normal.     Left Ear: External ear normal.     Mouth/Throat:     Mouth: Mucous membranes are moist.  Eyes:     General: No scleral icterus.    Extraocular Movements: Extraocular movements intact.     Pupils: Pupils are equal, round, and reactive to light.  Cardiovascular:     Rate and Rhythm: Normal rate and regular rhythm.     Pulses: Normal pulses.     Heart sounds: Normal heart sounds.  Pulmonary:     Effort: Pulmonary effort is normal. No respiratory distress.     Breath sounds: Normal breath sounds.  Abdominal:     General: Abdomen is flat.     Palpations: Abdomen is soft.     Tenderness: There is no abdominal tenderness.  Musculoskeletal:        General: Tenderness present. Normal range of motion.     Cervical back: Full passive range of motion without pain and normal range of motion.     Right lower leg: No edema.     Left lower leg: Edema present.     Comments: Discoloration to left lower extremity ;  dopplerable DP pulses.  Bilateral feet are cool to the touch but symmetric.  Capillary refill is mildly prolonged left foot compared to right foot.  Compartments are soft left lower extremity.   Skin:    General: Skin is warm and dry.     Capillary Refill: Capillary refill takes less than 2 seconds.  Neurological:     Mental Status: He is alert and oriented to person, place, and time.     GCS: GCS eye subscore is 4. GCS verbal subscore is 5. GCS motor subscore is 6.     Cranial Nerves: Cranial  nerves 2-12 are intact. No dysarthria.     Sensory: Sensation is intact.  Motor: Motor function is intact. No tremor.     Coordination: Coordination is intact.  Psychiatric:        Mood and Affect: Mood normal.        Behavior: Behavior normal.     ED Results / Procedures / Treatments   Labs (all labs ordered are listed, but only abnormal results are displayed) Labs Reviewed  BASIC METABOLIC PANEL - Abnormal; Notable for the following components:      Result Value   Potassium 5.7 (*)    Glucose, Bld 159 (*)    BUN 73 (*)    Creatinine, Ser 2.32 (*)    GFR, Estimated 28 (*)    All other components within normal limits  CBC WITH DIFFERENTIAL/PLATELET - Abnormal; Notable for the following components:   RDW 16.2 (*)    Platelets 67 (*)    Neutro Abs 8.6 (*)    Abs Immature Granulocytes 0.12 (*)    All other components within normal limits  LACTIC ACID, PLASMA - Abnormal; Notable for the following components:   Lactic Acid, Venous 2.1 (*)    All other components within normal limits  POTASSIUM - Abnormal; Notable for the following components:   Potassium 5.4 (*)    All other components within normal limits  PROTIME-INR - Abnormal; Notable for the following components:   Prothrombin Time 15.7 (*)    INR 1.3 (*)    All other components within normal limits  LACTIC ACID, PLASMA    EKG None  Radiology CT Head Wo Contrast  Result Date: 06/23/2022 CLINICAL DATA:  Head trauma, minor (Age >= 65y) EXAM: CT HEAD WITHOUT CONTRAST TECHNIQUE: Contiguous axial images were obtained from the base of the skull through the vertex without intravenous contrast. RADIATION DOSE REDUCTION: This exam was performed according to the departmental dose-optimization program which includes automated exposure control, adjustment of the mA and/or kV according to patient size and/or use of iterative reconstruction technique. COMPARISON:  None Available. FINDINGS: Brain: No acute intracranial abnormality.  Specifically, no hemorrhage, hydrocephalus, mass lesion, acute infarction, or significant intracranial injury. Vascular: No hyperdense vessel or unexpected calcification. Skull: No acute calvarial abnormality. Sinuses/Orbits: No acute findings Other: None IMPRESSION: No acute intracranial abnormality. Electronically Signed   By: Rolm Baptise M.D.   On: 06/23/2022 19:04   VAS [place] LOWER EXTREMITY VENOUS (DVT) (7a-7p)  Result Date: 06/23/2022  Lower Venous DVT Study Patient Name:  Scott Oneal  Date of Exam:   06/23/2022 Medical Rec #: [id number]      Accession #:    [id number] Date of Birth: [birth year]     Patient Gender: M Patient Age:   33 years Exam Location:  [facility] Procedure:      VAS [place] LOWER EXTREMITY VENOUS (DVT) Referring Phys: Domenic Moras --------------------------------------------------------------------------------  Indications: Swelling.  Performing Technologist: Rogelia Rohrer RVT, RDMS  Examination Guidelines: A complete evaluation includes B-mode imaging, spectral Doppler, color Doppler, and power Doppler as needed of all accessible portions of each vessel. Bilateral testing is considered an integral part of a complete examination. Limited examinations for reoccurring indications may be performed as noted. The reflux portion of the exam is performed with the patient in reverse Trendelenburg.  +-----+---------------+---------+-----------+----------+--------------+ RIGHTCompressibilityPhasicitySpontaneityPropertiesThrombus Aging +-----+---------------+---------+-----------+----------+--------------+ CFV  Full           Yes      Yes                                 +-----+---------------+---------+-----------+----------+--------------+   +---------+---------------+---------+-----------+----------+------------------+  LEFT     CompressibilityPhasicitySpontaneityPropertiesThrombus Aging     +---------+---------------+---------+-----------+----------+------------------+ CFV       None           No       No                   Acute              +---------+---------------+---------+-----------+----------+------------------+ SFJ      None                                         Acute              +---------+---------------+---------+-----------+----------+------------------+ FV Prox  None           No       No                   Acute              +---------+---------------+---------+-----------+----------+------------------+ FV Mid   None           No       No                   Acute              +---------+---------------+---------+-----------+----------+------------------+ FV DistalNone           No       No                   Acute              +---------+---------------+---------+-----------+----------+------------------+ PFV      None           No       No                   Acute              +---------+---------------+---------+-----------+----------+------------------+ POP      None           No       No                   Acute              +---------+---------------+---------+-----------+----------+------------------+ PTV      None           No       No                   Acute              +---------+---------------+---------+-----------+----------+------------------+ PERO     None           No       No                   Acute              +---------+---------------+---------+-----------+----------+------------------+ Gastroc  None           No       No                   Acute              +---------+---------------+---------+-----------+----------+------------------+ GSV      Full           No  No                                      +---------+---------------+---------+-----------+----------+------------------+ EIV      None           No       No                   Acute - mid &                                                            distal              +---------+---------------+---------+-----------+----------+------------------+    Summary: RIGHT: - No evidence of common femoral vein obstruction.  LEFT: - Findings consistent with acute deep vein thrombosis involving the external illiac vein, left common femoral vein, SF junction, left femoral vein, left proximal profunda vein, left popliteal vein, left posterior tibial veins, left peroneal veins, and left gastrocnemius veins. - No cystic structure found in the popliteal fossa.  *See table(s) above for measurements and observations.    Preliminary    DG Ankle Complete Left  Result Date: 06/23/2022 CLINICAL DATA:  Fall with bruising and swelling to left ankle. EXAM: LEFT ANKLE COMPLETE - 3+ VIEW COMPARISON:  None Available. FINDINGS: There is no evidence of fracture, dislocation, or joint effusion. Intact talar dome. Moderate Achilles tendon enthesophyte. There is generalized soft tissue thickening and edema. Vascular calcifications are seen. IMPRESSION: 1. Soft tissue thickening and edema. No acute osseous abnormality. 2. Moderate Achilles tendon enthesophyte. Electronically Signed   By: Keith Rake M.D.   On: 06/23/2022 17:00    Procedures .Critical Care  Performed by: Jeanell Sparrow, DO Authorized by: Jeanell Sparrow, DO   Critical care provider statement:    Critical care time (minutes):  45   Critical care time was exclusive of:  Separately billable procedures and treating other patients   Critical care was necessary to treat or prevent imminent or life-threatening deterioration of the following conditions:  Circulatory failure   Critical care was time spent personally by me on the following activities:  Development of treatment plan with patient or surrogate, discussions with consultants, evaluation of patient's response to treatment, examination of patient, ordering and review of laboratory studies, ordering and review of radiographic studies, ordering and performing treatments and  interventions, pulse oximetry, re-evaluation of patient's condition, review of old charts and obtaining history from patient or surrogate   Care discussed with: admitting provider       Medications Ordered in ED Medications  lactated ringers bolus 1,000 mL (1,000 mLs Intravenous New Bag/Given 06/23/22 1749)    ED Course/ Medical Decision Making/ A&P Clinical Course as of 06/23/22 1921  Thu Jun 23, 2022  1747 LEFT: - Findings consistent with acute deep vein thrombosis involving the external illiac vein, left common femoral vein, SF junction, left femoral vein, left proximal profunda vein, left popliteal vein, left posterior tibial veins, left peroneal veins, and  left gastrocnemius veins. - No cystic structure found in the popliteal fossa.   [SG]  9024 Able to visualize L-DP pulses with bedside ultrasound/doppler  [SG]    Clinical Course User Index [SG] Wynona Dove  A, DO                           Medical Decision Making Amount and/or Complexity of Data Reviewed Labs: ordered. Radiology: ordered.  Risk Decision regarding hospitalization.    This patient presents to the ED with chief complaint(s) of leg swelling/color change with pertinent past medical history of CAD, HLD, HTN which further complicates the presenting complaint. The complaint involves an extensive differential diagnosis and also carries with it a high risk of complications and morbidity.    The differential diagnosis includes but not limited to DVT, PAD, stasis dermatitis, cellulitis, sprain, strain, fracture, soft tissue infection, arterial insufficiency other acute etiologies were considered as well   Serious etiologies were considered.   The initial plan is to screening labs, x-ray, duplex   Additional history obtained: Additional history obtained from family Records reviewed previous admission documents and Primary Care Documents  Independent labs interpretation:  The following labs were independently  interpreted:  Creatinine mildly worsened from his baseline, today is 2.32, typically ranging 1.6-2.0.  CKD stage III.  GFR is 28 today.  Thrombocytopenia 67, similar to his baseline.  No leukocytosis, hemoglobin 14.  potassium is 5.7 but hemolyzed, will repeat. >>unclear if he has been eval for his thrombocytopenia in the past   Independent visualization of imaging: - I independently visualized the following imaging with scope of interpretation limited to determining acute life threatening conditions related to emergency care: Left ankle x-ray, past ultrasound left lower extremity, head CT, which revealed thank x-ray with soft tissue swelling; vascular US with iliofemoral DVT, CT head non-acute  Cardiac monitoring was reviewed and interpreted by myself which shows NSR  Treatment and Reassessment: IVF Heparin   Consultation: - Consulted or discussed management/test interpretation w/ external professional: Given iliofemoral DVT will discuss with vascular surgery on-call, they will see in consult  Consideration for admission or further workup: Admission was considered    Plan to admit for iliofemoral DVT, vascular eval during this admission for ?thrombectomy. D/w hospitalist who accepts admission.   Social Determinants of health: Social History   Tobacco Use   Smoking status: Never   Smokeless tobacco: Never  Vaping Use   Vaping Use: Never used  Substance Use Topics   Alcohol use: No   Drug use: No           Final Clinical Impression(s) / ED Diagnoses Final diagnoses:  External iliac artery thrombosis [facility])    Rx / DC Orders ED Discharge Orders     None         Jeanell Sparrow, DO 06/25/22 0919

## 2022-06-23 NOTE — Progress Notes (Signed)
ANTICOAGULATION CONSULT NOTE - Initial Consult  Pharmacy Consult for Heparin Indication: DVT  Allergies  Allergen Reactions   Biaxin [Clarithromycin]     Dizziness   Tizanidine     Felt funny   Allantoin-Pramoxine Other (See Comments)    Pt does't remember reaction   Amlodipine Swelling    Edema    Amoxicillin Rash    Rash on arms that developed towards end of 7 day treatment   Benzalkonium Chloride Itching and Rash   Doxycycline Photosensitivity and Rash   Metoprolol Tartrate Other (See Comments)    REACTION: fatigue    Patient Measurements:   Heparin Dosing Weight: 84.4 kg  Vital Signs: Temp: 98.6 F (37 C) (08/24 1626) Temp Source: Oral (08/24 1626) BP: 127/83 (08/24 1845) Pulse Rate: 98 (08/24 1845)  Labs: Recent Labs    06/23/22 1638 06/23/22 1734  HGB 14.0  --   HCT 44.3  --   PLT 67*  --   LABPROT  --  15.7*  INR  --  1.3*  CREATININE 2.32*  --     Estimated Creatinine Clearance: 27.9 mL/min (A) (by C-G formula based on SCr of 2.32 mg/dL (H)).   Medical History: Past Medical History:  Diagnosis Date   Allergy    Arthritis    Calculus of gallbladder without mention of cholecystitis    Cataract    bilateral cateracts removed   Celiac disease    Chronic kidney disease    stage 3   Coronary atherosclerosis of unspecified type of vessel, native or graft    Dermatitis 06/08/2021   Erythema of lower extremity 09/08/2021   Esophageal reflux    Glaucoma    History of cellulitis 09/08/2021   History of kidney stones    Loss of weight    Lower extremity weakness 05/31/2021   Macular degeneration    Rt eye . injections   Old myocardial infarction    Osteoporosis, unspecified    Other malaise and fatigue    Other specified cardiac dysrhythmias(427.89)    Other testicular hypofunction    Pancreatic insufficiency 05/31/2021   Personal history of colonic polyps    Postural dizziness with presyncope 12/13/2021   Pure hypercholesterolemia     Thrombocytopenia ([place])    chronic, per PCP notes dating back to 2009   Tinea pedis 05/31/2021   Unspecified asthma(493.90)    Unspecified essential hypertension    Unspecified hypothyroidism    Venous stasis dermatitis 09/08/2021    Medications:  (Not in a hospital admission)  Scheduled:  Infusions:  PRN:   Assessment: 44 yom with a history of CAD s/p DES, HF, CKD, HTN, HLD, chronic venous stasis. Patient is presenting with leg swelling. Heparin per pharmacy consult placed for DVT.  [place] w/ acute DVT in LLE  Patient is not on anticoagulation prior to arrival.  Hgb 14; plt 67 PT/INR 15.7/1.3  Goal of Therapy:  Heparin level 0.3-0.7 units/ml Monitor platelets by anticoagulation protocol: Yes   Plan:  Discussion with admitting doctor regarding baseline platelet count --decision by MD to proceed with heparin for DVT with close plt monitoring  Give IV heparin 5000 units bolus x 1 Start heparin infusion at 1350 units/hr Check anti-Xa level in 8 hours and daily while on heparin Continue to monitor H&H and platelets  Lorelei Pont, PharmD, BCPS 06/23/2022 7:38 PM ED Clinical Pharmacist -  [phone]

## 2022-06-23 NOTE — Assessment & Plan Note (Signed)
Continue Imdur. Hold aspirin for now while on heparin infusion given low plt count

## 2022-06-23 NOTE — Telephone Encounter (Signed)
I was able to schd pt to see Vickie today at 3:40 pm.

## 2022-06-23 NOTE — H&P (Signed)
History and Physical    Patient: Scott DENIO PYP:[id number] DOB: 04/20/1941 DOA: 06/23/2022 DOS: the patient was seen and examined on 06/23/2022 PCP: Cassandria Anger, MD  Patient coming from: Home  Chief Complaint:  Chief Complaint  Patient presents with   Leg Swelling   HPI: Scott Oneal is a 81 y.o. male with medical history significant of CAD s/Oneal DES, combined systolic and diastolic heart failure, and CKD stage IV, hypertension hyperlipidemia, chronic venous stasis changes who presents with increase left lower extremity swelling.   Patient has also had chronic intermittent swelling of his left LE. About 2 weeks ago he felt down stairs and landed with his left leg bent behind him. Since then has increase LE edema, occasionally pain, decrease sensation to pre-tibial region. Became more concerned when swelling progressed to anterior thigh.  He presented to PCP today and was advised to present to the ED.  He denies any chest pain or shortness of breath.  No previous history of DVT or PE.  He has history of chronic thrombocytopenia dating back to at least 2009.  Has never seen a hematologist for additional work-up.  Only has superficial bruising but no history of major bleeds. He has been on testosterone therapy for several years receiving them every 2 weeks.  Also started on course of prednisone about 3 weeks ago for joint pain. Denies any tobacco, alcohol illicit drug use. 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  In the ED, he was afebrile normotensive.  No leukocytosis.  Lactate of 2.1.  Chronic thrombocytopenia with platelet of 67.  AKI with creatinine of 2.32 with a baseline of perhaps about 2. Hyperkalemia 5.7. No other electrolyte abnormality.  Left ankle x-ray shows soft tissue thickening and edema. Bilateral venous Doppler ultrasound showed extensive DVT of the left lower extremity involving the external iliac down to gastrocnemius vein.  ED  physician discussed case with vascular surgery who will see in consultation.  Hospitalist then consulted for admission.  Review of Systems: As mentioned in the history of present illness. All other systems reviewed and are negative. Past Medical History:  Diagnosis Date   Allergy    Arthritis    Calculus of gallbladder without mention of cholecystitis    Cataract    bilateral cateracts removed   Celiac disease    Chronic kidney disease    stage 3   Coronary atherosclerosis of unspecified type of vessel, native or graft    Dermatitis 06/08/2021   Erythema of lower extremity 09/08/2021   Esophageal reflux    Glaucoma    History of cellulitis 09/08/2021   History of kidney stones    Loss of weight    Lower extremity weakness 05/31/2021   Macular degeneration    Rt eye . injections   Old myocardial infarction    Osteoporosis, unspecified    Other malaise and fatigue    Other specified cardiac dysrhythmias(427.89)    Other testicular hypofunction    Pancreatic insufficiency 05/31/2021   Personal history of colonic polyps    Postural dizziness with presyncope 12/13/2021   Pure hypercholesterolemia    Thrombocytopenia ([place])    chronic, per PCP notes dating back to 2009   Tinea pedis 05/31/2021   Unspecified asthma(493.90)    Unspecified essential hypertension    Unspecified hypothyroidism    Venous stasis dermatitis 09/08/2021   Past Surgical History:  Procedure Laterality Date   ARTERY BIOPSY Left 02/18/2022   Procedure: LEFT  TEMPORAL ARTERY BIOPSY;  Surgeon: Kinsinger, Arta Bruce, MD;  Location: [facility];  Service: General;  Laterality: Left;   cataract surgery Bilateral    COLONOSCOPY  06/26/2017   [facility]   COLONOSCOPY WITH PROPOFOL N/A 11/27/2014   Procedure: COLONOSCOPY WITH PROPOFOL;  Surgeon: Milus Banister, MD;  Location: [facility];  Service: Endoscopy;  Laterality: N/A;   CORONARY ANGIOPLASTY     CORONARY STENT PLACEMENT  2011   Cypher; in distal circumflex  artery   CYSTOSCOPY  1998   IR URETERAL STENT LEFT NEW ACCESS W/O SEP NEPHROSTOMY CATH  06/21/2021   KNEE SURGERY     right   LEFT HEART CATH AND CORONARY ANGIOGRAPHY N/A 01/31/2020   Procedure: LEFT HEART CATH AND CORONARY ANGIOGRAPHY;  Surgeon: Belva Crome, MD;  Location: [facility];  Service: Cardiovascular;  Laterality: N/A;   NEPHROLITHOTOMY Left 06/21/2021   Procedure: NEPHROLITHOTOMY PERCUTANEOUS;  Surgeon: Franchot Gallo, MD;  Location: [facility];  Service: Urology;  Laterality: Left;  90 MINS   SHOULDER SURGERY Left 07/2013   Dr Shara Blazing   UPPER GASTROINTESTINAL ENDOSCOPY     Social History:  reports that he has never smoked. He has never used smokeless tobacco. He reports that he does not drink alcohol and does not use drugs.  Allergies  Allergen Reactions   Biaxin [Clarithromycin]     Dizziness   Tizanidine     Felt funny   Allantoin-Pramoxine Other (See Comments)    Pt does't remember reaction   Amlodipine Swelling    Edema    Amoxicillin Rash    Rash on arms that developed towards end of 7 day treatment   Benzalkonium Chloride Itching and Rash   Doxycycline Photosensitivity and Rash   Metoprolol Tartrate Other (See Comments)    REACTION: fatigue    Family History  Problem Relation Age of Onset   Lymphoma Father    Hypertension Other    Vision loss Maternal Grandmother    Colon cancer Son 20   Asthma Neg Hx    Esophageal cancer Neg Hx    Rectal cancer Neg Hx    Stomach cancer Neg Hx    Colon polyps Neg Hx     Prior to Admission medications   Medication Sig Start Date End Date Taking? Authorizing Provider  acetaminophen (TYLENOL) 500 MG tablet Take 1 tablet (500 mg total) by mouth every 6 (six) hours as needed for up to 30 doses. 02/18/22  Yes Kinsinger, Arta Bruce, MD  calcitRIOL (ROCALTROL) 0.25 MCG capsule TAKE 1 CAPSULE BY MOUTH EVERY DAY 03/02/22  Yes Plotnikov, Evie Lacks, MD  carvedilol (COREG) 25 MG tablet Take 12.5 mg by mouth 2 (two) times  daily with a meal.   Yes [provider]  cetirizine (ZYRTEC) 10 MG tablet Take 10 mg by mouth daily as needed for allergies.   Yes [provider]  Cholecalciferol 2000 UNITS TABS Take 2,000 Units by mouth daily.   Yes [provider]  clonazePAM (KLONOPIN) 0.25 MG disintegrating tablet TAKE 1 TABLET (0.25 MG TOTAL) BY MOUTH 2 (TWO) TIMES DAILY AS NEEDED (ANXIETY). 04/19/21  Yes Plotnikov, Evie Lacks, MD  diclofenac sodium (VOLTAREN) 1 % GEL APPLY 4 G TOPICALLY 4 TIMES DAILY. Patient taking differently: Apply 4 g topically 4 (four) times daily as needed (pain). 02/13/18  Yes Plotnikov, Evie Lacks, MD  hydrALAZINE (APRESOLINE) 10 MG tablet Take 1-2 tablet 3 times a day as needed if blood pressure is >170 04/07/20  Yes  Plotnikov, Evie Lacks, MD  isosorbide mononitrate (IMDUR) 30 MG 24 hr tablet Take 1 tablet (30 mg total) by mouth daily. 10/28/21  Yes Plotnikov, Evie Lacks, MD  levothyroxine (SYNTHROID) 175 MCG tablet TAKE 1 TABLET BY MOUTH EVERY DAY 03/02/22  Yes Plotnikov, Evie Lacks, MD  lipase/protease/amylase (CREON) 36000 UNITS CPEP capsule Take 2 capsules (72,000 Units total) by mouth 3 (three) times daily with meals. May also take 1 capsule (36,000 Units total) as needed (with snacks). 04/25/22  Yes Plotnikov, Evie Lacks, MD  Multiple Vitamins-Minerals (PRESERVISION AREDS 2) CAPS Take 1 capsule by mouth in the morning and at bedtime.   Yes [provider]  mupirocin ointment (BACTROBAN) 2 % Apply 1 application topically 2 (two) times daily. Apply to wounds, cuts to prevent infection Patient taking differently: Apply 1 application  topically daily as needed (cellulitis flare). 12/13/21  Yes Tommy Medal, Lavell Islam, MD  olmesartan [facility]) 5 MG tablet TAKE 1 TABLET BY MOUTH EVERY DAY 03/02/22  Yes Sherren Mocha, MD  pantoprazole (PROTONIX) 40 MG tablet Take 1 tablet (40 mg total) by mouth 2 (two) times daily. Patient taking differently: Take 40 mg by mouth daily. 02/14/22  02/14/23 Yes Plotnikov, Evie Lacks, MD  predniSONE (DELTASONE) 10 MG tablet Take 10 mg by mouth daily with breakfast.   Yes [provider]  Propylene Glycol (SYSTANE BALANCE) 0.6 % SOLN Place 1 drop into both eyes daily as needed (Dry eye).   Yes [provider]  simvastatin (ZOCOR) 10 MG tablet TAKE 1 TABLET BY MOUTH EVERYDAY AT BEDTIME 07/14/21  Yes Plotnikov, Evie Lacks, MD  tamsulosin (FLOMAX) 0.4 MG CAPS capsule Take 0.4 mg by mouth daily.   Yes [provider]  testosterone cypionate (DEPOTESTOSTERONE CYPIONATE) 200 MG/ML injection INJECT 2ML INTO MUSCLE EVERY 2 WEEKS 04/19/22  Yes Plotnikov, Evie Lacks, MD  nitroGLYCERIN (NITROSTAT) 0.4 MG SL tablet Place 0.4 mg under the tongue every 5 (five) minutes as needed for chest pain.    [provider]  SYRINGE-NEEDLE, DISP, 3 ML (BD ECLIPSE SYRINGE) 23G X 1" 3 ML MISC As directed IM 11/24/20   Plotnikov, Evie Lacks, MD    Physical Exam: Vitals:   06/23/22 1800 06/23/22 1815 06/23/22 1830 06/23/22 1845  BP: 120/69 132/82 (!) 143/88 127/83  Pulse: 68 97 98 98  Resp: 14 11 13 14   Temp:      TempSrc:      SpO2: 98% 97% 99% 98%   Constitutional: NAD, calm, comfortable, elderly gentleman sitting upright in bed Eyes: lids and conjunctivae normal ENMT: Mucous membranes are moist.  Neck: normal, supple Respiratory: clear to auscultation bilaterally, no wheezing, no crackles. Normal respiratory effort. No accessory muscle use.  Cardiovascular: Regular rate and rhythm, no murmurs / rubs / gallops.  +3 pitting edema of left lower extremity.  Difficulty obtaining pedal pulse due to significant edema.   Abdomen: no tenderness,  Bowel sounds positive.  Musculoskeletal: no clubbing / cyanosis. No joint deformity upper and lower extremities.  Normal muscle tone.  Skin: Erythema and edema of left lower extremity up to knee with decrease sensation around the pretibial region. Neurologic: CN 2-12 grossly intact.  Strength 5/5  in all 4.  Psychiatric: Normal judgment and insight. Alert and oriented x 3. Normal mood. Data Reviewed:  See HPI  Assessment and Plan: * DVT (deep venous thrombosis) (HCC) Provoked DVT following trauma from fall on left lower extremity and on chronic hormonal therapy. -Venous Doppler ultrasound showing extensive DVT involving the  external iliac vein, common femoral vein, SF junction, proximal profunda vein, popliteal vein, posterior tibial vein, peroneal vein and gastrocnemius vein. -Discussed with pharmacy regarding his thrombocytopenia.  Given his extensive DVT and platelets have been stable for many years, will continue with heparin bolus and maintain on infusion.  Will follow platelets at midnight and with morning labs. -Has been evaluated by vascular surgery Dr. Carlis Abbott who recommends thrombectomy in the morning.  Patient is undecided with intervention at this time.  Keep n.Oneal.o. for now in anticipation of potential intervention.  Acute on chronic kidney failure (HCC) AKI on CKD stage 4 -creatinine of 2.32 with a baseline of perhaps about 2. -keep on continuous IV LR and follow creatinine  -avoid nephrotoxic agent  -avoid contrasted study  Thrombocytopenia (HCC) Plt of 67. Has documented to be chronic since 2009. Unclear of workup with PCP but has never been evaluated by hematology.  -Will follow closely with midnight and morning labs while on heparin infusion.  Hyperkalemia K of 5.7 in the setting of AKI -Give Lokelma x1 and follow with morning lab  Hypogonadism in male Patient has been receiving testosterone every 2 weeks for symptoms of fatigue.  Discussed increased risk of thrombosis with hormonal replacement therapy. -Advised to follow-up with PCP regarding benefits and risk in the setting of new DVT  Hypothyroidism Continue home levothyroxine      Advance Care Planning:   Code Status: Full Code   Consults: vascular surgery  Family Communication: Discussed with son  at bedside  Severity of Illness: The appropriate patient status for this patient is INPATIENT. Inpatient status is judged to be reasonable and necessary in order to provide the required intensity of service to ensure the patient's safety. The patient's presenting symptoms, physical exam findings, and initial radiographic and laboratory data in the context of their chronic comorbidities is felt to place them at high risk for further clinical deterioration. Furthermore, it is not anticipated that the patient will be medically stable for discharge from the hospital within 2 midnights of admission.   * I certify that at the point of admission it is my clinical judgment that the patient will require inpatient hospital care spanning beyond 2 midnights from the point of admission due to high intensity of service, high risk for further deterioration and high frequency of surveillance required.*  Author: Orene Desanctis, DO 06/23/2022 8:26 PM  For on call review [email or web address].

## 2022-06-23 NOTE — Assessment & Plan Note (Signed)
Continue Coreg, hold olmesartan due to AKI

## 2022-06-23 NOTE — Assessment & Plan Note (Signed)
AKI on CKD stage 4 -creatinine of 2.32 with a baseline of perhaps about 2. -keep on continuous IV LR and follow creatinine  -avoid nephrotoxic agent  -avoid contrasted study

## 2022-06-23 NOTE — Assessment & Plan Note (Addendum)
Provoked DVT following trauma from fall on left lower extremity while on chronic hormonal therapy and recent steroid course. -Venous Doppler ultrasound showing extensive DVT involving the external iliac vein, common femoral vein, SF junction, proximal profunda vein, popliteal vein, posterior tibial vein, peroneal vein and gastrocnemius vein. -Discussed with pharmacy regarding his thrombocytopenia.  Given his extensive DVT and platelets have been stable for many years, will continue with heparin bolus and maintain on infusion.  Will follow platelets at midnight and with morning labs. -Has been evaluated by vascular surgery Dr. Carlis Abbott who recommends thrombectomy in the morning.  Patient is undecided with intervention at this time.  Keep n.p.o. for now in anticipation of potential intervention.

## 2022-06-23 NOTE — Assessment & Plan Note (Signed)
Continue home levothyroxine

## 2022-06-23 NOTE — Consult Note (Signed)
Hospital Consult    Reason for Consult:  Extensive left leg DVT Referring Physician:  ED MRN #:  [id number]  History of Present Illness: This is a 81 y.o. male with hx CAD s/p PCI, CHF with EF 45-50, HTN, HLD, stage 4 CKD that vascular surgery has been consulted for extensive left leg DVT.  Patient states about 2 weeks ago he had a fall on the stairs outside his home.  He noticed swelling shortly after that.  This has gotten worse over the last several days.  No history of previous DVT.  Not on blood thinners.  Venous duplex in the ED showed extensive acute DVT involving the left tibial, popliteal, femoral, common femoral external iliac vein.  Patient is very active and independent.  He works outside in his yard every day.  He does Silver sneakers.  Past Medical History:  Diagnosis Date   Allergy    Arthritis    Calculus of gallbladder without mention of cholecystitis    Cataract    bilateral cateracts removed   Celiac disease    Chronic kidney disease    stage 3   Coronary atherosclerosis of unspecified type of vessel, native or graft    Dermatitis 06/08/2021   Erythema of lower extremity 09/08/2021   Esophageal reflux    Glaucoma    History of cellulitis 09/08/2021   History of kidney stones    Loss of weight    Lower extremity weakness 05/31/2021   Macular degeneration    Rt eye . injections   Old myocardial infarction    Osteoporosis, unspecified    Other malaise and fatigue    Other specified cardiac dysrhythmias(427.89)    Other testicular hypofunction    Pancreatic insufficiency 05/31/2021   Personal history of colonic polyps    Postural dizziness with presyncope 12/13/2021   Pure hypercholesterolemia    Thrombocytopenia ([place])    chronic, per PCP notes dating back to 2009   Tinea pedis 05/31/2021   Unspecified asthma(493.90)    Unspecified essential hypertension    Unspecified hypothyroidism    Venous stasis dermatitis 09/08/2021    Past Surgical History:   Procedure Laterality Date   ARTERY BIOPSY Left 02/18/2022   Procedure: LEFT TEMPORAL ARTERY BIOPSY;  Surgeon: Kieth Brightly Arta Bruce, MD;  Location: [facility];  Service: General;  Laterality: Left;   cataract surgery Bilateral    COLONOSCOPY  06/26/2017   [facility]   COLONOSCOPY WITH PROPOFOL N/A 11/27/2014   Procedure: COLONOSCOPY WITH PROPOFOL;  Surgeon: Milus Banister, MD;  Location: [facility];  Service: Endoscopy;  Laterality: N/A;   CORONARY ANGIOPLASTY     CORONARY STENT PLACEMENT  2011   Cypher; in distal circumflex artery   CYSTOSCOPY  1998   IR URETERAL STENT LEFT NEW ACCESS W/O SEP NEPHROSTOMY CATH  06/21/2021   KNEE SURGERY     right   LEFT HEART CATH AND CORONARY ANGIOGRAPHY N/A 01/31/2020   Procedure: LEFT HEART CATH AND CORONARY ANGIOGRAPHY;  Surgeon: Belva Crome, MD;  Location: [facility];  Service: Cardiovascular;  Laterality: N/A;   NEPHROLITHOTOMY Left 06/21/2021   Procedure: NEPHROLITHOTOMY PERCUTANEOUS;  Surgeon: Franchot Gallo, MD;  Location: [facility];  Service: Urology;  Laterality: Left;  90 MINS   SHOULDER SURGERY Left 07/2013   Dr Shara Blazing   UPPER GASTROINTESTINAL ENDOSCOPY      Allergies  Allergen Reactions   Biaxin [Clarithromycin]     Dizziness   Tizanidine     Felt funny  Allantoin-Pramoxine Other (See Comments)    Pt does't remember reaction   Amlodipine Swelling    Edema    Amoxicillin Rash    Rash on arms that developed towards end of 7 day treatment   Benzalkonium Chloride Itching and Rash   Doxycycline Photosensitivity and Rash   Metoprolol Tartrate Other (See Comments)    REACTION: fatigue    Prior to Admission medications   Medication Sig Start Date End Date Taking? Authorizing Provider  acetaminophen (TYLENOL) 500 MG tablet Take 1 tablet (500 mg total) by mouth every 6 (six) hours as needed for up to 30 doses. 02/18/22  Yes Kinsinger, Arta Bruce, MD  calcitRIOL (ROCALTROL) 0.25 MCG capsule TAKE 1 CAPSULE BY MOUTH EVERY DAY  03/02/22  Yes Plotnikov, Evie Lacks, MD  carvedilol (COREG) 25 MG tablet Take 12.5 mg by mouth 2 (two) times daily with a meal.   Yes [provider]  cetirizine (ZYRTEC) 10 MG tablet Take 10 mg by mouth daily as needed for allergies.   Yes [provider]  Cholecalciferol 2000 UNITS TABS Take 2,000 Units by mouth daily.   Yes [provider]  clonazePAM (KLONOPIN) 0.25 MG disintegrating tablet TAKE 1 TABLET (0.25 MG TOTAL) BY MOUTH 2 (TWO) TIMES DAILY AS NEEDED (ANXIETY). 04/19/21  Yes Plotnikov, Evie Lacks, MD  diclofenac sodium (VOLTAREN) 1 % GEL APPLY 4 G TOPICALLY 4 TIMES DAILY. Patient taking differently: Apply 4 g topically 4 (four) times daily as needed (pain). 02/13/18  Yes Plotnikov, Evie Lacks, MD  hydrALAZINE (APRESOLINE) 10 MG tablet Take 1-2 tablet 3 times a day as needed if blood pressure is >170 04/07/20  Yes Plotnikov, Evie Lacks, MD  isosorbide mononitrate (IMDUR) 30 MG 24 hr tablet Take 1 tablet (30 mg total) by mouth daily. 10/28/21  Yes Plotnikov, Evie Lacks, MD  levothyroxine (SYNTHROID) 175 MCG tablet TAKE 1 TABLET BY MOUTH EVERY DAY 03/02/22  Yes Plotnikov, Evie Lacks, MD  lipase/protease/amylase (CREON) 36000 UNITS CPEP capsule Take 2 capsules (72,000 Units total) by mouth 3 (three) times daily with meals. May also take 1 capsule (36,000 Units total) as needed (with snacks). 04/25/22  Yes Plotnikov, Evie Lacks, MD  Multiple Vitamins-Minerals (PRESERVISION AREDS 2) CAPS Take 1 capsule by mouth in the morning and at bedtime.   Yes [provider]  mupirocin ointment (BACTROBAN) 2 % Apply 1 application topically 2 (two) times daily. Apply to wounds, cuts to prevent infection Patient taking differently: Apply 1 application  topically daily as needed (cellulitis flare). 12/13/21  Yes Tommy Medal, Lavell Islam, MD  olmesartan [facility]) 5 MG tablet TAKE 1 TABLET BY MOUTH EVERY DAY 03/02/22  Yes Sherren Mocha, MD  pantoprazole (PROTONIX) 40 MG tablet Take 1 tablet (40 mg  total) by mouth 2 (two) times daily. Patient taking differently: Take 40 mg by mouth daily. 02/14/22 02/14/23 Yes Plotnikov, Evie Lacks, MD  predniSONE (DELTASONE) 10 MG tablet Take 10 mg by mouth daily with breakfast.   Yes [provider]  Propylene Glycol (SYSTANE BALANCE) 0.6 % SOLN Place 1 drop into both eyes daily as needed (Dry eye).   Yes [provider]  simvastatin (ZOCOR) 10 MG tablet TAKE 1 TABLET BY MOUTH EVERYDAY AT BEDTIME 07/14/21  Yes Plotnikov, Evie Lacks, MD  tamsulosin (FLOMAX) 0.4 MG CAPS capsule Take 0.4 mg by mouth daily.   Yes [provider]  testosterone cypionate (DEPOTESTOSTERONE CYPIONATE) 200 MG/ML injection INJECT 2ML INTO MUSCLE EVERY 2 WEEKS 04/19/22  Yes Plotnikov, Evie Lacks, MD  nitroGLYCERIN (NITROSTAT) 0.4 MG SL tablet Place 0.4 mg under the tongue every 5 (five) minutes as needed for chest pain.    [provider]  SYRINGE-NEEDLE, DISP, 3 ML (BD ECLIPSE SYRINGE) 23G X 1" 3 ML MISC As directed IM 11/24/20   Plotnikov, Evie Lacks, MD    Social History   Socioeconomic History   Marital status: Married    Spouse name: Pavlos Yon   Number of children: 3   Years of education: Not on file   Highest education level: Not on file  Occupational History   Occupation: retired    Fish farm manager: RETIRED    Comment: Engineering  Tobacco Use   Smoking status: Never   Smokeless tobacco: Never  Vaping Use   Vaping Use: Never used  Substance and Sexual Activity   Alcohol use: No   Drug use: No   Sexual activity: Yes  Other Topics Concern   Not on file  Social History Narrative   Retired.   Regular Exercise-Yes; yoga & silver sneakers   Daily Caffeine Use.            Social Determinants of Health   Financial Resource Strain: Low Risk  (04/21/2021)   Overall Financial Resource Strain (CARDIA)    Difficulty of Paying Living Expenses: Not hard at all  Food Insecurity: No Food Insecurity (04/21/2021)   Hunger Vital Sign    Worried  About Running Out of Food in the Last Year: Never true    Ran Out of Food in the Last Year: Never true  Transportation Needs: No Transportation Needs (04/21/2021)   PRAPARE - Hydrologist (Medical): No    Lack of Transportation (Non-Medical): No  Physical Activity: Sufficiently Active (04/21/2021)   Exercise Vital Sign    Days of Exercise per Week: 5 days    Minutes of Exercise per Session: 30 min  Stress: No Stress Concern Present (04/21/2021)   [place]    Feeling of Stress : Not at all  Social Connections: [place] (04/21/2021)   Social Connection and Isolation Panel [NHANES]    Frequency of Communication with Friends and Family: More than three times a week    Frequency of Social Gatherings with Friends and Family: More than three times a week    Attends Religious Services: More than 4 times per year    Active Member of [employer] or Organizations: Yes    Attends Music therapist: More than 4 times per year    Marital Status: Married  Human resources officer Violence: Not At Risk (04/21/2021)   Humiliation, Afraid, Rape, and Kick questionnaire    Fear of Current or Ex-Partner: No    Emotionally Abused: No    Physically Abused: No    Sexually Abused: No     Family History  Problem Relation Age of Onset   Lymphoma Father    Hypertension Other    Vision loss Maternal Grandmother    Colon cancer Son 67   Asthma Neg Hx    Esophageal cancer Neg Hx    Rectal cancer Neg Hx    Stomach cancer Neg Hx    Colon polyps Neg Hx     ROS: [x]  Positive   [ ]  Negative   [ ]  All sytems reviewed and are negative  Cardiovascular: []  chest pain/pressure []  palpitations []  SOB lying flat []  DOE []  pain in legs while walking []  pain in legs at rest []   pain in legs at night []  non-healing ulcers []  hx of DVT [x]  swelling in legs - left leg  Pulmonary: []  productive cough []   asthma/wheezing []  home O2  Neurologic: []  weakness in []  arms []  legs []  numbness in []  arms []  legs []  hx of CVA []  mini stroke [] difficulty speaking or slurred speech []  temporary loss of vision in one eye []  dizziness  Hematologic: []  hx of cancer []  bleeding problems []  problems with blood clotting easily  Endocrine:   []  diabetes []  thyroid disease  GI []  vomiting blood []  blood in stool  GU: []  CKD/renal failure []  HD--[]  M/W/F or []  T/T/S []  burning with urination []  blood in urine  Psychiatric: []  anxiety []  depression  Musculoskeletal: []  arthritis []  joint pain  Integumentary: []  rashes []  ulcers  Constitutional: []  fever []  chills   Physical Examination  Vitals:   06/23/22 1815 06/23/22 1830  BP: 132/82 (!) 143/88  Pulse: 97 98  Resp: 11 13  Temp:    SpO2: 97% 99%   There is no height or weight on file to calculate BMI.  General:  NAD Gait: Not observed HENT: WNL, normocephalic Pulmonary: normal non-labored breathing Cardiac: regular, without  Murmurs, rubs or gallops Abdomen:  soft, NT/ND Vascular Exam/Pulses: Palpable dorsalis pedis pulse in the left foot Left leg swollen as pictured Musculoskeletal: no muscle wasting or atrophy  Neurologic: A&O X 3; Appropriate Affect ; SENSATION: normal; MOTOR FUNCTION:  moving all extremities equally. Speech is fluent/normal      CBC    Component Value Date/Time   WBC 9.8 06/23/2022 1638   RBC 4.78 06/23/2022 1638   HGB 14.0 06/23/2022 1638   HGB 15.6 01/28/2020 1213   HCT 44.3 06/23/2022 1638   HCT 45.7 01/28/2020 1213   PLT 67 (L) 06/23/2022 1638   PLT 72 (LL) 01/28/2020 1213   MCV 92.7 06/23/2022 1638   MCV 88 01/28/2020 1213   MCH 29.3 06/23/2022 1638   MCHC 31.6 06/23/2022 1638   RDW 16.2 (H) 06/23/2022 1638   RDW 15.6 (H) 01/28/2020 1213   LYMPHSABS 0.7 06/23/2022 1638   MONOABS 0.4 06/23/2022 1638   EOSABS 0.0 06/23/2022 1638   BASOSABS 0.0 06/23/2022 1638    BMET     Component Value Date/Time   NA 138 06/23/2022 1638   NA 142 04/01/2020 1024   K 5.7 (H) 06/23/2022 1638   CL 109 06/23/2022 1638   CO2 24 06/23/2022 1638   GLUCOSE 159 (H) 06/23/2022 1638   BUN 73 (H) 06/23/2022 1638   BUN 20 04/01/2020 1024   CREATININE 2.32 (H) 06/23/2022 1638   CREATININE 2.07 (H) 12/13/2021 0241   CALCIUM 9.2 06/23/2022 1638   CALCIUM 9.1 07/02/2010 0445   GFRNONAA 28 (L) 06/23/2022 1638   GFRAA 35 (L) 04/01/2020 1024    COAGS: Lab Results  Component Value Date   INR 1.1 06/21/2021   INR 1.20 11/26/2014     Non-Invasive Vascular Imaging:    LE venous duplex:  LEFT:  - Findings consistent with acute deep vein thrombosis involving the  external illiac vein, left common femoral vein, SF junction, left femoral  vein, left proximal profunda vein, left popliteal vein, left posterior  tibial veins, left peroneal veins, and  left gastrocnemius veins.  - No cystic structure found in the popliteal fossa.   ASSESSMENT/PLAN: This is a 81 y.o. male with extensive left leg DVT with iliofemoral involvement and proximal extension.  I do not think he  has any evidence of phlegmasia as he has an easily palpable dorsalis pedis pulse in the left foot.  He does have an extensive DVT.  I have recommended admission to the hospitalist to start him on heparin.  I would keep him n.p.o. after midnight.  I discussed percutaneous mechanical thrombectomy tomorrow in the Cath Lab with one of my partners.  I do think he would be a good candidate given he is very active and participates in Silver sneakers.  We discussed this being done for reduction of post thrombotic syndrome.  Discussed him being prone in the Cath Lab with moderate sedation.  I think he will require 6 months of anticoagulation for first-time provoked DVT regardless of whether he pursues intervention.   Marty Heck, MD [facility] [place] Office: [place]

## 2022-06-24 ENCOUNTER — Telehealth: Payer: Self-pay | Admitting: Vascular Surgery

## 2022-06-24 ENCOUNTER — Other Ambulatory Visit (HOSPITAL_COMMUNITY): Payer: Self-pay

## 2022-06-24 ENCOUNTER — Telehealth (HOSPITAL_COMMUNITY): Payer: Self-pay

## 2022-06-24 ENCOUNTER — Encounter (HOSPITAL_COMMUNITY)
Admission: EM | Disposition: A | Discharge status: Home / Self Care | Payer: Self-pay | Source: Home / Self Care | Attending: Internal Medicine

## 2022-06-24 DIAGNOSIS — N179 Acute kidney failure, unspecified: Secondary | ICD-10-CM | POA: Diagnosis not present

## 2022-06-24 DIAGNOSIS — I82422 Acute embolism and thrombosis of left iliac vein: Secondary | ICD-10-CM | POA: Diagnosis not present

## 2022-06-24 DIAGNOSIS — I2510 Atherosclerotic heart disease of native coronary artery without angina pectoris: Secondary | ICD-10-CM | POA: Diagnosis not present

## 2022-06-24 DIAGNOSIS — K900 Celiac disease: Secondary | ICD-10-CM

## 2022-06-24 DIAGNOSIS — M1990 Unspecified osteoarthritis, unspecified site: Secondary | ICD-10-CM | POA: Diagnosis not present

## 2022-06-24 HISTORY — PX: PERIPHERAL VASCULAR THROMBECTOMY: CATH118306

## 2022-06-24 LAB — CBC WITH DIFFERENTIAL/PLATELET
Abs Immature Granulocytes: 0.06 10*3/uL (ref 0.00–0.07)
Basophils Absolute: 0 10*3/uL (ref 0.0–0.1)
Basophils Relative: 0 %
Eosinophils Absolute: 0 10*3/uL (ref 0.0–0.5)
Eosinophils Relative: 1 %
HCT: 38.4 % — ABNORMAL LOW (ref 39.0–52.0)
Hemoglobin: 12.5 g/dL — ABNORMAL LOW (ref 13.0–17.0)
Immature Granulocytes: 1 %
Lymphocytes Relative: 20 %
Lymphs Abs: 1.2 10*3/uL (ref 0.7–4.0)
MCH: 29.6 pg (ref 26.0–34.0)
MCHC: 32.6 g/dL (ref 30.0–36.0)
MCV: 91 fL (ref 80.0–100.0)
Monocytes Absolute: 0.3 10*3/uL (ref 0.1–1.0)
Monocytes Relative: 6 %
Neutro Abs: 4.5 10*3/uL (ref 1.7–7.7)
Neutrophils Relative %: 72 %
Platelets: 46 10*3/uL — ABNORMAL LOW (ref 150–400)
RBC: 4.22 MIL/uL (ref 4.22–5.81)
RDW: 16.4 % — ABNORMAL HIGH (ref 11.5–15.5)
WBC: 6.1 10*3/uL (ref 4.0–10.5)
nRBC: 0 % (ref 0.0–0.2)

## 2022-06-24 LAB — BASIC METABOLIC PANEL
Anion gap: 5 (ref 5–15)
BUN: 57 mg/dL — ABNORMAL HIGH (ref 8–23)
CO2: 23 mmol/L (ref 22–32)
Calcium: 8.5 mg/dL — ABNORMAL LOW (ref 8.9–10.3)
Chloride: 111 mmol/L (ref 98–111)
Creatinine, Ser: 1.93 mg/dL — ABNORMAL HIGH (ref 0.61–1.24)
GFR, Estimated: 35 mL/min — ABNORMAL LOW (ref >60)
Glucose, Bld: 95 mg/dL (ref 70–99)
Potassium: 4.2 mmol/L (ref 3.5–5.1)
Sodium: 139 mmol/L (ref 135–145)

## 2022-06-24 LAB — HEPARIN LEVEL (UNFRACTIONATED)
Heparin Unfractionated: 0.85 IU/mL — ABNORMAL HIGH (ref 0.30–0.70)
Heparin Unfractionated: 0.23 IU/mL — ABNORMAL LOW (ref 0.30–0.70)

## 2022-06-24 LAB — LACTIC ACID, PLASMA: Lactic Acid, Venous: 1.4 mmol/L (ref 0.5–1.9)

## 2022-06-24 LAB — PLATELET COUNT: Platelets: 50 10*3/uL — ABNORMAL LOW (ref 150–400)

## 2022-06-24 SURGERY — PERIPHERAL VASCULAR THROMBECTOMY
Anesthesia: LOCAL | Laterality: Left

## 2022-06-24 MED ORDER — SODIUM CHLORIDE 0.9% FLUSH
3.0000 mL | Freq: Two times a day (BID) | INTRAVENOUS | Status: DC
  Administered 2022-06-24: 3 mL via INTRAVENOUS

## 2022-06-24 MED ORDER — SODIUM CHLORIDE 0.9 % IV SOLN: INTRAVENOUS | Status: DC

## 2022-06-24 MED ORDER — HEPARIN (PORCINE) IN NACL 1000-0.9 UT/500ML-% IV SOLN
INTRAVENOUS | Status: AC
  Filled 2022-06-24: qty 500

## 2022-06-24 MED ORDER — LABETALOL HCL 5 MG/ML IV SOLN: 10.0000 mg | INTRAVENOUS | Status: DC | PRN

## 2022-06-24 MED ORDER — MIDAZOLAM HCL 2 MG/2ML IJ SOLN
INTRAMUSCULAR | Status: AC
  Filled 2022-06-24: qty 2

## 2022-06-24 MED ORDER — LIDOCAINE HCL (PF) 1 % IJ SOLN
INTRAMUSCULAR | Status: DC | PRN
  Administered 2022-06-24: 18 mL via INTRADERMAL

## 2022-06-24 MED ORDER — LIDOCAINE HCL (PF) 1 % IJ SOLN
INTRAMUSCULAR | Status: AC
  Filled 2022-06-24: qty 30

## 2022-06-24 MED ORDER — SODIUM CHLORIDE 0.9 % WEIGHT BASED INFUSION: 1.0000 mL/kg/h | INTRAVENOUS | Status: AC

## 2022-06-24 MED ORDER — SODIUM CHLORIDE 0.9% FLUSH
3.0000 mL | INTRAVENOUS | Status: DC | PRN
Start: 2022-06-25 — End: 2022-06-25

## 2022-06-24 MED ORDER — SODIUM CHLORIDE 0.9 % IV SOLN
250.0000 mL | INTRAVENOUS | Status: DC | PRN
Start: 2022-06-25 — End: 2022-06-25

## 2022-06-24 MED ORDER — HEPARIN (PORCINE) IN NACL 1000-0.9 UT/500ML-% IV SOLN
INTRAVENOUS | Status: DC | PRN
  Administered 2022-06-24: 500 mL

## 2022-06-24 MED ORDER — FENTANYL CITRATE (PF) 100 MCG/2ML IJ SOLN
INTRAMUSCULAR | Status: AC
  Filled 2022-06-24: qty 2

## 2022-06-24 MED ORDER — ONDANSETRON HCL 4 MG/2ML IJ SOLN: 4.0000 mg | Freq: Four times a day (QID) | INTRAMUSCULAR | Status: DC | PRN

## 2022-06-24 MED ORDER — ACETAMINOPHEN 325 MG PO TABS: 650.0000 mg | ORAL_TABLET | ORAL | Status: DC | PRN

## 2022-06-24 MED ORDER — MIDAZOLAM HCL 2 MG/2ML IJ SOLN
INTRAMUSCULAR | Status: DC | PRN
  Administered 2022-06-24: 1 mg via INTRAVENOUS

## 2022-06-24 MED ORDER — IODIXANOL 320 MG/ML IV SOLN
INTRAVENOUS | Status: DC | PRN
  Administered 2022-06-24: 10 mL via INTRAVENOUS

## 2022-06-24 MED ORDER — HEPARIN (PORCINE) 25000 UT/250ML-% IV SOLN
1250.0000 [IU]/h | INTRAVENOUS | Status: DC
  Administered 2022-06-24: 1100 [IU]/h via INTRAVENOUS
  Filled 2022-06-24 (×2): qty 250

## 2022-06-24 MED ORDER — HYDRALAZINE HCL 20 MG/ML IJ SOLN: 5.0000 mg | INTRAMUSCULAR | Status: DC | PRN

## 2022-06-24 MED ORDER — FENTANYL CITRATE (PF) 100 MCG/2ML IJ SOLN
INTRAMUSCULAR | Status: DC | PRN
  Administered 2022-06-24: 25 ug via INTRAVENOUS

## 2022-06-24 SURGICAL SUPPLY — 13 items
CATH ANGIO 5F BER2 65CM (CATHETERS) IMPLANT
CATH CLOT TRIEVER BOLD (CATHETERS) IMPLANT
CATH VISIONS PV .035 IVUS (CATHETERS) IMPLANT
GLIDEWIRE ADV .035X260CM (WIRE) IMPLANT
GLIDEWIRE NITREX 0.018X80X5 (WIRE) ×1
GUIDEWIRE NITREX 0.018X80X5 (WIRE) IMPLANT
KIT MICROPUNCTURE NIT STIFF (SHEATH) IMPLANT
PROTECTION STATION PRESSURIZED (MISCELLANEOUS) ×1
SHEATH CLOT RETRIEVER (SHEATH) IMPLANT
SHEATH PINNACLE 8F 10CM (SHEATH) IMPLANT
SHEATH PROBE COVER 6X72 (BAG) IMPLANT
STATION PROTECTION PRESSURIZED (MISCELLANEOUS) IMPLANT
TRAY PV CATH (CUSTOM PROCEDURE TRAY) IMPLANT

## 2022-06-24 NOTE — Progress Notes (Addendum)
PROGRESS NOTE    Scott Oneal  AQT:[id number] DOB: 1941-01-19 DOA: 06/23/2022 PCP: Cassandria Anger, MD   Brief Narrative:  Scott Oneal is a 81 y.o. male with medical history significant of CAD s/p DES, combined systolic and diastolic heart failure, and CKD stage IV, hypertension hyperlipidemia, chronic venous stasis changes who presents with increase left lower extremity swelling. Found to have extensive DVT in LLE - Vascular consulted. Hospitalist called to admit.  Assessment & Plan:   Principal Problem:   DVT (deep venous thrombosis) (HCC) Active Problems:   Hypothyroidism   Hypogonadism in male   Essential hypertension   Hyperkalemia   Thrombocytopenia (HCC)   Acute on chronic kidney failure (HCC)   CAD S/P percutaneous coronary angioplasty   Arthritis   Acute symptomatic DVT (deep venous thrombosis) (HCC) - Likely provoked DVT following trauma from fall on left lower extremity and on chronic hormonal therapy with testosterone. - Venous Doppler ultrasound showing extensive DVT involving the external iliac vein, common femoral vein, SF junction, proximal profunda vein, popliteal vein, posterior tibial vein, peroneal vein and gastrocnemius vein. -Continue heparin, chronic thrombocytopenia appears stable at this time, low threshold to transition to argatroban. -Vascular surgery plan for thrombectomy later today.   Acute on chronic kidney failure 4 (HCC) -creatinine of 2.32 with a baseline of perhaps about 2. -Continue IV fluids, minimize contrast   Thrombocytopenia (HCC) - Chronic, baseline around 50-60  -Close monitoring in the setting of heparin drip, low threshold to transition to p.o. anticoagulation in the next 24 hours pending tolerance of thrombectomy as above.   Hyperkalemia -Secondary to AKI, hypovolemia and hemoconcentration, resolved   Hypogonadism in male Patient has been receiving testosterone every 2 weeks for symptoms of fatigue.  Discussed increased  risk of thrombosis with hormonal replacement therapy. -Advised to follow-up with PCP regarding benefits and risk in the setting of new provoked DVT   Celiac Disease -Continue appropriate diet as tolerated  Hypothyroidism Continue home levothyroxine  DVT prophylaxis: Heparin drip Code Status: Full Family Communication: At bedside  Status is: Inpatient  Dispo: The patient is from: Home              Anticipated d/c is to: Home              Anticipated d/c date is: 24 to 48 hours              Patient currently not medically stable for discharge given ongoing need for vascular procedure, IV heparin and close monitoring in the setting of profound clot burden  Consultants:  Vascular Sx  Procedures:  Thrombectomy 06/24/2022  Antimicrobials:  None  Subjective: No acute issues or events overnight, denies nausea vomiting diarrhea constipation headache fevers chills or chest pain  Objective: Vitals:   06/24/22 0445 06/24/22 0457 06/24/22 0720 06/24/22 0747  BP: 105/75  131/71   Pulse: 69  85   Resp: 16  11   Temp:  97.9 F (36.6 C)  98 F (36.7 C)  TempSrc:  Oral  Oral  SpO2: 96%  100%     Intake/Output Summary (Last 24 hours) at 06/24/2022 0817 Last data filed at 06/24/2022 0737 Gross per 24 hour  Intake 753.75 ml  Output 650 ml  Net 103.75 ml   There were no vitals filed for this visit.  Examination:  General:  Pleasantly resting in bed, No acute distress. HEENT:  Normocephalic atraumatic.  Sclerae nonicteric, noninjected.  Extraocular movements intact bilaterally. Neck:  Without mass  or deformity.  Trachea is midline. Lungs:  Clear to auscultate bilaterally without rhonchi, wheeze, or rales. Heart:  Regular rate and rhythm.  Without murmurs, rubs, or gallops. Abdomen:  Soft, nontender, nondistended.  Without guarding or rebound. Extremities: Left lower extremity 2+ pitting edema to the knee with nonblanching erythema, profound tenderness on the posterior calf and  medial proximal thigh on the right Vascular:  Dorsalis pedis and posterior tibial pulses palpable bilaterally. Skin:  Warm and dry -left lower extremity erythema as above   Data Reviewed: I have personally reviewed following labs and imaging studies  CBC: Recent Labs  Lab 06/23/22 1638 06/24/22 0010 06/24/22 0411  WBC 9.8  --  6.1  NEUTROABS 8.6*  --  4.5  HGB 14.0  --  12.5*  HCT 44.3  --  38.4*  MCV 92.7  --  91.0  PLT 67* 50* 46*   Basic Metabolic Panel: Recent Labs  Lab 06/23/22 1638 06/23/22 1734 06/24/22 0411  NA 138  --  139  K 5.7* 5.4* 4.2  CL 109  --  111  CO2 24  --  23  GLUCOSE 159*  --  95  BUN 73*  --  57*  CREATININE 2.32*  --  1.93*  CALCIUM 9.2  --  8.5*   GFR: Estimated Creatinine Clearance: 33.5 mL/min (A) (by C-G formula based on SCr of 1.93 mg/dL (H)). Liver Function Tests: No results for input(s): "AST", "ALT", "ALKPHOS", "BILITOT", "PROT", "ALBUMIN" in the last 168 hours. No results for input(s): "LIPASE", "AMYLASE" in the last 168 hours. No results for input(s): "AMMONIA" in the last 168 hours. Coagulation Profile: Recent Labs  Lab 06/23/22 1734  INR 1.3*   Cardiac Enzymes: No results for input(s): "CKTOTAL", "CKMB", "CKMBINDEX", "TROPONINI" in the last 168 hours. BNP (last 3 results) No results for input(s): "PROBNP" in the last 8760 hours. HbA1C: No results for input(s): "HGBA1C" in the last 72 hours. CBG: No results for input(s): "GLUCAP" in the last 168 hours. Lipid Profile: No results for input(s): "CHOL", "HDL", "LDLCALC", "TRIG", "CHOLHDL", "LDLDIRECT" in the last 72 hours. Thyroid Function Tests: No results for input(s): "TSH", "T4TOTAL", "FREET4", "T3FREE", "THYROIDAB" in the last 72 hours. Anemia Panel: No results for input(s): "VITAMINB12", "FOLATE", "FERRITIN", "TIBC", "IRON", "RETICCTPCT" in the last 72 hours. Sepsis Labs: Recent Labs  Lab 06/23/22 1635 06/24/22 0411  LATICACIDVEN 2.1* 1.4    No results found  for this or any previous visit (from the past 240 hour(s)).   Radiology Studies: CT Head Wo Contrast  Result Date: 06/23/2022 CLINICAL DATA:  Head trauma, minor (Age >= 65y) EXAM: CT HEAD WITHOUT CONTRAST TECHNIQUE: Contiguous axial images were obtained from the base of the skull through the vertex without intravenous contrast. RADIATION DOSE REDUCTION: This exam was performed according to the departmental dose-optimization program which includes automated exposure control, adjustment of the mA and/or kV according to patient size and/or use of iterative reconstruction technique. COMPARISON:  None Available. FINDINGS: Brain: No acute intracranial abnormality. Specifically, no hemorrhage, hydrocephalus, mass lesion, acute infarction, or significant intracranial injury. Vascular: No hyperdense vessel or unexpected calcification. Skull: No acute calvarial abnormality. Sinuses/Orbits: No acute findings Other: None IMPRESSION: No acute intracranial abnormality. Electronically Signed   By: Rolm Baptise M.D.   On: 06/23/2022 19:04   VAS [place] LOWER EXTREMITY VENOUS (DVT) (7a-7p)  Result Date: 06/23/2022  Lower Venous DVT Study Patient Name:  Scott Oneal  Date of Exam:   06/23/2022 Medical Rec #: [id number]  Accession #:    [id number] Date of Birth: 1941/07/14     Patient Gender: M Patient Age:   58 years Exam Location:  [facility] Procedure:      VAS [place] LOWER EXTREMITY VENOUS (DVT) Referring Phys: Domenic Moras --------------------------------------------------------------------------------  Indications: Swelling.  Performing Technologist: Rogelia Rohrer RVT, RDMS  Examination Guidelines: A complete evaluation includes B-mode imaging, spectral Doppler, color Doppler, and power Doppler as needed of all accessible portions of each vessel. Bilateral testing is considered an integral part of a complete examination. Limited examinations for reoccurring indications may be performed as noted. The reflux portion of the  exam is performed with the patient in reverse Trendelenburg.  +-----+---------------+---------+-----------+----------+--------------+ RIGHTCompressibilityPhasicitySpontaneityPropertiesThrombus Aging +-----+---------------+---------+-----------+----------+--------------+ CFV  Full           Yes      Yes                                 +-----+---------------+---------+-----------+----------+--------------+   +---------+---------------+---------+-----------+----------+------------------+ LEFT     CompressibilityPhasicitySpontaneityPropertiesThrombus Aging     +---------+---------------+---------+-----------+----------+------------------+ CFV      None           No       No                   Acute              +---------+---------------+---------+-----------+----------+------------------+ SFJ      None                                         Acute              +---------+---------------+---------+-----------+----------+------------------+ FV Prox  None           No       No                   Acute              +---------+---------------+---------+-----------+----------+------------------+ FV Mid   None           No       No                   Acute              +---------+---------------+---------+-----------+----------+------------------+ FV DistalNone           No       No                   Acute              +---------+---------------+---------+-----------+----------+------------------+ PFV      None           No       No                   Acute              +---------+---------------+---------+-----------+----------+------------------+ POP      None           No       No                   Acute              +---------+---------------+---------+-----------+----------+------------------+ PTV      None  No       No                   Acute              +---------+---------------+---------+-----------+----------+------------------+ PERO      None           No       No                   Acute              +---------+---------------+---------+-----------+----------+------------------+ Gastroc  None           No       No                   Acute              +---------+---------------+---------+-----------+----------+------------------+ GSV      Full           No       No                                      +---------+---------------+---------+-----------+----------+------------------+ EIV      None           No       No                   Acute - mid &                                                            distal             +---------+---------------+---------+-----------+----------+------------------+    Summary: RIGHT: - No evidence of common femoral vein obstruction.  LEFT: - Findings consistent with acute deep vein thrombosis involving the external illiac vein, left common femoral vein, SF junction, left femoral vein, left proximal profunda vein, left popliteal vein, left posterior tibial veins, left peroneal veins, and left gastrocnemius veins. - No cystic structure found in the popliteal fossa.  *See table(s) above for measurements and observations.    Preliminary    DG Ankle Complete Left  Result Date: 06/23/2022 CLINICAL DATA:  Fall with bruising and swelling to left ankle. EXAM: LEFT ANKLE COMPLETE - 3+ VIEW COMPARISON:  None Available. FINDINGS: There is no evidence of fracture, dislocation, or joint effusion. Intact talar dome. Moderate Achilles tendon enthesophyte. There is generalized soft tissue thickening and edema. Vascular calcifications are seen. IMPRESSION: 1. Soft tissue thickening and edema. No acute osseous abnormality. 2. Moderate Achilles tendon enthesophyte. Electronically Signed   By: Keith Rake M.D.   On: 06/23/2022 17:00    Scheduled Meds:  calcitRIOL  0.25 mcg Oral Daily   carvedilol  12.5 mg Oral BID WC   cholecalciferol  2,000 Units Oral Daily   isosorbide mononitrate   30 mg Oral Daily   levothyroxine  175 mcg Oral Q0600   lipase/protease/amylase  72,000 Units Oral TID WC   pantoprazole  40 mg Oral Daily   predniSONE  10 mg Oral Q breakfast   simvastatin  10 mg Oral q1800   tamsulosin  0.4 mg Oral Daily   Continuous Infusions:  sodium  chloride 100 mL/hr at 06/24/22 0746   heparin 1,100 Units/hr (06/24/22 0454)     LOS: 1 day   Time spent: 72mn  Adalynd Donahoe C Quenna Doepke, DO [facility]  If 7PM-7AM, please contact night-coverage [email or web address]  06/24/2022, 8:17 AM

## 2022-06-24 NOTE — Telephone Encounter (Signed)
-----   Message from Cherre Robins, MD sent at 06/24/2022  1:31 PM EDT ----- Scott Oneal 06/24/2022 Procedure: 1) ultrasound-guided left popliteal vein access 2) intravascular ultrasound of inferior vena cava, left common iliac vein, left external iliac vein, left common femoral vein, left femoral vein. 3) mechanical thrombectomy of left common iliac vein, left external iliac vein, left common femoral vein, left femoral vein. 4) left lower extremity and central venogram (10 mL total contrast) 5) conscious sedation (35 minutes) Assistant: none Follow up: 4 weeks with [facility] PA Studies for follow up: none  Thank you! Gershon Mussel

## 2022-06-24 NOTE — Telephone Encounter (Signed)
Appt has been scheduled.

## 2022-06-24 NOTE — Progress Notes (Signed)
ANTICOAGULATION CONSULT NOTE  Pharmacy Consult for Heparin Indication: DVT  Allergies  Allergen Reactions   Biaxin [Clarithromycin]     Dizziness   Tizanidine     Felt funny   Allantoin-Pramoxine Other (See Comments)    Pt does't remember reaction   Amlodipine Swelling    Edema    Amoxicillin Rash    Rash on arms that developed towards end of 7 day treatment   Benzalkonium Chloride Itching and Rash   Doxycycline Photosensitivity and Rash   Metoprolol Tartrate Other (See Comments)    REACTION: fatigue    Patient Measurements:   Heparin Dosing Weight: 84.4 kg  Vital Signs: Temp: 98.3 F (36.8 C) (08/25 1947) Temp Source: Oral (08/25 1947) BP: 126/70 (08/25 1947) Pulse Rate: 75 (08/25 1947)  Labs: Recent Labs    06/23/22 1638 06/23/22 1734 06/24/22 0010 06/24/22 0411 06/24/22 2228  HGB 14.0  --   --  12.5*  --   HCT 44.3  --   --  38.4*  --   PLT 67*  --  50* 46*  --   LABPROT  --  15.7*  --   --   --   INR  --  1.3*  --   --   --   HEPARINUNFRC  --   --   --  0.85* 0.23*  CREATININE 2.32*  --   --  1.93*  --      Estimated Creatinine Clearance: 33.5 mL/min (A) (by C-G formula based on SCr of 1.93 mg/dL (H)).   Medical History: Past Medical History:  Diagnosis Date   Allergy    Arthritis    Calculus of gallbladder without mention of cholecystitis    Cataract    bilateral cateracts removed   Celiac disease    Chronic kidney disease    stage 3   Coronary atherosclerosis of unspecified type of vessel, native or graft    Dermatitis 06/08/2021   Erythema of lower extremity 09/08/2021   Esophageal reflux    Glaucoma    History of cellulitis 09/08/2021   History of kidney stones    Loss of weight    Lower extremity weakness 05/31/2021   Macular degeneration    Rt eye . injections   Old myocardial infarction    Osteoporosis, unspecified    Other malaise and fatigue    Other specified cardiac dysrhythmias(427.89)    Other testicular hypofunction     Pancreatic insufficiency 05/31/2021   Personal history of colonic polyps    Postural dizziness with presyncope 12/13/2021   Pure hypercholesterolemia    Thrombocytopenia ([place])    chronic, per PCP notes dating back to 2009   Tinea pedis 05/31/2021   Unspecified asthma(493.90)    Unspecified essential hypertension    Unspecified hypothyroidism    Venous stasis dermatitis 09/08/2021    Medications:  Medications Prior to Admission  Medication Sig Dispense Refill Last Dose   acetaminophen (TYLENOL) 500 MG tablet Take 1 tablet (500 mg total) by mouth every 6 (six) hours as needed for up to 30 doses. 30 tablet 0 unknown   calcitRIOL (ROCALTROL) 0.25 MCG capsule TAKE 1 CAPSULE BY MOUTH EVERY DAY 90 capsule 1 06/23/2022   carvedilol (COREG) 25 MG tablet Take 12.5 mg by mouth 2 (two) times daily with a meal.   06/23/2022 at 0600   cetirizine (ZYRTEC) 10 MG tablet Take 10 mg by mouth daily as needed for allergies.   Past Week   Cholecalciferol 2000 UNITS TABS Take  2,000 Units by mouth daily.   06/23/2022   clonazePAM (KLONOPIN) 0.25 MG disintegrating tablet TAKE 1 TABLET (0.25 MG TOTAL) BY MOUTH 2 (TWO) TIMES DAILY AS NEEDED (ANXIETY). 60 tablet 3 unknown   diclofenac sodium (VOLTAREN) 1 % GEL APPLY 4 G TOPICALLY 4 TIMES DAILY. (Patient taking differently: Apply 4 g topically 4 (four) times daily as needed (pain).) 200 g 5 unknown   hydrALAZINE (APRESOLINE) 10 MG tablet Take 1-2 tablet 3 times a day as needed if blood pressure is >170 60 tablet 3 unknown   isosorbide mononitrate (IMDUR) 30 MG 24 hr tablet Take 1 tablet (30 mg total) by mouth daily. 90 tablet 2 06/22/2022   levothyroxine (SYNTHROID) 175 MCG tablet TAKE 1 TABLET BY MOUTH EVERY DAY 90 tablet 3 06/22/2022   lipase/protease/amylase (CREON) 36000 UNITS CPEP capsule Take 2 capsules (72,000 Units total) by mouth 3 (three) times daily with meals. May also take 1 capsule (36,000 Units total) as needed (with snacks). 240 capsule 11 06/23/2022    Multiple Vitamins-Minerals (PRESERVISION AREDS 2) CAPS Take 1 capsule by mouth in the morning and at bedtime.   06/23/2022   mupirocin ointment (BACTROBAN) 2 % Apply 1 application topically 2 (two) times daily. Apply to wounds, cuts to prevent infection (Patient taking differently: Apply 1 application  topically daily as needed (cellulitis flare).) 22 g 0 unknown   olmesartan (BENICAR) 5 MG tablet TAKE 1 TABLET BY MOUTH EVERY DAY 90 tablet 0 06/23/2022   pantoprazole (PROTONIX) 40 MG tablet Take 1 tablet (40 mg total) by mouth 2 (two) times daily. (Patient taking differently: Take 40 mg by mouth daily.) 60 tablet 5 06/23/2022   predniSONE (DELTASONE) 10 MG tablet Take 10 mg by mouth daily with breakfast.   06/23/2022   Propylene Glycol (SYSTANE BALANCE) 0.6 % SOLN Place 1 drop into both eyes daily as needed (Dry eye).   unknown   simvastatin (ZOCOR) 10 MG tablet TAKE 1 TABLET BY MOUTH EVERYDAY AT BEDTIME 90 tablet 3 06/22/2022   tamsulosin (FLOMAX) 0.4 MG CAPS capsule Take 0.4 mg by mouth daily.   06/23/2022   testosterone cypionate (DEPOTESTOSTERONE CYPIONATE) 200 MG/ML injection INJECT 2ML INTO MUSCLE EVERY 2 WEEKS 12 mL 1 Past Month   nitroGLYCERIN (NITROSTAT) 0.4 MG SL tablet Place 0.4 mg under the tongue every 5 (five) minutes as needed for chest pain.   never used   SYRINGE-NEEDLE, DISP, 3 ML (BD ECLIPSE SYRINGE) 23G X 1" 3 ML MISC As directed IM 50 each 2     Scheduled:  Infusions:  PRN:   Assessment: 52 yom with a history of CAD s/p DES, HF, CKD, HTN, HLD, chronic venous stasis. Patient is presenting with leg swelling. Heparin per pharmacy consult placed for DVT.  [place] w/ acute DVT in LLE  Patient is not on anticoagulation prior to arrival.  Hgb 14; plt 67 PT/INR 15.7/1.3  8/26 AM update:  Heparin level low after re-start s/p OR  Goal of Therapy:  Heparin level 0.3-0.7 units/ml Monitor platelets by anticoagulation protocol: Yes   Plan:  Inc heparin to 1250 units/hr Re-check  heparin level in 8 hours  Narda Bonds, PharmD, [place] Pharmacist Phone: [phone]

## 2022-06-24 NOTE — TOC Benefit Eligibility Note (Signed)
Patient Teacher, English as a foreign language completed.    The patient is currently admitted and upon discharge could be taking Eliquis 5 mg.  The current 30 day co-pay is $27.38.   The patient is currently admitted and upon discharge could be taking Xarelto 20 mg.  The current 30 day co-pay is $26.49.   The patient is insured through [place], [place] Patient Advocate Specialist [facility] Patient Advocate Team Direct Number: [phone]  Fax: [phone]

## 2022-06-24 NOTE — Progress Notes (Signed)
[place] for Heparin Indication: DVT  Allergies  Allergen Reactions   Biaxin [Clarithromycin]     Dizziness   Tizanidine     Felt funny   Allantoin-Pramoxine Other (See Comments)    Pt does't remember reaction   Amlodipine Swelling    Edema    Amoxicillin Rash    Rash on arms that developed towards end of 7 day treatment   Benzalkonium Chloride Itching and Rash   Doxycycline Photosensitivity and Rash   Metoprolol Tartrate Other (See Comments)    REACTION: fatigue    Patient Measurements:   Heparin Dosing Weight: 84.4 kg  Vital Signs: Temp: 98.2 F (36.8 C) (08/24 2137) Temp Source: Oral (08/24 2137) BP: 100/69 (08/25 0230) Pulse Rate: 86 (08/25 0230)  Labs: Recent Labs    06/23/22 1638 06/23/22 1734 06/24/22 0010 06/24/22 0411  HGB 14.0  --   --  12.5*  HCT 44.3  --   --  38.4*  PLT 67*  --  50* 46*  LABPROT  --  15.7*  --   --   INR  --  1.3*  --   --   HEPARINUNFRC  --   --   --  0.85*  CREATININE 2.32*  --   --   --      Estimated Creatinine Clearance: 27.9 mL/min (A) (by C-G formula based on SCr of 2.32 mg/dL (H)).   Medical History: Past Medical History:  Diagnosis Date   Allergy    Arthritis    Calculus of gallbladder without mention of cholecystitis    Cataract    bilateral cateracts removed   Celiac disease    Chronic kidney disease    stage 3   Coronary atherosclerosis of unspecified type of vessel, native or graft    Dermatitis 06/08/2021   Erythema of lower extremity 09/08/2021   Esophageal reflux    Glaucoma    History of cellulitis 09/08/2021   History of kidney stones    Loss of weight    Lower extremity weakness 05/31/2021   Macular degeneration    Rt eye . injections   Old myocardial infarction    Osteoporosis, unspecified    Other malaise and fatigue    Other specified cardiac dysrhythmias(427.89)    Other testicular hypofunction    Pancreatic insufficiency 05/31/2021   Personal  history of colonic polyps    Postural dizziness with presyncope 12/13/2021   Pure hypercholesterolemia    Thrombocytopenia ([place])    chronic, per PCP notes dating back to 2009   Tinea pedis 05/31/2021   Unspecified asthma(493.90)    Unspecified essential hypertension    Unspecified hypothyroidism    Venous stasis dermatitis 09/08/2021    Medications:  (Not in a hospital admission)  Scheduled:  Infusions:  PRN:   Assessment: 24 yom with a history of CAD s/p DES, HF, CKD, HTN, HLD, chronic venous stasis. Patient is presenting with leg swelling. Heparin per pharmacy consult placed for DVT.  [place] w/ acute DVT in LLE  Patient is not on anticoagulation prior to arrival.  Hgb 14; plt 67 PT/INR 15.7/1.3  8/25 AM update:  Heparin level elevated Pt has chronically low plts  Goal of Therapy:  Heparin level 0.3-0.7 units/ml Monitor platelets by anticoagulation protocol: Yes   Plan:  Dec heparin to 1100 units/hr 1300 heparin level  Narda Bonds, PharmD, BCPS Clinical Pharmacist Phone: [phone]

## 2022-06-24 NOTE — Op Note (Signed)
DATE OF SERVICE: 06/24/2022  PATIENT:  Scott Oneal  81 y.o. male  PRE-OPERATIVE DIAGNOSIS:  left iliofemoral deep venous thrombosis  POST-OPERATIVE DIAGNOSIS:  Same  PROCEDURE:   1) ultrasound-guided left popliteal vein access 2) intravascular ultrasound of inferior vena cava, left common iliac vein, left external iliac vein, left common femoral vein, left femoral vein. 3) mechanical thrombectomy of left common iliac vein, left external iliac vein, left common femoral vein, left femoral vein. 4) left lower extremity and central venogram (10 mL total contrast) 5) conscious sedation (35 minutes)  SURGEON:  Surgeon(s) and Role:    * Jahanna Raether, Yevonne Aline, MD - Primary  ASSISTANT: None  ANESTHESIA:   local and IV sedation  EBL: minimal  BLOOD ADMINISTERED:none  DRAINS: none   LOCAL MEDICATIONS USED:  LIDOCAINE   SPECIMEN:  none  COUNTS: confirmed correct.  TOURNIQUET:  none   PATIENT DISPOSITION:  PACU - hemodynamically stable.   Delay start of Pharmacological VTE agent (>24hrs) due to surgical blood loss or risk of bleeding: no  INDICATION FOR PROCEDURE: JOH Oneal is a 81 y.o. male with left iliofemoral deep venous thrombosis which is highly symptomatic. After careful discussion of risks, benefits, and alternatives the patient was offered venogram and possible thrombectomy. The patient understood and wished to proceed.  OPERATIVE FINDINGS: Unremarkable access.  Intravascular ultrasound shows left external iliac, common femoral, femoral vein deep venous thrombosis which is occlusive.  Good technical result achieved with thrombectomy.  Repeat intravascular ultrasound shows resolution of iliac and common femoral thrombosis.  Ascending venogram also demonstrates lack of thrombus.  2-0 nylon bolstered suture put around the access with good hemostasis.  DESCRIPTION OF PROCEDURE: After identification of the patient in the pre-operative holding area, the patient was transferred to  the operating room. The patient was positioned supine on the operating room table. Anesthesia was induced. The left popliteal fossa was prepped and draped in standard fashion. A surgical pause was performed confirming correct patient, procedure, and operative location.  Using intraoperative ultrasound, the left popliteal vein was accessed with micropuncture technique.  Access was upsized to 8 [place].  A Glidewire advantage was navigated into the right internal jugular vein.  Over the wire and intravascular ultrasound was performed evaluating the inferior vena cava, left common iliac vein, left external iliac vein, left common femoral vein, left femoral vein.  Occlusive thrombus was noted as above.  An Inari device was prepped per manufacturer's instructions.  The 8 French sheath was removed and the 13 [place] Inari sheath was delivered into the popliteal vein.  4 passes of the Inari clot retriever were performed.  Good result was noted on each pass, with a significant burden of thrombus cleared in our catheter.  After the fourth pass I reevaluated the veins with intravascular ultrasound.  Near resolution of the thrombus was noted.  Introduced a Berenstein catheter over the wire to the central femoral vein and performed ascending venogram.  This reconfirmed the intravascular ultrasound findings.  Satisfied we ended the case here.  All endovascular Teresa Coombs was removed.  A figure-of-eight 2-0 nylon stitch was placed around her access site and a bolster placed under the suture to help with hemostasis.  The suture was secured.  A sterile bandage was applied.  Upon completion of the case instrument and sharps counts were confirmed correct. The patient was transferred to the PACU in good condition. I was present for all portions of the procedure.  Yevonne Aline. Stanford Breed, MD [facility] Number: [phone] 06/24/2022 1:24 PM

## 2022-06-24 NOTE — Progress Notes (Signed)
Scott Oneal for Heparin Indication: DVT  Allergies  Allergen Reactions   Biaxin [Clarithromycin]     Dizziness   Tizanidine     Felt funny   Allantoin-Pramoxine Other (See Comments)    Pt does't remember reaction   Amlodipine Swelling    Edema    Amoxicillin Rash    Rash on arms that developed towards end of 7 day treatment   Benzalkonium Chloride Itching and Rash   Doxycycline Photosensitivity and Rash   Metoprolol Tartrate Other (See Comments)    REACTION: fatigue    Patient Measurements:   Heparin Dosing Weight: 84.4 kg  Vital Signs: Temp: 98 F (36.7 C) (08/25 1221) Temp Source: Oral (08/25 1221) BP: 123/60 (08/25 1351) Pulse Rate: 81 (08/25 1351)  Labs: Recent Labs    06/23/22 1638 06/23/22 1734 06/24/22 0010 06/24/22 0411  HGB 14.0  --   --  12.5*  HCT 44.3  --   --  38.4*  PLT 67*  --  50* 46*  LABPROT  --  15.7*  --   --   INR  --  1.3*  --   --   HEPARINUNFRC  --   --   --  0.85*  CREATININE 2.32*  --   --  1.93*     Estimated Creatinine Clearance: 33.5 mL/min (A) (by C-G formula based on SCr of 1.93 mg/dL (H)).   Medical History: Past Medical History:  Diagnosis Date   Allergy    Arthritis    Calculus of gallbladder without mention of cholecystitis    Cataract    bilateral cateracts removed   Celiac disease    Chronic kidney disease    stage 3   Coronary atherosclerosis of unspecified type of vessel, native or graft    Dermatitis 06/08/2021   Erythema of lower extremity 09/08/2021   Esophageal reflux    Glaucoma    History of cellulitis 09/08/2021   History of kidney stones    Loss of weight    Lower extremity weakness 05/31/2021   Macular degeneration    Rt eye . injections   Old myocardial infarction    Osteoporosis, unspecified    Other malaise and fatigue    Other specified cardiac dysrhythmias(427.89)    Other testicular hypofunction    Pancreatic insufficiency 05/31/2021   Personal  history of colonic polyps    Postural dizziness with presyncope 12/13/2021   Pure hypercholesterolemia    Thrombocytopenia ([place])    chronic, per PCP notes dating back to 2009   Tinea pedis 05/31/2021   Unspecified asthma(493.90)    Unspecified essential hypertension    Unspecified hypothyroidism    Venous stasis dermatitis 09/08/2021    Medications:  Medications Prior to Admission  Medication Sig Dispense Refill Last Dose   acetaminophen (TYLENOL) 500 MG tablet Take 1 tablet (500 mg total) by mouth every 6 (six) hours as needed for up to 30 doses. 30 tablet 0 unknown   calcitRIOL (ROCALTROL) 0.25 MCG capsule TAKE 1 CAPSULE BY MOUTH EVERY DAY 90 capsule 1 06/23/2022   carvedilol (COREG) 25 MG tablet Take 12.5 mg by mouth 2 (two) times daily with a meal.   06/23/2022 at 0600   cetirizine (ZYRTEC) 10 MG tablet Take 10 mg by mouth daily as needed for allergies.   Past Week   Cholecalciferol 2000 UNITS TABS Take 2,000 Units by mouth daily.   06/23/2022   clonazePAM (KLONOPIN) 0.25 MG disintegrating tablet TAKE 1 TABLET (0.25 MG  TOTAL) BY MOUTH 2 (TWO) TIMES DAILY AS NEEDED (ANXIETY). 60 tablet 3 unknown   diclofenac sodium (VOLTAREN) 1 % GEL APPLY 4 G TOPICALLY 4 TIMES DAILY. (Patient taking differently: Apply 4 g topically 4 (four) times daily as needed (pain).) 200 g 5 unknown   hydrALAZINE (APRESOLINE) 10 MG tablet Take 1-2 tablet 3 times a day as needed if blood pressure is >170 60 tablet 3 unknown   isosorbide mononitrate (IMDUR) 30 MG 24 hr tablet Take 1 tablet (30 mg total) by mouth daily. 90 tablet 2 06/22/2022   levothyroxine (SYNTHROID) 175 MCG tablet TAKE 1 TABLET BY MOUTH EVERY DAY 90 tablet 3 06/22/2022   lipase/protease/amylase (CREON) 36000 UNITS CPEP capsule Take 2 capsules (72,000 Units total) by mouth 3 (three) times daily with meals. May also take 1 capsule (36,000 Units total) as needed (with snacks). 240 capsule 11 06/23/2022   Multiple Vitamins-Minerals (PRESERVISION AREDS 2)  CAPS Take 1 capsule by mouth in the morning and at bedtime.   06/23/2022   mupirocin ointment (BACTROBAN) 2 % Apply 1 application topically 2 (two) times daily. Apply to wounds, cuts to prevent infection (Patient taking differently: Apply 1 application  topically daily as needed (cellulitis flare).) 22 g 0 unknown   olmesartan (BENICAR) 5 MG tablet TAKE 1 TABLET BY MOUTH EVERY DAY 90 tablet 0 06/23/2022   pantoprazole (PROTONIX) 40 MG tablet Take 1 tablet (40 mg total) by mouth 2 (two) times daily. (Patient taking differently: Take 40 mg by mouth daily.) 60 tablet 5 06/23/2022   predniSONE (DELTASONE) 10 MG tablet Take 10 mg by mouth daily with breakfast.   06/23/2022   Propylene Glycol (SYSTANE BALANCE) 0.6 % SOLN Place 1 drop into both eyes daily as needed (Dry eye).   unknown   simvastatin (ZOCOR) 10 MG tablet TAKE 1 TABLET BY MOUTH EVERYDAY AT BEDTIME 90 tablet 3 06/22/2022   tamsulosin (FLOMAX) 0.4 MG CAPS capsule Take 0.4 mg by mouth daily.   06/23/2022   testosterone cypionate (DEPOTESTOSTERONE CYPIONATE) 200 MG/ML injection INJECT 2ML INTO MUSCLE EVERY 2 WEEKS 12 mL 1 Past Month   nitroGLYCERIN (NITROSTAT) 0.4 MG SL tablet Place 0.4 mg under the tongue every 5 (five) minutes as needed for chest pain.   never used   SYRINGE-NEEDLE, DISP, 3 ML (BD ECLIPSE SYRINGE) 23G X 1" 3 ML MISC As directed IM 50 each 2     Scheduled:  Infusions:  PRN:   Assessment: 35 yom with a history of CAD s/p DES, HF, CKD, HTN, HLD, chronic venous stasis. Patient is presenting with leg swelling and acute DVT in LLE. Heparin per pharmacy consult placed for DVT.  Patient is not on anticoagulation prior to arrival. Pltc low (MD aware), chronic issue. Pt s/p PV lab with mechanical thrombectomy performed. Pharmacy consulted to resume IV heparin now.   Goal of Therapy:  Heparin level 0.3-0.7 units/ml Monitor platelets by anticoagulation protocol: Yes   Plan:  Resume heparin 1100 units/h no bolus Check heparin level  in 8h Watch CBC and pltc closely  Arrie Senate, PharmD, BCPS, [facility] Clinical Pharmacist [phone] Please check AMION for all [place] numbers 06/24/2022

## 2022-06-24 NOTE — Telephone Encounter (Signed)
Pharmacy Patient Advocate Encounter  Insurance verification completed.    The patient is insured through [employer] Medicare Part D   The patient is currently admitted and ran test claims for the following: Eliquis, Xarelto.  Copays and coinsurance results were relayed to Inpatient clinical team.

## 2022-06-24 NOTE — Progress Notes (Signed)
[facility]  Subjective  -still has significant swelling to left leg even on heparin.   Objective 131/71 85 98 F (36.7 C) (Oral) 11 100%  Intake/Output Summary (Last 24 hours) at 06/24/2022 0821 Last data filed at 06/24/2022 0737 Gross per 24 hour  Intake 753.75 ml  Output 650 ml  Net 103.75 ml    Left DP palpable with significant edema, no signs phlegmasia  Laboratory Lab Results: Recent Labs    06/23/22 1638 06/24/22 0010 06/24/22 0411  WBC 9.8  --  6.1  HGB 14.0  --  12.5*  HCT 44.3  --  38.4*  PLT 67* 50* 46*   BMET Recent Labs    06/23/22 1638 06/23/22 1734 06/24/22 0411  NA 138  --  139  K 5.7* 5.4* 4.2  CL 109  --  111  CO2 24  --  23  GLUCOSE 159*  --  95  BUN 73*  --  57*  CREATININE 2.32*  --  1.93*  CALCIUM 9.2  --  8.5*    COAG Lab Results  Component Value Date   INR 1.3 (H) 06/23/2022   INR 1.1 06/21/2021   INR 1.20 11/26/2014   No results found for: "PTT"  Assessment/Planning:  81 year old male seen in the ED last night with extensive left leg DVT with iliofemoral involvement.  Given he is 54 and very active including Silver sneakers we recommended percutaneous mechanical thrombectomy of the left leg given iliac vein involvement.  He has decided to proceed.  Please keep NPO.  He is on the schedule today in the Cath Lab.  Continue heparin for now.  Discussed again 6 months of anticoagulation on DOAC for first-time provoked DVT after intervention.  Marty Heck 06/24/2022 8:21 AM --

## 2022-06-25 ENCOUNTER — Other Ambulatory Visit: Payer: Self-pay | Admitting: Cardiovascular Disease

## 2022-06-25 DIAGNOSIS — I2510 Atherosclerotic heart disease of native coronary artery without angina pectoris: Secondary | ICD-10-CM | POA: Diagnosis not present

## 2022-06-25 DIAGNOSIS — I82422 Acute embolism and thrombosis of left iliac vein: Secondary | ICD-10-CM | POA: Diagnosis not present

## 2022-06-25 DIAGNOSIS — N179 Acute kidney failure, unspecified: Secondary | ICD-10-CM | POA: Diagnosis not present

## 2022-06-25 DIAGNOSIS — M1990 Unspecified osteoarthritis, unspecified site: Secondary | ICD-10-CM | POA: Diagnosis not present

## 2022-06-25 LAB — CBC WITH DIFFERENTIAL/PLATELET
Abs Immature Granulocytes: 0.04 10*3/uL (ref 0.00–0.07)
Basophils Absolute: 0 10*3/uL (ref 0.0–0.1)
Basophils Relative: 0 %
Eosinophils Absolute: 0 10*3/uL (ref 0.0–0.5)
Eosinophils Relative: 0 %
HCT: 39.8 % (ref 39.0–52.0)
Hemoglobin: 12.8 g/dL — ABNORMAL LOW (ref 13.0–17.0)
Immature Granulocytes: 1 %
Lymphocytes Relative: 21 %
Lymphs Abs: 1 10*3/uL (ref 0.7–4.0)
MCH: 29.8 pg (ref 26.0–34.0)
MCHC: 32.2 g/dL (ref 30.0–36.0)
MCV: 92.6 fL (ref 80.0–100.0)
Monocytes Absolute: 0.3 10*3/uL (ref 0.1–1.0)
Monocytes Relative: 5 %
Neutro Abs: 3.6 10*3/uL (ref 1.7–7.7)
Neutrophils Relative %: 73 %
Platelets: 41 10*3/uL — ABNORMAL LOW (ref 150–400)
RBC: 4.3 MIL/uL (ref 4.22–5.81)
RDW: 16.3 % — ABNORMAL HIGH (ref 11.5–15.5)
WBC: 4.9 10*3/uL (ref 4.0–10.5)
nRBC: 0 % (ref 0.0–0.2)

## 2022-06-25 LAB — BASIC METABOLIC PANEL
Anion gap: 6 (ref 5–15)
BUN: 39 mg/dL — ABNORMAL HIGH (ref 8–23)
CO2: 22 mmol/L (ref 22–32)
Calcium: 8.5 mg/dL — ABNORMAL LOW (ref 8.9–10.3)
Chloride: 112 mmol/L — ABNORMAL HIGH (ref 98–111)
Creatinine, Ser: 1.72 mg/dL — ABNORMAL HIGH (ref 0.61–1.24)
GFR, Estimated: 40 mL/min — ABNORMAL LOW (ref >60)
Glucose, Bld: 106 mg/dL — ABNORMAL HIGH (ref 70–99)
Potassium: 4.8 mmol/L (ref 3.5–5.1)
Sodium: 140 mmol/L (ref 135–145)

## 2022-06-25 LAB — LIPID PANEL
Cholesterol: 143 mg/dL (ref 0–200)
HDL: 42 mg/dL (ref >40)
LDL Cholesterol: 83 mg/dL (ref 0–99)
Total CHOL/HDL Ratio: 3.4 RATIO
Triglycerides: 88 mg/dL (ref <150)
VLDL: 18 mg/dL (ref 0–40)

## 2022-06-25 LAB — HEPARIN LEVEL (UNFRACTIONATED): Heparin Unfractionated: 0.47 IU/mL (ref 0.30–0.70)

## 2022-06-25 MED ORDER — APIXABAN (ELIQUIS) VTE STARTER PACK (10MG AND 5MG): ORAL_TABLET | ORAL | 0 refills | Status: DC

## 2022-06-25 MED ORDER — ATORVASTATIN CALCIUM 80 MG PO TABS: 80.0000 mg | ORAL_TABLET | Freq: Every day | ORAL | 11 refills | Status: DC

## 2022-06-25 MED ORDER — APIXABAN 5 MG PO TABS
10.0000 mg | ORAL_TABLET | ORAL | Status: AC
  Administered 2022-06-25: 10 mg via ORAL
  Filled 2022-06-25: qty 2

## 2022-06-25 MED ORDER — APIXABAN 5 MG PO TABS: ORAL_TABLET | ORAL | 0 refills | Status: DC

## 2022-06-25 NOTE — Discharge Summary (Signed)
Physician Discharge Summary  Scott Oneal GDJ:[id number] DOB: 1941/07/09 DOA: 06/23/2022  PCP: Cassandria Anger, MD  Admit date: 06/23/2022 Discharge date: 06/25/2022  Admitted From: Home Disposition: Home  Recommendations for Outpatient Follow-up:  Follow up with PCP in 1-2 weeks Follow-up with vascular surgery as scheduled  Home Health: None Equipment/Devices: None  Discharge Condition: Stable CODE STATUS: Full Diet recommendation: Low-salt low-fat diet  Brief/Interim Summary: Scott Oneal is a 81 y.o. male with medical history significant of CAD s/p DES, combined systolic and diastolic heart failure, and CKD stage IV, hypertension hyperlipidemia, chronic venous stasis changes who presents with increase left lower extremity swelling. Found to have extensive DVT in LLE - Vascular consulted. Hospitalist called to admit.  Patient admitted as above with acute symptomatic DVT, provoked in the setting of recent trauma, given found extension of DVT patient was evaluated by vascular surgery who completed thrombectomy on 06/24/2022.  Procedure was tolerated quite well otherwise transitioning off of IV heparin to p.o. Eliquis per discussion and stable for discharge.  Postop follow-up in vascular clinic as scheduled.  Follow-up with PCP in the next 1 to 2 weeks for repeat labs and to ensure appropriate tolerance of anticoagulation in setting of chronic thrombocytopenia.  Patient's medications otherwise as below, discontinue olmesartan in the setting of recent AKI with improving blood pressure, unclear if this medication will need to be restarted in the near future, defer to PCP.  Also encourage patient to discuss testosterone replacement with PCP given risk factor of DVT, again given its provoked status in the setting of trauma it may be reasonable to resume this medication however full risks and benefits should be discussed with primary team.  Patient's other chronic comorbid conditions as below  remained stable.  Discharge Diagnoses:  Principal Problem:   DVT (deep venous thrombosis) (HCC) Active Problems:   Hypothyroidism   Hypogonadism in male   Essential hypertension   Hyperkalemia   Thrombocytopenia (HCC)   Acute on chronic kidney failure (HCC)   CAD S/P percutaneous coronary angioplasty   Arthritis    Discharge Instructions   Allergies as of 06/25/2022       Reactions   Biaxin [clarithromycin]    Dizziness   Tizanidine    Felt funny   Allantoin-pramoxine Other (See Comments)   Pt does't remember reaction   Amlodipine Swelling   Edema    Amoxicillin Rash   Rash on arms that developed towards end of 7 day treatment   Benzalkonium Chloride Itching, Rash   Doxycycline Photosensitivity, Rash   Metoprolol Tartrate Other (See Comments)   REACTION: fatigue        Medication List     STOP taking these medications    olmesartan 5 MG tablet Commonly known as: BENICAR       TAKE these medications    acetaminophen 500 MG tablet Commonly known as: TYLENOL Take 1 tablet (500 mg total) by mouth every 6 (six) hours as needed for up to 30 doses.   Apixaban Starter Pack (32m and 5542m Commonly known as: ELIQUIS STARTER PACK Take as directed on package: start with two-42m46mablets twice daily for 7 days. On day 8, switch to one-42mg66mblet twice daily.   BD Eclipse Syringe 23G X 1" 3 ML Misc Generic drug: SYRINGE-NEEDLE (DISP) 3 ML As directed IM   calcitRIOL 0.25 MCG capsule Commonly known as: ROCALTROL TAKE 1 CAPSULE BY MOUTH EVERY DAY   carvedilol 25 MG tablet Commonly known as: COREG Take 12.5 mg  by mouth 2 (two) times daily with a meal.   cetirizine 10 MG tablet Commonly known as: ZYRTEC Take 10 mg by mouth daily as needed for allergies.   Cholecalciferol 50 MCG (2000 UT) Tabs Take 2,000 Units by mouth daily.   clonazePAM 0.25 MG disintegrating tablet Commonly known as: KLONOPIN TAKE 1 TABLET (0.25 MG TOTAL) BY MOUTH 2 (TWO) TIMES DAILY  AS NEEDED (ANXIETY).   diclofenac sodium 1 % Gel Commonly known as: VOLTAREN APPLY 4 G TOPICALLY 4 TIMES DAILY. What changed: See the new instructions.   hydrALAZINE 10 MG tablet Commonly known as: APRESOLINE Take 1-2 tablet 3 times a day as needed if blood pressure is >170   isosorbide mononitrate 30 MG 24 hr tablet Commonly known as: IMDUR Take 1 tablet (30 mg total) by mouth daily.   levothyroxine 175 MCG tablet Commonly known as: SYNTHROID TAKE 1 TABLET BY MOUTH EVERY DAY   lipase/protease/amylase 36000 UNITS Cpep capsule Commonly known as: Creon Take 2 capsules (72,000 Units total) by mouth 3 (three) times daily with meals. May also take 1 capsule (36,000 Units total) as needed (with snacks).   mupirocin ointment 2 % Commonly known as: BACTROBAN Apply 1 application topically 2 (two) times daily. Apply to wounds, cuts to prevent infection What changed:  when to take this reasons to take this additional instructions   nitroGLYCERIN 0.4 MG SL tablet Commonly known as: NITROSTAT Place 0.4 mg under the tongue every 5 (five) minutes as needed for chest pain.   pantoprazole 40 MG tablet Commonly known as: Protonix Take 1 tablet (40 mg total) by mouth 2 (two) times daily. What changed: when to take this   predniSONE 10 MG tablet Commonly known as: DELTASONE Take 10 mg by mouth daily with breakfast.   PreserVision AREDS 2 Caps Take 1 capsule by mouth in the morning and at bedtime.   simvastatin 10 MG tablet Commonly known as: ZOCOR TAKE 1 TABLET BY MOUTH EVERYDAY AT BEDTIME   Systane Balance 0.6 % Soln Generic drug: Propylene Glycol Place 1 drop into both eyes daily as needed (Dry eye).   tamsulosin 0.4 MG Caps capsule Commonly known as: FLOMAX Take 0.4 mg by mouth daily.   testosterone cypionate 200 MG/ML injection Commonly known as: DEPOTESTOSTERONE CYPIONATE INJECT 2ML INTO MUSCLE EVERY 2 WEEKS        Follow-up Information     [facility] Follow up in 5 week(s).   Specialty: Vascular Surgery Contact information: [address] [place] [address] [phone]               Allergies  Allergen Reactions   Biaxin [Clarithromycin]     Dizziness   Tizanidine     Felt funny   Allantoin-Pramoxine Other (See Comments)    Pt does't remember reaction   Amlodipine Swelling    Edema    Amoxicillin Rash    Rash on arms that developed towards end of 7 day treatment   Benzalkonium Chloride Itching and Rash   Doxycycline Photosensitivity and Rash   Metoprolol Tartrate Other (See Comments)    REACTION: fatigue    Consultations: Vascular surgery  Procedures/Studies: PERIPHERAL VASCULAR CATHETERIZATION  Result Date: 06/24/2022 DATE OF SERVICE: 06/24/2022  PATIENT:  Arlice Colt  81 y.o. male  PRE-OPERATIVE DIAGNOSIS:  left iliofemoral deep venous thrombosis  POST-OPERATIVE DIAGNOSIS:  Same  PROCEDURE:  1) ultrasound-guided left popliteal vein access 2) intravascular ultrasound of inferior vena cava, left common iliac vein,  left external iliac vein, left common femoral vein, left femoral vein. 3) mechanical thrombectomy of left common iliac vein, left external iliac vein, left common femoral vein, left femoral vein. 4) left lower extremity and central venogram (10 mL total contrast) 5) conscious sedation (35 minutes)  SURGEON:  Surgeon(s) and Role:    * Hawken, Yevonne Aline, MD - Primary  ASSISTANT: None  ANESTHESIA:   local and IV sedation  EBL: minimal  BLOOD ADMINISTERED:none  DRAINS: none  LOCAL MEDICATIONS USED:  LIDOCAINE  SPECIMEN:  none  COUNTS: confirmed correct.  TOURNIQUET:  none  PATIENT DISPOSITION:  PACU - hemodynamically stable.  Delay start of Pharmacological VTE agent (>24hrs) due to surgical blood loss or risk of bleeding: no  INDICATION FOR PROCEDURE: EWELL BENASSI is a 81 y.o. male with left iliofemoral deep venous thrombosis which is highly symptomatic. After careful  discussion of risks, benefits, and alternatives the patient was offered venogram and possible thrombectomy. The patient understood and wished to proceed.  OPERATIVE FINDINGS: Unremarkable access.  Intravascular ultrasound shows left external iliac, common femoral, femoral vein deep venous thrombosis which is occlusive.  Good technical result achieved with thrombectomy.  Repeat intravascular ultrasound shows resolution of iliac and common femoral thrombosis.  Ascending venogram also demonstrates lack of thrombus.  2-0 nylon bolstered suture put around the access with good hemostasis.  DESCRIPTION OF PROCEDURE: After identification of the patient in the pre-operative holding area, the patient was transferred to the operating room. The patient was positioned supine on the operating room table. Anesthesia was induced. The left popliteal fossa was prepped and draped in standard fashion. A surgical pause was performed confirming correct patient, procedure, and operative location.  Using intraoperative ultrasound, the left popliteal vein was accessed with micropuncture technique.  Access was upsized to 8 [place].  A Glidewire advantage was navigated into the right internal jugular vein.  Over the wire and intravascular ultrasound was performed evaluating the inferior vena cava, left common iliac vein, left external iliac vein, left common femoral vein, left femoral vein.  Occlusive thrombus was noted as above.  An Inari device was prepped per manufacturer's instructions.  The 8 French sheath was removed and the 13 [place] Inari sheath was delivered into the popliteal vein.  4 passes of the Inari clot retriever were performed.  Good result was noted on each pass, with a significant burden of thrombus cleared in our catheter.  After the fourth pass I reevaluated the veins with intravascular ultrasound.  Near resolution of the thrombus was noted.  Introduced a Berenstein catheter over the wire to the central femoral vein and  performed ascending venogram.  This reconfirmed the intravascular ultrasound findings.  Satisfied we ended the case here.  All endovascular Teresa Coombs was removed.  A figure-of-eight 2-0 nylon stitch was placed around her access site and a bolster placed under the suture to help with hemostasis.  The suture was secured.  A sterile bandage was applied.  Upon completion of the case instrument and sharps counts were confirmed correct. The patient was transferred to the PACU in good condition. I was present for all portions of the procedure.  Yevonne Aline. Stanford Breed, MD [facility] Phone Number: [phone] 06/24/2022 1:24 PM   CT Head Wo Contrast  Result Date: 06/23/2022 CLINICAL DATA:  Head trauma, minor (Age >= 65y) EXAM: CT HEAD WITHOUT CONTRAST TECHNIQUE: Contiguous axial images were obtained from the base of the skull through the vertex without intravenous contrast.  RADIATION DOSE REDUCTION: This exam was performed according to the departmental dose-optimization program which includes automated exposure control, adjustment of the mA and/or kV according to patient size and/or use of iterative reconstruction technique. COMPARISON:  None Available. FINDINGS: Brain: No acute intracranial abnormality. Specifically, no hemorrhage, hydrocephalus, mass lesion, acute infarction, or significant intracranial injury. Vascular: No hyperdense vessel or unexpected calcification. Skull: No acute calvarial abnormality. Sinuses/Orbits: No acute findings Other: None IMPRESSION: No acute intracranial abnormality. Electronically Signed   By: Rolm Baptise M.D.   On: 06/23/2022 19:04   VAS [place] LOWER EXTREMITY VENOUS (DVT) (7a-7p)  Result Date: 06/23/2022  Lower Venous DVT Study Patient Name:  PRIYANSH PRY  Date of Exam:   06/23/2022 Medical Rec #: [id number]      Accession #:    [id number] Date of Birth: 07/07/1941     Patient Gender: M Patient Age:   25 years Exam Location:  [facility]  Procedure:      VAS [place] LOWER EXTREMITY VENOUS (DVT) Referring Phys: Domenic Moras --------------------------------------------------------------------------------  Indications: Swelling.  Performing Technologist: Rogelia Rohrer RVT, RDMS  Examination Guidelines: A complete evaluation includes B-mode imaging, spectral Doppler, color Doppler, and power Doppler as needed of all accessible portions of each vessel. Bilateral testing is considered an integral part of a complete examination. Limited examinations for reoccurring indications may be performed as noted. The reflux portion of the exam is performed with the patient in reverse Trendelenburg.  +-----+---------------+---------+-----------+----------+--------------+ RIGHTCompressibilityPhasicitySpontaneityPropertiesThrombus Aging +-----+---------------+---------+-----------+----------+--------------+ CFV  Full           Yes      Yes                                 +-----+---------------+---------+-----------+----------+--------------+   +---------+---------------+---------+-----------+----------+------------------+ LEFT     CompressibilityPhasicitySpontaneityPropertiesThrombus Aging     +---------+---------------+---------+-----------+----------+------------------+ CFV      None           No       No                   Acute              +---------+---------------+---------+-----------+----------+------------------+ SFJ      None                                         Acute              +---------+---------------+---------+-----------+----------+------------------+ FV Prox  None           No       No                   Acute              +---------+---------------+---------+-----------+----------+------------------+ FV Mid   None           No       No                   Acute              +---------+---------------+---------+-----------+----------+------------------+ FV DistalNone           No       No                    Acute              +---------+---------------+---------+-----------+----------+------------------+  PFV      None           No       No                   Acute              +---------+---------------+---------+-----------+----------+------------------+ POP      None           No       No                   Acute              +---------+---------------+---------+-----------+----------+------------------+ PTV      None           No       No                   Acute              +---------+---------------+---------+-----------+----------+------------------+ PERO     None           No       No                   Acute              +---------+---------------+---------+-----------+----------+------------------+ Gastroc  None           No       No                   Acute              +---------+---------------+---------+-----------+----------+------------------+ GSV      Full           No       No                                      +---------+---------------+---------+-----------+----------+------------------+ EIV      None           No       No                   Acute - mid &                                                            distal             +---------+---------------+---------+-----------+----------+------------------+    Summary: RIGHT: - No evidence of common femoral vein obstruction.  LEFT: - Findings consistent with acute deep vein thrombosis involving the external illiac vein, left common femoral vein, SF junction, left femoral vein, left proximal profunda vein, left popliteal vein, left posterior tibial veins, left peroneal veins, and left gastrocnemius veins. - No cystic structure found in the popliteal fossa.  *See table(s) above for measurements and observations.    Preliminary    DG Ankle Complete Left  Result Date: 06/23/2022 CLINICAL DATA:  Fall with bruising and swelling to left ankle. EXAM: LEFT ANKLE COMPLETE - 3+ VIEW COMPARISON:  None  Available. FINDINGS: There is no evidence of fracture, dislocation, or joint effusion. Intact talar dome. Moderate Achilles tendon enthesophyte. There is generalized soft  tissue thickening and edema. Vascular calcifications are seen. IMPRESSION: 1. Soft tissue thickening and edema. No acute osseous abnormality. 2. Moderate Achilles tendon enthesophyte. Electronically Signed   By: Keith Rake M.D.   On: 06/23/2022 17:00     Subjective: No acute issues or events overnight, tolerated procedure well denies nausea vomiting diarrhea constipation headache fevers chills or chest pain.  Lower extremity edema pain have markedly improved after thrombectomy.   Discharge Exam: Vitals:   06/25/22 0335 06/25/22 0757  BP: 136/77 135/65  Pulse: 82 94  Resp: 14 16  Temp: 97.7 F (36.5 C) 98.6 F (37 C)  SpO2: 100% 100%   Vitals:   06/24/22 1947 06/24/22 2358 06/25/22 0335 06/25/22 0757  BP: 126/70 129/82 136/77 135/65  Pulse: 75 85 82 94  Resp: 20 18 14 16   Temp: 98.3 F (36.8 C) 98 F (36.7 C) 97.7 F (36.5 C) 98.6 F (37 C)  TempSrc: Oral Oral Oral Oral  SpO2: 100% 100% 100% 100%    General:  Pleasantly resting in bed, No acute distress. HEENT:  Normocephalic atraumatic.  Sclerae nonicteric, noninjected.  Extraocular movements intact bilaterally. Neck:  Without mass or deformity.  Trachea is midline. Lungs:  Clear to auscultate bilaterally without rhonchi, wheeze, or rales. Heart:  Regular rate and rhythm.  Without murmurs, rubs, or gallops. Abdomen:  Soft, nontender, nondistended.  Without guarding or rebound. Extremities: Left lower extremity bandage clean dry intact, mild/cannot pitting edema in foot just distal to wrapping   The results of significant diagnostics from this hospitalization (including imaging, microbiology, ancillary and laboratory) are listed below for reference.     Microbiology: No results found for this or any previous visit (from the past 240 hour(s)).    Labs: BNP (last 3 results) No results for input(s): "BNP" in the last 8760 hours. Basic Metabolic Panel: Recent Labs  Lab 06/23/22 1638 06/23/22 1734 06/24/22 0411 06/25/22 0759  NA 138  --  139 140  K 5.7* 5.4* 4.2 4.8  CL 109  --  111 112*  CO2 24  --  23 22  GLUCOSE 159*  --  95 106*  BUN 73*  --  57* 39*  CREATININE 2.32*  --  1.93* 1.72*  CALCIUM 9.2  --  8.5* 8.5*   Liver Function Tests: No results for input(s): "AST", "ALT", "ALKPHOS", "BILITOT", "PROT", "ALBUMIN" in the last 168 hours. No results for input(s): "LIPASE", "AMYLASE" in the last 168 hours. No results for input(s): "AMMONIA" in the last 168 hours. CBC: Recent Labs  Lab 06/23/22 1638 06/24/22 0010 06/24/22 0411 06/25/22 0759  WBC 9.8  --  6.1 4.9  NEUTROABS 8.6*  --  4.5 3.6  HGB 14.0  --  12.5* 12.8*  HCT 44.3  --  38.4* 39.8  MCV 92.7  --  91.0 92.6  PLT 67* 50* 46* 41*   Cardiac Enzymes: No results for input(s): "CKTOTAL", "CKMB", "CKMBINDEX", "TROPONINI" in the last 168 hours. BNP: Invalid input(s): "POCBNP" CBG: No results for input(s): "GLUCAP" in the last 168 hours. D-Dimer No results for input(s): "DDIMER" in the last 72 hours. Hgb A1c No results for input(s): "HGBA1C" in the last 72 hours. Lipid Profile Recent Labs    06/25/22 0759  CHOL 143  HDL 42  LDLCALC 83  TRIG 88  CHOLHDL 3.4   Thyroid function studies No results for input(s): "TSH", "T4TOTAL", "T3FREE", "THYROIDAB" in the last 72 hours.  Invalid input(s): "FREET3" Anemia work up No results for input(s): "VITAMINB12", "  FOLATE", "FERRITIN", "TIBC", "IRON", "RETICCTPCT" in the last 72 hours. Urinalysis    Component Value Date/Time   COLORURINE YELLOW 09/15/2017 [place] 09/15/2017 1208   LABSPEC 1.010 09/15/2017 1208   PHURINE 7.0 09/15/2017 1208   GLUCOSEU NEGATIVE 09/15/2017 1208   HGBUR NEGATIVE 09/15/2017 [place] 09/15/2017 [place] 09/15/2017 1208    PROTEINUR NEGATIVE 07/10/2016 1100   UROBILINOGEN 0.2 09/15/2017 1208   NITRITE NEGATIVE 09/15/2017 1208   LEUKOCYTESUR SMALL (A) 09/15/2017 1208   Sepsis Labs Recent Labs  Lab 06/23/22 1638 06/24/22 0411 06/25/22 0759  WBC 9.8 6.1 4.9   Microbiology No results found for this or any previous visit (from the past 240 hour(s)).   Time coordinating discharge: Over 30 minutes  SIGNED:   Little Ishikawa, DO [facility] 06/25/2022, 11:35 AM Pager   If 7PM-7AM, please contact night-coverage [email or web address]

## 2022-06-25 NOTE — Discharge Instructions (Signed)
° °  [facility] [place] ° °Discharge Instructions ° °Lower Extremity Angiogram; Angioplasty/Stenting ° °Please refer to the following instructions for your post-procedure care. Your surgeon or physician assistant will discuss any changes with you. ° °Activity ° °Avoid lifting more than 8 pounds (1 gallons of milk) for 72 hours (3 days) after your procedure. You may walk as much as you can tolerate. It's OK to drive after 72 hours. ° °Bathing/Showering ° °You may shower the day after your procedure. If you have a bandage, you may remove it at 24- 48 hours. Clean your incision site with mild soap and water. Pat the area dry with a clean towel. ° °Diet ° °Resume your pre-procedure diet. There are no special food restrictions following this procedure. All patients with peripheral vascular disease should follow a low fat/low cholesterol diet. In order to heal from your surgery, it is CRITICAL to get adequate nutrition. Your body requires vitamins, minerals, and protein. Vegetables are the best source of vitamins and minerals. Vegetables also provide the perfect balance of protein. Processed food has little nutritional value, so try to avoid this. ° °Medications ° °Resume taking all of your medications unless your doctor tells you not to. If your incision is causing pain, you may take over-the-counter pain relievers such as acetaminophen (Tylenol) ° °Follow Up ° °Follow up will be arranged at the time of your procedure. You may have an office visit scheduled or may be scheduled for surgery. Ask your surgeon if you have any questions. ° °Please call us immediately for any of the following conditions: °•Severe or worsening pain your legs or feet at rest or with walking. °•Increased pain, redness, drainage at your groin puncture site. °•Fever of 101 degrees or higher. °•If you have any mild or slow bleeding from your puncture site: lie down, apply firm constant pressure over the area with a piece of  gauze or a clean wash cloth for 30 minutes- no peeking!, call 911 right away if you are still bleeding after 30 minutes, or if the bleeding is heavy and unmanageable. ° °Reduce your risk factors of vascular disease: ° °Stop smoking. If you would like help call [facility] at 1-800-QUIT-NOW (1-800-784-8669) or [place] at 336-586-4000. °Manage your cholesterol °Maintain a desired weight °Control your diabetes °Keep your blood pressure down ° °If you have any questions, please call the office at [phone] ° °

## 2022-06-25 NOTE — Progress Notes (Signed)
PHARMACIST LIPID MONITORING   Scott Oneal is a 81 y.o. male admitted on 06/23/2022 presenting with DVT.  Pharmacy has been consulted to optimize lipid-lowering therapy with the indication of secondary prevention for clinical ASCVD.  Recent Labs:  Lipid Panel (last 6 months):   Lab Results  Component Value Date   CHOL 143 06/25/2022   TRIG 88 06/25/2022   HDL 42 06/25/2022   CHOLHDL 3.4 06/25/2022   VLDL 18 06/25/2022   LDLCALC 83 06/25/2022    Hepatic function panel (last 6 months):   Lab Results  Component Value Date   AST 21 06/15/2022   ALT 29 06/15/2022   ALKPHOS 68 06/15/2022   BILITOT 0.5 06/15/2022   BILIDIR 0.19 06/15/2022    SCr (since admission):   Serum creatinine: 1.72 mg/dL (H) 06/25/22 0759 Estimated creatinine clearance: 37.6 mL/min (A)  Current therapy and lipid therapy tolerance Current lipid-lowering therapy: simvastatin 10 mg  Previous lipid-lowering therapies (if applicable): n/a Documented or reported allergies or intolerances to lipid-lowering therapies (if applicable): n/a  Assessment:   Patient agrees with changes to lipid-lowering therapy  Plan:    1.Statin intensity (high intensity recommended for all patients regardless of the LDL):  Add or increase statin to high intensity. Atorvastatin 80 mg PO daily.   2.Add ezetimibe (if any one of the following):   Not indicated at this time.  3.Refer to lipid clinic:   No  4.Follow-up with:  Primary care provider - Plotnikov, Evie Lacks, MD  5.Follow-up labs after discharge:  Changes in lipid therapy were made. Check a lipid panel in 8-12 weeks then annually.      Gena Fray, PharmD PGY1 Pharmacy Resident   06/25/2022 11:39 AM

## 2022-06-25 NOTE — Evaluation (Signed)
Physical Therapy Evaluation & Discharge Patient Details Name: Scott Oneal MRN: [id number] DOB: May 23, 1941 Today's Date: 06/25/2022  History of Present Illness  81 y/o male presented to ED on 06/23/22 for L LE swelling/warmth. Found to have extensive acute DVT of L tibial, popliteal, femoral, and common femoral external iliac vein. S/p mechanical thrombectomy on 8/25. PMH: Afib, macular degeneration, CKD, HTN  Clinical Impression  Patient admitted with the above. Patient functioning at modI level with no AD and use of IV pole. Able to negotiate stairs to access home environment. No further skilled PT needs identified. No PT follow up recommended at this time. PT will sign off.        Recommendations for follow up therapy are one component of a multi-disciplinary discharge planning process, led by the attending physician.  Recommendations may be updated based on patient status, additional functional criteria and insurance authorization.  Follow Up Recommendations No PT follow up      Assistance Recommended at Discharge None  Patient can return home with the following       Equipment Recommendations None recommended by PT  Recommendations for Other Services       Functional Status Assessment Patient has not had a recent decline in their functional status     Precautions / Restrictions Precautions Precautions: None Restrictions Weight Bearing Restrictions: No      Mobility  Bed Mobility               General bed mobility comments: in recliner on arrival    Transfers Overall transfer level: Modified independent Equipment used: None                    Ambulation/Gait Ambulation/Gait assistance: Modified independent (Device/Increase time) Gait Distance (Feet): 250 Feet Assistive device: IV Pole Gait Pattern/deviations: WFL(Within Functional Limits) Gait velocity: decreased        Stairs Stairs: Yes Stairs assistance: Modified independent  (Device/Increase time) Stair Management: One rail Left, Alternating pattern, Forwards Number of Stairs: 5    Wheelchair Mobility    Modified Rankin (Stroke Patients Only)       Balance Overall balance assessment: Mild deficits observed, not formally tested                                           Pertinent Vitals/Pain Pain Assessment Pain Assessment: No/denies pain    Home Living Family/patient expects to be discharged to:: Private residence Living Arrangements: Spouse/significant other Available Help at Discharge: Family Type of Home: House Home Access: Stairs to enter Entrance Stairs-Rails: Right Entrance Stairs-Number of Steps: 3 Alternate Level Stairs-Number of Steps: 7 (for each level) Home Layout: Multi-level (split level) Home Equipment: None      Prior Function Prior Level of Function : Independent/Modified Independent;Driving             Mobility Comments: very active. Attends exercise classes regularly       Hand Dominance        Extremity/Trunk Assessment   Upper Extremity Assessment Upper Extremity Assessment: Overall WFL for tasks assessed    Lower Extremity Assessment Lower Extremity Assessment: Overall WFL for tasks assessed    Cervical / Trunk Assessment Cervical / Trunk Assessment: Normal  Communication   Communication: No difficulties  Cognition Arousal/Alertness: Awake/alert Behavior During Therapy: WFL for tasks assessed/performed Overall Cognitive Status: Within Functional Limits for tasks assessed  General Comments      Exercises     Assessment/Plan    PT Assessment Patient does not need any further PT services  PT Problem List         PT Treatment Interventions      PT Goals (Current goals can be found in the Care Plan section)  Acute Rehab PT Goals Patient Stated Goal: to go home PT Goal Formulation: All assessment and education  complete, DC therapy    Frequency       Co-evaluation               AM-PAC PT "6 Clicks" Mobility  Outcome Measure Help needed turning from your back to your side while in a flat bed without using bedrails?: None Help needed moving from lying on your back to sitting on the side of a flat bed without using bedrails?: None Help needed moving to and from a bed to a chair (including a wheelchair)?: None Help needed standing up from a chair using your arms (e.g., wheelchair or bedside chair)?: None Help needed to walk in hospital room?: None Help needed climbing 3-5 steps with a railing? : None 6 Click Score: 24    End of Session   Activity Tolerance: Patient tolerated treatment well Patient left: in chair;with call bell/phone within reach Nurse Communication: Mobility status PT Visit Diagnosis: Muscle weakness (generalized) (M62.81)    Time: 0102-7253 PT Time Calculation (min) (ACUTE ONLY): 17 min   Charges:   PT Evaluation $PT Eval Low Complexity: 1 Low          Ibrahim Mcpheeters A. Gilford Rile PT, DPT Acute Rehabilitation Services Office [phone]   Linna Hoff 06/25/2022, 9:54 AM

## 2022-06-25 NOTE — Progress Notes (Addendum)
  Progress Note    06/25/2022 9:11 AM 1 Day Post-Op  Subjective:  L leg much less swollen   Vitals:   06/25/22 0335 06/25/22 0757  BP: 136/77 135/65  Pulse: 82 94  Resp: 14 16  Temp: 97.7 F (36.5 C) 98.6 F (37 C)  SpO2: 100% 100%   Physical Exam: Lungs:  non labored Incisions:  no palpable L popliteal hematoma Extremities:  symmetrical cap refill Neurologic: A&O  CBC    Component Value Date/Time   WBC 4.9 06/25/2022 0759   RBC 4.30 06/25/2022 0759   HGB 12.8 (L) 06/25/2022 0759   HGB 15.6 01/28/2020 1213   HCT 39.8 06/25/2022 0759   HCT 45.7 01/28/2020 1213   PLT 41 (L) 06/25/2022 0759   PLT 72 (LL) 01/28/2020 1213   MCV 92.6 06/25/2022 0759   MCV 88 01/28/2020 1213   MCH 29.8 06/25/2022 0759   MCHC 32.2 06/25/2022 0759   RDW 16.3 (H) 06/25/2022 0759   RDW 15.6 (H) 01/28/2020 1213   LYMPHSABS 1.0 06/25/2022 0759   MONOABS 0.3 06/25/2022 0759   EOSABS 0.0 06/25/2022 0759   BASOSABS 0.0 06/25/2022 0759    BMET    Component Value Date/Time   NA 139 06/24/2022 0411   NA 142 04/01/2020 1024   K 4.2 06/24/2022 0411   CL 111 06/24/2022 0411   CO2 23 06/24/2022 0411   GLUCOSE 95 06/24/2022 0411   BUN 57 (H) 06/24/2022 0411   BUN 20 04/01/2020 1024   CREATININE 1.93 (H) 06/24/2022 0411   CREATININE 2.07 (H) 12/13/2021 0241   CALCIUM 8.5 (L) 06/24/2022 0411   CALCIUM 9.1 07/02/2010 0445   GFRNONAA 35 (L) 06/24/2022 0411   GFRAA 35 (L) 04/01/2020 1024    INR    Component Value Date/Time   INR 1.3 (H) 06/23/2022 1734     Intake/Output Summary (Last 24 hours) at 06/25/2022 0911 Last data filed at 06/25/2022 9741 Gross per 24 hour  Intake 1112.74 ml  Output 800 ml  Net 312.74 ml     Assessment/Plan:  81 y.o. male is s/p L LE venogram with mechanical thrombectomy 1 Day Post-Op   Subjectively, edema of LLE has significantly improved Remove compression wrap, remove suture in the popliteal space, then apply thigh high compression sock Ok to  transition to [facility] for discharge after the above; office will arrange follow up in 4-6 weeks with duplex   Dagoberto Ligas, PA-C [facility] [phone] 06/25/2022 9:11 AM  I agree with the above.  I have seen and evaluated the patient.  He is stable for discharge.  We will remove his sutures in his popliteal space and get him fitted for compression stockings and then he can be discharged.  He will follow-up with Dr. Stanford Breed in a month.  Annamarie Major

## 2022-06-25 NOTE — Progress Notes (Signed)
[place] for Heparin Indication: DVT  Allergies  Allergen Reactions   Biaxin [Clarithromycin]     Dizziness   Tizanidine     Felt funny   Allantoin-Pramoxine Other (See Comments)    Pt does't remember reaction   Amlodipine Swelling    Edema    Amoxicillin Rash    Rash on arms that developed towards end of 7 day treatment   Benzalkonium Chloride Itching and Rash   Doxycycline Photosensitivity and Rash   Metoprolol Tartrate Other (See Comments)    REACTION: fatigue    Patient Measurements:   Heparin Dosing Weight: 84.4 kg  Vital Signs: Temp: 98.6 F (37 C) (08/26 0757) Temp Source: Oral (08/26 0757) BP: 135/65 (08/26 0757) Pulse Rate: 94 (08/26 0757)  Labs: Recent Labs    06/23/22 1638 06/23/22 1734 06/24/22 0010 06/24/22 0411 06/24/22 2228 06/25/22 0759  HGB 14.0  --   --  12.5*  --  12.8*  HCT 44.3  --   --  38.4*  --  39.8  PLT 67*  --  50* 46*  --  41*  LABPROT  --  15.7*  --   --   --   --   INR  --  1.3*  --   --   --   --   HEPARINUNFRC  --   --   --  0.85* 0.23* 0.47  CREATININE 2.32*  --   --  1.93*  --   --     Estimated Creatinine Clearance: 33.5 mL/min (A) (by C-G formula based on SCr of 1.93 mg/dL (H)).   Medical History: Past Medical History:  Diagnosis Date   Allergy    Arthritis    Calculus of gallbladder without mention of cholecystitis    Cataract    bilateral cateracts removed   Celiac disease    Chronic kidney disease    stage 3   Coronary atherosclerosis of unspecified type of vessel, native or graft    Dermatitis 06/08/2021   Erythema of lower extremity 09/08/2021   Esophageal reflux    Glaucoma    History of cellulitis 09/08/2021   History of kidney stones    Loss of weight    Lower extremity weakness 05/31/2021   Macular degeneration    Rt eye . injections   Old myocardial infarction    Osteoporosis, unspecified    Other malaise and fatigue    Other specified cardiac  dysrhythmias(427.89)    Other testicular hypofunction    Pancreatic insufficiency 05/31/2021   Personal history of colonic polyps    Postural dizziness with presyncope 12/13/2021   Pure hypercholesterolemia    Thrombocytopenia ([place])    chronic, per PCP notes dating back to 2009   Tinea pedis 05/31/2021   Unspecified asthma(493.90)    Unspecified essential hypertension    Unspecified hypothyroidism    Venous stasis dermatitis 09/08/2021    Medications:  Medications Prior to Admission  Medication Sig Dispense Refill Last Dose   acetaminophen (TYLENOL) 500 MG tablet Take 1 tablet (500 mg total) by mouth every 6 (six) hours as needed for up to 30 doses. 30 tablet 0 unknown   calcitRIOL (ROCALTROL) 0.25 MCG capsule TAKE 1 CAPSULE BY MOUTH EVERY DAY 90 capsule 1 06/23/2022   carvedilol (COREG) 25 MG tablet Take 12.5 mg by mouth 2 (two) times daily with a meal.   06/23/2022 at 0600   cetirizine (ZYRTEC) 10 MG tablet Take 10 mg by mouth daily as  needed for allergies.   Past Week   Cholecalciferol 2000 UNITS TABS Take 2,000 Units by mouth daily.   06/23/2022   clonazePAM (KLONOPIN) 0.25 MG disintegrating tablet TAKE 1 TABLET (0.25 MG TOTAL) BY MOUTH 2 (TWO) TIMES DAILY AS NEEDED (ANXIETY). 60 tablet 3 unknown   diclofenac sodium (VOLTAREN) 1 % GEL APPLY 4 G TOPICALLY 4 TIMES DAILY. (Patient taking differently: Apply 4 g topically 4 (four) times daily as needed (pain).) 200 g 5 unknown   hydrALAZINE (APRESOLINE) 10 MG tablet Take 1-2 tablet 3 times a day as needed if blood pressure is >170 60 tablet 3 unknown   isosorbide mononitrate (IMDUR) 30 MG 24 hr tablet Take 1 tablet (30 mg total) by mouth daily. 90 tablet 2 06/22/2022   levothyroxine (SYNTHROID) 175 MCG tablet TAKE 1 TABLET BY MOUTH EVERY DAY 90 tablet 3 06/22/2022   lipase/protease/amylase (CREON) 36000 UNITS CPEP capsule Take 2 capsules (72,000 Units total) by mouth 3 (three) times daily with meals. May also take 1 capsule (36,000 Units total)  as needed (with snacks). 240 capsule 11 06/23/2022   Multiple Vitamins-Minerals (PRESERVISION AREDS 2) CAPS Take 1 capsule by mouth in the morning and at bedtime.   06/23/2022   mupirocin ointment (BACTROBAN) 2 % Apply 1 application topically 2 (two) times daily. Apply to wounds, cuts to prevent infection (Patient taking differently: Apply 1 application  topically daily as needed (cellulitis flare).) 22 g 0 unknown   olmesartan (BENICAR) 5 MG tablet TAKE 1 TABLET BY MOUTH EVERY DAY 90 tablet 0 06/23/2022   pantoprazole (PROTONIX) 40 MG tablet Take 1 tablet (40 mg total) by mouth 2 (two) times daily. (Patient taking differently: Take 40 mg by mouth daily.) 60 tablet 5 06/23/2022   predniSONE (DELTASONE) 10 MG tablet Take 10 mg by mouth daily with breakfast.   06/23/2022   Propylene Glycol (SYSTANE BALANCE) 0.6 % SOLN Place 1 drop into both eyes daily as needed (Dry eye).   unknown   simvastatin (ZOCOR) 10 MG tablet TAKE 1 TABLET BY MOUTH EVERYDAY AT BEDTIME 90 tablet 3 06/22/2022   tamsulosin (FLOMAX) 0.4 MG CAPS capsule Take 0.4 mg by mouth daily.   06/23/2022   testosterone cypionate (DEPOTESTOSTERONE CYPIONATE) 200 MG/ML injection INJECT 2ML INTO MUSCLE EVERY 2 WEEKS 12 mL 1 Past Month   nitroGLYCERIN (NITROSTAT) 0.4 MG SL tablet Place 0.4 mg under the tongue every 5 (five) minutes as needed for chest pain.   never used   SYRINGE-NEEDLE, DISP, 3 ML (BD ECLIPSE SYRINGE) 23G X 1" 3 ML MISC As directed IM 50 each 2       Assessment: 51 yom with a history of CAD s/p DES, HF, CKD, HTN, HLD, chronic venous stasis. Patient is presenting with leg swelling. [place] w/ acute DVT in LLE. No known anticoagulation PTA. Heparin per pharmacy consult placed for DVT.  Heparin level 0.47 on drip rate 1250 units/hr therapeutic. Last heparin level low after re-start s/p OR. Hgb 12.8 and plt 41. Patient has PMH of thrombocytopenia - MD aware. No s/sx bleeding noted in chart.    Goal of Therapy:  Heparin level 0.3-0.7  units/ml Monitor platelets by anticoagulation protocol: Yes   Plan:  Continue heparin infusion at 1250 units/hr  Will f/u heparin level in 8 hours Monitor daily heparin level and CBC Continue to monitor H&H     Gena Fray, PharmD PGY1 Pharmacy Resident   06/25/2022 8:53 AM

## 2022-06-27 ENCOUNTER — Telehealth: Payer: Self-pay

## 2022-06-27 ENCOUNTER — Encounter (HOSPITAL_COMMUNITY): Payer: Self-pay | Admitting: Vascular Surgery

## 2022-06-27 ENCOUNTER — Telehealth (HOSPITAL_COMMUNITY): Payer: Self-pay | Admitting: Cardiovascular Disease

## 2022-06-27 NOTE — Telephone Encounter (Signed)
Transition Care Management Unsuccessful Follow-up Telephone Call  Date of discharge and from where:  [place] 06-25-22 Dx: DVT  Attempts:  1st Attempt  Reason for unsuccessful TCM follow-up call:  Left voice message  Transition Care Management Follow-up Telephone Call Date of discharge and from where: [place] 06-25-22 Dx: DVT How have you been since you were released from the hospital? Feeling ok  Any questions or concerns? No  Items Reviewed: Did the pt receive and understand the discharge instructions provided? Yes  Medications obtained and verified? Yes  Other? No  Any new allergies since your discharge? No  Dietary orders reviewed? Yes Do you have support at home? Yes   Home Care and Equipment/Supplies: Were home health services ordered? no If so, what is the name of the agency? na  Has the agency set up a time to come to the patient's home? not applicable Were any new equipment or medical supplies ordered?  No What is the name of the medical supply agency? na Were you able to get the supplies/equipment? not applicable Do you have any questions related to the use of the equipment or supplies? No  Functional Questionnaire: (I = Independent and D = Dependent) ADLs: I  Bathing/Dressing- I  Meal Prep- I  Eating- I  Maintaining continence- I  Transferring/Ambulation- I  Managing Meds- I  Follow up appointments reviewed:  PCP Hospital f/u appt confirmed? Yes  Scheduled to see [name] on 06-29-22 @ [facility] f/u appt confirmed? No . Are transportation arrangements needed? No  If their condition worsens, is the pt aware to call PCP or go to the Emergency Dept.? Yes Was the patient provided with contact information for the PCP's office or ED? Yes Was to pt encouraged to call back with questions or concerns? Yes

## 2022-06-27 NOTE — Telephone Encounter (Signed)
Pt called stating that he could not find in any d/c paperwork how long to wear his compression stockings.  Reviewed pt's chart, returned call for clarification, two identifiers used. Informed pt that he should continue to wear them every day, take them off for night and shower. Confirmed understanding.

## 2022-06-27 NOTE — Telephone Encounter (Signed)
Patient   Pt c/o medication issue:  1. Name of Medication:   simvastatin (ZOCOR) tablet 10 mg    vasopressin (PITRESSIN) 20 UNIT/ML injection  2. How are you currently taking this medication (dosage and times per day)? As prescribed  3. Are you having a reaction (difficulty breathing--STAT)?   No  4. What is your medication issue?    Patient stated he wanted to report that his hospital doctor replaced these medications with  atorvastatin (LIPITOR) 80 MG tablet.

## 2022-06-28 NOTE — Telephone Encounter (Signed)
Returned call to patient to let him know that his MAR does show the Atorvastatin and no simvastatin. He also states he was taken off his olmesartan, which is no longer in the [facility]. He wasn't sure if he needed to let us know. Informed that it's changed in his chart upon discharge.

## 2022-06-29 ENCOUNTER — Ambulatory Visit (INDEPENDENT_AMBULATORY_CARE_PROVIDER_SITE_OTHER): Payer: PPO | Admitting: Internal Medicine

## 2022-06-29 ENCOUNTER — Encounter: Payer: Self-pay | Admitting: Internal Medicine

## 2022-06-29 DIAGNOSIS — E034 Atrophy of thyroid (acquired): Secondary | ICD-10-CM | POA: Diagnosis not present

## 2022-06-29 DIAGNOSIS — R609 Edema, unspecified: Secondary | ICD-10-CM | POA: Diagnosis not present

## 2022-06-29 DIAGNOSIS — M353 Polymyalgia rheumatica: Secondary | ICD-10-CM | POA: Diagnosis not present

## 2022-06-29 DIAGNOSIS — Z7901 Long term (current) use of anticoagulants: Secondary | ICD-10-CM | POA: Diagnosis not present

## 2022-06-29 DIAGNOSIS — I82422 Acute embolism and thrombosis of left iliac vein: Secondary | ICD-10-CM

## 2022-06-29 MED ORDER — TRAMADOL HCL 50 MG PO TABS: 50.0000 mg | ORAL_TABLET | Freq: Four times a day (QID) | ORAL | 2 refills | Status: AC | PRN

## 2022-06-29 NOTE — Assessment & Plan Note (Signed)
Much better Prednisone 20 mg/d pc

## 2022-06-29 NOTE — Progress Notes (Signed)
Subjective:  Patient ID: Scott Oneal, male    DOB: Dec 22, 1940  Age: 81 y.o. MRN: [id number]  CC: Follow-up [facility] f/u)   HPI Scott Oneal presents for LLE DVT f/u of recent hospital stay and endovascular procedure for posttraumatic blood clot.  He is back home.  Per hx: "Admit date: 06/23/2022 Discharge date: 06/25/2022   Admitted From: Home Disposition: Home   Recommendations for Outpatient Follow-up:  Follow up with PCP in 1-2 weeks Follow-up with vascular surgery as scheduled   Home Health: None Equipment/Devices: None   Discharge Condition: Stable CODE STATUS: Full Diet recommendation: Low-salt low-fat diet   Brief/Interim Summary: Scott Oneal is a 81 y.o. male with medical history significant of CAD s/p DES, combined systolic and diastolic heart failure, and CKD stage IV, hypertension hyperlipidemia, chronic venous stasis changes who presents with increase left lower extremity swelling. Found to have extensive DVT in LLE - Vascular consulted. Hospitalist called to admit.   Patient admitted as above with acute symptomatic DVT, provoked in the setting of recent trauma, given found extension of DVT patient was evaluated by vascular surgery who completed thrombectomy on 06/24/2022.  Procedure was tolerated quite well otherwise transitioning off of IV heparin to p.o. Eliquis per discussion and stable for discharge.  Postop follow-up in vascular clinic as scheduled.  Follow-up with PCP in the next 1 to 2 weeks for repeat labs and to ensure appropriate tolerance of anticoagulation in setting of chronic thrombocytopenia.  Patient's medications otherwise as below, discontinue olmesartan in the setting of recent AKI with improving blood pressure, unclear if this medication will need to be restarted in the near future, defer to PCP.  Also encourage patient to discuss testosterone replacement with PCP given risk factor of DVT, again given its provoked status in the setting of trauma it may  be reasonable to resume this medication however full risks and benefits should be discussed with primary team.   Patient's other chronic comorbid conditions as below remained stable.   Discharge Diagnoses:  Principal Problem:   DVT (deep venous thrombosis) (HCC) Active Problems:   Hypothyroidism   Hypogonadism in male   Essential hypertension   Hyperkalemia   Thrombocytopenia (HCC)   Acute on chronic kidney failure (HCC)   CAD S/P percutaneous coronary angioplasty   Arthritis       Discharge Instructions     Allergies as of 06/25/2022         Reactions    Biaxin [clarithromycin]      Dizziness    Tizanidine      Felt funny    Allantoin-pramoxine Other (See Comments)    Pt does't remember reaction    Amlodipine Swelling    Edema     Amoxicillin Rash    Rash on arms that developed towards end of 7 day treatment    Benzalkonium Chloride Itching, Rash    Doxycycline Photosensitivity, Rash    Metoprolol Tartrate Other (See Comments)    REACTION: fatigue            Medication List       STOP taking these medications     olmesartan 5 MG tablet Commonly known as: BENICAR           TAKE these medications      Outpatient Medications Prior to Visit  Medication Sig Dispense Refill   acetaminophen (TYLENOL) 500 MG tablet Take 1 tablet (500 mg total) by mouth every 6 (six) hours as needed for up to  30 doses. 30 tablet 0   apixaban (ELIQUIS) 5 MG TABS tablet Take 2 tablets (10 mg total) by mouth 2 (two) times daily for 7 days, THEN 1 tablet (5 mg total) 2 (two) times daily. 88 tablet 0   atorvastatin (LIPITOR) 80 MG tablet Take 1 tablet (80 mg total) by mouth at bedtime. 30 tablet 11   calcitRIOL (ROCALTROL) 0.25 MCG capsule TAKE 1 CAPSULE BY MOUTH EVERY DAY 90 capsule 1   carvedilol (COREG) 25 MG tablet Take 12.5 mg by mouth 2 (two) times daily with a meal.     cetirizine (ZYRTEC) 10 MG tablet Take 10 mg by mouth daily as needed for allergies.     Cholecalciferol  2000 UNITS TABS Take 2,000 Units by mouth daily.     clonazePAM (KLONOPIN) 0.25 MG disintegrating tablet TAKE 1 TABLET (0.25 MG TOTAL) BY MOUTH 2 (TWO) TIMES DAILY AS NEEDED (ANXIETY). 60 tablet 3   diclofenac sodium (VOLTAREN) 1 % GEL APPLY 4 G TOPICALLY 4 TIMES DAILY. (Patient taking differently: Apply 4 g topically 4 (four) times daily as needed (pain).) 200 g 5   hydrALAZINE (APRESOLINE) 10 MG tablet Take 1-2 tablet 3 times a day as needed if blood pressure is >170 60 tablet 3   isosorbide mononitrate (IMDUR) 30 MG 24 hr tablet Take 1 tablet (30 mg total) by mouth daily. 90 tablet 2   levothyroxine (SYNTHROID) 175 MCG tablet TAKE 1 TABLET BY MOUTH EVERY DAY 90 tablet 3   lipase/protease/amylase (CREON) 36000 UNITS CPEP capsule Take 2 capsules (72,000 Units total) by mouth 3 (three) times daily with meals. May also take 1 capsule (36,000 Units total) as needed (with snacks). 240 capsule 11   Multiple Vitamins-Minerals (PRESERVISION AREDS 2) CAPS Take 1 capsule by mouth in the morning and at bedtime.     nitroGLYCERIN (NITROSTAT) 0.4 MG SL tablet Place 0.4 mg under the tongue every 5 (five) minutes as needed for chest pain.     pantoprazole (PROTONIX) 40 MG tablet Take 1 tablet (40 mg total) by mouth 2 (two) times daily. (Patient taking differently: Take 40 mg by mouth daily.) 60 tablet 5   predniSONE (DELTASONE) 10 MG tablet Take 20 mg by mouth daily with breakfast.     Propylene Glycol (SYSTANE BALANCE) 0.6 % SOLN Place 1 drop into both eyes daily as needed (Dry eye).     SYRINGE-NEEDLE, DISP, 3 ML (BD ECLIPSE SYRINGE) 23G X 1" 3 ML MISC As directed IM 50 each 2   tamsulosin (FLOMAX) 0.4 MG CAPS capsule Take 0.4 mg by mouth daily.     testosterone cypionate (DEPOTESTOSTERONE CYPIONATE) 200 MG/ML injection INJECT 2ML INTO MUSCLE EVERY 2 WEEKS 12 mL 1   Aspirin 81 MG CAPS Take 1 capsule by mouth daily.     mupirocin ointment (BACTROBAN) 2 % Apply 1 application topically 2 (two) times daily. Apply  to wounds, cuts to prevent infection (Patient not taking: Reported on 06/29/2022) 22 g 0   No facility-administered medications prior to visit.    ROS: Review of Systems  Constitutional:  Negative for appetite change, fatigue, fever and unexpected weight change.  HENT:  Negative for congestion, nosebleeds, sneezing, sore throat and trouble swallowing.   Eyes:  Negative for itching and visual disturbance.  Respiratory:  Negative for cough.   Cardiovascular:  Positive for leg swelling. Negative for chest pain and palpitations.  Gastrointestinal:  Negative for abdominal distention, blood in stool, diarrhea and nausea.  Genitourinary:  Negative for frequency and  hematuria.  Musculoskeletal:  Positive for gait problem. Negative for back pain, joint swelling and neck pain.  Skin:  Negative for rash.  Neurological:  Negative for dizziness, tremors, speech difficulty and weakness.  Psychiatric/Behavioral:  Negative for agitation, dysphoric mood and sleep disturbance. The patient is not nervous/anxious.     Objective:  BP 118/60 (BP Location: Left Arm)   Pulse 95   Temp 98.6 F (37 C) (Oral)   Ht 6' (1.829 m)   Wt 188 lb 9.6 oz (85.5 kg)   SpO2 97%   BMI 25.58 kg/m   BP Readings from Last 3 Encounters:  06/29/22 118/60  06/25/22 135/65  06/23/22 (!) 140/80    Wt Readings from Last 3 Encounters:  06/29/22 188 lb 9.6 oz (85.5 kg)  06/23/22 186 lb (84.4 kg)  06/14/22 180 lb (81.6 kg)    Physical Exam Constitutional:      General: He is not in acute distress.    Appearance: Normal appearance. He is well-developed.     Comments: NAD  Eyes:     Conjunctiva/sclera: Conjunctivae normal.     Pupils: Pupils are equal, round, and reactive to light.  Neck:     Thyroid: No thyromegaly.     Vascular: No JVD.  Cardiovascular:     Rate and Rhythm: Normal rate and regular rhythm.     Heart sounds: Normal heart sounds. No murmur heard.    No friction rub. No gallop.  Pulmonary:      Effort: Pulmonary effort is normal. No respiratory distress.     Breath sounds: Normal breath sounds. No wheezing or rales.  Chest:     Chest wall: No tenderness.  Abdominal:     General: Bowel sounds are normal. There is no distension.     Palpations: Abdomen is soft. There is no mass.     Tenderness: There is no abdominal tenderness. There is no guarding or rebound.  Musculoskeletal:        General: Swelling present. No tenderness. Normal range of motion.     Cervical back: Normal range of motion.  Lymphadenopathy:     Cervical: No cervical adenopathy.  Skin:    General: Skin is warm and dry.     Findings: No rash.  Neurological:     Mental Status: He is alert and oriented to person, place, and time.     Cranial Nerves: No cranial nerve deficit.     Motor: No abnormal muscle tone.     Coordination: Coordination normal.     Gait: Gait normal.     Deep Tendon Reflexes: Reflexes are normal and symmetric.  Psychiatric:        Behavior: Behavior normal.        Thought Content: Thought content normal.        Judgment: Judgment normal.   Left lower extremity with 1+ swelling, nontender  Lab Results  Component Value Date   WBC 4.9 06/25/2022   HGB 12.8 (L) 06/25/2022   HCT 39.8 06/25/2022   PLT 41 (L) 06/25/2022   GLUCOSE 106 (H) 06/25/2022   CHOL 143 06/25/2022   TRIG 88 06/25/2022   HDL 42 06/25/2022   LDLCALC 83 06/25/2022   ALT 29 06/15/2022   AST 21 06/15/2022   NA 140 06/25/2022   K 4.8 06/25/2022   CL 112 (H) 06/25/2022   CREATININE 1.72 (H) 06/25/2022   BUN 39 (H) 06/25/2022   CO2 22 06/25/2022   TSH 11.04 (H) 04/15/2021   PSA  2.08 08/24/2020   INR 1.3 (H) 06/23/2022   HGBA1C 6.0 12/28/2021   MICROALBUR 1.2 05/18/2017    VAS [place] LOWER EXTREMITY VENOUS (DVT) (7a-7p)  Result Date: 06/25/2022  Lower Venous DVT Study Patient Name:  Scott Oneal  Date of Exam:   06/23/2022 Medical Rec #: [id number]      Accession #:    [id number] Date of Birth: [birth year]      Patient Gender: M Patient Age:   21 years Exam Location[facility]  [facility] Procedure:      VAS [place] LOWER EXTREMITY VENOUS (DVT) Referring Phys: Domenic Moras --------------------------------------------------------------------------------  Indications: Swelling. Other Indications: [place] one week ago and has had increased swelling over past                    few days. Comparison Study: Previous exam on 07/26/2016 was negative for DVT, was positive                   for SVT in SSV Performing Technologist: Rogelia Rohrer RVT, RDMS  Examination Guidelines: A complete evaluation includes B-mode imaging, spectral Doppler, color Doppler, and power Doppler as needed of all accessible portions of each vessel. Bilateral testing is considered an integral part of a complete examination. Limited examinations for reoccurring indications may be performed as noted. The reflux portion of the exam is performed with the patient in reverse Trendelenburg.  +-----+---------------+---------+-----------+----------+--------------+ RIGHTCompressibilityPhasicitySpontaneityPropertiesThrombus Aging +-----+---------------+---------+-----------+----------+--------------+ CFV  Full           Yes      Yes                                 +-----+---------------+---------+-----------+----------+--------------+   +---------+---------------+---------+-----------+----------+------------------+ LEFT     CompressibilityPhasicitySpontaneityPropertiesThrombus Aging     +---------+---------------+---------+-----------+----------+------------------+ CFV      None           No       No                   Acute              +---------+---------------+---------+-----------+----------+------------------+ SFJ      None                                         Acute              +---------+---------------+---------+-----------+----------+------------------+ FV Prox  None           No       No                   Acute               +---------+---------------+---------+-----------+----------+------------------+ FV Mid   None           No       No                   Acute              +---------+---------------+---------+-----------+----------+------------------+ FV DistalNone           No       No                   Acute              +---------+---------------+---------+-----------+----------+------------------+  PFV      None           No       No                   Acute              +---------+---------------+---------+-----------+----------+------------------+ POP      None           No       No                   Acute              +---------+---------------+---------+-----------+----------+------------------+ PTV      None           No       No                   Acute              +---------+---------------+---------+-----------+----------+------------------+ PERO     None           No       No                   Acute              +---------+---------------+---------+-----------+----------+------------------+ Gastroc  None           No       No                   Acute              +---------+---------------+---------+-----------+----------+------------------+ GSV      Full           No       No                                      +---------+---------------+---------+-----------+----------+------------------+ EIV      None           No       No                   Acute - mid &                                                            distal             +---------+---------------+---------+-----------+----------+------------------+     Summary: RIGHT: - No evidence of common femoral vein obstruction.  LEFT: - Findings consistent with acute deep vein thrombosis involving the external illiac vein, left common femoral vein, SF junction, left femoral vein, left proximal profunda vein, left popliteal vein, left posterior tibial veins, left peroneal veins, and left gastrocnemius  veins. - No cystic structure found in the popliteal fossa.  *See table(s) above for measurements and observations. Electronically signed by Harold Barban MD on 06/25/2022 at 4:12:32 PM.    Final    PERIPHERAL VASCULAR CATHETERIZATION  Result Date: 06/24/2022 DATE OF SERVICE: 06/24/2022  PATIENT:  Scott Oneal  81 y.o. male  PRE-OPERATIVE DIAGNOSIS:  left iliofemoral deep venous thrombosis  POST-OPERATIVE DIAGNOSIS:  Same  PROCEDURE:  1) ultrasound-guided  left popliteal vein access 2) intravascular ultrasound of inferior vena cava, left common iliac vein, left external iliac vein, left common femoral vein, left femoral vein. 3) mechanical thrombectomy of left common iliac vein, left external iliac vein, left common femoral vein, left femoral vein. 4) left lower extremity and central venogram (10 mL total contrast) 5) conscious sedation (35 minutes)  SURGEON:  Surgeon(s) and Role:    * Hawken, Yevonne Aline, MD - Primary  ASSISTANT: None  ANESTHESIA:   local and IV sedation  EBL: minimal  BLOOD ADMINISTERED:none  DRAINS: none  LOCAL MEDICATIONS USED:  LIDOCAINE  SPECIMEN:  none  COUNTS: confirmed correct.  TOURNIQUET:  none  PATIENT DISPOSITION:  PACU - hemodynamically stable.  Delay start of Pharmacological VTE agent (>24hrs) due to surgical blood loss or risk of bleeding: no  INDICATION FOR PROCEDURE: Scott Oneal is a 81 y.o. male with left iliofemoral deep venous thrombosis which is highly symptomatic. After careful discussion of risks, benefits, and alternatives the patient was offered venogram and possible thrombectomy. The patient understood and wished to proceed.  OPERATIVE FINDINGS: Unremarkable access.  Intravascular ultrasound shows left external iliac, common femoral, femoral vein deep venous thrombosis which is occlusive.  Good technical result achieved with thrombectomy.  Repeat intravascular ultrasound shows resolution of iliac and common femoral thrombosis.  Ascending venogram also demonstrates lack of  thrombus.  2-0 nylon bolstered suture put around the access with good hemostasis.  DESCRIPTION OF PROCEDURE: After identification of the patient in the pre-operative holding area, the patient was transferred to the operating room. The patient was positioned supine on the operating room table. Anesthesia was induced. The left popliteal fossa was prepped and draped in standard fashion. A surgical pause was performed confirming correct patient, procedure, and operative location.  Using intraoperative ultrasound, the left popliteal vein was accessed with micropuncture technique.  Access was upsized to 8 [place].  A Glidewire advantage was navigated into the right internal jugular vein.  Over the wire and intravascular ultrasound was performed evaluating the inferior vena cava, left common iliac vein, left external iliac vein, left common femoral vein, left femoral vein.  Occlusive thrombus was noted as above.  An Inari device was prepped per manufacturer's instructions.  The 8 French sheath was removed and the 13 [place] Inari sheath was delivered into the popliteal vein.  4 passes of the Inari clot retriever were performed.  Good result was noted on each pass, with a significant burden of thrombus cleared in our catheter.  After the fourth pass I reevaluated the veins with intravascular ultrasound.  Near resolution of the thrombus was noted.  Introduced a Berenstein catheter over the wire to the central femoral vein and performed ascending venogram.  This reconfirmed the intravascular ultrasound findings.  Satisfied we ended the case here.  All endovascular Teresa Coombs was removed.  A figure-of-eight 2-0 nylon stitch was placed around her access site and a bolster placed under the suture to help with hemostasis.  The suture was secured.  A sterile bandage was applied.  Upon completion of the case instrument and sharps counts were confirmed correct. The patient was transferred to the PACU in good condition. I was present for  all portions of the procedure.  Yevonne Aline. Stanford Breed, MD [facility] Phone Number: [phone] 06/24/2022 1:24 PM   CT Head Wo Contrast  Result Date: 06/23/2022 CLINICAL DATA:  Head trauma, minor (Age >= 65y) EXAM: CT HEAD WITHOUT CONTRAST TECHNIQUE: Contiguous axial  images were obtained from the base of the skull through the vertex without intravenous contrast. RADIATION DOSE REDUCTION: This exam was performed according to the departmental dose-optimization program which includes automated exposure control, adjustment of the mA and/or kV according to patient size and/or use of iterative reconstruction technique. COMPARISON:  None Available. FINDINGS: Brain: No acute intracranial abnormality. Specifically, no hemorrhage, hydrocephalus, mass lesion, acute infarction, or significant intracranial injury. Vascular: No hyperdense vessel or unexpected calcification. Skull: No acute calvarial abnormality. Sinuses/Orbits: No acute findings Other: None IMPRESSION: No acute intracranial abnormality. Electronically Signed   By: Rolm Baptise M.D.   On: 06/23/2022 19:04   DG Ankle Complete Left  Result Date: 06/23/2022 CLINICAL DATA:  Fall with bruising and swelling to left ankle. EXAM: LEFT ANKLE COMPLETE - 3+ VIEW COMPARISON:  None Available. FINDINGS: There is no evidence of fracture, dislocation, or joint effusion. Intact talar dome. Moderate Achilles tendon enthesophyte. There is generalized soft tissue thickening and edema. Vascular calcifications are seen. IMPRESSION: 1. Soft tissue thickening and edema. No acute osseous abnormality. 2. Moderate Achilles tendon enthesophyte. Electronically Signed   By: Keith Rake M.D.   On: 06/23/2022 17:00    Assessment & Plan:   Problem List Items Addressed This Visit     Anticoagulant long-term use    On p.o. Eliquis 8/23 for LLE DVT D/c ASA Compression sock      DVT (deep venous thrombosis) ([place])    Patient was admitted  as above with acute symptomatic DVT, provoked in the setting of recent trauma, given found extension of DVT patient was evaluated by vascular surgery who completed thrombectomy on 06/24/2022.  Procedure was tolerated quite well otherwise transitioning off of IV heparin to p.o. Eliquis  Better D/c ASA Compression sock      Edema    Better      Relevant Orders   CBC with Differential/Platelet   Comprehensive metabolic panel   Urinalysis   Hypothyroidism    Chronic Cont on Levothroid      Relevant Orders   CBC with Differential/Platelet   Comprehensive metabolic panel   Urinalysis   PMR (polymyalgia rheumatica) (HCC)    Much better Prednisone 20 mg/d pc      Relevant Orders   CBC with Differential/Platelet      Meds ordered this encounter  Medications   traMADol (ULTRAM) 50 MG tablet    Sig: Take 1 tablet (50 mg total) by mouth every 6 (six) hours as needed for up to 5 days.    Dispense:  120 tablet    Refill:  2      Follow-up: Return in about 6 weeks (around 08/10/2022) for a follow-up visit.  Walker Kehr, MD

## 2022-06-29 NOTE — Assessment & Plan Note (Signed)
On p.o. Eliquis 8/23 for LLE DVT D/c ASA Compression sock

## 2022-06-29 NOTE — Assessment & Plan Note (Signed)
Patient was admitted as above with acute symptomatic DVT, provoked in the setting of recent trauma, given found extension of DVT patient was evaluated by vascular surgery who completed thrombectomy on 06/24/2022.  Procedure was tolerated quite well otherwise transitioning off of IV heparin to p.o. Eliquis  Better D/c ASA Compression sock

## 2022-06-29 NOTE — Assessment & Plan Note (Signed)
Better  

## 2022-06-29 NOTE — Assessment & Plan Note (Signed)
Chronic Cont on Levothroid

## 2022-06-30 DIAGNOSIS — Z7901 Long term (current) use of anticoagulants: Secondary | ICD-10-CM | POA: Diagnosis not present

## 2022-06-30 DIAGNOSIS — Z09 Encounter for follow-up examination after completed treatment for conditions other than malignant neoplasm: Secondary | ICD-10-CM | POA: Diagnosis not present

## 2022-06-30 DIAGNOSIS — Z86718 Personal history of other venous thrombosis and embolism: Secondary | ICD-10-CM | POA: Diagnosis not present

## 2022-07-05 ENCOUNTER — Other Ambulatory Visit (INDEPENDENT_AMBULATORY_CARE_PROVIDER_SITE_OTHER): Payer: PPO

## 2022-07-05 DIAGNOSIS — E291 Testicular hypofunction: Secondary | ICD-10-CM | POA: Diagnosis not present

## 2022-07-05 DIAGNOSIS — M353 Polymyalgia rheumatica: Secondary | ICD-10-CM | POA: Diagnosis not present

## 2022-07-05 DIAGNOSIS — E034 Atrophy of thyroid (acquired): Secondary | ICD-10-CM | POA: Diagnosis not present

## 2022-07-05 DIAGNOSIS — R609 Edema, unspecified: Secondary | ICD-10-CM

## 2022-07-05 LAB — CBC WITH DIFFERENTIAL/PLATELET
Basophils Absolute: 0 10*3/uL (ref 0.0–0.1)
Basophils Relative: 0.4 % (ref 0.0–3.0)
Eosinophils Absolute: 0 10*3/uL (ref 0.0–0.7)
Eosinophils Relative: 0.6 % (ref 0.0–5.0)
HCT: 41.6 % (ref 39.0–52.0)
Hemoglobin: 13.5 g/dL (ref 13.0–17.0)
Lymphocytes Relative: 7.5 % — ABNORMAL LOW (ref 12.0–46.0)
Lymphs Abs: 0.5 10*3/uL — ABNORMAL LOW (ref 0.7–4.0)
MCHC: 32.6 g/dL (ref 30.0–36.0)
MCV: 90.7 fl (ref 78.0–100.0)
Monocytes Absolute: 0.4 10*3/uL (ref 0.1–1.0)
Monocytes Relative: 5.3 % (ref 3.0–12.0)
Neutro Abs: 5.8 10*3/uL (ref 1.4–7.7)
Neutrophils Relative %: 86.2 % — ABNORMAL HIGH (ref 43.0–77.0)
Platelets: 87 10*3/uL — ABNORMAL LOW (ref 150.0–400.0)
RBC: 4.58 Mil/uL (ref 4.22–5.81)
RDW: 17.6 % — ABNORMAL HIGH (ref 11.5–15.5)
WBC: 6.7 10*3/uL (ref 4.0–10.5)

## 2022-07-05 LAB — URINALYSIS, ROUTINE W REFLEX MICROSCOPIC
Bilirubin Urine: NEGATIVE
Nitrite: NEGATIVE
Specific Gravity, Urine: 1.02 (ref 1.000–1.030)
Total Protein, Urine: NEGATIVE
Urine Glucose: NEGATIVE
Urobilinogen, UA: 0.2 (ref 0.0–1.0)
pH: 5.5 (ref 5.0–8.0)

## 2022-07-05 LAB — COMPREHENSIVE METABOLIC PANEL
ALT: 21 U/L (ref 0–53)
AST: 16 U/L (ref 0–37)
Albumin: 3.2 g/dL — ABNORMAL LOW (ref 3.5–5.2)
Alkaline Phosphatase: 56 U/L (ref 39–117)
BUN: 40 mg/dL — ABNORMAL HIGH (ref 6–23)
CO2: 32 mEq/L (ref 19–32)
Calcium: 8.5 mg/dL (ref 8.4–10.5)
Chloride: 104 mEq/L (ref 96–112)
Creatinine, Ser: 1.87 mg/dL — ABNORMAL HIGH (ref 0.40–1.50)
GFR: 33.47 mL/min — ABNORMAL LOW (ref >60.00)
Glucose, Bld: 130 mg/dL — ABNORMAL HIGH (ref 70–99)
Potassium: 5 mEq/L (ref 3.5–5.1)
Sodium: 141 mEq/L (ref 135–145)
Total Bilirubin: 0.9 mg/dL (ref 0.2–1.2)
Total Protein: 5.3 g/dL — ABNORMAL LOW (ref 6.0–8.3)

## 2022-07-05 LAB — TESTOSTERONE: Testosterone: 673.7 ng/dL (ref 300.00–890.00)

## 2022-07-05 LAB — PSA: PSA: 2.58 ng/mL (ref 0.10–4.00)

## 2022-07-05 NOTE — Assessment & Plan Note (Signed)
Continue on levothyroxine

## 2022-07-05 NOTE — Assessment & Plan Note (Signed)
Continue with Coreg, hydralazine, Benicar

## 2022-07-06 ENCOUNTER — Ambulatory Visit (INDEPENDENT_AMBULATORY_CARE_PROVIDER_SITE_OTHER): Payer: PPO

## 2022-07-06 VITALS — Ht 72.0 in | Wt 180.0 lb

## 2022-07-06 DIAGNOSIS — Z0000 Encounter for general adult medical examination without abnormal findings: Secondary | ICD-10-CM

## 2022-07-06 NOTE — Patient Instructions (Signed)
Mr. Cygan , Thank you for taking time to come for your Medicare Wellness Visit. I appreciate your ongoing commitment to your health goals. Please review the following plan we discussed and let me know if I can assist you in the future.   Screening recommendations/referrals: Colonoscopy: 11/27/2020; no longer required due to age Recommended yearly ophthalmology/optometry visit for glaucoma screening and checkup Recommended yearly dental visit for hygiene and checkup  Vaccinations: Influenza vaccine: 06/30/2022 Pneumococcal vaccine: 12/12/2012, 09/30/2013 Tdap vaccine: 2//28/2023; due every 10 years Shingles vaccine: 03/02/2017, 06/08/2017   Covid-19: 11/20/2019, 12/11/2019, 08/17/2020, 02/18/2021, 07/22/2021; check with local pharmacy for new Covid Booster available this Fall Season RSV vaccine: check with local pharmacy for vaccine  Advanced directives: Yes; Please bring a copy of your health care power of attorney and living will to the office at your convenience.  Conditions/risks identified: Yes  Next appointment: Follow up in one year for your annual wellness visit.   Preventive Care 1 Years and Older, Male  Preventive care refers to lifestyle choices and visits with your health care provider that can promote health and wellness. What does preventive care include? A yearly physical exam. This is also called an annual well check. Dental exams once or twice a year. Routine eye exams. Ask your health care provider how often you should have your eyes checked. Personal lifestyle choices, including: Daily care of your teeth and gums. Regular physical activity. Eating a healthy diet. Avoiding tobacco and drug use. Limiting alcohol use. Practicing safe sex. Taking low doses of aspirin every day. Taking vitamin and mineral supplements as recommended by your health care provider. What happens during an annual well check? The services and screenings done by your health care provider during your  annual well check will depend on your age, overall health, lifestyle risk factors, and family history of disease. Counseling  Your health care provider may ask you questions about your: Alcohol use. Tobacco use. Drug use. Emotional well-being. Home and relationship well-being. Sexual activity. Eating habits. History of falls. Memory and ability to understand (cognition). Work and work Statistician. Screening  You may have the following tests or measurements: Height, weight, and BMI. Blood pressure. Lipid and cholesterol levels. These may be checked every 5 years, or more frequently if you are over 36 years old. Skin check. Lung cancer screening. You may have this screening every year starting at age 36 if you have a 30-pack-year history of smoking and currently smoke or have quit within the past 15 years. Fecal occult blood test (FOBT) of the stool. You may have this test every year starting at age 93. Flexible sigmoidoscopy or colonoscopy. You may have a sigmoidoscopy every 5 years or a colonoscopy every 10 years starting at age 29. Prostate cancer screening. Recommendations will vary depending on your family history and other risks. Hepatitis C blood test. Hepatitis B blood test. Sexually transmitted disease (STD) testing. Diabetes screening. This is done by checking your blood sugar (glucose) after you have not eaten for a while (fasting). You may have this done every 1-3 years. Abdominal aortic aneurysm (AAA) screening. You may need this if you are a current or former smoker. Osteoporosis. You may be screened starting at age 43 if you are at high risk. Talk with your health care provider about your test results, treatment options, and if necessary, the need for more tests. Vaccines  Your health care provider may recommend certain vaccines, such as: Influenza vaccine. This is recommended every year. Tetanus, diphtheria, and acellular  pertussis (Tdap, Td) vaccine. You may need a Td  booster every 10 years. Zoster vaccine. You may need this after age 25. Pneumococcal 13-valent conjugate (PCV13) vaccine. One dose is recommended after age 58. Pneumococcal polysaccharide (PPSV23) vaccine. One dose is recommended after age 29. Talk to your health care provider about which screenings and vaccines you need and how often you need them. This information is not intended to replace advice given to you by your health care provider. Make sure you discuss any questions you have with your health care provider. Document Released: 11/13/2015 Document Revised: 07/06/2016 Document Reviewed: 08/18/2015 Elsevier Interactive Patient Education  2017 [place] Prevention in the Home Falls can cause injuries. They can happen to people of all ages. There are many things you can do to make your home safe and to help prevent falls. What can I do on the outside of my home? Regularly fix the edges of walkways and driveways and fix any cracks. Remove anything that might make you trip as you walk through a door, such as a raised step or threshold. Trim any bushes or trees on the path to your home. Use bright outdoor lighting. Clear any walking paths of anything that might make someone trip, such as rocks or tools. Regularly check to see if handrails are loose or broken. Make sure that both sides of any steps have handrails. Any raised decks and porches should have guardrails on the edges. Have any leaves, snow, or ice cleared regularly. Use sand or salt on walking paths during winter. Clean up any spills in your garage right away. This includes oil or grease spills. What can I do in the bathroom? Use night lights. Install grab bars by the toilet and in the tub and shower. Do not use towel bars as grab bars. Use non-skid mats or decals in the tub or shower. If you need to sit down in the shower, use a plastic, non-slip stool. Keep the floor dry. Clean up any water that spills on the floor  as soon as it happens. Remove soap buildup in the tub or shower regularly. Attach bath mats securely with double-sided non-slip rug tape. Do not have throw rugs and other things on the floor that can make you trip. What can I do in the bedroom? Use night lights. Make sure that you have a light by your bed that is easy to reach. Do not use any sheets or blankets that are too big for your bed. They should not hang down onto the floor. Have a firm chair that has side arms. You can use this for support while you get dressed. Do not have throw rugs and other things on the floor that can make you trip. What can I do in the kitchen? Clean up any spills right away. Avoid walking on wet floors. Keep items that you use a lot in easy-to-reach places. If you need to reach something above you, use a strong step stool that has a grab bar. Keep electrical cords out of the way. Do not use floor polish or wax that makes floors slippery. If you must use wax, use non-skid floor wax. Do not have throw rugs and other things on the floor that can make you trip. What can I do with my stairs? Do not leave any items on the stairs. Make sure that there are handrails on both sides of the stairs and use them. Fix handrails that are broken or loose. Make sure that handrails  are as long as the stairways. Check any carpeting to make sure that it is firmly attached to the stairs. Fix any carpet that is loose or worn. Avoid having throw rugs at the top or bottom of the stairs. If you do have throw rugs, attach them to the floor with carpet tape. Make sure that you have a light switch at the top of the stairs and the bottom of the stairs. If you do not have them, ask someone to add them for you. What else can I do to help prevent falls? Wear shoes that: Do not have high heels. Have rubber bottoms. Are comfortable and fit you well. Are closed at the toe. Do not wear sandals. If you use a stepladder: Make sure that it is  fully opened. Do not climb a closed stepladder. Make sure that both sides of the stepladder are locked into place. Ask someone to hold it for you, if possible. Clearly mark and make sure that you can see: Any grab bars or handrails. First and last steps. Where the edge of each step is. Use tools that help you move around (mobility aids) if they are needed. These include: Canes. Walkers. Scooters. Crutches. Turn on the lights when you go into a dark area. Replace any light bulbs as soon as they burn out. Set up your furniture so you have a clear path. Avoid moving your furniture around. If any of your floors are uneven, fix them. If there are any pets around you, be aware of where they are. Review your medicines with your doctor. Some medicines can make you feel dizzy. This can increase your chance of falling. Ask your doctor what other things that you can do to help prevent falls. This information is not intended to replace advice given to you by your health care provider. Make sure you discuss any questions you have with your health care provider. Document Released: 08/13/2009 Document Revised: 03/24/2016 Document Reviewed: 11/21/2014 Elsevier Interactive Patient Education  2017 [employer].

## 2022-07-06 NOTE — Progress Notes (Signed)
Subjective:   Scott Oneal is a 81 y.o. male who presents for Medicare Annual/Subsequent preventive examination.  Review of Systems    Virtual Visit via Telephone Note  I connected with  Scott Oneal on 07/06/22 at  3:00 PM EDT by telephone and verified that I am speaking with the correct person using two identifiers.  Location: Patient: home Provider: [place] Persons participating in the virtual visit: [place]   I discussed the limitations, risks, security and privacy concerns of performing an evaluation and management service by telephone and the availability of in person appointments. The patient expressed understanding and agreed to proceed.  Interactive audio and video telecommunications were attempted between this nurse and patient, however failed, due to patient having technical difficulties OR patient did not have access to video capability.  We continued and completed visit with audio only.  Some vital signs may be absent or patient reported.   Sheral Flow, LPN  Cardiac Risk Factors include: advanced age (>16mn, >>33women);dyslipidemia;family history of premature cardiovascular disease;male gender;hypertension     Objective:    Today's Vitals   07/06/22 1504  Weight: 180 lb (81.6 kg)  Height: 6' (1.829 m)  PainSc: 0-No pain   Body mass index is 24.41 kg/m.     07/06/2022    3:06 PM 06/23/2022    8:32 PM 02/10/2022    8:52 AM 06/21/2021    8:49 AM 06/16/2021    1:13 PM 04/21/2021    1:34 PM 04/20/2020    3:10 PM  Advanced Directives  Does Patient Have a Medical Advance Directive? Yes No Yes No Yes Yes Yes  Type of [place]  [place]  [place] Living will;Healthcare Power of Attorney   Does patient want to make changes to medical advance directive?      No - Patient declined No - Patient declined  Copy of  [place] Chart? No - copy requested    No - copy requested No - copy requested   Would patient like information on creating a medical advance directive?  Yes (ED - Information included in AVS)  No - Patient declined       Current Medications (verified) Outpatient Encounter Medications as of 07/06/2022  Medication Sig   acetaminophen (TYLENOL) 500 MG tablet Take 1 tablet (500 mg total) by mouth every 6 (six) hours as needed for up to 30 doses.   apixaban (ELIQUIS) 5 MG TABS tablet Take 2 tablets (10 mg total) by mouth 2 (two) times daily for 7 days, THEN 1 tablet (5 mg total) 2 (two) times daily.   atorvastatin (LIPITOR) 80 MG tablet Take 1 tablet (80 mg total) by mouth at bedtime.   calcitRIOL (ROCALTROL) 0.25 MCG capsule TAKE 1 CAPSULE BY MOUTH EVERY DAY   carvedilol (COREG) 25 MG tablet Take 12.5 mg by mouth 2 (two) times daily with a meal.   cetirizine (ZYRTEC) 10 MG tablet Take 10 mg by mouth daily as needed for allergies.   Cholecalciferol 2000 UNITS TABS Take 2,000 Units by mouth daily.   clonazePAM (KLONOPIN) 0.25 MG disintegrating tablet TAKE 1 TABLET (0.25 MG TOTAL) BY MOUTH 2 (TWO) TIMES DAILY AS NEEDED (ANXIETY).   diclofenac sodium (VOLTAREN) 1 % GEL APPLY 4 G TOPICALLY 4 TIMES DAILY. (Patient taking differently: Apply 4 g topically 4 (four) times daily as needed (pain).)   hydrALAZINE (APRESOLINE) 10 MG  tablet Take 1-2 tablet 3 times a day as needed if blood pressure is >170   isosorbide mononitrate (IMDUR) 30 MG 24 hr tablet Take 1 tablet (30 mg total) by mouth daily.   levothyroxine (SYNTHROID) 175 MCG tablet TAKE 1 TABLET BY MOUTH EVERY DAY   lipase/protease/amylase (CREON) 36000 UNITS CPEP capsule Take 2 capsules (72,000 Units total) by mouth 3 (three) times daily with meals. May also take 1 capsule (36,000 Units total) as needed (with snacks).   Multiple Vitamins-Minerals (PRESERVISION AREDS 2) CAPS Take 1 capsule by mouth in the morning and at bedtime.    nitroGLYCERIN (NITROSTAT) 0.4 MG SL tablet Place 0.4 mg under the tongue every 5 (five) minutes as needed for chest pain.   pantoprazole (PROTONIX) 40 MG tablet Take 1 tablet (40 mg total) by mouth 2 (two) times daily. (Patient taking differently: Take 40 mg by mouth daily.)   predniSONE (DELTASONE) 10 MG tablet Take 20 mg by mouth daily with breakfast.   Propylene Glycol (SYSTANE BALANCE) 0.6 % SOLN Place 1 drop into both eyes daily as needed (Dry eye).   SYRINGE-NEEDLE, DISP, 3 ML (BD ECLIPSE SYRINGE) 23G X 1" 3 ML MISC As directed IM   tamsulosin (FLOMAX) 0.4 MG CAPS capsule Take 0.4 mg by mouth daily.   testosterone cypionate (DEPOTESTOSTERONE CYPIONATE) 200 MG/ML injection INJECT 2ML INTO MUSCLE EVERY 2 WEEKS   No facility-administered encounter medications on file as of 07/06/2022.    Allergies (verified) Biaxin [clarithromycin], Tizanidine, Allantoin-pramoxine, Amlodipine, Amoxicillin, Benzalkonium chloride, Doxycycline, and Metoprolol tartrate   History: Past Medical History:  Diagnosis Date   Allergy    Arthritis    Calculus of gallbladder without mention of cholecystitis    Cataract    bilateral cateracts removed   Celiac disease    Chronic kidney disease    stage 3   Coronary atherosclerosis of unspecified type of vessel, native or graft    Dermatitis 06/08/2021   Erythema of lower extremity 09/08/2021   Esophageal reflux    Glaucoma    History of cellulitis 09/08/2021   History of kidney stones    Loss of weight    Lower extremity weakness 05/31/2021   Macular degeneration    Rt eye . injections   Old myocardial infarction    Osteoporosis, unspecified    Other malaise and fatigue    Other specified cardiac dysrhythmias(427.89)    Other testicular hypofunction    Pancreatic insufficiency 05/31/2021   Personal history of colonic polyps    Postural dizziness with presyncope 12/13/2021   Pure hypercholesterolemia    Thrombocytopenia ([place])    chronic, per PCP notes  dating back to 2009   Tinea pedis 05/31/2021   Unspecified asthma(493.90)    Unspecified essential hypertension    Unspecified hypothyroidism    Venous stasis dermatitis 09/08/2021   Past Surgical History:  Procedure Laterality Date   ARTERY BIOPSY Left 02/18/2022   Procedure: LEFT TEMPORAL ARTERY BIOPSY;  Surgeon: Kieth Brightly Arta Bruce, MD;  Location: [facility];  Service: General;  Laterality: Left;   cataract surgery Bilateral    COLONOSCOPY  06/26/2017   [facility]   COLONOSCOPY WITH PROPOFOL N/A 11/27/2014   Procedure: COLONOSCOPY WITH PROPOFOL;  Surgeon: Milus Banister, MD;  Location: [facility];  Service: Endoscopy;  Laterality: N/A;   CORONARY ANGIOPLASTY     CORONARY STENT PLACEMENT  2011   Cypher; in distal circumflex artery   CYSTOSCOPY  1998   IR URETERAL STENT LEFT NEW ACCESS W/O SEP NEPHROSTOMY  CATH  06/21/2021   KNEE SURGERY     right   LEFT HEART CATH AND CORONARY ANGIOGRAPHY N/A 01/31/2020   Procedure: LEFT HEART CATH AND CORONARY ANGIOGRAPHY;  Surgeon: Belva Crome, MD;  Location: [place] [facility];  Service: Cardiovascular;  Laterality: N/A;   NEPHROLITHOTOMY Left 06/21/2021   Procedure: NEPHROLITHOTOMY PERCUTANEOUS;  Surgeon: Franchot Gallo, MD;  Location: [facility];  Service: Urology;  Laterality: Left;  90 MINS   PERIPHERAL VASCULAR THROMBECTOMY Left 06/24/2022   Procedure: PERIPHERAL VASCULAR THROMBECTOMY;  Surgeon: Cherre Robins, MD;  Location: [place] [facility];  Service: Cardiovascular;  Laterality: Left;   SHOULDER SURGERY Left 07/2013   Dr Shara Blazing   UPPER GASTROINTESTINAL ENDOSCOPY     Family History  Problem Relation Age of Onset   Lymphoma Father    Hypertension Other    Vision loss Maternal Grandmother    Colon cancer Son 7   Asthma Neg Hx    Esophageal cancer Neg Hx    Rectal cancer Neg Hx    Stomach cancer Neg Hx    Colon polyps Neg Hx    Social History   Socioeconomic History   Marital status: Married    Spouse name: Avrohom Mckelvin    Number of children: 3   Years of education: Not on file   Highest education level: Not on file  Occupational History   Occupation: retired    Fish farm manager: RETIRED    Comment: Engineering  Tobacco Use   Smoking status: Never   Smokeless tobacco: Never  Vaping Use   Vaping Use: Never used  Substance and Sexual Activity   Alcohol use: No   Drug use: No   Sexual activity: Yes  Other Topics Concern   Not on file  Social History Narrative   Retired.   Regular Exercise-Yes; yoga & silver sneakers   Daily Caffeine Use.            Social Determinants of Health   Financial Resource Strain: Low Risk  (07/06/2022)   Overall Financial Resource Strain (CARDIA)    Difficulty of Paying Living Expenses: Not hard at all  Food Insecurity: No Food Insecurity (07/06/2022)   Hunger Vital Sign    Worried About Running Out of Food in the Last Year: Never true    Ran Out of Food in the Last Year: Never true  Transportation Needs: No Transportation Needs (07/06/2022)   PRAPARE - Hydrologist (Medical): No    Lack of Transportation (Non-Medical): No  Physical Activity: Sufficiently Active (07/06/2022)   Exercise Vital Sign    Days of Exercise per Week: 7 days    Minutes of Exercise per Session: 30 min  Stress: No Stress Concern Present (07/06/2022)   [place]    Feeling of Stress : Not at all  Social Connections: [place] (07/06/2022)   Social Connection and Isolation Panel [NHANES]    Frequency of Communication with Friends and Family: More than three times a week    Frequency of Social Gatherings with Friends and Family: More than three times a week    Attends Religious Services: More than 4 times per year    Active Member of [employer] or Organizations: Yes    Attends Music therapist: More than 4 times per year    Marital Status: Married    Tobacco Counseling Counseling given: Not  Answered   Clinical Intake:  Pre-visit preparation completed:  Yes  Pain : No/denies pain Pain Score: 0-No pain     BMI - recorded: 25.58 Nutritional Status: BMI 25 -29 Overweight Nutritional Risks: None Diabetes: No  How often do you need to have someone help you when you read instructions, pamphlets, or other written materials from your doctor or pharmacy?: 1 - Never What is the last grade level you completed in school?: MASTER'S DEGREE  Diabetic? no  Interpreter Needed?: No  Information entered by :: SHAENIKA Tomma Ehinger, LPN.   Activities of Daily Living    07/06/2022    3:21 PM 02/10/2022    8:55 AM  In your present state of health, do you have any difficulty performing the following activities:  Hearing? 0   Vision? 0   Difficulty concentrating or making decisions? 0   Walking or climbing stairs? 0   Dressing or bathing? 0   Doing errands, shopping? 0 0  Preparing Food and eating ? N   Using the Toilet? N   In the past six months, have you accidently leaked urine? N   Do you have problems with loss of bowel control? N   Managing your Medications? N   Managing your Finances? N   Housekeeping or managing your Housekeeping? N     Patient Care Team: Plotnikov, Evie Lacks, MD as PCP - Cyndia Diver, MD as PCP - Cardiology (Cardiology) Elsie Stain, MD as Consulting Physician (Pulmonary Disease) Ladene Artist, MD as Consulting Physician (Gastroenterology) Franchot Gallo, MD as Consulting Physician (Urology) Corliss Parish, MD as Consulting Physician (Nephrology) Sherren Mocha, MD as Consulting Physician (Cardiology) Loleta Books, MD as Consulting Physician (Ophthalmology) Tommy Medal, Lavell Islam, MD as Consulting Physician (Infectious Diseases) Stacie Glaze Jacqualyn Posey, MD as Consulting Physician (Ophthalmology)  Indicate any recent Medical Services you may have received from other than [facility] providers in the past year (date may be  approximate).     Assessment:   This is a routine wellness examination for [place].  Hearing/Vision screen Hearing Screening - Comments:: Patient wears right ear hearing aid. Vision Screening - Comments:: Patient wears readers.  Eye exam done by: Dennie Fetters, MD.  Dietary issues and exercise activities discussed: Current Exercise Habits: Home exercise routine;Structured exercise class (Silver Sneakers), Type of exercise: walking;treadmill;stretching;strength training/weights;exercise ball;calisthenics, Time (Minutes): 30, Frequency (Times/Week): 7, Weekly Exercise (Minutes/Week): 210, Exercise limited by: None identified   Goals Addressed             This Visit's Progress    Maintain my curret health status and continue to be independent and physically active.       My goal        Depression Screen    07/06/2022    3:12 PM 06/13/2022   10:53 AM 04/19/2022    1:47 PM 01/05/2022    9:59 AM 06/18/2021   11:16 AM 06/08/2021    9:31 AM 05/07/2021    7:55 AM  PHQ 2/9 Scores  PHQ - 2 Score 0 0 2 0 0 0 0  PHQ- 9 Score   6        Fall Risk    07/06/2022    3:07 PM 06/13/2022   10:53 AM 04/19/2022    1:47 PM 01/05/2022   10:00 AM 06/18/2021   11:16 AM  Fall Risk   Falls in the past year? 1 0 0 0 0  Number falls in past yr: 0 0 0 0   Injury with Fall? 1 0 0 0   Risk  for fall due to :  Impaired balance/gait No Fall Risks  No Fall Risks  Follow up Falls evaluation completed Falls evaluation completed   Falls evaluation completed    [place]:  Any stairs in or around the home? Yes  If so, are there any without handrails? No  Home free of loose throw rugs in walkways, pet beds, electrical cords, etc? Yes  Adequate lighting in your home to reduce risk of falls? Yes   ASSISTIVE DEVICES UTILIZED TO PREVENT FALLS:  Life alert? No  Use of a cane, walker or w/c? No  Grab bars in the bathroom? Yes  Shower chair or bench in shower? No  Elevated toilet seat  or a handicapped toilet? Yes   TIMED UP AND GO:  Was the test performed? No .  Length of time to ambulate 10 feet: n/a sec.   Appearance of gait: Gait not evaluated during this visit.  Cognitive Function:    03/06/2018    9:56 AM  MMSE - Mini Mental State Exam  Orientation to time 5  Orientation to Place 5  Registration 3  Attention/ Calculation 5  Recall 2  Language- name 2 objects 2  Language- repeat 1  Language- follow 3 step command 3  Language- read & follow direction 1  Write a sentence 1  Copy design 1  Total score 29        07/06/2022    3:22 PM 04/20/2020    3:13 PM  6CIT Screen  What Year? 0 points 0 points  What month? 0 points 0 points  What time? 0 points 0 points  Count back from 20 0 points 0 points  Months in reverse 0 points 0 points  Repeat phrase 0 points 0 points  Total Score 0 points 0 points    Immunizations Immunization History  Administered Date(s) Administered   Fluad Quad(high Dose 65+) 07/18/2019, 07/28/2020, 07/21/2021   Influenza Split 07/27/2011, 08/07/2012   Influenza Whole 10/04/2002, 07/30/2008, 08/12/2009, 07/07/2010   Influenza, High Dose Seasonal PF 08/07/2015, 08/01/2016, 07/20/2017, 06/29/2018   Influenza,inj,Quad PF,6+ Mos 07/17/2013, 07/21/2014   Influenza-Unspecified 06/29/2018   PFIZER Comirnaty(Gray Top)Covid-19 Tri-Sucrose Vaccine 02/18/2021   PFIZER(Purple Top)SARS-COV-2 Vaccination 11/20/2019, 12/11/2019, 08/17/2020   PNEUMOCOCCAL CONJUGATE-20 03/30/2022   Pfizer Covid-19 Vaccine Bivalent Booster 62yr & up 07/22/2021   Pneumococcal Conjugate-13 09/30/2013   Pneumococcal Polysaccharide-23 05/15/2006, 12/12/2012   Td 11/24/2010   Tdap 12/28/2021   Zoster Recombinat (Shingrix) 03/02/2017, 06/08/2017   Zoster, Live 07/11/2006    TDAP status: Up to date  Flu Vaccine status: Up to date  Pneumococcal vaccine status: Up to date  Covid-19 vaccine status: Completed vaccines  Qualifies for Shingles Vaccine? Yes    Zostavax completed Yes   Shingrix Completed?: Yes  Screening Tests Health Maintenance  Topic Date Due   COVID-19 Vaccine (6 - Pfizer risk series) 09/16/2021   TETANUS/TDAP  12/29/2031   Pneumonia Vaccine 81 Years old  Completed   INFLUENZA VACCINE  Completed   Zoster Vaccines- Shingrix  Completed   HPV VACCINES  Aged Out   COLONOSCOPY (Pts 45-478yrInsurance coverage will need to be confirmed)  Discontinued    Health Maintenance  Health Maintenance Due  Topic Date Due   COVID-19 Vaccine (6 - Pfizer risk series) 09/16/2021    Colorectal cancer screening: No longer required.   Lung Cancer Screening: (Low Dose CT Chest recommended if Age 81-80ears, 30 pack-year currently smoking OR have quit w/in 15years.) does  not qualify.   Lung Cancer Screening Referral: no  Additional Screening:  Hepatitis C Screening: does not qualify; Completed no  Vision Screening: Recommended annual ophthalmology exams for early detection of glaucoma and other disorders of the eye. Is the patient up to date with their annual eye exam?  Yes  Who is the provider or what is the name of the office in which the patient attends annual eye exams? Dennie Fetters, MD. If pt is not established with a provider, would they like to be referred to a provider to establish care? No .   Dental Screening: Recommended annual dental exams for proper oral hygiene  Community Resource Referral / Chronic Care Management: CRR required this visit?  No   CCM required this visit?  No      Plan:     I have personally reviewed and noted the following in the patient's chart:   Medical and social history Use of alcohol, tobacco or illicit drugs  Current medications and supplements including opioid prescriptions. Patient is not currently taking opioid prescriptions. Functional ability and status Nutritional status Physical activity Advanced directives List of other physicians Hospitalizations, surgeries, and ER  visits in previous 12 months Vitals Screenings to include cognitive, depression, and falls Referrals and appointments  In addition, I have reviewed and discussed with patient certain preventive protocols, quality metrics, and best practice recommendations. A written personalized care plan for preventive services as well as general preventive health recommendations were provided to patient.     Sheral Flow, LPN   03/06/3224   Nurse Notes:  Patient is cogitatively intact. There were no vitals filed for this visit. There is no height or weight on file to calculate BMI. Patient stated that he has no issues with gait or balance; does not use any assistive devices.

## 2022-07-12 ENCOUNTER — Other Ambulatory Visit: Payer: Self-pay | Admitting: *Deleted

## 2022-07-12 DIAGNOSIS — R609 Edema, unspecified: Secondary | ICD-10-CM

## 2022-07-12 DIAGNOSIS — L539 Erythematous condition, unspecified: Secondary | ICD-10-CM

## 2022-07-12 DIAGNOSIS — L03119 Cellulitis of unspecified part of limb: Secondary | ICD-10-CM

## 2022-07-13 ENCOUNTER — Ambulatory Visit: Payer: PPO | Admitting: Internal Medicine

## 2022-07-15 ENCOUNTER — Telehealth: Payer: Self-pay

## 2022-07-15 NOTE — Telephone Encounter (Signed)
Pt called stating that the L leg has a clot below his knee and for the past 2-3 days, he has had increased pain and swelling.  Reviewed pt's chart, returned call for clarification, two identifiers used. Pt stated that he has been elevating properly, taking Eliquis, and wearing his compression stockings. [place] in symptoms.  Spoke with [place], [place] who advised that he keep monitoring. Called pt to relay advice and instructed him on symptoms to monitor for that would be severe enough to go to ED. Confirmed understanding.

## 2022-07-16 ENCOUNTER — Encounter: Payer: Self-pay | Admitting: Internal Medicine

## 2022-07-22 ENCOUNTER — Other Ambulatory Visit: Payer: Self-pay | Admitting: Internal Medicine

## 2022-07-22 DIAGNOSIS — L600 Ingrowing nail: Secondary | ICD-10-CM

## 2022-07-25 ENCOUNTER — Other Ambulatory Visit: Payer: Self-pay | Admitting: Physician Assistant

## 2022-07-25 DIAGNOSIS — Z86718 Personal history of other venous thrombosis and embolism: Secondary | ICD-10-CM

## 2022-07-25 DIAGNOSIS — Z48812 Encounter for surgical aftercare following surgery on the circulatory system: Secondary | ICD-10-CM

## 2022-07-26 ENCOUNTER — Ambulatory Visit (INDEPENDENT_AMBULATORY_CARE_PROVIDER_SITE_OTHER): Payer: PPO | Admitting: Physician Assistant

## 2022-07-26 ENCOUNTER — Ambulatory Visit (HOSPITAL_COMMUNITY)
Admission: RE | Admit: 2022-07-26 | Discharge: 2022-07-26 | Disposition: A | Discharge status: Home / Self Care | Payer: PPO | Source: Ambulatory Visit | Attending: Vascular Surgery | Admitting: Vascular Surgery

## 2022-07-26 ENCOUNTER — Ambulatory Visit (INDEPENDENT_AMBULATORY_CARE_PROVIDER_SITE_OTHER)
Admission: RE | Admit: 2022-07-26 | Discharge: 2022-07-26 | Disposition: A | Discharge status: Home / Self Care | Payer: PPO | Source: Ambulatory Visit | Attending: Vascular Surgery | Admitting: Vascular Surgery

## 2022-07-26 VITALS — BP 182/91 | HR 84 | Temp 97.9°F | Resp 20 | Ht 72.0 in | Wt 186.8 lb

## 2022-07-26 DIAGNOSIS — L539 Erythematous condition, unspecified: Secondary | ICD-10-CM | POA: Diagnosis not present

## 2022-07-26 DIAGNOSIS — R609 Edema, unspecified: Secondary | ICD-10-CM | POA: Diagnosis not present

## 2022-07-26 DIAGNOSIS — Z86718 Personal history of other venous thrombosis and embolism: Secondary | ICD-10-CM

## 2022-07-26 DIAGNOSIS — Z48812 Encounter for surgical aftercare following surgery on the circulatory system: Secondary | ICD-10-CM | POA: Diagnosis not present

## 2022-07-26 DIAGNOSIS — I82422 Acute embolism and thrombosis of left iliac vein: Secondary | ICD-10-CM

## 2022-07-26 DIAGNOSIS — L03119 Cellulitis of unspecified part of limb: Secondary | ICD-10-CM | POA: Insufficient documentation

## 2022-07-26 NOTE — Progress Notes (Signed)
Office Note     CC:  follow up Requesting Provider:  Cassandria Anger, MD  HPI: Scott Oneal is a 81 y.o. ([birth year]) male who presents status post mechanical thrombectomy of left common iliac vein, external iliac vein, common femoral vein, and femoral vein by Dr. Stanford Breed on 06/24/2022 due to iliofemoral DVT.  Patient states the edema of the left leg improved drastically compared to preoperative.  He still has some residual edema of the left leg managed by elevation and compression stockings.  He is taking his Eliquis daily.  He continues to see infectious disease due to ongoing treatment for left leg cellulitis related to insect bite.   Past Medical History:  Diagnosis Date   Allergy    Arthritis    Calculus of gallbladder without mention of cholecystitis    Cataract    bilateral cateracts removed   Celiac disease    Chronic kidney disease    stage 3   Coronary atherosclerosis of unspecified type of vessel, native or graft    Dermatitis 06/08/2021   Erythema of lower extremity 09/08/2021   Esophageal reflux    Glaucoma    History of cellulitis 09/08/2021   History of kidney stones    Loss of weight    Lower extremity weakness 05/31/2021   Macular degeneration    Rt eye . injections   Old myocardial infarction    Osteoporosis, unspecified    Other malaise and fatigue    Other specified cardiac dysrhythmias(427.89)    Other testicular hypofunction    Pancreatic insufficiency 05/31/2021   Personal history of colonic polyps    Postural dizziness with presyncope 12/13/2021   Pure hypercholesterolemia    Thrombocytopenia ([place])    chronic, per PCP notes dating back to 2009   Tinea pedis 05/31/2021   Unspecified asthma(493.90)    Unspecified essential hypertension    Unspecified hypothyroidism    Venous stasis dermatitis 09/08/2021    Past Surgical History:  Procedure Laterality Date   ARTERY BIOPSY Left 02/18/2022   Procedure: LEFT TEMPORAL ARTERY BIOPSY;  Surgeon:  Kieth Brightly Arta Bruce, MD;  Location: [facility];  Service: General;  Laterality: Left;   cataract surgery Bilateral    COLONOSCOPY  06/26/2017   [facility]   COLONOSCOPY WITH PROPOFOL N/A 11/27/2014   Procedure: COLONOSCOPY WITH PROPOFOL;  Surgeon: Milus Banister, MD;  Location: [facility];  Service: Endoscopy;  Laterality: N/A;   CORONARY ANGIOPLASTY     CORONARY STENT PLACEMENT  2011   Cypher; in distal circumflex artery   CYSTOSCOPY  1998   IR URETERAL STENT LEFT NEW ACCESS W/O SEP NEPHROSTOMY CATH  06/21/2021   KNEE SURGERY     right   LEFT HEART CATH AND CORONARY ANGIOGRAPHY N/A 01/31/2020   Procedure: LEFT HEART CATH AND CORONARY ANGIOGRAPHY;  Surgeon: Belva Crome, MD;  Location: [facility];  Service: Cardiovascular;  Laterality: N/A;   NEPHROLITHOTOMY Left 06/21/2021   Procedure: NEPHROLITHOTOMY PERCUTANEOUS;  Surgeon: Franchot Gallo, MD;  Location: [facility];  Service: Urology;  Laterality: Left;  90 MINS   PERIPHERAL VASCULAR THROMBECTOMY Left 06/24/2022   Procedure: PERIPHERAL VASCULAR THROMBECTOMY;  Surgeon: Cherre Robins, MD;  Location: [facility];  Service: Cardiovascular;  Laterality: Left;   SHOULDER SURGERY Left 07/2013   Dr Shara Blazing   UPPER GASTROINTESTINAL ENDOSCOPY      Social History   Socioeconomic History   Marital status: Married    Spouse name: Oaklyn Mans   Number of  children: 3   Years of education: Not on file   Highest education level: Not on file  Occupational History   Occupation: retired    Fish farm manager: RETIRED    Comment: Engineering  Tobacco Use   Smoking status: Never    Passive exposure: Never   Smokeless tobacco: Never  Vaping Use   Vaping Use: Never used  Substance and Sexual Activity   Alcohol use: No   Drug use: No   Sexual activity: Yes  Other Topics Concern   Not on file  Social History Narrative   Retired.   Regular Exercise-Yes; yoga & silver sneakers   Daily Caffeine Use.            Social Determinants of  Health   Financial Resource Strain: Low Risk  (07/06/2022)   Overall Financial Resource Strain (CARDIA)    Difficulty of Paying Living Expenses: Not hard at all  Food Insecurity: No Food Insecurity (07/06/2022)   Hunger Vital Sign    Worried About Running Out of Food in the Last Year: Never true    Ran Out of Food in the Last Year: Never true  Transportation Needs: No Transportation Needs (07/06/2022)   PRAPARE - Hydrologist (Medical): No    Lack of Transportation (Non-Medical): No  Physical Activity: Sufficiently Active (07/06/2022)   Exercise Vital Sign    Days of Exercise per Week: 7 days    Minutes of Exercise per Session: 30 min  Stress: No Stress Concern Present (07/06/2022)   [place]    Feeling of Stress : Not at all  Social Connections: Socially Integrated (07/06/2022)   Social Connection and Isolation Panel [NHANES]    Frequency of Communication with Friends and Family: More than three times a week    Frequency of Social Gatherings with Friends and Family: More than three times a week    Attends Religious Services: More than 4 times per year    Active Member of [employer] or Organizations: Yes    Attends Music therapist: More than 4 times per year    Marital Status: Married  Human resources officer Violence: Not At Risk (07/06/2022)   Humiliation, Afraid, Rape, and Kick questionnaire    Fear of Current or Ex-Partner: No    Emotionally Abused: No    Physically Abused: No    Sexually Abused: No    Family History  Problem Relation Age of Onset   Lymphoma Father    Hypertension Other    Vision loss Maternal Grandmother    Colon cancer Son 50   Asthma Neg Hx    Esophageal cancer Neg Hx    Rectal cancer Neg Hx    Stomach cancer Neg Hx    Colon polyps Neg Hx     Current Outpatient Medications  Medication Sig Dispense Refill   acetaminophen (TYLENOL) 500 MG tablet Take 1 tablet  (500 mg total) by mouth every 6 (six) hours as needed for up to 30 doses. 30 tablet 0   apixaban (ELIQUIS) 5 MG TABS tablet Take 2 tablets (10 mg total) by mouth 2 (two) times daily for 7 days, THEN 1 tablet (5 mg total) 2 (two) times daily. 88 tablet 0   atorvastatin (LIPITOR) 80 MG tablet Take 1 tablet (80 mg total) by mouth at bedtime. 30 tablet 11   calcitRIOL (ROCALTROL) 0.25 MCG capsule TAKE 1 CAPSULE BY MOUTH EVERY DAY 90 capsule 1   carvedilol (  COREG) 25 MG tablet Take 12.5 mg by mouth 2 (two) times daily with a meal.     cetirizine (ZYRTEC) 10 MG tablet Take 10 mg by mouth daily as needed for allergies.     Cholecalciferol 2000 UNITS TABS Take 2,000 Units by mouth daily.     clonazePAM (KLONOPIN) 0.25 MG disintegrating tablet TAKE 1 TABLET (0.25 MG TOTAL) BY MOUTH 2 (TWO) TIMES DAILY AS NEEDED (ANXIETY). 60 tablet 3   diclofenac sodium (VOLTAREN) 1 % GEL APPLY 4 G TOPICALLY 4 TIMES DAILY. (Patient taking differently: Apply 4 g topically 4 (four) times daily as needed (pain).) 200 g 5   hydrALAZINE (APRESOLINE) 10 MG tablet Take 1-2 tablet 3 times a day as needed if blood pressure is >170 60 tablet 3   isosorbide mononitrate (IMDUR) 30 MG 24 hr tablet Take 1 tablet (30 mg total) by mouth daily. 90 tablet 2   levothyroxine (SYNTHROID) 175 MCG tablet TAKE 1 TABLET BY MOUTH EVERY DAY 90 tablet 3   lipase/protease/amylase (CREON) 36000 UNITS CPEP capsule Take 2 capsules (72,000 Units total) by mouth 3 (three) times daily with meals. May also take 1 capsule (36,000 Units total) as needed (with snacks). 240 capsule 11   Multiple Vitamins-Minerals (PRESERVISION AREDS 2) CAPS Take 1 capsule by mouth in the morning and at bedtime.     nitroGLYCERIN (NITROSTAT) 0.4 MG SL tablet Place 0.4 mg under the tongue every 5 (five) minutes as needed for chest pain.     pantoprazole (PROTONIX) 40 MG tablet Take 1 tablet (40 mg total) by mouth 2 (two) times daily. (Patient taking differently: Take 40 mg by mouth  daily.) 60 tablet 5   predniSONE (DELTASONE) 10 MG tablet Take 20 mg by mouth daily with breakfast.     Propylene Glycol (SYSTANE BALANCE) 0.6 % SOLN Place 1 drop into both eyes daily as needed (Dry eye).     SYRINGE-NEEDLE, DISP, 3 ML (BD ECLIPSE SYRINGE) 23G X 1" 3 ML MISC As directed IM 50 each 2   tamsulosin (FLOMAX) 0.4 MG CAPS capsule Take 0.4 mg by mouth daily.     testosterone cypionate (DEPOTESTOSTERONE CYPIONATE) 200 MG/ML injection INJECT 2ML INTO MUSCLE EVERY 2 WEEKS 12 mL 1   No current facility-administered medications for this visit.    Allergies  Allergen Reactions   Biaxin [Clarithromycin]     Dizziness   Tizanidine     Felt funny   Allantoin-Pramoxine Other (See Comments)    Pt does't remember reaction   Amlodipine Swelling    Edema    Amoxicillin Rash    Rash on arms that developed towards end of 7 day treatment   Benzalkonium Chloride Itching and Rash   Doxycycline Photosensitivity and Rash   Metoprolol Tartrate Other (See Comments)    REACTION: fatigue     REVIEW OF SYSTEMS:   [X]  denotes positive finding, [ ]  denotes negative finding Cardiac  Comments:  Chest pain or chest pressure:    Shortness of breath upon exertion:    Short of breath when lying flat:    Irregular heart rhythm:        Vascular    Pain in calf, thigh, or hip brought on by ambulation:    Pain in feet at night that wakes you up from your sleep:     Blood clot in your veins:    Leg swelling:         Pulmonary    Oxygen at home:    Productive  cough:     Wheezing:         Neurologic    Sudden weakness in arms or legs:     Sudden numbness in arms or legs:     Sudden onset of difficulty speaking or slurred speech:    Temporary loss of vision in one eye:     Problems with dizziness:         Gastrointestinal    Blood in stool:     Vomited blood:         Genitourinary    Burning when urinating:     Blood in urine:        Psychiatric    Major depression:          Hematologic    Bleeding problems:    Problems with blood clotting too easily:        Skin    Rashes or ulcers:        Constitutional    Fever or chills:      PHYSICAL EXAMINATION:  Vitals:   07/26/22 0911  BP: (!) 182/91  Pulse: 84  Resp: 20  Temp: 97.9 F (36.6 C)  TempSrc: Temporal  SpO2: 99%  Weight: 186 lb 12.8 oz (84.7 kg)  Height: 6' (1.829 m)    General:  WDWN in NAD; vital signs documented above Gait: Not observed HENT: WNL, normocephalic Pulmonary: normal non-labored breathing , without Rales, rhonchi,  wheezing Cardiac: regular HR Abdomen: soft, NT, no masses Skin: without rashes Vascular Exam/Pulses:  Right Left  DP 2+ (normal) 2+ (normal)  PT 1+ (weak) 1+ (weak)   Extremities: without ischemic changes, without Gangrene , without cellulitis; without open wounds; some pitting edema around the left ankle; pigmentation changes of bilateral shins Musculoskeletal: no muscle wasting or atrophy  Neurologic: A&O X 3;  No focal weakness or paresthesias are detected Psychiatric:  The pt has Normal affect.   Non-Invasive Vascular Imaging:   Left common and external iliac veins widely patent Left common femoral vein widely patent Residual thrombus in the left femoral vein and popliteal vein    ASSESSMENT/PLAN:: 81 y.o. male status post left iliofemoral mechanical thrombectomy due to extensive DVT  -Edema of the left lower extremity drastically improved compared to preoperatively -Duplex demonstrates widely patent left iliac venous system as well as left common femoral vein; duplex demonstrates some residual femoral vein and popliteal vein thrombus -Continue regular use of compression.  Also recommended continued elevation of the legs when possible during the day.  Can stepdown to a knee-high compression sock if the thigh-high compression becomes intolerable -Continue Eliquis for at least 6 months -Recheck IV/iliac and left leg venous duplex in 1  year   Dagoberto Ligas, PA-C [facility] [phone]  Clinic MD:   Stanford Breed

## 2022-07-27 DIAGNOSIS — R350 Frequency of micturition: Secondary | ICD-10-CM | POA: Diagnosis not present

## 2022-07-27 DIAGNOSIS — N200 Calculus of kidney: Secondary | ICD-10-CM | POA: Diagnosis not present

## 2022-07-27 DIAGNOSIS — N401 Enlarged prostate with lower urinary tract symptoms: Secondary | ICD-10-CM | POA: Diagnosis not present

## 2022-07-27 DIAGNOSIS — R3121 Asymptomatic microscopic hematuria: Secondary | ICD-10-CM | POA: Diagnosis not present

## 2022-07-28 DIAGNOSIS — K753 Granulomatous hepatitis, not elsewhere classified: Secondary | ICD-10-CM | POA: Diagnosis not present

## 2022-07-28 DIAGNOSIS — N200 Calculus of kidney: Secondary | ICD-10-CM | POA: Diagnosis not present

## 2022-07-28 DIAGNOSIS — K8020 Calculus of gallbladder without cholecystitis without obstruction: Secondary | ICD-10-CM | POA: Diagnosis not present

## 2022-07-28 DIAGNOSIS — K7460 Unspecified cirrhosis of liver: Secondary | ICD-10-CM | POA: Diagnosis not present

## 2022-08-02 ENCOUNTER — Other Ambulatory Visit: Payer: Self-pay | Admitting: Internal Medicine

## 2022-08-03 ENCOUNTER — Telehealth: Payer: Self-pay

## 2022-08-03 ENCOUNTER — Ambulatory Visit: Payer: PPO | Admitting: Podiatry

## 2022-08-03 DIAGNOSIS — M79674 Pain in right toe(s): Secondary | ICD-10-CM | POA: Diagnosis not present

## 2022-08-03 DIAGNOSIS — B351 Tinea unguium: Secondary | ICD-10-CM | POA: Diagnosis not present

## 2022-08-03 DIAGNOSIS — M79675 Pain in left toe(s): Secondary | ICD-10-CM

## 2022-08-03 DIAGNOSIS — H353123 Nonexudative age-related macular degeneration, left eye, advanced atrophic without subfoveal involvement: Secondary | ICD-10-CM | POA: Diagnosis not present

## 2022-08-03 DIAGNOSIS — H04123 Dry eye syndrome of bilateral lacrimal glands: Secondary | ICD-10-CM | POA: Diagnosis not present

## 2022-08-03 DIAGNOSIS — H353211 Exudative age-related macular degeneration, right eye, with active choroidal neovascularization: Secondary | ICD-10-CM | POA: Diagnosis not present

## 2022-08-03 NOTE — Telephone Encounter (Signed)
Pt sent MyChart message asking about compression hose. He is in a medium from [facility] and feels they are tight on his swollen leg that had the DVT. I explained he should ideally be in 20-30 compression. He is unsure the amount as they just say "medium". I have offered him to come in to get fitted for stockings from [place] and he will call us back when he is home to let us know when he will be in the area and we can schedule him a nurse visit to get fitted.

## 2022-08-03 NOTE — Progress Notes (Signed)
Chief Complaint  Patient presents with   Ingrown Toenail    Patient states that left great toe nail  has been hurting him for some time, he also states that the right great toe nail is like a horses hoof and he has tried to file it down.    SUBJECTIVE Patient presents to office today complaining of elongated, thickened nails that cause pain while ambulating in shoes.  Patient also states that he wears compression hose daily which seem to constrict his toes.  This is increasing the pain.  Patient is unable to trim their own nails. Patient is here for further evaluation and treatment.  Past Medical History:  Diagnosis Date   Allergy    Arthritis    Calculus of gallbladder without mention of cholecystitis    Cataract    bilateral cateracts removed   Celiac disease    Chronic kidney disease    stage 3   Coronary atherosclerosis of unspecified type of vessel, native or graft    Dermatitis 06/08/2021   Erythema of lower extremity 09/08/2021   Esophageal reflux    Glaucoma    History of cellulitis 09/08/2021   History of kidney stones    Loss of weight    Lower extremity weakness 05/31/2021   Macular degeneration    Rt eye . injections   Old myocardial infarction    Osteoporosis, unspecified    Other malaise and fatigue    Other specified cardiac dysrhythmias(427.89)    Other testicular hypofunction    Pancreatic insufficiency 05/31/2021   Personal history of colonic polyps    Postural dizziness with presyncope 12/13/2021   Pure hypercholesterolemia    Thrombocytopenia ([place])    chronic, per PCP notes dating back to 2009   Tinea pedis 05/31/2021   Unspecified asthma(493.90)    Unspecified essential hypertension    Unspecified hypothyroidism    Venous stasis dermatitis 09/08/2021    Allergies  Allergen Reactions   Biaxin [Clarithromycin]     Dizziness   Tizanidine     Felt funny   Allantoin-Pramoxine Other (See Comments)    Pt does't remember reaction   Amlodipine  Swelling    Edema    Amoxicillin Rash    Rash on arms that developed towards end of 7 day treatment   Benzalkonium Chloride Itching and Rash   Doxycycline Photosensitivity and Rash   Metoprolol Tartrate Other (See Comments)    REACTION: fatigue     OBJECTIVE General Patient is awake, alert, and oriented x 3 and in no acute distress. Derm Skin is dry and supple bilateral. Negative open lesions or macerations. Remaining integument unremarkable. Nails are tender, long, thickened and dystrophic with subungual debris, consistent with onychomycosis, 1-5 bilateral. No signs of infection noted. Vasc  DP and PT pedal pulses palpable bilaterally. Temperature gradient within normal limits.  Neuro Epicritic and protective threshold sensation grossly intact bilaterally.  Musculoskeletal Exam No symptomatic pedal deformities noted bilateral. Muscular strength within normal limits.  ASSESSMENT 1.  Pain due to onychomycosis of toenails both  PLAN OF CARE 1. Patient evaluated today.  2. Instructed to maintain good pedal hygiene and foot care.  3. Mechanical debridement of nails 1-5 bilaterally performed using a nail nipper. Filed with dremel without incident.  4.  Open toe compression hose daily  5.  Return to clinic in 3 mos.    Edrick Kins, DPM [facility]  Dr. Edrick Kins, DPM    [address]  [place], [place] [address]                Office [phone]  Fax [phone]

## 2022-08-04 DIAGNOSIS — M7989 Other specified soft tissue disorders: Secondary | ICD-10-CM

## 2022-08-08 ENCOUNTER — Telehealth: Payer: Self-pay

## 2022-08-08 DIAGNOSIS — K8689 Other specified diseases of pancreas: Secondary | ICD-10-CM | POA: Diagnosis not present

## 2022-08-08 DIAGNOSIS — I499 Cardiac arrhythmia, unspecified: Secondary | ICD-10-CM | POA: Diagnosis not present

## 2022-08-08 DIAGNOSIS — N184 Chronic kidney disease, stage 4 (severe): Secondary | ICD-10-CM | POA: Diagnosis not present

## 2022-08-08 DIAGNOSIS — I5042 Chronic combined systolic (congestive) and diastolic (congestive) heart failure: Secondary | ICD-10-CM | POA: Diagnosis not present

## 2022-08-08 DIAGNOSIS — D696 Thrombocytopenia, unspecified: Secondary | ICD-10-CM | POA: Diagnosis not present

## 2022-08-08 DIAGNOSIS — Z86718 Personal history of other venous thrombosis and embolism: Secondary | ICD-10-CM | POA: Diagnosis not present

## 2022-08-09 NOTE — Telephone Encounter (Signed)
Noted! Thank you

## 2022-08-11 ENCOUNTER — Encounter: Payer: Self-pay | Admitting: Internal Medicine

## 2022-08-11 ENCOUNTER — Ambulatory Visit (INDEPENDENT_AMBULATORY_CARE_PROVIDER_SITE_OTHER): Payer: PPO | Admitting: Internal Medicine

## 2022-08-11 ENCOUNTER — Ambulatory Visit: Payer: PPO | Admitting: Internal Medicine

## 2022-08-11 DIAGNOSIS — E034 Atrophy of thyroid (acquired): Secondary | ICD-10-CM | POA: Diagnosis not present

## 2022-08-11 DIAGNOSIS — I87009 Postthrombotic syndrome without complications of unspecified extremity: Secondary | ICD-10-CM | POA: Diagnosis not present

## 2022-08-11 DIAGNOSIS — I82422 Acute embolism and thrombosis of left iliac vein: Secondary | ICD-10-CM

## 2022-08-11 DIAGNOSIS — R609 Edema, unspecified: Secondary | ICD-10-CM | POA: Diagnosis not present

## 2022-08-11 DIAGNOSIS — Z7901 Long term (current) use of anticoagulants: Secondary | ICD-10-CM | POA: Diagnosis not present

## 2022-08-11 MED ORDER — TESTOSTERONE CYPIONATE 200 MG/ML IM SOLN: INTRAMUSCULAR | 1 refills | Status: DC

## 2022-08-11 MED ORDER — APIXABAN 5 MG PO TABS: ORAL_TABLET | ORAL | 5 refills | Status: DC

## 2022-08-11 MED ORDER — PREDNISONE 10 MG PO TABS: 10.0000 mg | ORAL_TABLET | Freq: Every day | ORAL | 3 refills | Status: DC

## 2022-08-11 NOTE — Assessment & Plan Note (Signed)
Use compression socks Elevate legs Post-phlebitic syndrome LLE

## 2022-08-11 NOTE — Assessment & Plan Note (Signed)
Post-phlebitic syndrome LLE

## 2022-08-11 NOTE — Assessment & Plan Note (Signed)
On Eliquis  Compression sock

## 2022-08-11 NOTE — Progress Notes (Signed)
Subjective:  Patient ID: Scott Oneal, male    DOB: 1941-03-30  Age: 81 y.o. MRN: [id number]  CC: Follow-up (6 week f/u)   HPI Fergus W Sheils presents for sweats, DVT, numbness, sweats  Outpatient Medications Prior to Visit  Medication Sig Dispense Refill   acetaminophen (TYLENOL) 500 MG tablet Take 1 tablet (500 mg total) by mouth every 6 (six) hours as needed for up to 30 doses. 30 tablet 0   atorvastatin (LIPITOR) 80 MG tablet Take 1 tablet (80 mg total) by mouth at bedtime. 30 tablet 11   calcitRIOL (ROCALTROL) 0.25 MCG capsule TAKE 1 CAPSULE BY MOUTH EVERY DAY 90 capsule 1   carvedilol (COREG) 25 MG tablet Take 12.5 mg by mouth 2 (two) times daily with a meal.     cetirizine (ZYRTEC) 10 MG tablet Take 10 mg by mouth daily as needed for allergies.     Cholecalciferol 2000 UNITS TABS Take 2,000 Units by mouth daily.     clonazePAM (KLONOPIN) 0.25 MG disintegrating tablet TAKE 1 TABLET (0.25 MG TOTAL) BY MOUTH 2 (TWO) TIMES DAILY AS NEEDED (ANXIETY). 60 tablet 3   diclofenac sodium (VOLTAREN) 1 % GEL APPLY 4 G TOPICALLY 4 TIMES DAILY. (Patient taking differently: Apply 4 g topically 4 (four) times daily as needed (pain).) 200 g 5   hydrALAZINE (APRESOLINE) 10 MG tablet Take 1-2 tablet 3 times a day as needed if blood pressure is >170 60 tablet 3   isosorbide mononitrate (IMDUR) 30 MG 24 hr tablet TAKE 1 TABLET BY MOUTH EVERY DAY 90 tablet 2   levothyroxine (SYNTHROID) 175 MCG tablet TAKE 1 TABLET BY MOUTH EVERY DAY 90 tablet 3   lipase/protease/amylase (CREON) 36000 UNITS CPEP capsule Take 2 capsules (72,000 Units total) by mouth 3 (three) times daily with meals. May also take 1 capsule (36,000 Units total) as needed (with snacks). 240 capsule 11   Multiple Vitamins-Minerals (PRESERVISION AREDS 2) CAPS Take 1 capsule by mouth in the morning and at bedtime.     nitroGLYCERIN (NITROSTAT) 0.4 MG SL tablet Place 0.4 mg under the tongue every 5 (five) minutes as needed for chest pain.      pantoprazole (PROTONIX) 40 MG tablet Take 1 tablet (40 mg total) by mouth 2 (two) times daily. (Patient taking differently: Take 40 mg by mouth daily.) 60 tablet 5   Propylene Glycol (SYSTANE BALANCE) 0.6 % SOLN Place 1 drop into both eyes daily as needed (Dry eye).     SYRINGE-NEEDLE, DISP, 3 ML (BD ECLIPSE SYRINGE) 23G X 1" 3 ML MISC As directed IM 50 each 2   tamsulosin (FLOMAX) 0.4 MG CAPS capsule Take 0.4 mg by mouth daily.     predniSONE (DELTASONE) 10 MG tablet Take 20 mg by mouth daily with breakfast.     testosterone cypionate (DEPOTESTOSTERONE CYPIONATE) 200 MG/ML injection INJECT 2ML INTO MUSCLE EVERY 2 WEEKS 12 mL 1   apixaban (ELIQUIS) 5 MG TABS tablet Take 2 tablets (10 mg total) by mouth 2 (two) times daily for 7 days, THEN 1 tablet (5 mg total) 2 (two) times daily. 88 tablet 0   No facility-administered medications prior to visit.    ROS: Review of Systems  Constitutional:  Positive for fatigue. Negative for appetite change and unexpected weight change.  HENT:  Negative for congestion, nosebleeds, sneezing, sore throat and trouble swallowing.   Eyes:  Negative for itching and visual disturbance.  Respiratory:  Negative for cough and shortness of breath.   Cardiovascular:  Positive for leg swelling. Negative for chest pain and palpitations.  Gastrointestinal:  Negative for abdominal distention, blood in stool, diarrhea and nausea.  Genitourinary:  Negative for frequency and hematuria.  Musculoskeletal:  Negative for back pain, gait problem, joint swelling and neck pain.  Skin:  Negative for rash.  Neurological:  Negative for dizziness, tremors, speech difficulty and weakness.  Hematological:  Bruises/bleeds easily.  Psychiatric/Behavioral:  Negative for agitation, dysphoric mood and sleep disturbance. The patient is not nervous/anxious.     Objective:  BP 120/62 (BP Location: Left Arm)   Pulse 74   Temp 98.4 F (36.9 C) (Oral)   Ht 6' (1.829 m)   Wt 192 lb 6.4 oz (87.3  kg)   SpO2 95%   BMI 26.09 kg/m   BP Readings from Last 3 Encounters:  08/11/22 120/62  07/26/22 (!) 182/91  06/29/22 118/60    Wt Readings from Last 3 Encounters:  08/11/22 192 lb 6.4 oz (87.3 kg)  07/26/22 186 lb 12.8 oz (84.7 kg)  07/06/22 180 lb (81.6 kg)    Physical Exam Constitutional:      General: He is not in acute distress.    Appearance: He is well-developed.     Comments: NAD  Eyes:     Conjunctiva/sclera: Conjunctivae normal.     Pupils: Pupils are equal, round, and reactive to light.  Neck:     Thyroid: No thyromegaly.     Vascular: No JVD.  Cardiovascular:     Rate and Rhythm: Normal rate. Rhythm irregular.     Heart sounds: Normal heart sounds. No murmur heard.    No friction rub. No gallop.  Pulmonary:     Effort: Pulmonary effort is normal. No respiratory distress.     Breath sounds: Normal breath sounds. No wheezing or rales.  Chest:     Chest wall: No tenderness.  Abdominal:     General: Bowel sounds are normal. There is no distension.     Palpations: Abdomen is soft. There is no mass.     Tenderness: There is no abdominal tenderness. There is no guarding or rebound.  Musculoskeletal:        General: No tenderness. Normal range of motion.     Cervical back: Normal range of motion.  Lymphadenopathy:     Cervical: No cervical adenopathy.  Skin:    General: Skin is warm and dry.     Findings: No rash.  Neurological:     Mental Status: He is alert and oriented to person, place, and time.     Cranial Nerves: No cranial nerve deficit.     Motor: No abnormal muscle tone.     Coordination: Coordination normal.     Gait: Gait normal.     Deep Tendon Reflexes: Reflexes are normal and symmetric.  Psychiatric:        Behavior: Behavior normal.        Thought Content: Thought content normal.        Judgment: Judgment normal.     Lab Results  Component Value Date   WBC 6.7 07/05/2022   HGB 13.5 07/05/2022   HCT 41.6 07/05/2022   PLT 87.0 (L)  07/05/2022   GLUCOSE 130 (H) 07/05/2022   CHOL 143 06/25/2022   TRIG 88 06/25/2022   HDL 42 06/25/2022   LDLCALC 83 06/25/2022   ALT 21 07/05/2022   AST 16 07/05/2022   NA 141 07/05/2022   K 5.0 07/05/2022   CL 104 07/05/2022   CREATININE  1.87 (H) 07/05/2022   BUN 40 (H) 07/05/2022   CO2 32 07/05/2022   TSH 11.04 (H) 04/15/2021   PSA 2.58 07/05/2022   INR 1.3 (H) 06/23/2022   HGBA1C 6.0 12/28/2021   MICROALBUR 1.2 05/18/2017    VAS [place] IVC/ILIAC (VENOUS ONLY)  Result Date: 07/26/2022 IVC/ILIAC STUDY Patient Name:  LEUL NARRAMORE  Date of Exam:   07/26/2022 Medical Rec #: [id number]      Accession #:    [id number] Date of Birth: 03-31-1941     Patient Gender: M Patient Age:   45 years Exam Location:  [facility] Procedure:      VAS [place] IVC/ILIAC (VENOUS ONLY) Referring Phys: Dagoberto Ligas --------------------------------------------------------------------------------  Indications: Follow up DVT/thrombectomy Vascular Interventions: 06/24/22: mechanical thrombectomy of left common iliac                         vein, left external iliac vein, left common femoral                         vein, left femoral vein.  Performing Technologist: Ralene Cork RVT  Examination Guidelines: A complete evaluation includes B-mode imaging, spectral Doppler, color Doppler, and power Doppler as needed of all accessible portions of each vessel. Bilateral testing is considered an integral part of a complete examination. Limited examinations for reoccurring indications may be performed as noted.  IVC/Iliac Findings: +----------+------+--------+--------+    IVC    PatentThrombusComments +----------+------+--------+--------+ IVC Prox  patent                 +----------+------+--------+--------+ IVC Mid   patent                 +----------+------+--------+--------+ IVC Distalpatent                 +----------+------+--------+--------+   +-------------------+---------+-----------+---------+-----------+--------+         CIV        RT-PatentRT-ThrombusLT-PatentLT-ThrombusComments +-------------------+---------+-----------+---------+-----------+--------+ Common Iliac Prox                       patent                      +-------------------+---------+-----------+---------+-----------+--------+ Common Iliac Mid                        patent                      +-------------------+---------+-----------+---------+-----------+--------+ Common Iliac Distal                     patent                      +-------------------+---------+-----------+---------+-----------+--------+  +-------------------------+---------+-----------+---------+-----------+--------+            EIV           RT-PatentRT-ThrombusLT-PatentLT-ThrombusComments +-------------------------+---------+-----------+---------+-----------+--------+ External Iliac Vein Prox                      patent                      +-------------------------+---------+-----------+---------+-----------+--------+ External Iliac Vein Mid                       patent                      +-------------------------+---------+-----------+---------+-----------+--------+  External Iliac Vein                           patent                      Distal                                                                    +-------------------------+---------+-----------+---------+-----------+--------+   Summary: IVC/Iliac: No evidence of DVT in the IVC, left CIV or EIV.  *See table(s) above for measurements and observations.  Electronically signed by Monica Martinez MD on 07/26/2022 at 9:53:08 AM.    Final    VAS [place] LOWER EXTREMITY VENOUS (DVT)  Result Date: 07/26/2022  Lower Venous DVT Study Patient Name:  ABU HEAVIN  Date of Exam:   07/26/2022 Medical Rec #: [id number]      Accession #:    [id number] Date of Birth: 03/20/1941     Patient Gender: M  Patient Age:   67 years Exam Location:  [facility] Procedure:      VAS [place] LOWER EXTREMITY VENOUS (DVT) Referring Phys: Dagoberto Ligas --------------------------------------------------------------------------------  Indications: Follow up ilio-femoral DVT/thrombectomy.  Risk Factors: Surgery 06/24/22: mechanical thrombectomy of left common iliac vein, left external iliac vein, left common femoral vein, left femoral vein. Anticoagulation: Eliquis. Comparison Study: 06/23/22: LEFT:                   - Findings consistent with acute deep vein thrombosis                   involving the external illiac vein, left common femoral vein,                   SF junction, left femoral vein, left proximal profunda vein,                   left popliteal vein, left posterior tibial veins, left                   peroneal veins, and left gastrocnemius veins. Performing Technologist: Ralene Cork RVT  Examination Guidelines: A complete evaluation includes B-mode imaging, spectral Doppler, color Doppler, and power Doppler as needed of all accessible portions of each vessel. Bilateral testing is considered an integral part of a complete examination. Limited examinations for reoccurring indications may be performed as noted. The reflux portion of the exam is performed with the patient in reverse Trendelenburg.  +-----+---------------+---------+-----------+----------+--------------+ RIGHTCompressibilityPhasicitySpontaneityPropertiesThrombus Aging +-----+---------------+---------+-----------+----------+--------------+ CFV  Full           Yes      Yes                                 +-----+---------------+---------+-----------+----------+--------------+ SFJ  Full                    Yes                                 +-----+---------------+---------+-----------+----------+--------------+  +---------+---------------+---------+-----------+----------+--------------+ LEFT      CompressibilityPhasicitySpontaneityPropertiesThrombus  Aging +---------+---------------+---------+-----------+----------+--------------+ CFV      Full           Yes      Yes                                 +---------+---------------+---------+-----------+----------+--------------+ SFJ      Full                    Yes                                 +---------+---------------+---------+-----------+----------+--------------+ FV Prox  None           No       No                   Continued      +---------+---------------+---------+-----------+----------+--------------+ FV Mid   None           No       No                   Continued      +---------+---------------+---------+-----------+----------+--------------+ FV DistalNone           No       No                   Continued      +---------+---------------+---------+-----------+----------+--------------+ PFV      Full                                                        +---------+---------------+---------+-----------+----------+--------------+ POP      None           No       No                                  +---------+---------------+---------+-----------+----------+--------------+ PTV      Partial                 Yes                  Continued      +---------+---------------+---------+-----------+----------+--------------+ PERO     Partial                 Yes                  Continued      +---------+---------------+---------+-----------+----------+--------------+ GSV      Full           Yes      Yes                                 +---------+---------------+---------+-----------+----------+--------------+     Summary: RIGHT: - No evidence of common femoral vein obstruction.  LEFT: - There is no evidence of superficial venous thrombosis. - Continued DVT from the calf veins through to the confluence with the CFV.  *See table(s) above for measurements and observations. Electronically signed  by Monica Martinez MD on 07/26/2022 at 9:52:52 AM.    Final  Assessment & Plan:   Problem List Items Addressed This Visit     Anticoagulant long-term use    Use compression socks Elevate legs Post-phlebitic syndrome LLE      DVT (deep venous thrombosis) (HCC)    On Eliquis  Compression sock      Relevant Medications   apixaban (ELIQUIS) 5 MG TABS tablet   Edema     Use compression socks Elevate legs at night and periodically during the day Post-phlebitic syndrome LLE      Hypothyroidism    Chronic Cont on Levothroid      Postphlebitic syndrome    Post-phlebitic syndrome LLE      Relevant Medications   apixaban (ELIQUIS) 5 MG TABS tablet      Meds ordered this encounter  Medications   predniSONE (DELTASONE) 10 MG tablet    Sig: Take 1 tablet (10 mg total) by mouth daily with breakfast.    Dispense:  30 tablet    Refill:  3   apixaban (ELIQUIS) 5 MG TABS tablet    Sig: Take 1 tablet (5 mg total) by mouth 2 (two) times daily for 7 days, THEN 1 tablet (5 mg total) 2 (two) times daily.    Dispense:  60 tablet    Refill:  5   testosterone cypionate (DEPOTESTOSTERONE CYPIONATE) 200 MG/ML injection    Sig: INJECT 2ML INTO MUSCLE EVERY 2 WEEKS    Dispense:  12 mL    Refill:  1    Not to exceed 2 additional fills before 04/09/2022      Follow-up: Return in about 6 weeks (around 09/22/2022) for a follow-up visit.  Walker Kehr, MD

## 2022-08-11 NOTE — Assessment & Plan Note (Signed)
Chronic Cont on Levothroid

## 2022-08-11 NOTE — Assessment & Plan Note (Addendum)
  Use compression socks Elevate legs at night and periodically during the day Post-phlebitic syndrome LLE

## 2022-09-12 DIAGNOSIS — N401 Enlarged prostate with lower urinary tract symptoms: Secondary | ICD-10-CM | POA: Diagnosis not present

## 2022-09-12 DIAGNOSIS — N200 Calculus of kidney: Secondary | ICD-10-CM | POA: Diagnosis not present

## 2022-09-12 DIAGNOSIS — R3129 Other microscopic hematuria: Secondary | ICD-10-CM | POA: Diagnosis not present

## 2022-09-12 DIAGNOSIS — R351 Nocturia: Secondary | ICD-10-CM | POA: Diagnosis not present

## 2022-09-12 DIAGNOSIS — R350 Frequency of micturition: Secondary | ICD-10-CM | POA: Diagnosis not present

## 2022-09-12 DIAGNOSIS — R3121 Asymptomatic microscopic hematuria: Secondary | ICD-10-CM | POA: Diagnosis not present

## 2022-09-20 ENCOUNTER — Encounter: Payer: Self-pay | Admitting: Internal Medicine

## 2022-09-20 ENCOUNTER — Ambulatory Visit (INDEPENDENT_AMBULATORY_CARE_PROVIDER_SITE_OTHER): Payer: PPO | Admitting: Internal Medicine

## 2022-09-20 VITALS — BP 132/72 | HR 66 | Temp 98.0°F | Ht 72.0 in | Wt 189.2 lb

## 2022-09-20 DIAGNOSIS — K900 Celiac disease: Secondary | ICD-10-CM

## 2022-09-20 DIAGNOSIS — I739 Peripheral vascular disease, unspecified: Secondary | ICD-10-CM | POA: Diagnosis not present

## 2022-09-20 DIAGNOSIS — G8929 Other chronic pain: Secondary | ICD-10-CM | POA: Diagnosis not present

## 2022-09-20 DIAGNOSIS — M5442 Lumbago with sciatica, left side: Secondary | ICD-10-CM

## 2022-09-20 MED ORDER — TRAMADOL HCL 50 MG PO TABS: 50.0000 mg | ORAL_TABLET | Freq: Four times a day (QID) | ORAL | 2 refills | Status: DC | PRN

## 2022-09-20 MED ORDER — PREDNISONE 10 MG PO TABS: 10.0000 mg | ORAL_TABLET | Freq: Every day | ORAL | 3 refills | Status: DC

## 2022-09-20 MED ORDER — METHYLPREDNISOLONE ACETATE 80 MG/ML IJ SUSP
80.0000 mg | Freq: Once | INTRAMUSCULAR | Status: AC
  Administered 2022-09-20: 80 mg via INTRAMUSCULAR

## 2022-09-20 NOTE — Progress Notes (Signed)
Pls cosign for DEPO inj../lmb

## 2022-09-20 NOTE — Assessment & Plan Note (Signed)
LLE>RLE, new Check Duplex US On Lipitor

## 2022-09-20 NOTE — Assessment & Plan Note (Signed)
On gluten free diet

## 2022-09-20 NOTE — Progress Notes (Signed)
Subjective:  Patient ID: Scott Oneal, male    DOB: 1941-07-02  Age: 81 y.o. MRN: [id number]  CC: Follow-up (6 WEEK F/U)   HPI Scott Oneal presents for severe LBP on the R side after raking leaves on the weekend. He could not sleep.  C/o LLE pain after DVT? With walking. He has to rest to relieve pain  F/u DVT   Outpatient Medications Prior to Visit  Medication Sig Dispense Refill   acetaminophen (TYLENOL) 500 MG tablet Take 1 tablet (500 mg total) by mouth every 6 (six) hours as needed for up to 30 doses. 30 tablet 0   atorvastatin (LIPITOR) 80 MG tablet Take 1 tablet (80 mg total) by mouth at bedtime. 30 tablet 11   calcitRIOL (ROCALTROL) 0.25 MCG capsule TAKE 1 CAPSULE BY MOUTH EVERY DAY 90 capsule 1   carvedilol (COREG) 25 MG tablet Take 12.5 mg by mouth 2 (two) times daily with a meal.     cetirizine (ZYRTEC) 10 MG tablet Take 10 mg by mouth daily as needed for allergies.     Cholecalciferol 2000 UNITS TABS Take 2,000 Units by mouth daily.     clonazePAM (KLONOPIN) 0.25 MG disintegrating tablet TAKE 1 TABLET (0.25 MG TOTAL) BY MOUTH 2 (TWO) TIMES DAILY AS NEEDED (ANXIETY). 60 tablet 3   diclofenac sodium (VOLTAREN) 1 % GEL APPLY 4 G TOPICALLY 4 TIMES DAILY. (Patient taking differently: Apply 4 g topically 4 (four) times daily as needed (pain).) 200 g 5   hydrALAZINE (APRESOLINE) 10 MG tablet Take 1-2 tablet 3 times a day as needed if blood pressure is >170 60 tablet 3   isosorbide mononitrate (IMDUR) 30 MG 24 hr tablet TAKE 1 TABLET BY MOUTH EVERY DAY 90 tablet 2   levothyroxine (SYNTHROID) 175 MCG tablet TAKE 1 TABLET BY MOUTH EVERY DAY 90 tablet 3   lipase/protease/amylase (CREON) 36000 UNITS CPEP capsule Take 2 capsules (72,000 Units total) by mouth 3 (three) times daily with meals. May also take 1 capsule (36,000 Units total) as needed (with snacks). 240 capsule 11   Multiple Vitamins-Minerals (PRESERVISION AREDS 2) CAPS Take 1 capsule by mouth in the morning and at  bedtime.     nitroGLYCERIN (NITROSTAT) 0.4 MG SL tablet Place 0.4 mg under the tongue every 5 (five) minutes as needed for chest pain.     pantoprazole (PROTONIX) 40 MG tablet Take 1 tablet (40 mg total) by mouth 2 (two) times daily. (Patient taking differently: Take 40 mg by mouth daily.) 60 tablet 5   Propylene Glycol (SYSTANE BALANCE) 0.6 % SOLN Place 1 drop into both eyes daily as needed (Dry eye).     SYRINGE-NEEDLE, DISP, 3 ML (BD ECLIPSE SYRINGE) 23G X 1" 3 ML MISC As directed IM 50 each 2   tamsulosin (FLOMAX) 0.4 MG CAPS capsule Take 0.4 mg by mouth daily.     testosterone cypionate (DEPOTESTOSTERONE CYPIONATE) 200 MG/ML injection INJECT 2ML INTO MUSCLE EVERY 2 WEEKS 12 mL 1   predniSONE (DELTASONE) 10 MG tablet Take 1 tablet (10 mg total) by mouth daily with breakfast. 30 tablet 3   apixaban (ELIQUIS) 5 MG TABS tablet Take 1 tablet (5 mg total) by mouth 2 (two) times daily for 7 days, THEN 1 tablet (5 mg total) 2 (two) times daily. 60 tablet 5   No facility-administered medications prior to visit.    ROS: Review of Systems  Constitutional:  Positive for fatigue. Negative for appetite change and unexpected weight change.  HENT:  Negative for congestion, nosebleeds, sneezing, sore throat and trouble swallowing.   Eyes:  Negative for itching and visual disturbance.  Respiratory:  Negative for cough.   Cardiovascular:  Negative for chest pain, palpitations and leg swelling.  Gastrointestinal:  Negative for abdominal distention, blood in stool, diarrhea and nausea.  Genitourinary:  Negative for frequency and hematuria.  Musculoskeletal:  Positive for arthralgias, back pain and gait problem. Negative for joint swelling and neck pain.  Skin:  Negative for rash.  Neurological:  Positive for weakness. Negative for dizziness, tremors and speech difficulty.  Psychiatric/Behavioral:  Positive for sleep disturbance. Negative for agitation and dysphoric mood. The patient is nervous/anxious.      Objective:  BP 132/72 (BP Location: Left Arm)   Pulse 66   Temp 98 F (36.7 C) (Oral)   Ht 6' (1.829 m)   Wt 189 lb 3.2 oz (85.8 kg)   SpO2 97%   BMI 25.66 kg/m   BP Readings from Last 3 Encounters:  09/20/22 132/72  08/11/22 120/62  07/26/22 (!) 182/91    Wt Readings from Last 3 Encounters:  09/20/22 189 lb 3.2 oz (85.8 kg)  08/11/22 192 lb 6.4 oz (87.3 kg)  07/26/22 186 lb 12.8 oz (84.7 kg)    Physical Exam Constitutional:      General: He is not in acute distress.    Appearance: Normal appearance. He is well-developed.     Comments: NAD  Eyes:     Conjunctiva/sclera: Conjunctivae normal.     Pupils: Pupils are equal, round, and reactive to light.  Neck:     Thyroid: No thyromegaly.     Vascular: No JVD.  Cardiovascular:     Rate and Rhythm: Normal rate and regular rhythm.     Heart sounds: Normal heart sounds. No murmur heard.    No friction rub. No gallop.  Pulmonary:     Effort: Pulmonary effort is normal. No respiratory distress.     Breath sounds: Normal breath sounds. No wheezing or rales.  Chest:     Chest wall: No tenderness.  Abdominal:     General: Bowel sounds are normal. There is no distension.     Palpations: Abdomen is soft. There is no mass.     Tenderness: There is no abdominal tenderness. There is no guarding or rebound.  Musculoskeletal:        General: No tenderness. Normal range of motion.     Cervical back: Normal range of motion.  Lymphadenopathy:     Cervical: No cervical adenopathy.  Skin:    General: Skin is warm and dry.     Findings: No rash.  Neurological:     Mental Status: He is alert and oriented to person, place, and time.     Cranial Nerves: No cranial nerve deficit.     Motor: No weakness or abnormal muscle tone.     Coordination: Coordination abnormal.     Gait: Gait abnormal.     Deep Tendon Reflexes: Reflexes are normal and symmetric.  Psychiatric:        Behavior: Behavior normal.        Thought Content:  Thought content normal.        Judgment: Judgment normal.   Right lumbar spine, right buttock are painful to palpation Straight leg elevation +/- on the right Limping.  Using a walking stick Antalgic gait  Lab Results  Component Value Date   WBC 6.7 07/05/2022   HGB 13.5 07/05/2022   HCT 41.6 07/05/2022  PLT 87.0 (L) 07/05/2022   GLUCOSE 130 (H) 07/05/2022   CHOL 143 06/25/2022   TRIG 88 06/25/2022   HDL 42 06/25/2022   LDLCALC 83 06/25/2022   ALT 21 07/05/2022   AST 16 07/05/2022   NA 141 07/05/2022   K 5.0 07/05/2022   CL 104 07/05/2022   CREATININE 1.87 (H) 07/05/2022   BUN 40 (H) 07/05/2022   CO2 32 07/05/2022   TSH 11.04 (H) 04/15/2021   PSA 2.58 07/05/2022   INR 1.3 (H) 06/23/2022   HGBA1C 6.0 12/28/2021   MICROALBUR 1.2 05/18/2017    VAS [place] IVC/ILIAC (VENOUS ONLY)  Result Date: 07/26/2022 IVC/ILIAC STUDY Patient Name:  JOSEY FORCIER  Date of Exam:   07/26/2022 Medical Rec #: [id number]      Accession #:    [id number] Date of Birth: July 12, 1941     Patient Gender: M Patient Age:   62 years Exam Location:  [facility] Procedure:      VAS [place] IVC/ILIAC (VENOUS ONLY) Referring Phys: Dagoberto Ligas --------------------------------------------------------------------------------  Indications: Follow up DVT/thrombectomy Vascular Interventions: 06/24/22: mechanical thrombectomy of left common iliac                         vein, left external iliac vein, left common femoral                         vein, left femoral vein.  Performing Technologist: Ralene Cork RVT  Examination Guidelines: A complete evaluation includes B-mode imaging, spectral Doppler, color Doppler, and power Doppler as needed of all accessible portions of each vessel. Bilateral testing is considered an integral part of a complete examination. Limited examinations for reoccurring indications may be performed as noted.  IVC/Iliac Findings: +----------+------+--------+--------+    IVC     PatentThrombusComments +----------+------+--------+--------+ IVC Prox  patent                 +----------+------+--------+--------+ IVC Mid   patent                 +----------+------+--------+--------+ IVC Distalpatent                 +----------+------+--------+--------+  +-------------------+---------+-----------+---------+-----------+--------+         CIV        RT-PatentRT-ThrombusLT-PatentLT-ThrombusComments +-------------------+---------+-----------+---------+-----------+--------+ Common Iliac Prox                       patent                      +-------------------+---------+-----------+---------+-----------+--------+ Common Iliac Mid                        patent                      +-------------------+---------+-----------+---------+-----------+--------+ Common Iliac Distal                     patent                      +-------------------+---------+-----------+---------+-----------+--------+  +-------------------------+---------+-----------+---------+-----------+--------+            EIV           RT-PatentRT-ThrombusLT-PatentLT-ThrombusComments +-------------------------+---------+-----------+---------+-----------+--------+ External Iliac Vein Prox                      patent                      +-------------------------+---------+-----------+---------+-----------+--------+  External Iliac Vein Mid                       patent                      +-------------------------+---------+-----------+---------+-----------+--------+ External Iliac Vein                           patent                      Distal                                                                    +-------------------------+---------+-----------+---------+-----------+--------+   Summary: IVC/Iliac: No evidence of DVT in the IVC, left CIV or EIV.  *See table(s) above for measurements and observations.  Electronically signed by Monica Martinez  MD on 07/26/2022 at 9:53:08 AM.    Final    VAS [place] LOWER EXTREMITY VENOUS (DVT)  Result Date: 07/26/2022  Lower Venous DVT Study Patient Name:  VASHAWN EKSTEIN  Date of Exam:   07/26/2022 Medical Rec #: [id number]      Accession #:    [id number] Date of Birth: [birth year]     Patient Gender: M Patient Age:   50 years Exam Location:  [facility] Procedure:      VAS [place] LOWER EXTREMITY VENOUS (DVT) Referring Phys: Dagoberto Ligas --------------------------------------------------------------------------------  Indications: Follow up ilio-femoral DVT/thrombectomy.  Risk Factors: Surgery 06/24/22: mechanical thrombectomy of left common iliac vein, left external iliac vein, left common femoral vein, left femoral vein. Anticoagulation: Eliquis. Comparison Study: 06/23/22: LEFT:                   - Findings consistent with acute deep vein thrombosis                   involving the external illiac vein, left common femoral vein,                   SF junction, left femoral vein, left proximal profunda vein,                   left popliteal vein, left posterior tibial veins, left                   peroneal veins, and left gastrocnemius veins. Performing Technologist: Ralene Cork RVT  Examination Guidelines: A complete evaluation includes B-mode imaging, spectral Doppler, color Doppler, and power Doppler as needed of all accessible portions of each vessel. Bilateral testing is considered an integral part of a complete examination. Limited examinations for reoccurring indications may be performed as noted. The reflux portion of the exam is performed with the patient in reverse Trendelenburg.  +-----+---------------+---------+-----------+----------+--------------+ RIGHTCompressibilityPhasicitySpontaneityPropertiesThrombus Aging +-----+---------------+---------+-----------+----------+--------------+ CFV  Full           Yes      Yes                                  +-----+---------------+---------+-----------+----------+--------------+ SFJ  Full  Yes                                 +-----+---------------+---------+-----------+----------+--------------+  +---------+---------------+---------+-----------+----------+--------------+ LEFT     CompressibilityPhasicitySpontaneityPropertiesThrombus Aging +---------+---------------+---------+-----------+----------+--------------+ CFV      Full           Yes      Yes                                 +---------+---------------+---------+-----------+----------+--------------+ SFJ      Full                    Yes                                 +---------+---------------+---------+-----------+----------+--------------+ FV Prox  None           No       No                   Continued      +---------+---------------+---------+-----------+----------+--------------+ FV Mid   None           No       No                   Continued      +---------+---------------+---------+-----------+----------+--------------+ FV DistalNone           No       No                   Continued      +---------+---------------+---------+-----------+----------+--------------+ PFV      Full                                                        +---------+---------------+---------+-----------+----------+--------------+ POP      None           No       No                                  +---------+---------------+---------+-----------+----------+--------------+ PTV      Partial                 Yes                  Continued      +---------+---------------+---------+-----------+----------+--------------+ PERO     Partial                 Yes                  Continued      +---------+---------------+---------+-----------+----------+--------------+ GSV      Full           Yes      Yes                                  +---------+---------------+---------+-----------+----------+--------------+     Summary: RIGHT: - No evidence of common femoral vein obstruction.  LEFT: - There is no evidence of superficial venous  thrombosis. - Continued DVT from the calf veins through to the confluence with the CFV.  *See table(s) above for measurements and observations. Electronically signed by Monica Martinez MD on 07/26/2022 at 9:52:52 AM.    Final     Assessment & Plan:   Problem List Items Addressed This Visit     Low back pain    L radiculopathic pain - piriformis syndrome Try TENS unit Tramadol prn      Relevant Medications   predniSONE (DELTASONE) 10 MG tablet   traMADol (ULTRAM) 50 MG tablet   Claudication (HCC) - Primary    LLE>RLE, new Check Duplex US On Lipitor      Relevant Medications   predniSONE (DELTASONE) 10 MG tablet   traMADol (ULTRAM) 50 MG tablet   Other Relevant Orders   US ARTERIAL LOWER EXTREMITY DUPLEX BILATERAL   Celiac disease    On gluten free diet         Meds ordered this encounter  Medications   predniSONE (DELTASONE) 10 MG tablet    Sig: Take 1-2 tablets (10-20 mg total) by mouth daily with breakfast.    Dispense:  60 tablet    Refill:  3   traMADol (ULTRAM) 50 MG tablet    Sig: Take 1-2 tablets (50-100 mg total) by mouth every 6 (six) hours as needed for severe pain.    Dispense:  120 tablet    Refill:  2   methylPREDNISolone acetate (DEPO-MEDROL) injection 80 mg      Follow-up: Return in about 4 months (around 01/19/2023) for a follow-up visit.  Walker Kehr, MD

## 2022-09-20 NOTE — Assessment & Plan Note (Addendum)
L radiculopathic pain - piriformis syndrome Try TENS unit Tramadol prn

## 2022-10-03 DIAGNOSIS — M545 Low back pain, unspecified: Secondary | ICD-10-CM | POA: Diagnosis not present

## 2022-10-06 ENCOUNTER — Encounter: Payer: Self-pay | Admitting: Internal Medicine

## 2022-10-06 DIAGNOSIS — I739 Peripheral vascular disease, unspecified: Secondary | ICD-10-CM

## 2022-10-09 ENCOUNTER — Other Ambulatory Visit: Payer: Self-pay | Admitting: Internal Medicine

## 2022-10-12 NOTE — Telephone Encounter (Signed)
-----   Message from Cassandria Anger, MD sent at 10/12/2022  7:15 AM EST ----- Regarding: Arterial Doppler ultrasound  Scott Oneal, has this order been processed?  I cannot tell. Thanks, AP ------------------------------------------------------------ [name], Your test was ordered on 09/20/2022. Thanks, ===View-only below this line===   ----- Message -----      From:Narek Darnelle Bos "[name]"      Sent:10/06/2022  2:38 PM EST        KI:SNGX Plotnikov, MD   Subject:us arterial imaging  on my last visit you said i needed my left leg arteries checked. they said downstairs that they did not do and someone would contact me.as of today i do not know where to go for this procedure. can you direct me on what to do?

## 2022-10-12 NOTE — Addendum Note (Signed)
Addended by: Earnstine Regal on: 10/12/2022 10:59 AM   Modules accepted: Orders

## 2022-10-12 NOTE — Telephone Encounter (Signed)
Will check w/ [facility] to see if test order correctly.Marland KitchenJohny Chess

## 2022-10-18 ENCOUNTER — Ambulatory Visit (HOSPITAL_COMMUNITY)
Admission: RE | Admit: 2022-10-18 | Discharge: 2022-10-18 | Disposition: A | Discharge status: Home / Self Care | Payer: PPO | Source: Ambulatory Visit | Attending: Cardiology | Admitting: Cardiology

## 2022-10-18 DIAGNOSIS — I739 Peripheral vascular disease, unspecified: Secondary | ICD-10-CM | POA: Insufficient documentation

## 2022-10-19 ENCOUNTER — Encounter (HOSPITAL_COMMUNITY): Payer: PPO

## 2022-10-20 ENCOUNTER — Other Ambulatory Visit: Payer: Self-pay | Admitting: Internal Medicine

## 2022-10-20 DIAGNOSIS — I739 Peripheral vascular disease, unspecified: Secondary | ICD-10-CM

## 2022-11-01 DIAGNOSIS — M5451 Vertebrogenic low back pain: Secondary | ICD-10-CM | POA: Diagnosis not present

## 2022-11-03 ENCOUNTER — Encounter: Payer: Self-pay | Admitting: Vascular Surgery

## 2022-11-03 ENCOUNTER — Ambulatory Visit (INDEPENDENT_AMBULATORY_CARE_PROVIDER_SITE_OTHER): Payer: PPO | Admitting: Vascular Surgery

## 2022-11-03 VITALS — BP 157/91 | HR 73 | Temp 98.2°F | Resp 20 | Ht 72.0 in | Wt 199.0 lb

## 2022-11-03 DIAGNOSIS — I87009 Postthrombotic syndrome without complications of unspecified extremity: Secondary | ICD-10-CM | POA: Diagnosis not present

## 2022-11-03 DIAGNOSIS — I872 Venous insufficiency (chronic) (peripheral): Secondary | ICD-10-CM | POA: Diagnosis not present

## 2022-11-03 NOTE — Progress Notes (Signed)
ASSESSMENT & PLAN   LEFT LEG PAIN: I think the pain in his left leg is multifactorial.  He does describe aching pain and heaviness which I think could be attributed to venous hypertension.  He has persistent chronic clot in the femoral vein down to the calf veins based on his previous duplex scan in September.  I think he should continue an entire year of Eliquis.  I have ordered a follow-up duplex scan in August 2024 we can make a decision at that time whether or not to continue his Eliquis.  In the meantime we have discussed the importance of leg elevation and the proper positioning for this.  I have encouraged him to continue to wear his knee-high compression stockings.  We discussed the importance of exercise specifically walking and water aerobics.  I encouraged him to avoid prolonged sitting and standing.  I did reassure him that he has no evidence of significant arterial insufficiency.  He has multiphasic signals in both feet and no evidence of significant arterial insufficiency.  I think some of his pain could be neurogenic in origin as he has tingling and paresthesias in the left leg in addition to back pain.  If this worsens he may need evaluation by the back surgeons.  We will see him back in August with a follow-up duplex scan.  He knows to call sooner if he has problems.   REASON FOR CONSULT:    Left leg pain.  The patient is referred by Dr. Alain Marion.    HPI:   Scott Oneal is a 82 y.o. male who fell back in August and subsequently developed a DVT in the left leg.  He underwent mechanical thrombectomy of an iliofemoral DVT left lower extremity DVT by Dr. Stanford Breed on 06/24/2022.  Subsequent duplex scan on 07/26/2022 showed resolution of his iliofemoral DVT.  He did have some persistent clot in the left femoral vein down to the calf.  He has been maintained on Eliquis since 06/24/2022.  Today he has multiple complaints in the left leg.  He does describe some aching pain and heaviness  in the leg which is aggravated by standing and relieved with elevation.  The symptoms would be consistent with venous hypertension and postphlebitic syndrome.  He also describes some paresthesias in the left leg and tingling sensation at times and does have a history of back pain.  Thus this could be neurogenic in origin.  I do not get any history of claudication, rest pain, or nonhealing ulcers to suggest arterial insufficiency.  He is on a statin.  He is on Eliquis.  Because he is on Eliquis he is not on aspirin.  He does wear knee-high compression stockings with a gradient of 20 to 30 mmHg.  This does help his symptoms.  Past Medical History:  Diagnosis Date   Allergy    Arthritis    Calculus of gallbladder without mention of cholecystitis    Cataract    bilateral cateracts removed   Celiac disease    Chronic kidney disease    stage 3   Coronary atherosclerosis of unspecified type of vessel, native or graft    Dermatitis 06/08/2021   Erythema of lower extremity 09/08/2021   Esophageal reflux    Glaucoma    History of cellulitis 09/08/2021   History of kidney stones    Loss of weight    Lower extremity weakness 05/31/2021   Macular degeneration    Rt eye . injections   Old myocardial  infarction    Osteoporosis, unspecified    Other malaise and fatigue    Other specified cardiac dysrhythmias(427.89)    Other testicular hypofunction    Pancreatic insufficiency 05/31/2021   Personal history of colonic polyps    Postural dizziness with presyncope 12/13/2021   Pure hypercholesterolemia    Thrombocytopenia (HCC)    chronic, per PCP notes dating back to 2009   Tinea pedis 05/31/2021   Unspecified asthma(493.90)    Unspecified essential hypertension    Unspecified hypothyroidism    Venous stasis dermatitis 09/08/2021    Family History  Problem Relation Age of Onset   Lymphoma Father    Hypertension Other    Vision loss Maternal Grandmother    Colon cancer Son 96   Asthma  Neg Hx    Esophageal cancer Neg Hx    Rectal cancer Neg Hx    Stomach cancer Neg Hx    Colon polyps Neg Hx     SOCIAL HISTORY: Social History   Tobacco Use   Smoking status: Never    Passive exposure: Never   Smokeless tobacco: Never  Substance Use Topics   Alcohol use: No    Allergies  Allergen Reactions   Biaxin [Clarithromycin]     Dizziness   Tizanidine     Felt funny   Allantoin-Pramoxine Other (See Comments)    Pt does't remember reaction   Amlodipine Swelling    Edema    Amoxicillin Rash    Rash on arms that developed towards end of 7 day treatment   Benzalkonium Chloride Itching and Rash   Doxycycline Photosensitivity and Rash   Metoprolol Tartrate Other (See Comments)    REACTION: fatigue    Current Outpatient Medications  Medication Sig Dispense Refill   acetaminophen (TYLENOL) 500 MG tablet Take 1 tablet (500 mg total) by mouth every 6 (six) hours as needed for up to 30 doses. 30 tablet 0   atorvastatin (LIPITOR) 80 MG tablet Take 1 tablet (80 mg total) by mouth at bedtime. 30 tablet 11   calcitRIOL (ROCALTROL) 0.25 MCG capsule TAKE 1 CAPSULE BY MOUTH EVERY DAY 90 capsule 1   carvedilol (COREG) 25 MG tablet Take 12.5 mg by mouth 2 (two) times daily with a meal.     cetirizine (ZYRTEC) 10 MG tablet Take 10 mg by mouth daily as needed for allergies.     Cholecalciferol 2000 UNITS TABS Take 2,000 Units by mouth daily.     clonazePAM (KLONOPIN) 0.25 MG disintegrating tablet TAKE 1 TABLET (0.25 MG TOTAL) BY MOUTH 2 (TWO) TIMES DAILY AS NEEDED (ANXIETY). 60 tablet 3   diclofenac sodium (VOLTAREN) 1 % GEL APPLY 4 G TOPICALLY 4 TIMES DAILY. (Patient taking differently: Apply 4 g topically 4 (four) times daily as needed (pain).) 200 g 5   hydrALAZINE (APRESOLINE) 10 MG tablet Take 1-2 tablet 3 times a day as needed if blood pressure is >170 60 tablet 3   isosorbide mononitrate (IMDUR) 30 MG 24 hr tablet TAKE 1 TABLET BY MOUTH EVERY DAY 90 tablet 2   levothyroxine  (SYNTHROID) 175 MCG tablet TAKE 1 TABLET BY MOUTH EVERY DAY 90 tablet 3   lipase/protease/amylase (CREON) 36000 UNITS CPEP capsule Take 2 capsules (72,000 Units total) by mouth 3 (three) times daily with meals. May also take 1 capsule (36,000 Units total) as needed (with snacks). 240 capsule 11   Multiple Vitamins-Minerals (PRESERVISION AREDS 2) CAPS Take 1 capsule by mouth in the morning and at bedtime.  nitroGLYCERIN (NITROSTAT) 0.4 MG SL tablet Place 0.4 mg under the tongue every 5 (five) minutes as needed for chest pain.     pantoprazole (PROTONIX) 40 MG tablet Take 1 tablet (40 mg total) by mouth 2 (two) times daily. (Patient taking differently: Take 40 mg by mouth daily.) 60 tablet 5   predniSONE (DELTASONE) 10 MG tablet Take 1-2 tablets (10-20 mg total) by mouth daily with breakfast. 60 tablet 3   Propylene Glycol (SYSTANE BALANCE) 0.6 % SOLN Place 1 drop into both eyes daily as needed (Dry eye).     SYRINGE-NEEDLE, DISP, 3 ML (BD ECLIPSE SYRINGE) 23G X 1" 3 ML MISC As directed IM 50 each 2   tamsulosin (FLOMAX) 0.4 MG CAPS capsule Take 0.4 mg by mouth daily.     testosterone cypionate (DEPOTESTOSTERONE CYPIONATE) 200 MG/ML injection INJECT 2ML INTO MUSCLE EVERY 2 WEEKS 12 mL 1   traMADol (ULTRAM) 50 MG tablet Take 1-2 tablets (50-100 mg total) by mouth every 6 (six) hours as needed for severe pain. 120 tablet 2   apixaban (ELIQUIS) 5 MG TABS tablet Take 1 tablet (5 mg total) by mouth 2 (two) times daily for 7 days, THEN 1 tablet (5 mg total) 2 (two) times daily. 60 tablet 5   No current facility-administered medications for this visit.    REVIEW OF SYSTEMS:  '[X]'$  denotes positive finding, '[ ]'$  denotes negative finding Cardiac  Comments:  Chest pain or chest pressure:    Shortness of breath upon exertion:    Short of breath when lying flat:    Irregular heart rhythm:        Vascular    Pain in calf, thigh, or hip brought on by ambulation: x   Pain in feet at night that wakes you up  from your sleep:     Blood clot in your veins:    Leg swelling:  x       Pulmonary    Oxygen at home:    Productive cough:     Wheezing:         Neurologic    Sudden weakness in arms or legs:     Sudden numbness in arms or legs:     Sudden onset of difficulty speaking or slurred speech:    Temporary loss of vision in one eye:     Problems with dizziness:         Gastrointestinal    Blood in stool:     Vomited blood:         Genitourinary    Burning when urinating:     Blood in urine:        Psychiatric    Major depression:         Hematologic    Bleeding problems:    Problems with blood clotting too easily:        Skin    Rashes or ulcers:        Constitutional    Fever or chills:    -  PHYSICAL EXAM:   Vitals:   11/03/22 0926  BP: (!) 157/91  Pulse: 73  Resp: 20  Temp: 98.2 F (36.8 C)  SpO2: 98%  Weight: 199 lb (90.3 kg)  Height: 6' (1.829 m)   Body mass index is 26.99 kg/m.  GENERAL: The patient is a well-nourished male, in no acute distress. The vital signs are documented above. CARDIAC: There is a regular rate and rhythm.  VASCULAR: I do not detect carotid bruits. On  the right side he has a palpable femoral pulse.  I cannot palpate pedal pulses.  He does have multiphasic Doppler signals in the right foot. On the left side, which is the symptomatic side, he has a palpable femoral pulse and palpable dorsalis pedis pulse. He has mild left lower extremity swelling and also some hyperpigmentation consistent with chronic venous insufficiency. PULMONARY: There is good air exchange bilaterally without wheezing or rales. ABDOMEN: Soft and non-tender with normal pitched bowel sounds.  MUSCULOSKELETAL: There are no major deformities. NEUROLOGIC: No focal weakness or paresthesias are detected. SKIN: There are no ulcers or rashes noted. PSYCHIATRIC: The patient has a normal affect.  DATA:    ARTERIAL DOPPLER STUDY: I have reviewed his arterial Doppler  study that was done on 10/18/2022.  On the right side there was a multiphasic posterior tibial, dorsalis pedis, and peroneal signal and ABI could not be calculated as the arteries were calcified.  Toe pressure on the right was 62 mmHg.  On the left side there was a multiphasic posterior tibial dorsalis pedis and peroneal signal.  Again the arteries were calcified and ABI could not be obtained.  Toe pressure was 59 mmHg.  IVC ILIAC VENOUS DUPLEX: I reviewed the duplex that was done on 07/26/2022.  This showed no evidence of DVT in the IVC, left common iliac vein, or left external iliac vein.  LEFT LOWER EXTREMITY VENOUS DUPLEX: Reviewed the lower extremity venous duplex scan that was done on 07/26/2022.  There was clot in the left lower extremity from the calf veins extending up to the common femoral vein.  Deitra Mayo [facility]

## 2022-11-04 DIAGNOSIS — M5136 Other intervertebral disc degeneration, lumbar region: Secondary | ICD-10-CM | POA: Diagnosis not present

## 2022-11-08 ENCOUNTER — Other Ambulatory Visit: Payer: Self-pay

## 2022-11-08 DIAGNOSIS — I82422 Acute embolism and thrombosis of left iliac vein: Secondary | ICD-10-CM

## 2022-11-16 DIAGNOSIS — M5416 Radiculopathy, lumbar region: Secondary | ICD-10-CM | POA: Diagnosis not present

## 2022-11-21 NOTE — Progress Notes (Signed)
Fax received from [facility] @ [facility] for medical clearance/medication hold for lumbar ESI to be signed by Dr. Scot Dock .  Provider signed, form faxed back to sender, verified successful, sent to scan center.

## 2022-11-28 DIAGNOSIS — N183 Chronic kidney disease, stage 3 unspecified: Secondary | ICD-10-CM | POA: Diagnosis not present

## 2022-11-28 DIAGNOSIS — I129 Hypertensive chronic kidney disease with stage 1 through stage 4 chronic kidney disease, or unspecified chronic kidney disease: Secondary | ICD-10-CM | POA: Diagnosis not present

## 2022-11-28 DIAGNOSIS — R319 Hematuria, unspecified: Secondary | ICD-10-CM | POA: Diagnosis not present

## 2022-11-28 DIAGNOSIS — N320 Bladder-neck obstruction: Secondary | ICD-10-CM | POA: Diagnosis not present

## 2022-11-28 DIAGNOSIS — Z6841 Body Mass Index (BMI) 40.0 and over, adult: Secondary | ICD-10-CM | POA: Diagnosis not present

## 2022-12-02 DIAGNOSIS — M5136 Other intervertebral disc degeneration, lumbar region: Secondary | ICD-10-CM | POA: Diagnosis not present

## 2022-12-02 DIAGNOSIS — M48062 Spinal stenosis, lumbar region with neurogenic claudication: Secondary | ICD-10-CM | POA: Diagnosis not present

## 2022-12-02 DIAGNOSIS — M792 Neuralgia and neuritis, unspecified: Secondary | ICD-10-CM | POA: Diagnosis not present

## 2022-12-15 ENCOUNTER — Other Ambulatory Visit: Payer: Self-pay | Admitting: Internal Medicine

## 2022-12-22 DIAGNOSIS — M5416 Radiculopathy, lumbar region: Secondary | ICD-10-CM | POA: Diagnosis not present

## 2022-12-26 ENCOUNTER — Encounter: Payer: Self-pay | Admitting: Internal Medicine

## 2022-12-26 ENCOUNTER — Ambulatory Visit (INDEPENDENT_AMBULATORY_CARE_PROVIDER_SITE_OTHER): Payer: PPO | Admitting: Internal Medicine

## 2022-12-26 ENCOUNTER — Ambulatory Visit: Payer: PPO | Admitting: Infectious Disease

## 2022-12-26 VITALS — BP 130/82 | HR 61 | Temp 98.6°F | Ht 72.0 in | Wt 187.0 lb

## 2022-12-26 DIAGNOSIS — M48062 Spinal stenosis, lumbar region with neurogenic claudication: Secondary | ICD-10-CM

## 2022-12-26 DIAGNOSIS — M5386 Other specified dorsopathies, lumbar region: Secondary | ICD-10-CM | POA: Diagnosis not present

## 2022-12-26 DIAGNOSIS — M353 Polymyalgia rheumatica: Secondary | ICD-10-CM

## 2022-12-26 DIAGNOSIS — F411 Generalized anxiety disorder: Secondary | ICD-10-CM | POA: Diagnosis not present

## 2022-12-26 DIAGNOSIS — M5442 Lumbago with sciatica, left side: Secondary | ICD-10-CM | POA: Diagnosis not present

## 2022-12-26 DIAGNOSIS — R269 Unspecified abnormalities of gait and mobility: Secondary | ICD-10-CM | POA: Diagnosis not present

## 2022-12-26 DIAGNOSIS — G8929 Other chronic pain: Secondary | ICD-10-CM

## 2022-12-26 MED ORDER — CLONAZEPAM 0.5 MG PO TABS: 0.5000 mg | ORAL_TABLET | Freq: Two times a day (BID) | ORAL | 3 refills | Status: DC | PRN

## 2022-12-26 MED ORDER — HYDROCODONE-ACETAMINOPHEN 7.5-325 MG PO TABS: 1.0000 | ORAL_TABLET | Freq: Four times a day (QID) | ORAL | 0 refills | Status: DC | PRN

## 2022-12-26 NOTE — Assessment & Plan Note (Signed)
Worse Klonopin prn w/caution  Potential benefits of a long term benzodiazepines  use as well as potential risks  and complications were explained to the patient and were aknowledged. Do not take w/Norco

## 2022-12-26 NOTE — Progress Notes (Signed)
Subjective:  Patient ID: Scott Oneal, male    DOB: 1940-12-26  Age: 82 y.o. MRN: [id number]  CC: Anxiety (Shakes and nervousness)   HPI Scott Oneal presents for anxiety - worse.  Scott Oneal is on Humalog - she is upset about her sugars Scott Oneal found his old Clonazepam - it helped.  C/o LBP - LLE sciatica is worse after raking leaves in January, pain is 10/10 at times; he went to [facility] - he had an MRI and had an injection. Pt saw Dr Scott Oneal, NS. Tramadol is not helping at 100 mg bid dose.   Outpatient Medications Prior to Visit  Medication Sig Dispense Refill   acetaminophen (TYLENOL) 500 MG tablet Take 1 tablet (500 mg total) by mouth every 6 (six) hours as needed for up to 30 doses. 30 tablet 0   atorvastatin (LIPITOR) 80 MG tablet Take 1 tablet (80 mg total) by mouth at bedtime. 30 tablet 11   calcitRIOL (ROCALTROL) 0.25 MCG capsule TAKE 1 CAPSULE BY MOUTH EVERY DAY 90 capsule 1   carvedilol (COREG) 25 MG tablet Take 12.5 mg by mouth 2 (two) times daily with a meal.     cetirizine (ZYRTEC) 10 MG tablet Take 10 mg by mouth daily as needed for allergies.     Cholecalciferol 2000 UNITS TABS Take 2,000 Units by mouth daily.     diclofenac sodium (VOLTAREN) 1 % GEL APPLY 4 G TOPICALLY 4 TIMES DAILY. (Patient taking differently: Apply 4 g topically 4 (four) times daily as needed (pain).) 200 g 5   hydrALAZINE (APRESOLINE) 10 MG tablet Take 1-2 tablet 3 times a day as needed if blood pressure is >170 60 tablet 3   isosorbide mononitrate (IMDUR) 30 MG 24 hr tablet TAKE 1 TABLET BY MOUTH EVERY DAY 90 tablet 2   levothyroxine (SYNTHROID) 175 MCG tablet TAKE 1 TABLET BY MOUTH EVERY DAY 90 tablet 3   lipase/protease/amylase (CREON) 36000 UNITS CPEP capsule Take 2 capsules (72,000 Units total) by mouth 3 (three) times daily with meals. May also take 1 capsule (36,000 Units total) as needed (with snacks). 240 capsule 11   Multiple Vitamins-Minerals (PRESERVISION AREDS 2) CAPS Take 1 capsule by  mouth in the morning and at bedtime.     nitroGLYCERIN (NITROSTAT) 0.4 MG SL tablet Place 0.4 mg under the tongue every 5 (five) minutes as needed for chest pain.     pantoprazole (PROTONIX) 40 MG tablet TAKE 1 TABLET BY MOUTH TWICE A DAY 180 tablet 1   predniSONE (DELTASONE) 10 MG tablet Take 1-2 tablets (10-20 mg total) by mouth daily with breakfast. 60 tablet 3   Propylene Glycol (SYSTANE BALANCE) 0.6 % SOLN Place 1 drop into both eyes daily as needed (Dry eye).     SYRINGE-NEEDLE, DISP, 3 ML (BD ECLIPSE SYRINGE) 23G X 1" 3 ML MISC As directed IM 50 each 2   tamsulosin (FLOMAX) 0.4 MG CAPS capsule Take 0.4 mg by mouth daily.     testosterone cypionate (DEPOTESTOSTERONE CYPIONATE) 200 MG/ML injection INJECT 2ML INTO MUSCLE EVERY 2 WEEKS 12 mL 1   clonazePAM (KLONOPIN) 0.25 MG disintegrating tablet TAKE 1 TABLET (0.25 MG TOTAL) BY MOUTH 2 (TWO) TIMES DAILY AS NEEDED (ANXIETY). 60 tablet 3   traMADol (ULTRAM) 50 MG tablet Take 1-2 tablets (50-100 mg total) by mouth every 6 (six) hours as needed for severe pain. 120 tablet 2   apixaban (ELIQUIS) 5 MG TABS tablet Take 1 tablet (5 mg total) by mouth 2 (two) times  daily for 7 days, THEN 1 tablet (5 mg total) 2 (two) times daily. 60 tablet 5   No facility-administered medications prior to visit.    ROS: Review of Systems  Constitutional:  Positive for fatigue. Negative for appetite change and unexpected weight change.  HENT:  Negative for congestion, nosebleeds, sneezing, sore throat and trouble swallowing.   Eyes:  Negative for itching and visual disturbance.  Respiratory:  Negative for cough.   Cardiovascular:  Negative for chest pain, palpitations and leg swelling.  Gastrointestinal:  Negative for abdominal distention, blood in stool, diarrhea and nausea.  Genitourinary:  Negative for frequency and hematuria.  Musculoskeletal:  Positive for arthralgias, back pain and gait problem. Negative for joint swelling and neck pain.  Skin:  Negative for  rash.  Neurological:  Positive for weakness. Negative for dizziness, tremors and speech difficulty.  Psychiatric/Behavioral:  Negative for agitation, decreased concentration, dysphoric mood, sleep disturbance and suicidal ideas. The patient is nervous/anxious.     Objective:  BP 130/82 (BP Location: Left Arm, Patient Position: Sitting, Cuff Size: Normal)   Pulse 61   Temp 98.6 F (37 C) (Oral)   Ht 6' (1.829 m)   Wt 187 lb (84.8 kg)   SpO2 98%   BMI 25.36 kg/m   BP Readings from Last 3 Encounters:  12/26/22 130/82  11/03/22 (!) 157/91  09/20/22 132/72    Wt Readings from Last 3 Encounters:  12/26/22 187 lb (84.8 kg)  11/03/22 199 lb (90.3 kg)  09/20/22 189 lb 3.2 oz (85.8 kg)    Physical Exam Constitutional:      General: He is not in acute distress.    Appearance: He is well-developed. He is obese.     Comments: NAD  Eyes:     Conjunctiva/sclera: Conjunctivae normal.     Pupils: Pupils are equal, round, and reactive to light.  Neck:     Thyroid: No thyromegaly.     Vascular: No JVD.  Cardiovascular:     Rate and Rhythm: Normal rate and regular rhythm.     Heart sounds: Normal heart sounds. No murmur heard.    No friction rub. No gallop.  Pulmonary:     Effort: Pulmonary effort is normal. No respiratory distress.     Breath sounds: Normal breath sounds. No wheezing or rales.  Chest:     Chest wall: No tenderness.  Abdominal:     General: Bowel sounds are normal. There is no distension.     Palpations: Abdomen is soft. There is no mass.     Tenderness: There is no abdominal tenderness. There is no guarding or rebound.  Musculoskeletal:        General: Tenderness present. Normal range of motion.     Cervical back: Normal range of motion.     Right lower leg: No edema.     Left lower leg: Edema present.  Lymphadenopathy:     Cervical: No cervical adenopathy.  Skin:    General: Skin is warm and dry.     Findings: Rash present.  Neurological:     Mental  Status: He is alert and oriented to person, place, and time.     Cranial Nerves: No cranial nerve deficit.     Motor: Weakness present. No abnormal muscle tone.     Coordination: Coordination normal.     Gait: Gait abnormal.     Deep Tendon Reflexes: Reflexes abnormal.  Psychiatric:        Behavior: Behavior normal.  Thought Content: Thought content normal.        Judgment: Judgment normal.  LLE w/discoloration patches Limping; L foot drop; antalgic gait In pain Mild tremor    A total time of 45 minutes was spent preparing to see the patient, reviewing tests, x-rays, operative reports and other medical records.  Also, obtaining history and performing comprehensive physical exam.  Additionally, counseling the patient regarding the above listed issues.   Finally, documenting clinical information in the health records, coordination of care, educating the patient - fall risk, sciatica, anticoagulation. It is a complex case.  Lab Results  Component Value Date   WBC 6.7 07/05/2022   HGB 13.5 07/05/2022   HCT 41.6 07/05/2022   PLT 87.0 (L) 07/05/2022   GLUCOSE 130 (H) 07/05/2022   CHOL 143 06/25/2022   TRIG 88 06/25/2022   HDL 42 06/25/2022   LDLCALC 83 06/25/2022   ALT 21 07/05/2022   AST 16 07/05/2022   NA 141 07/05/2022   K 5.0 07/05/2022   CL 104 07/05/2022   CREATININE 1.87 (H) 07/05/2022   BUN 40 (H) 07/05/2022   CO2 32 07/05/2022   TSH 11.04 (H) 04/15/2021   PSA 2.58 07/05/2022   INR 1.3 (H) 06/23/2022   HGBA1C 6.0 12/28/2021   MICROALBUR 1.2 05/18/2017    VAS [place] ABI WITH/WO TBI  Result Date: 10/19/2022  LOWER EXTREMITY DOPPLER STUDY Patient Name:  CALDER JANIGA  Date of Exam:   10/18/2022 Medical Rec #: [id number]      Accession #:    [id number] Date of Birth: [birth year]     Patient Gender: M Patient Age:   48 years Exam Location:  [facility] Procedure:      VAS [place] ABI WITH/WO TBI Referring Phys: Tyrone Apple Yarel Kilcrease  --------------------------------------------------------------------------------  Indications: Patient presents with painful bilateral leg tingling. He feels it              is worse with walking. 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 He still takes              Eliquis and wears compression stockings. High Risk Factors: Hypertension, hyperlipidemia, no history of smoking, prior                    MI, coronary artery disease.  Comparison Study: Previous ABIs performed 09/14/16 were 1.10 on the right and                   1.08 on the left. Performing Technologist: Mariane Masters RVT  Examination Guidelines: A complete evaluation includes at minimum, Doppler waveform signals and systolic blood pressure reading at the level of bilateral brachial, anterior tibial, and posterior tibial arteries, when vessel segments are accessible. Bilateral testing is considered an integral part of a complete examination. Photoelectric Plethysmograph (PPG) waveforms and toe systolic pressure readings are included as required and additional duplex testing as needed. Limited examinations for reoccurring indications may be performed as noted.  ABI Findings: +---------+------------------+-----+-----------+--------+ Right    Rt Pressure (mmHg)IndexWaveform   Comment  +---------+------------------+-----+-----------+--------+ Brachial 143                                        +---------+------------------+-----+-----------+--------+ PTA      254  1.78 multiphasic         +---------+------------------+-----+-----------+--------+ PERO     254               1.78 triphasic           +---------+------------------+-----+-----------+--------+ DP       254               1.78 multiphasic         +---------+------------------+-----+-----------+--------+ Great Toe62                0.43 Abnormal             +---------+------------------+-----+-----------+--------+ +---------+------------------+-----+-----------+-------+ Left     Lt Pressure (mmHg)IndexWaveform   Comment +---------+------------------+-----+-----------+-------+ Brachial 142                                       +---------+------------------+-----+-----------+-------+ PTA      254               1.78 biphasic           +---------+------------------+-----+-----------+-------+ PERO     254               1.78 triphasic          +---------+------------------+-----+-----------+-------+ DP       254               1.78 multiphasic        +---------+------------------+-----+-----------+-------+ Great Toe59                0.41 Abnormal           +---------+------------------+-----+-----------+-------+ +-------+-----------+-----------+------------+------------+ ABI/TBIToday's ABIToday's TBIPrevious ABIPrevious TBI +-------+-----------+-----------+------------+------------+ Right  [place]         0.43       1.10        1.05         +-------+-----------+-----------+------------+------------+ Left            0.41       1.08        1.01         +-------+-----------+-----------+------------+------------+  Arterial wall calcification precludes accurate ankle pressures and ABIs. Doppler waveforms demonstrate brisk, multiphasic flow consistent with no evidence of significant arterial occlusive disease.  Summary: Right: Resting right ankle-brachial index indicates noncompressible right lower extremity arteries. The right toe-brachial index is abnormal. Left: Resting left ankle-brachial index indicates noncompressible left lower extremity arteries. The left toe-brachial index is abnormal. *See table(s) above for measurements and observations.  Electronically signed by Kathlyn Sacramento MD on 10/19/2022 at 11:26:39 AM.    Final     Assessment & Plan:   Problem List Items Addressed This Visit       Nervous and Auditory    Sciatica associated with disorder of lumbar spine    LBP - LLE sciatica is worse after raking leaves in January, pain is 10/10 at times; he went to [facility] - he had an MRI and had an injection. Pt saw Dr Scott Oneal, NS. Tramadol is not helping at 100 mg bid dose.  Worse L sciatica - start PT  Potential benefits of a long term benzodiazepines  use as well as potential risks  and complications were explained to the patient and were aknowledged. Norco prn  Potential benefits of a long term opioids use as well as potential risks (i.e. addiction risk, apnea etc) and complications (i.e. Somnolence, constipation and others) were explained to the  patient and were aknowledged. Do not take w/Norco F/u w/Dr Scott Oneal      Relevant Medications   clonazePAM (KLONOPIN) 0.5 MG tablet   Other Relevant Orders   Ambulatory referral to Physical Therapy     Other   Spinal stenosis of lumbar region    Worse L sciatica - start PT  Potential benefits of a long term benzodiazepines  use as well as potential risks  and complications were explained to the patient and were aknowledged. Norco prn  Potential benefits of a long term opioids use as well as potential risks (i.e. addiction risk, apnea etc) and complications (i.e. Somnolence, constipation and others) were explained to the patient and were aknowledged. Do not take w/Norco F/u w/Dr Scott Oneal      PMR (polymyalgia rheumatica) (HCC)    On Prednisone 10 mg/d pc      Low back pain - Primary   Relevant Medications   HYDROcodone-acetaminophen (NORCO) 7.5-325 MG tablet   Other Relevant Orders   Ambulatory referral to Physical Therapy   Gait disorder   Relevant Orders   Ambulatory referral to Physical Therapy   Anxiety disorder    Worse Klonopin prn w/caution  Potential benefits of a long term benzodiazepines  use as well as potential risks  and complications were explained to the patient and were aknowledged. Do not take w/Norco         Meds ordered  this encounter  Medications   clonazePAM (KLONOPIN) 0.5 MG tablet    Sig: Take 1 tablet (0.5 mg total) by mouth 2 (two) times daily as needed for anxiety.    Dispense:  60 tablet    Refill:  3   HYDROcodone-acetaminophen (NORCO) 7.5-325 MG tablet    Sig: Take 1 tablet by mouth every 6 (six) hours as needed for moderate pain.    Dispense:  20 tablet    Refill:  0      Follow-up: Return in about 3 weeks (around 01/16/2023).  Walker Kehr, MD

## 2022-12-26 NOTE — Assessment & Plan Note (Addendum)
LBP - LLE sciatica is worse after raking leaves in January, pain is 10/10 at times; he went to [facility] - he had an MRI and had an injection. Pt saw Dr Ronnald Ramp, NS. Tramadol is not helping at 100 mg bid dose.  Worse L sciatica - start PT  Potential benefits of a long term benzodiazepines  use as well as potential risks  and complications were explained to the patient and were aknowledged. Norco prn  Potential benefits of a long term opioids use as well as potential risks (i.e. addiction risk, apnea etc) and complications (i.e. Somnolence, constipation and others) were explained to the patient and were aknowledged. Do not take w/Norco F/u w/Dr Ronnald Ramp

## 2022-12-26 NOTE — Assessment & Plan Note (Signed)
Worse L sciatica - start PT  Potential benefits of a long term benzodiazepines  use as well as potential risks  and complications were explained to the patient and were aknowledged. Norco prn  Potential benefits of a long term opioids use as well as potential risks (i.e. addiction risk, apnea etc) and complications (i.e. Somnolence, constipation and others) were explained to the patient and were aknowledged. Do not take w/Norco F/u w/Dr Ronnald Ramp

## 2022-12-26 NOTE — Assessment & Plan Note (Signed)
On Prednisone 10 mg/d pc

## 2022-12-29 ENCOUNTER — Encounter: Payer: Self-pay | Admitting: Internal Medicine

## 2022-12-31 ENCOUNTER — Other Ambulatory Visit: Payer: Self-pay | Admitting: Internal Medicine

## 2023-01-01 ENCOUNTER — Other Ambulatory Visit: Payer: Self-pay | Admitting: Internal Medicine

## 2023-01-01 MED ORDER — TRAMADOL HCL 50 MG PO TABS: 50.0000 mg | ORAL_TABLET | Freq: Four times a day (QID) | ORAL | 1 refills | Status: DC | PRN

## 2023-01-05 ENCOUNTER — Telehealth: Payer: Self-pay | Admitting: Internal Medicine

## 2023-01-05 NOTE — Telephone Encounter (Signed)
Patient dropped off document Surgical Clearance from [facility], to be filled out by provider. Patient requested to send it via Fax within 2-days. Document is located in providers tray at front office.Please advise at Mobile [phone] (mobile)   Please fax to: [phone]

## 2023-01-07 ENCOUNTER — Other Ambulatory Visit: Payer: Self-pay | Admitting: Internal Medicine

## 2023-01-09 NOTE — Telephone Encounter (Signed)
Okay. Thank you.

## 2023-01-10 DIAGNOSIS — M5416 Radiculopathy, lumbar region: Secondary | ICD-10-CM | POA: Diagnosis not present

## 2023-01-11 DIAGNOSIS — H353122 Nonexudative age-related macular degeneration, left eye, intermediate dry stage: Secondary | ICD-10-CM | POA: Diagnosis not present

## 2023-01-11 DIAGNOSIS — H353211 Exudative age-related macular degeneration, right eye, with active choroidal neovascularization: Secondary | ICD-10-CM | POA: Diagnosis not present

## 2023-01-11 DIAGNOSIS — H04012 Acute dacryoadenitis, left lacrimal gland: Secondary | ICD-10-CM | POA: Diagnosis not present

## 2023-01-12 ENCOUNTER — Ambulatory Visit (INDEPENDENT_AMBULATORY_CARE_PROVIDER_SITE_OTHER): Payer: PPO | Admitting: Internal Medicine

## 2023-01-12 ENCOUNTER — Encounter: Payer: Self-pay | Admitting: Internal Medicine

## 2023-01-12 VITALS — BP 120/80 | HR 96 | Temp 98.6°F | Ht 72.0 in | Wt 189.0 lb

## 2023-01-12 DIAGNOSIS — R202 Paresthesia of skin: Secondary | ICD-10-CM | POA: Diagnosis not present

## 2023-01-12 DIAGNOSIS — G250 Essential tremor: Secondary | ICD-10-CM

## 2023-01-12 DIAGNOSIS — E785 Hyperlipidemia, unspecified: Secondary | ICD-10-CM | POA: Diagnosis not present

## 2023-01-12 DIAGNOSIS — I2510 Atherosclerotic heart disease of native coronary artery without angina pectoris: Secondary | ICD-10-CM | POA: Diagnosis not present

## 2023-01-12 DIAGNOSIS — I82422 Acute embolism and thrombosis of left iliac vein: Secondary | ICD-10-CM | POA: Diagnosis not present

## 2023-01-12 DIAGNOSIS — Z9861 Coronary angioplasty status: Secondary | ICD-10-CM | POA: Diagnosis not present

## 2023-01-12 DIAGNOSIS — K900 Celiac disease: Secondary | ICD-10-CM | POA: Diagnosis not present

## 2023-01-12 LAB — COMPREHENSIVE METABOLIC PANEL
ALT: 31 U/L (ref 0–53)
AST: 31 U/L (ref 0–37)
Albumin: 3.6 g/dL (ref 3.5–5.2)
Alkaline Phosphatase: 100 U/L (ref 39–117)
BUN: 45 mg/dL — ABNORMAL HIGH (ref 6–23)
CO2: 33 mEq/L — ABNORMAL HIGH (ref 19–32)
Calcium: 9.8 mg/dL (ref 8.4–10.5)
Chloride: 103 mEq/L (ref 96–112)
Creatinine, Ser: 2.06 mg/dL — ABNORMAL HIGH (ref 0.40–1.50)
GFR: 29.7 mL/min — ABNORMAL LOW (ref >60.00)
Glucose, Bld: 126 mg/dL — ABNORMAL HIGH (ref 70–99)
Potassium: 5.3 mEq/L — ABNORMAL HIGH (ref 3.5–5.1)
Sodium: 142 mEq/L (ref 135–145)
Total Bilirubin: 0.8 mg/dL (ref 0.2–1.2)
Total Protein: 6.4 g/dL (ref 6.0–8.3)

## 2023-01-12 LAB — VITAMIN B12: Vitamin B-12: 408 pg/mL (ref 211–911)

## 2023-01-12 LAB — TSH: TSH: 0.51 u[IU]/mL (ref 0.35–5.50)

## 2023-01-12 MED ORDER — ATORVASTATIN CALCIUM 40 MG PO TABS: 40.0000 mg | ORAL_TABLET | Freq: Every day | ORAL | 3 refills | Status: DC

## 2023-01-12 NOTE — Progress Notes (Signed)
Subjective:  Patient ID: Scott Oneal, male    DOB: June 28, 1941  Age: 82 y.o. MRN: [id number]  CC: No chief complaint on file.   HPI Scott Oneal presents for LBP - better after injection C/o tremor C/o poor balance - lightheaded Lipitor 80 mg - too big, bitter - spitting out  Outpatient Medications Prior to Visit  Medication Sig Dispense Refill   acetaminophen (TYLENOL) 500 MG tablet Take 1 tablet (500 mg total) by mouth every 6 (six) hours as needed for up to 30 doses. 30 tablet 0   apixaban (ELIQUIS) 5 MG TABS tablet Take 1 tablet (5 mg total) by mouth 2 (two) times daily for 7 days, THEN 1 tablet (5 mg total) 2 (two) times daily. 60 tablet 5   calcitRIOL (ROCALTROL) 0.25 MCG capsule TAKE 1 CAPSULE BY MOUTH EVERY DAY 90 capsule 1   carvedilol (COREG) 25 MG tablet TAKE 1 TABLET BY MOUTH TWICE A DAY WITH MEALS 180 tablet 1   cetirizine (ZYRTEC) 10 MG tablet Take 10 mg by mouth daily as needed for allergies.     Cholecalciferol 2000 UNITS TABS Take 2,000 Units by mouth daily.     clonazePAM (KLONOPIN) 0.5 MG tablet Take 1 tablet (0.5 mg total) by mouth 2 (two) times daily as needed for anxiety. 60 tablet 3   diclofenac sodium (VOLTAREN) 1 % GEL APPLY 4 G TOPICALLY 4 TIMES DAILY. (Patient taking differently: Apply 4 g topically 4 (four) times daily as needed (pain).) 200 g 5   hydrALAZINE (APRESOLINE) 10 MG tablet Take 1-2 tablet 3 times a day as needed if blood pressure is >170 60 tablet 3   isosorbide mononitrate (IMDUR) 30 MG 24 hr tablet TAKE 1 TABLET BY MOUTH EVERY DAY 90 tablet 2   levothyroxine (SYNTHROID) 175 MCG tablet TAKE 1 TABLET BY MOUTH EVERY DAY 90 tablet 3   lipase/protease/amylase (CREON) 36000 UNITS CPEP capsule Take 2 capsules (72,000 Units total) by mouth 3 (three) times daily with meals. May also take 1 capsule (36,000 Units total) as needed (with snacks). 240 capsule 11   Multiple Vitamins-Minerals (PRESERVISION AREDS 2) CAPS Take 1 capsule by mouth in the morning  and at bedtime.     nitroGLYCERIN (NITROSTAT) 0.4 MG SL tablet Place 0.4 mg under the tongue every 5 (five) minutes as needed for chest pain.     pantoprazole (PROTONIX) 40 MG tablet TAKE 1 TABLET BY MOUTH TWICE A DAY 180 tablet 1   predniSONE (DELTASONE) 10 MG tablet Take 1-2 tablets (10-20 mg total) by mouth daily with breakfast. 60 tablet 3   Propylene Glycol (SYSTANE BALANCE) 0.6 % SOLN Place 1 drop into both eyes daily as needed (Dry eye).     SYRINGE-NEEDLE, DISP, 3 ML (BD ECLIPSE SYRINGE) 23G X 1" 3 ML MISC As directed IM 50 each 2   tamsulosin (FLOMAX) 0.4 MG CAPS capsule Take 0.4 mg by mouth daily.     testosterone cypionate (DEPOTESTOSTERONE CYPIONATE) 200 MG/ML injection INJECT 2ML INTO MUSCLE EVERY 2 WEEKS 12 mL 1   traMADol (ULTRAM) 50 MG tablet Take 1-2 tablets (50-100 mg total) by mouth every 6 (six) hours as needed for severe pain. 240 tablet 1   atorvastatin (LIPITOR) 80 MG tablet Take 1 tablet (80 mg total) by mouth at bedtime. 30 tablet 11   No facility-administered medications prior to visit.    ROS: Review of Systems  Objective:  BP 120/80 (BP Location: Left Arm, Patient Position: Sitting, Cuff Size: Normal)  Pulse 96   Temp 98.6 F (37 C) (Oral)   Ht 6' (1.829 m)   Wt 189 lb (85.7 kg)   SpO2 96%   BMI 25.63 kg/m   BP Readings from Last 3 Encounters:  01/12/23 120/80  12/26/22 130/82  11/03/22 (!) 157/91    Wt Readings from Last 3 Encounters:  01/12/23 189 lb (85.7 kg)  12/26/22 187 lb (84.8 kg)  11/03/22 199 lb (90.3 kg)    Physical Exam  Lab Results  Component Value Date   WBC 6.7 07/05/2022   HGB 13.5 07/05/2022   HCT 41.6 07/05/2022   PLT 87.0 (L) 07/05/2022   GLUCOSE 130 (H) 07/05/2022   CHOL 143 06/25/2022   TRIG 88 06/25/2022   HDL 42 06/25/2022   LDLCALC 83 06/25/2022   ALT 21 07/05/2022   AST 16 07/05/2022   NA 141 07/05/2022   K 5.0 07/05/2022   CL 104 07/05/2022   CREATININE 1.87 (H) 07/05/2022   BUN 40 (H) 07/05/2022   CO2  32 07/05/2022   TSH 11.04 (H) 04/15/2021   PSA 2.58 07/05/2022   INR 1.3 (H) 06/23/2022   HGBA1C 6.0 12/28/2021   MICROALBUR 1.2 05/18/2017    VAS [place] ABI WITH/WO TBI  Result Date: 10/19/2022  LOWER EXTREMITY DOPPLER STUDY Patient Name:  Scott Oneal  Date of Exam:   10/18/2022 Medical Rec #: [id number]      Accession #:    [id number] Date of Birth: 1941/07/25     Patient Gender: M Patient Age:   56 years Exam Location:  [facility] Procedure:      VAS [place] ABI WITH/WO TBI Referring Phys: Tyrone Apple Rhyan Radler --------------------------------------------------------------------------------  Indications: Patient presents with painful bilateral leg tingling. He feels it              is worse with walking. 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 He still takes              Eliquis and wears compression stockings. High Risk Factors: Hypertension, hyperlipidemia, no history of smoking, prior                    MI, coronary artery disease.  Comparison Study: Previous ABIs performed 09/14/16 were 1.10 on the right and                   1.08 on the left. Performing Technologist: Mariane Masters RVT  Examination Guidelines: A complete evaluation includes at minimum, Doppler waveform signals and systolic blood pressure reading at the level of bilateral brachial, anterior tibial, and posterior tibial arteries, when vessel segments are accessible. Bilateral testing is considered an integral part of a complete examination. Photoelectric Plethysmograph (PPG) waveforms and toe systolic pressure readings are included as required and additional duplex testing as needed. Limited examinations for reoccurring indications may be performed as noted.  ABI Findings: +---------+------------------+-----+-----------+--------+ Right    Rt Pressure (mmHg)IndexWaveform   Comment  +---------+------------------+-----+-----------+--------+ Brachial 143                                         +---------+------------------+-----+-----------+--------+ PTA      254               1.78 multiphasic         +---------+------------------+-----+-----------+--------+  PERO     254               1.78 triphasic           +---------+------------------+-----+-----------+--------+ DP       254               1.78 multiphasic         +---------+------------------+-----+-----------+--------+ Great Toe62                0.43 Abnormal            +---------+------------------+-----+-----------+--------+ +---------+------------------+-----+-----------+-------+ Left     Lt Pressure (mmHg)IndexWaveform   Comment +---------+------------------+-----+-----------+-------+ Brachial 142                                       +---------+------------------+-----+-----------+-------+ PTA      254               1.78 biphasic           +---------+------------------+-----+-----------+-------+ PERO     254               1.78 triphasic          +---------+------------------+-----+-----------+-------+ DP       254               1.78 multiphasic        +---------+------------------+-----+-----------+-------+ Great Toe59                0.41 Abnormal           +---------+------------------+-----+-----------+-------+ +-------+-----------+-----------+------------+------------+ ABI/TBIToday's ABIToday's TBIPrevious ABIPrevious TBI +-------+-----------+-----------+------------+------------+ Right  [place]         0.43       1.10        1.05         +-------+-----------+-----------+------------+------------+ Left   [place]         0.41       1.08        1.01         +-------+-----------+-----------+------------+------------+  Arterial wall calcification precludes accurate ankle pressures and ABIs. Doppler waveforms demonstrate brisk, multiphasic flow consistent with no evidence of significant arterial occlusive disease.  Summary: Right: Resting right  ankle-brachial index indicates noncompressible right lower extremity arteries. The right toe-brachial index is abnormal. Left: Resting left ankle-brachial index indicates noncompressible left lower extremity arteries. The left toe-brachial index is abnormal. *See table(s) above for measurements and observations.  Electronically signed by Kathlyn Sacramento MD on 10/19/2022 at 11:26:39 AM.    Final     Assessment & Plan:   Problem List Items Addressed This Visit       Cardiovascular and Mediastinum   DVT (deep venous thrombosis) (HCC)    On Eliquis  Compression sock      Relevant Medications   atorvastatin (LIPITOR) 40 MG tablet   CAD S/P percutaneous coronary angioplasty - Primary    Lipitor 80 mg - too big, bitter - spitting out      Relevant Medications   atorvastatin (LIPITOR) 40 MG tablet     Digestive   Celiac disease    On Gluten free diet      Relevant Orders   Comprehensive metabolic panel   TSH   Vitamin B12     Nervous and Auditory   Familial tremor    2024 B hands R>L Will observe      Relevant Orders   Comprehensive metabolic panel  TSH   Vitamin B12     Other   Dyslipidemia    Lipitor 80 mg - too big, bitter - spitting out      Relevant Medications   atorvastatin (LIPITOR) 40 MG tablet   Other Visit Diagnoses     Paresthesia       Relevant Orders   Vitamin B12         Meds ordered this encounter  Medications   atorvastatin (LIPITOR) 40 MG tablet    Sig: Take 1 tablet (40 mg total) by mouth daily.    Dispense:  90 tablet    Refill:  3      Follow-up: Return in about 4 weeks (around 02/09/2023) for a follow-up visit.  Walker Kehr, MD

## 2023-01-12 NOTE — Assessment & Plan Note (Signed)
2024 B hands R>L Will observe

## 2023-01-12 NOTE — Assessment & Plan Note (Signed)
Lipitor 80 mg - too big, bitter - spitting out

## 2023-01-12 NOTE — Assessment & Plan Note (Signed)
On Gluten free diet 

## 2023-01-12 NOTE — Patient Instructions (Signed)
Hold Lipitor x 1 week. Then, take Lipitor 40 mg every other day  Hold Flomax x 1 week

## 2023-01-12 NOTE — Assessment & Plan Note (Signed)
On Eliquis  Compression sock 

## 2023-01-13 ENCOUNTER — Telehealth: Payer: Self-pay

## 2023-01-13 DIAGNOSIS — L819 Disorder of pigmentation, unspecified: Secondary | ICD-10-CM

## 2023-01-13 DIAGNOSIS — M79672 Pain in left foot: Secondary | ICD-10-CM

## 2023-01-13 NOTE — Telephone Encounter (Signed)
Pt called c/o severe L foot pain. He saw his PCP yesterday and he recommended that he be seen again by this office.  Reviewed pt's chart, returned call for clarification, two identifiers used. Reviewed pt's history with him and POC. Pt stated that the foot pain has worsened over the past 2 months. He states that it is better when he elevates and worse when he ambulates. He has a red discoloration to the lateral aspect of his foot and also an area of blue/black near the toes and dorsal aspect of foot. His PCP was concerned and recommended a visit.  Spoke to [place], [place] who reviewed pt's chart and advised on an appt with ABI.  Informed pt of advice and appts were scheduled based on availability and pt preference. Confirmed understanding.

## 2023-01-18 ENCOUNTER — Ambulatory Visit (HOSPITAL_COMMUNITY)
Admission: RE | Admit: 2023-01-18 | Discharge: 2023-01-18 | Disposition: A | Discharge status: Home / Self Care | Payer: PPO | Source: Ambulatory Visit | Attending: Vascular Surgery | Admitting: Vascular Surgery

## 2023-01-18 DIAGNOSIS — L819 Disorder of pigmentation, unspecified: Secondary | ICD-10-CM | POA: Insufficient documentation

## 2023-01-18 DIAGNOSIS — M79672 Pain in left foot: Secondary | ICD-10-CM | POA: Insufficient documentation

## 2023-01-18 LAB — VAS US ABI WITH/WO TBI

## 2023-01-18 NOTE — Progress Notes (Unsigned)
HISTORY AND PHYSICAL     CC:  follow up. Requesting Provider:  Plotnikov, Evie Lacks, MD  HPI: This is a 82 y.o. male who is here today for follow up for leg pain.    The pt has hx of a fall  back in August and subsequently developed a DVT in the left leg.  He underwent mechanical thrombectomy of an iliofemoral DVT left lower extremity DVT by Dr. Stanford Breed on 06/24/2022.  Subsequent duplex scan on 07/26/2022 showed resolution of his iliofemoral DVT.  He did have some persistent clot in the left femoral vein down to the calf.  He has been maintained on Eliquis since 06/24/2022.  Pt was last seen in January 2024 for leg pain. He was having achiness and heaviness that was aggravated by standing and relieved with elevation and were consistent with venous hypertension and postphlebitic syndrome.  He was having some paresthesias in the left leg and tingling sensation at times.  He has hx of back pain.  He was not having any symptoms consistent with claudication, rest pain and did not have any non healing ulcerations.  He was compliant with knee high 20-7mmHg compression, which did help his symptoms.    The pt returns today for follow up.  He states he is still having some pain in the left ankle and it goes across the top of the ankle.  He states it hurts to flex his foot.  It has been present since his fall. He has not had an xray of his ankle.  It really has not changed since he saw Dr. Scot Dock in January.  He states that his ankle hurts with walking.  If he is walking a significant distance, he uses a cane.  He does not have any sores on his feet.  He does have a couple of small areas on the top of the foot that appear to be from blood thinner.  He recently had an ESI, which did help some. He does not have any pain in the right foot.    He states he is still taking his Eliquis and bruises easily from this.  He states the compression has helped with his swelling.  [facility]  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 He states he has some numbness/tingling in his toes.    The pt is on a statin for cholesterol management.    The pt is not on an aspirin.    Other AC:  Eliquis The pt is on BB, hydralazine for hypertension.  The pt does not have diabetes. Tobacco hx:  never    Past Medical History:  Diagnosis Date   Allergy    Arthritis    Calculus of gallbladder without mention of cholecystitis    Cataract    bilateral cateracts removed   Celiac disease    Chronic kidney disease    stage 3   Coronary atherosclerosis of unspecified type of vessel, native or graft    Dermatitis 06/08/2021   Erythema of lower extremity 09/08/2021   Esophageal reflux    Glaucoma    History of cellulitis 09/08/2021   History of kidney stones    Loss of weight    Lower extremity weakness 05/31/2021   Macular degeneration    Rt eye . injections   Old myocardial infarction    Osteoporosis, unspecified    Other malaise and fatigue    Other specified cardiac dysrhythmias(427.89)  Other testicular hypofunction    Pancreatic insufficiency 05/31/2021   Personal history of colonic polyps    Postural dizziness with presyncope 12/13/2021   Pure hypercholesterolemia    Thrombocytopenia (HCC)    chronic, per PCP notes dating back to 2009   Tinea pedis 05/31/2021   Unspecified asthma(493.90)    Unspecified essential hypertension    Unspecified hypothyroidism    Venous stasis dermatitis 09/08/2021    Past Surgical History:  Procedure Laterality Date   ARTERY BIOPSY Left 02/18/2022   Procedure: LEFT TEMPORAL ARTERY BIOPSY;  Surgeon: Kieth Brightly Arta Bruce, MD;  Location: [facility];  Service: General;  Laterality: Left;   cataract surgery Bilateral    COLONOSCOPY  06/26/2017   [facility]   COLONOSCOPY WITH PROPOFOL N/A 11/27/2014   Procedure: COLONOSCOPY WITH PROPOFOL;  Surgeon: Milus Banister, MD;  Location: [facility];  Service: Endoscopy;  Laterality: N/A;   CORONARY ANGIOPLASTY      CORONARY STENT PLACEMENT  2011   Cypher; in distal circumflex artery   CYSTOSCOPY  1998   IR URETERAL STENT LEFT NEW ACCESS W/O SEP NEPHROSTOMY CATH  06/21/2021   KNEE SURGERY     right   LEFT HEART CATH AND CORONARY ANGIOGRAPHY N/A 01/31/2020   Procedure: LEFT HEART CATH AND CORONARY ANGIOGRAPHY;  Surgeon: Belva Crome, MD;  Location: [facility];  Service: Cardiovascular;  Laterality: N/A;   NEPHROLITHOTOMY Left 06/21/2021   Procedure: NEPHROLITHOTOMY PERCUTANEOUS;  Surgeon: Franchot Gallo, MD;  Location: [facility];  Service: Urology;  Laterality: Left;  90 MINS   PERIPHERAL VASCULAR THROMBECTOMY Left 06/24/2022   Procedure: PERIPHERAL VASCULAR THROMBECTOMY;  Surgeon: Cherre Robins, MD;  Location: [facility];  Service: Cardiovascular;  Laterality: Left;   SHOULDER SURGERY Left 07/2013   Dr Shara Blazing   UPPER GASTROINTESTINAL ENDOSCOPY      Allergies  Allergen Reactions   Biaxin [Clarithromycin]     Dizziness   Tizanidine     Felt funny   Allantoin-Pramoxine Other (See Comments)    Pt does't remember reaction   Amlodipine Swelling    Edema    Amoxicillin Rash    Rash on arms that developed towards end of 7 day treatment   Benzalkonium Chloride Itching and Rash   Doxycycline Photosensitivity and Rash   Metoprolol Tartrate Other (See Comments)    REACTION: fatigue    Current Outpatient Medications  Medication Sig Dispense Refill   acetaminophen (TYLENOL) 500 MG tablet Take 1 tablet (500 mg total) by mouth every 6 (six) hours as needed for up to 30 doses. 30 tablet 0   apixaban (ELIQUIS) 5 MG TABS tablet Take 1 tablet (5 mg total) by mouth 2 (two) times daily for 7 days, THEN 1 tablet (5 mg total) 2 (two) times daily. 60 tablet 5   atorvastatin (LIPITOR) 40 MG tablet Take 1 tablet (40 mg total) by mouth daily. 90 tablet 3   calcitRIOL (ROCALTROL) 0.25 MCG capsule TAKE 1 CAPSULE BY MOUTH EVERY DAY 90 capsule 1   carvedilol (COREG) 25 MG tablet TAKE 1 TABLET  BY MOUTH TWICE A DAY WITH MEALS 180 tablet 1   cetirizine (ZYRTEC) 10 MG tablet Take 10 mg by mouth daily as needed for allergies.     Cholecalciferol 2000 UNITS TABS Take 2,000 Units by mouth daily.     clonazePAM (KLONOPIN) 0.5 MG tablet Take 1 tablet (0.5 mg total) by mouth 2 (two) times daily as needed for anxiety. 60 tablet 3   diclofenac  sodium (VOLTAREN) 1 % GEL APPLY 4 G TOPICALLY 4 TIMES DAILY. (Patient taking differently: Apply 4 g topically 4 (four) times daily as needed (pain).) 200 g 5   hydrALAZINE (APRESOLINE) 10 MG tablet Take 1-2 tablet 3 times a day as needed if blood pressure is >170 60 tablet 3   isosorbide mononitrate (IMDUR) 30 MG 24 hr tablet TAKE 1 TABLET BY MOUTH EVERY DAY 90 tablet 2   levothyroxine (SYNTHROID) 175 MCG tablet TAKE 1 TABLET BY MOUTH EVERY DAY 90 tablet 3   lipase/protease/amylase (CREON) 36000 UNITS CPEP capsule Take 2 capsules (72,000 Units total) by mouth 3 (three) times daily with meals. May also take 1 capsule (36,000 Units total) as needed (with snacks). 240 capsule 11   Multiple Vitamins-Minerals (PRESERVISION AREDS 2) CAPS Take 1 capsule by mouth in the morning and at bedtime.     nitroGLYCERIN (NITROSTAT) 0.4 MG SL tablet Place 0.4 mg under the tongue every 5 (five) minutes as needed for chest pain.     pantoprazole (PROTONIX) 40 MG tablet TAKE 1 TABLET BY MOUTH TWICE A DAY 180 tablet 1   predniSONE (DELTASONE) 10 MG tablet Take 1-2 tablets (10-20 mg total) by mouth daily with breakfast. 60 tablet 3   Propylene Glycol (SYSTANE BALANCE) 0.6 % SOLN Place 1 drop into both eyes daily as needed (Dry eye).     SYRINGE-NEEDLE, DISP, 3 ML (BD ECLIPSE SYRINGE) 23G X 1" 3 ML MISC As directed IM 50 each 2   tamsulosin (FLOMAX) 0.4 MG CAPS capsule Take 0.4 mg by mouth daily.     testosterone cypionate (DEPOTESTOSTERONE CYPIONATE) 200 MG/ML injection INJECT 2ML INTO MUSCLE EVERY 2 WEEKS 12 mL 1   traMADol (ULTRAM) 50 MG tablet Take 1-2 tablets (50-100 mg total)  by mouth every 6 (six) hours as needed for severe pain. 240 tablet 1   No current facility-administered medications for this visit.    Family History  Problem Relation Age of Onset   Lymphoma Father    Hypertension Other    Vision loss Maternal Grandmother    Colon cancer Son 43   Asthma Neg Hx    Esophageal cancer Neg Hx    Rectal cancer Neg Hx    Stomach cancer Neg Hx    Colon polyps Neg Hx     Social History   Socioeconomic History   Marital status: Married    Spouse name: Damarien Welsch   Number of children: 3   Years of education: Not on file   Highest education level: Not on file  Occupational History   Occupation: retired    Fish farm manager: RETIRED    Comment: Engineering  Tobacco Use   Smoking status: Never    Passive exposure: Never   Smokeless tobacco: Never  Vaping Use   Vaping Use: Never used  Substance and Sexual Activity   Alcohol use: No   Drug use: No   Sexual activity: Yes  Other Topics Concern   Not on file  Social History Narrative   Retired.   Regular Exercise-Yes; yoga & silver sneakers   Daily Caffeine Use.            Social Determinants of Health   Financial Resource Strain: Low Risk  (07/06/2022)   Overall Financial Resource Strain (CARDIA)    Difficulty of Paying Living Expenses: Not hard at all  Food Insecurity: No Food Insecurity (07/06/2022)   Hunger Vital Sign    Worried About Running Out of Food in the Last Year:  Never true    Ran Out of Food in the Last Year: Never true  Transportation Needs: No Transportation Needs (07/06/2022)   PRAPARE - Hydrologist (Medical): No    Lack of Transportation (Non-Medical): No  Physical Activity: Sufficiently Active (07/06/2022)   Exercise Vital Sign    Days of Exercise per Week: 7 days    Minutes of Exercise per Session: 30 min  Stress: No Stress Concern Present (07/06/2022)   [place]    Feeling of Stress  : Not at all  Social Connections: [place] (07/06/2022)   Social Connection and Isolation Panel [NHANES]    Frequency of Communication with Friends and Family: More than three times a week    Frequency of Social Gatherings with Friends and Family: More than three times a week    Attends Religious Services: More than 4 times per year    Active Member of [employer] or Organizations: Yes    Attends Music therapist: More than 4 times per year    Marital Status: Married  Human resources officer Violence: Not At Risk (07/06/2022)   Humiliation, Afraid, Rape, and Kick questionnaire    Fear of Current or Ex-Partner: No    Emotionally Abused: No    Physically Abused: No    Sexually Abused: No     REVIEW OF SYSTEMS:   [X]  denotes positive finding, [ ]  denotes negative finding Cardiac  Comments:  Chest pain or chest pressure:    Shortness of breath upon exertion:    Short of breath when lying flat:    Irregular heart rhythm:        Vascular    Pain in calf, thigh, or hip brought on by ambulation:    Pain in feet at night that wakes you up from your sleep:     Blood clot in your veins:    Leg swelling:         Pulmonary    Oxygen at home:    Productive cough:     Wheezing:         Neurologic    Sudden weakness in arms or legs:     Sudden numbness in arms or legs:     Sudden onset of difficulty speaking or slurred speech:    Temporary loss of vision in one eye:     Problems with dizziness:         Gastrointestinal    Blood in stool:     Vomited blood:         Genitourinary    Burning when urinating:     Blood in urine:        Psychiatric    Major depression:         Hematologic    Bleeding problems:    Problems with blood clotting too easily:        Skin    Rashes or ulcers:        Constitutional    Fever or chills:      PHYSICAL EXAMINATION:  Today's Vitals   01/19/23 1129  BP: 139/77  Pulse: 69  Resp: 20  Temp: 98.1 F (36.7 C)  TempSrc:  Temporal  SpO2: 98%  Weight: 188 lb 8 oz (85.5 kg)  Height: 6' (1.829 m)  PainSc: 10-Worst pain ever  PainLoc: Foot   Body mass index is 25.57 kg/m.   General:  WDWN in NAD; vital signs documented above  Gait: Not observed HENT: WNL, normocephalic Pulmonary: normal non-labored breathing , without wheezing Cardiac: regular HR, without carotid bruits Skin: reddish discoloration left lateral foot.  Vascular Exam/Pulses:  Right Left  Radial 2+ (normal) 2+ (normal)  DP 2+ (normal) 2+ (normal)  PT Unable to palpate Unable to palpate   Extremities: without ischemic changes, without Gangrene , without cellulitis; without open wounds; mild swelling BLE; pain with flexion of the left ankle.  Musculoskeletal: no muscle wasting or atrophy  Neurologic: A&O X 3 Psychiatric:  The pt has Normal affect.   Non-Invasive Vascular Imaging:   ABI's/TBI's on 01/18/2023: Right:  [place]-triphasic  - Great toe pressure: unable to insonate Left:  [place] triphasic - Great toe pressure: 118   Previous ABI's/TBI's on 10/19/2022: Right:  [place]/0.43 - Great toe pressure: 62 Left:  [place]/59 - Great toe pressure:  59    ASSESSMENT/PLAN:: 82 y.o. male here for follow up for PAD with hx of  DVT in the left leg.  He underwent mechanical thrombectomy of an iliofemoral DVT left lower extremity DVT by Dr. Stanford Breed on 06/24/2022.  He comes in today for continued pain in the left ankle.    -pt ABI are non compressible but he has triphasic waveforms and his DP pulse is easily palpable and a normal toe pressure.  He is having pain in the left medial ankle across to the lateral side.  I do not feel that his pain is arterial in nature.    He fell last year and has had pain since then.  He has not had an xray of the left ankle/foot.  Recommend starting with this and following up with Dr. Alain Marion or [facility] for further evaluation.   -pt lower extremity edema improved with wearing compression, which he is compliant with.  Discussed  continuing to stay [facility]  We discussed protecting his feet.   Dr. Scot Dock wanted to see pt back in August with repeat duplex and for him to continue Eliquis until that time and then they would have further discussions if he needed to continue it at that time.  We scheduled that appt today.  He will call sooner if he has any other issues before then.     Leontine Locket, [facility] [phone]  Clinic MD:   Scot Dock

## 2023-01-19 ENCOUNTER — Ambulatory Visit: Payer: PPO | Admitting: Internal Medicine

## 2023-01-19 ENCOUNTER — Encounter: Payer: Self-pay | Admitting: Physician Assistant

## 2023-01-19 ENCOUNTER — Ambulatory Visit (INDEPENDENT_AMBULATORY_CARE_PROVIDER_SITE_OTHER): Payer: PPO | Admitting: Physician Assistant

## 2023-01-19 VITALS — BP 139/77 | HR 69 | Temp 98.1°F | Resp 20 | Ht 72.0 in | Wt 188.5 lb

## 2023-01-19 DIAGNOSIS — M79672 Pain in left foot: Secondary | ICD-10-CM

## 2023-01-20 ENCOUNTER — Other Ambulatory Visit: Payer: Self-pay

## 2023-01-20 DIAGNOSIS — L819 Disorder of pigmentation, unspecified: Secondary | ICD-10-CM

## 2023-01-20 DIAGNOSIS — M79672 Pain in left foot: Secondary | ICD-10-CM

## 2023-01-20 DIAGNOSIS — L539 Erythematous condition, unspecified: Secondary | ICD-10-CM

## 2023-01-20 DIAGNOSIS — I82422 Acute embolism and thrombosis of left iliac vein: Secondary | ICD-10-CM

## 2023-01-25 DIAGNOSIS — M5459 Other low back pain: Secondary | ICD-10-CM | POA: Diagnosis not present

## 2023-01-25 DIAGNOSIS — M5416 Radiculopathy, lumbar region: Secondary | ICD-10-CM | POA: Diagnosis not present

## 2023-01-27 DIAGNOSIS — M792 Neuralgia and neuritis, unspecified: Secondary | ICD-10-CM | POA: Diagnosis not present

## 2023-01-27 DIAGNOSIS — M5136 Other intervertebral disc degeneration, lumbar region: Secondary | ICD-10-CM | POA: Diagnosis not present

## 2023-01-27 DIAGNOSIS — M48062 Spinal stenosis, lumbar region with neurogenic claudication: Secondary | ICD-10-CM | POA: Diagnosis not present

## 2023-02-08 DIAGNOSIS — M5459 Other low back pain: Secondary | ICD-10-CM | POA: Diagnosis not present

## 2023-02-08 DIAGNOSIS — M5416 Radiculopathy, lumbar region: Secondary | ICD-10-CM | POA: Diagnosis not present

## 2023-02-09 ENCOUNTER — Encounter: Payer: Self-pay | Admitting: Internal Medicine

## 2023-02-09 ENCOUNTER — Ambulatory Visit (INDEPENDENT_AMBULATORY_CARE_PROVIDER_SITE_OTHER): Payer: PPO | Admitting: Internal Medicine

## 2023-02-09 VITALS — BP 120/70 | HR 78 | Temp 98.6°F | Ht 72.0 in | Wt 184.0 lb

## 2023-02-09 DIAGNOSIS — F411 Generalized anxiety disorder: Secondary | ICD-10-CM | POA: Diagnosis not present

## 2023-02-09 DIAGNOSIS — M5442 Lumbago with sciatica, left side: Secondary | ICD-10-CM | POA: Diagnosis not present

## 2023-02-09 DIAGNOSIS — G8929 Other chronic pain: Secondary | ICD-10-CM

## 2023-02-09 DIAGNOSIS — M7989 Other specified soft tissue disorders: Secondary | ICD-10-CM | POA: Diagnosis not present

## 2023-02-09 DIAGNOSIS — F4321 Adjustment disorder with depressed mood: Secondary | ICD-10-CM | POA: Diagnosis not present

## 2023-02-09 DIAGNOSIS — I739 Peripheral vascular disease, unspecified: Secondary | ICD-10-CM

## 2023-02-09 DIAGNOSIS — M48062 Spinal stenosis, lumbar region with neurogenic claudication: Secondary | ICD-10-CM

## 2023-02-09 DIAGNOSIS — M79605 Pain in left leg: Secondary | ICD-10-CM

## 2023-02-09 DIAGNOSIS — I82422 Acute embolism and thrombosis of left iliac vein: Secondary | ICD-10-CM

## 2023-02-09 MED ORDER — APIXABAN 5 MG PO TABS: 5.0000 mg | ORAL_TABLET | Freq: Two times a day (BID) | ORAL | 5 refills | Status: DC

## 2023-02-09 MED ORDER — PREDNISONE 10 MG PO TABS: 10.0000 mg | ORAL_TABLET | Freq: Every day | ORAL | 1 refills | Status: DC

## 2023-02-09 NOTE — Assessment & Plan Note (Signed)
Cont on Lexapro °

## 2023-02-09 NOTE — Assessment & Plan Note (Signed)
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 Lenell Antu on 06/24/2022. On Eliquis

## 2023-02-09 NOTE — Assessment & Plan Note (Addendum)
L sciatica: getting epidural inj x2 Due in May  Pain is >5-8/10: pt wants to try Prednisone.Marland KitchenMarland KitchenMarland KitchenStart 10 mg/d  Potential benefits of a long term steroid  use as well as potential risks  and complications were explained to the patient and were aknowledged.

## 2023-02-09 NOTE — Assessment & Plan Note (Addendum)
L sciatica: getting epidural inj x2 - Dr Drucie Ip Due in May Blue-Emu cream was recommended to use 2-3 times a day   Pain is >5-8/10: pt wants to try Prednisone.Marland KitchenMarland KitchenMarland KitchenStart 10 mg/d  Potential benefits of a long term steroid  use as well as potential risks  and complications were explained to the patient and were aknowledged.

## 2023-02-09 NOTE — Assessment & Plan Note (Signed)
Look up Kipp Brood device

## 2023-02-09 NOTE — Assessment & Plan Note (Addendum)
L sciatica: getting epidural inj x2 - Dr Drucie Ip Due in May On Tramadol prn  Pain is >5-8/10: pt wants to try Prednisone.Marland KitchenMarland KitchenMarland KitchenStart 10 mg/d  Potential benefits of a long term steroid  use as well as potential risks  and complications were explained to the patient and were aknowledged.

## 2023-02-09 NOTE — Patient Instructions (Signed)
[employer] Treats Depression, Anxiety, And Insomnia

## 2023-02-09 NOTE — Progress Notes (Signed)
Subjective:  Patient ID: Scott Oneal, male    DOB: [birth year]  Age: 82 y.o. MRN: [id number]  CC: Follow-up (4 week f/u)   HPI Scott Oneal presents for L sciatica, LBP, DVT, PAD f/u Pain is >5-8/10: pt wants to try Prednisone....  Outpatient Medications Prior to Visit  Medication Sig Dispense Refill   acetaminophen (TYLENOL) 500 MG tablet Take 1 tablet (500 mg total) by mouth every 6 (six) hours as needed for up to 30 doses. 30 tablet 0   atorvastatin (LIPITOR) 40 MG tablet Take 1 tablet (40 mg total) by mouth daily. 90 tablet 3   calcitRIOL (ROCALTROL) 0.25 MCG capsule TAKE 1 CAPSULE BY MOUTH EVERY DAY 90 capsule 1   carvedilol (COREG) 25 MG tablet TAKE 1 TABLET BY MOUTH TWICE A DAY WITH MEALS 180 tablet 1   cetirizine (ZYRTEC) 10 MG tablet Take 10 mg by mouth daily as needed for allergies.     Cholecalciferol 2000 UNITS TABS Take 2,000 Units by mouth daily.     clonazePAM (KLONOPIN) 0.5 MG tablet Take 1 tablet (0.5 mg total) by mouth 2 (two) times daily as needed for anxiety. 60 tablet 3   diclofenac sodium (VOLTAREN) 1 % GEL APPLY 4 G TOPICALLY 4 TIMES DAILY. (Patient taking differently: Apply 4 g topically 4 (four) times daily as needed (pain).) 200 g 5   hydrALAZINE (APRESOLINE) 10 MG tablet Take 1-2 tablet 3 times a day as needed if blood pressure is >170 60 tablet 3   isosorbide mononitrate (IMDUR) 30 MG 24 hr tablet TAKE 1 TABLET BY MOUTH EVERY DAY 90 tablet 2   levothyroxine (SYNTHROID) 175 MCG tablet TAKE 1 TABLET BY MOUTH EVERY DAY 90 tablet 3   lipase/protease/amylase (CREON) 36000 UNITS CPEP capsule Take 2 capsules (72,000 Units total) by mouth 3 (three) times daily with meals. May also take 1 capsule (36,000 Units total) as needed (with snacks). 240 capsule 11   Multiple Vitamins-Minerals (PRESERVISION AREDS 2) CAPS Take 1 capsule by mouth in the morning and at bedtime.     nitroGLYCERIN (NITROSTAT) 0.4 MG SL tablet Place 0.4 mg under the tongue every 5 (five) minutes as  needed for chest pain.     pantoprazole (PROTONIX) 40 MG tablet TAKE 1 TABLET BY MOUTH TWICE A DAY 180 tablet 1   predniSONE (DELTASONE) 10 MG tablet Take 1-2 tablets (10-20 mg total) by mouth daily with breakfast. 60 tablet 3   Propylene Glycol (SYSTANE BALANCE) 0.6 % SOLN Place 1 drop into both eyes daily as needed (Dry eye).     SYRINGE-NEEDLE, DISP, 3 ML (BD ECLIPSE SYRINGE) 23G X 1" 3 ML MISC As directed IM 50 each 2   tamsulosin (FLOMAX) 0.4 MG CAPS capsule Take 0.4 mg by mouth daily.     testosterone cypionate (DEPOTESTOSTERONE CYPIONATE) 200 MG/ML injection INJECT 2ML INTO MUSCLE EVERY 2 WEEKS 12 mL 1   traMADol (ULTRAM) 50 MG tablet Take 1-2 tablets (50-100 mg total) by mouth every 6 (six) hours as needed for severe pain. 240 tablet 1   apixaban (ELIQUIS) 5 MG TABS tablet Take 1 tablet (5 mg total) by mouth 2 (two) times daily for 7 days, THEN 1 tablet (5 mg total) 2 (two) times daily. 60 tablet 5   No facility-administered medications prior to visit.    ROS: Review of Systems  Constitutional:  Positive for fatigue. Negative for appetite change and unexpected weight change.  HENT:  Negative for congestion, nosebleeds, sneezing, sore throat and  trouble swallowing.   Eyes:  Negative for itching and visual disturbance.  Respiratory:  Negative for cough.   Cardiovascular:  Negative for chest pain, palpitations and leg swelling.  Gastrointestinal:  Negative for abdominal distention, blood in stool, diarrhea and nausea.  Genitourinary:  Negative for frequency and hematuria.  Musculoskeletal:  Positive for arthralgias, back pain and gait problem. Negative for joint swelling and neck pain.  Skin:  Negative for rash.  Neurological:  Negative for dizziness, tremors, speech difficulty and weakness.  Psychiatric/Behavioral:  Negative for agitation, dysphoric mood and sleep disturbance. The patient is not nervous/anxious.     Objective:  BP 120/70 (BP Location: Right Arm, Patient Position:  Sitting, Cuff Size: Large)   Pulse 78   Temp 98.6 F (37 C) (Oral)   Ht 6' (1.829 m)   Wt 184 lb (83.5 kg)   SpO2 97%   BMI 24.95 kg/m   BP Readings from Last 3 Encounters:  02/09/23 120/70  01/19/23 139/77  01/12/23 120/80    Wt Readings from Last 3 Encounters:  02/09/23 184 lb (83.5 kg)  01/19/23 188 lb 8 oz (85.5 kg)  01/12/23 189 lb (85.7 kg)    Physical Exam  Lab Results  Component Value Date   WBC 6.7 07/05/2022   HGB 13.5 07/05/2022   HCT 41.6 07/05/2022   PLT 87.0 (L) 07/05/2022   GLUCOSE 126 (H) 01/12/2023   CHOL 143 06/25/2022   TRIG 88 06/25/2022   HDL 42 06/25/2022   LDLCALC 83 06/25/2022   ALT 31 01/12/2023   AST 31 01/12/2023   NA 142 01/12/2023   K 5.3 (H) 01/12/2023   CL 103 01/12/2023   CREATININE 2.06 (H) 01/12/2023   BUN 45 (H) 01/12/2023   CO2 33 (H) 01/12/2023   TSH 0.51 01/12/2023   PSA 2.58 07/05/2022   INR 1.3 (H) 06/23/2022   HGBA1C 6.0 12/28/2021   MICROALBUR 1.2 05/18/2017    VAS [place] ABI WITH/WO TBI  Result Date: 01/18/2023  LOWER EXTREMITY DOPPLER STUDY Patient Name:  Scott Oneal  Date of Exam:   01/18/2023 Medical Rec #: [id number]      Accession #:    [id number] Date of Birth: May 06, 1941     Patient Gender: M Patient Age:   7 years Exam Location:  [facility] Procedure:      VAS [place] ABI WITH/WO TBI Referring Phys: Cristal Deer DICKSON --------------------------------------------------------------------------------  Indications: Peripheral artery disease. High Risk Factors: Hypertension, hyperlipidemia, no history of smoking, coronary                    artery disease.  Comparison Study: 10/18/2022 ABI/TBI- right=[place]/0.43, left=[place]/0.41 Performing Technologist: Gertie Fey MHA, RVT, RDCS, RDMS  Examination Guidelines: A complete evaluation includes at minimum, Doppler waveform signals and systolic blood pressure reading at the level of bilateral brachial, anterior tibial, and posterior tibial arteries, when vessel  segments are accessible. Bilateral testing is considered an integral part of a complete examination. Photoelectric Plethysmograph (PPG) waveforms and toe systolic pressure readings are included as required and additional duplex testing as needed. Limited examinations for reoccurring indications may be performed as noted.  ABI Findings: +---------+------------------+-----+---------+------------------+ Right    Rt Pressure (mmHg)IndexWaveform Comment            +---------+------------------+-----+---------+------------------+ Brachial 171                                                +---------+------------------+-----+---------+------------------+  PTA      Noncompressible        triphasic                   +---------+------------------+-----+---------+------------------+ DP       Noncompressible        triphasic                   +---------+------------------+-----+---------+------------------+ Great Toe                                Unable to insonate +---------+------------------+-----+---------+------------------+ +---------+------------------+-----+---------+-------+ Left     Lt Pressure (mmHg)IndexWaveform Comment +---------+------------------+-----+---------+-------+ Brachial 135                                     +---------+------------------+-----+---------+-------+ PTA      Noncompressible        triphasic        +---------+------------------+-----+---------+-------+ DP       Noncompressible        triphasic        +---------+------------------+-----+---------+-------+ [place]               0.69                  +---------+------------------+-----+---------+-------+ +-------+---------------+------------------+---------------+------------+ ABI/TBIToday's ABI    Today's TBI       Previous ABI   Previous TBI +-------+---------------+------------------+---------------+------------+ Right  NoncompressibleUnable to  insonateNoncompressible0.43         +-------+---------------+------------------+---------------+------------+ Left   Noncompressible0.69              Noncompressible0.41         +-------+---------------+------------------+---------------+------------+  Bilateral ABIs appear essentially unchanged compared to prior study on 10/18/2022.  Summary: Right: Resting right ankle-brachial index indicates noncompressible right lower extremity arteries. The right toe-brachial index is abnormal. Left: Resting left ankle-brachial index indicates noncompressible left lower extremity arteries. The left toe-brachial index is abnormal. *See table(s) above for measurements and observations.  Electronically signed by Waverly Ferrari MD on 01/18/2023 at 4:01:40 PM.    Final     Assessment & Plan:   Problem List Items Addressed This Visit   None     No orders of the defined types were placed in this encounter.     Follow-up: No follow-ups on file.  Sonda Primes, MD

## 2023-02-09 NOTE — Assessment & Plan Note (Signed)
On Lipitor, Eliquis

## 2023-02-09 NOTE — Assessment & Plan Note (Deleted)
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 Hawken on 06/24/2022. On Eliquis 

## 2023-03-12 ENCOUNTER — Other Ambulatory Visit: Payer: Self-pay | Admitting: Internal Medicine

## 2023-03-14 ENCOUNTER — Ambulatory Visit (INDEPENDENT_AMBULATORY_CARE_PROVIDER_SITE_OTHER): Payer: PPO | Admitting: Internal Medicine

## 2023-03-14 ENCOUNTER — Encounter: Payer: Self-pay | Admitting: Internal Medicine

## 2023-03-14 VITALS — BP 136/82 | HR 82 | Temp 97.8°F | Ht 72.0 in | Wt 192.0 lb

## 2023-03-14 DIAGNOSIS — M5442 Lumbago with sciatica, left side: Secondary | ICD-10-CM | POA: Diagnosis not present

## 2023-03-14 DIAGNOSIS — R269 Unspecified abnormalities of gait and mobility: Secondary | ICD-10-CM | POA: Diagnosis not present

## 2023-03-14 DIAGNOSIS — N1832 Chronic kidney disease, stage 3b: Secondary | ICD-10-CM

## 2023-03-14 DIAGNOSIS — E034 Atrophy of thyroid (acquired): Secondary | ICD-10-CM

## 2023-03-14 DIAGNOSIS — G8929 Other chronic pain: Secondary | ICD-10-CM

## 2023-03-14 DIAGNOSIS — I82422 Acute embolism and thrombosis of left iliac vein: Secondary | ICD-10-CM | POA: Diagnosis not present

## 2023-03-14 DIAGNOSIS — I10 Essential (primary) hypertension: Secondary | ICD-10-CM | POA: Diagnosis not present

## 2023-03-14 DIAGNOSIS — D696 Thrombocytopenia, unspecified: Secondary | ICD-10-CM

## 2023-03-14 DIAGNOSIS — K900 Celiac disease: Secondary | ICD-10-CM | POA: Diagnosis not present

## 2023-03-14 DIAGNOSIS — M353 Polymyalgia rheumatica: Secondary | ICD-10-CM | POA: Diagnosis not present

## 2023-03-14 MED ORDER — ASPIRIN 81 MG PO TBEC: 81.0000 mg | DELAYED_RELEASE_TABLET | Freq: Two times a day (BID) | ORAL | 3 refills | Status: DC

## 2023-03-14 MED ORDER — PREDNISONE 10 MG PO TABS: 40.0000 mg | ORAL_TABLET | Freq: Every day | ORAL | 3 refills | Status: DC

## 2023-03-14 NOTE — Assessment & Plan Note (Signed)
On Gluten free diet 

## 2023-03-14 NOTE — Assessment & Plan Note (Addendum)
On Prednisone 10 mg/d pc. Not better. Start Prednisone 40 mg/d

## 2023-03-14 NOTE — Assessment & Plan Note (Signed)
BP Readings from Last 3 Encounters:  03/14/23 136/82  02/09/23 120/70  01/19/23 139/77  Will adjust BP meds

## 2023-03-14 NOTE — Patient Instructions (Addendum)
Start thinking about moving to a one level town home  Stop Eliquis due to falls and multiple bruises - wean off  Start EC aspirin 81 mg bid  Start Prednisone 40 mg/d

## 2023-03-14 NOTE — Assessment & Plan Note (Signed)
On Prednisone 10 mg/d ? Potential benefits of a long term steroid  use as well as potential risks  and complications were explained to the patient and were aknowledged. ?

## 2023-03-14 NOTE — Progress Notes (Signed)
Subjective:  Patient ID: Scott Oneal, male    DOB: 10-28-1941  Age: 82 y.o. MRN: [id number]  CC: Medical Management of Chronic Issues (1 month f/u)   HPI  [place]in W [place] presents for a lot of bruises, falls due to a poor balance C/o stumbling; falling x3 No LOC  Outpatient Medications Prior to Visit  Medication Sig Dispense Refill   acetaminophen (TYLENOL) 500 MG tablet Take 1 tablet (500 mg total) by mouth every 6 (six) hours as needed for up to 30 doses. 30 tablet 0   atorvastatin (LIPITOR) 40 MG tablet Take 1 tablet (40 mg total) by mouth daily. 90 tablet 3   calcitRIOL (ROCALTROL) 0.25 MCG capsule TAKE 1 CAPSULE BY MOUTH EVERY DAY 90 capsule 1   carvedilol (COREG) 25 MG tablet TAKE 1 TABLET BY MOUTH TWICE A DAY WITH MEALS 180 tablet 1   cetirizine (ZYRTEC) 10 MG tablet Take 10 mg by mouth daily as needed for allergies.     Cholecalciferol 2000 UNITS TABS Take 2,000 Units by mouth daily.     clonazePAM (KLONOPIN) 0.5 MG tablet Take 1 tablet (0.5 mg total) by mouth 2 (two) times daily as needed for anxiety. 60 tablet 3   diclofenac sodium (VOLTAREN) 1 % GEL APPLY 4 G TOPICALLY 4 TIMES DAILY. (Patient taking differently: Apply 4 g topically 4 (four) times daily as needed (pain).) 200 g 5   hydrALAZINE (APRESOLINE) 10 MG tablet Take 1-2 tablet 3 times a day as needed if blood pressure is >170 60 tablet 3   isosorbide mononitrate (IMDUR) 30 MG 24 hr tablet TAKE 1 TABLET BY MOUTH EVERY DAY 90 tablet 2   levothyroxine (SYNTHROID) 175 MCG tablet TAKE 1 TABLET BY MOUTH EVERY DAY 90 tablet 3   lipase/protease/amylase (CREON) 36000 UNITS CPEP capsule Take 2 capsules (72,000 Units total) by mouth 3 (three) times daily with meals. May also take 1 capsule (36,000 Units total) as needed (with snacks). 240 capsule 11   Multiple Vitamins-Minerals (PRESERVISION AREDS 2) CAPS Take 1 capsule by mouth in the morning and at bedtime.     nitroGLYCERIN (NITROSTAT) 0.4 MG SL tablet Place 0.4 mg under the  tongue every 5 (five) minutes as needed for chest pain.     pantoprazole (PROTONIX) 40 MG tablet TAKE 1 TABLET BY MOUTH TWICE A DAY 180 tablet 1   Propylene Glycol (SYSTANE BALANCE) 0.6 % SOLN Place 1 drop into both eyes daily as needed (Dry eye).     SYRINGE-NEEDLE, DISP, 3 ML (BD ECLIPSE SYRINGE) 23G X 1" 3 ML MISC As directed IM 50 each 2   tamsulosin (FLOMAX) 0.4 MG CAPS capsule Take 0.4 mg by mouth daily.     testosterone cypionate (DEPOTESTOSTERONE CYPIONATE) 200 MG/ML injection INJECT INTO MUSCLE EVERY 2 WEEKS 12 mL 1   traMADol (ULTRAM) 50 MG tablet Take 1-2 tablets (50-100 mg total) by mouth every 6 (six) hours as needed for severe pain. 240 tablet 1   predniSONE (DELTASONE) 10 MG tablet Take 1 tablet (10 mg total) by mouth daily with breakfast. 30 tablet 1   apixaban (ELIQUIS) 5 MG TABS tablet Take 1 tablet (5 mg total) by mouth 2 (two) times daily for 7 days. 60 tablet 5   No facility-administered medications prior to visit.    ROS: Review of Systems  Constitutional:  Negative for appetite change, fatigue and unexpected weight change.  HENT:  Negative for congestion, nosebleeds, sneezing, sore throat and trouble swallowing.   Eyes:  Negative for itching and visual disturbance.  Respiratory:  Negative for cough.   Cardiovascular:  Negative for chest pain, palpitations and leg swelling.  Gastrointestinal:  Negative for abdominal distention, blood in stool, diarrhea and nausea.  Genitourinary:  Negative for frequency and hematuria.  Musculoskeletal:  Negative for back pain, gait problem, joint swelling and neck pain.  Skin:  Negative for rash.  Neurological:  Negative for dizziness, tremors, speech difficulty and weakness.  Psychiatric/Behavioral:  Negative for agitation, dysphoric mood and sleep disturbance. The patient is not nervous/anxious.     Objective:  BP 136/82 (BP Location: Left Arm, Patient Position: Sitting, Cuff Size: Large)   Pulse 82   Temp 97.8 F (36.6 C)  (Temporal)   Ht 6' (1.829 m)   Wt 192 lb (87.1 kg)   SpO2 98%   BMI 26.04 kg/m   BP Readings from Last 3 Encounters:  03/14/23 136/82  02/09/23 120/70  01/19/23 139/77    Wt Readings from Last 3 Encounters:  03/14/23 192 lb (87.1 kg)  02/09/23 184 lb (83.5 kg)  01/19/23 188 lb 8 oz (85.5 kg)    Physical Exam Constitutional:      General: He is not in acute distress.    Appearance: He is well-developed. He is obese. He is not toxic-appearing.     Comments: NAD  Eyes:     Conjunctiva/sclera: Conjunctivae normal.     Pupils: Pupils are equal, round, and reactive to light.  Neck:     Thyroid: No thyromegaly.     Vascular: No JVD.  Cardiovascular:     Rate and Rhythm: Normal rate and regular rhythm.     Heart sounds: Normal heart sounds. No murmur heard.    No friction rub. No gallop.  Pulmonary:     Effort: Pulmonary effort is normal. No respiratory distress.     Breath sounds: Normal breath sounds. No wheezing or rales.  Chest:     Chest wall: No tenderness.  Abdominal:     General: Bowel sounds are normal. There is no distension.     Palpations: Abdomen is soft. There is no mass.     Tenderness: There is no abdominal tenderness. There is no guarding or rebound.  Musculoskeletal:        General: No tenderness. Normal range of motion.     Cervical back: Normal range of motion.  Lymphadenopathy:     Cervical: No cervical adenopathy.  Skin:    General: Skin is warm and dry.     Findings: Bruising and rash present.  Neurological:     Mental Status: He is alert and oriented to person, place, and time.     Cranial Nerves: No cranial nerve deficit.     Motor: No abnormal muscle tone.     Coordination: Coordination abnormal.     Gait: Gait abnormal.     Deep Tendon Reflexes: Reflexes are normal and symmetric.  Psychiatric:        Behavior: Behavior normal.        Thought Content: Thought content normal.        Judgment: Judgment normal.   A lot of bruises and scabs  on arms, legs  Lab Results  Component Value Date   WBC 6.7 07/05/2022   HGB 13.5 07/05/2022   HCT 41.6 07/05/2022   PLT 87.0 (L) 07/05/2022   GLUCOSE 126 (H) 01/12/2023   CHOL 143 06/25/2022   TRIG 88 06/25/2022   HDL 42 06/25/2022   LDLCALC 83 06/25/2022  ALT 31 01/12/2023   AST 31 01/12/2023   NA 142 01/12/2023   K 5.3 (H) 01/12/2023   CL 103 01/12/2023   CREATININE 2.06 (H) 01/12/2023   BUN 45 (H) 01/12/2023   CO2 33 (H) 01/12/2023   TSH 0.51 01/12/2023   PSA 2.58 07/05/2022   INR 1.3 (H) 06/23/2022   HGBA1C 6.0 12/28/2021   MICROALBUR 1.2 05/18/2017    VAS [place] ABI WITH/WO TBI  Result Date: 01/18/2023  LOWER EXTREMITY DOPPLER STUDY Patient Name:  HERSHY SMOLIK  Date of Exam:   01/18/2023 Medical Rec #: [id number]      Accession #:    [id number] Date of Birth: 12/17/1940     Patient Gender: M Patient Age:   62 years Exam Location:  [facility] Procedure:      VAS [place] ABI WITH/WO TBI Referring Phys: Cristal Deer DICKSON --------------------------------------------------------------------------------  Indications: Peripheral artery disease. High Risk Factors: Hypertension, hyperlipidemia, no history of smoking, coronary                    artery disease.  Comparison Study: 10/18/2022 ABI/TBI- right=[place]/0.43, left=[place]/0.41 Performing Technologist: Gertie Fey MHA, RVT, RDCS, RDMS  Examination Guidelines: A complete evaluation includes at minimum, Doppler waveform signals and systolic blood pressure reading at the level of bilateral brachial, anterior tibial, and posterior tibial arteries, when vessel segments are accessible. Bilateral testing is considered an integral part of a complete examination. Photoelectric Plethysmograph (PPG) waveforms and toe systolic pressure readings are included as required and additional duplex testing as needed. Limited examinations for reoccurring indications may be performed as noted.  ABI Findings:  +---------+------------------+-----+---------+------------------+ Right    Rt Pressure (mmHg)IndexWaveform Comment            +---------+------------------+-----+---------+------------------+ Brachial 171                                                +---------+------------------+-----+---------+------------------+ PTA      Noncompressible        triphasic                   +---------+------------------+-----+---------+------------------+ DP       Noncompressible        triphasic                   +---------+------------------+-----+---------+------------------+ Great Toe                                Unable to insonate +---------+------------------+-----+---------+------------------+ +---------+------------------+-----+---------+-------+ Left     Lt Pressure (mmHg)IndexWaveform Comment +---------+------------------+-----+---------+-------+ Brachial 135                                     +---------+------------------+-----+---------+-------+ PTA      Noncompressible        triphasic        +---------+------------------+-----+---------+-------+ DP       Noncompressible        triphasic        +---------+------------------+-----+---------+-------+ Great Toe118               0.69                  +---------+------------------+-----+---------+-------+ +-------+---------------+------------------+---------------+------------+ ABI/TBIToday's ABI    Today's TBI  Previous ABI   Previous TBI +-------+---------------+------------------+---------------+------------+ Right  NoncompressibleUnable to insonateNoncompressible0.43         +-------+---------------+------------------+---------------+------------+ Left   Noncompressible0.69              Noncompressible0.41         +-------+---------------+------------------+---------------+------------+  Bilateral ABIs appear essentially unchanged compared to prior study on 10/18/2022.  Summary: Right:  Resting right ankle-brachial index indicates noncompressible right lower extremity arteries. The right toe-brachial index is abnormal. Left: Resting left ankle-brachial index indicates noncompressible left lower extremity arteries. The left toe-brachial index is abnormal. *See table(s) above for measurements and observations.  Electronically signed by Waverly Ferrari MD on 01/18/2023 at 4:01:40 PM.    Final     Assessment & Plan:   Problem List Items Addressed This Visit     Hypothyroidism - Primary    Chronic Cont on Levothroid      Essential hypertension    BP Readings from Last 3 Encounters:  03/14/23 136/82  02/09/23 120/70  01/19/23 139/77  Will adjust BP meds       Relevant Medications   aspirin EC 81 MG tablet   Celiac disease    On Gluten free diet      Thrombocytopenia (HCC)    Monitor cbc      Low back pain    On  Prednisone.- 10 mg/d  Potential benefits of a long term steroid  use as well as potential risks  and complications were explained to the patient and were aknowledged.       Relevant Medications   aspirin EC 81 MG tablet   predniSONE (DELTASONE) 10 MG tablet   CKD (chronic kidney disease) stage 3, GFR 30-59 ml/min (HCC)    Monitor GFR      PMR (polymyalgia rheumatica) (HCC)    On Prednisone 10 mg/d pc. Not better. Start Prednisone 40 mg/d       DVT (deep venous thrombosis) (HCC)    Will d/c Eliquis due to falls and multiple bruises - wean off in 2 wks EC ASA 81 mg bid      Relevant Medications   aspirin EC 81 MG tablet   Gait disorder    Start thinking about moving to a one level townhome         Meds ordered this encounter  Medications   aspirin EC 81 MG tablet    Sig: Take 1 tablet (81 mg total) by mouth 2 (two) times daily.    Dispense:  100 tablet    Refill:  3   predniSONE (DELTASONE) 10 MG tablet    Sig: Take 4 tablets (40 mg total) by mouth daily with breakfast.    Dispense:  100 tablet    Refill:  3       Follow-up: Return in about 2 weeks (around 03/28/2023) for a follow-up visit.  Sonda Primes, MD

## 2023-03-14 NOTE — Assessment & Plan Note (Signed)
Will d/c Eliquis due to falls and multiple bruises - wean off in 2 wks EC ASA 81 mg bid

## 2023-03-14 NOTE — Assessment & Plan Note (Signed)
Monitor cbc 

## 2023-03-14 NOTE — Assessment & Plan Note (Signed)
Chronic Cont on Levothroid 

## 2023-03-14 NOTE — Assessment & Plan Note (Signed)
Monitor GFR 

## 2023-03-14 NOTE — Assessment & Plan Note (Signed)
Start thinking about moving to a one level townhome

## 2023-03-16 ENCOUNTER — Encounter: Payer: Self-pay | Admitting: Internal Medicine

## 2023-04-05 ENCOUNTER — Other Ambulatory Visit: Payer: Self-pay | Admitting: Internal Medicine

## 2023-04-06 ENCOUNTER — Encounter: Payer: Self-pay | Admitting: Internal Medicine

## 2023-04-06 ENCOUNTER — Ambulatory Visit (INDEPENDENT_AMBULATORY_CARE_PROVIDER_SITE_OTHER): Payer: PPO | Admitting: Internal Medicine

## 2023-04-06 VITALS — BP 110/60 | HR 54 | Temp 98.6°F | Ht 72.0 in | Wt 191.0 lb

## 2023-04-06 DIAGNOSIS — R609 Edema, unspecified: Secondary | ICD-10-CM | POA: Diagnosis not present

## 2023-04-06 DIAGNOSIS — R269 Unspecified abnormalities of gait and mobility: Secondary | ICD-10-CM

## 2023-04-06 DIAGNOSIS — E034 Atrophy of thyroid (acquired): Secondary | ICD-10-CM

## 2023-04-06 DIAGNOSIS — I10 Essential (primary) hypertension: Secondary | ICD-10-CM

## 2023-04-06 DIAGNOSIS — G8929 Other chronic pain: Secondary | ICD-10-CM | POA: Diagnosis not present

## 2023-04-06 DIAGNOSIS — M5441 Lumbago with sciatica, right side: Secondary | ICD-10-CM

## 2023-04-06 DIAGNOSIS — M353 Polymyalgia rheumatica: Secondary | ICD-10-CM

## 2023-04-06 DIAGNOSIS — M5386 Other specified dorsopathies, lumbar region: Secondary | ICD-10-CM | POA: Diagnosis not present

## 2023-04-06 DIAGNOSIS — M5442 Lumbago with sciatica, left side: Secondary | ICD-10-CM

## 2023-04-06 LAB — COMPREHENSIVE METABOLIC PANEL
ALT: 31 U/L (ref 0–53)
AST: 32 U/L (ref 0–37)
Albumin: 3.1 g/dL — ABNORMAL LOW (ref 3.5–5.2)
Alkaline Phosphatase: 92 U/L (ref 39–117)
BUN: 31 mg/dL — ABNORMAL HIGH (ref 6–23)
CO2: 33 mEq/L — ABNORMAL HIGH (ref 19–32)
Calcium: 8.9 mg/dL (ref 8.4–10.5)
Chloride: 102 mEq/L (ref 96–112)
Creatinine, Ser: 2.15 mg/dL — ABNORMAL HIGH (ref 0.40–1.50)
GFR: 28.16 mL/min — ABNORMAL LOW (ref >60.00)
Glucose, Bld: 86 mg/dL (ref 70–99)
Potassium: 5.1 mEq/L (ref 3.5–5.1)
Sodium: 140 mEq/L (ref 135–145)
Total Bilirubin: 0.6 mg/dL (ref 0.2–1.2)
Total Protein: 5.6 g/dL — ABNORMAL LOW (ref 6.0–8.3)

## 2023-04-06 LAB — HEMOGLOBIN A1C: Hgb A1c MFr Bld: 6.4 % (ref 4.6–6.5)

## 2023-04-06 LAB — CK: Total CK: 75 U/L (ref 7–232)

## 2023-04-06 LAB — TSH: TSH: 2.89 u[IU]/mL (ref 0.35–5.50)

## 2023-04-06 LAB — T4, FREE: Free T4: 1.39 ng/dL (ref 0.60–1.60)

## 2023-04-06 MED ORDER — KETOCONAZOLE 2 % EX CREA: 1.0000 | TOPICAL_CREAM | Freq: Two times a day (BID) | CUTANEOUS | 1 refills | Status: DC

## 2023-04-06 MED ORDER — PREDNISONE 10 MG PO TABS: 30.0000 mg | ORAL_TABLET | Freq: Every day | ORAL | 3 refills | Status: DC

## 2023-04-06 NOTE — Assessment & Plan Note (Signed)
Worse F/u w/Dr Shon Baton

## 2023-04-06 NOTE — Assessment & Plan Note (Signed)
PMR - worse. Steroids (40 mg/d) did not help C/o pain in the R ear C/o weak legs; no falls C/o R ear pain

## 2023-04-06 NOTE — Assessment & Plan Note (Signed)
Chronic Cont on Levothroid 

## 2023-04-06 NOTE — Assessment & Plan Note (Signed)
PMR -  worse. Steroids (40 mg/d) did not help; upper legs weakness, hard to get up from a chair C/o weak legs; no falls C/o R ear pain

## 2023-04-06 NOTE — Assessment & Plan Note (Signed)
Not better per pt Use compression socks Elevate legs at night and periodically during the day Post-phlebitic syndrome LLE

## 2023-04-06 NOTE — Assessment & Plan Note (Signed)
BP Readings from Last 3 Encounters:  04/06/23 110/60  03/14/23 136/82  02/09/23 120/70  Will adjust BP meds

## 2023-04-06 NOTE — Progress Notes (Signed)
Subjective:  Patient ID: Scott Oneal, male    DOB: July 24, 1941  Age: 82 y.o. MRN: [id number]  CC: Follow-up (2 [facility] F/U)   HPI KERRI BHATTI presents for PMR - worse. Steroids (40 mg/d) did not help C/o pain in the R ear C/o weak legs; no falls C/o R ear pain  C/o upper legs weakness, hard to get up from a chair C/o LBP on the L  Rash on the R >>L foot x 1 year  Outpatient Medications Prior to Visit  Medication Sig Dispense Refill   acetaminophen (TYLENOL) 500 MG tablet Take 1 tablet (500 mg total) by mouth every 6 (six) hours as needed for up to 30 doses. 30 tablet 0   aspirin EC 81 MG tablet Take 1 tablet (81 mg total) by mouth 2 (two) times daily. 100 tablet 3   atorvastatin (LIPITOR) 40 MG tablet Take 1 tablet (40 mg total) by mouth daily. 90 tablet 3   calcitRIOL (ROCALTROL) 0.25 MCG capsule TAKE 1 CAPSULE BY MOUTH EVERY DAY 90 capsule 1   carvedilol (COREG) 25 MG tablet TAKE 1 TABLET BY MOUTH TWICE A DAY WITH MEALS 180 tablet 1   cetirizine (ZYRTEC) 10 MG tablet Take 10 mg by mouth daily as needed for allergies.     Cholecalciferol 2000 UNITS TABS Take 2,000 Units by mouth daily.     clonazePAM (KLONOPIN) 0.5 MG tablet Take 1 tablet (0.5 mg total) by mouth 2 (two) times daily as needed for anxiety. 60 tablet 3   diclofenac sodium (VOLTAREN) 1 % GEL APPLY 4 G TOPICALLY 4 TIMES DAILY. (Patient taking differently: Apply 4 g topically 4 (four) times daily as needed (pain).) 200 g 5   hydrALAZINE (APRESOLINE) 10 MG tablet Take 1-2 tablet 3 times a day as needed if blood pressure is >170 60 tablet 3   isosorbide mononitrate (IMDUR) 30 MG 24 hr tablet TAKE 1 TABLET BY MOUTH EVERY DAY 90 tablet 2   levothyroxine (SYNTHROID) 175 MCG tablet TAKE 1 TABLET BY MOUTH EVERY DAY 90 tablet 3   lipase/protease/amylase (CREON) 36000 UNITS CPEP capsule Take 2 capsules (72,000 Units total) by mouth 3 (three) times daily with meals. May also take 1 capsule (36,000 Units total) as needed (with  snacks). 240 capsule 11   Multiple Vitamins-Minerals (PRESERVISION AREDS 2) CAPS Take 1 capsule by mouth in the morning and at bedtime.     nitroGLYCERIN (NITROSTAT) 0.4 MG SL tablet Place 0.4 mg under the tongue every 5 (five) minutes as needed for chest pain.     pantoprazole (PROTONIX) 40 MG tablet TAKE 1 TABLET BY MOUTH TWICE A DAY 180 tablet 1   Propylene Glycol (SYSTANE BALANCE) 0.6 % SOLN Place 1 drop into both eyes daily as needed (Dry eye).     SYRINGE-NEEDLE, DISP, 3 ML (BD ECLIPSE SYRINGE) 23G X 1" 3 ML MISC As directed IM 50 each 2   tamsulosin (FLOMAX) 0.4 MG CAPS capsule Take 0.4 mg by mouth daily.     testosterone cypionate (DEPOTESTOSTERONE CYPIONATE) 200 MG/ML injection INJECT INTO MUSCLE EVERY 2 WEEKS 12 mL 1   traMADol (ULTRAM) 50 MG tablet Take 1-2 tablets (50-100 mg total) by mouth every 6 (six) hours as needed for severe pain. 240 tablet 1   predniSONE (DELTASONE) 10 MG tablet Take 4 tablets (40 mg total) by mouth daily with breakfast. 100 tablet 3   No facility-administered medications prior to visit.    ROS: Review of Systems  Constitutional:  Positive for fatigue. Negative for appetite change and unexpected weight change.  HENT:  Negative for congestion, nosebleeds, sneezing, sore throat and trouble swallowing.   Eyes:  Negative for itching and visual disturbance.  Respiratory:  Negative for cough.   Cardiovascular:  Negative for chest pain, palpitations and leg swelling.  Gastrointestinal:  Negative for abdominal distention, blood in stool, diarrhea and nausea.  Genitourinary:  Negative for frequency and hematuria.  Musculoskeletal:  Positive for arthralgias, back pain and gait problem. Negative for joint swelling and neck pain.  Skin:  Positive for color change and rash.  Neurological:  Positive for weakness. Negative for dizziness, tremors and speech difficulty.  Psychiatric/Behavioral:  Negative for agitation, dysphoric mood, sleep disturbance and suicidal  ideas. The patient is not nervous/anxious.     Objective:  BP 110/60 (BP Location: Left Arm, Patient Position: Sitting, Cuff Size: Large)   Pulse (!) 54   Temp 98.6 F (37 C) (Oral)   Ht 6' (1.829 m)   Wt 191 lb (86.6 kg)   SpO2 97%   BMI 25.90 kg/m   BP Readings from Last 3 Encounters:  04/06/23 110/60  03/14/23 136/82  02/09/23 120/70    Wt Readings from Last 3 Encounters:  04/06/23 191 lb (86.6 kg)  03/14/23 192 lb (87.1 kg)  02/09/23 184 lb (83.5 kg)    Physical Exam Constitutional:      General: He is not in acute distress.    Appearance: He is well-developed. He is obese. He is ill-appearing. He is not toxic-appearing.     Comments: NAD  Eyes:     Conjunctiva/sclera: Conjunctivae normal.     Pupils: Pupils are equal, round, and reactive to light.  Neck:     Thyroid: No thyromegaly.     Vascular: No JVD.  Cardiovascular:     Rate and Rhythm: Normal rate and regular rhythm.     Heart sounds: Normal heart sounds. No murmur heard.    No friction rub. No gallop.  Pulmonary:     Effort: Pulmonary effort is normal. No respiratory distress.     Breath sounds: Normal breath sounds. No wheezing or rales.  Chest:     Chest wall: No tenderness.  Abdominal:     General: Bowel sounds are normal. There is no distension.     Palpations: Abdomen is soft. There is no mass.     Tenderness: There is no abdominal tenderness. There is no guarding or rebound.  Musculoskeletal:        General: No tenderness. Normal range of motion.     Cervical back: Normal range of motion.     Right lower leg: Edema present.     Left lower leg: Edema present.  Lymphadenopathy:     Cervical: No cervical adenopathy.  Skin:    General: Skin is warm and dry.     Findings: Bruising and rash present.  Neurological:     Mental Status: He is alert and oriented to person, place, and time.     Cranial Nerves: No cranial nerve deficit.     Motor: Weakness present. No abnormal muscle tone.      Coordination: Coordination abnormal.     Gait: Gait abnormal.     Deep Tendon Reflexes: Reflexes are normal and symmetric.  Psychiatric:        Behavior: Behavior normal.        Thought Content: Thought content normal.        Judgment: Judgment normal.   1 +  feet edema Rash on feet - ?T cruris Weak upper legs Using a cane  Lab Results  Component Value Date   WBC 6.7 07/05/2022   HGB 13.5 07/05/2022   HCT 41.6 07/05/2022   PLT 87.0 (L) 07/05/2022   GLUCOSE 126 (H) 01/12/2023   CHOL 143 06/25/2022   TRIG 88 06/25/2022   HDL 42 06/25/2022   LDLCALC 83 06/25/2022   ALT 31 01/12/2023   AST 31 01/12/2023   NA 142 01/12/2023   K 5.3 (H) 01/12/2023   CL 103 01/12/2023   CREATININE 2.06 (H) 01/12/2023   BUN 45 (H) 01/12/2023   CO2 33 (H) 01/12/2023   TSH 0.51 01/12/2023   PSA 2.58 07/05/2022   INR 1.3 (H) 06/23/2022   HGBA1C 6.0 12/28/2021   MICROALBUR 1.2 05/18/2017    VAS [place] ABI WITH/WO TBI  Result Date: 01/18/2023  LOWER EXTREMITY DOPPLER STUDY Patient Name:  ILIAN GUIZAR  Date of Exam:   01/18/2023 Medical Rec #: [id number]      Accession #:    [id number] Date of Birth: 1941-04-30     Patient Gender: M Patient Age:   18 years Exam Location:  [facility] Procedure:      VAS [place] ABI WITH/WO TBI Referring Phys: Cristal Deer DICKSON --------------------------------------------------------------------------------  Indications: Peripheral artery disease. High Risk Factors: Hypertension, hyperlipidemia, no history of smoking, coronary                    artery disease.  Comparison Study: 10/18/2022 ABI/TBI- right=[place]/0.43, left=[place]/0.41 Performing Technologist: Gertie Fey MHA, RVT, RDCS, RDMS  Examination Guidelines: A complete evaluation includes at minimum, Doppler waveform signals and systolic blood pressure reading at the level of bilateral brachial, anterior tibial, and posterior tibial arteries, when vessel segments are accessible. Bilateral testing is  considered an integral part of a complete examination. Photoelectric Plethysmograph (PPG) waveforms and toe systolic pressure readings are included as required and additional duplex testing as needed. Limited examinations for reoccurring indications may be performed as noted.  ABI Findings: +---------+------------------+-----+---------+------------------+ Right    Rt Pressure (mmHg)IndexWaveform Comment            +---------+------------------+-----+---------+------------------+ Brachial 171                                                +---------+------------------+-----+---------+------------------+ PTA      Noncompressible        triphasic                   +---------+------------------+-----+---------+------------------+ DP       Noncompressible        triphasic                   +---------+------------------+-----+---------+------------------+ Great Toe                                Unable to insonate +---------+------------------+-----+---------+------------------+ +---------+------------------+-----+---------+-------+ Left     Lt Pressure (mmHg)IndexWaveform Comment +---------+------------------+-----+---------+-------+ Brachial 135                                     +---------+------------------+-----+---------+-------+ PTA      Noncompressible        triphasic        +---------+------------------+-----+---------+-------+  DP       Noncompressible        triphasic        +---------+------------------+-----+---------+-------+ Randie Heinz Toe118               0.69                  +---------+------------------+-----+---------+-------+ +-------+---------------+------------------+---------------+------------+ ABI/TBIToday's ABI    Today's TBI       Previous ABI   Previous TBI +-------+---------------+------------------+---------------+------------+ Right  NoncompressibleUnable to insonateNoncompressible0.43          +-------+---------------+------------------+---------------+------------+ Left   Noncompressible0.69              Noncompressible0.41         +-------+---------------+------------------+---------------+------------+  Bilateral ABIs appear essentially unchanged compared to prior study on 10/18/2022.  Summary: Right: Resting right ankle-brachial index indicates noncompressible right lower extremity arteries. The right toe-brachial index is abnormal. Left: Resting left ankle-brachial index indicates noncompressible left lower extremity arteries. The left toe-brachial index is abnormal. *See table(s) above for measurements and observations.  Electronically signed by Waverly Ferrari MD on 01/18/2023 at 4:01:40 PM.    Final     Assessment & Plan:   Problem List Items Addressed This Visit     Hypothyroidism    Chronic Cont on Levothroid      Relevant Orders   Comprehensive metabolic panel   CK   TSH   T4, free   Hemoglobin A1c   Essential hypertension    BP Readings from Last 3 Encounters:  04/06/23 110/60  03/14/23 136/82  02/09/23 120/70  Will adjust BP meds       Relevant Orders   Comprehensive metabolic panel   CK   TSH   T4, free   Hemoglobin A1c   Low back pain   Relevant Medications   predniSONE (DELTASONE) 10 MG tablet   Other Relevant Orders   Ambulatory referral to Orthopedic Surgery   Edema    Not better per pt Use compression socks Elevate legs at night and periodically during the day Post-phlebitic syndrome LLE      Sciatica associated with disorder of lumbar spine    Worse F/u w/Dr Shon Baton      PMR (polymyalgia rheumatica) (HCC)     PMR -  worse. Steroids (40 mg/d) did not help; upper legs weakness, hard to get up from a chair C/o weak legs; no falls C/o R ear pain      Relevant Orders   Comprehensive metabolic panel   CK   TSH   T4, free   Hemoglobin A1c   Gait disorder - Primary     PMR - worse. Steroids (40 mg/d) did not help C/o pain  in the R ear C/o weak legs; no falls C/o R ear pain      Relevant Orders   Comprehensive metabolic panel   CK   TSH   T4, free   Hemoglobin A1c      Meds ordered this encounter  Medications   ketoconazole (NIZORAL) 2 % cream    Sig: Apply 1 Application topically 2 (two) times daily. Foot rash    Dispense:  45 g    Refill:  1   predniSONE (DELTASONE) 10 MG tablet    Sig: Take 3 tablets (30 mg total) by mouth daily with breakfast.    Dispense:  90 tablet    Refill:  3      Follow-up: Return in about 4 weeks (around 05/04/2023) for a follow-up visit.  Alex  Abagael Kramm, MD

## 2023-04-08 ENCOUNTER — Other Ambulatory Visit: Payer: Self-pay | Admitting: Internal Medicine

## 2023-04-11 ENCOUNTER — Other Ambulatory Visit: Payer: Self-pay | Admitting: Internal Medicine

## 2023-04-11 DIAGNOSIS — K8689 Other specified diseases of pancreas: Secondary | ICD-10-CM

## 2023-04-11 DIAGNOSIS — K219 Gastro-esophageal reflux disease without esophagitis: Secondary | ICD-10-CM

## 2023-04-12 ENCOUNTER — Other Ambulatory Visit: Payer: Self-pay | Admitting: Internal Medicine

## 2023-04-12 MED ORDER — TESTOSTERONE CYPIONATE 200 MG/ML IM SOLN: INTRAMUSCULAR | 1 refills | Status: DC

## 2023-04-28 DIAGNOSIS — M5451 Vertebrogenic low back pain: Secondary | ICD-10-CM | POA: Diagnosis not present

## 2023-04-30 ENCOUNTER — Other Ambulatory Visit: Payer: Self-pay | Admitting: Internal Medicine

## 2023-05-08 ENCOUNTER — Ambulatory Visit: Payer: PPO | Admitting: Internal Medicine

## 2023-05-08 ENCOUNTER — Telehealth: Payer: Self-pay

## 2023-05-08 ENCOUNTER — Encounter: Payer: Self-pay | Admitting: Internal Medicine

## 2023-05-08 VITALS — BP 138/80 | HR 69 | Temp 97.8°F | Ht 72.0 in | Wt 193.0 lb

## 2023-05-08 DIAGNOSIS — R609 Edema, unspecified: Secondary | ICD-10-CM | POA: Diagnosis not present

## 2023-05-08 DIAGNOSIS — M7989 Other specified soft tissue disorders: Secondary | ICD-10-CM

## 2023-05-08 DIAGNOSIS — M79605 Pain in left leg: Secondary | ICD-10-CM | POA: Diagnosis not present

## 2023-05-08 DIAGNOSIS — M5442 Lumbago with sciatica, left side: Secondary | ICD-10-CM

## 2023-05-08 DIAGNOSIS — G8929 Other chronic pain: Secondary | ICD-10-CM

## 2023-05-08 MED ORDER — OXYCODONE-ACETAMINOPHEN 10-325 MG PO TABS: 0.5000 | ORAL_TABLET | Freq: Four times a day (QID) | ORAL | 0 refills | Status: DC | PRN

## 2023-05-08 NOTE — Patient Instructions (Signed)
Restart Eliquis today

## 2023-05-08 NOTE — Telephone Encounter (Signed)
Caller: Patient  Concern: Pt woke up at approx 0200 this AM with pain below his knee, extending up into his thigh. He has concerns for DVT, but he denies redness, warmth, or hardened areas. He states it is more of a soreness. He is still taking his Eliquis. He has an appt on 8/1 and a PCP appt today.  Location: left leg  Description: sudden  Quality:  soreness  Resolution: Instructed patient to contact primary care MD and Instructed to call back if symptoms perist  Next Appt: keep 8/1 appt and make earlier appt if PCP thinks necessary

## 2023-05-08 NOTE — Progress Notes (Signed)
Subjective:  Patient ID: Scott Oneal, male    DOB: Dec 13, 1940  Age: 82 y.o. MRN: [id number]  CC: Follow-up (4 WEEK F/U, pt states he is having pain in left left leg from mid calf to mid thigh area... Pt has redness and swelling in lt foot up into mid calf/shin area with open wound on lt shin. Pt denies chest pains and SOB.)   HPI Mohmmad W Beshara presents for LBP irrad to the LLE [place] woke up at 2 am w/severe L thigh and L knee pain. Worse w/standing, can't walk Taking Tylenol and Tramadol 100 mg bid No CP, no SOB Pt stopped Eliquis 1 month ago  Outpatient Medications Prior to Visit  Medication Sig Dispense Refill  . acetaminophen (TYLENOL) 500 MG tablet Take 1 tablet (500 mg total) by mouth every 6 (six) hours as needed for up to 30 doses. 30 tablet 0  . aspirin EC 81 MG tablet Take 1 tablet (81 mg total) by mouth 2 (two) times daily. 100 tablet 3  . atorvastatin (LIPITOR) 40 MG tablet Take 1 tablet (40 mg total) by mouth daily. 90 tablet 3  . calcitRIOL (ROCALTROL) 0.25 MCG capsule TAKE 1 CAPSULE BY MOUTH EVERY DAY 90 capsule 1  . carvedilol (COREG) 25 MG tablet TAKE 1 TABLET BY MOUTH TWICE A DAY WITH MEALS 180 tablet 1  . cetirizine (ZYRTEC) 10 MG tablet Take 10 mg by mouth daily as needed for allergies.    . Cholecalciferol 2000 UNITS TABS Take 2,000 Units by mouth daily.    . clonazePAM (KLONOPIN) 0.5 MG tablet Take 1 tablet (0.5 mg total) by mouth 2 (two) times daily as needed for anxiety. 60 tablet 3  . diclofenac sodium (VOLTAREN) 1 % GEL APPLY 4 G TOPICALLY 4 TIMES DAILY. (Patient taking differently: Apply 4 g topically 4 (four) times daily as needed (pain).) 200 g 5  . hydrALAZINE (APRESOLINE) 10 MG tablet Take 1-2 tablet 3 times a day as needed if blood pressure is >170 60 tablet 3  . isosorbide mononitrate (IMDUR) 30 MG 24 hr tablet TAKE 1 TABLET BY MOUTH EVERY DAY 90 tablet 2  . ketoconazole (NIZORAL) 2 % cream Apply 1 Application topically 2 (two) times daily. Foot rash 45  g 1  . levothyroxine (SYNTHROID) 175 MCG tablet TAKE 1 TABLET BY MOUTH EVERY DAY 90 tablet 3  . lipase/protease/amylase (CREON) 36000 UNITS CPEP capsule TAKE 2 CAPSULES BY MOUTH 3 TIMES A DAY WITH MEALS, MAY TAKE 1 CAPSULE WITH SNACKS AS NEEDED 300 capsule 5  . Multiple Vitamins-Minerals (PRESERVISION AREDS 2) CAPS Take 1 capsule by mouth in the morning and at bedtime.    . nitroGLYCERIN (NITROSTAT) 0.4 MG SL tablet Place 0.4 mg under the tongue every 5 (five) minutes as needed for chest pain.    . pantoprazole (PROTONIX) 40 MG tablet TAKE 1 TABLET BY MOUTH TWICE A DAY 180 tablet 1  . predniSONE (DELTASONE) 10 MG tablet Take 3 tablets (30 mg total) by mouth daily with breakfast. 90 tablet 3  . Propylene Glycol (SYSTANE BALANCE) 0.6 % SOLN Place 1 drop into both eyes daily as needed (Dry eye).    . SYRINGE-NEEDLE, DISP, 3 ML (BD ECLIPSE SYRINGE) 23G X 1" 3 ML MISC As directed IM 50 each 2  . tamsulosin (FLOMAX) 0.4 MG CAPS capsule Take 0.4 mg by mouth daily.    Marland Kitchen testosterone cypionate (DEPOTESTOSTERONE CYPIONATE) 200 MG/ML injection INJECT INTO MUSCLE EVERY 2 WEEKS 12 mL 1  .  traMADol (ULTRAM) 50 MG tablet Take 1-2 tablets (50-100 mg total) by mouth every 6 (six) hours as needed for severe pain. 240 tablet 1   No facility-administered medications prior to visit.    ROS: Review of Systems  Objective:  BP 138/80 (BP Location: Left Arm, Patient Position: Sitting, Cuff Size: Normal)   Pulse 69   Temp 97.8 F (36.6 C) (Oral)   Ht 6' (1.829 m)   Wt 193 lb (87.5 kg)   SpO2 98%   BMI 26.18 kg/m   BP Readings from Last 3 Encounters:  05/08/23 138/80  04/06/23 110/60  03/14/23 136/82    Wt Readings from Last 3 Encounters:  05/08/23 193 lb (87.5 kg)  04/06/23 191 lb (86.6 kg)  03/14/23 192 lb (87.1 kg)    Physical Exam  Lab Results  Component Value Date   WBC 6.7 07/05/2022   HGB 13.5 07/05/2022   HCT 41.6 07/05/2022   PLT 87.0 (L) 07/05/2022   GLUCOSE 86 04/06/2023   CHOL  143 06/25/2022   TRIG 88 06/25/2022   HDL 42 06/25/2022   LDLCALC 83 06/25/2022   ALT 31 04/06/2023   AST 32 04/06/2023   NA 140 04/06/2023   K 5.1 04/06/2023   CL 102 04/06/2023   CREATININE 2.15 (H) 04/06/2023   BUN 31 (H) 04/06/2023   CO2 33 (H) 04/06/2023   TSH 2.89 04/06/2023   PSA 2.58 07/05/2022   INR 1.3 (H) 06/23/2022   HGBA1C 6.4 04/06/2023   MICROALBUR 1.2 05/18/2017    VAS [place] ABI WITH/WO TBI  Result Date: 01/18/2023  LOWER EXTREMITY DOPPLER STUDY Patient Name:  BLAKLEY LAVERDE  Date of Exam:   01/18/2023 Medical Rec #: [id number]      Accession #:    [id number] Date of Birth: 22-Oct-1941     Patient Gender: M Patient Age:   56 years Exam Location:  [facility] Procedure:      VAS [place] ABI WITH/WO TBI Referring Phys: Cristal Deer DICKSON --------------------------------------------------------------------------------  Indications: Peripheral artery disease. High Risk Factors: Hypertension, hyperlipidemia, no history of smoking, coronary                    artery disease.  Comparison Study: 10/18/2022 ABI/TBI- right=[place]/0.43, left=[place]/0.41 Performing Technologist: Gertie Fey MHA, RVT, RDCS, RDMS  Examination Guidelines: A complete evaluation includes at minimum, Doppler waveform signals and systolic blood pressure reading at the level of bilateral brachial, anterior tibial, and posterior tibial arteries, when vessel segments are accessible. Bilateral testing is considered an integral part of a complete examination. Photoelectric Plethysmograph (PPG) waveforms and toe systolic pressure readings are included as required and additional duplex testing as needed. Limited examinations for reoccurring indications may be performed as noted.  ABI Findings: +---------+------------------+-----+---------+------------------+ Right    Rt Pressure (mmHg)IndexWaveform Comment            +---------+------------------+-----+---------+------------------+ Brachial 171                                                 +---------+------------------+-----+---------+------------------+ PTA      Noncompressible        triphasic                   +---------+------------------+-----+---------+------------------+ DP       Noncompressible        triphasic                   +---------+------------------+-----+---------+------------------+  Great Toe                                Unable to insonate +---------+------------------+-----+---------+------------------+ +---------+------------------+-----+---------+-------+ Left     Lt Pressure (mmHg)IndexWaveform Comment +---------+------------------+-----+---------+-------+ Brachial 135                                     +---------+------------------+-----+---------+-------+ PTA      Noncompressible        triphasic        +---------+------------------+-----+---------+-------+ DP       Noncompressible        triphasic        +---------+------------------+-----+---------+-------+ [place]               0.69                  +---------+------------------+-----+---------+-------+ +-------+---------------+------------------+---------------+------------+ ABI/TBIToday's ABI    Today's TBI       Previous ABI   Previous TBI +-------+---------------+------------------+---------------+------------+ Right  NoncompressibleUnable to insonateNoncompressible0.43         +-------+---------------+------------------+---------------+------------+ Left   Noncompressible0.69              Noncompressible0.41         +-------+---------------+------------------+---------------+------------+  Bilateral ABIs appear essentially unchanged compared to prior study on 10/18/2022.  Summary: Right: Resting right ankle-brachial index indicates noncompressible right lower extremity arteries. The right toe-brachial index is abnormal. Left: Resting left ankle-brachial index indicates noncompressible left lower extremity  arteries. The left toe-brachial index is abnormal. *See table(s) above for measurements and observations.  Electronically signed by Waverly Ferrari MD on 01/18/2023 at 4:01:40 PM.    Final     Assessment & Plan:   Problem List Items Addressed This Visit   None     No orders of the defined types were placed in this encounter.     Follow-up: No follow-ups on file.  Sonda Primes, MD

## 2023-05-08 NOTE — Assessment & Plan Note (Signed)
Worse Take Tramadol 100 mg qid for now Take Tylenol 650 mg 4 times a day as needed  Scott Oneal is seeing Dr. Shon Baton.  Dr. Shon Baton referred him to see Dr. Yevette Edwards for the second opinion. Scott Oneal woke up at 2 am w/severe L distal thigh and L knee pain. Worse w/standing, can't walk.  The pain is 9 out of 10 with standing or walking.  It is 5 out of 10 at rest. Taking Tylenol and Tramadol 100 mg bid.  Oxycodone prescription given to use with caution.  Tylenol as needed.  When out of oxycodone can use tramadol 50-100 4 times daily as needed

## 2023-05-09 ENCOUNTER — Ambulatory Visit (HOSPITAL_COMMUNITY)
Admission: RE | Admit: 2023-05-09 | Discharge: 2023-05-09 | Disposition: A | Discharge status: Home / Self Care | Payer: PPO | Source: Ambulatory Visit | Attending: Vascular Surgery | Admitting: Vascular Surgery

## 2023-05-09 ENCOUNTER — Encounter: Payer: Self-pay | Admitting: Internal Medicine

## 2023-05-09 DIAGNOSIS — R609 Edema, unspecified: Secondary | ICD-10-CM | POA: Diagnosis not present

## 2023-05-09 DIAGNOSIS — M7989 Other specified soft tissue disorders: Secondary | ICD-10-CM | POA: Insufficient documentation

## 2023-05-09 DIAGNOSIS — M79605 Pain in left leg: Secondary | ICD-10-CM | POA: Diagnosis not present

## 2023-05-09 NOTE — Assessment & Plan Note (Signed)
LLE. Rule out DVT. Pt stopped Eliquis 1 month ago due to frequent falls and bruises.  She has not been falling.  Bruising resolved. Restart Eliquis tonight.  Obtain Doppler venous ultrasound tomorrow.  Elevate leg

## 2023-05-09 NOTE — Assessment & Plan Note (Signed)
Radiculopathy versus other.  Rule out DVT. Pt stopped Eliquis 1 month ago due to frequent falls and bruises.  She has not been falling.  Bruising resolved. Restart Eliquis tonight.  Obtain Doppler venous ultrasound tomorrow.  Elevate leg

## 2023-05-15 ENCOUNTER — Ambulatory Visit (INDEPENDENT_AMBULATORY_CARE_PROVIDER_SITE_OTHER): Payer: PPO | Admitting: Internal Medicine

## 2023-05-15 ENCOUNTER — Encounter: Payer: Self-pay | Admitting: Internal Medicine

## 2023-05-15 VITALS — BP 118/60 | HR 76 | Temp 98.3°F | Ht 72.0 in | Wt 194.0 lb

## 2023-05-15 DIAGNOSIS — G250 Essential tremor: Secondary | ICD-10-CM

## 2023-05-15 DIAGNOSIS — M353 Polymyalgia rheumatica: Secondary | ICD-10-CM | POA: Diagnosis not present

## 2023-05-15 DIAGNOSIS — G8929 Other chronic pain: Secondary | ICD-10-CM | POA: Diagnosis not present

## 2023-05-15 DIAGNOSIS — M5442 Lumbago with sciatica, left side: Secondary | ICD-10-CM

## 2023-05-15 DIAGNOSIS — N1832 Chronic kidney disease, stage 3b: Secondary | ICD-10-CM

## 2023-05-15 DIAGNOSIS — I82422 Acute embolism and thrombosis of left iliac vein: Secondary | ICD-10-CM | POA: Diagnosis not present

## 2023-05-15 DIAGNOSIS — I87009 Postthrombotic syndrome without complications of unspecified extremity: Secondary | ICD-10-CM | POA: Diagnosis not present

## 2023-05-15 DIAGNOSIS — Z7901 Long term (current) use of anticoagulants: Secondary | ICD-10-CM

## 2023-05-15 DIAGNOSIS — K219 Gastro-esophageal reflux disease without esophagitis: Secondary | ICD-10-CM | POA: Diagnosis not present

## 2023-05-15 DIAGNOSIS — J300 Vasomotor rhinitis: Secondary | ICD-10-CM | POA: Diagnosis not present

## 2023-05-15 DIAGNOSIS — K900 Celiac disease: Secondary | ICD-10-CM | POA: Diagnosis not present

## 2023-05-15 MED ORDER — OXYCODONE-ACETAMINOPHEN 10-325 MG PO TABS: 0.5000 | ORAL_TABLET | Freq: Four times a day (QID) | ORAL | 0 refills | Status: DC | PRN

## 2023-05-15 MED ORDER — APIXABAN 2.5 MG PO TABS: 2.5000 mg | ORAL_TABLET | Freq: Two times a day (BID) | ORAL | 5 refills | Status: DC

## 2023-05-15 MED ORDER — IPRATROPIUM BROMIDE 0.03 % NA SOLN: 2.0000 | Freq: Three times a day (TID) | NASAL | 3 refills | Status: DC

## 2023-05-15 NOTE — Assessment & Plan Note (Signed)
Cont w/Protonix BID

## 2023-05-15 NOTE — Assessment & Plan Note (Addendum)
LBP - not better, but controlled better w/Oxycodone ( taking one bid) and Prednisone 20 mg/d [name] is seeing Dr. Shon Baton.  Dr. Shon Baton referred him to see Dr. Yevette Edwards for the second opinion this week.

## 2023-05-15 NOTE — Assessment & Plan Note (Addendum)
New Start Atrovent nasal

## 2023-05-15 NOTE — Assessment & Plan Note (Signed)
PMR. Steroids (Deltasone 20 mg/d)

## 2023-05-15 NOTE — Assessment & Plan Note (Signed)
 Monitor GFR 

## 2023-05-15 NOTE — Assessment & Plan Note (Signed)
Reduce Eliquis to 2.5 mg bid in 1 mo due to bruising EC ASA 81 mg bid hold while on Eliquis

## 2023-05-15 NOTE — Assessment & Plan Note (Signed)
On Gluten free diet 

## 2023-05-15 NOTE — Progress Notes (Signed)
Subjective:  Patient ID: Scott Oneal, male    DOB: 1940/12/28  Age: 82 y.o. MRN: [id number]  CC: Follow-up (1 WEEK F/U)   HPI Scott Oneal presents for LLE pain and swelling - a little better F/u LBP - not better, but controlled better w/Oxycodone ( taking one bid) and Prednisone F/u GERD, DVT  Outpatient Medications Prior to Visit  Medication Sig Dispense Refill   aspirin EC 81 MG tablet Take 1 tablet (81 mg total) by mouth 2 (two) times daily. 100 tablet 3   atorvastatin (LIPITOR) 40 MG tablet Take 1 tablet (40 mg total) by mouth daily. 90 tablet 3   calcitRIOL (ROCALTROL) 0.25 MCG capsule TAKE 1 CAPSULE BY MOUTH EVERY DAY 90 capsule 1   carvedilol (COREG) 25 MG tablet TAKE 1 TABLET BY MOUTH TWICE A DAY WITH MEALS 180 tablet 1   cetirizine (ZYRTEC) 10 MG tablet Take 10 mg by mouth daily as needed for allergies.     Cholecalciferol 2000 UNITS TABS Take 2,000 Units by mouth daily.     clonazePAM (KLONOPIN) 0.5 MG tablet Take 1 tablet (0.5 mg total) by mouth 2 (two) times daily as needed for anxiety. 60 tablet 3   diclofenac sodium (VOLTAREN) 1 % GEL APPLY 4 G TOPICALLY 4 TIMES DAILY. (Patient taking differently: Apply 4 g topically 4 (four) times daily as needed (pain).) 200 g 5   hydrALAZINE (APRESOLINE) 10 MG tablet Take 1-2 tablet 3 times a day as needed if blood pressure is >170 60 tablet 3   isosorbide mononitrate (IMDUR) 30 MG 24 hr tablet TAKE 1 TABLET BY MOUTH EVERY DAY 90 tablet 2   ketoconazole (NIZORAL) 2 % cream Apply 1 Application topically 2 (two) times daily. Foot rash 45 g 1   levothyroxine (SYNTHROID) 175 MCG tablet TAKE 1 TABLET BY MOUTH EVERY DAY 90 tablet 3   lipase/protease/amylase (CREON) 36000 UNITS CPEP capsule TAKE 2 CAPSULES BY MOUTH 3 TIMES A DAY WITH MEALS, MAY TAKE 1 CAPSULE WITH SNACKS AS NEEDED 300 capsule 5   Multiple Vitamins-Minerals (PRESERVISION AREDS 2) CAPS Take 1 capsule by mouth in the morning and at bedtime.     nitroGLYCERIN (NITROSTAT) 0.4  MG SL tablet Place 0.4 mg under the tongue every 5 (five) minutes as needed for chest pain.     pantoprazole (PROTONIX) 40 MG tablet TAKE 1 TABLET BY MOUTH TWICE A DAY 180 tablet 1   predniSONE (DELTASONE) 10 MG tablet Take 3 tablets (30 mg total) by mouth daily with breakfast. 90 tablet 3   Propylene Glycol (SYSTANE BALANCE) 0.6 % SOLN Place 1 drop into both eyes daily as needed (Dry eye).     SYRINGE-NEEDLE, DISP, 3 ML (BD ECLIPSE SYRINGE) 23G X 1" 3 ML MISC As directed IM 50 each 2   tamsulosin (FLOMAX) 0.4 MG CAPS capsule Take 0.4 mg by mouth daily.     testosterone cypionate (DEPOTESTOSTERONE CYPIONATE) 200 MG/ML injection INJECT INTO MUSCLE EVERY 2 WEEKS 12 mL 1   acetaminophen (TYLENOL) 500 MG tablet Take 1 tablet (500 mg total) by mouth every 6 (six) hours as needed for up to 30 doses. 30 tablet 0   oxyCODONE-acetaminophen (PERCOCET) 10-325 MG tablet Take 0.5-1 tablets by mouth every 6 (six) hours as needed. 20 tablet 0   traMADol (ULTRAM) 50 MG tablet Take 1-2 tablets (50-100 mg total) by mouth every 6 (six) hours as needed for severe pain. 240 tablet 1   No facility-administered medications prior to visit.  ROS: Review of Systems  Constitutional:  Positive for fatigue. Negative for appetite change and unexpected weight change.  HENT:  Negative for congestion, nosebleeds, sneezing, sore throat and trouble swallowing.   Eyes:  Negative for itching and visual disturbance.  Respiratory:  Negative for cough.   Cardiovascular:  Positive for leg swelling. Negative for chest pain and palpitations.  Gastrointestinal:  Negative for abdominal distention, blood in stool, diarrhea and nausea.  Genitourinary:  Negative for frequency and hematuria.  Musculoskeletal:  Positive for arthralgias, back pain and gait problem. Negative for joint swelling and neck pain.  Skin:  Positive for color change. Negative for rash.  Neurological:  Negative for dizziness, tremors, speech difficulty and  weakness.  Psychiatric/Behavioral:  Negative for agitation, dysphoric mood and sleep disturbance. The patient is not nervous/anxious.     Objective:  BP 118/60 (BP Location: Right Arm, Patient Position: Sitting, Cuff Size: Large)   Pulse 76   Temp 98.3 F (36.8 C) (Oral)   Ht 6' (1.829 m)   Wt 194 lb (88 kg)   SpO2 97%   BMI 26.31 kg/m   BP Readings from Last 3 Encounters:  05/15/23 118/60  05/08/23 138/80  04/06/23 110/60    Wt Readings from Last 3 Encounters:  05/15/23 194 lb (88 kg)  05/08/23 193 lb (87.5 kg)  04/06/23 191 lb (86.6 kg)    Physical Exam Constitutional:      General: He is not in acute distress.    Appearance: Normal appearance. He is well-developed.     Comments: NAD  Eyes:     Conjunctiva/sclera: Conjunctivae normal.     Pupils: Pupils are equal, round, and reactive to light.  Neck:     Thyroid: No thyromegaly.     Vascular: No JVD.  Cardiovascular:     Rate and Rhythm: Normal rate and regular rhythm.     Heart sounds: Normal heart sounds. No murmur heard.    No friction rub. No gallop.  Pulmonary:     Effort: Pulmonary effort is normal. No respiratory distress.     Breath sounds: Normal breath sounds. No wheezing or rales.  Chest:     Chest wall: No tenderness.  Abdominal:     General: Bowel sounds are normal. There is no distension.     Palpations: Abdomen is soft. There is no mass.     Tenderness: There is no abdominal tenderness. There is no guarding or rebound.  Musculoskeletal:        General: Tenderness present. Normal range of motion.     Cervical back: Normal range of motion.     Right lower leg: Edema present.     Left lower leg: Edema present.  Lymphadenopathy:     Cervical: No cervical adenopathy.  Skin:    General: Skin is warm and dry.     Findings: Bruising present. No rash.  Neurological:     Mental Status: He is alert and oriented to person, place, and time.     Cranial Nerves: No cranial nerve deficit.     Motor: No  abnormal muscle tone.     Coordination: Coordination normal.     Gait: Gait normal.     Deep Tendon Reflexes: Reflexes are normal and symmetric.  Psychiatric:        Behavior: Behavior normal.        Thought Content: Thought content normal.        Judgment: Judgment normal.   Using a pole Bruised Edema LLE>RLE Distal  L thigh is tender  A complex case  Lab Results  Component Value Date   WBC 6.7 07/05/2022   HGB 13.5 07/05/2022   HCT 41.6 07/05/2022   PLT 87.0 (L) 07/05/2022   GLUCOSE 86 04/06/2023   CHOL 143 06/25/2022   TRIG 88 06/25/2022   HDL 42 06/25/2022   LDLCALC 83 06/25/2022   ALT 31 04/06/2023   AST 32 04/06/2023   NA 140 04/06/2023   K 5.1 04/06/2023   CL 102 04/06/2023   CREATININE 2.15 (H) 04/06/2023   BUN 31 (H) 04/06/2023   CO2 33 (H) 04/06/2023   TSH 2.89 04/06/2023   PSA 2.58 07/05/2022   INR 1.3 (H) 06/23/2022   HGBA1C 6.4 04/06/2023   MICROALBUR 1.2 05/18/2017    VAS [place] LOWER EXTREMITY VENOUS (DVT)  Result Date: 05/09/2023  Lower Venous DVT Study Patient Name:  RAZA BAYLESS  Date of Exam:   05/09/2023 Medical Rec #: [id number]      Accession #:    [id number] Date of Birth: 24-May-1941     Patient Gender: M Patient Age:   6 years Exam Location:  [facility] Procedure:      VAS [place] LOWER EXTREMITY VENOUS (DVT) Referring Phys: Macarthur Critchley Aquil Duhe --------------------------------------------------------------------------------  Indications: Pain, Swelling, and Edema.  Risk Factors: DVT Hx Chronic DVT Left CFV-PTV/Peroneal veins 06/24/22 Left mechanical thrombectomy Left common iliac vein, external iliac vein, common femoral vein, and left femoral vein. Anticoagulation: Eliquis. Limitations: Edema. Comparison Study: 07/26/22 Left LE DVT - continued DVT CFV-PTV/Peroneal V Performing Technologist: Lowell Guitar RVT, RDMS  Examination Guidelines: A complete evaluation includes B-mode imaging, spectral Doppler, color Doppler, and power Doppler as needed of  all accessible portions of each vessel. Bilateral testing is considered an integral part of a complete examination. Limited examinations for reoccurring indications may be performed as noted. The reflux portion of the exam is performed with the patient in reverse Trendelenburg.  +-----+---------------+---------+-----------+----------+--------------+ RIGHTCompressibilityPhasicitySpontaneityPropertiesThrombus Aging +-----+---------------+---------+-----------+----------+--------------+ CFV  Full           Yes      Yes                                 +-----+---------------+---------+-----------+----------+--------------+   +---------+---------------+---------+-----------+----------+--------------+ LEFT     CompressibilityPhasicitySpontaneityPropertiesThrombus Aging +---------+---------------+---------+-----------+----------+--------------+ CFV      Full           Yes      Yes                                 +---------+---------------+---------+-----------+----------+--------------+ SFJ      Full           Yes      Yes                                 +---------+---------------+---------+-----------+----------+--------------+ FV Prox  Partial        Yes      Yes                  Chronic        +---------+---------------+---------+-----------+----------+--------------+ FV Mid   Partial        Yes      No                   Chronic        +---------+---------------+---------+-----------+----------+--------------+  FV DistalPartial        Yes      No                   Chronic        +---------+---------------+---------+-----------+----------+--------------+ PFV      Full           Yes      Yes                                 +---------+---------------+---------+-----------+----------+--------------+ POP      Partial        Yes      Yes                  Chronic        +---------+---------------+---------+-----------+----------+--------------+ PTV       Partial        Yes                           Chronic        +---------+---------------+---------+-----------+----------+--------------+ PERO     Partial        Yes                           Chronic        +---------+---------------+---------+-----------+----------+--------------+     Summary: RIGHT: - No evidence of common femoral vein obstruction.  LEFT: - There is recanalized thrombus in the left femoral, popliteal, posterior tibial and peroneal vein(s).  - Findings consistent with chronic deep vein thrombosis involving the left femoral vein, left popliteal vein, left posterior tibial veins, and left peroneal veins.  *See table(s) above for measurements and observations. Electronically signed by Sherald Hess MD on 05/09/2023 at 4:52:17 PM.    Final     Assessment & Plan:   Problem List Items Addressed This Visit     GERD (gastroesophageal reflux disease)    Cont w/Protonix BID      Celiac disease    On Gluten free diet      Low back pain - Primary    LBP - not better, but controlled better w/Oxycodone ( taking one bid) and Prednisone 20 mg/d [name] is seeing Dr. Shon Baton.  Dr. Shon Baton referred him to see Dr. Yevette Edwards for the second opinion this week.      Relevant Medications   oxyCODONE-acetaminophen (PERCOCET) 10-325 MG tablet   CKD (chronic kidney disease) stage 3, GFR 30-59 ml/min (HCC)    Monitor GFR      PMR (polymyalgia rheumatica) (HCC)     PMR. Steroids (Deltasone 20 mg/d)        DVT (deep venous thrombosis) (HCC)    Reduce Eliquis to 2.5 mg bid in 1 mo due to bruising EC ASA 81 mg bid hold while on Eliquis      Relevant Medications   apixaban (ELIQUIS) 2.5 MG TABS tablet   Anticoagulant long-term use    On p.o. Eliquis       Postphlebitic syndrome    Post-phlebitic syndrome LLE Back on Eliquis Vascular Surgery f/u is pending      Relevant Medications   apixaban (ELIQUIS) 2.5 MG TABS tablet   Familial tremor    Stable      Vasomotor  rhinitis    [employer] [place] Atrovent nasal         Meds ordered this  encounter  Medications   oxyCODONE-acetaminophen (PERCOCET) 10-325 MG tablet    Sig: Take 0.5-1 tablets by mouth every 6 (six) hours as needed.    Dispense:  60 tablet    Refill:  0   apixaban (ELIQUIS) 2.5 MG TABS tablet    Sig: Take 1 tablet (2.5 mg total) by mouth 2 (two) times daily.    Dispense:  60 tablet    Refill:  5   ipratropium (ATROVENT) 0.03 % nasal spray    Sig: Place 2 sprays into both nostrils 3 (three) times daily. Use before meals    Dispense:  30 mL    Refill:  3      Follow-up: Return in about 4 weeks (around 06/12/2023) for a follow-up visit.  Sonda Primes, MD

## 2023-05-15 NOTE — Assessment & Plan Note (Signed)
On p.o. Eliquis

## 2023-05-15 NOTE — Assessment & Plan Note (Signed)
Stable

## 2023-05-15 NOTE — Patient Instructions (Signed)
Reduce Eliquis to 2.5 mg bid in 1 mo due to bruising  EC ASA 81 mg - put on hold while on Eliquis

## 2023-05-15 NOTE — Assessment & Plan Note (Addendum)
Post-phlebitic syndrome LLE Back on Eliquis Vascular Surgery f/u is pending

## 2023-05-16 ENCOUNTER — Ambulatory Visit: Payer: Self-pay | Admitting: Licensed Clinical Social Worker

## 2023-05-16 NOTE — Patient Instructions (Signed)
Visit Information  Thank you for taking time to visit with me today. Please don't hesitate to contact me if I can be of assistance to you.   Following are the goals we discussed today:   Goals Addressed             This Visit's Progress    Patient Stated he sometimes uses a cane to help him walk. He is at risk for falls.       Interventions:  Spoke via phone with client about client needs Discussed program support with RN, LCSW, Pharmacist Discussed walking of client. Client said he sometimes will use a cane to help him walk Discussed medication procurement Discussed support of client with PCP Encouraged client to talk with PCP about program support Discussed pain issues of client Client said he had PCP visit yesterday Client wrote down LCSW name and phone number . LCSW encouraged client to call LCSW as needed for further information on program support.         Client has LCSW name and phone number and can call LCSW as needed to learn more about program support   Please call the care guide team at [phone] if you need to cancel or reschedule your appointment.   If you are experiencing a Mental Health or Behavioral Health Crisis or need someone to talk to, please go to [facility] [address] [place] [phone])   The patient verbalized understanding of instructions, educational materials, and care plan provided today and DECLINED offer to receive copy of patient instructions, educational materials, and care plan.   The patient has been provided with contact information for the care management team and has been advised to call with any health related questions or concerns.   Kelton Pillar.Markian Glockner MSW, LCSW Licensed Visual merchandiser [facility] Care Management [phone]

## 2023-05-16 NOTE — Patient Outreach (Signed)
  Care Coordination   Initial Visit Note   05/16/2023 Name: Scott Oneal MRN: [id number] DOB: Feb 28, 1941  Scott Oneal is a 82 y.o. year old male who sees Plotnikov, Georgina Quint, MD for primary care. I spoke with  Scott Oneal by phone today.  What matters to the patients health and wellness today? Patient sometimes uses a cane to help him walk. He is at risk for falls.     Goals Addressed             This Visit's Progress    Patient Stated he sometimes uses a cane to help him walk. He is at risk for falls.       Interventions:  Spoke via phone with client about client needs Discussed program support with RN, LCSW, Pharmacist Discussed walking of client. Client said he sometimes will use a cane to help him walk Discussed medication procurement Discussed support of client with PCP Encouraged client to talk with PCP about program support Discussed pain issues of client Client said he had PCP visit yesterday Client wrote down LCSW name and phone number . LCSW encouraged client to call LCSW as needed for further information on program support.         SDOH assessments and interventions completed:  Yes  SDOH Interventions Today    Flowsheet Row Most Recent Value  SDOH Interventions   Physical Activity Interventions Other (Comments)  [ocassionally uses a cane to help him walk]  Stress Interventions Provide Counseling  [has stress in managing medical needs]        Care Coordination Interventions:  Yes, provided   Interventions Today    Flowsheet Row Most Recent Value  Chronic Disease   Chronic disease during today's visit Other  [spoke with client about client needs]  General Interventions   General Interventions Discussed/Reviewed General Interventions Discussed, [employer] program support with RN, LCSW, Pharmacist]  Exercise Interventions   Exercise Discussed/Reviewed Physical Activity  [uses a cane occasionally to help him walk]  Physical  Activity Discussed/Reviewed Physical Activity Discussed  Education Interventions   Education Provided Provided Education  Provided Verbal Education On Community Resources  Mental Health Interventions   Mental Health Discussed/Reviewed Anxiety  Scott Oneal is taking medications as prescribed. He did not discuss any mood issues]  Pharmacy Interventions   Pharmacy Dicussed/Reviewed Pharmacy Topics Discussed  Safety Interventions   Safety Discussed/Reviewed Fall Risk       Follow up plan: Client has LCSW name and phone number and can call LCSW as needed for further information about program support   Encounter Outcome:  Pt. Visit Completed   Scott Oneal.Scott Oneal MSW, LCSW Licensed Visual merchandiser [facility] Care Management [phone]

## 2023-05-19 DIAGNOSIS — M545 Low back pain, unspecified: Secondary | ICD-10-CM | POA: Diagnosis not present

## 2023-05-24 DIAGNOSIS — M5459 Other low back pain: Secondary | ICD-10-CM | POA: Diagnosis not present

## 2023-05-29 DIAGNOSIS — I129 Hypertensive chronic kidney disease with stage 1 through stage 4 chronic kidney disease, or unspecified chronic kidney disease: Secondary | ICD-10-CM | POA: Diagnosis not present

## 2023-05-29 DIAGNOSIS — R319 Hematuria, unspecified: Secondary | ICD-10-CM | POA: Diagnosis not present

## 2023-05-29 DIAGNOSIS — N183 Chronic kidney disease, stage 3 unspecified: Secondary | ICD-10-CM | POA: Diagnosis not present

## 2023-05-29 DIAGNOSIS — Z6841 Body Mass Index (BMI) 40.0 and over, adult: Secondary | ICD-10-CM | POA: Diagnosis not present

## 2023-05-29 DIAGNOSIS — N320 Bladder-neck obstruction: Secondary | ICD-10-CM | POA: Diagnosis not present

## 2023-05-30 DIAGNOSIS — M48062 Spinal stenosis, lumbar region with neurogenic claudication: Secondary | ICD-10-CM | POA: Diagnosis not present

## 2023-05-30 DIAGNOSIS — M5136 Other intervertebral disc degeneration, lumbar region: Secondary | ICD-10-CM | POA: Diagnosis not present

## 2023-05-30 DIAGNOSIS — M47896 Other spondylosis, lumbar region: Secondary | ICD-10-CM | POA: Diagnosis not present

## 2023-05-30 DIAGNOSIS — M47816 Spondylosis without myelopathy or radiculopathy, lumbar region: Secondary | ICD-10-CM | POA: Insufficient documentation

## 2023-05-31 ENCOUNTER — Encounter: Payer: Self-pay | Admitting: Cardiovascular Disease

## 2023-05-31 ENCOUNTER — Encounter: Payer: Self-pay | Admitting: Internal Medicine

## 2023-05-31 ENCOUNTER — Encounter: Payer: Self-pay | Admitting: Vascular Surgery

## 2023-05-31 ENCOUNTER — Encounter (INDEPENDENT_AMBULATORY_CARE_PROVIDER_SITE_OTHER): Payer: Self-pay

## 2023-05-31 NOTE — Telephone Encounter (Signed)
Add Lasix 40 mg to med list../lmb

## 2023-06-01 ENCOUNTER — Ambulatory Visit: Payer: PPO | Admitting: Vascular Surgery

## 2023-06-01 ENCOUNTER — Other Ambulatory Visit: Payer: Self-pay | Admitting: *Deleted

## 2023-06-01 ENCOUNTER — Encounter: Payer: Self-pay | Admitting: Vascular Surgery

## 2023-06-01 ENCOUNTER — Ambulatory Visit (HOSPITAL_COMMUNITY)
Admission: RE | Admit: 2023-06-01 | Discharge: 2023-06-01 | Disposition: A | Discharge status: Home / Self Care | Payer: PPO | Source: Ambulatory Visit | Attending: Vascular Surgery | Admitting: Vascular Surgery

## 2023-06-01 ENCOUNTER — Ambulatory Visit (INDEPENDENT_AMBULATORY_CARE_PROVIDER_SITE_OTHER)
Admission: RE | Admit: 2023-06-01 | Discharge: 2023-06-01 | Disposition: A | Discharge status: Home / Self Care | Payer: PPO | Source: Ambulatory Visit | Attending: Vascular Surgery | Admitting: Vascular Surgery

## 2023-06-01 VITALS — BP 135/81 | HR 76 | Temp 98.2°F | Resp 20 | Ht 72.0 in | Wt 189.0 lb

## 2023-06-01 DIAGNOSIS — I959 Hypotension, unspecified: Secondary | ICD-10-CM

## 2023-06-01 DIAGNOSIS — L539 Erythematous condition, unspecified: Secondary | ICD-10-CM | POA: Diagnosis not present

## 2023-06-01 DIAGNOSIS — I82512 Chronic embolism and thrombosis of left femoral vein: Secondary | ICD-10-CM

## 2023-06-01 DIAGNOSIS — M79672 Pain in left foot: Secondary | ICD-10-CM

## 2023-06-01 DIAGNOSIS — I82422 Acute embolism and thrombosis of left iliac vein: Secondary | ICD-10-CM

## 2023-06-01 DIAGNOSIS — L819 Disorder of pigmentation, unspecified: Secondary | ICD-10-CM | POA: Diagnosis not present

## 2023-06-01 DIAGNOSIS — I872 Venous insufficiency (chronic) (peripheral): Secondary | ICD-10-CM

## 2023-06-01 NOTE — Progress Notes (Signed)
REASON FOR VISIT:   Follow-up of extensive left lower extremity DVT.  MEDICAL ISSUES:   H/O DVT: This patient underwent mechanical thrombectomy for an extensive iliofemoral DVT in August 2023.  He does have some persistent clot in the femoral popliteal and posterior tibial veins but has significant recanalization.  Given that he is having significant swelling in the legs I think it would be reasonable to continue his Eliquis for now.  There is also risk with continuing it indefinitely however.  Therefore I think a good compromise would be a follow-up venous duplex scan in 6 months.  I explained that I will be retiring so he will be seen on the PA schedule at that time.  If his swelling has improved and his venous duplex scan at that time is stable I think it would be his reasonable to stop his Eliquis at that time.  SYNCOPAL EPISODE: This patient has what sounds like a syncopal episode yesterday.  His blood pressure has been a little bit low it sounds like.  He did not have any focal neurologic symptoms and his carotid duplex scan today was unremarkable.  HPI:   Scott Oneal is a pleasant 82 y.o. male who I saw last on 11/03/2022 with left leg pain.  He fell on August 2023 and developed a DVT in the left leg.  He underwent mechanical thrombectomy of an iliofemoral DVT of the left lower extremity by Dr. Lenell Antu on 06/24/2022.  Subsequent duplex scan on 07/26/2022 showed resolution of his iliofemoral DVT.  He did have some persistent clot in the left femoral vein down to the calf and he had been maintained on Eliquis.  He was having some pain in the left leg and I thought this was multifactorial.  Some of it could be attributed to venous hypertension.  We discussed conservative measures.  I felt that he should continue the Eliquis for an entire year.  I felt that we could get a follow-up duplex in August of this year make a decision about his Eliquis.  Of note he was having some leg swelling which  prompted a left lower extremity venous duplex scan on 05/08/2022 which showed some chronic thrombus in the femoral, popliteal and posterior tibial veins.  These veins have recanalized significantly.  The patient's family called yesterday as they witnessed an episode where he was briefly not responding.  This lasted for less than a minute.  In questioning the patient he does not remember any focal weakness or paresthesias.  He had no expressive or receptive aphasia or amaurosis fugax.  The family wished that we consider carotid studies.  He does continue to have significant swelling in both legs but more significantly on the left side.  He had no previous history of stroke or TIAs.  Past Medical History:  Diagnosis Date   Allergy    Arthritis    Calculus of gallbladder without mention of cholecystitis    Cataract    bilateral cateracts removed   Celiac disease    Chronic kidney disease    stage 3   Coronary atherosclerosis of unspecified type of vessel, native or graft    Dermatitis 06/08/2021   Erythema of lower extremity 09/08/2021   Esophageal reflux    Glaucoma    History of cellulitis 09/08/2021   History of kidney stones    Loss of weight    Lower extremity weakness 05/31/2021   Macular degeneration    Rt eye . injections   Old  myocardial infarction    Osteoporosis, unspecified    Other malaise and fatigue    Other specified cardiac dysrhythmias(427.89)    Other testicular hypofunction    Pancreatic insufficiency 05/31/2021   Personal history of colonic polyps    Postural dizziness with presyncope 12/13/2021   Pure hypercholesterolemia    Thrombocytopenia (HCC)    chronic, per PCP notes dating back to 2009   Tinea pedis 05/31/2021   Unspecified asthma(493.90)    Unspecified essential hypertension    Unspecified hypothyroidism    Venous stasis dermatitis 09/08/2021    Family History  Problem Relation Age of Onset   Lymphoma Father    Hypertension Other    Vision  loss Maternal Grandmother    Colon cancer Son 48   Asthma Neg Hx    Esophageal cancer Neg Hx    Rectal cancer Neg Hx    Stomach cancer Neg Hx    Colon polyps Neg Hx     SOCIAL HISTORY: Social History   Tobacco Use   Smoking status: Never    Passive exposure: Never   Smokeless tobacco: Never  Substance Use Topics   Alcohol use: No    Allergies  Allergen Reactions   Biaxin [Clarithromycin]     Dizziness   Tizanidine     Felt funny   Allantoin-Pramoxine Other (See Comments)    Pt does't remember reaction   Amlodipine Swelling    Edema    Amoxicillin Rash    Rash on arms that developed towards end of 7 day treatment   Benzalkonium Chloride Itching and Rash   Doxycycline Photosensitivity and Rash   Metoprolol Tartrate Other (See Comments)    REACTION: fatigue    Current Outpatient Medications  Medication Sig Dispense Refill   aspirin EC 81 MG tablet Take 1 tablet (81 mg total) by mouth 2 (two) times daily. 100 tablet 3   atorvastatin (LIPITOR) 40 MG tablet Take 1 tablet (40 mg total) by mouth daily. 90 tablet 3   calcitRIOL (ROCALTROL) 0.25 MCG capsule TAKE 1 CAPSULE BY MOUTH EVERY DAY 90 capsule 1   carvedilol (COREG) 25 MG tablet TAKE 1 TABLET BY MOUTH TWICE A DAY WITH MEALS 180 tablet 1   cetirizine (ZYRTEC) 10 MG tablet Take 10 mg by mouth daily as needed for allergies.     Cholecalciferol 2000 UNITS TABS Take 2,000 Units by mouth daily.     clonazePAM (KLONOPIN) 0.5 MG tablet Take 1 tablet (0.5 mg total) by mouth 2 (two) times daily as needed for anxiety. 60 tablet 3   diclofenac sodium (VOLTAREN) 1 % GEL APPLY 4 G TOPICALLY 4 TIMES DAILY. (Patient taking differently: Apply 4 g topically 4 (four) times daily as needed (pain).) 200 g 5   ELIQUIS 5 MG TABS tablet Take 5 mg by mouth 2 (two) times daily.     furosemide (LASIX) 40 MG tablet Take 40 mg by mouth daily.     hydrALAZINE (APRESOLINE) 10 MG tablet Take 1-2 tablet 3 times a day as needed if blood pressure is  >170 60 tablet 3   ipratropium (ATROVENT) 0.03 % nasal spray Place 2 sprays into both nostrils 3 (three) times daily. Use before meals 30 mL 3   isosorbide mononitrate (IMDUR) 30 MG 24 hr tablet TAKE 1 TABLET BY MOUTH EVERY DAY 90 tablet 2   ketoconazole (NIZORAL) 2 % cream Apply 1 Application topically 2 (two) times daily. Foot rash 45 g 1   levothyroxine (SYNTHROID) 175 MCG tablet TAKE  1 TABLET BY MOUTH EVERY DAY 90 tablet 3   lipase/protease/amylase (CREON) 36000 UNITS CPEP capsule TAKE 2 CAPSULES BY MOUTH 3 TIMES A DAY WITH MEALS, MAY TAKE 1 CAPSULE WITH SNACKS AS NEEDED 300 capsule 5   Multiple Vitamins-Minerals (PRESERVISION AREDS 2) CAPS Take 1 capsule by mouth in the morning and at bedtime.     nitroGLYCERIN (NITROSTAT) 0.4 MG SL tablet Place 0.4 mg under the tongue every 5 (five) minutes as needed for chest pain.     oxyCODONE-acetaminophen (PERCOCET) 10-325 MG tablet Take 0.5-1 tablets by mouth every 6 (six) hours as needed. 60 tablet 0   pantoprazole (PROTONIX) 40 MG tablet TAKE 1 TABLET BY MOUTH TWICE A DAY 180 tablet 1   predniSONE (DELTASONE) 10 MG tablet Take 3 tablets (30 mg total) by mouth daily with breakfast. 90 tablet 3   Propylene Glycol (SYSTANE BALANCE) 0.6 % SOLN Place 1 drop into both eyes daily as needed (Dry eye).     SYRINGE-NEEDLE, DISP, 3 ML (BD ECLIPSE SYRINGE) 23G X 1" 3 ML MISC As directed IM 50 each 2   tamsulosin (FLOMAX) 0.4 MG CAPS capsule Take 0.4 mg by mouth daily.     testosterone cypionate (DEPOTESTOSTERONE CYPIONATE) 200 MG/ML injection INJECT INTO MUSCLE EVERY 2 WEEKS 12 mL 1   No current facility-administered medications for this visit.    REVIEW OF SYSTEMS:  [X]  denotes positive finding, [ ]  denotes negative finding Cardiac  Comments:  Chest pain or chest pressure:    Shortness of breath upon exertion: x   Short of breath when lying flat:    Irregular heart rhythm:        Vascular    Pain in calf, thigh, or hip brought on by ambulation: x    Pain in feet at night that wakes you up from your sleep:     Blood clot in your veins:    Leg swelling:  x       Pulmonary    Oxygen at home:    Productive cough:  x   Wheezing:         Neurologic    Sudden weakness in arms or legs:     Sudden numbness in arms or legs:     Sudden onset of difficulty speaking or slurred speech:    Temporary loss of vision in one eye:     Problems with dizziness:  x       Gastrointestinal    Blood in stool:     Vomited blood:         Genitourinary    Burning when urinating:     Blood in urine:        Psychiatric    Major depression:         Hematologic    Bleeding problems: x   Problems with blood clotting too easily:        Skin    Rashes or ulcers:        Constitutional    Fever or chills:     PHYSICAL EXAM:   Vitals:   06/01/23 0846  BP: 135/81  Pulse: 76  Resp: 20  Temp: 98.2 F (36.8 C)  SpO2: 95%  Weight: 189 lb (85.7 kg)  Height: 6' (1.829 m)    GENERAL: The patient is a well-nourished male, in no acute distress. The vital signs are documented above. CARDIAC: There is a regular rate and rhythm.  VASCULAR: I do not detect carotid bruits. He has  palpable femoral pulses.  Both feet are warm and well-perfused. He has bilateral lower extremity swelling. PULMONARY: There is good air exchange bilaterally without wheezing or rales. ABDOMEN: Soft and non-tender with normal pitched bowel sounds.  MUSCULOSKELETAL: There are no major deformities or cyanosis. NEUROLOGIC: No focal weakness or paresthesias are detected. SKIN: There are no ulcers or rashes noted. PSYCHIATRIC: The patient has a normal affect.  DATA:    DUPLEX IVC ILIAC VEINS: I have independently interpreted the study.  Duplex of his IVC and iliac vein shows no evidence of clot in the IVC or iliac veins.  LEFT LOWER EXTREMITY VENOUS DUPLEX: This patient did have a left lower extremity venous duplex scan on 05/09/2023.  This showed recanalization of the thrombus  in the left femoral, popliteal, posterior tibial, and peroneal veins.  There was some chronic residual thrombus in these veins.  CAROTID DUPLEX: I have independently interpreted his carotid duplex scan today.  On the right side, there is a less than 39% stenosis.  The right vertebral artery is patent with antegrade flow.  On the left side, there is a less than 39% stenosis.  The left vertebral artery is patent with antegrade flow.  Waverly [facility] [phone]

## 2023-06-02 ENCOUNTER — Other Ambulatory Visit: Payer: Self-pay

## 2023-06-02 DIAGNOSIS — I82512 Chronic embolism and thrombosis of left femoral vein: Secondary | ICD-10-CM

## 2023-06-07 DIAGNOSIS — M47816 Spondylosis without myelopathy or radiculopathy, lumbar region: Secondary | ICD-10-CM | POA: Diagnosis not present

## 2023-06-08 ENCOUNTER — Ambulatory Visit: Payer: PPO | Admitting: Vascular Surgery

## 2023-06-09 ENCOUNTER — Other Ambulatory Visit: Payer: Self-pay | Admitting: Internal Medicine

## 2023-06-13 ENCOUNTER — Encounter: Payer: Self-pay | Admitting: Internal Medicine

## 2023-06-13 ENCOUNTER — Ambulatory Visit (INDEPENDENT_AMBULATORY_CARE_PROVIDER_SITE_OTHER): Payer: PPO | Admitting: Internal Medicine

## 2023-06-13 VITALS — BP 102/60 | HR 85 | Temp 97.9°F | Ht 72.0 in | Wt 188.0 lb

## 2023-06-13 DIAGNOSIS — R609 Edema, unspecified: Secondary | ICD-10-CM

## 2023-06-13 DIAGNOSIS — M5442 Lumbago with sciatica, left side: Secondary | ICD-10-CM | POA: Diagnosis not present

## 2023-06-13 DIAGNOSIS — K900 Celiac disease: Secondary | ICD-10-CM

## 2023-06-13 DIAGNOSIS — K922 Gastrointestinal hemorrhage, unspecified: Secondary | ICD-10-CM | POA: Diagnosis not present

## 2023-06-13 DIAGNOSIS — M79605 Pain in left leg: Secondary | ICD-10-CM

## 2023-06-13 DIAGNOSIS — M79672 Pain in left foot: Secondary | ICD-10-CM

## 2023-06-13 DIAGNOSIS — E034 Atrophy of thyroid (acquired): Secondary | ICD-10-CM

## 2023-06-13 DIAGNOSIS — G8929 Other chronic pain: Secondary | ICD-10-CM

## 2023-06-13 MED ORDER — OXYCODONE-ACETAMINOPHEN 10-325 MG PO TABS: 0.5000 | ORAL_TABLET | Freq: Four times a day (QID) | ORAL | 0 refills | Status: DC | PRN

## 2023-06-13 NOTE — Assessment & Plan Note (Signed)
He is not a surgical candidate for any back surgery per Dr. Shon Baton and Dr. Yevette Edwards He has had epidural injections by Dr. Drucie Ip Low back pain -  - severe 7-8/10 all the time. Oxycodone - it stopped working the other day and Scott Oneal stopped taking it.  The pain is worse.  He will take Percocet 10/325 up to 4 times a day.  Potential benefits of a long term opioids use as well as potential risks (i.e. addiction risk, apnea etc) and complications (i.e. Somnolence, constipation and others) were explained to the patient and were aknowledged.

## 2023-06-13 NOTE — Progress Notes (Signed)
Subjective:  Patient ID: JAMICAH SENNETT, male    DOB: November 23, 1940  Age: 82 y.o. MRN: [id number]  CC: Follow-up (4 week f/u)   HPI Alonte W Derouin presents for LBP - on epidural injections now: "they help for a day or two" C/o LLE swelling and pain - severe 7-8/10 all the time. Oxycodone - it stopped working the other day and [name] stopped taking it.  The pain is worse. C/o weakness, insomnia due to pain, lack of energy He went to see Dr. Cari Caraway and Dr. Annie Sable.  Dr. Kathrene Bongo started him on a diuretic.  He does not recall the name.  Possibly, it is  spironolactone.  He is not taking Lasix.  The swelling is better yet.  Outpatient Medications Prior to Visit  Medication Sig Dispense Refill   aspirin EC 81 MG tablet Take 1 tablet (81 mg total) by mouth 2 (two) times daily. 100 tablet 3   atorvastatin (LIPITOR) 40 MG tablet Take 1 tablet (40 mg total) by mouth daily. 90 tablet 3   calcitRIOL (ROCALTROL) 0.25 MCG capsule TAKE 1 CAPSULE BY MOUTH EVERY DAY 90 capsule 1   carvedilol (COREG) 25 MG tablet TAKE 1 TABLET BY MOUTH TWICE A DAY WITH MEALS 180 tablet 1   cetirizine (ZYRTEC) 10 MG tablet Take 10 mg by mouth daily as needed for allergies.     Cholecalciferol 2000 UNITS TABS Take 2,000 Units by mouth daily.     clonazePAM (KLONOPIN) 0.5 MG tablet Take 1 tablet (0.5 mg total) by mouth 2 (two) times daily as needed for anxiety. 60 tablet 3   diclofenac sodium (VOLTAREN) 1 % GEL APPLY 4 G TOPICALLY 4 TIMES DAILY. (Patient taking differently: Apply 4 g topically 4 (four) times daily as needed (pain).) 200 g 5   ELIQUIS 5 MG TABS tablet Take 5 mg by mouth 2 (two) times daily.     furosemide (LASIX) 40 MG tablet Take 40 mg by mouth daily.     hydrALAZINE (APRESOLINE) 10 MG tablet Take 1-2 tablet 3 times a day as needed if blood pressure is >170 60 tablet 3   ipratropium (ATROVENT) 0.03 % nasal spray Place 2 sprays into both nostrils 3 (three) times daily. Use before meals 30 mL  3   isosorbide mononitrate (IMDUR) 30 MG 24 hr tablet TAKE 1 TABLET BY MOUTH EVERY DAY 90 tablet 2   ketoconazole (NIZORAL) 2 % cream Apply 1 Application topically 2 (two) times daily. Foot rash 45 g 1   levothyroxine (SYNTHROID) 175 MCG tablet TAKE 1 TABLET BY MOUTH EVERY DAY 90 tablet 3   lipase/protease/amylase (CREON) 36000 UNITS CPEP capsule TAKE 2 CAPSULES BY MOUTH 3 TIMES A DAY WITH MEALS, MAY TAKE 1 CAPSULE WITH SNACKS AS NEEDED 300 capsule 5   Multiple Vitamins-Minerals (PRESERVISION AREDS 2) CAPS Take 1 capsule by mouth in the morning and at bedtime.     nitroGLYCERIN (NITROSTAT) 0.4 MG SL tablet Place 0.4 mg under the tongue every 5 (five) minutes as needed for chest pain.     pantoprazole (PROTONIX) 40 MG tablet TAKE 1 TABLET BY MOUTH TWICE A DAY 180 tablet 1   predniSONE (DELTASONE) 10 MG tablet Take 3 tablets (30 mg total) by mouth daily with breakfast. 90 tablet 3   Propylene Glycol (SYSTANE BALANCE) 0.6 % SOLN Place 1 drop into both eyes daily as needed (Dry eye).     SYRINGE-NEEDLE, DISP, 3 ML (BD ECLIPSE SYRINGE) 23G X 1" 3 ML MISC  As directed IM 50 each 2   tamsulosin (FLOMAX) 0.4 MG CAPS capsule Take 0.4 mg by mouth daily.     testosterone cypionate (DEPOTESTOSTERONE CYPIONATE) 200 MG/ML injection INJECT INTO MUSCLE EVERY 2 WEEKS 12 mL 1   oxyCODONE-acetaminophen (PERCOCET) 10-325 MG tablet Take 0.5-1 tablets by mouth every 6 (six) hours as needed. 60 tablet 0   No facility-administered medications prior to visit.    ROS: Review of Systems  Constitutional:  Positive for fatigue and unexpected weight change. Negative for appetite change.  HENT:  Negative for congestion, nosebleeds, sneezing, sore throat and trouble swallowing.   Eyes:  Negative for itching and visual disturbance.  Respiratory:  Negative for cough.   Cardiovascular:  Positive for leg swelling. Negative for chest pain and palpitations.  Gastrointestinal:  Negative for abdominal distention, blood in  stool, diarrhea and nausea.  Genitourinary:  Negative for frequency and hematuria.  Musculoskeletal:  Positive for arthralgias, back pain, gait problem and neck pain. Negative for joint swelling.  Skin:  Positive for color change. Negative for rash.  Neurological:  Positive for dizziness, weakness and numbness. Negative for tremors and speech difficulty.  Psychiatric/Behavioral:  Negative for agitation, dysphoric mood, sleep disturbance and suicidal ideas. The patient is not nervous/anxious.     Objective:  BP 102/60 (BP Location: Left Arm, Patient Position: Sitting, Cuff Size: Large)   Pulse 85   Temp 97.9 F (36.6 C) (Oral)   Ht 6' (1.829 m)   Wt 188 lb (85.3 kg)   SpO2 95%   BMI 25.50 kg/m   BP Readings from Last 3 Encounters:  06/13/23 102/60  06/01/23 135/81  05/15/23 118/60    Wt Readings from Last 3 Encounters:  06/13/23 188 lb (85.3 kg)  06/01/23 189 lb (85.7 kg)  05/15/23 194 lb (88 kg)    Physical Exam Constitutional:      General: He is not in acute distress.    Appearance: He is well-developed. He is obese.     Comments: NAD  Eyes:     Conjunctiva/sclera: Conjunctivae normal.     Pupils: Pupils are equal, round, and reactive to light.  Neck:     Thyroid: No thyromegaly.     Vascular: No JVD.  Cardiovascular:     Rate and Rhythm: Normal rate and regular rhythm.     Heart sounds: Normal heart sounds. No murmur heard.    No friction rub. No gallop.  Pulmonary:     Effort: Pulmonary effort is normal. No respiratory distress.     Breath sounds: Normal breath sounds. No wheezing or rales.  Chest:     Chest wall: No tenderness.  Abdominal:     General: Bowel sounds are normal. There is no distension.     Palpations: Abdomen is soft. There is no mass.     Tenderness: There is no abdominal tenderness. There is no guarding or rebound.  Musculoskeletal:        General: Swelling and tenderness present. Normal range of motion.     Cervical back: Normal range of  motion.     Right lower leg: Edema present.     Left lower leg: Edema present.  Lymphadenopathy:     Cervical: No cervical adenopathy.  Skin:    General: Skin is warm and dry.     Findings: No lesion or rash.  Neurological:     Mental Status: He is alert and oriented to person, place, and time.     Cranial Nerves: No  cranial nerve deficit.     Motor: No abnormal muscle tone.     Coordination: Coordination normal.     Gait: Gait normal.     Deep Tendon Reflexes: Reflexes are normal and symmetric.  Psychiatric:        Behavior: Behavior normal.        Thought Content: Thought content normal.        Judgment: Judgment normal.   Wayne appears tired LLE 3+ swelling up to mid-thigh RLE 1+  Lab Results  Component Value Date   WBC 6.7 07/05/2022   HGB 13.5 07/05/2022   HCT 41.6 07/05/2022   PLT 87.0 (L) 07/05/2022   GLUCOSE 86 04/06/2023   CHOL 143 06/25/2022   TRIG 88 06/25/2022   HDL 42 06/25/2022   LDLCALC 83 06/25/2022   ALT 31 04/06/2023   AST 32 04/06/2023   NA 140 04/06/2023   K 5.1 04/06/2023   CL 102 04/06/2023   CREATININE 2.15 (H) 04/06/2023   BUN 31 (H) 04/06/2023   CO2 33 (H) 04/06/2023   TSH 2.89 04/06/2023   PSA 2.58 07/05/2022   INR 1.3 (H) 06/23/2022   HGBA1C 6.4 04/06/2023   MICROALBUR 1.2 05/18/2017    VAS US CAROTID  Result Date: 06/01/2023 Carotid Arterial Duplex Study Patient Name:  NEEV MCBURNIE  Date of Exam:   06/01/2023 Medical Rec #: [id number]      Accession #:    [id number] Date of Birth: 05-Jan-1941     Patient Gender: M Patient Age:   25 years Exam Location:  [facility] Procedure:      VAS US CAROTID Referring Phys: Cristal Deer DICKSON --------------------------------------------------------------------------------  Indications: Syncope. Performing Technologist: Dorthula Matas RVS, RCS  Examination Guidelines: A complete evaluation includes B-mode imaging, spectral Doppler, color Doppler, and power Doppler as needed of all  accessible portions of each vessel. Bilateral testing is considered an integral part of a complete examination. Limited examinations for reoccurring indications may be performed as noted.  Right Carotid Findings: +----------+--------+--------+--------+------------------+--------+           PSV cm/sEDV cm/sStenosisPlaque DescriptionComments +----------+--------+--------+--------+------------------+--------+ CCA Prox  90      16                                         +----------+--------+--------+--------+------------------+--------+ CCA Mid   68      16                                         +----------+--------+--------+--------+------------------+--------+ CCA Distal73      13              heterogenous               +----------+--------+--------+--------+------------------+--------+ ICA Prox  74      12      1-39%   heterogenous               +----------+--------+--------+--------+------------------+--------+ ICA Mid   66      22                                         +----------+--------+--------+--------+------------------+--------+ ICA Distal117     35                                         +----------+--------+--------+--------+------------------+--------+  ECA       89      9                                          +----------+--------+--------+--------+------------------+--------+ +----------+--------+-------+----------------+-------------------+           PSV cm/sEDV cmsDescribe        Arm Pressure (mmHG) +----------+--------+-------+----------------+-------------------+ [id number]            Multiphasic, [id number]                 +----------+--------+-------+----------------+-------------------+ +---------+--------+--+--------+--+---------+ VertebralPSV cm/s58EDV cm/s17Antegrade +---------+--------+--+--------+--+---------+  Left Carotid Findings: +----------+--------+--------+--------+------------------+--------+           PSV  cm/sEDV cm/sStenosisPlaque DescriptionComments +----------+--------+--------+--------+------------------+--------+ CCA Prox  118     22                                         +----------+--------+--------+--------+------------------+--------+ CCA Mid   104     16              heterogenous               +----------+--------+--------+--------+------------------+--------+ CCA Distal99      18              calcific                   +----------+--------+--------+--------+------------------+--------+ ICA Prox  63      12      1-39%   calcific                   +----------+--------+--------+--------+------------------+--------+ ICA Mid   80      28                                         +----------+--------+--------+--------+------------------+--------+ ICA Distal66      24                                         +----------+--------+--------+--------+------------------+--------+ ECA       110     11                                         +----------+--------+--------+--------+------------------+--------+ +----------+--------+--------+----------------+-------------------+           PSV cm/sEDV cm/sDescribe        Arm Pressure (mmHG) +----------+--------+--------+----------------+-------------------+ [id number]             Multiphasic, [id number]                 +----------+--------+--------+----------------+-------------------+ +---------+--------+--+--------+--+---------+ VertebralPSV cm/s45EDV cm/s10Antegrade +---------+--------+--+--------+--+---------+   Summary: Right Carotid: Velocities in the right ICA are consistent with a 1-39% stenosis. Left Carotid: Velocities in the left ICA are consistent with a 1-39% stenosis. Vertebrals:  Bilateral vertebral arteries demonstrate antegrade flow. Subclavians: Normal flow hemodynamics were seen in bilateral subclavian              arteries. *See table(s) above for measurements and observations.   Electronically signed by Waverly Ferrari MD on 06/01/2023  at 9:37:45 AM.    Final    VAS [place] IVC/ILIAC (VENOUS ONLY)  Result Date: 06/01/2023 IVC/ILIAC STUDY Patient Name:  JELAN BOWSHER  Date of Exam:   06/01/2023 Medical Rec #: [id number]      Accession #:    [id number] Date of Birth: 1941/09/15     Patient Gender: M Patient Age:   20 years Exam Location:  [facility] Procedure:      VAS [place] IVC/ILIAC (VENOUS ONLY) Referring Phys: Cristal Deer DICKSON --------------------------------------------------------------------------------  Indications: IVC iliac evaluation Vascular Interventions: 06/24/22: mechanical thrombectomy of left common iliac                         vein, left external iliac vein, left common femoral                         vein, left femoral vein.  Performing Technologist: Dorthula Matas RVS, RCS  Examination Guidelines: A complete evaluation includes B-mode imaging, spectral Doppler, color Doppler, and power Doppler as needed of all accessible portions of each vessel. Bilateral testing is considered an integral part of a complete examination. Limited examinations for reoccurring indications may be performed as noted.  IVC/Iliac Findings: +----------+------+--------+--------+    IVC    PatentThrombusComments +----------+------+--------+--------+ IVC Prox  patent                 +----------+------+--------+--------+ IVC Mid   patent                 +----------+------+--------+--------+ IVC Distalpatent                 +----------+------+--------+--------+  +-------------------+---------+-----------+---------+-----------+--------+         CIV        RT-PatentRT-ThrombusLT-PatentLT-ThrombusComments +-------------------+---------+-----------+---------+-----------+--------+ Common Iliac Prox                       patent                      +-------------------+---------+-----------+---------+-----------+--------+ Common Iliac Distal                      patent                      +-------------------+---------+-----------+---------+-----------+--------+  +-------------------------+---------+-----------+---------+-----------+--------+            EIV           RT-PatentRT-ThrombusLT-PatentLT-ThrombusComments +-------------------------+---------+-----------+---------+-----------+--------+ External Iliac Vein Prox                      patent                      +-------------------------+---------+-----------+---------+-----------+--------+ External Iliac Vein Mid                       patent                      +-------------------------+---------+-----------+---------+-----------+--------+ External Iliac Vein                           patent                      Distal                                                                    +-------------------------+---------+-----------+---------+-----------+--------+  Summary: IVC/Iliac: No evidence of thrombus in IVC and Iliac veins.  *See table(s) above for measurements and observations.  Electronically signed by Waverly Ferrari MD on 06/01/2023 at 9:03:15 AM.    Final     Assessment & Plan:   Problem List Items Addressed This Visit     Hypothyroidism    Chronic Cont on Levothroid      Celiac disease    On Gluten free diet      Low back pain    He is not a surgical candidate for any back surgery per Dr. Shon Baton and Dr. Yevette Edwards He has had epidural injections by Dr. Drucie Ip Low back pain -  - severe 7-8/10 all the time. Oxycodone - it stopped working the other day and [name] stopped taking it.  The pain is worse.  He will take Percocet 10/325 up to 4 times a day.  Potential benefits of a long term opioids use as well as potential risks (i.e. addiction risk, apnea etc) and complications (i.e. Somnolence, constipation and others) were explained to the patient and were aknowledged.       Relevant Medications   oxyCODONE-acetaminophen (PERCOCET)  10-325 MG tablet   Left foot pain    Percocet prn Treat edema       Lower GI bleed    No relapse      Edema - Primary    Pt saw Dr Edilia Bo LLE>>RLE On Eliquis Look at sequential air compression boots for swelling Dr Kathrene Bongo is managing edema      Leg pain, central, left    He is not a surgical candidate for any back surgery per Dr. Shon Baton and Dr. Yevette Edwards He has had epidural injections by Dr. Drucie Ip Low back pain/leg pain-  - severe 7-8/10 all the time. Oxycodone - it stopped working the other day and [name] stopped taking it.  The pain is worse.  He will take Percocet 10/325 up to 4 times a day.  Potential benefits of a long term opioids use as well as potential risks (i.e. addiction risk, apnea etc) and complications (i.e. Somnolence, constipation and others) were explained to the patient and were aknowledged.         Meds ordered this encounter  Medications   oxyCODONE-acetaminophen (PERCOCET) 10-325 MG tablet    Sig: Take 0.5-1 tablets by mouth every 6 (six) hours as needed.    Dispense:  120 tablet    Refill:  0      Follow-up: Return in about 2 weeks (around 06/27/2023) for a follow-up visit.  Sonda Primes, MD

## 2023-06-13 NOTE — Assessment & Plan Note (Addendum)
Pt saw Dr Edilia Bo LLE>>RLE On Eliquis Look at sequential air compression boots for swelling Dr Kathrene Bongo is managing edema

## 2023-06-13 NOTE — Assessment & Plan Note (Addendum)
No relapse 

## 2023-06-13 NOTE — Assessment & Plan Note (Signed)
On Gluten free diet 

## 2023-06-13 NOTE — Assessment & Plan Note (Signed)
Percocet prn Treat edema

## 2023-06-13 NOTE — Assessment & Plan Note (Signed)
He is not a surgical candidate for any back surgery per Dr. Shon Baton and Dr. Yevette Edwards He has had epidural injections by Dr. Drucie Ip Low back pain/leg pain-  - severe 7-8/10 all the time. Oxycodone - it stopped working the other day and [name] stopped taking it.  The pain is worse.  He will take Percocet 10/325 up to 4 times a day.  Potential benefits of a long term opioids use as well as potential risks (i.e. addiction risk, apnea etc) and complications (i.e. Somnolence, constipation and others) were explained to the patient and were aknowledged.

## 2023-06-13 NOTE — Assessment & Plan Note (Signed)
Chronic Cont on Levothroid

## 2023-06-13 NOTE — Patient Instructions (Signed)
Look at sequential air compression boots for swelling

## 2023-06-19 DIAGNOSIS — M47816 Spondylosis without myelopathy or radiculopathy, lumbar region: Secondary | ICD-10-CM | POA: Diagnosis not present

## 2023-06-19 DIAGNOSIS — M5136 Other intervertebral disc degeneration, lumbar region: Secondary | ICD-10-CM | POA: Diagnosis not present

## 2023-06-19 DIAGNOSIS — M48062 Spinal stenosis, lumbar region with neurogenic claudication: Secondary | ICD-10-CM | POA: Diagnosis not present

## 2023-06-27 ENCOUNTER — Encounter: Payer: Self-pay | Admitting: Internal Medicine

## 2023-06-27 ENCOUNTER — Ambulatory Visit (INDEPENDENT_AMBULATORY_CARE_PROVIDER_SITE_OTHER): Payer: PPO | Admitting: Internal Medicine

## 2023-06-27 VITALS — BP 82/60 | HR 136 | Temp 98.3°F | Ht 72.0 in | Wt 188.0 lb

## 2023-06-27 DIAGNOSIS — E034 Atrophy of thyroid (acquired): Secondary | ICD-10-CM | POA: Diagnosis not present

## 2023-06-27 DIAGNOSIS — R000 Tachycardia, unspecified: Secondary | ICD-10-CM

## 2023-06-27 DIAGNOSIS — G8929 Other chronic pain: Secondary | ICD-10-CM

## 2023-06-27 DIAGNOSIS — I10 Essential (primary) hypertension: Secondary | ICD-10-CM | POA: Diagnosis not present

## 2023-06-27 DIAGNOSIS — M5442 Lumbago with sciatica, left side: Secondary | ICD-10-CM

## 2023-06-27 NOTE — Patient Instructions (Signed)
Sequential compression boots   [facility]:   [facility]

## 2023-06-27 NOTE — Progress Notes (Signed)
Subjective:  Patient ID: Scott Oneal, male    DOB: [birth year]  Age: 82 y.o. MRN: [id number]  CC: Follow-up (2 week f/u, Dry mouth, Pain in legs a/w swelling, Fatigue... Rt upper inside of mouth causing pain and burning, Frequency in Urination discuss possible UTI.)   HPI Scott Oneal presents for edema, LBP, fatigue  We were asked to obtain an EKG for his upcoming colonoscopy.  Per my note from 8/13: "Scott Oneal presents for LBP - on epidural injections now: "they help for a day or two" C/o LLE swelling and pain - severe 7-8/10 all the time. Oxycodone - it stopped working the other day and [name] stopped taking it.  The pain is worse. C/o weakness, insomnia due to pain, lack of energy He went to see Dr. Cari Caraway and Dr. Annie Sable.  Dr. Kathrene Bongo started him on a diuretic.  He does not recall the name.  Possibly, it is  spironolactone.  He is not taking Lasix.  The swelling is better yet."  Recent plan was:  "He is not a surgical candidate for any back surgery per Dr. Shon Baton and Dr. Yevette Edwards He has had epidural injections by Dr. Drucie Ip Low back pain -  - severe 7-8/10 all the time. Oxycodone - it stopped working the other day and [name] stopped taking it.  The pain is worse.  He will take Percocet 10/325 up to 4 times a day."    Outpatient Medications Prior to Visit  Medication Sig Dispense Refill   aspirin EC 81 MG tablet Take 1 tablet (81 mg total) by mouth 2 (two) times daily. 100 tablet 3   atorvastatin (LIPITOR) 40 MG tablet Take 1 tablet (40 mg total) by mouth daily. 90 tablet 3   calcitRIOL (ROCALTROL) 0.25 MCG capsule TAKE 1 CAPSULE BY MOUTH EVERY DAY 90 capsule 1   cetirizine (ZYRTEC) 10 MG tablet Take 10 mg by mouth daily as needed for allergies.     Cholecalciferol 2000 UNITS TABS Take 2,000 Units by mouth daily.     clonazePAM (KLONOPIN) 0.5 MG tablet Take 1 tablet (0.5 mg total) by mouth 2 (two) times daily as needed for anxiety. 60 tablet 3    diclofenac sodium (VOLTAREN) 1 % GEL APPLY 4 G TOPICALLY 4 TIMES DAILY. (Patient taking differently: Apply 4 g topically 4 (four) times daily as needed (pain).) 200 g 5   ELIQUIS 5 MG TABS tablet Take 5 mg by mouth 2 (two) times daily.     furosemide (LASIX) 40 MG tablet Take 40 mg by mouth daily.     hydrALAZINE (APRESOLINE) 10 MG tablet Take 1-2 tablet 3 times a day as needed if blood pressure is >170 (Patient not taking: Reported on 06/30/2023) 60 tablet 3   ipratropium (ATROVENT) 0.03 % nasal spray Place 2 sprays into both nostrils 3 (three) times daily. Use before meals 30 mL 3   ketoconazole (NIZORAL) 2 % cream Apply 1 Application topically 2 (two) times daily. Foot rash 45 g 1   levothyroxine (SYNTHROID) 175 MCG tablet TAKE 1 TABLET BY MOUTH EVERY DAY 90 tablet 3   lipase/protease/amylase (CREON) 36000 UNITS CPEP capsule TAKE 2 CAPSULES BY MOUTH 3 TIMES A DAY WITH MEALS, MAY TAKE 1 CAPSULE WITH SNACKS AS NEEDED 300 capsule 5   Multiple Vitamins-Minerals (PRESERVISION AREDS 2) CAPS Take 1 capsule by mouth in the morning and at bedtime.     nitroGLYCERIN (NITROSTAT) 0.4 MG SL tablet Place 0.4 mg under the tongue  every 5 (five) minutes as needed for chest pain.     oxyCODONE-acetaminophen (PERCOCET) 10-325 MG tablet Take 0.5-1 tablets by mouth every 6 (six) hours as needed. 120 tablet 0   pantoprazole (PROTONIX) 40 MG tablet TAKE 1 TABLET BY MOUTH TWICE A DAY 180 tablet 1   Propylene Glycol (SYSTANE BALANCE) 0.6 % SOLN Place 1 drop into both eyes daily as needed (Dry eye).     SYRINGE-NEEDLE, DISP, 3 ML (BD ECLIPSE SYRINGE) 23G X 1" 3 ML MISC As directed IM 50 each 2   tamsulosin (FLOMAX) 0.4 MG CAPS capsule Take 0.4 mg by mouth daily.     testosterone cypionate (DEPOTESTOSTERONE CYPIONATE) 200 MG/ML injection INJECT INTO MUSCLE EVERY 2 WEEKS 12 mL 1   carvedilol (COREG) 25 MG tablet TAKE 1 TABLET BY MOUTH TWICE A DAY WITH MEALS 180 tablet 1   isosorbide mononitrate (IMDUR) 30 MG 24 hr tablet  TAKE 1 TABLET BY MOUTH EVERY DAY 90 tablet 2   predniSONE (DELTASONE) 10 MG tablet Take 3 tablets (30 mg total) by mouth daily with breakfast. 90 tablet 3   No facility-administered medications prior to visit.    ROS: Review of Systems  Constitutional:  Positive for fatigue. Negative for appetite change and unexpected weight change.  HENT:  Negative for congestion, nosebleeds, sneezing, sore throat and trouble swallowing.   Eyes:  Negative for itching and visual disturbance.  Respiratory:  Negative for cough.   Cardiovascular:  Positive for leg swelling. Negative for chest pain and palpitations.  Gastrointestinal:  Negative for abdominal distention, blood in stool, diarrhea and nausea.  Genitourinary:  Negative for frequency and hematuria.  Musculoskeletal:  Positive for arthralgias, back pain and gait problem. Negative for joint swelling and neck pain.  Skin:  Positive for color change. Negative for rash.  Neurological:  Positive for weakness. Negative for dizziness, tremors and speech difficulty.  Hematological:  Bruises/bleeds easily.  Psychiatric/Behavioral:  Negative for agitation, dysphoric mood and sleep disturbance. The patient is not nervous/anxious.     Objective:  BP (!) 82/60 (BP Location: Left Arm, Patient Position: Sitting, Cuff Size: Large)   Pulse (!) 136   Temp 98.3 F (36.8 C) (Oral)   Ht 6' (1.829 m)   Wt 188 lb (85.3 kg)   SpO2 97%   BMI 25.50 kg/m   BP Readings from Last 3 Encounters:  06/30/23 (!) 98/52  06/27/23 (!) 82/60  06/13/23 102/60    Wt Readings from Last 3 Encounters:  06/30/23 182 lb 3.2 oz (82.6 kg)  06/27/23 188 lb (85.3 kg)  06/13/23 188 lb (85.3 kg)    Physical Exam Constitutional:      General: He is not in acute distress.    Appearance: Normal appearance. He is well-developed. He is not toxic-appearing.     Comments: NAD  Eyes:     Conjunctiva/sclera: Conjunctivae normal.     Pupils: Pupils are equal, round, and reactive to  light.  Neck:     Thyroid: No thyromegaly.     Vascular: No JVD.  Cardiovascular:     Rate and Rhythm: Normal rate and regular rhythm.     Heart sounds: Normal heart sounds. No murmur heard.    No friction rub. No gallop.  Pulmonary:     Effort: Pulmonary effort is normal. No respiratory distress.     Breath sounds: Normal breath sounds. No wheezing or rales.  Chest:     Chest wall: No tenderness.  Abdominal:  General: Bowel sounds are normal. There is no distension.     Palpations: Abdomen is soft. There is no mass.     Tenderness: There is no abdominal tenderness. There is no guarding or rebound.  Musculoskeletal:        General: Tenderness present. Normal range of motion.     Cervical back: Normal range of motion.     Right lower leg: Edema present.     Left lower leg: Edema present.  Lymphadenopathy:     Cervical: No cervical adenopathy.  Skin:    General: Skin is warm and dry.     Findings: Erythema present. No rash.  Neurological:     Mental Status: He is alert and oriented to person, place, and time.     Cranial Nerves: No cranial nerve deficit.     Motor: No abnormal muscle tone.     Coordination: Coordination abnormal.     Gait: Gait abnormal.     Deep Tendon Reflexes: Reflexes are normal and symmetric.  Psychiatric:        Behavior: Behavior normal.        Thought Content: Thought content normal.        Judgment: Judgment normal.   Antalgic gait LS w/pain  Procedure: EKG Indication: tachycardia Impression: NSR. PACs HR 75. No new changes since 01/2022.   Lab Results  Component Value Date   WBC 6.2 06/30/2023   HGB 11.0 (L) 06/30/2023   HCT 34.8 (L) 06/30/2023   PLT 152 06/30/2023   GLUCOSE 107 (H) 06/30/2023   CHOL 143 06/25/2022   TRIG 88 06/25/2022   HDL 42 06/25/2022   LDLCALC 83 06/25/2022   ALT 23 06/30/2023   AST 50 (H) 06/30/2023   NA 142 06/30/2023   K 4.7 06/30/2023   CL 98 06/30/2023   CREATININE 2.67 (H) 06/30/2023   BUN 39 (H)  06/30/2023   CO2 29 06/30/2023   TSH 2.89 04/06/2023   PSA 2.58 07/05/2022   INR 1.3 (H) 06/23/2022   HGBA1C 6.4 04/06/2023   MICROALBUR 1.2 05/18/2017    VAS US CAROTID  Result Date: 06/01/2023 Carotid Arterial Duplex Study Patient Name:  MICHAIL GARTEN  Date of Exam:   06/01/2023 Medical Rec #: [id number]      Accession #:    [id number] Date of Birth: 04-Oct-1941     Patient Gender: M Patient Age:   17 years Exam Location:  [facility] Procedure:      VAS US CAROTID Referring Phys: Cristal Deer DICKSON --------------------------------------------------------------------------------  Indications: Syncope. Performing Technologist: Dorthula Matas RVS, RCS  Examination Guidelines: A complete evaluation includes B-mode imaging, spectral Doppler, color Doppler, and power Doppler as needed of all accessible portions of each vessel. Bilateral testing is considered an integral part of a complete examination. Limited examinations for reoccurring indications may be performed as noted.  Right Carotid Findings: +----------+--------+--------+--------+------------------+--------+           PSV cm/sEDV cm/sStenosisPlaque DescriptionComments +----------+--------+--------+--------+------------------+--------+ CCA Prox  90      16                                         +----------+--------+--------+--------+------------------+--------+ CCA Mid   68      16                                         +----------+--------+--------+--------+------------------+--------+  CCA Distal73      13              heterogenous               +----------+--------+--------+--------+------------------+--------+ ICA Prox  74      12      1-39%   heterogenous               +----------+--------+--------+--------+------------------+--------+ ICA Mid   66      22                                         +----------+--------+--------+--------+------------------+--------+ ICA Distal117     35                                          +----------+--------+--------+--------+------------------+--------+ ECA       89      9                                          +----------+--------+--------+--------+------------------+--------+ +----------+--------+-------+----------------+-------------------+           PSV cm/sEDV cmsDescribe        Arm Pressure (mmHG) +----------+--------+-------+----------------+-------------------+ [id number]            Multiphasic, [id number]                 +----------+--------+-------+----------------+-------------------+ +---------+--------+--+--------+--+---------+ VertebralPSV cm/s58EDV cm/s17Antegrade +---------+--------+--+--------+--+---------+  Left Carotid Findings: +----------+--------+--------+--------+------------------+--------+           PSV cm/sEDV cm/sStenosisPlaque DescriptionComments +----------+--------+--------+--------+------------------+--------+ CCA Prox  118     22                                         +----------+--------+--------+--------+------------------+--------+ CCA Mid   104     16              heterogenous               +----------+--------+--------+--------+------------------+--------+ CCA Distal99      18              calcific                   +----------+--------+--------+--------+------------------+--------+ ICA Prox  63      12      1-39%   calcific                   +----------+--------+--------+--------+------------------+--------+ ICA Mid   80      28                                         +----------+--------+--------+--------+------------------+--------+ ICA Distal66      24                                         +----------+--------+--------+--------+------------------+--------+ ECA       110     11                                         +----------+--------+--------+--------+------------------+--------+  +----------+--------+--------+----------------+-------------------+  PSV cm/sEDV cm/sDescribe        Arm Pressure (mmHG) +----------+--------+--------+----------------+-------------------+ Subclavian114             Multiphasic, [id number]                 +----------+--------+--------+----------------+-------------------+ +---------+--------+--+--------+--+---------+ VertebralPSV cm/s45EDV cm/s10Antegrade +---------+--------+--+--------+--+---------+   Summary: Right Carotid: Velocities in the right ICA are consistent with a 1-39% stenosis. Left Carotid: Velocities in the left ICA are consistent with a 1-39% stenosis. Vertebrals:  Bilateral vertebral arteries demonstrate antegrade flow. Subclavians: Normal flow hemodynamics were seen in bilateral subclavian              arteries. *See table(s) above for measurements and observations.  Electronically signed by Waverly Ferrari MD on 06/01/2023 at 9:37:45 AM.    Final    VAS [place] IVC/ILIAC (VENOUS ONLY)  Result Date: 06/01/2023 IVC/ILIAC STUDY Patient Name:  FRANKY BUCCO  Date of Exam:   06/01/2023 Medical Rec #: [id number]      Accession #:    [id number] Date of Birth: 08-05-1941     Patient Gender: M Patient Age:   22 years Exam Location:  [facility] Procedure:      VAS [place] IVC/ILIAC (VENOUS ONLY) Referring Phys: Cristal Deer DICKSON --------------------------------------------------------------------------------  Indications: IVC iliac evaluation Vascular Interventions: 06/24/22: mechanical thrombectomy of left common iliac                         vein, left external iliac vein, left common femoral                         vein, left femoral vein.  Performing Technologist: Dorthula Matas RVS, RCS  Examination Guidelines: A complete evaluation includes B-mode imaging, spectral Doppler, color Doppler, and power Doppler as needed of all accessible portions of each vessel. Bilateral testing is considered an integral part of a  complete examination. Limited examinations for reoccurring indications may be performed as noted.  IVC/Iliac Findings: +----------+------+--------+--------+    IVC    PatentThrombusComments +----------+------+--------+--------+ IVC Prox  patent                 +----------+------+--------+--------+ IVC Mid   patent                 +----------+------+--------+--------+ IVC Distalpatent                 +----------+------+--------+--------+  +-------------------+---------+-----------+---------+-----------+--------+         CIV        RT-PatentRT-ThrombusLT-PatentLT-ThrombusComments +-------------------+---------+-----------+---------+-----------+--------+ Common Iliac Prox                       patent                      +-------------------+---------+-----------+---------+-----------+--------+ Common Iliac Distal                     patent                      +-------------------+---------+-----------+---------+-----------+--------+  +-------------------------+---------+-----------+---------+-----------+--------+            EIV           RT-PatentRT-ThrombusLT-PatentLT-ThrombusComments +-------------------------+---------+-----------+---------+-----------+--------+ External Iliac Vein Prox                      patent                      +-------------------------+---------+-----------+---------+-----------+--------+  External Iliac Vein Mid                       patent                      +-------------------------+---------+-----------+---------+-----------+--------+ External Iliac Vein                           patent                      Distal                                                                    +-------------------------+---------+-----------+---------+-----------+--------+    Summary: IVC/Iliac: No evidence of thrombus in IVC and Iliac veins.  *See table(s) above for measurements and observations.  Electronically signed  by Waverly Ferrari MD on 06/01/2023 at 9:03:15 AM.    Final     Assessment & Plan:   Problem List Items Addressed This Visit     Hypothyroidism    Chronic Cont on Levothroid      Essential hypertension    BP Readings from Last 3 Encounters:  06/30/23 (!) 98/52  06/27/23 (!) 82/60  06/13/23 102/60  Will adjust BP meds if needed       Low back pain    He is not a surgical candidate for any back surgery per Dr. Shon Baton and Dr. Yevette Edwards He has had epidural injections by Dr. Drucie Ip Low back pain -  - severe 7-8/10 all the time. Oxycodone - it stopped working the other day and [name] stopped taking it.  The pain is worse.  He will take Percocet 10/325 up to 4 times a day.  Potential benefits of a long term opioids use as well as potential risks (i.e. addiction risk, apnea etc) and complications (i.e. Somnolence, constipation and others) were explained to the patient and were aknowledged.       Tachycardia - Primary    He is in normal sinus rhythm today.  EKG without tachycardia      Relevant Orders   EKG 12-Lead (Completed)      No orders of the defined types were placed in this encounter.     Follow-up: Return in about 4 weeks (around 07/25/2023) for a follow-up visit.  Sonda Primes, MD

## 2023-06-28 DIAGNOSIS — M47816 Spondylosis without myelopathy or radiculopathy, lumbar region: Secondary | ICD-10-CM | POA: Diagnosis not present

## 2023-06-30 ENCOUNTER — Encounter: Payer: Self-pay | Admitting: Cardiovascular Disease

## 2023-06-30 ENCOUNTER — Ambulatory Visit: Payer: PPO | Attending: Cardiovascular Disease | Admitting: Cardiovascular Disease

## 2023-06-30 VITALS — BP 98/52 | HR 76 | Ht 66.0 in | Wt 182.2 lb

## 2023-06-30 DIAGNOSIS — I959 Hypotension, unspecified: Secondary | ICD-10-CM

## 2023-06-30 DIAGNOSIS — I10 Essential (primary) hypertension: Secondary | ICD-10-CM | POA: Diagnosis not present

## 2023-06-30 DIAGNOSIS — I2510 Atherosclerotic heart disease of native coronary artery without angina pectoris: Secondary | ICD-10-CM | POA: Diagnosis not present

## 2023-06-30 DIAGNOSIS — Z86718 Personal history of other venous thrombosis and embolism: Secondary | ICD-10-CM | POA: Diagnosis not present

## 2023-06-30 DIAGNOSIS — N184 Chronic kidney disease, stage 4 (severe): Secondary | ICD-10-CM

## 2023-06-30 DIAGNOSIS — E782 Mixed hyperlipidemia: Secondary | ICD-10-CM

## 2023-06-30 MED ORDER — CARVEDILOL 6.25 MG PO TABS
6.2500 mg | ORAL_TABLET | Freq: Two times a day (BID) | ORAL | 3 refills | Status: DC
Start: 2023-06-30 — End: 2023-07-24

## 2023-06-30 NOTE — Progress Notes (Unsigned)
Cardiology Office Note:    Date:  07/02/2023   ID:  Scott Oneal, DOB 1941/10/19, MRN [id number]  PCP:  Tresa Garter, MD   [facility] Providers Cardiologist:  Tonny Bollman, MD     Referring MD: Tresa Garter, MD   Chief Complaint  Patient presents with   Weakness    History of Present Illness:    Scott Oneal is a 82 y.o. male with a hx of;  Coronary artery disease S/p inferior MI in 2005 >> PCI: overlapping DES to RCA Staged PCI: DES to LCx Cath 4/21: LCx and RCA stents patent w mod ISR, mod mid and dist LAD dz >> med rx Combined Systolic and Diastolic CHF Echocardiogram 12/2019: EF 45-50, Gr 2 DD Chronic kidney disease Stage 4 Hypertension Hyperlipidemia  The patient is here alone today.  He has been feeling kind of weak and tired.  He has not had presyncope or frank syncope.  He has mild dizziness when he first stands up and he is able to steady himself.  He has not had any falls.  He is asymptomatic when he is walking.  He denies chest pain or shortness of breath.  He was recently seen by his PCP and his blood pressure was low with a systolic blood pressure of 82 mmHg.  His blood pressure remains low today.  He otherwise states that he is doing okay.  He has had some chronic leg swelling ever since he had a DVT and he has been maintained on apixaban for anticoagulation after undergoing venous intervention by vascular surgery last year.  Past Medical History:  Diagnosis Date   Allergy    Arthritis    Calculus of gallbladder without mention of cholecystitis    Cataract    bilateral cateracts removed   Celiac disease    Chronic kidney disease    stage 3   Coronary atherosclerosis of unspecified type of vessel, native or graft    Dermatitis 06/08/2021   Erythema of lower extremity 09/08/2021   Esophageal reflux    Glaucoma    History of cellulitis 09/08/2021   History of kidney stones    Loss of weight    Lower extremity weakness  05/31/2021   Macular degeneration    Rt eye . injections   Old myocardial infarction    Osteoporosis, unspecified    Other malaise and fatigue    Other specified cardiac dysrhythmias(427.89)    Other testicular hypofunction    Pancreatic insufficiency 05/31/2021   Personal history of colonic polyps    Postural dizziness with presyncope 12/13/2021   Pure hypercholesterolemia    Thrombocytopenia (HCC)    chronic, per PCP notes dating back to 2009   Tinea pedis 05/31/2021   Unspecified asthma(493.90)    Unspecified essential hypertension    Unspecified hypothyroidism    Venous stasis dermatitis 09/08/2021    Past Surgical History:  Procedure Laterality Date   ARTERY BIOPSY Left 02/18/2022   Procedure: LEFT TEMPORAL ARTERY BIOPSY;  Surgeon: Sheliah Hatch De Blanch, MD;  Location: [facility];  Service: General;  Laterality: Left;   cataract surgery Bilateral    COLONOSCOPY  06/26/2017   Russella Dar   COLONOSCOPY WITH PROPOFOL N/A 11/27/2014   Procedure: COLONOSCOPY WITH PROPOFOL;  Surgeon: Rachael Fee, MD;  Location: [facility] ENDOSCOPY;  Service: Endoscopy;  Laterality: N/A;   CORONARY ANGIOPLASTY     CORONARY STENT PLACEMENT  2011   Cypher; in distal circumflex artery   CYSTOSCOPY  1998  IR URETERAL STENT LEFT NEW ACCESS W/O SEP NEPHROSTOMY CATH  06/21/2021   KNEE SURGERY     right   LEFT HEART CATH AND CORONARY ANGIOGRAPHY N/A 01/31/2020   Procedure: LEFT HEART CATH AND CORONARY ANGIOGRAPHY;  Surgeon: Lyn Records, MD;  Location: [facility] INVASIVE CV LAB;  Service: Cardiovascular;  Laterality: N/A;   NEPHROLITHOTOMY Left 06/21/2021   Procedure: NEPHROLITHOTOMY PERCUTANEOUS;  Surgeon: Marcine Matar, MD;  Location: [facility];  Service: Urology;  Laterality: Left;  90 MINS   PERIPHERAL VASCULAR THROMBECTOMY Left 06/24/2022   Procedure: PERIPHERAL VASCULAR THROMBECTOMY;  Surgeon: Leonie Douglas, MD;  Location: [facility] CV [facility];  Service: Cardiovascular;  Laterality: Left;   SHOULDER SURGERY  Left 07/2013   Dr Colvin Caroli   UPPER GASTROINTESTINAL ENDOSCOPY      Current Medications: Current Meds  Medication Sig   aspirin EC 81 MG tablet Take 1 tablet (81 mg total) by mouth 2 (two) times daily.   atorvastatin (LIPITOR) 40 MG tablet Take 1 tablet (40 mg total) by mouth daily.   calcitRIOL (ROCALTROL) 0.25 MCG capsule TAKE 1 CAPSULE BY MOUTH EVERY DAY   carvedilol (COREG) 6.25 MG tablet Take 1 tablet (6.25 mg total) by mouth 2 (two) times daily.   cetirizine (ZYRTEC) 10 MG tablet Take 10 mg by mouth daily as needed for allergies.   Cholecalciferol 2000 UNITS TABS Take 2,000 Units by mouth daily.   clonazePAM (KLONOPIN) 0.5 MG tablet Take 1 tablet (0.5 mg total) by mouth 2 (two) times daily as needed for anxiety.   diclofenac sodium (VOLTAREN) 1 % GEL APPLY 4 G TOPICALLY 4 TIMES DAILY. (Patient taking differently: Apply 4 g topically 4 (four) times daily as needed (pain).)   ELIQUIS 5 MG TABS tablet Take 5 mg by mouth 2 (two) times daily.   furosemide (LASIX) 40 MG tablet Take 40 mg by mouth daily.   ipratropium (ATROVENT) 0.03 % nasal spray Place 2 sprays into both nostrils 3 (three) times daily. Use before meals   ketoconazole (NIZORAL) 2 % cream Apply 1 Application topically 2 (two) times daily. Foot rash   levothyroxine (SYNTHROID) 175 MCG tablet TAKE 1 TABLET BY MOUTH EVERY DAY   lipase/protease/amylase (CREON) 36000 UNITS CPEP capsule TAKE 2 CAPSULES BY MOUTH 3 TIMES A DAY WITH MEALS, MAY TAKE 1 CAPSULE WITH SNACKS AS NEEDED   Multiple Vitamins-Minerals (PRESERVISION AREDS 2) CAPS Take 1 capsule by mouth in the morning and at bedtime.   nitroGLYCERIN (NITROSTAT) 0.4 MG SL tablet Place 0.4 mg under the tongue every 5 (five) minutes as needed for chest pain.   oxyCODONE-acetaminophen (PERCOCET) 10-325 MG tablet Take 0.5-1 tablets by mouth every 6 (six) hours as needed.   pantoprazole (PROTONIX) 40 MG tablet TAKE 1 TABLET BY MOUTH TWICE A DAY   Propylene Glycol (SYSTANE BALANCE) 0.6 %  SOLN Place 1 drop into both eyes daily as needed (Dry eye).   SYRINGE-NEEDLE, DISP, 3 ML (BD ECLIPSE SYRINGE) 23G X 1" 3 ML MISC As directed IM   tamsulosin (FLOMAX) 0.4 MG CAPS capsule Take 0.4 mg by mouth daily.   testosterone cypionate (DEPOTESTOSTERONE CYPIONATE) 200 MG/ML injection INJECT INTO MUSCLE EVERY 2 WEEKS   [DISCONTINUED] carvedilol (COREG) 25 MG tablet TAKE 1 TABLET BY MOUTH TWICE A DAY WITH MEALS   [DISCONTINUED] isosorbide mononitrate (IMDUR) 30 MG 24 hr tablet TAKE 1 TABLET BY MOUTH EVERY DAY     Allergies:   Biaxin [clarithromycin], Tizanidine, Allantoin-pramoxine, Amlodipine, Amoxicillin, Benzalkonium chloride, Doxycycline, and Metoprolol  tartrate   Social History   Socioeconomic History   Marital status: Married    Spouse name: Scott Oneal   Number of children: 3   Years of education: Not on file   Highest education level: Not on file  Occupational History   Occupation: retired    Associate Professor: RETIRED    Comment: Engineering  Tobacco Use   Smoking status: Never    Passive exposure: Never   Smokeless tobacco: Never  Vaping Use   Vaping status: Never Used  Substance and Sexual Activity   Alcohol use: No   Drug use: No   Sexual activity: Yes  Other Topics Concern   Not on file  Social History Narrative   Retired.   Regular Exercise-Yes; yoga & silver sneakers   Daily Caffeine Use.            Social Determinants of Health   Financial Resource Strain: Low Risk  (07/06/2022)   Overall Financial Resource Strain (CARDIA)    Difficulty of Paying Living Expenses: Not hard at all  Food Insecurity: No Food Insecurity (07/06/2022)   Hunger Vital Sign    Worried About Running Out of Food in the Last Year: Never true    Ran Out of Food in the Last Year: Never true  Transportation Needs: No Transportation Needs (07/06/2022)   PRAPARE - Administrator, Civil Service (Medical): No    Lack of Transportation (Non-Medical): No  Physical Activity: Inactive  (05/16/2023)   Exercise Vital Sign    Days of Exercise per Week: 0 days    Minutes of Exercise per Session: 0 min  Stress: Stress Concern Present (05/16/2023)   [employer] of Occupational Health - Occupational Stress Questionnaire    Feeling of Stress : To some extent  Social Connections: Socially Integrated (07/06/2022)   Social Connection and Isolation Panel [NHANES]    Frequency of Communication with Friends and Family: More than three times a week    Frequency of Social Gatherings with Friends and Family: More than three times a week    Attends Religious Services: More than 4 times per year    Active Member of [employer] or Organizations: Yes    Attends Engineer, structural: More than 4 times per year    Marital Status: Married     Family History: The patient's family history includes Colon cancer (age of onset: 56) in his son; Hypertension in an other family member; Lymphoma in his father; Vision loss in his maternal grandmother. There is no history of Asthma, Esophageal cancer, Rectal cancer, Stomach cancer, or Colon polyps.  ROS:   Please see the history of present illness.    Positive for fatigue, weakness, leg swelling, and back pain.  All other systems reviewed and are negative.  EKGs/Labs/Other Studies Reviewed:         Recent Labs: 04/06/2023: TSH 2.89 06/30/2023: ALT 23; BUN 39; Creatinine, Ser 2.67; Hemoglobin 11.0; Platelets 152; Potassium 4.7; Sodium 142  Recent Lipid Panel    Component Value Date/Time   CHOL 143 06/25/2022 0759   CHOL 136 06/15/2022 0726   TRIG 88 06/25/2022 0759   HDL 42 06/25/2022 0759   HDL 56 06/15/2022 0726   CHOLHDL 3.4 06/25/2022 0759   VLDL 18 06/25/2022 0759   LDLCALC 83 06/25/2022 0759   LDLCALC 65 06/15/2022 0726     Risk Assessment/Calculations:          Physical Exam:    VS:  BP (!) 98/52 (BP  Location: Right Arm, Patient Position: Sitting, Cuff Size: Normal)   Pulse 76   Ht 5\' 6"  (1.676 m)   Wt 182 lb 3.2 oz  (82.6 kg)   SpO2 94%   BMI 29.41 kg/m     Wt Readings from Last 3 Encounters:  06/30/23 182 lb 3.2 oz (82.6 kg)  06/27/23 188 lb (85.3 kg)  06/13/23 188 lb (85.3 kg)     GEN:  Well nourished, well developed in no acute distress HEENT: Normal NECK: No JVD; No carotid bruits LYMPHATICS: No lymphadenopathy CARDIAC: RRR, no murmurs, rubs, gallops RESPIRATORY:  Clear to auscultation without rales, wheezing or rhonchi  ABDOMEN: Soft, non-tender, non-distended MUSCULOSKELETAL: 2+ bilateral pretibial edema; No deformity  SKIN: Warm and dry NEUROLOGIC:  Alert and oriented x 3 PSYCHIATRIC:  Normal affect   ASSESSMENT:    1. Mixed hyperlipidemia   2. Essential hypertension   3. CKD (chronic kidney disease) stage 4, GFR 15-29 ml/min (HCC)   4. Coronary artery disease involving native coronary artery of native heart without angina pectoris   5. Hypotension, unspecified hypotension type   6. History of DVT (deep vein thrombosis)    PLAN:    In order of problems listed above:  The patient is treated with atorvastatin 40 mg daily.  Lipids with an LDL of 83, cholesterol 143, HDL 42, triglycerides 88.  Considering his advanced age and comorbidities, I think we should continue his current medicines without change. Appears to be overtreated.  See below for discussion.  Currently with symptomatic hypotension. Followed by Dr. Kathrene Bongo with nephrology.  The last labs that I have on file are from June 2024 when his creatinine was 2.15 and potassium was 5.1.  Will update his labs today since he is experiencing hypotension to include a CBC and a complete metabolic panel. No anginal symptoms.  Continue aspirin for antiplatelet therapy.  I am going to stop isosorbide because of his low blood pressure. Unclear to me why he is now experiencing hypotension.  I have recommended that we reduce his carvedilol dose from 25 mg twice daily down to 6.25 mg twice daily.  We will stop isosorbide.  He will follow  his blood pressures and I will arrange a 4-week follow-up visit with an APP.  I am going to check his labs as above.  I am going to check an echocardiogram to evaluate for any significant changes with respect to his LV function or valvular heart disease.  He otherwise appears nontoxic today.  I reviewed his recent EKG from his primary care physician office when he was in normal sinus rhythm with a heart rate of 75 bpm. Maintained on apixaban by vascular surgery.  Vascular surgery follow-up is arranged.  Chronic leg edema noted.           Medication Adjustments/Labs and Tests Ordered: Current medicines are reviewed at length with the patient today.  Concerns regarding medicines are outlined above.  Orders Placed This Encounter  Procedures   CBC   Comprehensive metabolic panel   ECHOCARDIOGRAM COMPLETE   Meds ordered this encounter  Medications   carvedilol (COREG) 6.25 MG tablet    Sig: Take 1 tablet (6.25 mg total) by mouth 2 (two) times daily.    Dispense:  180 tablet    Refill:  3    Dose DECREASE    Patient Instructions  Medication Instructions:  STOP Isosorbide/Imdur DECREASE Coreg/Carvedilol to 6.25mg  twice daily *If you need a refill on your cardiac medications before your next  appointment, please call your pharmacy*  Lab Work: CMET, CBC today If you have labs (blood work) drawn today and your tests are completely normal, you will receive your results only by: MyChart Message (if you have MyChart) OR A paper copy in the mail If you have any lab test that is abnormal or we need to change your treatment, we will call you to review the results.  Testing/Procedures: ECHO Your physician has requested that you have an echocardiogram. Echocardiography is a painless test that uses sound waves to create images of your heart. It provides your doctor with information about the size and shape of your heart and how well your heart's chambers and valves are working. This procedure  takes approximately one hour. There are no restrictions for this procedure. Please do NOT wear cologne, perfume, aftershave, or lotions (deodorant is allowed). Please arrive 15 minutes prior to your appointment time.  Follow-Up: At [facility], you and your health needs are our priority.  As part of our continuing mission to provide you with exceptional heart care, we have created designated Provider Care Teams.  These Care Teams include your primary Cardiologist (physician) and Advanced Practice Providers (APPs -  Physician Assistants and Nurse Practitioners) who all work together to provide you with the care you need, when you need it.  Your next appointment:   4 week(s)  Provider:   Jaci Carrel, or Melissa Montane, MD  07/02/2023 2:57 PM    [facility]

## 2023-06-30 NOTE — Patient Instructions (Signed)
Medication Instructions:  STOP Isosorbide/Imdur DECREASE Coreg/Carvedilol to 6.25mg  twice daily *If you need a refill on your cardiac medications before your next appointment, please call your pharmacy*  Lab Work: CMET, CBC today If you have labs (blood work) drawn today and your tests are completely normal, you will receive your results only by: MyChart Message (if you have MyChart) OR A paper copy in the mail If you have any lab test that is abnormal or we need to change your treatment, we will call you to review the results.  Testing/Procedures: ECHO Your physician has requested that you have an echocardiogram. Echocardiography is a painless test that uses sound waves to create images of your heart. It provides your doctor with information about the size and shape of your heart and how well your heart's chambers and valves are working. This procedure takes approximately one hour. There are no restrictions for this procedure. Please do NOT wear cologne, perfume, aftershave, or lotions (deodorant is allowed). Please arrive 15 minutes prior to your appointment time.  Follow-Up: At [facility], you and your health needs are our priority.  As part of our continuing mission to provide you with exceptional heart care, we have created designated Provider Care Teams.  These Care Teams include your primary Cardiologist (physician) and Advanced Practice Providers (APPs -  Physician Assistants and Nurse Practitioners) who all work together to provide you with the care you need, when you need it.  Your next appointment:   4 week(s)  Provider:   Jaci Carrel, or [place]

## 2023-07-01 LAB — COMPREHENSIVE METABOLIC PANEL
ALT: 23 IU/L (ref 0–44)
AST: 50 IU/L — ABNORMAL HIGH (ref 0–40)
Albumin: 3.4 g/dL — ABNORMAL LOW (ref 3.7–4.7)
Alkaline Phosphatase: 527 IU/L — ABNORMAL HIGH (ref 44–121)
BUN/Creatinine Ratio: 15 (ref 10–24)
BUN: 39 mg/dL — ABNORMAL HIGH (ref 8–27)
Bilirubin Total: 0.9 mg/dL (ref 0.0–1.2)
CO2: 29 mmol/L (ref 20–29)
Calcium: 8.8 mg/dL (ref 8.6–10.2)
Chloride: 98 mmol/L (ref 96–106)
Creatinine, Ser: 2.67 mg/dL — ABNORMAL HIGH (ref 0.76–1.27)
Globulin, Total: 2 g/dL (ref 1.5–4.5)
Glucose: 107 mg/dL — ABNORMAL HIGH (ref 70–99)
Potassium: 4.7 mmol/L (ref 3.5–5.2)
Sodium: 142 mmol/L (ref 134–144)
Total Protein: 5.4 g/dL — ABNORMAL LOW (ref 6.0–8.5)
eGFR: 23 mL/min/{1.73_m2} — ABNORMAL LOW (ref >59)

## 2023-07-01 LAB — CBC
Hematocrit: 34.8 % — ABNORMAL LOW (ref 37.5–51.0)
Hemoglobin: 11 g/dL — ABNORMAL LOW (ref 13.0–17.7)
MCH: 28 pg (ref 26.6–33.0)
MCHC: 31.6 g/dL (ref 31.5–35.7)
MCV: 89 fL (ref 79–97)
Platelets: 152 10*3/uL (ref 150–450)
RBC: 3.93 x10E6/uL — ABNORMAL LOW (ref 4.14–5.80)
RDW: 14.4 % (ref 11.6–15.4)
WBC: 6.2 10*3/uL (ref 3.4–10.8)

## 2023-07-04 ENCOUNTER — Encounter: Payer: Self-pay | Admitting: Internal Medicine

## 2023-07-04 DIAGNOSIS — R000 Tachycardia, unspecified: Secondary | ICD-10-CM | POA: Insufficient documentation

## 2023-07-04 DIAGNOSIS — N183 Chronic kidney disease, stage 3 unspecified: Secondary | ICD-10-CM | POA: Diagnosis not present

## 2023-07-04 NOTE — Assessment & Plan Note (Signed)
He is in normal sinus rhythm today.  EKG without tachycardia

## 2023-07-04 NOTE — Assessment & Plan Note (Signed)
 He is not a surgical candidate for any back surgery per Dr. Shon Baton and Dr. Yevette Edwards He has had epidural injections by Dr. Drucie Ip Low back pain -  - severe 7-8/10 all the time. Oxycodone - it stopped working the other day and [name] stopped taking it.  The pain is worse.  He will take Percocet 10/325 up to 4 times a day.  Potential benefits of a long term opioids use as well as potential risks (i.e. addiction risk, apnea etc) and complications (i.e. Somnolence, constipation and others) were explained to the patient and were aknowledged.

## 2023-07-04 NOTE — Assessment & Plan Note (Signed)
BP Readings from Last 3 Encounters:  06/30/23 (!) 98/52  06/27/23 (!) 82/60  06/13/23 102/60  Will adjust BP meds if needed

## 2023-07-04 NOTE — Assessment & Plan Note (Signed)
Chronic Cont on Levothroid

## 2023-07-05 ENCOUNTER — Telehealth: Payer: Self-pay

## 2023-07-05 DIAGNOSIS — N184 Chronic kidney disease, stage 4 (severe): Secondary | ICD-10-CM

## 2023-07-05 DIAGNOSIS — I10 Essential (primary) hypertension: Secondary | ICD-10-CM

## 2023-07-05 NOTE — Telephone Encounter (Signed)
-----   Message from Tonny Bollman sent at 07/04/2023  3:04 PM EDT ----- Suspect creatinine elevated from baseline because of hypotension. Meds recently reduced. Please repeat BMET when he returns for follow-up echo on 9/19. thanks

## 2023-07-05 NOTE — Telephone Encounter (Signed)
Attempted to contact patient, left message to contact office back to review lab work - order placed for repeat BMET and lab appointment scheduled for same day as his echocardiogram on 07/20/23, patient just needs to be made aware of provider recommendation.   ----- Message from Tonny Bollman sent at 07/04/2023  3:04 PM EDT ----- Suspect creatinine elevated from baseline because of hypotension. Meds recently reduced. Please repeat BMET when he returns for follow-up echo on 9/19. thanks

## 2023-07-11 DIAGNOSIS — R319 Hematuria, unspecified: Secondary | ICD-10-CM | POA: Diagnosis not present

## 2023-07-11 DIAGNOSIS — M5136 Other intervertebral disc degeneration, lumbar region: Secondary | ICD-10-CM | POA: Diagnosis not present

## 2023-07-11 DIAGNOSIS — M48062 Spinal stenosis, lumbar region with neurogenic claudication: Secondary | ICD-10-CM | POA: Diagnosis not present

## 2023-07-11 DIAGNOSIS — I129 Hypertensive chronic kidney disease with stage 1 through stage 4 chronic kidney disease, or unspecified chronic kidney disease: Secondary | ICD-10-CM | POA: Diagnosis not present

## 2023-07-11 DIAGNOSIS — N320 Bladder-neck obstruction: Secondary | ICD-10-CM | POA: Diagnosis not present

## 2023-07-11 DIAGNOSIS — N183 Chronic kidney disease, stage 3 unspecified: Secondary | ICD-10-CM | POA: Diagnosis not present

## 2023-07-11 DIAGNOSIS — M47896 Other spondylosis, lumbar region: Secondary | ICD-10-CM | POA: Diagnosis not present

## 2023-07-11 DIAGNOSIS — Z6841 Body Mass Index (BMI) 40.0 and over, adult: Secondary | ICD-10-CM | POA: Diagnosis not present

## 2023-07-19 DIAGNOSIS — H353211 Exudative age-related macular degeneration, right eye, with active choroidal neovascularization: Secondary | ICD-10-CM | POA: Diagnosis not present

## 2023-07-20 ENCOUNTER — Ambulatory Visit (HOSPITAL_COMMUNITY): Payer: PPO | Attending: Cardiovascular Disease

## 2023-07-20 ENCOUNTER — Ambulatory Visit: Payer: PPO

## 2023-07-20 DIAGNOSIS — I2510 Atherosclerotic heart disease of native coronary artery without angina pectoris: Secondary | ICD-10-CM | POA: Diagnosis not present

## 2023-07-20 DIAGNOSIS — N184 Chronic kidney disease, stage 4 (severe): Secondary | ICD-10-CM | POA: Insufficient documentation

## 2023-07-20 DIAGNOSIS — E782 Mixed hyperlipidemia: Secondary | ICD-10-CM | POA: Diagnosis not present

## 2023-07-20 DIAGNOSIS — I10 Essential (primary) hypertension: Secondary | ICD-10-CM | POA: Insufficient documentation

## 2023-07-20 LAB — ECHOCARDIOGRAM COMPLETE
Area-P 1/2: 2.88 cm2
S' Lateral: 3.3 cm

## 2023-07-21 LAB — BASIC METABOLIC PANEL
BUN/Creatinine Ratio: 20 (ref 10–24)
BUN: 40 mg/dL — ABNORMAL HIGH (ref 8–27)
CO2: 29 mmol/L (ref 20–29)
Calcium: 9 mg/dL (ref 8.6–10.2)
Chloride: 101 mmol/L (ref 96–106)
Creatinine, Ser: 2.02 mg/dL — ABNORMAL HIGH (ref 0.76–1.27)
Glucose: 131 mg/dL — ABNORMAL HIGH (ref 70–99)
Potassium: 4.3 mmol/L (ref 3.5–5.2)
Sodium: 145 mmol/L — ABNORMAL HIGH (ref 134–144)
eGFR: 33 mL/min/{1.73_m2} — ABNORMAL LOW (ref >59)

## 2023-07-23 NOTE — Progress Notes (Unsigned)
Cardiology Office Note:    Date:  07/24/2023  ID:  Scott Oneal, Scott Oneal [birth year], MRN [id number] PCP: Tresa Garter, MD  [facility] Providers Cardiologist:  Tonny Bollman, MD       Patient Profile:      Coronary artery disease  S/p inferior MI in 2005 >> PCI: overlapping (33 x 30 mm, 8 x 30 mm) DES to RCA  Staged PCI: 2.5 x 8 mm DES to LCx Myoview 01/21/20: EF 47, no ischemia, intermediate risk (low EF)  Cath 01/31/20: LCx and RCA stents patent w mod (50) ISR, mod mid (65) and dist LAD (75) dz >> med rx HFmrEF (heart failure with mildly reduced ejection fraction)   TTE 01/28/2020: EF 45-50, global HK, mild LVH, Gr 2 DD, GLS -16%, normal RVSF, RVSP 40.3, trivial MR, AoV sclerosis (no AS), mild dilation of Asc Aorta (37 mm)  TTE 03/19/2021: EF 50-55 TTE 07/20/2023: EF 55-60, no RWMA, mild LVH, GR 1 DD, normal RVSF, mildly elevated PASP (RVSP 36.1), moderate LAE, trivial MR, AV sclerosis, RAP 8 Chronic kidney disease (eGFR 33) Carotid US 06/01/23: Bilateral ICA 1-39 ABIs 08/2016: Normal bilaterally Hx of DVT 05/2022 s/p mechanical thrombectomy of left common iliac vein, left external iliac vein, left common femoral vein, left femoral vein  Hypertension  Hyperlipidemia         History of Present Illness:  Discussed the use of AI scribe software for clinical note transcription with the patient, who gave verbal consent to proceed.  Scott Oneal is a 82 y.o. male who returns for follow-up on hypotension.  He was last seen by Dr. Excell Seltzer 06/30/2023.  He was experiencing symptomatic hypotension with blood pressure 98/52.  His carvedilol was decreased from 25 mg to 6.25 mg twice daily.  His isosorbide was stopped.  Follow-up echocardiogram demonstrated normal ejection fraction mild pulmonary hypertension.  Creatinine was increased to 2.67.  Plan was to repeat labs when he returns for follow-up.  Of note, his alkaline phosphatase was elevated at 527.  Hemoglobin was low at 11.0.  He is here  alone. He remains lightheaded/dizzy at times. He mentions a couple of falls. He denies any chest pain or significant shortness of breath. He sleeps on an incline and reports no difficulty breathing in this position. He has noticed swelling in his legs, which he manages with support hose. He also reports occasional shooting pains in his legs, which he describes as brief and not clearly related to activity. He has noticed a decrease in his appetite. He has lost ~ 20 lbs over the past year. He denies any fevers, chills, coughing, wheezing, or gastrointestinal symptoms.     ROS: See HPI    Studies Reviewed:   EKG Interpretation Date/Time:  Monday July 24 2023 10:48:32 EDT Ventricular Rate:  74 PR Interval:  96 QRS Duration:  78 QT Interval:  396 QTC Calculation: 439 R Axis:   -1  Text Interpretation: Sinus rhythm with short PR with occasional Premature ventricular complexes and Premature atrial complexes Confirmed by Tereso Newcomer [phone]) on 07/24/2023 10:51:28 AM    Risk Assessment/Calculations:             Physical Exam:   VS:  BP (!) 105/50   Pulse 72   Ht 6' (1.829 m)   Wt 177 lb 3.2 oz (80.4 kg)   SpO2 97%   BMI 24.03 kg/m    Wt Readings from Last 3 Encounters:  07/24/23 177 lb 3.2 oz (80.4 kg)  06/30/23 182 lb 3.2 oz (82.6 kg)  06/27/23 188 lb (85.3 kg)    Constitutional:      Appearance: Healthy appearance. Not in distress.  Neck:     Vascular: No JVR. JVD normal.  Pulmonary:     Breath sounds: Normal breath sounds. No wheezing. No rales.  Cardiovascular:     Normal rate. Regularly irregular rhythm.     Murmurs: There is no murmur.  Edema:    Peripheral edema present.    Pretibial: bilateral 1+ edema of the pretibial area.    Ankle: bilateral 1+ edema of the ankle.    Comments: Compression hose in place Abdominal:     Palpations: Abdomen is soft.     Tenderness: There is no abdominal tenderness.  Skin:    General: Skin is warm and dry.      Assessment and  Plan:     Hypotension Symptomatic with occasional dizziness and history of falls. Etiology not clear for low BP. He does note poor appetite. His weight is down from Jan 2024 (199) to 177 today.  -Reduce Carvedilol further to 3.125mg  twice daily. -Repeat CMET, CBC today -He has follow up with PCP tomorrow. -Follow-up here 3 months  Elevated Alkaline Phosphatase Etiology unclear. No clear symptoms of cholestasis. -Obtain repeat CMET today along with 5' nucleotidase to determine if elevation is of liver origin. -He may require further evaluation (He has follow up with PCP tomorrow)  Coronary Artery Disease History of inferior MI in 2005 treated with overlapping DES to the RCA and staged PCI with DES to the LCX. Last cath in 2021 with patent stents and moderate LAD disease. Currently asymptomatic without chest pain to suggest angina.. -Continue Aspirin 81mg  twice daily and Lipitor 40mg  daily, Nitroglycerin as needed.  Heart Failure with Mildly Reduced Ejection Fraction Previous EF 45-50 in 2021.  EF improved to normal on most recent echocardiogram in 07/2023.  Volume status stable.  NYHA IIb. -Reduce Carvedilol to 3.125mg  twice daily as noted due to low blood pressure. -Continue Lasix 40mg  daily.  Chronic Kidney Disease Followed by nephrology (Dr. Kathrene Bongo).  He has recent labs demonstrated increase in creatinine to 2.67. -Repeat CMET today. -Follow-up with nephrology as planned  Hyperlipidemia LDL close to goal in August 2023. -Continue Lipitor 40mg  daily.  Leg Pain He notes intermittent shooting pain in both legs, possibly related to previous DVT. No clear symptoms of claudication, peripheral arterial disease. -Continue current management. Consider ABIs/arterial US if symptoms worsen.  Abnormal lung exam He has diffuse wheezing on exam today. He reports on cough. Given elevated ALP, will get CXR to screen for malignancy. -Obtain chest X-ray today.            Dispo:  Return  in about 3 months (around 10/23/2023) for Routine Follow Up, w/ Dr. Excell Seltzer, or Tereso Newcomer, PA-C.  Signed, Tereso Newcomer, PA-C

## 2023-07-24 ENCOUNTER — Encounter: Payer: Self-pay | Admitting: Physician Assistant

## 2023-07-24 ENCOUNTER — Ambulatory Visit
Admission: RE | Admit: 2023-07-24 | Discharge: 2023-07-24 | Disposition: A | Discharge status: Home / Self Care | Payer: PPO | Source: Ambulatory Visit | Attending: Physician Assistant | Admitting: Physician Assistant

## 2023-07-24 ENCOUNTER — Ambulatory Visit: Payer: PPO | Attending: Physician Assistant | Admitting: Physician Assistant

## 2023-07-24 VITALS — BP 105/50 | HR 72 | Ht 72.0 in | Wt 177.2 lb

## 2023-07-24 DIAGNOSIS — I10 Essential (primary) hypertension: Secondary | ICD-10-CM | POA: Diagnosis not present

## 2023-07-24 DIAGNOSIS — I959 Hypotension, unspecified: Secondary | ICD-10-CM

## 2023-07-24 DIAGNOSIS — E782 Mixed hyperlipidemia: Secondary | ICD-10-CM

## 2023-07-24 DIAGNOSIS — R918 Other nonspecific abnormal finding of lung field: Secondary | ICD-10-CM | POA: Diagnosis not present

## 2023-07-24 DIAGNOSIS — I2510 Atherosclerotic heart disease of native coronary artery without angina pectoris: Secondary | ICD-10-CM

## 2023-07-24 DIAGNOSIS — I5022 Chronic systolic (congestive) heart failure: Secondary | ICD-10-CM | POA: Diagnosis not present

## 2023-07-24 DIAGNOSIS — R748 Abnormal levels of other serum enzymes: Secondary | ICD-10-CM | POA: Diagnosis not present

## 2023-07-24 DIAGNOSIS — N184 Chronic kidney disease, stage 4 (severe): Secondary | ICD-10-CM | POA: Diagnosis not present

## 2023-07-24 LAB — CBC

## 2023-07-24 MED ORDER — CARVEDILOL 3.125 MG PO TABS: 3.1250 mg | ORAL_TABLET | Freq: Two times a day (BID) | ORAL | 3 refills | Status: DC

## 2023-07-24 NOTE — Patient Instructions (Addendum)
Medication Instructions:  Your physician has recommended you make the following change in your medication:   REDUCE the Carvedilol to 3.125 taking 1 twice a day  *If you need a refill on your cardiac medications before your next appointment, please call your pharmacy*   Lab Work: TODAY:  CMET & CBC  If you have labs (blood work) drawn today and your tests are completely normal, you will receive your results only by: MyChart Message (if you have MyChart) OR A paper copy in the mail If you have any lab test that is abnormal or we need to change your treatment, we will call you to review the results.   Testing/Procedures: A chest x-ray takes a picture of the organs and structures inside the chest, including the heart, lungs, and blood vessels. This test can show several things, including, whether the heart is enlarges; whether fluid is building up in the lungs; and whether pacemaker / defibrillator leads are still in place. GO TO [facility] [address] [phone]   Follow-Up: At [facility], you and your health needs are our priority.  As part of our continuing mission to provide you with exceptional heart care, we have created designated Provider Care Teams.  These Care Teams include your primary Cardiologist (physician) and Advanced Practice Providers (APPs -  Physician Assistants and Nurse Practitioners) who all work together to provide you with the care you need, when you need it.  We recommend signing up for the patient portal called "MyChart".  Sign up information is provided on this After Visit Summary.  MyChart is used to connect with patients for Virtual Visits (Telemedicine).  Patients are able to view lab/test results, encounter notes, upcoming appointments, etc.  Non-urgent messages can be sent to your provider as well.   To learn more about what you can do with MyChart, go to [email or web address].    Your next appointment:   3  month(s)  Provider:   Tonny Bollman, MD  or Tereso Newcomer, PA-C         Other Instructions

## 2023-07-25 ENCOUNTER — Encounter: Payer: Self-pay | Admitting: Internal Medicine

## 2023-07-25 ENCOUNTER — Ambulatory Visit: Payer: PPO | Admitting: Internal Medicine

## 2023-07-25 VITALS — BP 105/60 | HR 68 | Temp 98.6°F | Ht 72.0 in | Wt 177.0 lb

## 2023-07-25 DIAGNOSIS — G43909 Migraine, unspecified, not intractable, without status migrainosus: Secondary | ICD-10-CM

## 2023-07-25 DIAGNOSIS — G4489 Other headache syndrome: Secondary | ICD-10-CM | POA: Diagnosis not present

## 2023-07-25 DIAGNOSIS — K900 Celiac disease: Secondary | ICD-10-CM | POA: Diagnosis not present

## 2023-07-25 DIAGNOSIS — E034 Atrophy of thyroid (acquired): Secondary | ICD-10-CM

## 2023-07-25 DIAGNOSIS — I82422 Acute embolism and thrombosis of left iliac vein: Secondary | ICD-10-CM | POA: Diagnosis not present

## 2023-07-25 DIAGNOSIS — I10 Essential (primary) hypertension: Secondary | ICD-10-CM

## 2023-07-25 DIAGNOSIS — M818 Other osteoporosis without current pathological fracture: Secondary | ICD-10-CM

## 2023-07-25 DIAGNOSIS — E291 Testicular hypofunction: Secondary | ICD-10-CM

## 2023-07-25 DIAGNOSIS — R609 Edema, unspecified: Secondary | ICD-10-CM | POA: Diagnosis not present

## 2023-07-25 DIAGNOSIS — I2510 Atherosclerotic heart disease of native coronary artery without angina pectoris: Secondary | ICD-10-CM

## 2023-07-25 MED ORDER — OXYCODONE-ACETAMINOPHEN 10-325 MG PO TABS: 0.5000 | ORAL_TABLET | Freq: Four times a day (QID) | ORAL | 0 refills | Status: DC | PRN

## 2023-07-25 NOTE — Assessment & Plan Note (Signed)
Chronic Cont on Levothroid

## 2023-07-25 NOTE — Progress Notes (Signed)
Subjective:  Patient ID: Scott Oneal, male    DOB: 12/27/1940  Age: 82 y.o. MRN: [id number]  CC: Follow-up (4 week f/u)   HPI Scott Oneal presents for stress, leg edema, pain, CAD  Scott Oneal broke her hip - at [facility] now  On Prednisone 10 mg/d - feeling better, R ear pain is better  Outpatient Medications Prior to Visit  Medication Sig Dispense Refill   aspirin EC 81 MG tablet Take 1 tablet (81 mg total) by mouth 2 (two) times daily. 100 tablet 3   atorvastatin (LIPITOR) 40 MG tablet Take 1 tablet (40 mg total) by mouth daily. 90 tablet 3   calcitRIOL (ROCALTROL) 0.25 MCG capsule TAKE 1 CAPSULE BY MOUTH EVERY DAY 90 capsule 1   carvedilol (COREG) 3.125 MG tablet Take 1 tablet (3.125 mg total) by mouth 2 (two) times daily. 180 tablet 3   cetirizine (ZYRTEC) 10 MG tablet Take 10 mg by mouth daily as needed for allergies.     Cholecalciferol 2000 UNITS TABS Take 2,000 Units by mouth daily.     clonazePAM (KLONOPIN) 0.5 MG tablet Take 1 tablet (0.5 mg total) by mouth 2 (two) times daily as needed for anxiety. 60 tablet 3   diclofenac sodium (VOLTAREN) 1 % GEL APPLY 4 G TOPICALLY 4 TIMES DAILY. 200 g 5   ELIQUIS 5 MG TABS tablet Take 5 mg by mouth 2 (two) times daily.     furosemide (LASIX) 40 MG tablet Take 40 mg by mouth daily.     hydrALAZINE (APRESOLINE) 10 MG tablet Take 1-2 tablet 3 times a day as needed if blood pressure is >170 60 tablet 3   ipratropium (ATROVENT) 0.03 % nasal spray Place 2 sprays into both nostrils 3 (three) times daily. Use before meals 30 mL 3   ketoconazole (NIZORAL) 2 % cream Apply 1 Application topically 2 (two) times daily. Foot rash 45 g 1   levothyroxine (SYNTHROID) 175 MCG tablet TAKE 1 TABLET BY MOUTH EVERY DAY 90 tablet 3   lipase/protease/amylase (CREON) 36000 UNITS CPEP capsule TAKE 2 CAPSULES BY MOUTH 3 TIMES A DAY WITH MEALS, MAY TAKE 1 CAPSULE WITH SNACKS AS NEEDED 300 capsule 5   Multiple Vitamins-Minerals (PRESERVISION AREDS 2) CAPS  Take 1 capsule by mouth in the morning and at bedtime.     nitroGLYCERIN (NITROSTAT) 0.4 MG SL tablet Place 0.4 mg under the tongue every 5 (five) minutes as needed for chest pain.     pantoprazole (PROTONIX) 40 MG tablet TAKE 1 TABLET BY MOUTH TWICE A DAY 180 tablet 1   predniSONE (DELTASONE) 10 MG tablet Take 30 mg by mouth every morning.     Propylene Glycol (SYSTANE BALANCE) 0.6 % SOLN Place 1 drop into both eyes daily as needed (Dry eye).     SYRINGE-NEEDLE, DISP, 3 ML (BD ECLIPSE SYRINGE) 23G X 1" 3 ML MISC As directed IM 50 each 2   tamsulosin (FLOMAX) 0.4 MG CAPS capsule Take 0.4 mg by mouth daily.     testosterone cypionate (DEPOTESTOSTERONE CYPIONATE) 200 MG/ML injection INJECT INTO MUSCLE EVERY 2 WEEKS 12 mL 1   oxyCODONE-acetaminophen (PERCOCET) 10-325 MG tablet Take 0.5-1 tablets by mouth every 6 (six) hours as needed. 120 tablet 0   No facility-administered medications prior to visit.    ROS: Review of Systems  Constitutional:  Positive for fatigue. Negative for appetite change and unexpected weight change.  HENT:  Negative for congestion, nosebleeds, sneezing, sore throat and trouble  swallowing.   Eyes:  Negative for itching and visual disturbance.  Respiratory:  Negative for cough.   Cardiovascular:  Negative for chest pain, palpitations and leg swelling.  Gastrointestinal:  Negative for abdominal distention, blood in stool, diarrhea and nausea.  Genitourinary:  Negative for frequency and hematuria.  Musculoskeletal:  Positive for back pain and gait problem. Negative for joint swelling and neck pain.  Skin:  Negative for rash.  Neurological:  Negative for dizziness, tremors, speech difficulty and weakness.  Psychiatric/Behavioral:  Negative for agitation, dysphoric mood, sleep disturbance and suicidal ideas. The patient is not nervous/anxious.     Objective:  BP 105/60 (BP Location: Left Arm, Patient Position: Sitting, Cuff Size: Normal)   Pulse 68   Temp 98.6 F  (37 C) (Oral)   Ht 6' (1.829 m)   Wt 177 lb (80.3 kg)   SpO2 97%   BMI 24.01 kg/m   BP Readings from Last 3 Encounters:  07/25/23 105/60  07/24/23 (!) 105/50  06/30/23 (!) 98/52    Wt Readings from Last 3 Encounters:  07/25/23 177 lb (80.3 kg)  07/24/23 177 lb 3.2 oz (80.4 kg)  06/30/23 182 lb 3.2 oz (82.6 kg)    Physical Exam Constitutional:      General: He is not in acute distress.    Appearance: Normal appearance. He is well-developed.     Comments: NAD  Eyes:     Conjunctiva/sclera: Conjunctivae normal.     Pupils: Pupils are equal, round, and reactive to light.  Neck:     Thyroid: No thyromegaly.     Vascular: No JVD.  Cardiovascular:     Rate and Rhythm: Normal rate and regular rhythm.     Heart sounds: Normal heart sounds. No murmur heard.    No friction rub. No gallop.  Pulmonary:     Effort: Pulmonary effort is normal. No respiratory distress.     Breath sounds: Normal breath sounds. No wheezing or rales.  Chest:     Chest wall: No tenderness.  Abdominal:     General: Bowel sounds are normal. There is no distension.     Palpations: Abdomen is soft. There is no mass.     Tenderness: There is no abdominal tenderness. There is no guarding or rebound.  Musculoskeletal:        General: Tenderness present. Normal range of motion.     Cervical back: Normal range of motion.     Right lower leg: Edema present.     Left lower leg: Edema present.  Lymphadenopathy:     Cervical: No cervical adenopathy.  Skin:    General: Skin is warm and dry.     Findings: No rash.  Neurological:     Mental Status: He is alert and oriented to person, place, and time.     Cranial Nerves: No cranial nerve deficit.     Motor: No abnormal muscle tone.     Coordination: Coordination normal.     Gait: Gait normal.     Deep Tendon Reflexes: Reflexes are normal and symmetric.  Psychiatric:        Behavior: Behavior normal.        Thought Content: Thought content normal.         Judgment: Judgment normal.   LS, LLE pain Head NT   A total time of 45 minutes was spent preparing to see the patient, reviewing tests, x-rays, operative reports and other medical records.  Also, obtaining history and performing comprehensive physical exam.  Additionally, counseling the patient regarding the above listed issues - stress w/Brenda, LBP, chronic pain.   Finally, documenting clinical information in the health records, coordination of care, educating the patient. It is a complex case.   Lab Results  Component Value Date   WBC 6.8 07/24/2023   HGB 10.9 (L) 07/24/2023   HCT 35.8 (L) 07/24/2023   PLT 156 07/24/2023   GLUCOSE 108 (H) 07/24/2023   CHOL 143 06/25/2022   TRIG 88 06/25/2022   HDL 42 06/25/2022   LDLCALC 83 06/25/2022   ALT 26 07/24/2023   AST 31 07/24/2023   NA 143 07/24/2023   K 3.9 07/24/2023   CL 100 07/24/2023   CREATININE 1.74 (H) 07/24/2023   BUN 29 (H) 07/24/2023   CO2 31 (H) 07/24/2023   TSH 2.89 04/06/2023   PSA 2.58 07/05/2022   INR 1.3 (H) 06/23/2022   HGBA1C 6.4 04/06/2023   MICROALBUR 1.2 05/18/2017    No results found.  Assessment & Plan:   Problem List Items Addressed This Visit     Hypothyroidism    Chronic Cont on Levothroid      Hypogonadism in male    On testosterone  Potential benefits of a long term testosterone use as well as potential risks  and complications were explained to the patient and were aknowledged.      Essential hypertension     BP Readings from Last 3 Encounters:  07/25/23 105/60  07/24/23 (!) 105/50  06/30/23 (!) 98/52         Coronary artery disease involving native coronary artery of native heart without angina pectoris    Symptoms of angina are controlled with isosorbide.  Continue with Coreg - dose reduced      Celiac disease    On Gluten free diet      Osteoporosis - Primary    On testosterone, Vit D Off steroids      Headache    On Prednisone 10 mg/d - feeling better, R ear pain  is better      Relevant Medications   oxyCODONE-acetaminophen (PERCOCET) 10-325 MG tablet   Edema    Better       Hemicrania    On Prednisone 10 mg/d - feeling better, R ear pain is better      Relevant Medications   predniSONE (DELTASONE) 10 MG tablet   oxyCODONE-acetaminophen (PERCOCET) 10-325 MG tablet   DVT (deep venous thrombosis) (HCC)    Reduce Eliquis to 2.5 mg bid in 1 mo due to bruising EC ASA 81 mg bid hold while on Eliquis         Meds ordered this encounter  Medications   oxyCODONE-acetaminophen (PERCOCET) 10-325 MG tablet    Sig: Take 0.5-1 tablets by mouth every 6 (six) hours as needed.    Dispense:  120 tablet    Refill:  0      Follow-up: Return in about 6 weeks (around 09/05/2023) for a follow-up visit.  Sonda Primes, MD

## 2023-07-25 NOTE — Assessment & Plan Note (Signed)
On testosterone  Potential benefits of a long term testosterone use as well as potential risks  and complications were explained to the patient and were aknowledged.

## 2023-07-25 NOTE — Assessment & Plan Note (Signed)
Better

## 2023-07-25 NOTE — Assessment & Plan Note (Signed)
On Prednisone 10 mg/d - feeling better, R ear pain is better

## 2023-07-25 NOTE — Assessment & Plan Note (Signed)
On testosterone, Vit D Off steroids

## 2023-07-25 NOTE — Assessment & Plan Note (Signed)
Symptoms of angina are controlled with isosorbide.  Continue with Coreg - dose reduced

## 2023-07-25 NOTE — Assessment & Plan Note (Signed)
  BP Readings from Last 3 Encounters:  07/25/23 105/60  07/24/23 (!) 105/50  06/30/23 (!) 98/52

## 2023-07-25 NOTE — Assessment & Plan Note (Signed)
On Gluten free diet

## 2023-07-25 NOTE — Assessment & Plan Note (Signed)
Reduce Eliquis to 2.5 mg bid in 1 mo due to bruising EC ASA 81 mg bid hold while on Eliquis

## 2023-07-26 LAB — COMPREHENSIVE METABOLIC PANEL
ALT: 26 IU/L (ref 0–44)
AST: 31 IU/L (ref 0–40)
Albumin: 3.5 g/dL — ABNORMAL LOW (ref 3.7–4.7)
Alkaline Phosphatase: 469 IU/L — ABNORMAL HIGH (ref 44–121)
BUN/Creatinine Ratio: 17 (ref 10–24)
BUN: 29 mg/dL — ABNORMAL HIGH (ref 8–27)
Bilirubin Total: 0.7 mg/dL (ref 0.0–1.2)
CO2: 31 mmol/L — ABNORMAL HIGH (ref 20–29)
Calcium: 8.8 mg/dL (ref 8.6–10.2)
Chloride: 100 mmol/L (ref 96–106)
Creatinine, Ser: 1.74 mg/dL — ABNORMAL HIGH (ref 0.76–1.27)
Globulin, Total: 2.2 g/dL (ref 1.5–4.5)
Glucose: 108 mg/dL — ABNORMAL HIGH (ref 70–99)
Potassium: 3.9 mmol/L (ref 3.5–5.2)
Sodium: 143 mmol/L (ref 134–144)
Total Protein: 5.7 g/dL — ABNORMAL LOW (ref 6.0–8.5)
eGFR: 39 mL/min/{1.73_m2} — ABNORMAL LOW (ref >59)

## 2023-07-26 LAB — CBC
Hematocrit: 35.8 % — ABNORMAL LOW (ref 37.5–51.0)
Hemoglobin: 10.9 g/dL — ABNORMAL LOW (ref 13.0–17.7)
MCH: 27.5 pg (ref 26.6–33.0)
MCHC: 30.4 g/dL — ABNORMAL LOW (ref 31.5–35.7)
MCV: 90 fL (ref 79–97)
Platelets: 156 10*3/uL (ref 150–450)
RBC: 3.97 x10E6/uL — ABNORMAL LOW (ref 4.14–5.80)
RDW: 14.5 % (ref 11.6–15.4)
WBC: 6.8 10*3/uL (ref 3.4–10.8)

## 2023-07-26 LAB — NUCLEOTIDASE, 5', BLOOD: 5-Nucleotidase: 31 IU/L — ABNORMAL HIGH (ref 0–18)

## 2023-07-28 ENCOUNTER — Telehealth: Payer: Self-pay | Admitting: *Deleted

## 2023-07-28 DIAGNOSIS — R748 Abnormal levels of other serum enzymes: Secondary | ICD-10-CM

## 2023-07-28 NOTE — Telephone Encounter (Signed)
-----   Message from Nurse Corky Crafts sent at 07/28/2023  8:22 AM EDT -----  ----- Message ----- From: Kennon Rounds Sent: 07/26/2023   5:07 PM EDT To: [address] Triage  Results sent to [name] via MyChart. See MyChart comments below: PLAN: -Obtain hepatic ultrasound (RUQ ultrasound) - Dx: elevated alkaline phosphatase -Refer to GI (Dx: elevated alkaline phosphatase)  Scott Oneal  The 5' Nucleotidase is somewhat elevated. Because this is elevated, I suspect the elevated alkaline phosphatase is from a liver source (usually the gallbladder). I will set you up for an abdominal ultrasound and refer you to gastroenterology to better evaluate this.  Tereso Newcomer, PA-C    07/26/2023 5:04 PM

## 2023-07-28 NOTE — Addendum Note (Signed)
Addended by: Burnetta Sabin on: 07/28/2023 01:41 PM   Modules accepted: Orders

## 2023-08-02 ENCOUNTER — Ambulatory Visit (HOSPITAL_BASED_OUTPATIENT_CLINIC_OR_DEPARTMENT_OTHER)
Admission: RE | Admit: 2023-08-02 | Discharge: 2023-08-02 | Disposition: A | Discharge status: Home / Self Care | Payer: PPO | Source: Ambulatory Visit | Attending: Physician Assistant | Admitting: Physician Assistant

## 2023-08-02 DIAGNOSIS — R748 Abnormal levels of other serum enzymes: Secondary | ICD-10-CM | POA: Diagnosis not present

## 2023-08-02 DIAGNOSIS — K8020 Calculus of gallbladder without cholecystitis without obstruction: Secondary | ICD-10-CM | POA: Diagnosis not present

## 2023-08-03 DIAGNOSIS — M5459 Other low back pain: Secondary | ICD-10-CM | POA: Diagnosis not present

## 2023-08-03 DIAGNOSIS — M5416 Radiculopathy, lumbar region: Secondary | ICD-10-CM | POA: Diagnosis not present

## 2023-08-08 DIAGNOSIS — M5416 Radiculopathy, lumbar region: Secondary | ICD-10-CM | POA: Diagnosis not present

## 2023-08-08 DIAGNOSIS — M5459 Other low back pain: Secondary | ICD-10-CM | POA: Diagnosis not present

## 2023-08-11 ENCOUNTER — Other Ambulatory Visit: Payer: Self-pay | Admitting: Internal Medicine

## 2023-08-16 DIAGNOSIS — H353211 Exudative age-related macular degeneration, right eye, with active choroidal neovascularization: Secondary | ICD-10-CM | POA: Diagnosis not present

## 2023-08-18 DIAGNOSIS — M5416 Radiculopathy, lumbar region: Secondary | ICD-10-CM | POA: Diagnosis not present

## 2023-08-18 DIAGNOSIS — M5459 Other low back pain: Secondary | ICD-10-CM | POA: Diagnosis not present

## 2023-08-30 ENCOUNTER — Ambulatory Visit: Payer: PPO

## 2023-08-30 ENCOUNTER — Ambulatory Visit
Admission: EM | Admit: 2023-08-30 | Discharge: 2023-08-30 | Disposition: A | Discharge status: Home / Self Care | Payer: PPO | Attending: Internal Medicine | Admitting: Internal Medicine

## 2023-08-30 DIAGNOSIS — M25562 Pain in left knee: Secondary | ICD-10-CM

## 2023-08-30 DIAGNOSIS — M79652 Pain in left thigh: Secondary | ICD-10-CM | POA: Diagnosis not present

## 2023-08-30 DIAGNOSIS — M1612 Unilateral primary osteoarthritis, left hip: Secondary | ICD-10-CM | POA: Diagnosis not present

## 2023-08-30 MED ORDER — PREDNISONE 10 MG PO TABS: 30.0000 mg | ORAL_TABLET | Freq: Every day | ORAL | 0 refills | Status: DC

## 2023-08-30 MED ORDER — METHOCARBAMOL 500 MG PO TABS: 500.0000 mg | ORAL_TABLET | Freq: Two times a day (BID) | ORAL | 0 refills | Status: DC

## 2023-08-30 NOTE — ED Provider Notes (Signed)
[facility]  Note:  This document was prepared using Conservation officer, historic buildings and may include unintentional dictation errors.  MRN: [id number] DOB: [birth year]  Subjective:   Scott Oneal is a 82 y.o. male presenting for 8-day history of acute onset persistent left thigh, left knee pain.  Patient reports that he awoke with the symptoms.  Denies any particular fall, trauma, injury.  Denies doing a lot of walking, stair climbing, lifting.  Denies history of a thigh injury, knee injury.  Does not have an orthopedist.  Patient does have a history of a DVT, takes Eliquis.  No current facility-administered medications for this encounter.  Current Outpatient Medications:    aspirin EC 81 MG tablet, Take 1 tablet (81 mg total) by mouth 2 (two) times daily., Disp: 100 tablet, Rfl: 3   atorvastatin (LIPITOR) 40 MG tablet, Take 1 tablet (40 mg total) by mouth daily., Disp: 90 tablet, Rfl: 3   calcitRIOL (ROCALTROL) 0.25 MCG capsule, TAKE 1 CAPSULE BY MOUTH EVERY DAY, Disp: 90 capsule, Rfl: 1   carvedilol (COREG) 3.125 MG tablet, Take 1 tablet (3.125 mg total) by mouth 2 (two) times daily., Disp: 180 tablet, Rfl: 3   cetirizine (ZYRTEC) 10 MG tablet, Take 10 mg by mouth daily as needed for allergies., Disp: , Rfl:    Cholecalciferol 2000 UNITS TABS, Take 2,000 Units by mouth daily., Disp: , Rfl:    clonazePAM (KLONOPIN) 0.5 MG tablet, Take 1 tablet (0.5 mg total) by mouth 2 (two) times daily as needed for anxiety., Disp: 60 tablet, Rfl: 3   diclofenac sodium (VOLTAREN) 1 % GEL, APPLY 4 G TOPICALLY 4 TIMES DAILY., Disp: 200 g, Rfl: 5   ELIQUIS 5 MG TABS tablet, Take 5 mg by mouth 2 (two) times daily., Disp: , Rfl:    furosemide (LASIX) 40 MG tablet, Take 40 mg by mouth daily., Disp: , Rfl:    hydrALAZINE (APRESOLINE) 10 MG tablet, Take 1-2 tablet 3 times a day as needed if blood pressure is >170, Disp: 60 tablet, Rfl: 3   ipratropium (ATROVENT) 0.03 % nasal spray, PLACE  2 SPRAYS INTO BOTH NOSTRILS 3 (THREE) TIMES DAILY. USE BEFORE MEALS, Disp: 30 mL, Rfl: 5   ketoconazole (NIZORAL) 2 % cream, Apply 1 Application topically 2 (two) times daily. Foot rash, Disp: 45 g, Rfl: 1   levothyroxine (SYNTHROID) 175 MCG tablet, TAKE 1 TABLET BY MOUTH EVERY DAY, Disp: 90 tablet, Rfl: 3   lipase/protease/amylase (CREON) 36000 UNITS CPEP capsule, TAKE 2 CAPSULES BY MOUTH 3 TIMES A DAY WITH MEALS, MAY TAKE 1 CAPSULE WITH SNACKS AS NEEDED, Disp: 300 capsule, Rfl: 5   Multiple Vitamins-Minerals (PRESERVISION AREDS 2) CAPS, Take 1 capsule by mouth in the morning and at bedtime., Disp: , Rfl:    nitroGLYCERIN (NITROSTAT) 0.4 MG SL tablet, Place 0.4 mg under the tongue every 5 (five) minutes as needed for chest pain., Disp: , Rfl:    oxyCODONE-acetaminophen (PERCOCET) 10-325 MG tablet, Take 0.5-1 tablets by mouth every 6 (six) hours as needed., Disp: 120 tablet, Rfl: 0   pantoprazole (PROTONIX) 40 MG tablet, TAKE 1 TABLET BY MOUTH TWICE A DAY, Disp: 180 tablet, Rfl: 1   predniSONE (DELTASONE) 10 MG tablet, Take 30 mg by mouth every morning., Disp: , Rfl:    Propylene Glycol (SYSTANE BALANCE) 0.6 % SOLN, Place 1 drop into both eyes daily as needed (Dry eye)., Disp: , Rfl:    SYRINGE-NEEDLE, DISP, 3 ML (BD ECLIPSE SYRINGE) 23G  X 1" 3 ML MISC, As directed IM, Disp: 50 each, Rfl: 2   tamsulosin (FLOMAX) 0.4 MG CAPS capsule, Take 0.4 mg by mouth daily., Disp: , Rfl:    testosterone cypionate (DEPOTESTOSTERONE CYPIONATE) 200 MG/ML injection, INJECT INTO MUSCLE EVERY 2 WEEKS, Disp: 12 mL, Rfl: 1   Allergies  Allergen Reactions   Biaxin [Clarithromycin]     Dizziness   Tizanidine     Felt funny   Allantoin-Pramoxine Other (See Comments)    Pt does't remember reaction   Amlodipine Swelling    Edema    Amoxicillin Rash    Rash on arms that developed towards end of 7 day treatment   Benzalkonium Chloride Itching and Rash   Doxycycline Photosensitivity and Rash   Metoprolol Tartrate  Other (See Comments)    REACTION: fatigue    Past Medical History:  Diagnosis Date   Allergy    Arthritis    Calculus of gallbladder without mention of cholecystitis    Cataract    bilateral cateracts removed   Celiac disease    Chronic kidney disease    stage 3   Coronary atherosclerosis of unspecified type of vessel, native or graft    Dermatitis 06/08/2021   Erythema of lower extremity 09/08/2021   Esophageal reflux    Glaucoma    History of cellulitis 09/08/2021   History of kidney stones    Loss of weight    Lower extremity weakness 05/31/2021   Macular degeneration    Rt eye . injections   Old myocardial infarction    Osteoporosis, unspecified    Other malaise and fatigue    Other specified cardiac dysrhythmias(427.89)    Other testicular hypofunction    Pancreatic insufficiency 05/31/2021   Personal history of colonic polyps    Postural dizziness with presyncope 12/13/2021   Pure hypercholesterolemia    Thrombocytopenia (HCC)    chronic, per PCP notes dating back to 2009   Tinea pedis 05/31/2021   Unspecified asthma(493.90)    Unspecified essential hypertension    Unspecified hypothyroidism    Venous stasis dermatitis 09/08/2021     Past Surgical History:  Procedure Laterality Date   ARTERY BIOPSY Left 02/18/2022   Procedure: LEFT TEMPORAL ARTERY BIOPSY;  Surgeon: Sheliah Hatch, De Blanch, MD;  Location: [facility];  Service: General;  Laterality: Left;   cataract surgery Bilateral    COLONOSCOPY  06/26/2017   Russella Dar   COLONOSCOPY WITH PROPOFOL N/A 11/27/2014   Procedure: COLONOSCOPY WITH PROPOFOL;  Surgeon: Rachael Fee, MD;  Location: [facility];  Service: Endoscopy;  Laterality: N/A;   CORONARY ANGIOPLASTY     CORONARY STENT PLACEMENT  2011   Cypher; in distal circumflex artery   CYSTOSCOPY  1998   IR URETERAL STENT LEFT NEW ACCESS W/O SEP NEPHROSTOMY CATH  06/21/2021   KNEE SURGERY     right   LEFT HEART CATH AND CORONARY ANGIOGRAPHY N/A 01/31/2020    Procedure: LEFT HEART CATH AND CORONARY ANGIOGRAPHY;  Surgeon: Lyn Records, MD;  Location: [facility] CV [facility];  Service: Cardiovascular;  Laterality: N/A;   NEPHROLITHOTOMY Left 06/21/2021   Procedure: NEPHROLITHOTOMY PERCUTANEOUS;  Surgeon: Marcine Matar, MD;  Location: [facility];  Service: Urology;  Laterality: Left;  90 MINS   PERIPHERAL VASCULAR THROMBECTOMY Left 06/24/2022   Procedure: PERIPHERAL VASCULAR THROMBECTOMY;  Surgeon: Leonie Douglas, MD;  Location: [facility];  Service: Cardiovascular;  Laterality: Left;   SHOULDER SURGERY Left 07/2013   Dr Colvin Caroli  UPPER GASTROINTESTINAL ENDOSCOPY      Family History  Problem Relation Age of Onset   Lymphoma Father    Hypertension Other    Vision loss Maternal Grandmother    Colon cancer Son 100   Asthma Neg Hx    Esophageal cancer Neg Hx    Rectal cancer Neg Hx    Stomach cancer Neg Hx    Colon polyps Neg Hx     Social History   Tobacco Use   Smoking status: Never    Passive exposure: Never   Smokeless tobacco: Never  Vaping Use   Vaping status: Never Used  Substance Use Topics   Alcohol use: No   Drug use: No    ROS   Objective:   Vitals: BP 137/85 (BP Location: Right Arm)   Pulse 78   Temp 98.2 F (36.8 C) (Oral)   Resp 16   SpO2 98%   Physical Exam Constitutional:      General: He is not in acute distress.    Appearance: Normal appearance. He is well-developed and normal weight. He is not ill-appearing, toxic-appearing or diaphoretic.  HENT:     Head: Normocephalic and atraumatic.     Right Ear: External ear normal.     Left Ear: External ear normal.     Nose: Nose normal.     Mouth/Throat:     Pharynx: Oropharynx is clear.  Eyes:     General: No scleral icterus.       Right eye: No discharge.        Left eye: No discharge.     Extraocular Movements: Extraocular movements intact.  Cardiovascular:     Rate and Rhythm: Normal rate.  Pulmonary:     Effort: Pulmonary effort is normal.   Musculoskeletal:     Cervical back: Normal range of motion.     Left upper leg: Swelling (1+ over area outlined) and tenderness (superficial) present. No edema, deformity, lacerations or bony tenderness.       Legs:     Comments: No warmth, erythema, open wounds.  Neurological:     Mental Status: He is alert and oriented to person, place, and time.  Psychiatric:        Mood and Affect: Mood normal.        Behavior: Behavior normal.        Thought Content: Thought content normal.        Judgment: Judgment normal.     Assessment and Plan :   PDMP not reviewed this encounter.  1. Acute pain of left knee   2. Left thigh pain    X-ray over-read was pending at time of discharge, recommended follow up with only abnormal results. Otherwise will not call for negative over-read. Patient was in agreement.  Suspect quadriceps tendinitis, quadriceps injury.  Recommended prednisone for anti-inflammatory and pain relief properties.  Patient has previously done well with methocarbamol.  Advised against narcotic pain medication.  Follow-up with an orthopedist.  Counseled patient on potential for adverse effects with medications prescribed/recommended today, ER and return-to-clinic precautions discussed, patient verbalized understanding.    Wallis Bamberg, [place] 08/30/23 [id number]

## 2023-08-30 NOTE — Discharge Instructions (Signed)
I will call you with your x-ray report later today. For now, start prednisone for the next 5 days. This is a good anti-inflammatory and pain medication. Use methocarbamol as a muscle relaxant. If it makes you too sleepy then take it at bedtime.

## 2023-08-30 NOTE — ED Triage Notes (Signed)
Pt c/o pain to LLE thigh to groin x 8 days-denies injury-ambulating with own cane due to pain/not baseline-slow gait

## 2023-09-05 ENCOUNTER — Ambulatory Visit: Payer: PPO | Admitting: Internal Medicine

## 2023-09-05 ENCOUNTER — Encounter: Payer: Self-pay | Admitting: Internal Medicine

## 2023-09-05 VITALS — BP 92/58 | HR 86 | Temp 98.3°F | Ht 72.0 in | Wt 161.0 lb

## 2023-09-05 DIAGNOSIS — N320 Bladder-neck obstruction: Secondary | ICD-10-CM | POA: Diagnosis not present

## 2023-09-05 DIAGNOSIS — R197 Diarrhea, unspecified: Secondary | ICD-10-CM | POA: Diagnosis not present

## 2023-09-05 DIAGNOSIS — M792 Neuralgia and neuritis, unspecified: Secondary | ICD-10-CM

## 2023-09-05 DIAGNOSIS — N1832 Chronic kidney disease, stage 3b: Secondary | ICD-10-CM

## 2023-09-05 DIAGNOSIS — K8689 Other specified diseases of pancreas: Secondary | ICD-10-CM

## 2023-09-05 DIAGNOSIS — K219 Gastro-esophageal reflux disease without esophagitis: Secondary | ICD-10-CM

## 2023-09-05 DIAGNOSIS — K900 Celiac disease: Secondary | ICD-10-CM

## 2023-09-05 DIAGNOSIS — M5386 Other specified dorsopathies, lumbar region: Secondary | ICD-10-CM | POA: Diagnosis not present

## 2023-09-05 MED ORDER — METHOCARBAMOL 500 MG PO TABS: 500.0000 mg | ORAL_TABLET | Freq: Three times a day (TID) | ORAL | 0 refills | Status: DC | PRN

## 2023-09-05 MED ORDER — PREDNISONE 10 MG PO TABS: ORAL_TABLET | ORAL | 1 refills | Status: DC

## 2023-09-05 MED ORDER — TRAMADOL HCL 50 MG PO TABS
50.0000 mg | ORAL_TABLET | Freq: Four times a day (QID) | ORAL | 3 refills | Status: DC | PRN
Start: 2023-09-05 — End: 2024-01-10

## 2023-09-05 MED ORDER — PANCRELIPASE (LIP-PROT-AMYL) 36000-114000 UNITS PO CPEP: 36000.0000 [IU] | ORAL_CAPSULE | Freq: Three times a day (TID) | ORAL | 5 refills | Status: AC

## 2023-09-05 NOTE — Assessment & Plan Note (Signed)
Off Tamsulosin Cialis daily was offered

## 2023-09-05 NOTE — Assessment & Plan Note (Signed)
Monitor GFR 

## 2023-09-05 NOTE — Assessment & Plan Note (Signed)
A flare-up Tramadol prn  Potential benefits of a long term opioids use as well as potential risks (i.e. addiction risk, apnea etc) and complications (i.e. Somnolence, constipation and others) were explained to the patient and were aknowledged. Robaxin prn Prednisone taper prn F/u w/Dr Shon Baton

## 2023-09-05 NOTE — Assessment & Plan Note (Signed)
  Tramadol prn  Potential benefits of a long term opioids use as well as potential risks (i.e. addiction risk, apnea etc) and complications (i.e. Somnolence, constipation and others) were explained to the patient and were aknowledged. Robaxin prn Prednisone taper prn F/u w/Dr Shon Baton

## 2023-09-05 NOTE — Progress Notes (Signed)
Subjective:  Patient ID: Scott Oneal, male    DOB: 15-Mar-1941  Age: 82 y.o. MRN: [id number]  CC: Medical Management of Chronic Issues (6 week f/u, discuss low BP. Discuss issue with numbness in lt foot and lt shin discoloration.)   HPI Scott Oneal presents for the issue with numbness in lt foot and lt shin discoloration. F/u on HTN, celiac disease  Outpatient Medications Prior to Visit  Medication Sig Dispense Refill   aspirin EC 81 MG tablet Take 1 tablet (81 mg total) by mouth 2 (two) times daily. 100 tablet 3   atorvastatin (LIPITOR) 40 MG tablet Take 1 tablet (40 mg total) by mouth daily. 90 tablet 3   carvedilol (COREG) 3.125 MG tablet Take 1 tablet (3.125 mg total) by mouth 2 (two) times daily. 180 tablet 3   cetirizine (ZYRTEC) 10 MG tablet Take 10 mg by mouth daily as needed for allergies.     Cholecalciferol 2000 UNITS TABS Take 2,000 Units by mouth daily.     clonazePAM (KLONOPIN) 0.5 MG tablet Take 1 tablet (0.5 mg total) by mouth 2 (two) times daily as needed for anxiety. 60 tablet 3   diclofenac sodium (VOLTAREN) 1 % GEL APPLY 4 G TOPICALLY 4 TIMES DAILY. 200 g 5   furosemide (LASIX) 40 MG tablet Take 40 mg by mouth daily.     hydrALAZINE (APRESOLINE) 10 MG tablet Take 1-2 tablet 3 times a day as needed if blood pressure is >170 60 tablet 3   ipratropium (ATROVENT) 0.03 % nasal spray PLACE 2 SPRAYS INTO BOTH NOSTRILS 3 (THREE) TIMES DAILY. USE BEFORE MEALS 30 mL 5   ketoconazole (NIZORAL) 2 % cream Apply 1 Application topically 2 (two) times daily. Foot rash 45 g 1   levothyroxine (SYNTHROID) 175 MCG tablet TAKE 1 TABLET BY MOUTH EVERY DAY 90 tablet 3   Multiple Vitamins-Minerals (PRESERVISION AREDS 2) CAPS Take 1 capsule by mouth in the morning and at bedtime.     nitroGLYCERIN (NITROSTAT) 0.4 MG SL tablet Place 0.4 mg under the tongue every 5 (five) minutes as needed for chest pain.     pantoprazole (PROTONIX) 40 MG tablet TAKE 1 TABLET BY MOUTH TWICE A DAY 180 tablet  1   Propylene Glycol (SYSTANE BALANCE) 0.6 % SOLN Place 1 drop into both eyes daily as needed (Dry eye).     SYRINGE-NEEDLE, DISP, 3 ML (BD ECLIPSE SYRINGE) 23G X 1" 3 ML MISC As directed IM 50 each 2   tamsulosin (FLOMAX) 0.4 MG CAPS capsule Take 0.4 mg by mouth daily.     testosterone cypionate (DEPOTESTOSTERONE CYPIONATE) 200 MG/ML injection INJECT INTO MUSCLE EVERY 2 WEEKS 12 mL 1   calcitRIOL (ROCALTROL) 0.25 MCG capsule TAKE 1 CAPSULE BY MOUTH EVERY DAY 90 capsule 1   ELIQUIS 5 MG TABS tablet Take 5 mg by mouth 2 (two) times daily.     lipase/protease/amylase (CREON) 36000 UNITS CPEP capsule TAKE 2 CAPSULES BY MOUTH 3 TIMES A DAY WITH MEALS, MAY TAKE 1 CAPSULE WITH SNACKS AS NEEDED 300 capsule 5   methocarbamol (ROBAXIN) 500 MG tablet Take 1 tablet (500 mg total) by mouth 2 (two) times daily. 20 tablet 0   oxyCODONE-acetaminophen (PERCOCET) 10-325 MG tablet Take 0.5-1 tablets by mouth every 6 (six) hours as needed. 120 tablet 0   predniSONE (DELTASONE) 10 MG tablet Take 3 tablets (30 mg total) by mouth daily with breakfast. 15 tablet 0   No facility-administered medications prior to visit.  ROS: Review of Systems  Constitutional:  Positive for fatigue. Negative for appetite change and unexpected weight change.  HENT:  Negative for congestion, nosebleeds, sneezing, sore throat and trouble swallowing.   Eyes:  Negative for itching and visual disturbance.  Respiratory:  Negative for cough.   Cardiovascular:  Positive for leg swelling. Negative for chest pain and palpitations.  Gastrointestinal:  Negative for abdominal distention, blood in stool, diarrhea and nausea.  Genitourinary:  Negative for frequency and hematuria.  Musculoskeletal:  Positive for arthralgias, back pain and gait problem. Negative for joint swelling and neck pain.  Skin:  Positive for color change. Negative for rash.  Neurological:  Positive for weakness. Negative for dizziness, tremors and speech difficulty.   Hematological:  Bruises/bleeds easily.  Psychiatric/Behavioral:  Positive for dysphoric mood and sleep disturbance. Negative for agitation.     Objective:  BP (!) 92/58 (BP Location: Right Arm, Patient Position: Sitting, Cuff Size: Normal)   Pulse 86   Temp 98.3 F (36.8 C) (Oral)   Ht 6' (1.829 m)   Wt 161 lb (73 kg)   SpO2 97%   BMI 21.84 kg/m   BP Readings from Last 3 Encounters:  09/05/23 (!) 92/58  08/30/23 137/85  07/25/23 105/60    Wt Readings from Last 3 Encounters:  09/05/23 161 lb (73 kg)  07/25/23 177 lb (80.3 kg)  07/24/23 177 lb 3.2 oz (80.4 kg)    Physical Exam Constitutional:      General: He is not in acute distress.    Appearance: Normal appearance. He is well-developed.     Comments: NAD  Eyes:     Conjunctiva/sclera: Conjunctivae normal.     Pupils: Pupils are equal, round, and reactive to light.  Neck:     Thyroid: No thyromegaly.     Vascular: No JVD.  Cardiovascular:     Rate and Rhythm: Normal rate and regular rhythm.     Heart sounds: Normal heart sounds. No murmur heard.    No friction rub. No gallop.  Pulmonary:     Effort: Pulmonary effort is normal. No respiratory distress.     Breath sounds: Normal breath sounds. No wheezing or rales.  Chest:     Chest wall: No tenderness.  Abdominal:     General: Bowel sounds are normal. There is no distension.     Palpations: Abdomen is soft. There is no mass.     Tenderness: There is no abdominal tenderness. There is no guarding or rebound.  Musculoskeletal:        General: Tenderness present. Normal range of motion.     Cervical back: Normal range of motion.  Lymphadenopathy:     Cervical: No cervical adenopathy.  Skin:    General: Skin is warm and dry.     Findings: No rash.  Neurological:     Mental Status: He is alert and oriented to person, place, and time.     Cranial Nerves: No cranial nerve deficit.     Motor: No abnormal muscle tone.     Coordination: Coordination normal.      Gait: Gait abnormal.     Deep Tendon Reflexes: Reflexes are normal and symmetric.  Psychiatric:        Behavior: Behavior normal.        Thought Content: Thought content normal.        Judgment: Judgment normal.    LLE w/pain, LS w/pain Using a cane   A total time of 45 minutes was spent preparing  to see the patient, reviewing tests, x-rays, operative reports and other medical records.  Also, obtaining history and performing comprehensive physical exam.  Additionally, counseling the patient regarding the above listed issues.   Finally, documenting clinical information in the health records, coordination of care, educating the patient - celiac, chronic pain. It is a complex case.   Lab Results  Component Value Date   WBC 6.8 07/24/2023   HGB 10.9 (L) 07/24/2023   HCT 35.8 (L) 07/24/2023   PLT 156 07/24/2023   GLUCOSE 108 (H) 07/24/2023   CHOL 143 06/25/2022   TRIG 88 06/25/2022   HDL 42 06/25/2022   LDLCALC 83 06/25/2022   ALT 26 07/24/2023   AST 31 07/24/2023   NA 143 07/24/2023   K 3.9 07/24/2023   CL 100 07/24/2023   CREATININE 1.74 (H) 07/24/2023   BUN 29 (H) 07/24/2023   CO2 31 (H) 07/24/2023   TSH 2.89 04/06/2023   PSA 2.58 07/05/2022   INR 1.3 (H) 06/23/2022   HGBA1C 6.4 04/06/2023   MICROALBUR 1.2 05/18/2017    DG Femur 1V Left  Result Date: 08/30/2023 CLINICAL DATA:  Left thigh and inguinal pain for 8 days EXAM: LEFT FEMUR 1 VIEW COMPARISON:  None Available. FINDINGS: Two frontal views of the left femur are obtained. No evidence of acute fracture, subluxation, or dislocation on this single projection. Mild osteoarthritis of the left hip and medial compartment of the left knee. Diffuse atherosclerosis. Soft tissues are otherwise unremarkable. IMPRESSION: 1. No acute bony abnormality. 2. Mild osteoarthritis of the left hip and knee. 3. Diffuse atherosclerosis. Electronically Signed   By: Sharlet Salina M.D.   On: 08/30/2023 15:05    Assessment & Plan:   Problem  List Items Addressed This Visit     GERD (gastroesophageal reflux disease)   Relevant Medications   lipase/protease/amylase (CREON) 36000 UNITS CPEP capsule   Celiac disease - Primary    On Gluten free diet      Diarrhea    Resolved on Creon      CKD (chronic kidney disease) stage 3, GFR 30-59 ml/min (HCC)    Monitor GFR      Sciatica associated with disorder of lumbar spine    A flare-up Tramadol prn  Potential benefits of a long term opioids use as well as potential risks (i.e. addiction risk, apnea etc) and complications (i.e. Somnolence, constipation and others) were explained to the patient and were aknowledged. Robaxin prn Prednisone taper prn F/u w/Dr Shon Baton      Relevant Medications   methocarbamol (ROBAXIN) 500 MG tablet   Pancreatic insufficiency   Relevant Medications   lipase/protease/amylase (CREON) 36000 UNITS CPEP capsule   Neuropathic pain     Tramadol prn  Potential benefits of a long term opioids use as well as potential risks (i.e. addiction risk, apnea etc) and complications (i.e. Somnolence, constipation and others) were explained to the patient and were aknowledged. Robaxin prn Prednisone taper prn F/u w/Dr Shon Baton      Bladder neck obstruction    Off Tamsulosin Cialis daily was offered           Meds ordered this encounter  Medications   methocarbamol (ROBAXIN) 500 MG tablet    Sig: Take 1 tablet (500 mg total) by mouth every 8 (eight) hours as needed for muscle spasms.    Dispense:  90 tablet    Refill:  0   traMADol (ULTRAM) 50 MG tablet    Sig: Take 1 tablet (50 mg  total) by mouth every 6 (six) hours as needed for severe pain (pain score 7-10).    Dispense:  120 tablet    Refill:  3   predniSONE (DELTASONE) 10 MG tablet    Sig: Prednisone 10 mg: take 4 tabs a day x 3 days; then 3 tabs a day x 4 days; then 2 tabs a day x 4 days, then 1 tab a day x 6 days, then stop. Take pc.    Dispense:  38 tablet    Refill:  1    lipase/protease/amylase (CREON) 36000 UNITS CPEP capsule    Sig: Take 1-2 capsules (36,000-72,000 Units total) by mouth 3 (three) times daily before meals.    Dispense:  300 capsule    Refill:  5      Follow-up: Return in about 4 weeks (around 10/03/2023) for a follow-up visit.  Sonda Primes, MD

## 2023-09-05 NOTE — Assessment & Plan Note (Signed)
On Gluten free diet 

## 2023-09-05 NOTE — Assessment & Plan Note (Signed)
Resolved on Creon

## 2023-09-11 ENCOUNTER — Other Ambulatory Visit: Payer: Self-pay | Admitting: Internal Medicine

## 2023-09-12 DIAGNOSIS — I129 Hypertensive chronic kidney disease with stage 1 through stage 4 chronic kidney disease, or unspecified chronic kidney disease: Secondary | ICD-10-CM | POA: Diagnosis not present

## 2023-09-12 DIAGNOSIS — R319 Hematuria, unspecified: Secondary | ICD-10-CM | POA: Diagnosis not present

## 2023-09-12 DIAGNOSIS — Z6841 Body Mass Index (BMI) 40.0 and over, adult: Secondary | ICD-10-CM | POA: Diagnosis not present

## 2023-09-12 DIAGNOSIS — N183 Chronic kidney disease, stage 3 unspecified: Secondary | ICD-10-CM | POA: Diagnosis not present

## 2023-09-12 DIAGNOSIS — N320 Bladder-neck obstruction: Secondary | ICD-10-CM | POA: Diagnosis not present

## 2023-09-13 DIAGNOSIS — H353211 Exudative age-related macular degeneration, right eye, with active choroidal neovascularization: Secondary | ICD-10-CM | POA: Diagnosis not present

## 2023-09-25 ENCOUNTER — Other Ambulatory Visit: Payer: Self-pay | Admitting: Internal Medicine

## 2023-10-01 ENCOUNTER — Encounter: Payer: Self-pay | Admitting: Internal Medicine

## 2023-10-03 ENCOUNTER — Encounter: Payer: Self-pay | Admitting: Internal Medicine

## 2023-10-03 ENCOUNTER — Ambulatory Visit: Payer: PPO | Admitting: Internal Medicine

## 2023-10-03 VITALS — BP 122/72 | HR 85 | Temp 98.6°F | Ht 72.0 in | Wt 175.0 lb

## 2023-10-03 DIAGNOSIS — E034 Atrophy of thyroid (acquired): Secondary | ICD-10-CM | POA: Diagnosis not present

## 2023-10-03 DIAGNOSIS — E291 Testicular hypofunction: Secondary | ICD-10-CM | POA: Diagnosis not present

## 2023-10-03 DIAGNOSIS — I10 Essential (primary) hypertension: Secondary | ICD-10-CM

## 2023-10-03 DIAGNOSIS — R1310 Dysphagia, unspecified: Secondary | ICD-10-CM | POA: Diagnosis not present

## 2023-10-03 DIAGNOSIS — R609 Edema, unspecified: Secondary | ICD-10-CM

## 2023-10-03 DIAGNOSIS — M25562 Pain in left knee: Secondary | ICD-10-CM | POA: Diagnosis not present

## 2023-10-03 DIAGNOSIS — F411 Generalized anxiety disorder: Secondary | ICD-10-CM | POA: Diagnosis not present

## 2023-10-03 DIAGNOSIS — R634 Abnormal weight loss: Secondary | ICD-10-CM

## 2023-10-03 DIAGNOSIS — R200 Anesthesia of skin: Secondary | ICD-10-CM

## 2023-10-03 DIAGNOSIS — K900 Celiac disease: Secondary | ICD-10-CM | POA: Diagnosis not present

## 2023-10-03 DIAGNOSIS — G8929 Other chronic pain: Secondary | ICD-10-CM

## 2023-10-03 DIAGNOSIS — G5692 Unspecified mononeuropathy of left upper limb: Secondary | ICD-10-CM | POA: Diagnosis not present

## 2023-10-03 MED ORDER — GABAPENTIN 100 MG PO CAPS
100.0000 mg | ORAL_CAPSULE | Freq: Every day | ORAL | 1 refills | Status: DC
Start: 2023-10-03 — End: 2023-11-24

## 2023-10-03 MED ORDER — "SYRINGE 23G X 1"" 3 ML MISC": 3 refills | Status: AC

## 2023-10-03 MED ORDER — TESTOSTERONE CYPIONATE 200 MG/ML IM SOLN: INTRAMUSCULAR | 3 refills | Status: DC

## 2023-10-03 NOTE — Assessment & Plan Note (Signed)
L>>R due to OA Injection offered (on Eliquis) - higher risk

## 2023-10-03 NOTE — Assessment & Plan Note (Signed)
Will try Gabapentin again 100-200 mg at hs

## 2023-10-03 NOTE — Assessment & Plan Note (Addendum)
New Last EGD w.gastritis 2022 (Dr Russella Dar); GI ref offered for another EGD On Pantoprazole 40 mg bid

## 2023-10-03 NOTE — Assessment & Plan Note (Signed)
Cont on Lexapro °

## 2023-10-03 NOTE — Progress Notes (Signed)
Subjective:  Patient ID: Scott Oneal, male    DOB: 08-26-1941  Age: 82 y.o. MRN: [id number]  CC: Medical Management of Chronic Issues (4 week follow up )   HPI Scott Oneal presents for OA, LBP, celiac disease, HTN C/o L knee pain, wt loss C/o inability to swallow at times  Outpatient Medications Prior to Visit  Medication Sig Dispense Refill   aspirin EC 81 MG tablet Take 1 tablet (81 mg total) by mouth 2 (two) times daily. 100 tablet 3   atorvastatin (LIPITOR) 40 MG tablet Take 1 tablet (40 mg total) by mouth daily. 90 tablet 3   calcitRIOL (ROCALTROL) 0.25 MCG capsule TAKE 1 CAPSULE BY MOUTH EVERY DAY 90 capsule 1   carvedilol (COREG) 3.125 MG tablet Take 1 tablet (3.125 mg total) by mouth 2 (two) times daily. 180 tablet 3   cetirizine (ZYRTEC) 10 MG tablet Take 10 mg by mouth daily as needed for allergies.     Cholecalciferol 2000 UNITS TABS Take 2,000 Units by mouth daily.     clonazePAM (KLONOPIN) 0.5 MG tablet Take 1 tablet (0.5 mg total) by mouth 2 (two) times daily as needed for anxiety. 60 tablet 3   diclofenac sodium (VOLTAREN) 1 % GEL APPLY 4 G TOPICALLY 4 TIMES DAILY. 200 g 5   ELIQUIS 5 MG TABS tablet TAKE 1 TABLET (5 MG TOTAL) BY MOUTH 2 (TWO) TIMES DAILY FOR 7 DAYS. 60 tablet 5   furosemide (LASIX) 40 MG tablet Take 40 mg by mouth daily.     hydrALAZINE (APRESOLINE) 10 MG tablet Take 1-2 tablet 3 times a day as needed if blood pressure is >170 60 tablet 3   ipratropium (ATROVENT) 0.03 % nasal spray PLACE 2 SPRAYS INTO BOTH NOSTRILS 3 (THREE) TIMES DAILY. USE BEFORE MEALS 30 mL 5   ketoconazole (NIZORAL) 2 % cream Apply 1 Application topically 2 (two) times daily. Foot rash 45 g 1   levothyroxine (SYNTHROID) 175 MCG tablet TAKE 1 TABLET BY MOUTH EVERY DAY 90 tablet 3   lipase/protease/amylase (CREON) 36000 UNITS CPEP capsule Take 1-2 capsules (36,000-72,000 Units total) by mouth 3 (three) times daily before meals. 300 capsule 5   methocarbamol (ROBAXIN) 500 MG tablet  Take 1 tablet (500 mg total) by mouth every 8 (eight) hours as needed for muscle spasms. 90 tablet 0   Multiple Vitamins-Minerals (PRESERVISION AREDS 2) CAPS Take 1 capsule by mouth in the morning and at bedtime.     nitroGLYCERIN (NITROSTAT) 0.4 MG SL tablet Place 0.4 mg under the tongue every 5 (five) minutes as needed for chest pain.     pantoprazole (PROTONIX) 40 MG tablet TAKE 1 TABLET BY MOUTH TWICE A DAY 180 tablet 1   predniSONE (DELTASONE) 10 MG tablet Prednisone 10 mg: take 4 tabs a day x 3 days; then 3 tabs a day x 4 days; then 2 tabs a day x 4 days, then 1 tab a day x 6 days, then stop. Take pc. 38 tablet 1   Propylene Glycol (SYSTANE BALANCE) 0.6 % SOLN Place 1 drop into both eyes daily as needed (Dry eye).     tamsulosin (FLOMAX) 0.4 MG CAPS capsule Take 0.4 mg by mouth daily.     traMADol (ULTRAM) 50 MG tablet Take 1 tablet (50 mg total) by mouth every 6 (six) hours as needed for severe pain (pain score 7-10). 120 tablet 3   SYRINGE-NEEDLE, DISP, 3 ML (BD ECLIPSE SYRINGE) 23G X 1" 3 ML MISC As  directed IM 50 each 2   testosterone cypionate (DEPOTESTOSTERONE CYPIONATE) 200 MG/ML injection INJECT INTO MUSCLE EVERY 2 WEEKS 12 mL 1   No facility-administered medications prior to visit.    ROS: Review of Systems  Constitutional:  Positive for fatigue. Negative for appetite change and unexpected weight change.  HENT:  Negative for congestion, nosebleeds, sneezing, sore throat and trouble swallowing.   Eyes:  Negative for itching and visual disturbance.  Respiratory:  Negative for cough.   Cardiovascular:  Negative for chest pain, palpitations and leg swelling.  Gastrointestinal:  Negative for abdominal distention, blood in stool, diarrhea and nausea.  Genitourinary:  Negative for frequency and hematuria.  Musculoskeletal:  Positive for arthralgias, back pain and gait problem. Negative for joint swelling and neck pain.  Skin:  Negative for rash.  Neurological:  Positive for  dizziness, tremors and numbness. Negative for speech difficulty and weakness.  Psychiatric/Behavioral:  Positive for sleep disturbance. Negative for agitation and dysphoric mood. The patient is not nervous/anxious.     Objective:  BP 122/72 (BP Location: Right Arm, Patient Position: Sitting, Cuff Size: Normal)   Pulse 85   Temp 98.6 F (37 C) (Oral)   Ht 6' (1.829 m)   Wt 175 lb (79.4 kg)   SpO2 98%   BMI 23.73 kg/m   BP Readings from Last 3 Encounters:  10/03/23 122/72  09/05/23 (!) 92/58  08/30/23 137/85    Wt Readings from Last 3 Encounters:  10/03/23 175 lb (79.4 kg)  09/05/23 161 lb (73 kg)  07/25/23 177 lb (80.3 kg)    Physical Exam Constitutional:      General: He is not in acute distress.    Appearance: He is well-developed. He is obese.     Comments: NAD  Eyes:     Conjunctiva/sclera: Conjunctivae normal.     Pupils: Pupils are equal, round, and reactive to light.  Neck:     Thyroid: No thyromegaly.     Vascular: No JVD.  Cardiovascular:     Rate and Rhythm: Normal rate and regular rhythm.     Heart sounds: Normal heart sounds. No murmur heard.    No friction rub. No gallop.  Pulmonary:     Effort: Pulmonary effort is normal. No respiratory distress.     Breath sounds: Normal breath sounds. No wheezing or rales.  Chest:     Chest wall: No tenderness.  Abdominal:     General: Bowel sounds are normal. There is no distension.     Palpations: Abdomen is soft. There is no mass.     Tenderness: There is no abdominal tenderness. There is no guarding or rebound.  Musculoskeletal:        General: Tenderness present. Normal range of motion.     Cervical back: Normal range of motion.     Right lower leg: No edema.     Left lower leg: No edema.  Lymphadenopathy:     Cervical: No cervical adenopathy.  Skin:    General: Skin is warm and dry.     Findings: No rash.  Neurological:     Mental Status: He is alert and oriented to person, place, and time.      Cranial Nerves: No cranial nerve deficit.     Motor: No abnormal muscle tone.     Coordination: Coordination normal.     Gait: Gait normal.     Deep Tendon Reflexes: Reflexes are normal and symmetric.  Psychiatric:  Behavior: Behavior normal.        Thought Content: Thought content normal.        Judgment: Judgment normal.     Lab Results  Component Value Date   WBC 6.8 07/24/2023   HGB 10.9 (L) 07/24/2023   HCT 35.8 (L) 07/24/2023   PLT 156 07/24/2023   GLUCOSE 108 (H) 07/24/2023   CHOL 143 06/25/2022   TRIG 88 06/25/2022   HDL 42 06/25/2022   LDLCALC 83 06/25/2022   ALT 26 07/24/2023   AST 31 07/24/2023   NA 143 07/24/2023   K 3.9 07/24/2023   CL 100 07/24/2023   CREATININE 1.74 (H) 07/24/2023   BUN 29 (H) 07/24/2023   CO2 31 (H) 07/24/2023   TSH 2.89 04/06/2023   PSA 2.58 07/05/2022   INR 1.3 (H) 06/23/2022   HGBA1C 6.4 04/06/2023   MICROALBUR 1.2 05/18/2017    DG Femur 1V Left  Result Date: 08/30/2023 CLINICAL DATA:  Left thigh and inguinal pain for 8 days EXAM: LEFT FEMUR 1 VIEW COMPARISON:  None Available. FINDINGS: Two frontal views of the left femur are obtained. No evidence of acute fracture, subluxation, or dislocation on this single projection. Mild osteoarthritis of the left hip and medial compartment of the left knee. Diffuse atherosclerosis. Soft tissues are otherwise unremarkable. IMPRESSION: 1. No acute bony abnormality. 2. Mild osteoarthritis of the left hip and knee. 3. Diffuse atherosclerosis. Electronically Signed   By: Sharlet Salina M.D.   On: 08/30/2023 15:05    Assessment & Plan:   Problem List Items Addressed This Visit     Hypothyroidism    Chronic Cont on Levothroid      Hypogonadism in male    Chronic On Testosterone IM - pt wants to continue; it helps w/energy      Essential hypertension     BP Readings from Last 3 Encounters:  10/03/23 122/72  09/05/23 (!) 92/58  08/30/23 137/85   Continue w/Coreg, Hydralazine,  Benicar       Celiac disease - Primary    On Gluten free diet      WEIGHT LOSS    Wt Readings from Last 3 Encounters:  10/03/23 175 lb (79.4 kg)  09/05/23 161 lb (73 kg)  07/25/23 177 lb (80.3 kg)  better       Numbness    Will try Gabapentin again 100-200 mg at hs      Neuropathy of hand    Gabapentin      Relevant Medications   gabapentin (NEURONTIN) 100 MG capsule   Anxiety disorder    Cont on Lexapro      Edema    Better      Dysphagia    New Last EGD w.gastritis 2022 (Dr Russella Dar); GI ref offered for another EGD On Pantoprazole 40 mg bid      Knee pain, chronic    L>>R due to OA Injection offered (on Eliquis) - higher risk      Relevant Medications   gabapentin (NEURONTIN) 100 MG capsule      Meds ordered this encounter  Medications   gabapentin (NEURONTIN) 100 MG capsule    Sig: Take 1-2 capsules (100-200 mg total) by mouth at bedtime.    Dispense:  180 capsule    Refill:  1   Syringe/Needle, Disp, (SYRINGE 3CC/23GX1") 23G X 1" 3 ML MISC    Sig: Use for IM inj q 2 wks    Dispense:  50 each    Refill:  3   testosterone cypionate (DEPOTESTOSTERONE CYPIONATE) 200 MG/ML injection    Sig: INJECT INTO MUSCLE EVERY 2 WEEKS    Dispense:  3 mL    Refill:  3      Follow-up: No follow-ups on file.  Sonda Primes, MD

## 2023-10-03 NOTE — Assessment & Plan Note (Signed)
  BP Readings from Last 3 Encounters:  10/03/23 122/72  09/05/23 (!) 92/58  08/30/23 137/85   Continue w/Coreg, Hydralazine, Benicar

## 2023-10-03 NOTE — Assessment & Plan Note (Signed)
Chronic On Testosterone IM - pt wants to continue; it helps w/energy

## 2023-10-03 NOTE — Assessment & Plan Note (Signed)
Chronic Cont on Levothroid

## 2023-10-03 NOTE — Assessment & Plan Note (Signed)
Wt Readings from Last 3 Encounters:  10/03/23 175 lb (79.4 kg)  09/05/23 161 lb (73 kg)  07/25/23 177 lb (80.3 kg)  better

## 2023-10-03 NOTE — Assessment & Plan Note (Signed)
On Gluten free diet 

## 2023-10-03 NOTE — Assessment & Plan Note (Signed)
Gabapentin

## 2023-10-03 NOTE — Assessment & Plan Note (Signed)
Better  

## 2023-10-06 NOTE — Progress Notes (Signed)
Fax received from [facility] on 10/04/23 for medical clearance/medication hold for lumbar selective nerve root block injection to be signed by T. Hetty Blend.  Provider signed on 10/06/23, scanned into pt's chart, media routed via MyChart to sender on 10/06/23.

## 2023-10-09 ENCOUNTER — Encounter: Payer: Self-pay | Admitting: Internal Medicine

## 2023-10-09 DIAGNOSIS — M19072 Primary osteoarthritis, left ankle and foot: Secondary | ICD-10-CM | POA: Diagnosis not present

## 2023-10-12 ENCOUNTER — Other Ambulatory Visit: Payer: PPO

## 2023-10-12 ENCOUNTER — Ambulatory Visit: Payer: PPO | Admitting: Gastroenterology

## 2023-10-13 ENCOUNTER — Ambulatory Visit: Payer: PPO | Admitting: Physician Assistant

## 2023-10-13 DIAGNOSIS — H353211 Exudative age-related macular degeneration, right eye, with active choroidal neovascularization: Secondary | ICD-10-CM | POA: Diagnosis not present

## 2023-10-16 DIAGNOSIS — M19072 Primary osteoarthritis, left ankle and foot: Secondary | ICD-10-CM | POA: Diagnosis not present

## 2023-10-19 ENCOUNTER — Other Ambulatory Visit: Payer: Self-pay | Admitting: Internal Medicine

## 2023-10-19 DIAGNOSIS — M5416 Radiculopathy, lumbar region: Secondary | ICD-10-CM | POA: Diagnosis not present

## 2023-10-23 ENCOUNTER — Telehealth: Payer: Self-pay | Admitting: Radiology

## 2023-10-23 NOTE — Telephone Encounter (Signed)
Copied from CRM [id number]. Topic: General - Other >> Oct 23, 2023  1:46 PM Alvino Blood C wrote: Reason for CRM: Insurance company is calling to get the Pt's health records and also prior auth info for the following medication: testosterone cypionate (DEPOTESTOSTERONE CYPIONATE) 200 MG/ML injection. Caller has asked that the info be faxed to [phone]

## 2023-10-27 ENCOUNTER — Telehealth: Payer: Self-pay

## 2023-10-27 ENCOUNTER — Other Ambulatory Visit (HOSPITAL_COMMUNITY): Payer: Self-pay

## 2023-10-27 NOTE — Telephone Encounter (Signed)
Pharmacy Patient Advocate Encounter   Received notification from Pt Calls Messages that prior authorization for Testosterone 200mg /ml is required/requested.   Insurance verification completed.   The patient is insured through [facility] ADVANTAGE/RX ADVANCE .   Per test claim: PA required; PA started via CoverMyMeds. KEY [id number] . Waiting for clinical questions to populate.

## 2023-10-27 NOTE — Telephone Encounter (Signed)
Pharmacy Patient Advocate Encounter  Received notification from [facility] ADVANTAGE/RX ADVANCE that Prior Authorization for TESTOSTERONE CYPIONATE 200MG  has been DENIED.  Full denial letter will be uploaded to the media tab. See denial reason below.

## 2023-10-29 ENCOUNTER — Encounter: Payer: Self-pay | Admitting: Internal Medicine

## 2023-10-30 NOTE — Telephone Encounter (Signed)
 Information has been sent to clinical pharmacist for appeals review. It may take 5-7 days to prepare the necessary documentation to request the appeal from the insurance.

## 2023-10-30 NOTE — Telephone Encounter (Signed)
Pharmacy Patient Advocate Encounter  Received notification from [facility] ADVANTAGE/RX ADVANCE that Prior Authorization for Testosterone has been DENIED.  Full denial letter will be uploaded to the media tab. See denial reason below.

## 2023-10-30 NOTE — Telephone Encounter (Signed)
Appeal has been submitted for testosterone cypionate. Will advise when response is received. Please be advised that most companies may take 30 days to make a decision. Appeal letter and pertinent documentation has been faxed to [phone] on 10/30/2023 at 2:54 pm.  Thank you, Dellie Burns, PharmD Clinical Pharmacist  [place]  Direct Dial: [phone]

## 2023-11-02 ENCOUNTER — Other Ambulatory Visit (HOSPITAL_COMMUNITY): Payer: Self-pay

## 2023-11-02 NOTE — Telephone Encounter (Signed)
 Pharmacy Patient Advocate Encounter  Received notification from [facility] ADVANTAGE/RX ADVANCE that Prior Authorization for Testosterone  200mg /ml has been APPROVED from 11/01/2023 to 10/30/2024. Ran test claim, Copay is $5.00. This test claim was processed through [facility]- copay amounts may vary at other pharmacies due to pharmacy/plan contracts, or as the patient moves through the different stages of their insurance plan.   PA #/Case ID/Reference #: [id number]

## 2023-11-03 ENCOUNTER — Ambulatory Visit: Payer: PPO | Admitting: Podiatry

## 2023-11-03 ENCOUNTER — Encounter: Payer: Self-pay | Admitting: Podiatry

## 2023-11-03 DIAGNOSIS — B353 Tinea pedis: Secondary | ICD-10-CM

## 2023-11-03 DIAGNOSIS — L97421 Non-pressure chronic ulcer of left heel and midfoot limited to breakdown of skin: Secondary | ICD-10-CM | POA: Diagnosis not present

## 2023-11-03 DIAGNOSIS — L97411 Non-pressure chronic ulcer of right heel and midfoot limited to breakdown of skin: Secondary | ICD-10-CM

## 2023-11-03 MED ORDER — GENTAMICIN SULFATE 0.1 % EX CREA: 1.0000 | TOPICAL_CREAM | Freq: Three times a day (TID) | CUTANEOUS | 0 refills | Status: DC

## 2023-11-03 MED ORDER — CLOTRIMAZOLE-BETAMETHASONE 1-0.05 % EX CREA: 1.0000 | TOPICAL_CREAM | Freq: Every day | CUTANEOUS | 0 refills | Status: DC

## 2023-11-03 MED ORDER — CLINDAMYCIN HCL 300 MG PO CAPS: 300.0000 mg | ORAL_CAPSULE | Freq: Three times a day (TID) | ORAL | 0 refills | Status: DC

## 2023-11-05 NOTE — Telephone Encounter (Signed)
 Noted! Thank you

## 2023-11-06 ENCOUNTER — Telehealth: Payer: Self-pay

## 2023-11-06 NOTE — Telephone Encounter (Signed)
 Patient called wanting to know which medication he is suppose to use on his heels and which one for his toes.

## 2023-11-06 NOTE — Telephone Encounter (Signed)
 Pt called back as he had not heard back yet and I did apologize but did explain the provider is in clinic and we worked thru lunch but the message was sent and as soon as we hear back we will let pt know.

## 2023-11-06 NOTE — Telephone Encounter (Signed)
Attempted to call back. Left VM to call back.

## 2023-11-07 NOTE — Telephone Encounter (Signed)
 I spoke with patient and explained to use the gentamicin cream on his heel and the clotrimazole cream goes on his toes per Dr. Ardelle Anton. Patient verbalized his understanding.

## 2023-11-07 NOTE — Telephone Encounter (Signed)
 Please send a new rx for the testosterone to [facility] as pt was approved for this medication.

## 2023-11-07 NOTE — Progress Notes (Signed)
 Subjective:   Patient ID: Scott Oneal, male   DOB: 83 y.o.   MRN: [id number]   HPI Chief Complaint  Patient presents with   Foot Pain    RM#11 Bilateral heel discomfort when walking due to skin irritation now for several months.   83 year old male presents the office with above concerns.  Has been getting pain to his heels and he has noticed some irritation of the skin which have been ongoing for several months now.  He has noted some redness to the right heel.  He does not report any fevers or chills.  Has not had any recent treatment.   Review of Systems  All other systems reviewed and are negative.  Past Medical History:  Diagnosis Date   Allergy     Arthritis    Calculus of gallbladder without mention of cholecystitis    Cataract    bilateral cateracts removed   Celiac disease    Chronic kidney disease    stage 3   Coronary atherosclerosis of unspecified type of vessel, native or graft    Dermatitis 06/08/2021   Erythema of lower extremity 09/08/2021   Esophageal reflux    Glaucoma    History of cellulitis 09/08/2021   History of kidney stones    Loss of weight    Lower extremity weakness 05/31/2021   Macular degeneration    Rt eye . injections   Old myocardial infarction    Osteoporosis, unspecified    Other malaise and fatigue    Other specified cardiac dysrhythmias(427.89)    Other testicular hypofunction    Pancreatic insufficiency 05/31/2021   Personal history of colonic polyps    Postural dizziness with presyncope 12/13/2021   Pure hypercholesterolemia    Thrombocytopenia (HCC)    chronic, per PCP notes dating back to 2009   Tinea pedis 05/31/2021   Unspecified asthma(493.90)    Unspecified essential hypertension    Unspecified hypothyroidism    Venous stasis dermatitis 09/08/2021    Past Surgical History:  Procedure Laterality Date   ARTERY BIOPSY Left 02/18/2022   Procedure: LEFT TEMPORAL ARTERY BIOPSY;  Surgeon: Stevie Herlene Righter, MD;   Location: [facility];  Service: General;  Laterality: Left;   cataract surgery Bilateral    COLONOSCOPY  06/26/2017   Aneita   COLONOSCOPY WITH PROPOFOL  N/A 11/27/2014   Procedure: COLONOSCOPY WITH PROPOFOL ;  Surgeon: Toribio SHAUNNA Cedar, MD;  Location: [facility];  Service: Endoscopy;  Laterality: N/A;   CORONARY ANGIOPLASTY     CORONARY STENT PLACEMENT  2011   Cypher; in distal circumflex artery   CYSTOSCOPY  1998   IR URETERAL STENT LEFT NEW ACCESS W/O SEP NEPHROSTOMY CATH  06/21/2021   KNEE SURGERY     right   LEFT HEART CATH AND CORONARY ANGIOGRAPHY N/A 01/31/2020   Procedure: LEFT HEART CATH AND CORONARY ANGIOGRAPHY;  Surgeon: Claudene Victory LELON, MD;  Location: [facility];  Service: Cardiovascular;  Laterality: N/A;   NEPHROLITHOTOMY Left 06/21/2021   Procedure: NEPHROLITHOTOMY PERCUTANEOUS;  Surgeon: Matilda Senior, MD;  Location: [facility];  Service: Urology;  Laterality: Left;  90 MINS   PERIPHERAL VASCULAR THROMBECTOMY Left 06/24/2022   Procedure: PERIPHERAL VASCULAR THROMBECTOMY;  Surgeon: Magda Debby SAILOR, MD;  Location: [facility];  Service: Cardiovascular;  Laterality: Left;   SHOULDER SURGERY Left 07/2013   Dr Graylon   UPPER GASTROINTESTINAL ENDOSCOPY       Current Outpatient Medications:    aspirin  EC 81 MG tablet, Take 1 tablet (  81 mg total) by mouth 2 (two) times daily., Disp: 100 tablet, Rfl: 3   atorvastatin  (LIPITOR) 40 MG tablet, TAKE 1 TABLET BY MOUTH EVERY DAY, Disp: 90 tablet, Rfl: 3   calcitRIOL  (ROCALTROL ) 0.25 MCG capsule, TAKE 1 CAPSULE BY MOUTH EVERY DAY, Disp: 90 capsule, Rfl: 1   cetirizine (ZYRTEC) 10 MG tablet, Take 10 mg by mouth daily as needed for allergies., Disp: , Rfl:    Cholecalciferol  2000 UNITS TABS, Take 2,000 Units by mouth daily., Disp: , Rfl:    clonazePAM  (KLONOPIN ) 0.5 MG tablet, Take 1 tablet (0.5 mg total) by mouth 2 (two) times daily as needed for anxiety., Disp: 60 tablet, Rfl: 3   clotrimazole -betamethasone  (LOTRISONE ) cream,  Apply 1 Application topically daily., Disp: 30 g, Rfl: 0   diclofenac  sodium (VOLTAREN ) 1 % GEL, APPLY 4 G TOPICALLY 4 TIMES DAILY., Disp: 200 g, Rfl: 5   ELIQUIS  5 MG TABS tablet, TAKE 1 TABLET (5 MG TOTAL) BY MOUTH 2 (TWO) TIMES DAILY FOR 7 DAYS., Disp: 60 tablet, Rfl: 5   furosemide  (LASIX ) 40 MG tablet, Take 40 mg by mouth daily., Disp: , Rfl:    gabapentin  (NEURONTIN ) 100 MG capsule, Take 1-2 capsules (100-200 mg total) by mouth at bedtime., Disp: 180 capsule, Rfl: 1   gentamicin  cream (GARAMYCIN ) 0.1 %, Apply 1 Application topically 3 (three) times daily., Disp: 15 g, Rfl: 0   hydrALAZINE  (APRESOLINE ) 10 MG tablet, Take 1-2 tablet 3 times a day as needed if blood pressure is >170, Disp: 60 tablet, Rfl: 3   ipratropium (ATROVENT ) 0.03 % nasal spray, PLACE 2 SPRAYS INTO BOTH NOSTRILS 3 (THREE) TIMES DAILY. USE BEFORE MEALS, Disp: 30 mL, Rfl: 5   ketoconazole  (NIZORAL ) 2 % cream, Apply 1 Application topically 2 (two) times daily. Foot rash, Disp: 45 g, Rfl: 1   levothyroxine  (SYNTHROID ) 175 MCG tablet, TAKE 1 TABLET BY MOUTH EVERY DAY, Disp: 90 tablet, Rfl: 3   lipase/protease/amylase (CREON ) 36000 UNITS CPEP capsule, Take 1-2 capsules (36,000-72,000 Units total) by mouth 3 (three) times daily before meals., Disp: 300 capsule, Rfl: 5   methocarbamol  (ROBAXIN ) 500 MG tablet, Take 1 tablet (500 mg total) by mouth every 8 (eight) hours as needed for muscle spasms., Disp: 90 tablet, Rfl: 0   Multiple Vitamins-Minerals (PRESERVISION AREDS 2) CAPS, Take 1 capsule by mouth in the morning and at bedtime., Disp: , Rfl:    nitroGLYCERIN  (NITROSTAT ) 0.4 MG SL tablet, Place 0.4 mg under the tongue every 5 (five) minutes as needed for chest pain., Disp: , Rfl:    pantoprazole  (PROTONIX ) 40 MG tablet, TAKE 1 TABLET BY MOUTH TWICE A DAY, Disp: 180 tablet, Rfl: 1   predniSONE  (DELTASONE ) 10 MG tablet, Prednisone  10 mg: take 4 tabs a day x 3 days; then 3 tabs a day x 4 days; then 2 tabs a day x 4 days, then 1 tab a  day x 6 days, then stop. Take pc., Disp: 38 tablet, Rfl: 1   Propylene Glycol (SYSTANE BALANCE) 0.6 % SOLN, Place 1 drop into both eyes daily as needed (Dry eye)., Disp: , Rfl:    Syringe/Needle, Disp, (SYRINGE 3CC/23GX1) 23G X 1 3 ML MISC, Use for IM inj q 2 wks, Disp: 50 each, Rfl: 3   tamsulosin  (FLOMAX ) 0.4 MG CAPS capsule, Take 0.4 mg by mouth daily., Disp: , Rfl:    testosterone  cypionate (DEPOTESTOSTERONE CYPIONATE) 200 MG/ML injection, INJECT INTO MUSCLE EVERY 2 WEEKS, Disp: 3 mL, Rfl: 3   traMADol  (  ULTRAM ) 50 MG tablet, Take 1 tablet (50 mg total) by mouth every 6 (six) hours as needed for severe pain (pain score 7-10)., Disp: 120 tablet, Rfl: 3   carvedilol  (COREG ) 3.125 MG tablet, Take 1 tablet (3.125 mg total) by mouth 2 (two) times daily., Disp: 180 tablet, Rfl: 3   clindamycin  (CLEOCIN ) 300 MG capsule, Take 1 capsule (300 mg total) by mouth 3 (three) times daily., Disp: 21 capsule, Rfl: 0  Allergies  Allergen Reactions   Biaxin  [Clarithromycin ]     Dizziness   Tizanidine      Felt funny   Allantoin-Pramoxine Other (See Comments)    Pt does't remember reaction   Amlodipine  Swelling    Edema    Amoxicillin  Rash    Rash on arms that developed towards end of 7 day treatment   Benzalkonium Chloride Itching and Rash   Doxycycline  Photosensitivity and Rash   Metoprolol Tartrate Other (See Comments)    REACTION: fatigue          Objective:  Physical Exam  General: AAO x3, NAD  Dermatological: To the posterior aspect bilateral heels are skin fissures, open wounds present with the right side worse than left.  The right side there is some localized erythema without any ascending cellulitis.  There is no drainage or pus today.  There is no active bleeding.  There is no malodor.  Macerated tissue along the interspaces, mild tinea pedis  Vascular: Dorsalis Pedis artery and Posterior Tibial artery pedal pulses are 1/4 bilateral with immediate CRT.   Neruologic: Grossly  intact via light touch bilateral.  Musculoskeletal: Tenderness on the end of the skin fissures bilaterally.  Gait: Unassisted, Nonantalgic.       Assessment:   Bilateral heel ulcerations, tinea pedis/maceration     Plan:  -Treatment options discussed including all alternatives, risks, and complications -Etiology of symptoms were discussed -Given localized erythema, start antibiotics.  I prescribed clindamycin  given the localized erythema.  Gentamicin  ointment on the heel wounds. Offloading/floating the heels.  -He has an appointment scheduled with [facility] next week -Prescribed Lotrisone  for between the toes. -Monitor for any clinical signs or symptoms of infection and directed to call the office immediately should any occur or go to the ER.  Return in about 2 weeks (around 11/17/2023).  Donnice JONELLE Fees DPM

## 2023-11-08 ENCOUNTER — Ambulatory Visit (HOSPITAL_COMMUNITY)
Admission: RE | Admit: 2023-11-08 | Discharge: 2023-11-08 | Disposition: A | Discharge status: Home / Self Care | Payer: PPO | Source: Ambulatory Visit | Attending: Vascular Surgery | Admitting: Vascular Surgery

## 2023-11-08 ENCOUNTER — Ambulatory Visit: Payer: PPO | Admitting: Physician Assistant

## 2023-11-08 ENCOUNTER — Other Ambulatory Visit (HOSPITAL_COMMUNITY): Payer: Self-pay

## 2023-11-08 VITALS — BP 132/81 | HR 77 | Temp 98.0°F | Resp 20 | Ht 72.0 in | Wt 176.7 lb

## 2023-11-08 DIAGNOSIS — I82512 Chronic embolism and thrombosis of left femoral vein: Secondary | ICD-10-CM | POA: Insufficient documentation

## 2023-11-08 DIAGNOSIS — M79672 Pain in left foot: Secondary | ICD-10-CM | POA: Diagnosis not present

## 2023-11-08 DIAGNOSIS — I87009 Postthrombotic syndrome without complications of unspecified extremity: Secondary | ICD-10-CM

## 2023-11-08 NOTE — Progress Notes (Signed)
 Office Note     CC:  follow up Requesting Provider:  Plotnikov, Karlynn GAILS, MD  HPI: Scott Oneal is a 83 y.o. ([birth year]) male who presents for evaluation of left lower extremity edema.  He underwent mechanical thrombectomy of left iliac venous system by Dr. Magda in August 2023.  He continues to have some level of edema in his left leg however believes this is improved with light compression and elevation.  He would like to discontinue Eliquis  if possible.  He denies any venous ulcerations.  He was also recently seen by a foot and ankle specialist due to cracking of both heels as well as pain in the left toes.  He was given a topical cream as well as pads for his shoes.  He denies claudication and rest pain of bilateral lower extremities.  He is ambulatory with a cane.  He has no history of tobacco use.   Past Medical History:  Diagnosis Date   Allergy     Arthritis    Calculus of gallbladder without mention of cholecystitis    Cataract    bilateral cateracts removed   Celiac disease    Chronic kidney disease    stage 3   Coronary atherosclerosis of unspecified type of vessel, native or graft    Dermatitis 06/08/2021   Erythema of lower extremity 09/08/2021   Esophageal reflux    Glaucoma    History of cellulitis 09/08/2021   History of kidney stones    Loss of weight    Lower extremity weakness 05/31/2021   Macular degeneration    Rt eye . injections   Old myocardial infarction    Osteoporosis, unspecified    Other malaise and fatigue    Other specified cardiac dysrhythmias(427.89)    Other testicular hypofunction    Pancreatic insufficiency 05/31/2021   Personal history of colonic polyps    Postural dizziness with presyncope 12/13/2021   Pure hypercholesterolemia    Thrombocytopenia (HCC)    chronic, per PCP notes dating back to 2009   Tinea pedis 05/31/2021   Unspecified asthma(493.90)    Unspecified essential hypertension    Unspecified hypothyroidism     Venous stasis dermatitis 09/08/2021    Past Surgical History:  Procedure Laterality Date   ARTERY BIOPSY Left 02/18/2022   Procedure: LEFT TEMPORAL ARTERY BIOPSY;  Surgeon: Stevie Herlene Righter, MD;  Location: [facility];  Service: General;  Laterality: Left;   cataract surgery Bilateral    COLONOSCOPY  06/26/2017   Aneita   COLONOSCOPY WITH PROPOFOL  N/A 11/27/2014   Procedure: COLONOSCOPY WITH PROPOFOL ;  Surgeon: Toribio SHAUNNA Cedar, MD;  Location: [facility] END[facility]COPY;  Service: Endoscopy;  Laterality: N/A;   CORONARY ANGIOPLASTY     CORONARY STENT PLACEMENT  2011   Cypher; in distal circumflex artery   CYSTOSCOPY  1998   IR URETERAL STENT LEFT NEW ACCESS W/O SEP NEPHROSTOMY CATH  06/21/2021   KNEE SURGERY     right   LEFT HEART CATH AND CORONARY ANGIOGRAPHY N/A 01/31/2020   Procedure: LEFT HEART CATH AND CORONARY ANGIOGRAPHY;  Surgeon: Claudene Victory LELON, MD;  Location: [facility];  Service: Cardiovascular;  Laterality: N/A;   NEPHROLITHOTOMY Left 06/21/2021   Procedure: NEPHROLITHOTOMY PERCUTANEOUS;  Surgeon: Matilda Senior, MD;  Location: [facility];  Service: Urology;  Laterality: Left;  90 MINS   PERIPHERAL VASCULAR THROMBECTOMY Left 06/24/2022   Procedure: PERIPHERAL VASCULAR THROMBECTOMY;  Surgeon: Magda Debby SAILOR, MD;  Location: [facility];  Service: Cardiovascular;  Laterality:  Left;   SHOULDER SURGERY Left 07/2013   Dr Graylon   UPPER GASTROINTESTINAL ENDOSCOPY      Social History   Socioeconomic History   Marital status: Married    Spouse name: Nolberto Cheuvront   Number of children: 3   Years of education: Not on file   Highest education level: Not on file  Occupational History   Occupation: retired    Associate Professor: RETIRED    Comment: Engineering  Tobacco Use   Smoking status: Never    Passive exposure: Never   Smokeless tobacco: Never  Vaping Use   Vaping status: Never Used  Substance and Sexual Activity   Alcohol use: No   Drug use: No   Sexual activity: Yes   Other Topics Concern   Not on file  Social History Narrative   Retired.   Regular Exercise-Yes; yoga & silver sneakers   Daily Caffeine Use.            Social Drivers of Corporate Investment Banker Strain: Low Risk  (07/06/2022)   Overall Financial Resource Strain (CARDIA)    Difficulty of Paying Living Expenses: Not hard at all  Food Insecurity: No Food Insecurity (07/06/2022)   Hunger Vital Sign    Worried About Running Out of Food in the Last Year: Never true    Ran Out of Food in the Last Year: Never true  Transportation Needs: No Transportation Needs (07/06/2022)   PRAPARE - Administrator, Civil Service (Medical): No    Lack of Transportation (Non-Medical): No  Physical Activity: Inactive (05/16/2023)   Exercise Vital Sign    Days of Exercise per Week: 0 days    Minutes of Exercise per Session: 0 min  Stress: Stress Concern Present (05/16/2023)   [employer] of Occupational Health - Occupational Stress Questionnaire    Feeling of Stress : To some extent  Social Connections: Socially Integrated (07/06/2022)   Social Connection and Isolation Panel [NHANES]    Frequency of Communication with Friends and Family: More than three times a week    Frequency of Social Gatherings with Friends and Family: More than three times a week    Attends Religious Services: More than 4 times per year    Active Member of [employer] or Organizations: Yes    Attends Engineer, Structural: More than 4 times per year    Marital Status: Married  Catering Manager Violence: Not At Risk (07/06/2022)   Humiliation, Afraid, Rape, and Kick questionnaire    Fear of Current or Ex-Partner: No    Emotionally Abused: No    Physically Abused: No    Sexually Abused: No    Family History  Problem Relation Age of Onset   Lymphoma Father    Hypertension Other    Vision loss Maternal Grandmother    Colon cancer Son 58   Asthma Neg Hx    Esophageal cancer Neg Hx    Rectal cancer Neg Hx     Stomach cancer Neg Hx    Colon polyps Neg Hx     Current Outpatient Medications  Medication Sig Dispense Refill   aspirin  EC 81 MG tablet Take 1 tablet (81 mg total) by mouth 2 (two) times daily. 100 tablet 3   atorvastatin  (LIPITOR) 40 MG tablet TAKE 1 TABLET BY MOUTH EVERY DAY 90 tablet 3   calcitRIOL  (ROCALTROL ) 0.25 MCG capsule TAKE 1 CAPSULE BY MOUTH EVERY DAY 90 capsule 1   cetirizine (ZYRTEC) 10 MG tablet Take  10 mg by mouth daily as needed for allergies.     Cholecalciferol  2000 UNITS TABS Take 2,000 Units by mouth daily.     clindamycin  (CLEOCIN ) 300 MG capsule Take 1 capsule (300 mg total) by mouth 3 (three) times daily. 21 capsule 0   clonazePAM  (KLONOPIN ) 0.5 MG tablet Take 1 tablet (0.5 mg total) by mouth 2 (two) times daily as needed for anxiety. 60 tablet 3   clotrimazole -betamethasone  (LOTRISONE ) cream Apply 1 Application topically daily. 30 g 0   diclofenac  sodium (VOLTAREN ) 1 % GEL APPLY 4 G TOPICALLY 4 TIMES DAILY. 200 g 5   ELIQUIS  5 MG TABS tablet TAKE 1 TABLET (5 MG TOTAL) BY MOUTH 2 (TWO) TIMES DAILY FOR 7 DAYS. 60 tablet 5   furosemide  (LASIX ) 40 MG tablet Take 40 mg by mouth daily.     gabapentin  (NEURONTIN ) 100 MG capsule Take 1-2 capsules (100-200 mg total) by mouth at bedtime. 180 capsule 1   gentamicin  cream (GARAMYCIN ) 0.1 % Apply 1 Application topically 3 (three) times daily. 15 g 0   hydrALAZINE  (APRESOLINE ) 10 MG tablet Take 1-2 tablet 3 times a day as needed if blood pressure is >170 60 tablet 3   ipratropium (ATROVENT ) 0.03 % nasal spray PLACE 2 SPRAYS INTO BOTH NOSTRILS 3 (THREE) TIMES DAILY. USE BEFORE MEALS 30 mL 5   ketoconazole  (NIZORAL ) 2 % cream Apply 1 Application topically 2 (two) times daily. Foot rash 45 g 1   levothyroxine  (SYNTHROID ) 175 MCG tablet TAKE 1 TABLET BY MOUTH EVERY DAY 90 tablet 3   lipase/protease/amylase (CREON ) 36000 UNITS CPEP capsule Take 1-2 capsules (36,000-72,000 Units total) by mouth 3 (three) times daily before meals. 300  capsule 5   methocarbamol  (ROBAXIN ) 500 MG tablet Take 1 tablet (500 mg total) by mouth every 8 (eight) hours as needed for muscle spasms. 90 tablet 0   Multiple Vitamins-Minerals (PRESERVISION AREDS 2) CAPS Take 1 capsule by mouth in the morning and at bedtime.     nitroGLYCERIN  (NITROSTAT ) 0.4 MG SL tablet Place 0.4 mg under the tongue every 5 (five) minutes as needed for chest pain.     pantoprazole  (PROTONIX ) 40 MG tablet TAKE 1 TABLET BY MOUTH TWICE A DAY 180 tablet 1   predniSONE  (DELTASONE ) 10 MG tablet Prednisone  10 mg: take 4 tabs a day x 3 days; then 3 tabs a day x 4 days; then 2 tabs a day x 4 days, then 1 tab a day x 6 days, then stop. Take pc. 38 tablet 1   Propylene Glycol (SYSTANE BALANCE) 0.6 % SOLN Place 1 drop into both eyes daily as needed (Dry eye).     Syringe/Needle, Disp, (SYRINGE 3CC/23GX1) 23G X 1 3 ML MISC Use for IM inj q 2 wks 50 each 3   tamsulosin  (FLOMAX ) 0.4 MG CAPS capsule Take 0.4 mg by mouth daily.     testosterone  cypionate (DEPOTESTOSTERONE CYPIONATE) 200 MG/ML injection INJECT INTO MUSCLE EVERY 2 WEEKS 3 mL 3   traMADol  (ULTRAM ) 50 MG tablet Take 1 tablet (50 mg total) by mouth every 6 (six) hours as needed for severe pain (pain score 7-10). 120 tablet 3   carvedilol  (COREG ) 3.125 MG tablet Take 1 tablet (3.125 mg total) by mouth 2 (two) times daily. 180 tablet 3   No current facility-administered medications for this visit.    Allergies  Allergen Reactions   Biaxin  [Clarithromycin ]     Dizziness   Tizanidine      Felt funny  Allantoin-Pramoxine Other (See Comments)    Pt does't remember reaction   Amlodipine  Swelling    Edema    Amoxicillin  Rash    Rash on arms that developed towards end of 7 day treatment   Benzalkonium Chloride Itching and Rash   Doxycycline  Photosensitivity and Rash   Metoprolol Tartrate Other (See Comments)    REACTION: fatigue     REVIEW OF SYSTEMS:   [X]  denotes positive finding, [ ]  denotes negative  finding Cardiac  Comments:  Chest pain or chest pressure:    Shortness of breath upon exertion:    Short of breath when lying flat:    Irregular heart rhythm:        Vascular    Pain in calf, thigh, or hip brought on by ambulation:    Pain in feet at night that wakes you up from your sleep:     Blood clot in your veins:    Leg swelling:         Pulmonary    Oxygen  at home:    Productive cough:     Wheezing:         Neurologic    Sudden weakness in arms or legs:     Sudden numbness in arms or legs:     Sudden onset of difficulty speaking or slurred speech:    Temporary loss of vision in one eye:     Problems with dizziness:         Gastrointestinal    Blood in stool:     Vomited blood:         Genitourinary    Burning when urinating:     Blood in urine:        Psychiatric    Major depression:         Hematologic    Bleeding problems:    Problems with blood clotting too easily:        Skin    Rashes or ulcers:        Constitutional    Fever or chills:      PHYSICAL EXAMINATION:  Vitals:   11/08/23 1254  BP: 132/81  Pulse: 77  Resp: 20  Temp: 98 F (36.7 C)  TempSrc: Temporal  SpO2: 98%  Weight: 176 lb 11.2 oz (80.2 kg)  Height: 6' (1.829 m)    General:  WDWN in NAD; vital signs documented above Gait: Not observed HENT: WNL, normocephalic Pulmonary: normal non-labored breathing , without Rales, rhonchi,  wheezing Cardiac: regular HR Abdomen: soft, NT, no masses Skin: without rashes Vascular Exam/Pulses: Brisk left DP, PT, peroneal by Doppler Extremities: Bandages left in place on both heels; left lower extremity pitting edema to the midshin with hyperpigmentation; no venous ulcerations Musculoskeletal: no muscle wasting or atrophy  Neurologic: A&O X 3 Psychiatric:  The pt has Normal affect.   Non-Invasive Vascular Imaging:   Left lower extremity with nonocclusive chronic thrombus extending up to the femoral vein    ASSESSMENT/PLAN:: 83  y.o. male here for follow up for surveillance of left leg status post mechanical thrombectomy of the left lower extremity due to extensive DVT  Subjectively the left leg edema has improved since last office visit.  He does not have any venous ulcerations or pain related with swelling.  He would like to discontinue his Eliquis  which Dr. Eliza stated would be okay as long as his ultrasound remained stable and his edema improved.  Venous duplex demonstrates nonocclusive thrombus extending up to the femoral vein  which is stable from 6 months ago.  Okay to discontinue Eliquis  at this time.  I discussed with the patient that he would likely have some level of edema in the left lower extremity indefinitely.  He should manage this with compression and elevation and he demonstrated his understanding.  Patient has also recently developed cracking of both heels.  He was evaluated by a foot and ankle specialist to prescribe a topical medication as well as provided him with heel pads to use with shoes.  He will follow-up with his provider in another few weeks.  He does not have any history of claudication or rest pain.  He has dopplerable flow in the left DP, PT, and peroneal positions.  We will plan to repeat left lower extremity venous duplex in 1 year.  We will however have to further evaluate his arterial circulation if he is unable to heal his heel wounds.   Donnice Sender, PA-C [facility] [phone]  Clinic MD:   Sheree

## 2023-11-09 ENCOUNTER — Encounter (HOSPITAL_COMMUNITY): Payer: PPO

## 2023-11-09 ENCOUNTER — Ambulatory Visit: Payer: PPO

## 2023-11-10 ENCOUNTER — Other Ambulatory Visit (HOSPITAL_COMMUNITY): Payer: Self-pay

## 2023-11-20 ENCOUNTER — Other Ambulatory Visit: Payer: Self-pay

## 2023-11-20 ENCOUNTER — Encounter (HOSPITAL_COMMUNITY): Payer: Self-pay

## 2023-11-20 ENCOUNTER — Encounter: Payer: Self-pay | Admitting: Physician Assistant

## 2023-11-20 ENCOUNTER — Ambulatory Visit: Payer: PPO | Attending: Physician Assistant | Admitting: Physician Assistant

## 2023-11-20 ENCOUNTER — Ambulatory Visit: Payer: PPO | Admitting: Physician Assistant

## 2023-11-20 ENCOUNTER — Emergency Department (HOSPITAL_COMMUNITY)
Admission: EM | Admit: 2023-11-20 | Discharge: 2023-11-20 | Disposition: A | Discharge status: Home / Self Care | Payer: PPO | Attending: Emergency Medicine | Admitting: Emergency Medicine

## 2023-11-20 ENCOUNTER — Emergency Department (HOSPITAL_COMMUNITY): Payer: PPO

## 2023-11-20 VITALS — BP 107/74 | HR 198 | Ht 72.0 in | Wt 180.4 lb

## 2023-11-20 DIAGNOSIS — J811 Chronic pulmonary edema: Secondary | ICD-10-CM | POA: Diagnosis not present

## 2023-11-20 DIAGNOSIS — I10 Essential (primary) hypertension: Secondary | ICD-10-CM | POA: Diagnosis not present

## 2023-11-20 DIAGNOSIS — I5022 Chronic systolic (congestive) heart failure: Secondary | ICD-10-CM | POA: Diagnosis not present

## 2023-11-20 DIAGNOSIS — R000 Tachycardia, unspecified: Secondary | ICD-10-CM

## 2023-11-20 DIAGNOSIS — Z79899 Other long term (current) drug therapy: Secondary | ICD-10-CM | POA: Diagnosis not present

## 2023-11-20 DIAGNOSIS — I959 Hypotension, unspecified: Secondary | ICD-10-CM

## 2023-11-20 DIAGNOSIS — I471 Supraventricular tachycardia, unspecified: Secondary | ICD-10-CM | POA: Diagnosis not present

## 2023-11-20 DIAGNOSIS — I429 Cardiomyopathy, unspecified: Secondary | ICD-10-CM | POA: Diagnosis not present

## 2023-11-20 DIAGNOSIS — R748 Abnormal levels of other serum enzymes: Secondary | ICD-10-CM | POA: Diagnosis not present

## 2023-11-20 DIAGNOSIS — E782 Mixed hyperlipidemia: Secondary | ICD-10-CM

## 2023-11-20 DIAGNOSIS — N184 Chronic kidney disease, stage 4 (severe): Secondary | ICD-10-CM

## 2023-11-20 DIAGNOSIS — I2510 Atherosclerotic heart disease of native coronary artery without angina pectoris: Secondary | ICD-10-CM | POA: Diagnosis not present

## 2023-11-20 DIAGNOSIS — R0902 Hypoxemia: Secondary | ICD-10-CM | POA: Diagnosis not present

## 2023-11-20 LAB — CBC
HCT: 45.9 % (ref 39.0–52.0)
Hemoglobin: 13.9 g/dL (ref 13.0–17.0)
MCH: 27 pg (ref 26.0–34.0)
MCHC: 30.3 g/dL (ref 30.0–36.0)
MCV: 89.3 fL (ref 80.0–100.0)
Platelets: 83 10*3/uL — ABNORMAL LOW (ref 150–400)
RBC: 5.14 MIL/uL (ref 4.22–5.81)
RDW: 18.2 % — ABNORMAL HIGH (ref 11.5–15.5)
WBC: 10 10*3/uL (ref 4.0–10.5)
nRBC: 0.2 % (ref 0.0–0.2)

## 2023-11-20 LAB — BASIC METABOLIC PANEL
Anion gap: 12 (ref 5–15)
BUN: 68 mg/dL — ABNORMAL HIGH (ref 8–23)
CO2: 28 mmol/L (ref 22–32)
Calcium: 9.4 mg/dL (ref 8.9–10.3)
Chloride: 100 mmol/L (ref 98–111)
Creatinine, Ser: 2.58 mg/dL — ABNORMAL HIGH (ref 0.61–1.24)
GFR, Estimated: 24 mL/min — ABNORMAL LOW (ref >60)
Glucose, Bld: 90 mg/dL (ref 70–99)
Potassium: 4.3 mmol/L (ref 3.5–5.1)
Sodium: 140 mmol/L (ref 135–145)

## 2023-11-20 LAB — BRAIN NATRIURETIC PEPTIDE: B Natriuretic Peptide: 562 pg/mL — ABNORMAL HIGH (ref 0.0–100.0)

## 2023-11-20 LAB — MAGNESIUM: Magnesium: 2 mg/dL (ref 1.7–2.4)

## 2023-11-20 MED ORDER — CARVEDILOL 3.125 MG PO TABS: 6.2500 mg | ORAL_TABLET | Freq: Two times a day (BID) | ORAL | 0 refills | Status: DC

## 2023-11-20 MED ORDER — CARVEDILOL 3.125 MG PO TABS
6.2500 mg | ORAL_TABLET | Freq: Two times a day (BID) | ORAL | Status: DC
  Administered 2023-11-20: 6.25 mg via ORAL
  Filled 2023-11-20: qty 2

## 2023-11-20 NOTE — ED Notes (Signed)
Patient states he can walk to the heart clinic to his car after being discharged. Patient is Alert and oriented x4

## 2023-11-20 NOTE — ED Triage Notes (Signed)
Pt arrives via ems to the er from heart care for c/o svt. Hr was 198. Bp 90/60, 6mg  adenosine given. Hr now in afib around 95bpm, 140/80 last bp. No c/o at this time from the pt. 20g LAC, fluid given by ems

## 2023-11-20 NOTE — Discharge Instructions (Signed)
Your heart was beating faster than normal today.  This will need close follow-up with a cardiologist.  In the meantime, we will increase your Coreg dose to 6.25 mg twice daily.  We also increase your Lasix dose from 20 mg to 40 mg once daily (in the morning), for the next 7 days.  If you are having chest pain, chest discomfort, difficulty breathing, lightheadedness, or any other concerns, please call 911 or return to the ER.

## 2023-11-20 NOTE — ED Provider Notes (Signed)
[facility] AT [facility] Provider Note   CSN: [id number] Arrival date & time: 11/20/23  1530     History  Chief Complaint  Patient presents with   Supraventricular Tachycardia     Scott Oneal is a 83 y.o. male with a history of cardiomyopathy, coronary disease, presented to ED with concern for SVT.  Patient was noted to have a heart rate of close of 200 for EMS, blood pressure 90/60, was given 6 mg of adenosine, subsequently was felt to have a potential conversion to A-fib with a heart rate below 100, blood pressure improved to 140/80.  Patient reports he has felt completely asymptomatic the entire time.  He denies palpitations or shortness of breath.  He was given 500 cc of fluid by EMS prior to arrival.  HPI     Home Medications Prior to Admission medications   Medication Sig Start Date End Date Taking? Authorizing Provider  acetaminophen (TYLENOL) 650 MG CR tablet Take 650 mg by mouth in the morning.   Yes [provider]  atorvastatin (LIPITOR) 40 MG tablet TAKE 1 TABLET BY MOUTH EVERY DAY 10/19/23  Yes Plotnikov, Georgina Quint, MD  calcitRIOL (ROCALTROL) 0.25 MCG capsule TAKE 1 CAPSULE BY MOUTH EVERY DAY Patient taking differently: Take 0.25 mcg by mouth in the morning. 09/26/23  Yes Plotnikov, Georgina Quint, MD  Cholecalciferol (VITAMIN D3) 50 MCG (2000 UT) TABS Take 2,000 Units by mouth daily.   Yes [provider]  furosemide (LASIX) 40 MG tablet Take 20 mg by mouth at bedtime. 05/29/23  Yes [provider]  gabapentin (NEURONTIN) 100 MG capsule Take 1-2 capsules (100-200 mg total) by mouth at bedtime. Patient taking differently: Take 100 mg by mouth at bedtime. 10/03/23  Yes Plotnikov, Georgina Quint, MD  levothyroxine (SYNTHROID) 175 MCG tablet TAKE 1 TABLET BY MOUTH EVERY DAY Patient taking differently: Take 175 mcg by mouth daily before breakfast. 03/13/23  Yes Plotnikov, Georgina Quint, MD  lipase/protease/amylase (CREON) 36000  UNITS CPEP capsule Take 1-2 capsules (36,000-72,000 Units total) by mouth 3 (three) times daily before meals. Patient taking differently: Take 72,000 Units by mouth 3 (three) times daily before meals. 09/05/23  Yes Plotnikov, Georgina Quint, MD  Multiple Vitamins-Minerals (PRESERVISION AREDS 2) CAPS Take 1 capsule by mouth in the morning and at bedtime.   Yes [provider]  nitroGLYCERIN (NITROSTAT) 0.4 MG SL tablet Place 0.4 mg under the tongue every 5 (five) minutes as needed for chest pain.   Yes [provider]  pantoprazole (PROTONIX) 40 MG tablet Take 40 mg by mouth 2 (two) times daily before a meal.   Yes [provider]  Propylene Glycol (SYSTANE BALANCE) 0.6 % SOLN Place 1 drop into both eyes 3 (three) times daily as needed (for dryness).   Yes [provider]  testosterone cypionate (DEPOTESTOSTERONE CYPIONATE) 200 MG/ML injection INJECT INTO MUSCLE EVERY 2 WEEKS Patient taking differently: Inject 400 mg into the muscle every 14 (fourteen) days. 10/03/23  Yes Plotnikov, Georgina Quint, MD  traMADol (ULTRAM) 50 MG tablet Take 1 tablet (50 mg total) by mouth every 6 (six) hours as needed for severe pain (pain score 7-10). 09/05/23  Yes Plotnikov, Georgina Quint, MD  carvedilol (COREG) 3.125 MG tablet Take 2 tablets (6.25 mg total) by mouth 2 (two) times daily with a meal. 11/20/23 12/20/23  Treyton Slimp, Kermit Balo, MD  Syringe/Needle, Disp, (SYRINGE 3CC/23GX1") 23G X 1" 3 ML MISC Use for IM inj q 2 wks  10/03/23   Plotnikov, Georgina Quint, MD      Allergies    Biaxin [clarithromycin], Tizanidine, Allantoin-pramoxine, Amlodipine, Amoxicillin, Benzalkonium chloride, Doxycycline, and Metoprolol tartrate    Review of Systems   Review of Systems  Physical Exam Updated Vital Signs BP 131/74   Pulse 98   Temp 98.3 F (36.8 C) (Oral)   Resp 17   SpO2 98%  Physical Exam Constitutional:      General: He is not in acute distress. HENT:     Head: Normocephalic and atraumatic.   Eyes:     Conjunctiva/sclera: Conjunctivae normal.     Pupils: Pupils are equal, round, and reactive to light.  Cardiovascular:     Rate and Rhythm: Normal rate and regular rhythm.  Pulmonary:     Effort: Pulmonary effort is normal. No respiratory distress.  Abdominal:     General: There is no distension.     Tenderness: There is no abdominal tenderness.  Skin:    General: Skin is warm and dry.  Neurological:     General: No focal deficit present.     Mental Status: He is alert. Mental status is at baseline.  Psychiatric:        Mood and Affect: Mood normal.        Behavior: Behavior normal.     ED Results / Procedures / Treatments   Labs (all labs ordered are listed, but only abnormal results are displayed) Labs Reviewed  CBC - Abnormal; Notable for the following components:      Result Value   RDW 18.2 (*)    Platelets 83 (*)    All other components within normal limits  BRAIN NATRIURETIC PEPTIDE - Abnormal; Notable for the following components:   B Natriuretic Peptide 562.0 (*)    All other components within normal limits  BASIC METABOLIC PANEL - Abnormal; Notable for the following components:   BUN 68 (*)    Creatinine, Ser 2.58 (*)    GFR, Estimated 24 (*)    All other components within normal limits  MAGNESIUM    EKG None  Radiology DG Chest Portable 1 View Result Date: 11/20/2023 CLINICAL DATA:  Supraventricular tachycardia for evaluation of pulmonary edema EXAM: PORTABLE CHEST 1 VIEW COMPARISON:  Chest radiograph dated 07/24/2023 FINDINGS: Normal lung volumes. Mild prominence of bilateral perihilar pulmonary vasculature. Insert no focal no pleural effusion or pneumothorax. The heart size and mediastinal contours are within normal limits. No acute osseous abnormality. IMPRESSION: Mild prominence of bilateral perihilar pulmonary vasculature, which may represent mild vascular congestion. No frank pulmonary edema. Electronically Signed   By: Agustin Cree M.D.   On:  11/20/2023 16:39    Procedures Procedures    Medications Ordered in ED Medications  carvedilol (COREG) tablet 6.25 mg (6.25 mg Oral Given 11/20/23 1700)    ED Course/ Medical Decision Making/ A&P                                 Medical Decision Making Amount and/or Complexity of Data Reviewed Labs: ordered. Radiology: ordered.  Risk Prescription drug management.   Patient is here with concern for SVT rhythm, now appears to have converted into a sinus tachycardia with heart rate 100 to 120 bpm, with frequent PACs.  He is utterly asymptomatic of this.  No hypoxia.  Blood pressure is normal here.  I have a low suspicion for ACS or acute pulmonary embolism.  We will  check his electrolyte levels as well as a chest x-ray for underlying infection or pulmonary edema, and a BNP.  Supplemental history is provided by EMS.  I reviewed the patient's external records including his cardiology evaluation noting history of coronary disease status post inferior MI and multiple stenting, heart failure with an EF of 55 to 60% in 2024 echocardiogram, chronic kidney disease, history of DVT status post mechanical thrombectomy.  Patient reports he was taken off of Eliquis about 2 weeks ago for said DVT.    His cardiologist in September had to wean the patient's carvedilol from 25 mg down to 6.25 mg twice a day, now it was again cut down to 3.125 mg twice daily.  He was stopped on isosorbide.  I personally reviewed interpreted patient's labs and EKG here, notable for BNP 562, CKD with Cr 2.5 near baseline, Mag and K wnl, hgb wnl  X-ray imaging showing vascular congestion or pulmonary congestion.  I reviewed the patient's telemetry here in the ED.  It showed persistent sinus tachycardia with a heart rate of 100 to 120 bpm, with frequent PACs.  There was a brief recurrence of SVT lasting approximately 20 seconds and then resolving.  Patient was given oral Coreg at a higher dose here in the ED.   Discussed the case with the cardiology PA with some of the office today, and he agreed to increase his carvedilol dosing back to 6.25 mg twice daily.  It had been decreased over the past several months down to its lowest dose.  It was also clear from discussing with the patient that he was not persistently taking the Lasix as prescribed, due to urinating too often.  We will increase his dose back to 40 mg daily, in the morning.  He had only been using 20 mg sporadically at night.  Hospitalization was considered but the patient was adamant that he needs to go home, he needs to help take care of his wife.  Given that he is asymptomatic I think this is reasonable.  Strict return precautions were discussed.  The patient verbalized understanding.  At this time based on the current rhythm I do not see clear evidence of atrial fibrillation do not believe the patient needs to be restarted on anticoagulation.  No clear evidence of pulmonary embolism mother.  No indication for CT angiogram imaging.  The patient has outpatient follow-up already arranged         Final Clinical Impression(s) / ED Diagnoses Final diagnoses:  Tachycardia  SVT (supraventricular tachycardia) (HCC)    Rx / DC Orders ED Discharge Orders          Ordered    carvedilol (COREG) 3.125 MG tablet  2 times daily with meals        11/20/23 1904              Terald Sleeper, MD 11/20/23 1906

## 2023-11-20 NOTE — Patient Instructions (Signed)
PER RECOMMENDATIONS YOU NEED TO BE TRANSPORTED TO HOSPITAL BY EMS FOR FURTHER ANALYSIS

## 2023-11-20 NOTE — Progress Notes (Signed)
Cardiology Office Note:  .   Date:  11/20/2023  ID:  Rasha, Olivio 1941-02-07, MRN [id number] PCP: Tresa Garter, MD   HeartCare Providers Cardiologist:  Tonny Bollman, MD {    History of Present Illness: .   AYAAN FESSLER is a 83 y.o. male who is followed for hypertension/hypotension.  Medical history includes the following:  Coronary artery disease  S/p inferior MI in 2005 >> PCI: overlapping (33 x 30 mm, 8 x 30 mm) DES to RCA  Staged PCI: 2.5 x 8 mm DES to LCx Myoview 01/21/20: EF 47, no ischemia, intermediate risk (low EF)  Cath 01/31/20: LCx and RCA stents patent w mod (50) ISR, mod mid (65) and dist LAD (75) dz >> med rx HFmrEF (heart failure with mildly reduced ejection fraction)   TTE 01/28/2020: EF 45-50, global HK, mild LVH, Gr 2 DD, GLS -16%, normal RVSF, RVSP 40.3, trivial MR, AoV sclerosis (no AS), mild dilation of Asc Aorta (37 mm)  TTE 03/19/2021: EF 50-55 TTE 07/20/2023: EF 55-60, no RWMA, mild LVH, GR 1 DD, normal RVSF, mildly elevated PASP (RVSP 36.1), moderate LAE, trivial MR, AV sclerosis, RAP 8 Chronic kidney disease (eGFR 33) Carotid US 06/01/23: Bilateral ICA 1-39 ABIs 08/2016: Normal bilaterally Hx of DVT 05/2022 s/p mechanical thrombectomy of left common iliac vein, left external iliac vein, left common femoral vein, left femoral vein  Hypertension  Hyperlipidemia   Patient was last seen in September 2024 and at that time was experiencing some hypotension with blood pressures 98/52.  Carvedilol was decreased from 25 mg dose down to 6.25 mg twice a day.  Isosorbide was stopped.  Follow-up echo demonstrated normal EF and mild pulmonary hypertension.  Creatinine increased to 2.67.  Plan was to repeat labs when he returns for follow-up.   Of note, his alkaline phosphatase was elevated at 527.  Hemoglobin was low at 11.0.  Remains lightheadedness dizzy at times at his last office visit.  Mention a couple falls.  Sleeps on an incline and reports no  difficulty in this position.  Noted swelling in his legs which she manages with support hose.  Also having occasional shooting pains down his legs which she describes as brief and not clearly related to activity.  Noticed did not decrease in appetite and lost around 20 pounds over the past year.  Denied fevers, chills, coughing, wheezing, and GI symptoms.  Today, he presents with a history of coronary disease and blood clots  with a high heart rate (198 on pulse ox). The patient denies experiencing any symptoms such as chest pain, lightheadedness, dizziness, or fainting. The patient has coronary stents in place and has not experienced any chest pain. The patient was previously on Eliquis for a blood clot in the leg, but this medication was recently discontinued by his vascular team. The patient reports occasional leg swelling but denies any pain. The patient also wears a back brace for spinal issues and has been advised against surgery due to the risk of paralysis. The patient's medication regimen has been simplified, and he is currently taking Lipitor, carvedilol, Lasix, and nitroglycerin as needed. The patient has adjusted his Lasix dosage due to frequent urination, particularly at night.   Reports no shortness of breath nor dyspnea on exertion. Reports no chest pain, pressure, or tightness. No edema, orthopnea, PND. Reports no palpitations.   Discussed the use of AI scribe software for clinical note transcription with the patient, who gave verbal consent to proceed.  ROS: pertinent ROS in HPI  Studies Reviewed: Marland Kitchen   EKG Interpretation Date/Time:  Monday November 20 2023 14:32:02 EST Ventricular Rate:  223 PR Interval:    QRS Duration:  64 QT Interval:  194 QTC Calculation: 373 R Axis:   -4  Text Interpretation: Supraventricular tachycardia , d/w DOD Possible Inferior infarct , age undetermined Marked ST abnormality, possible lateral subendocardial injury When compared with ECG of 24-Jul-2023  10:48, Significant changes have occurred Confirmed by Jari Favre [phone]) on 11/20/2023 4:59:48 PM             Physical Exam:   VS:  BP 107/74   Pulse (!) 198   Ht 6' (1.829 m)   Wt 180 lb 6.4 oz (81.8 kg)   SpO2 94%   BMI 24.47 kg/m    Wt Readings from Last 3 Encounters:  11/20/23 180 lb 6.4 oz (81.8 kg)  11/08/23 176 lb 11.2 oz (80.2 kg)  10/03/23 175 lb (79.4 kg)    GEN: Well nourished, well developed in no acute distress NECK: No JVD; No carotid bruits CARDIAC: SVT, no murmurs, rubs, gallops RESPIRATORY:  Clear to auscultation without rales, wheezing or rhonchi  ABDOMEN: Soft, non-tender, non-distended EXTREMITIES:  No edema; No deformity   ASSESSMENT AND PLAN: .    Supraventricular Tachycardia (SVT) Asymptomatic with heart rate 223 bpm. EKG confirmed SVT. -Transfer to hospital for administration of adenosine in a controlled setting due to risk of significant heart rate drop. -d/w ER physician to increase coreg to 6.25 and have patient watch BP at home  Deep Vein Thrombosis (DVT) History of DVT with recent discontinuation of Eliquis by vascular team. No current leg pain or significant swelling. -No change in plan as per vascular team's recommendation.  Chronic Back Pain Utilizing back brace for pain management due to high surgical risk. -Continue current management with back brace.  Medication Management Self-adjustment of Lasix (Furosemide) dose due to nocturia. Currently taking half of 40mg  pill (20mg ) at night. -Advise to take Lasix (Furosemide) 20mg  in the morning to reduce nocturia.  Coronary Artery Disease (CAD) Asymptomatic with stents in place. -Continue current medications:Lipitor for cholesterol, Carvedilol for heart, and Nitroglycerin as needed.  Follow-up -Adjust Carvedilol dose as needed after hospital visit for SVT. -Monitor blood pressure after any medication changes.     Dispo: Close follow-up after ER visit.  Signed, Sharlene Dory, PA-C

## 2023-11-21 ENCOUNTER — Other Ambulatory Visit: Payer: Self-pay

## 2023-11-21 DIAGNOSIS — I82512 Chronic embolism and thrombosis of left femoral vein: Secondary | ICD-10-CM

## 2023-11-24 ENCOUNTER — Ambulatory Visit: Payer: PPO | Attending: Cardiovascular Disease | Admitting: Cardiovascular Disease

## 2023-11-24 ENCOUNTER — Ambulatory Visit (INDEPENDENT_AMBULATORY_CARE_PROVIDER_SITE_OTHER): Payer: PPO

## 2023-11-24 ENCOUNTER — Other Ambulatory Visit: Payer: Self-pay | Admitting: Cardiovascular Disease

## 2023-11-24 ENCOUNTER — Encounter: Payer: Self-pay | Admitting: Podiatry

## 2023-11-24 ENCOUNTER — Ambulatory Visit: Payer: PPO | Admitting: Podiatry

## 2023-11-24 ENCOUNTER — Encounter: Payer: Self-pay | Admitting: Cardiovascular Disease

## 2023-11-24 VITALS — BP 120/74 | HR 83 | Ht 72.0 in | Wt 178.6 lb

## 2023-11-24 DIAGNOSIS — I471 Supraventricular tachycardia, unspecified: Secondary | ICD-10-CM

## 2023-11-24 DIAGNOSIS — L97522 Non-pressure chronic ulcer of other part of left foot with fat layer exposed: Secondary | ICD-10-CM

## 2023-11-24 DIAGNOSIS — I2510 Atherosclerotic heart disease of native coronary artery without angina pectoris: Secondary | ICD-10-CM

## 2023-11-24 DIAGNOSIS — L84 Corns and callosities: Secondary | ICD-10-CM | POA: Diagnosis not present

## 2023-11-24 DIAGNOSIS — I10 Essential (primary) hypertension: Secondary | ICD-10-CM

## 2023-11-24 MED ORDER — CARVEDILOL 6.25 MG PO TABS: 6.2500 mg | ORAL_TABLET | Freq: Two times a day (BID) | ORAL | 3 refills | Status: AC

## 2023-11-24 MED ORDER — ASPIRIN 81 MG PO TBEC: 81.0000 mg | DELAYED_RELEASE_TABLET | Freq: Every day | ORAL | Status: AC

## 2023-11-24 NOTE — Patient Instructions (Signed)
Clean wound with saline.  Dry well.  Apply a small amount of Iodosorb to the wound followed by dressing.  Change daily.  I have ordered a circulation test to make sure you have enough blood flow to the foot. If you do not hear for them about scheduling within the next 1 week, or you have any questions please give [place] a call at [phone].

## 2023-11-24 NOTE — Assessment & Plan Note (Signed)
The patient is stable with no further swings in his blood pressures.  I am going to increase his beta-blocker to try to reduce his risk of recurrent SVT.  I think he can tolerate this okay and his carvedilol will be increased to 6.25 mg twice daily.  Blood pressure appears to be well-controlled.

## 2023-11-24 NOTE — Progress Notes (Unsigned)
Applied a 14 day Zio XT monitor to patient in the office ?

## 2023-11-24 NOTE — Patient Instructions (Addendum)
Medication Instructions:  Start Coreg 6.25 mg one tablet twice daily  Resume Aspirin 81 mg daily   *If you need a refill on your cardiac medications before your next appointment, please call your pharmacy*  Testing/Procedures: ZIO patch 14 day   Your physician has recommended that you wear an event monitor. Event monitors are medical devices that record the heart's electrical activity. Doctors most often [place] these monitors to diagnose arrhythmias. Arrhythmias are problems with the speed or rhythm of the heartbeat. The monitor is a small, portable device. You can wear one while you do your normal daily activities. This is usually used to diagnose what is causing palpitations/syncope (passing out).   Follow-Up: At [facility], you and your health needs are our priority.  As part of our continuing mission to provide you with exceptional heart care, we have created designated Provider Care Teams.  These Care Teams include your primary Cardiologist (physician) and Advanced Practice Providers (APPs -  Physician Assistants and Nurse Practitioners) who all work together to provide you with the care you need, when you need it.   Your next appointment:   3 month(s)  Provider:   Jari Favre, PA-C, Ronie Spies, PA-C, Robin Searing, NP, Jacolyn Reedy, PA-C, Eligha Bridegroom, NP, Tereso Newcomer, PA-C, or Perlie Gold, PA-C     Then, Tonny Bollman, MD will plan to see you again in 1 year(s).    Other Instructions  ZIO XT- Long Term Monitor Instructions     Your physician has requested you wear a ZIO patch monitor for 14 days.  This is a single patch monitor. [employer] supplies one patch monitor per enrollment. Additional  stickers are not available. Please do not apply patch if you will be having a Nuclear Stress Test,  Echocardiogram, Cardiac CT, MRI, or Chest Xray during the period you would be wearing the  monitor. The patch cannot be worn during these tests. You cannot remove and re-apply the   ZIO XT patch monitor.  Your ZIO patch monitor will be mailed 3 day USPS to your address on file. It may take 3-5 days  to receive your monitor after you have been enrolled.  Once you have received your monitor, please review the enclosed instructions. Your monitor  has already been registered assigning a specific monitor serial # to you.     Billing and Patient Assistance Program Information     We have supplied [employer] with any of your insurance information on file for billing purposes.  [employer] offers a sliding scale Patient Assistance Program for patients that do not have  insurance, or whose insurance does not completely cover the cost of the ZIO monitor.  You must apply for the Patient Assistance Program to qualify for this discounted rate.  To apply, please call [employer] at [phone], select option 4, select option 2, ask to apply for  Patient Assistance Program. Meredeth Ide will ask your household income, and how many people  are in your household. They will quote your out-of-pocket cost based on that information.  [employer] will also be able to set up a 64-month, interest-free payment plan if needed.     Applying the monitor     Shave hair from upper left chest.  Hold abrader disc by orange tab. Rub abrader in 40 strokes over the upper left chest as  indicated in your monitor instructions.  Clean area with 4 enclosed alcohol pads. Let dry.  Apply patch as indicated in monitor instructions. Patch will be placed under  collarbone on left  side of chest with arrow pointing upward.  Rub patch adhesive wings for 2 minutes. Remove white label marked "1". Remove the white  label marked "2". Rub patch adhesive wings for 2 additional minutes.  While looking in a mirror, press and release button in center of patch. A small green light will  flash 3-4 times. This will be your only indicator that the monitor has been turned on.  Do not shower for the first 24 hours. You may shower after the  first 24 hours.  Press the button if you feel a symptom. You will hear a small click. Record Date, Time and  Symptom in the Patient Logbook.  When you are ready to remove the patch, follow instructions on the last 2 pages of Patient  Logbook. Stick patch monitor onto the last page of Patient Logbook.  Place Patient Logbook in the blue and white box. Use locking tab on box and tape box closed  securely. The blue and white box has prepaid postage on it. Please place it in the mailbox as  soon as possible. Your physician should have your test results approximately 7 days after the  monitor has been mailed back to [facility].  Call [facility] Customer Care at [phone] if you have questions regarding  your ZIO XT patch monitor. Call them immediately if you see an orange light blinking on your  monitor.  If your monitor falls off in less than 4 days, contact our Monitor department at [phone].  If your monitor becomes loose or falls off after 4 days call [employer] at [phone] for  suggestions on securing your monitor.

## 2023-11-24 NOTE — Progress Notes (Signed)
Cardiology Office Note:    Date:  11/24/2023   ID:  Saeed, Toren 1940/11/20, MRN [id number]  PCP:  Tresa Garter, MD   [facility] Providers Cardiologist:  Tonny Bollman, MD     Referring MD: Tresa Garter, MD   Chief Complaint  Patient presents with   Coronary Artery Disease    History of Present Illness:    Scott Oneal is a 83 y.o. male with a hx of:  Coronary artery disease  S/p inferior MI in 2005 >> PCI: overlapping (33 x 30 mm, 8 x 30 mm) DES to RCA  Staged PCI: 2.5 x 8 mm DES to LCx Myoview 01/21/20: EF 47, no ischemia, intermediate risk (low EF)  Cath 01/31/20: LCx and RCA stents patent w mod (50) ISR, mod mid (65) and dist LAD (75) dz >> med rx HFmrEF (heart failure with mildly reduced ejection fraction)   TTE 01/28/2020: EF 45-50, global HK, mild LVH, Gr 2 DD, GLS -16%, normal RVSF, RVSP 40.3, trivial MR, AoV sclerosis (no AS), mild dilation of Asc Aorta (37 mm)  TTE 03/19/2021: EF 50-55 TTE 07/20/2023: EF 55-60, no RWMA, mild LVH, GR 1 DD, normal RVSF, mildly elevated PASP (RVSP 36.1), moderate LAE, trivial MR, AV sclerosis, RAP 8 Chronic kidney disease (eGFR 33) Carotid US 06/01/23: Bilateral ICA 1-39 ABIs 08/2016: Normal bilaterally Hx of DVT 05/2022 s/p mechanical thrombectomy of left common iliac vein, left external iliac vein, left common femoral vein, left femoral vein  Hypertension  Hyperlipidemia   The patient was recently seen in the office for follow-up and he was found to be in SVT with a heart rate in the 220s.  He was sent to the emergency department and spontaneously converted back to sinus rhythm with PACs.  I reviewed all of these EKGs today.  He was reportedly asymptomatic during the entire episode.  He is here today in reports that he is feeling fine.  He denies chest pain, chest pressure, or shortness of breath.  He is had no lightheadedness or presyncope.  He has had no heart palpitations.  Current Medications: Current  Meds  Medication Sig   acetaminophen (TYLENOL) 650 MG CR tablet Take 650 mg by mouth in the morning.   aspirin EC 81 MG tablet Take 1 tablet (81 mg total) by mouth daily. Swallow whole.   atorvastatin (LIPITOR) 40 MG tablet TAKE 1 TABLET BY MOUTH EVERY DAY   calcitRIOL (ROCALTROL) 0.25 MCG capsule TAKE 1 CAPSULE BY MOUTH EVERY DAY (Patient taking differently: Take 0.25 mcg by mouth in the morning.)   carvedilol (COREG) 6.25 MG tablet Take 1 tablet (6.25 mg total) by mouth in the morning and at bedtime.   Cholecalciferol (VITAMIN D3) 50 MCG (2000 UT) TABS Take 2,000 Units by mouth daily.   furosemide (LASIX) 40 MG tablet Take 20 mg by mouth at bedtime.   levothyroxine (SYNTHROID) 175 MCG tablet TAKE 1 TABLET BY MOUTH EVERY DAY (Patient taking differently: Take 175 mcg by mouth daily before breakfast.)   lipase/protease/amylase (CREON) 36000 UNITS CPEP capsule Take 1-2 capsules (36,000-72,000 Units total) by mouth 3 (three) times daily before meals. (Patient taking differently: Take 72,000 Units by mouth 3 (three) times daily before meals. Per patient taking 2 tablets three times a day)   Multiple Vitamins-Minerals (PRESERVISION AREDS 2) CAPS Take 1 capsule by mouth in the morning and at bedtime.   nitroGLYCERIN (NITROSTAT) 0.4 MG SL tablet Place 0.4 mg under the tongue every 5 (  five) minutes as needed for chest pain.   pantoprazole (PROTONIX) 40 MG tablet Take 40 mg by mouth 2 (two) times daily before a meal.   Propylene Glycol (SYSTANE BALANCE) 0.6 % SOLN Place 1 drop into both eyes 3 (three) times daily as needed (for dryness).   Syringe/Needle, Disp, (SYRINGE 3CC/23GX1") 23G X 1" 3 ML MISC Use for IM inj q 2 wks   testosterone cypionate (DEPOTESTOSTERONE CYPIONATE) 200 MG/ML injection INJECT INTO MUSCLE EVERY 2 WEEKS (Patient taking differently: Inject 400 mg into the muscle every 14 (fourteen) days.)   traMADol (ULTRAM) 50 MG tablet Take 1 tablet (50 mg total) by mouth every 6 (six) hours as  needed for severe pain (pain score 7-10).   [DISCONTINUED] carvedilol (COREG) 3.125 MG tablet Take 2 tablets (6.25 mg total) by mouth 2 (two) times daily with a meal.   [DISCONTINUED] carvedilol (COREG) 3.125 MG tablet Take 3.125 mg by mouth 2 (two) times daily with a meal.   [DISCONTINUED] gabapentin (NEURONTIN) 100 MG capsule Take 1-2 capsules (100-200 mg total) by mouth at bedtime. (Patient taking differently: Take 100 mg by mouth at bedtime.)     Allergies:   Biaxin [clarithromycin], Tizanidine, Allantoin-pramoxine, Amlodipine, Amoxicillin, Benzalkonium chloride, Doxycycline, and Metoprolol tartrate   ROS:   Please see the history of present illness.    All other systems reviewed and are negative.  EKGs/Labs/Other Studies Reviewed:    The following studies were reviewed today: Cardiac Studies & Procedures   CARDIAC CATHETERIZATION  CARDIAC CATHETERIZATION 01/31/2020  Narrative  Widely patent left main  LAD is relatively small but does reach the left ventricular apex.  Moderate diffuse disease is noted proximally, there is eccentric 50% mid narrowing before a large septal perforator after an acute bend in the LAD before the origin of the last diagonal there is eccentric 75% stenosis in the LAD.  Distal LAD is less than 2 mm in diameter.  The circumflex system is codominant with RCA.  The circumflex gives origin to have obtuse marginal branches.  The third obtuse marginal contains ostial 60% narrowing.  The proximal circumflex beyond a 90 degree bend contains a previously placed stent that has diffuse in-stent restenosis up to 50%.  No high-grade obstruction is noted.  RCA is a codominant vessel is overlapping Cypher stent from proximal to mid.  The entire stented segment contains diffuse in-stent restenosis up to 50%.  No high-grade focal stenosis is seen.  The nonstented RCA contains moderate diffuse disease.  The EDP is 19 mmHg.  Left ventriculography was not performed because of  kidney impairment.  RECOMMENDATIONS:   No focal high-grade stenosis.  Stents are patent.  Current symptoms are probably not ischemia related.  Further management as per the treating team.  Findings Coronary Findings Diagnostic  Dominance: Co-dominant  Left Anterior Descending Prox LAD to Mid LAD lesion is 25% stenosed. Mid LAD lesion is 65% stenosed. Dist LAD lesion is 75% stenosed.  Left Circumflex Prox Cx to Mid Cx lesion is 50% stenosed. The lesion was previously treated.  Third Obtuse Marginal Branch 3rd Mrg lesion is 30% stenosed.  Right Coronary Artery Ost RCA to Mid RCA lesion is 50% stenosed. The lesion was previously treated.  Intervention  No interventions have been documented.   STRESS TESTS  MYOCARDIAL PERFUSION IMAGING 01/21/2020  Narrative  Nuclear stress EF: 47%. Global hypokinesis  There was no ST segment deviation noted during stress.  There were no significant perfusion defects observed at rest and stress. No  transient ischemic dilatation. No ischemia.  This is an intermediate risk study based upon mildly reduced ejection fraction.  Donato Schultz, MD  ECHOCARDIOGRAM  ECHOCARDIOGRAM COMPLETE 07/20/2023  Narrative ECHOCARDIOGRAM REPORT    Patient Name:   JUPITER KABIR Date of Exam: 07/20/2023 Medical Rec #:  [id number]     Height:       66.0 in Accession #:    [id number]    Weight:       182.2 lb Date of Birth:  01-13-1941    BSA:          1.922 m Patient Age:    81 years      BP:           98/52 mmHg Patient Gender: M             HR:           76 bpm. Exam Location:  [address]  Procedure: 2D Echo, 3D Echo, Cardiac Doppler, Color Doppler and Strain Analysis  Indications:    I25.10 CAD  History:        Patient has prior history of Echocardiogram examinations, most recent 04/01/2021. Combined systolic and diastolic CHF, Previous Myocardial Infarction and CAD, PCI overlapping DES to RCA, staged PCI: DES to LCx, H/O DVT,  Signs/Symptoms:Fatigue, Dizziness/Lightheadedness and Hypotension; Risk Factors:Dyslipidemia and Hypertension. Previous echo done at outside hospital LVEF 55% mild MR/TR PAP 46 mmHg, mild to mod moderate LVH.  Sonographer:    Chanetta Marshall BA, RDCS Referring Phys: Georgina Quint PLOTNIKOV  IMPRESSIONS   1. Left ventricular ejection fraction, by estimation, is 55 to 60%. The left ventricle has normal function. The left ventricle has no regional wall motion abnormalities. There is mild left ventricular hypertrophy. Left ventricular diastolic parameters are consistent with Grade I diastolic dysfunction (impaired relaxation). 2. Right ventricular systolic function is normal. The right ventricular size is normal. There is mildly elevated pulmonary artery systolic pressure. The estimated right ventricular systolic pressure is 36.1 mmHg. 3. Left atrial size was moderately dilated. 4. The mitral valve is degenerative. Trivial mitral valve regurgitation. Moderate mitral annular calcification. 5. Aortic valve regurgitation is not visualized. Aortic valve sclerosis/calcification is present, without any evidence of aortic stenosis. 6. The inferior vena cava is dilated in size with >50% respiratory variability, suggesting right atrial pressure of 8 mmHg.  FINDINGS Left Ventricle: Left ventricular ejection fraction, by estimation, is 55 to 60%. The left ventricle has normal function. The left ventricle has no regional wall motion abnormalities. The left ventricular internal cavity size was normal in size. There is mild left ventricular hypertrophy. Left ventricular diastolic parameters are consistent with Grade I diastolic dysfunction (impaired relaxation).  Right Ventricle: The right ventricular size is normal. Right ventricular systolic function is normal. There is mildly elevated pulmonary artery systolic pressure. The tricuspid regurgitant velocity is 2.65 m/s, and with an assumed right atrial pressure of  8 mmHg, the estimated right ventricular systolic pressure is 36.1 mmHg.  Left Atrium: Left atrial size was moderately dilated.  Right Atrium: Right atrial size was normal in size.  Pericardium: There is no evidence of pericardial effusion.  Mitral Valve: The mitral valve is degenerative in appearance. Moderate mitral annular calcification. Trivial mitral valve regurgitation.  Tricuspid Valve: Tricuspid valve regurgitation is mild.  Aortic Valve: Aortic valve regurgitation is not visualized. Aortic valve sclerosis/calcification is present, without any evidence of aortic stenosis.  Pulmonic Valve: Pulmonic valve regurgitation is not visualized.  Aorta: The aortic root and ascending aorta are  structurally normal, with no evidence of dilitation.  Venous: The inferior vena cava is dilated in size with greater than 50% respiratory variability, suggesting right atrial pressure of 8 mmHg.  IAS/Shunts: No atrial level shunt detected by color flow Doppler.   LEFT VENTRICLE PLAX 2D LVIDd:         4.60 cm   Diastology LVIDs:         3.30 cm   LV e' medial:    8.16 cm/s LV PW:         0.90 cm   LV E/e' medial:  14.5 LV IVS:        1.10 cm   LV e' lateral:   7.83 cm/s LVOT diam:     2.30 cm   LV E/e' lateral: 15.1 LV SV:         104 LV SV Index:   54 LVOT Area:     4.15 cm   RIGHT VENTRICLE             IVC RV Basal diam:  3.60 cm     IVC diam: 2.20 cm RV Mid diam:    2.70 cm RV S prime:     11.50 cm/s TAPSE (M-mode): 2.1 cm  LEFT ATRIUM             Index        RIGHT ATRIUM           Index LA diam:        4.20 cm 2.19 cm/m   RA Area:     17.30 cm LA Vol (A2C):   84.6 ml 44.01 ml/m  RA Volume:   41.60 ml  21.64 ml/m LA Vol (A4C):   73.3 ml 38.13 ml/m LA Biplane Vol: 80.2 ml 41.72 ml/m AORTIC VALVE LVOT Vmax:   122.67 cm/s LVOT Vmean:  82.967 cm/s LVOT VTI:    0.250 m  AORTA Ao Root diam: 3.40 cm Ao Asc diam:  3.40 cm  MITRAL VALVE                TRICUSPID VALVE MV Area  (PHT): 2.88 cm     TR Peak grad:   28.1 mmHg MV Decel Time: 263 msec     TR Vmax:        265.00 cm/s MV E velocity: 118.00 cm/s MV A velocity: 129.67 cm/s  SHUNTS MV E/A ratio:  0.91         Systemic VTI:  0.25 m Systemic Diam: 2.30 cm  Carolan Clines Electronically signed by Carolan Clines Signature Date/Time: 07/20/2023/3:24:27 PM    Final             EKG:   EKG Interpretation Date/Time:  Friday November 24 2023 14:32:49 EST Ventricular Rate:  83 PR Interval:  118 QRS Duration:  72 QT Interval:  320 QTC Calculation: 376 R Axis:   -13  Text Interpretation: Normal sinus rhythm with sinus arrhythmia Left ventricular hypertrophy with repolarization abnormality ( R in aVL ) Inferior infarct , age undetermined When compared with ECG of 20-Nov-2023 18:08, PREVIOUS ECG IS PRESENT No significant change was found Confirmed by Tonny Bollman [phone]) on 11/24/2023 2:37:57 PM    Recent Labs: 04/06/2023: TSH 2.89 07/24/2023: ALT 26 11/20/2023: B Natriuretic Peptide 562.0; BUN 68; Creatinine, Ser 2.58; Hemoglobin 13.9; Magnesium 2.0; Platelets 83; Potassium 4.3; Sodium 140  Recent Lipid Panel    Component Value Date/Time   CHOL 143 06/25/2022 0759   CHOL 136 06/15/2022 0726  TRIG 88 06/25/2022 0759   HDL 42 06/25/2022 0759   HDL 56 06/15/2022 0726   CHOLHDL 3.4 06/25/2022 0759   VLDL 18 06/25/2022 0759   LDLCALC 83 06/25/2022 0759   LDLCALC 65 06/15/2022 0726     Risk Assessment/Calculations:                Physical Exam:    VS:  BP 120/74   Pulse 83   Ht 6' (1.829 m)   Wt 178 lb 9.6 oz (81 kg)   SpO2 94%   BMI 24.22 kg/m     Wt Readings from Last 3 Encounters:  11/24/23 178 lb 9.6 oz (81 kg)  11/20/23 180 lb 6.4 oz (81.8 kg)  11/08/23 176 lb 11.2 oz (80.2 kg)     GEN:  Well nourished, well developed in no acute distress HEENT: Normal NECK: No JVD; No carotid bruits LYMPHATICS: No lymphadenopathy CARDIAC: RRR, 2/6 systolic ejection murmur at the right upper sternal  border RESPIRATORY:  Clear to auscultation without rales, wheezing or rhonchi  ABDOMEN: Soft, non-tender, non-distended MUSCULOSKELETAL:  No edema; No deformity  SKIN: Warm and dry NEUROLOGIC:  Alert and oriented x 3 PSYCHIATRIC:  Normal affect   Assessment & Plan Essential hypertension The patient is stable with no further swings in his blood pressures.  I am going to increase his beta-blocker to try to reduce his risk of recurrent SVT.  I think he can tolerate this okay and his carvedilol will be increased to 6.25 mg twice daily.  Blood pressure appears to be well-controlled. Coronary artery disease involving native coronary artery of native heart without angina pectoris Doing well.  No symptoms of angina even with his rapid heart rate during SVT.  Continue beta-blocker and high intensity statin drug.  Should restart aspirin 81 mg daily. SVT (supraventricular tachycardia) (HCC) I reviewed his EKG which demonstrated SVT with a heart rate of 220 bpm.  He has been back in sinus rhythm and appears to be doing well at present.  Surprisingly, he was completely asymptomatic even with that very fast heart rate.  I would like him to wear a 2-week ZIO monitor and this will be arranged today.  Increase carvedilol to 6.25 mg twice daily as above.            Medication Adjustments/Labs and Tests Ordered: Current medicines are reviewed at length with the patient today.  Concerns regarding medicines are outlined above.  Orders Placed This Encounter  Procedures   EKG 12-Lead   Meds ordered this encounter  Medications   carvedilol (COREG) 6.25 MG tablet    Sig: Take 1 tablet (6.25 mg total) by mouth in the morning and at bedtime.    Dispense:  180 tablet    Refill:  3    Dose increased   aspirin EC 81 MG tablet    Sig: Take 1 tablet (81 mg total) by mouth daily. Swallow whole.    Patient Instructions  Medication Instructions:  Start Coreg 6.25 mg one tablet twice daily  Resume Aspirin 81  mg daily   *If you need a refill on your cardiac medications before your next appointment, please call your pharmacy*  Testing/Procedures: ZIO patch 14 day   Your physician has recommended that you wear an event monitor. Event monitors are medical devices that record the heart's electrical activity. Doctors most often [place] these monitors to diagnose arrhythmias. Arrhythmias are problems with the speed or rhythm of the heartbeat. The monitor is a small,  portable device. You can wear one while you do your normal daily activities. This is usually used to diagnose what is causing palpitations/syncope (passing out).   Follow-Up: At [facility], you and your health needs are our priority.  As part of our continuing mission to provide you with exceptional heart care, we have created designated Provider Care Teams.  These Care Teams include your primary Cardiologist (physician) and Advanced Practice Providers (APPs -  Physician Assistants and Nurse Practitioners) who all work together to provide you with the care you need, when you need it.   Your next appointment:   3 month(s)  Provider:   Jari Favre, PA-C, Ronie Spies, PA-C, Robin Searing, NP, Jacolyn Reedy, PA-C, Eligha Bridegroom, NP, Tereso Newcomer, PA-C, or Perlie Gold, PA-C     Then, Tonny Bollman, MD will plan to see you again in 1 year(s).    Other Instructions  ZIO XT- Long Term Monitor Instructions     Your physician has requested you wear a ZIO patch monitor for 14 days.  This is a single patch monitor. [employer] supplies one patch monitor per enrollment. Additional  stickers are not available. Please do not apply patch if you will be having a Nuclear Stress Test,  Echocardiogram, Cardiac CT, MRI, or Chest Xray during the period you would be wearing the  monitor. The patch cannot be worn during these tests. You cannot remove and re-apply the  ZIO XT patch monitor.  Your ZIO patch monitor will be mailed 3 day USPS to your address  on file. It may take 3-5 days  to receive your monitor after you have been enrolled.  Once you have received your monitor, please review the enclosed instructions. Your monitor  has already been registered assigning a specific monitor serial # to you.     Billing and Patient Assistance Program Information     We have supplied [employer] with any of your insurance information on file for billing purposes.  [employer] offers a sliding scale Patient Assistance Program for patients that do not have  insurance, or whose insurance does not completely cover the cost of the ZIO monitor.  You must apply for the Patient Assistance Program to qualify for this discounted rate.  To apply, please call [employer] at [phone], select option 4, select option 2, ask to apply for  Patient Assistance Program. Meredeth Ide will ask your household income, and how many people  are in your household. They will quote your out-of-pocket cost based on that information.  [employer] will also be able to set up a 28-month, interest-free payment plan if needed.     Applying the monitor     Shave hair from upper left chest.  Hold abrader disc by orange tab. Rub abrader in 40 strokes over the upper left chest as  indicated in your monitor instructions.  Clean area with 4 enclosed alcohol pads. Let dry.  Apply patch as indicated in monitor instructions. Patch will be placed under collarbone on left  side of chest with arrow pointing upward.  Rub patch adhesive wings for 2 minutes. Remove white label marked "1". Remove the white  label marked "2". Rub patch adhesive wings for 2 additional minutes.  While looking in a mirror, press and release button in center of patch. A small green light will  flash 3-4 times. This will be your only indicator that the monitor has been turned on.  Do not shower for the first 24 hours. You may shower after the first  24 hours.  Press the button if you feel a symptom. You will hear a small click. Record  Date, Time and  Symptom in the Patient Logbook.  When you are ready to remove the patch, follow instructions on the last 2 pages of Patient  Logbook. Stick patch monitor onto the last page of Patient Logbook.  Place Patient Logbook in the blue and white box. Use locking tab on box and tape box closed  securely. The blue and white box has prepaid postage on it. Please place it in the mailbox as  soon as possible. Your physician should have your test results approximately 7 days after the  monitor has been mailed back to [facility].  Call [facility] Customer Care at [phone] if you have questions regarding  your ZIO XT patch monitor. Call them immediately if you see an orange light blinking on your  monitor.  If your monitor falls off in less than 4 days, contact our Monitor department at [phone].  If your monitor becomes loose or falls off after 4 days call [employer] at [phone] for  suggestions on securing your monitor.         Signed, Tonny Bollman, MD  11/24/2023 5:08 PM    [facility]

## 2023-11-24 NOTE — Assessment & Plan Note (Signed)
Doing well.  No symptoms of angina even with his rapid heart rate during SVT.  Continue beta-blocker and high intensity statin drug.  Should restart aspirin 81 mg daily.

## 2023-11-24 NOTE — Progress Notes (Unsigned)
Subjective: Chief Complaint  Patient presents with   Foot Ulcer    RM#11 Bilateral heel ulcers minimal drainage pain when walking.   83 year old male presents after the above concerns.  He states that he has a wound on the right heel.  He has been keeping antibiotic ointment area daily.  He denies any drainage or pus or increasing swelling or redness.  He does not report any fevers or chills.  No other concerns.  Objective: AAO x3, NAD DP/PT pulses palpable bilaterally, CRT less than 3 seconds To the posterior aspect the right heel ulceration measures 0.8 x 0.7 x 0.1 cm granular wound base.  There is no surrounding erythema, ascending cellulitis.  No fluctuation or crepitation.  There is no malodor.  There is a preulcerative area noted to the left posterior heel. No pain with calf compression, swelling, warmth, erythema  Assessment: Right heel ulceration, left preulcerative lesion  Plan: -All treatment options discussed with the patient including all alternatives, risks, complications.  -The ordering ABI given ulceration. -Will switch to using Iodosorb dressing changes daily to the right posterior heel wound which was applied today. -Discussed offloading, floating the heels. -Discussed wearing open back shoe to avoid any pressure. -Monitor for any clinical signs or symptoms of infection and directed to call the office immediately should any occur or go to the ER. -Patient encouraged to call the office with any questions, concerns, change in symptoms.   Return in about 2 weeks (around 12/08/2023) for heel ulcer.  Vivi Barrack DPM

## 2023-12-04 DIAGNOSIS — M545 Low back pain, unspecified: Secondary | ICD-10-CM | POA: Diagnosis not present

## 2023-12-08 ENCOUNTER — Encounter: Payer: Self-pay | Admitting: Podiatry

## 2023-12-08 ENCOUNTER — Ambulatory Visit: Payer: PPO | Admitting: Podiatry

## 2023-12-08 DIAGNOSIS — L97522 Non-pressure chronic ulcer of other part of left foot with fat layer exposed: Secondary | ICD-10-CM

## 2023-12-08 DIAGNOSIS — H353211 Exudative age-related macular degeneration, right eye, with active choroidal neovascularization: Secondary | ICD-10-CM | POA: Diagnosis not present

## 2023-12-08 DIAGNOSIS — L97411 Non-pressure chronic ulcer of right heel and midfoot limited to breakdown of skin: Secondary | ICD-10-CM

## 2023-12-08 MED ORDER — CLINDAMYCIN HCL 300 MG PO CAPS: 300.0000 mg | ORAL_CAPSULE | Freq: Three times a day (TID) | ORAL | 0 refills | Status: DC

## 2023-12-08 NOTE — Progress Notes (Signed)
 Subjective: Chief Complaint  Patient presents with   Foot Ulcer    RM# Bilateral ulcers more pain on left heel.    83 year old male presents after the above concerns.  States that he gets some pain mostly to the left heel now in the morning.  Present Changes Daily.  She Does Not Report Any Drainage or Pus.  He States That There Is Less concerning More Than the Right Was Last Time the Right Was Hurting More Than the Left.  He Has Not yet Had the ABI Completed.   Objective: AAO x3, NAD DP/PT pulses palpable bilaterally, CRT less than 3 seconds To the posterior aspect the right heel appears that the wound is doing much better and almost completely healed and only small amount of greenish tissue still present.  Today the wound is worse on the left side measuring 1.4 x 0.5 cm with a depth of 0.2 cm.  There is no probing to bone, no tunneling.  There is no surrounding erythema, ascending size.  No fluctuation, crepitation or malodor. No pain with calf compression, swelling, warmth, erythema       Assessment: Right heel ulceration, left preulcerative lesion  Plan: -All treatment options discussed with the patient including all alternatives, risks, complications.  -Clean the wounds today.  Bandage was applied. -Will continue with iodosorb dressing changes for now.  Continue with offloading at all times.  I recommended him to schedule ABI.  Will try to follow-up on this for him as well Vania and the contact information to get scheduled. -Discussed offloading, floating the heels. -Discussed wearing open back shoe to avoid any pressure. -Monitor for any clinical signs or symptoms of infection and directed to call the office immediately should any occur or go to the ER. -Patient encouraged to call the office with any questions, concerns, change in symptoms.   Return in about 2 weeks (around 12/22/2023) for heel ulcers.  Donnice JONELLE Fees DPM

## 2023-12-08 NOTE — Patient Instructions (Signed)
 Keep a pillow under your legs so your heels don't touch the bed to decrease pressure  Call vein and vascular to schedule the circulation test   Monitor for any signs/symptoms of infection. Call the office immediately if any occur or go directly to the emergency room. Call with any questions/concerns.

## 2023-12-12 DIAGNOSIS — I10 Essential (primary) hypertension: Secondary | ICD-10-CM | POA: Diagnosis not present

## 2023-12-12 DIAGNOSIS — I471 Supraventricular tachycardia, unspecified: Secondary | ICD-10-CM | POA: Diagnosis not present

## 2023-12-13 DIAGNOSIS — M170 Bilateral primary osteoarthritis of knee: Secondary | ICD-10-CM | POA: Diagnosis not present

## 2023-12-13 DIAGNOSIS — M545 Low back pain, unspecified: Secondary | ICD-10-CM | POA: Diagnosis not present

## 2023-12-14 ENCOUNTER — Telehealth: Payer: Self-pay

## 2023-12-14 NOTE — Telephone Encounter (Signed)
Triage: -pt called stating his podiatrist wanted him to come back to the vascular doctor because of his non-healing wounds -reviewed chart and noted recent visit on 11/09/23 in which he asked to have his Eliquis discontinued.   -since then his leg has become more swollen, and his heel has a real painful crack in the skin.  He states he been elevating his feet especially at night but can't say that he is off-loading his heels.  He also stopped wearing his compression hose because  of his heels. -DVT booked

## 2023-12-15 ENCOUNTER — Encounter (HOSPITAL_COMMUNITY): Payer: PPO

## 2023-12-15 ENCOUNTER — Other Ambulatory Visit: Payer: Self-pay | Admitting: *Deleted

## 2023-12-15 ENCOUNTER — Ambulatory Visit (HOSPITAL_COMMUNITY): Admission: RE | Admit: 2023-12-15 | Payer: PPO | Source: Ambulatory Visit

## 2023-12-15 DIAGNOSIS — I82422 Acute embolism and thrombosis of left iliac vein: Secondary | ICD-10-CM

## 2023-12-15 DIAGNOSIS — I82512 Chronic embolism and thrombosis of left femoral vein: Secondary | ICD-10-CM

## 2023-12-15 DIAGNOSIS — L03119 Cellulitis of unspecified part of limb: Secondary | ICD-10-CM

## 2023-12-15 DIAGNOSIS — I872 Venous insufficiency (chronic) (peripheral): Secondary | ICD-10-CM

## 2023-12-16 DIAGNOSIS — M545 Low back pain, unspecified: Secondary | ICD-10-CM | POA: Diagnosis not present

## 2023-12-20 ENCOUNTER — Ambulatory Visit: Payer: PPO | Admitting: Internal Medicine

## 2023-12-22 ENCOUNTER — Encounter: Payer: Self-pay | Admitting: Podiatry

## 2023-12-22 ENCOUNTER — Ambulatory Visit: Payer: PPO | Admitting: Podiatry

## 2023-12-22 DIAGNOSIS — L97522 Non-pressure chronic ulcer of other part of left foot with fat layer exposed: Secondary | ICD-10-CM

## 2023-12-22 DIAGNOSIS — L97422 Non-pressure chronic ulcer of left heel and midfoot with fat layer exposed: Secondary | ICD-10-CM

## 2023-12-25 ENCOUNTER — Ambulatory Visit: Payer: PPO | Admitting: Podiatry

## 2023-12-25 DIAGNOSIS — M545 Low back pain, unspecified: Secondary | ICD-10-CM | POA: Diagnosis not present

## 2023-12-26 NOTE — Progress Notes (Signed)
 Subjective: Chief Complaint  Patient presents with   Foot Ulcer    RM#14 2 wk Bilateral  heel ulcer follow up patient doing well.     83 year old male presents after the above concerns.  He states overall the wounds are doing better and is having decreased pain.  Continue vasopressin changes daily.  He he recently quit she is able to schedule his arterial studies.  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  He does not report any fevers or chills.  No new ulcerations.  No other concerns.   Objective: AAO x3, NAD DP/PT pulses palpable bilaterally, CRT less than 3 seconds To the posterior aspect the right heel appears that the wound is doing much better and almost completely healed.  On the left posterior heel wound still remains dense granulation tissue present in the wound bed.  It is somewhat smaller today.  There is no probing to bone, undermining or tunneling to either of the wounds.  There is no fluctuation, crepitation, border. No pain with calf compression, swelling, warmth, erythema          Assessment: Bilateral heel ulcerations  Plan: -All treatment options discussed with the patient including all alternatives, risks, complications.  -Clean the wounds today.  I did soften the bandage was applied. -Will continue with iodosorb dressing changes for now.  Continue with offloading at all times.  I recommended him to schedule ABI.  He was scheduled for this.  -Continue offloading, floating the heels. -Discussed wearing open back shoe to avoid any pressure. -Monitor for any clinical signs or symptoms of infection and directed to call the office immediately should any occur or go to the ER. -Patient encouraged to call the office with any questions, concerns, change in symptoms.   Return in about 2 weeks (around 01/05/2024) for ulcer bilateral heels.  Vivi Barrack DPM

## 2023-12-29 DIAGNOSIS — M25562 Pain in left knee: Secondary | ICD-10-CM | POA: Diagnosis not present

## 2024-01-01 ENCOUNTER — Ambulatory Visit: Payer: PPO | Admitting: Internal Medicine

## 2024-01-03 ENCOUNTER — Ambulatory Visit: Payer: PPO | Admitting: Physician Assistant

## 2024-01-03 ENCOUNTER — Other Ambulatory Visit: Payer: Self-pay

## 2024-01-03 ENCOUNTER — Ambulatory Visit (HOSPITAL_COMMUNITY)
Admission: RE | Admit: 2024-01-03 | Discharge: 2024-01-03 | Disposition: A | Discharge status: Home / Self Care | Payer: PPO | Source: Ambulatory Visit | Attending: Physician Assistant | Admitting: Physician Assistant

## 2024-01-03 ENCOUNTER — Ambulatory Visit: Payer: PPO | Admitting: Internal Medicine

## 2024-01-03 VITALS — BP 153/75 | HR 63 | Temp 98.6°F | Resp 20 | Ht 72.0 in | Wt 178.0 lb

## 2024-01-03 DIAGNOSIS — M79672 Pain in left foot: Secondary | ICD-10-CM

## 2024-01-03 DIAGNOSIS — L03119 Cellulitis of unspecified part of limb: Secondary | ICD-10-CM | POA: Insufficient documentation

## 2024-01-03 DIAGNOSIS — R29898 Other symptoms and signs involving the musculoskeletal system: Secondary | ICD-10-CM | POA: Diagnosis not present

## 2024-01-03 DIAGNOSIS — L97421 Non-pressure chronic ulcer of left heel and midfoot limited to breakdown of skin: Secondary | ICD-10-CM

## 2024-01-03 DIAGNOSIS — L97411 Non-pressure chronic ulcer of right heel and midfoot limited to breakdown of skin: Secondary | ICD-10-CM

## 2024-01-03 NOTE — Progress Notes (Unsigned)
 Office Note   History of Present Illness   Scott Oneal is a 83 y.o. (April 26, 1941) male who presents for surveillance of PAD. They have a history of ***  The patient returns today for follow up. He/she denies any recent medical history changes. The patient also denies any claudication, rest pain, or tissue loss of the lower extremities.  Current Outpatient Medications  Medication Sig Dispense Refill   acetaminophen (TYLENOL) 650 MG CR tablet Take 650 mg by mouth in the morning.     aspirin EC 81 MG tablet Take 1 tablet (81 mg total) by mouth daily. Swallow whole.     atorvastatin (LIPITOR) 40 MG tablet TAKE 1 TABLET BY MOUTH EVERY DAY 90 tablet 3   calcitRIOL (ROCALTROL) 0.25 MCG capsule TAKE 1 CAPSULE BY MOUTH EVERY DAY (Patient taking differently: Take 0.25 mcg by mouth in the morning.) 90 capsule 1   carvedilol (COREG) 6.25 MG tablet Take 1 tablet (6.25 mg total) by mouth in the morning and at bedtime. 180 tablet 3   Cholecalciferol (VITAMIN D3) 50 MCG (2000 UT) TABS Take 2,000 Units by mouth daily.     clindamycin (CLEOCIN) 300 MG capsule Take 1 capsule (300 mg total) by mouth 3 (three) times daily. (Patient not taking: Reported on 12/22/2023) 21 capsule 0   furosemide (LASIX) 40 MG tablet Take 20 mg by mouth at bedtime.     levothyroxine (SYNTHROID) 175 MCG tablet TAKE 1 TABLET BY MOUTH EVERY DAY (Patient taking differently: Take 175 mcg by mouth daily before breakfast.) 90 tablet 3   lipase/protease/amylase (CREON) 36000 UNITS CPEP capsule Take 1-2 capsules (36,000-72,000 Units total) by mouth 3 (three) times daily before meals. (Patient taking differently: Take 72,000 Units by mouth 3 (three) times daily before meals. Per patient taking 2 tablets three times a day) 300 capsule 5   Multiple Vitamins-Minerals (PRESERVISION AREDS 2) CAPS Take 1 capsule by mouth in the morning and at bedtime.     nitroGLYCERIN (NITROSTAT) 0.4 MG SL tablet Place 0.4 mg under the tongue every 5 (five)  minutes as needed for chest pain.     pantoprazole (PROTONIX) 40 MG tablet Take 40 mg by mouth 2 (two) times daily before a meal.     Propylene Glycol (SYSTANE BALANCE) 0.6 % SOLN Place 1 drop into both eyes 3 (three) times daily as needed (for dryness).     Syringe/Needle, Disp, (SYRINGE 3CC/23GX1") 23G X 1" 3 ML MISC Use for IM inj q 2 wks 50 each 3   testosterone cypionate (DEPOTESTOSTERONE CYPIONATE) 200 MG/ML injection INJECT INTO MUSCLE EVERY 2 WEEKS (Patient taking differently: Inject 400 mg into the muscle every 14 (fourteen) days.) 3 mL 3   traMADol (ULTRAM) 50 MG tablet Take 1 tablet (50 mg total) by mouth every 6 (six) hours as needed for severe pain (pain score 7-10). 120 tablet 3   No current facility-administered medications for this visit.    ***REVIEW OF SYSTEMS (negative unless checked):   Cardiac:  []  Chest pain or chest pressure? []  Shortness of breath upon activity? []  Shortness of breath when lying flat? []  Irregular heart rhythm?  Vascular:  []  Pain in calf, thigh, or hip brought on by walking? []  Pain in feet at night that wakes you up from your sleep? []  Blood clot in your veins? []  Leg swelling?  Pulmonary:  []  Oxygen at home? []  Productive cough? []  Wheezing?  Neurologic:  []  Sudden weakness in arms or legs? []  Sudden numbness  in arms or legs? []  Sudden onset of difficult speaking or slurred speech? []  Temporary loss of vision in one eye? []  Problems with dizziness?  Gastrointestinal:  []  Blood in stool? []  Vomited blood?  Genitourinary:  []  Burning when urinating? []  Blood in urine?  Psychiatric:  []  Major depression  Hematologic:  []  Bleeding problems? []  Problems with blood clotting?  Dermatologic:  []  Rashes or ulcers?  Constitutional:  []  Fever or chills?  Ear/Nose/Throat:  []  Change in hearing? []  Nose bleeds? []  Sore throat?  Musculoskeletal:  []  Back pain? []  Joint pain? []  Muscle pain?   Physical Examination   ***There were no vitals filed for this visit. ***There is no height or weight on file to calculate BMI.  General:  WDWN in NAD; vital signs documented above Gait: Not observed HENT: WNL, normocephalic Pulmonary: normal non-labored breathing , without rales, rhonchi,  wheezing Cardiac: {Desc; regular/irreg:14544} HR, without murmurs {With/Without:20273} carotid bruit*** Abdomen: soft, NT, no masses Skin: {With/Without:20273} rashes Vascular Exam/Pulses: Palpable/nonpalpable femoral pulses, palpable/nonpalpable popliteal pulses, palpable/nonpalpable pedal pulses. Left DP/PT/Peroneal doppler signals. Right DP/PT/Peroneal doppler signals Extremities: {With/Without:20273} ischemic changes, {With/Without:20273} gangrene , {With/Without:20273} cellulitis; {With/Without:20273} open wounds;  Musculoskeletal: no muscle wasting or atrophy  Neurologic: A&O X 3;  No focal weakness or paresthesias are detected Psychiatric:  The pt has {Desc; normal/abnormal:11317::"Normal"} affect.  Non-Invasive Vascular imaging   ABI (***) R:  ABI: *** (***),  PT: {Signals:19197::"none","mono","bi","tri"} DP: {Signals:19197::"none","mono","bi","tri"} TBI:  *** L:  ABI: *** (***),  PT: {Signals:19197::"none","mono","bi","tri"} DP: {Signals:19197::"none","mono","bi","tri"} TBI: ***  Aortoiliac Duplex (***)   *** Arterial Duplex (***)   Medical Decision Making   SHAHZAD THOMANN is a 83 y.o. male who presents for surveillance of PAD  Based on the patient's vascular studies, their ABIs are essentially unchanged since last visit. *** Arterial duplex *** The patient denies any claudication, rest pain, or tissue loss. They have palpable/nonpalpable pedal pulses with *** doppler signals They will continue their *** and follow up with our office in *** months/year with ABIs and ***   Loel Dubonnet PA-C [facility] Office: [phone]  Clinic MD: ***

## 2024-01-05 ENCOUNTER — Ambulatory Visit: Payer: PPO | Admitting: Internal Medicine

## 2024-01-05 DIAGNOSIS — M25562 Pain in left knee: Secondary | ICD-10-CM | POA: Diagnosis not present

## 2024-01-08 ENCOUNTER — Other Ambulatory Visit (INDEPENDENT_AMBULATORY_CARE_PROVIDER_SITE_OTHER): Payer: Self-pay

## 2024-01-08 ENCOUNTER — Ambulatory Visit: Admitting: Physician Assistant

## 2024-01-08 ENCOUNTER — Encounter: Payer: Self-pay | Admitting: Physician Assistant

## 2024-01-08 DIAGNOSIS — M5440 Lumbago with sciatica, unspecified side: Secondary | ICD-10-CM

## 2024-01-08 DIAGNOSIS — M25562 Pain in left knee: Secondary | ICD-10-CM

## 2024-01-08 NOTE — Progress Notes (Signed)
 Office Visit Note   Patient: Scott Oneal           Date of Birth: 10-25-1941           MRN: [id number] Visit Date: 01/08/2024              Requested by: Ernestene Mention, PA-C [address] [place],  [place] [address] PCP: Plotnikov, Georgina Quint, MD   Assessment & Plan: Visit Diagnoses:  1. Acute pain of left knee   2. Acute bilateral low back pain with sciatica, sciatica laterality unspecified     Plan: I had an extensive discussion with the patient and his caregiver.  Patient is a poor historian.  This certainly be can be caused by his lumbar stenosis but he favors that his knee gives out.  He has had some cellulitis I am nervous about giving him a steroid shot.  He has been seen multiple times at [facility] and is received physical therapy there until recently.  I will go ahead and write for a bruit stabilizing brace for his knee.  He is not a surgical candidate for his spine.  I talked about the importance of using a walker to steady himself.  Follow-Up Instructions: Return if symptoms worsen or fail to improve, for May follow-up with [place] as needed.   Orders:  Orders Placed This Encounter  Procedures   XR Lumbar Spine 2-3 Views   XR Knee 1-2 Views Left   No orders of the defined types were placed in this encounter.     Procedures: No procedures performed   Clinical Data: No additional findings.   Subjective: Chief Complaint  Patient presents with   Left Leg - Weakness   Patient is an 83 year old gentleman who is accompanied by caregiver.  He presents today with a chief Complaint of left lower extremity weakness and his knee giving way.  He has been seeing [facility] for this and has been doing physical therapy.  He did have a fall today.  His diagnosis was emerges been lumbar radiculopathy and osteoarthritis of his left knee Weakness Associated symptoms include weakness.    Review of Systems  Neurological:  Positive for weakness.  All other systems reviewed and  are negative.    Objective: Vital Signs: There were no vitals taken for this visit.  Physical Exam Constitutional:      Appearance: Normal appearance.  Pulmonary:     Effort: Pulmonary effort is normal.  Skin:    General: Skin is warm and dry.  Neurological:     General: No focal deficit present.     Mental Status: He is alert and oriented to person, place, and time.  Psychiatric:        Mood and Affect: Mood normal.        Behavior: Behavior normal.     Ortho Exam Left knee some mild soft tissue swelling no warmth no erythema no effusion compartments are soft and nontender he has quite a bit of thigh and calf atrophy.  Negative straight leg raise no tenderness to the lower spine does complain of some pain in his knee Specialty Comments:  No specialty comments available.  Imaging: XR Lumbar Spine 2-3 Views Result Date: 01/08/2024 Trays of his lumbar spine does not demonstrate multilevel degenerative changes no acute fractures  XR Knee 1-2 Views Left Result Date: 01/08/2024 Follow-up.  He does have tricompartmental arthritis    PMFS History: Patient Active Problem List   Diagnosis Date Noted   Dysphagia  10/03/2023   Knee pain, chronic 10/03/2023   Bladder neck obstruction 09/05/2023   Heart failure with mildly reduced ejection fraction (HFmrEF) (HCC) 07/24/2023   Mixed hyperlipidemia 07/24/2023   CKD (chronic kidney disease) stage 4, GFR 15-29 ml/min (HCC) 07/24/2023   Elevated alkaline phosphatase level 07/24/2023   Tachycardia 07/04/2023   Lumbar spondylosis 05/30/2023   Vasomotor rhinitis 05/15/2023   Situational depression 02/09/2023   Dyslipidemia 01/12/2023   Familial tremor 01/12/2023   Gait disorder 12/26/2022   Neuropathic pain 12/02/2022   Claudication (HCC) 09/20/2022   Postphlebitic syndrome 08/11/2022   Anticoagulant long-term use 06/29/2022   Absent foot pulse 06/23/2022   Cold left foot 06/23/2022   Leg pain, central, left 06/23/2022   DVT  (deep venous thrombosis) (HCC) 06/23/2022   Acute on chronic kidney failure (HCC) 06/23/2022   CAD S/P percutaneous coronary angioplasty 06/23/2022   Arthritis 06/23/2022   PMR (polymyalgia rheumatica) (HCC) 05/31/2022   Spinal stenosis of lumbar region 12/27/2021   Hemicrania 12/27/2021   Vision changes 12/27/2021   Postural dizziness with presyncope 12/13/2021   Erythema of lower extremity 09/08/2021   History of cellulitis 09/08/2021   Venous stasis dermatitis 09/08/2021   Kidney stone 06/21/2021   Dermatitis 06/08/2021   Pancreatic insufficiency 05/31/2021   Lower extremity weakness 05/31/2021   Tinea pedis 05/31/2021   Nephrolithiasis 05/13/2021   Abnormal TSH 04/14/2021   Cellulitis of lower leg 04/05/2021   Dehydration 02/24/2021   Congestive heart failure (HCC) 03/02/2020   Diastolic dysfunction 02/06/2020   Anginal equivalent (HCC)    Jaw pain 01/08/2020   Hip pain 10/10/2019   Night sweats 07/25/2019   Hand pain, right 04/24/2019   Degeneration of lumbar intervertebral disc 01/01/2019   Coccyx pain 11/13/2018   Hip bursitis, left 09/10/2018   Sciatica associated with disorder of lumbar spine 09/10/2018   Peroneal neuropathy at knee, left 07/09/2018   CKD (chronic kidney disease) stage 3, GFR 30-59 ml/min (HCC) 05/14/2018   Macular degeneration 04/26/2018   Osteoarthritis of knee 01/08/2018   Bilateral buttock pain 11/29/2017   Cough 09/15/2017   Bursitis of hip, right 07/18/2017   Ischial bursitis 05/17/2017   Asthmatic bronchitis 05/17/2017   Elbow pain, left 11/21/2016   Irregular heart beat 09/27/2016   Hiccups 07/14/2016   Edema 06/06/2016   Insomnia 06/06/2016   Thigh pain 03/15/2016   Tinnitus 09/15/2015   Cramps of lower extremity 09/15/2015   UTI (urinary tract infection) 06/15/2015   Umbilical hernia 06/15/2015   Lower GI bleed 11/26/2014   Rectal bleeding 11/25/2014   History of colonic polyps 09/12/2014   Left foot pain 08/11/2014   Right  foot pain 06/10/2014   Oral candidiasis 05/15/2014   Drug rash 04/30/2014   Low back pain 01/29/2014   Anxiety disorder 11/27/2013   Arthralgia 09/30/2013   Preop exam for internal medicine 06/19/2013   Preoperative clearance 06/19/2013   Urinary frequency 12/12/2012   Actinic keratoses 12/12/2012   Thrombocytopenia (HCC) 11/19/2012   Well adult exam 09/10/2012   Shoulder pain, left 09/10/2012   Warts 09/10/2012   Headache 11/29/2011   Hyperkalemia 08/01/2011   Neuropathy of hand 08/01/2011   Numbness 04/28/2011   Fatigue 04/28/2011   Cellulitis of trunk 04/21/2011   Chalazion 07/07/2010   Disorder resulting from impaired renal function 03/10/2010   Neoplasm of uncertain behavior of skin 11/09/2009   PSA, INCREASED 11/09/2009   HYPERCHOLESTEROLEMIA 08/31/2009   Hypogonadism in male 07/03/2008   BRADYCARDIA 07/03/2008   History of  colonic polyps 07/02/2008   Osteoporosis 06/03/2008   CHOLELITHIASIS 04/29/2008   Calculus of gallbladder 04/29/2008   Celiac disease 04/29/2008   WEIGHT LOSS 04/08/2008   GERD (gastroesophageal reflux disease) 04/02/2008   TUBULOVILLOUS ADENOMA, COLON 04/01/2008   Moderate persistent asthma without complication 01/31/2008   Diarrhea 01/31/2008   Hypothyroidism 08/11/2007   Gout 08/11/2007   Essential hypertension 08/11/2007   MYOCARDIAL INFARCTION, HX OF 08/11/2007   Coronary artery disease involving native coronary artery of native heart without angina pectoris 08/11/2007   Past Medical History:  Diagnosis Date   Allergy    Arthritis    Calculus of gallbladder without mention of cholecystitis    Cataract    bilateral cateracts removed   Celiac disease    Chronic kidney disease    stage 3   Coronary atherosclerosis of unspecified type of vessel, native or graft    Dermatitis 06/08/2021   Erythema of lower extremity 09/08/2021   Esophageal reflux    Glaucoma    History of cellulitis 09/08/2021   History of kidney stones    Loss of  weight    Lower extremity weakness 05/31/2021   Macular degeneration    Rt eye . injections   Old myocardial infarction    Osteoporosis, unspecified    Other malaise and fatigue    Other specified cardiac dysrhythmias(427.89)    Other testicular hypofunction    Pancreatic insufficiency 05/31/2021   Peripheral vascular disease (HCC)    Personal history of colonic polyps    Postural dizziness with presyncope 12/13/2021   Pure hypercholesterolemia    Thrombocytopenia (HCC)    chronic, per PCP notes dating back to 2009   Tinea pedis 05/31/2021   Unspecified asthma(493.90)    Unspecified essential hypertension    Unspecified hypothyroidism    Venous stasis dermatitis 09/08/2021    Family History  Problem Relation Age of Onset   Lymphoma Father    Hypertension Other    Vision loss Maternal Grandmother    Colon cancer Son 92   Asthma Neg Hx    Esophageal cancer Neg Hx    Rectal cancer Neg Hx    Stomach cancer Neg Hx    Colon polyps Neg Hx     Past Surgical History:  Procedure Laterality Date   ARTERY BIOPSY Left 02/18/2022   Procedure: LEFT TEMPORAL ARTERY BIOPSY;  Surgeon: Sheliah Hatch, De Blanch, MD;  Location: [facility];  Service: General;  Laterality: Left;   cataract surgery Bilateral    COLONOSCOPY  06/26/2017   Russella Dar   COLONOSCOPY WITH PROPOFOL N/A 11/27/2014   Procedure: COLONOSCOPY WITH PROPOFOL;  Surgeon: Rachael Fee, MD;  Location: [facility];  Service: Endoscopy;  Laterality: N/A;   CORONARY ANGIOPLASTY     CORONARY STENT PLACEMENT  2011   Cypher; in distal circumflex artery   CYSTOSCOPY  1998   IR URETERAL STENT LEFT NEW ACCESS W/O SEP NEPHROSTOMY CATH  06/21/2021   KNEE SURGERY     right   LEFT HEART CATH AND CORONARY ANGIOGRAPHY N/A 01/31/2020   Procedure: LEFT HEART CATH AND CORONARY ANGIOGRAPHY;  Surgeon: Lyn Records, MD;  Location: [facility] INVASIVE CV LAB;  Service: Cardiovascular;  Laterality: N/A;   NEPHROLITHOTOMY Left 06/21/2021   Procedure:  NEPHROLITHOTOMY PERCUTANEOUS;  Surgeon: Marcine Matar, MD;  Location: [facility];  Service: Urology;  Laterality: Left;  90 MINS   PERIPHERAL VASCULAR THROMBECTOMY Left 06/24/2022   Procedure: PERIPHERAL VASCULAR THROMBECTOMY;  Surgeon: Leonie Douglas, MD;  Location: [facility] INVASIVE  CV LAB;  Service: Cardiovascular;  Laterality: Left;   SHOULDER SURGERY Left 07/2013   Dr Colvin Caroli   UPPER GASTROINTESTINAL ENDOSCOPY     Social History   Occupational History   Occupation: retired    Associate Professor: RETIRED    Comment: Engineering  Tobacco Use   Smoking status: Never    Passive exposure: Never   Smokeless tobacco: Never  Vaping Use   Vaping status: Never Used  Substance and Sexual Activity   Alcohol use: No   Drug use: No   Sexual activity: Yes

## 2024-01-09 ENCOUNTER — Other Ambulatory Visit: Payer: Self-pay | Admitting: Internal Medicine

## 2024-01-10 ENCOUNTER — Other Ambulatory Visit: Payer: Self-pay | Admitting: Internal Medicine

## 2024-01-10 ENCOUNTER — Ambulatory Visit: Payer: PPO | Admitting: Internal Medicine

## 2024-01-10 ENCOUNTER — Encounter: Payer: Self-pay | Admitting: Internal Medicine

## 2024-01-10 VITALS — BP 116/68 | HR 97 | Temp 98.0°F | Wt 166.0 lb

## 2024-01-10 DIAGNOSIS — K900 Celiac disease: Secondary | ICD-10-CM | POA: Diagnosis not present

## 2024-01-10 DIAGNOSIS — G8929 Other chronic pain: Secondary | ICD-10-CM

## 2024-01-10 DIAGNOSIS — R269 Unspecified abnormalities of gait and mobility: Secondary | ICD-10-CM | POA: Diagnosis not present

## 2024-01-10 DIAGNOSIS — R29898 Other symptoms and signs involving the musculoskeletal system: Secondary | ICD-10-CM | POA: Diagnosis not present

## 2024-01-10 DIAGNOSIS — M25561 Pain in right knee: Secondary | ICD-10-CM | POA: Diagnosis not present

## 2024-01-10 DIAGNOSIS — M5442 Lumbago with sciatica, left side: Secondary | ICD-10-CM | POA: Diagnosis not present

## 2024-01-10 DIAGNOSIS — M25562 Pain in left knee: Secondary | ICD-10-CM

## 2024-01-10 LAB — COMPREHENSIVE METABOLIC PANEL
ALT: 19 U/L (ref 0–53)
AST: 27 U/L (ref 0–37)
Albumin: 4.1 g/dL (ref 3.5–5.2)
Alkaline Phosphatase: 121 U/L — ABNORMAL HIGH (ref 39–117)
BUN: 31 mg/dL — ABNORMAL HIGH (ref 6–23)
CO2: 30 meq/L (ref 19–32)
Calcium: 9.8 mg/dL (ref 8.4–10.5)
Chloride: 102 meq/L (ref 96–112)
Creatinine, Ser: 2.08 mg/dL — ABNORMAL HIGH (ref 0.40–1.50)
GFR: 29.15 mL/min — ABNORMAL LOW (ref >60.00)
Glucose, Bld: 93 mg/dL (ref 70–99)
Potassium: 4.5 meq/L (ref 3.5–5.1)
Sodium: 142 meq/L (ref 135–145)
Total Bilirubin: 0.6 mg/dL (ref 0.2–1.2)
Total Protein: 6.6 g/dL (ref 6.0–8.3)

## 2024-01-10 LAB — URINALYSIS, ROUTINE W REFLEX MICROSCOPIC
Bilirubin Urine: NEGATIVE
Hgb urine dipstick: NEGATIVE
Ketones, ur: NEGATIVE
Nitrite: NEGATIVE
Specific Gravity, Urine: 1.025 (ref 1.000–1.030)
Urine Glucose: NEGATIVE
Urobilinogen, UA: 0.2 (ref 0.0–1.0)
pH: 6 (ref 5.0–8.0)

## 2024-01-10 LAB — CBC WITH DIFFERENTIAL/PLATELET
Basophils Absolute: 0.1 10*3/uL (ref 0.0–0.1)
Basophils Relative: 0.8 % (ref 0.0–3.0)
Eosinophils Absolute: 0.2 10*3/uL (ref 0.0–0.7)
Eosinophils Relative: 2.7 % (ref 0.0–5.0)
HCT: 45.7 % (ref 39.0–52.0)
Hemoglobin: 14.5 g/dL (ref 13.0–17.0)
Lymphocytes Relative: 17.9 % (ref 12.0–46.0)
Lymphs Abs: 1.5 10*3/uL (ref 0.7–4.0)
MCHC: 31.8 g/dL (ref 30.0–36.0)
MCV: 87.1 fl (ref 78.0–100.0)
Monocytes Absolute: 0.7 10*3/uL (ref 0.1–1.0)
Monocytes Relative: 9 % (ref 3.0–12.0)
Neutro Abs: 5.7 10*3/uL (ref 1.4–7.7)
Neutrophils Relative %: 69.6 % (ref 43.0–77.0)
Platelets: 170 10*3/uL (ref 150.0–400.0)
RBC: 5.24 Mil/uL (ref 4.22–5.81)
RDW: 18.2 % — ABNORMAL HIGH (ref 11.5–15.5)
WBC: 8.1 10*3/uL (ref 4.0–10.5)

## 2024-01-10 LAB — CK: Total CK: 60 U/L (ref 7–232)

## 2024-01-10 LAB — TSH: TSH: 1.8 u[IU]/mL (ref 0.35–5.50)

## 2024-01-10 LAB — SEDIMENTATION RATE: Sed Rate: 24 mm/h — ABNORMAL HIGH (ref 0–20)

## 2024-01-10 LAB — T4, FREE: Free T4: 1.21 ng/dL (ref 0.60–1.60)

## 2024-01-10 MED ORDER — TRAMADOL HCL 50 MG PO TABS: 50.0000 mg | ORAL_TABLET | Freq: Four times a day (QID) | ORAL | 3 refills | Status: DC | PRN

## 2024-01-10 MED ORDER — PREDNISONE 10 MG PO TABS: ORAL_TABLET | ORAL | 1 refills | Status: DC

## 2024-01-10 MED ORDER — LEVOTHYROXINE SODIUM 175 MCG PO TABS: 175.0000 ug | ORAL_TABLET | Freq: Every day | ORAL | 3 refills | Status: AC

## 2024-01-10 NOTE — Assessment & Plan Note (Signed)
 Worse Hold Lipitor Prednisone 10 mg: take 4 tabs a day x 3 days; then 3 tabs a day x 4 days; then 2 tabs a day x 4 days, then 1 tab a day pc.  Stable - use Tramadol prn  Potential benefits of a long term opioids use as well as potential risks (i.e. addiction risk, apnea etc) and complications (i.e. Somnolence, constipation and others) were explained to the patient and were aknowledged.

## 2024-01-10 NOTE — Progress Notes (Signed)
 Subjective:  Patient ID: Scott Oneal, male    DOB: [birth year]  Age: 83 y.o. MRN: [id number]  CC: Follow-up (2 month f/u)   HPI Scott Oneal presents for L knee pain, it is giving out. He had a knee injection on the left and right 2 months ago: no improvement.  C/o L knee swells a lot F/u B heel ulcers - healing C/o Levothyroxine is n/a   Outpatient Medications Prior to Visit  Medication Sig Dispense Refill   acetaminophen (TYLENOL) 650 MG CR tablet Take 650 mg by mouth in the morning.     aspirin EC 81 MG tablet Take 1 tablet (81 mg total) by mouth daily. Swallow whole.     atorvastatin (LIPITOR) 40 MG tablet TAKE 1 TABLET BY MOUTH EVERY DAY 90 tablet 3   calcitRIOL (ROCALTROL) 0.25 MCG capsule TAKE 1 CAPSULE BY MOUTH EVERY DAY (Patient taking differently: Take 0.25 mcg by mouth in the morning.) 90 capsule 1   carvedilol (COREG) 6.25 MG tablet Take 1 tablet (6.25 mg total) by mouth in the morning and at bedtime. 180 tablet 3   Cholecalciferol (VITAMIN D3) 50 MCG (2000 UT) TABS Take 2,000 Units by mouth daily.     furosemide (LASIX) 40 MG tablet Take 20 mg by mouth at bedtime.     lipase/protease/amylase (CREON) 36000 UNITS CPEP capsule Take 1-2 capsules (36,000-72,000 Units total) by mouth 3 (three) times daily before meals. (Patient taking differently: Take 72,000 Units by mouth 3 (three) times daily before meals. Per patient taking 2 tablets three times a day) 300 capsule 5   Multiple Vitamins-Minerals (PRESERVISION AREDS 2) CAPS Take 1 capsule by mouth in the morning and at bedtime.     nitroGLYCERIN (NITROSTAT) 0.4 MG SL tablet Place 0.4 mg under the tongue every 5 (five) minutes as needed for chest pain.     pantoprazole (PROTONIX) 40 MG tablet Take 40 mg by mouth 2 (two) times daily before a meal.     Propylene Glycol (SYSTANE BALANCE) 0.6 % SOLN Place 1 drop into both eyes 3 (three) times daily as needed (for dryness).     Syringe/Needle, Disp, (SYRINGE 3CC/23GX1") 23G X 1" 3  ML MISC Use for IM inj q 2 wks 50 each 3   testosterone cypionate (DEPOTESTOSTERONE CYPIONATE) 200 MG/ML injection INJECT INTO MUSCLE EVERY 2 WEEKS (Patient taking differently: Inject 400 mg into the muscle every 14 (fourteen) days.) 3 mL 3   levothyroxine (SYNTHROID) 175 MCG tablet TAKE 1 TABLET BY MOUTH EVERY DAY 90 tablet 3   traMADol (ULTRAM) 50 MG tablet Take 1 tablet (50 mg total) by mouth every 6 (six) hours as needed for severe pain (pain score 7-10). 120 tablet 3   clindamycin (CLEOCIN) 300 MG capsule Take 1 capsule (300 mg total) by mouth 3 (three) times daily. (Patient not taking: Reported on 12/22/2023) 21 capsule 0   No facility-administered medications prior to visit.    ROS: Review of Systems  Constitutional:  Positive for fatigue and unexpected weight change. Negative for appetite change.  HENT:  Negative for congestion, nosebleeds, sneezing, sore throat and trouble swallowing.   Eyes:  Negative for itching and visual disturbance.  Respiratory:  Negative for cough.   Cardiovascular:  Negative for chest pain, palpitations and leg swelling.  Gastrointestinal:  Negative for abdominal distention, blood in stool, diarrhea and nausea.  Genitourinary:  Negative for frequency and hematuria.  Musculoskeletal:  Positive for arthralgias, back pain and gait problem. Negative for  joint swelling and neck pain.  Skin:  Positive for color change. Negative for rash.  Neurological:  Positive for weakness. Negative for dizziness, tremors and speech difficulty.  Hematological:  Bruises/bleeds easily.  Psychiatric/Behavioral:  Negative for agitation, dysphoric mood, sleep disturbance and suicidal ideas. The patient is not nervous/anxious.     Objective:  BP 116/68 (BP Location: Left Arm, Patient Position: Sitting, Cuff Size: Normal)   Pulse 97   Temp 98 F (36.7 C) (Oral)   Wt 166 lb (75.3 kg)   SpO2 99%   BMI 22.51 kg/m   BP Readings from Last 3 Encounters:  01/10/24 116/68   01/03/24 (!) 153/75  11/24/23 120/74    Wt Readings from Last 3 Encounters:  01/10/24 166 lb (75.3 kg)  01/03/24 178 lb (80.7 kg)  11/24/23 178 lb 9.6 oz (81 kg)    Physical Exam Constitutional:      General: He is not in acute distress.    Appearance: Normal appearance. He is well-developed. He is not toxic-appearing.     Comments: NAD  Eyes:     Conjunctiva/sclera: Conjunctivae normal.     Pupils: Pupils are equal, round, and reactive to light.  Neck:     Thyroid: No thyromegaly.     Vascular: No JVD.  Cardiovascular:     Rate and Rhythm: Normal rate and regular rhythm.     Heart sounds: Normal heart sounds. No murmur heard.    No friction rub. No gallop.  Pulmonary:     Effort: Pulmonary effort is normal. No respiratory distress.     Breath sounds: Normal breath sounds. No wheezing or rales.  Chest:     Chest wall: No tenderness.  Abdominal:     General: Bowel sounds are normal. There is no distension.     Palpations: Abdomen is soft. There is no mass.     Tenderness: There is no abdominal tenderness. There is no guarding or rebound.  Musculoskeletal:        General: No tenderness. Normal range of motion.     Cervical back: Normal range of motion.     Right lower leg: Edema present.     Left lower leg: Edema present.  Lymphadenopathy:     Cervical: No cervical adenopathy.  Skin:    General: Skin is warm and dry.     Coloration: Skin is not pale.     Findings: No rash.  Neurological:     Mental Status: He is alert and oriented to person, place, and time.     Cranial Nerves: No cranial nerve deficit.     Motor: No abnormal muscle tone.     Coordination: Coordination normal.     Gait: Gait normal.     Deep Tendon Reflexes: Reflexes are normal and symmetric.  Psychiatric:        Behavior: Behavior normal.        Thought Content: Thought content normal.        Judgment: Judgment normal.   Trace B edema on ankles L>R knee pain Ataxic, antalgic Using a  cane    A total time of 45 minutes was spent preparing to see the patient, reviewing tests, x-rays, operative reports and other medical records.  Also, obtaining history and performing comprehensive physical exam.  Additionally, counseling the patient regarding the above listed issues.   Finally, documenting clinical information in the health records, coordination of care, educating the patient. It is a complex case.   Lab Results  Component Value  Date   WBC 10.0 11/20/2023   HGB 13.9 11/20/2023   HCT 45.9 11/20/2023   PLT 83 (L) 11/20/2023   GLUCOSE 90 11/20/2023   CHOL 143 06/25/2022   TRIG 88 06/25/2022   HDL 42 06/25/2022   LDLCALC 83 06/25/2022   ALT 26 07/24/2023   AST 31 07/24/2023   NA 140 11/20/2023   K 4.3 11/20/2023   CL 100 11/20/2023   CREATININE 2.58 (H) 11/20/2023   BUN 68 (H) 11/20/2023   CO2 28 11/20/2023   TSH 2.89 04/06/2023   PSA 2.58 07/05/2022   INR 1.3 (H) 06/23/2022   HGBA1C 6.4 04/06/2023   MICROALBUR 1.2 05/18/2017    VAS [place] LOWER EXTREMITY VENOUS (DVT) Result Date: 01/03/2024  Lower Venous DVT Study Patient Name:  JAVIS ABBOUD  Date of Exam:   01/03/2024 Medical Rec #: [id number]      Accession #:    [id number] Date of Birth: 1941/09/02     Patient Gender: M Patient Age:   41 years Exam Location:  [facility] Procedure:      VAS [place] LOWER EXTREMITY VENOUS (DVT) Referring Phys: Lemar Livings --------------------------------------------------------------------------------  Indications: Leg pain and swelling. Triage appt.  Risk Factors: DVT Hx Chronic DVT Left CFV-PTV/Peroneal veins 06/24/22 Left mechanical thrombectomy Left common iliac vein, external iliac vein, common femoral vein, and left femoral vein. Performing Technologist: Dorthula Matas RVS, RCS  Examination Guidelines: A complete evaluation includes B-mode imaging, spectral Doppler, color Doppler, and power Doppler as needed of all accessible portions of each vessel. Bilateral testing  is considered an integral part of a complete examination. Limited examinations for reoccurring indications may be performed as noted. The reflux portion of the exam is performed with the patient in reverse Trendelenburg.  +---------+---------------+---------+-----------+----------+--------------+ LEFT     CompressibilityPhasicitySpontaneityPropertiesThrombus Aging +---------+---------------+---------+-----------+----------+--------------+ CFV      Full                                                        +---------+---------------+---------+-----------+----------+--------------+ SFJ      Full                                                        +---------+---------------+---------+-----------+----------+--------------+ FV Prox  Partial                                      Chronic        +---------+---------------+---------+-----------+----------+--------------+ FV Mid   Partial                                      Chronic        +---------+---------------+---------+-----------+----------+--------------+ FV DistalPartial                                      Chronic        +---------+---------------+---------+-----------+----------+--------------+ POP      Partial  Chronic        +---------+---------------+---------+-----------+----------+--------------+ PTV      Partial                                      Chronic        +---------+---------------+---------+-----------+----------+--------------+ PERO     Partial                                      Chronic        +---------+---------------+---------+-----------+----------+--------------+ GSV      Full                                                        +---------+---------------+---------+-----------+----------+--------------+     Summary: RIGHT: - No evidence of common femoral vein obstruction.   LEFT: - Findings consistent with chronic deep vein  thrombosis involving the left femoral vein, left popliteal vein, left posterior tibial veins, and left peroneal veins.  Essentially unchanged since last exam 11/08/2023  *See table(s) above for measurements and observations. Electronically signed by Lemar Livings MD on 01/03/2024 at 5:00:13 PM.    Final     Assessment & Plan:   Problem List Items Addressed This Visit     Celiac disease   Resume strict gluten free diet      Relevant Orders   CBC with Differential/Platelet   Comprehensive metabolic panel   CK   T4, free   TSH   Urinalysis   Sedimentation rate   Gait disorder   Leg weakness Hold Lipitor Check labs  Use a walker      Knee pain, bilateral   Worse L>>R Blue-Emu cream was recommended to use 2-3 times a day  Hold Lipitor Prednisone 10 mg: take 4 tabs a day x 3 days; then 3 tabs a day x 4 days; then 2 tabs a day x 4 days, then 1 tab a day pc. Use Tramadol prn  Potential benefits of a long term opioids use as well as potential risks (i.e. addiction risk, apnea etc) and complications (i.e. Somnolence, constipation and others) were explained to the patient and were aknowledged.      Relevant Orders   CBC with Differential/Platelet   Comprehensive metabolic panel   CK   T4, free   TSH   Urinalysis   Sedimentation rate   Low back pain   He is not a surgical candidate for any back surgery per Dr. Shon Baton and Dr. Yevette Edwards He has had epidural injections by Dr. Drucie Ip Low back pain -  - severe 7-8/10 all the time. Oxycodone - it stopped working the other day and [name] stopped taking it.  The pain is worse.  He will take Percocet 10/325 up to 4 times a day.  Potential benefits of a long term opioids use as well as potential risks (i.e. addiction risk, apnea etc) and complications (i.e. Somnolence, constipation and others) were explained to the patient and were aknowledged.  Stable - use Tramadol prn  Potential benefits of a long term opioids use as well as potential risks  (i.e. addiction risk, apnea etc) and complications (i.e. Somnolence, constipation and others) were explained to the patient  and were aknowledged.      Relevant Medications   predniSONE (DELTASONE) 10 MG tablet   traMADol (ULTRAM) 50 MG tablet   Other Relevant Orders   Urinalysis   Lower extremity weakness - Primary   Worse Hold Lipitor Prednisone 10 mg: take 4 tabs a day x 3 days; then 3 tabs a day x 4 days; then 2 tabs a day x 4 days, then 1 tab a day pc.  Stable - use Tramadol prn  Potential benefits of a long term opioids use as well as potential risks (i.e. addiction risk, apnea etc) and complications (i.e. Somnolence, constipation and others) were explained to the patient and were aknowledged.      Relevant Orders   CBC with Differential/Platelet   Comprehensive metabolic panel   CK   T4, free   TSH   Urinalysis   Sedimentation rate      Meds ordered this encounter  Medications   predniSONE (DELTASONE) 10 MG tablet    Sig: Prednisone 10 mg: take 4 tabs a day x 3 days; then 3 tabs a day x 4 days; then 2 tabs a day x 4 days, then 1 tab a day. Take pc.    Dispense:  100 tablet    Refill:  1   traMADol (ULTRAM) 50 MG tablet    Sig: Take 1 tablet (50 mg total) by mouth every 6 (six) hours as needed for severe pain (pain score 7-10).    Dispense:  120 tablet    Refill:  3   levothyroxine (SYNTHROID) 175 MCG tablet    Sig: Take 1 tablet (175 mcg total) by mouth daily.    Dispense:  90 tablet    Refill:  3      Follow-up: Return in about 1 week (around 01/17/2024) for a follow-up visit.  Sonda Primes, MD

## 2024-01-10 NOTE — Assessment & Plan Note (Addendum)
 Worse L>>R Blue-Emu cream was recommended to use 2-3 times a day  Hold Lipitor Prednisone 10 mg: take 4 tabs a day x 3 days; then 3 tabs a day x 4 days; then 2 tabs a day x 4 days, then 1 tab a day pc. Use Tramadol prn  Potential benefits of a long term opioids use as well as potential risks (i.e. addiction risk, apnea etc) and complications (i.e. Somnolence, constipation and others) were explained to the patient and were aknowledged.

## 2024-01-10 NOTE — Assessment & Plan Note (Signed)
Resume strict gluten-free diet

## 2024-01-10 NOTE — Assessment & Plan Note (Signed)
 He is not a surgical candidate for any back surgery per Dr. Shon Baton and Dr. Yevette Edwards He has had epidural injections by Dr. Drucie Ip Low back pain -  - severe 7-8/10 all the time. Oxycodone - it stopped working the other day and [name] stopped taking it.  The pain is worse.  He will take Percocet 10/325 up to 4 times a day.  Potential benefits of a long term opioids use as well as potential risks (i.e. addiction risk, apnea etc) and complications (i.e. Somnolence, constipation and others) were explained to the patient and were aknowledged.  Stable - use Tramadol prn  Potential benefits of a long term opioids use as well as potential risks (i.e. addiction risk, apnea etc) and complications (i.e. Somnolence, constipation and others) were explained to the patient and were aknowledged.

## 2024-01-10 NOTE — Patient Instructions (Addendum)
  Hold Lipitor  Prednisone 10 mg: take 4 tabs a day x 3 days; then 3 tabs a day x 4 days; then 2 tabs a day x 4 days, then 1 tab a day pc. Use Tramadol prn  Use a knee brace  Blue-Emu cream-- use 2-3 times a day

## 2024-01-10 NOTE — Assessment & Plan Note (Signed)
 Leg weakness Hold Lipitor Check labs  Use a walker

## 2024-01-12 ENCOUNTER — Encounter: Payer: Self-pay | Admitting: Internal Medicine

## 2024-01-12 ENCOUNTER — Ambulatory Visit (INDEPENDENT_AMBULATORY_CARE_PROVIDER_SITE_OTHER): Payer: PPO | Admitting: Podiatry

## 2024-01-12 DIAGNOSIS — B351 Tinea unguium: Secondary | ICD-10-CM

## 2024-01-12 DIAGNOSIS — L97422 Non-pressure chronic ulcer of left heel and midfoot with fat layer exposed: Secondary | ICD-10-CM

## 2024-01-12 DIAGNOSIS — L97522 Non-pressure chronic ulcer of other part of left foot with fat layer exposed: Secondary | ICD-10-CM

## 2024-01-12 DIAGNOSIS — M79674 Pain in right toe(s): Secondary | ICD-10-CM | POA: Diagnosis not present

## 2024-01-12 DIAGNOSIS — M79675 Pain in left toe(s): Secondary | ICD-10-CM | POA: Diagnosis not present

## 2024-01-15 ENCOUNTER — Other Ambulatory Visit: Payer: Self-pay | Admitting: Internal Medicine

## 2024-01-15 NOTE — Progress Notes (Signed)
 Subjective: Chief Complaint  Patient presents with   Foot Ulcer    RM#11 ulcer bilateral heels 2 week follow up no drainage swelling present. Big toe left foot having discomfort in the nail.    83 year old male presents after the above concerns.  He states the heels do feel better but they are still having discomfort.  No drainage or pus increasing swelling or redness.  He does follow with vascular surgery still saw him last as well.  He is also asking for his nails be trimmed today still thickened elongated he has difficulty doing them no causing discomfort.  No new ulcerations.  No injuries or any other concerns today.   Objective: AAO x3, NAD DP/PT pulses palpable bilaterally, CRT less than 3 seconds To the posterior aspect the right heel appears that the wound has healed.  The left side there is still an ulcer but appears more of a superficial skin fissure.  Overall the wound is doing better although possibly healing.  There is no surrounding erythema, ascending cellulitis.  No drainage or pus or any signs of infection.  No new ulcerations at this time. Nails are hypertrophic, dystrophic, brittle, discolored, elongated 10. No surrounding redness or drainage. Tenderness nails 1-5 bilaterally.  No pain with calf compression, swelling, warmth, erythema  Assessment: Bilateral heel ulcerations; symptomatic onychosis  Plan: Bilateral heel ulcerations -Clean the wounds today.  Vitasorb is applied followed by dressing. -Will continue with iodosorb dressing changes for now.  Continue with offloading at all times.  -Continue offloading, floating the heels. -Discussed wearing open back shoe to avoid any pressure. -Monitor for any clinical signs or symptoms of infection and directed to call the office immediately should any occur or go to the ER.  Symptomatic onychomycosis -Sharply debrided toenails x 10 without any complications or bleeding.  Vivi Barrack DPM

## 2024-01-16 ENCOUNTER — Telehealth: Payer: Self-pay

## 2024-01-16 NOTE — Telephone Encounter (Signed)
 I agree.  [place] should take his parents to look at several Senior living places in the area.  They usually have different levels of care depending on the residents' needs. Thanks

## 2024-01-16 NOTE — Telephone Encounter (Unsigned)
 Copied from CRM [id number]. Topic: General - Other >> Jan 16, 2024  4:23 PM Almira Coaster wrote: Reason for CRM: Patient's daughter in law is calling regarding concerns the family has regarding the patient living conditions, the son would like for him to be in an assistance living facility but patient does not agree. They would like to know what they can do to help smooth the process for the patient. Patient's son is [place] is [phone].

## 2024-01-17 NOTE — Telephone Encounter (Signed)
 LVM for pts son with details of the following per the provider " I agree.  [place] should take his parents to look at several Senior living places in the area.  They usually have different levels of care depending on the residents' needs. Thanks"

## 2024-01-18 ENCOUNTER — Ambulatory Visit: Admitting: Internal Medicine

## 2024-01-22 DIAGNOSIS — M170 Bilateral primary osteoarthritis of knee: Secondary | ICD-10-CM | POA: Diagnosis not present

## 2024-01-25 ENCOUNTER — Ambulatory Visit: Payer: PPO

## 2024-01-25 VITALS — Ht 72.0 in | Wt 166.0 lb

## 2024-01-25 DIAGNOSIS — Z0000 Encounter for general adult medical examination without abnormal findings: Secondary | ICD-10-CM

## 2024-01-25 NOTE — Progress Notes (Addendum)
 Subjective:   Scott Oneal is a 83 y.o. who presents for a Medicare Wellness preventive visit.  Visit Complete: Virtual I connected with  Scott Oneal on 01/25/24 by a audio enabled telemedicine application and verified that I am speaking with the correct person using two identifiers.  Patient Location: Home  Provider Location: Office/Clinic  I discussed the limitations of evaluation and management by telemedicine. The patient expressed understanding and agreed to proceed.  Vital Signs: Because this visit was a virtual/telehealth visit, some criteria may be missing or patient reported. Any vitals not documented were not able to be obtained and vitals that have been documented are patient reported.  VideoError- Librarian, academic were attempted between this provider and patient, however failed, due to patient having technical difficulties OR patient did not have access to video capability.  We continued and completed visit with audio only.   Persons Participating in Visit: Patient.  AWV Questionnaire: No: Patient Medicare AWV questionnaire was not completed prior to this visit.  Cardiac Risk Factors include: advanced age (>9men, >58 women);dyslipidemia;hypertension;male gender     Objective:    Today's Vitals   01/25/24 1523  Weight: 166 lb (75.3 kg)  Height: 6' (1.829 m)   Body mass index is 22.51 kg/m.     01/25/2024    3:22 PM 11/20/2023    3:39 PM 07/06/2022    3:06 PM 06/23/2022    8:32 PM 02/10/2022    8:52 AM 06/21/2021    8:49 AM 06/16/2021    1:13 PM  Advanced Directives  Does Patient Have a Medical Advance Directive? Yes No Yes No Yes No Yes  Type of Estate agent of [place];Living will  Healthcare Power of [place];Living will  Healthcare Power of [place];Living will  Healthcare Power of [place];Living will  Copy of Healthcare Power of Attorney in Chart? No - copy requested  No - copy requested    No - copy  requested  Would patient like information on creating a medical advance directive?    Yes (ED - Information included in AVS)  No - Patient declined     Current Medications (verified) Outpatient Encounter Medications as of 01/25/2024  Medication Sig   acetaminophen (TYLENOL) 650 MG CR tablet Take 650 mg by mouth in the morning.   aspirin EC 81 MG tablet Take 1 tablet (81 mg total) by mouth daily. Swallow whole.   atorvastatin (LIPITOR) 40 MG tablet TAKE 1 TABLET BY MOUTH EVERY DAY   calcitRIOL (ROCALTROL) 0.25 MCG capsule TAKE 1 CAPSULE BY MOUTH EVERY DAY (Patient taking differently: Take 0.25 mcg by mouth in the morning.)   carvedilol (COREG) 6.25 MG tablet Take 1 tablet (6.25 mg total) by mouth in the morning and at bedtime.   Cholecalciferol (VITAMIN D3) 50 MCG (2000 UT) TABS Take 2,000 Units by mouth daily.   clindamycin (CLEOCIN) 300 MG capsule Take 1 capsule (300 mg total) by mouth 3 (three) times daily.   furosemide (LASIX) 40 MG tablet Take 20 mg by mouth at bedtime.   levothyroxine (SYNTHROID) 175 MCG tablet Take 1 tablet (175 mcg total) by mouth daily.   lipase/protease/amylase (CREON) 36000 UNITS CPEP capsule Take 1-2 capsules (36,000-72,000 Units total) by mouth 3 (three) times daily before meals. (Patient taking differently: Take 72,000 Units by mouth 3 (three) times daily before meals. Per patient taking 2 tablets three times a day)   Multiple Vitamins-Minerals (PRESERVISION AREDS 2) CAPS Take 1 capsule by mouth in the  morning and at bedtime.   nitroGLYCERIN (NITROSTAT) 0.4 MG SL tablet Place 0.4 mg under the tongue every 5 (five) minutes as needed for chest pain.   pantoprazole (PROTONIX) 40 MG tablet TAKE 1 TABLET BY MOUTH TWICE A DAY   predniSONE (DELTASONE) 10 MG tablet Prednisone 10 mg: take 4 tabs a day x 3 days; then 3 tabs a day x 4 days; then 2 tabs a day x 4 days, then 1 tab a day. Take pc.   Propylene Glycol (SYSTANE BALANCE) 0.6 % SOLN Place 1 drop into both eyes 3  (three) times daily as needed (for dryness).   Syringe/Needle, Disp, (SYRINGE 3CC/23GX1") 23G X 1" 3 ML MISC Use for IM inj q 2 wks   testosterone cypionate (DEPOTESTOSTERONE CYPIONATE) 200 MG/ML injection INJECT INTO MUSCLE EVERY 2 WEEKS (Patient taking differently: Inject 400 mg into the muscle every 14 (fourteen) days.)   traMADol (ULTRAM) 50 MG tablet Take 1 tablet (50 mg total) by mouth every 6 (six) hours as needed for severe pain (pain score 7-10).   [DISCONTINUED] pantoprazole (PROTONIX) 40 MG tablet Take 40 mg by mouth 2 (two) times daily before a meal.   No facility-administered encounter medications on file as of 01/25/2024.    Allergies (verified) Biaxin [clarithromycin], Tizanidine, Allantoin-pramoxine, Amlodipine, Amoxicillin, Benzalkonium chloride, Doxycycline, and Metoprolol tartrate   History: Past Medical History:  Diagnosis Date   Allergy    Arthritis    Calculus of gallbladder without mention of cholecystitis    Cataract    bilateral cateracts removed   Celiac disease    Chronic kidney disease    stage 3   Coronary atherosclerosis of unspecified type of vessel, native or graft    Dermatitis 06/08/2021   Erythema of lower extremity 09/08/2021   Esophageal reflux    Glaucoma    History of cellulitis 09/08/2021   History of kidney stones    Loss of weight    Lower extremity weakness 05/31/2021   Macular degeneration    Rt eye . injections   Old myocardial infarction    Osteoporosis, unspecified    Other malaise and fatigue    Other specified cardiac dysrhythmias(427.89)    Other testicular hypofunction    Pancreatic insufficiency 05/31/2021   Peripheral vascular disease (HCC)    Personal history of colonic polyps    Postural dizziness with presyncope 12/13/2021   Pure hypercholesterolemia    Thrombocytopenia (HCC)    chronic, per PCP notes dating back to 2009   Tinea pedis 05/31/2021   Unspecified asthma(493.90)    Unspecified essential hypertension     Unspecified hypothyroidism    Venous stasis dermatitis 09/08/2021   Past Surgical History:  Procedure Laterality Date   ARTERY BIOPSY Left 02/18/2022   Procedure: LEFT TEMPORAL ARTERY BIOPSY;  Surgeon: Sheliah Hatch De Blanch, MD;  Location: [facility];  Service: General;  Laterality: Left;   cataract surgery Bilateral    COLONOSCOPY  06/26/2017   Russella Dar   COLONOSCOPY WITH PROPOFOL N/A 11/27/2014   Procedure: COLONOSCOPY WITH PROPOFOL;  Surgeon: Rachael Fee, MD;  Location: [facility];  Service: Endoscopy;  Laterality: N/A;   CORONARY ANGIOPLASTY     CORONARY STENT PLACEMENT  2011   Cypher; in distal circumflex artery   CYSTOSCOPY  1998   IR URETERAL STENT LEFT NEW ACCESS W/O SEP NEPHROSTOMY CATH  06/21/2021   KNEE SURGERY     right   LEFT HEART CATH AND CORONARY ANGIOGRAPHY N/A 01/31/2020   Procedure: LEFT HEART  CATH AND CORONARY ANGIOGRAPHY;  Surgeon: Lyn Records, MD;  Location: [facility] INVASIVE CV LAB;  Service: Cardiovascular;  Laterality: N/A;   NEPHROLITHOTOMY Left 06/21/2021   Procedure: NEPHROLITHOTOMY PERCUTANEOUS;  Surgeon: Marcine Matar, MD;  Location: [facility];  Service: Urology;  Laterality: Left;  90 MINS   PERIPHERAL VASCULAR THROMBECTOMY Left 06/24/2022   Procedure: PERIPHERAL VASCULAR THROMBECTOMY;  Surgeon: Leonie Douglas, MD;  Location: [facility] INVASIVE CV LAB;  Service: Cardiovascular;  Laterality: Left;   SHOULDER SURGERY Left 07/2013   Dr Colvin Caroli   UPPER GASTROINTESTINAL ENDOSCOPY     Family History  Problem Relation Age of Onset   Lymphoma Father    Hypertension Other    Vision loss Maternal Grandmother    Colon cancer Son 41   Asthma Neg Hx    Esophageal cancer Neg Hx    Rectal cancer Neg Hx    Stomach cancer Neg Hx    Colon polyps Neg Hx    Social History   Socioeconomic History   Marital status: Married    Spouse name: Eliyahu Bille   Number of children: 3   Years of education: Not on file   Highest education level: Not on file  Occupational  History   Occupation: retired    Associate Professor: RETIRED    Comment: Engineering  Tobacco Use   Smoking status: Never    Passive exposure: Never   Smokeless tobacco: Never  Vaping Use   Vaping status: Never Used  Substance and Sexual Activity   Alcohol use: No   Drug use: No   Sexual activity: Yes  Other Topics Concern   Not on file  Social History Narrative   Retired.   Regular Exercise-Yes; yoga & silver sneakers   Daily Caffeine Use.            Social Drivers of Corporate investment banker Strain: Low Risk  (01/25/2024)   Overall Financial Resource Strain (CARDIA)    Difficulty of Paying Living Expenses: Not hard at all  Food Insecurity: No Food Insecurity (01/25/2024)   Hunger Vital Sign    Worried About Running Out of Food in the Last Year: Never true    Ran Out of Food in the Last Year: Never true  Transportation Needs: No Transportation Needs (01/25/2024)   PRAPARE - Administrator, Civil Service (Medical): No    Lack of Transportation (Non-Medical): No  Physical Activity: Inactive (01/25/2024)   Exercise Vital Sign    Days of Exercise per Week: 0 days    Minutes of Exercise per Session: 0 min  Stress: No Stress Concern Present (01/25/2024)   [employer] of Occupational Health - Occupational Stress Questionnaire    Feeling of Stress : Not at all  Social Connections: Moderately Isolated (01/25/2024)   Social Connection and Isolation Panel [NHANES]    Frequency of Communication with Friends and Family: More than three times a week    Frequency of Social Gatherings with Friends and Family: Not on file    Attends Religious Services: Never    Database administrator or Organizations: No    Attends Banker Meetings: Never    Marital Status: Married    Tobacco Counseling Counseling given: Not Answered    Clinical Intake:  Pre-visit preparation completed: Yes  Pain : No/denies pain     BMI - recorded: 22.51 Nutritional Status: BMI of  19-24  Normal Nutritional Risks: None Diabetes: No  Lab Results  Component Value Date  HGBA1C 6.4 04/06/2023   HGBA1C 6.0 12/28/2021   HGBA1C 5.9 03/15/2016     How often do you need to have someone help you when you read instructions, pamphlets, or other written materials from your doctor or pharmacy?: 1 - Never  Interpreter Needed?: No  Information entered by :: Hassell Halim, CMA   Activities of Daily Living     01/25/2024    3:39 PM  In your present state of health, do you have any difficulty performing the following activities:  Hearing? 0  Vision? 0  Difficulty concentrating or making decisions? 0  Walking or climbing stairs? 0  Dressing or bathing? 0  Doing errands, shopping? 0  Preparing Food and eating ? N  Using the Toilet? N  In the past six months, have you accidently leaked urine? N  Do you have problems with loss of bowel control? N  Managing your Medications? N  Managing your Finances? N  Housekeeping or managing your Housekeeping? N    Patient Care Team: Plotnikov, Georgina Quint, MD as PCP - Jerelene Redden, MD as PCP - Cardiology (Cardiology) Storm Frisk, MD as Consulting Physician (Pulmonary Disease) Meryl Dare, MD (Inactive) as Consulting Physician (Gastroenterology) Marcine Matar, MD as Consulting Physician (Urology) Annie Sable, MD as Consulting Physician (Nephrology) Tonny Bollman, MD as Consulting Physician (Cardiology) Gibson Ramp, MD as Consulting Physician (Ophthalmology) Daiva Eves, Lisette Grinder, MD as Consulting Physician (Infectious Diseases) Jenene Slicker Deberah Castle, MD as Consulting Physician (Ophthalmology) Randa Spike Kelton Pillar, LCSW as [facility] Care Management (Licensed Clinical Social Worker) Chuck Hint, MD (Inactive) as Consulting Physician (Vascular Surgery) Windle Guard, MD as Consulting Physician (Anesthesiology)  Indicate any recent Medical Services you may have  received from other than [facility] providers in the past year (date may be approximate).     Assessment:   This is a routine wellness examination for [place].  Hearing/Vision screen Hearing Screening - Comments:: Denies hearing difficulties   Vision Screening - Comments:: Wears rx glasses - up to date with routine eye exams with Dr Jenene Slicker   Goals Addressed               This Visit's Progress     Patient Stated (pt-stated)        Patient stated he plans to stay active and stay alive.       Depression Screen     01/25/2024    3:42 PM 09/05/2023    2:46 PM 07/25/2023    2:36 PM 04/06/2023    2:49 PM 12/26/2022    4:16 PM 07/06/2022    3:12 PM 06/13/2022   10:53 AM  PHQ 2/9 Scores  PHQ - 2 Score 0 0 0 0 0 0 0  PHQ- 9 Score 0          Fall Risk     01/25/2024    3:46 PM 01/10/2024    1:27 PM 09/05/2023    2:46 PM 07/25/2023    2:36 PM 04/06/2023    2:49 PM  Fall Risk   Falls in the past year? 1 1 1 1 1   Number falls in past yr: 1 0 1 1 1   Comment 2      Injury with Fall? 0 1 1 1 1   Risk for fall due to : Impaired balance/gait  Impaired balance/gait Impaired balance/gait Impaired balance/gait;History of fall(s)  Follow up Falls evaluation completed;Falls prevention discussed Falls evaluation completed Falls evaluation completed Falls evaluation completed Falls evaluation  completed    MEDICARE RISK AT HOME:  Medicare Risk at Home Any stairs in or around the home?: Yes If so, are there any without handrails?: No Home free of loose throw rugs in walkways, pet beds, electrical cords, etc?: Yes Adequate lighting in your home to reduce risk of falls?: Yes Life alert?: No Use of a cane, walker or w/c?: No Grab bars in the bathroom?: Yes Shower chair or bench in shower?: Yes Elevated toilet seat or a handicapped toilet?: Yes  TIMED UP AND GO:  Was the test performed?  No  Cognitive Function: 6CIT completed    03/06/2018    9:56 AM  MMSE - Mini Mental State Exam  Orientation to  time 5  Orientation to Place 5  Registration 3  Attention/ Calculation 5  Recall 2  Language- name 2 objects 2  Language- repeat 1  Language- follow 3 step command 3  Language- read & follow direction 1  Write a sentence 1  Copy design 1  Total score 29        01/25/2024    3:46 PM 07/06/2022    3:22 PM 04/20/2020    3:13 PM  6CIT Screen  What Year? 0 points 0 points 0 points  What month? 0 points 0 points 0 points  What time? 0 points 0 points 0 points  Count back from 20 0 points 0 points 0 points  Months in reverse 0 points 0 points 0 points  Repeat phrase 0 points 0 points 0 points  Total Score 0 points 0 points 0 points    Immunizations Immunization History  Administered Date(s) Administered   Fluad Quad(high Dose 65+) 07/18/2019, 07/28/2020, 07/21/2021, 06/30/2022   Fluad Trivalent(High Dose 65+) 07/29/2023   Influenza Split 07/27/2011, 08/07/2012   Influenza Whole 10/04/2002, 07/30/2008, 08/12/2009, 07/07/2010   Influenza, High Dose Seasonal PF 08/07/2015, 08/01/2016, 07/20/2017, 06/29/2018   Influenza,inj,Quad PF,6+ Mos 07/17/2013, 07/21/2014   Influenza-Unspecified 06/29/2018   Moderna Covid-19 Fall Seasonal Vaccine 11yrs & older 07/29/2023   PFIZER Comirnaty(Gray Top)Covid-19 Tri-Sucrose Vaccine 02/18/2021   PFIZER(Purple Top)SARS-COV-2 Vaccination 11/20/2019, 12/11/2019, 08/17/2020   PNEUMOCOCCAL CONJUGATE-20 03/30/2022, 07/22/2022   Pfizer Covid-19 Vaccine Bivalent Booster 50yrs & up 07/22/2021   Pfizer(Comirnaty)Fall Seasonal Vaccine 12 years and older 07/21/2022   Pneumococcal Conjugate-13 09/30/2013   Pneumococcal Polysaccharide-23 05/15/2006, 12/12/2012   Respiratory Syncytial Virus Vaccine,Recomb Aduvanted(Arexvy) 07/22/2022   Td 11/24/2010   Td (Adult), 2 Lf Tetanus Toxid, Preservative Free 11/24/2010   Tdap 12/28/2021   Unspecified SARS-COV-2 Vaccination 07/21/2022   Zoster Recombinant(Shingrix) 03/02/2017, 06/08/2017   Zoster, Live 07/11/2006     Screening Tests Health Maintenance  Topic Date Due   COVID-19 Vaccine (8 - Pfizer risk 2024-25 season) 01/26/2024   Medicare Annual Wellness (AWV)  01/24/2025   DTaP/Tdap/Td (3 - Td or Tdap) 12/29/2031   Pneumonia Vaccine 18+ Years old  Completed   INFLUENZA VACCINE  Completed   Zoster Vaccines- Shingrix  Completed   HPV VACCINES  Aged Out   Colonoscopy  Discontinued    Health Maintenance  Health Maintenance Due  Topic Date Due   COVID-19 Vaccine (8 - Pfizer risk 2024-25 season) 01/26/2024   Health Maintenance Items Addressed: 01/25/2024   Additional Screening:  Vision Screening: Recommended annual ophthalmology exams for early detection of glaucoma and other disorders of the eye.  Dental Screening: Recommended annual dental exams for proper oral hygiene  Community Resource Referral / Chronic Care Management: CRR required this visit?  No   CCM required this  visit?  No     Plan:     I have personally reviewed and noted the following in the patient's chart:   Medical and social history Use of alcohol, tobacco or illicit drugs  Current medications and supplements including opioid prescriptions. Patient is currently taking opioid prescriptions. Information provided to patient regarding non-opioid alternatives. Patient advised to discuss non-opioid treatment plan with their provider. Functional ability and status Nutritional status Physical activity Advanced directives List of other physicians Hospitalizations, surgeries, and ER visits in previous 12 months Vitals Screenings to include cognitive, depression, and falls Referrals and appointments  In addition, I have reviewed and discussed with patient certain preventive protocols, quality metrics, and best practice recommendations. A written personalized care plan for preventive services as well as general preventive health recommendations were provided to patient.    Darreld Mclean, CMA   01/25/2024   After  Visit Summary: (MyChart) Due to this being a telephonic visit, the after visit summary with patients personalized plan was offered to patient via MyChart   Notes: Nothing significant to report at this time.  Medical screening examination/treatment/procedure(s) were performed by non-physician practitioner and as supervising physician I was immediately available for consultation/collaboration.  I agree with above. Jacinta Shoe, MD

## 2024-01-25 NOTE — Patient Instructions (Signed)
 [name] , Thank you for taking time to come for your Medicare Wellness Visit. I appreciate your ongoing commitment to your health goals. Please review the following plan we discussed and let me know if I can assist you in the future.   Referrals/Orders/Follow-Ups/Clinician Recommendations: Aim for 30 minutes of exercise or brisk walking, 6-8 glasses of water, and 5 servings of fruits and vegetables each day.   This is a list of the screening recommended for you and due dates:  Health Maintenance  Topic Date Due   COVID-19 Vaccine (8 - Pfizer risk 2024-25 season) 01/26/2024   Medicare Annual Wellness Visit  01/24/2025   DTaP/Tdap/Td vaccine (3 - Td or Tdap) 12/29/2031   Pneumonia Vaccine  Completed   Flu Shot  Completed   Zoster (Shingles) Vaccine  Completed   HPV Vaccine  Aged Out   Colon Cancer Screening  Discontinued    Advanced directives: (Copy Requested) Please bring a copy of your health care power of attorney and living will to the office to be added to your chart at your convenience. You can mail to [facility] [address] [place], [place] [address] or email to [email or web address]  Next Medicare Annual Wellness Visit scheduled for next year: Yes  Managing Pain Without Opioids Opioids are strong medicines used to treat moderate to severe pain. For some people, especially those who have long-term (chronic) pain, opioids may not be the best choice for pain management due to: Side effects like nausea, constipation, and sleepiness. The risk of addiction (opioid use disorder). The longer you take opioids, the greater your risk of addiction. Pain that lasts for more than 3 months is called chronic pain. Managing chronic pain usually requires more than one approach and is often provided by a team of health care providers working together (multidisciplinary approach). Pain management may be done at a pain management center or pain clinic. How to manage pain  without the use of opioids Use non-opioid medicines Non-opioid medicines for pain may include: Over-the-counter or prescription non-steroidal anti-inflammatory drugs (NSAIDs). These may be the first medicines used for pain. They work well for muscle and bone pain, and they reduce swelling. Acetaminophen. This over-the-counter medicine may work well for milder pain but not swelling. Antidepressants. These may be used to treat chronic pain. A certain type of antidepressant (tricyclics) is often used. These medicines are given in lower doses for pain than when used for depression. Anticonvulsants. These are usually used to treat seizures but may also reduce nerve (neuropathic) pain. Muscle relaxants. These relieve pain caused by sudden muscle tightening (spasms). You may also use a pain medicine that is applied to the skin as a patch, cream, or gel (topical analgesic), such as a numbing medicine. These may cause fewer side effects than medicines taken by mouth. Do certain therapies as directed Some therapies can help with pain management. They include: Physical therapy. You will do exercises to gain strength and flexibility. A physical therapist may teach you exercises to move and stretch parts of your body that are weak, stiff, or painful. You can learn these exercises at physical therapy visits and practice them at home. Physical therapy may also involve: Massage. Heat wraps or applying heat or cold to affected areas. Electrical signals that interrupt pain signals (transcutaneous electrical nerve stimulation, TENS). Weak lasers that reduce pain and swelling (low-level laser therapy). Signals from your body that help you learn to regulate pain (biofeedback). Occupational therapy. This helps you to learn  ways to function at home and work with less pain. Recreational therapy. This involves trying new activities or hobbies, such as a physical activity or drawing. Mental health therapy,  including: Cognitive behavioral therapy (CBT). This helps you learn coping skills for dealing with pain. Acceptance and commitment therapy (ACT) to change the way you think and react to pain. Relaxation therapies, including muscle relaxation exercises and mindfulness-based stress reduction. Pain management counseling. This may be individual, family, or group counseling.  Receive medical treatments Medical treatments for pain management include: Nerve block injections. These may include a pain blocker and anti-inflammatory medicines. You may have injections: Near the spine to relieve chronic back or neck pain. Into joints to relieve back or joint pain. Into nerve areas that supply a painful area to relieve body pain. Into muscles (trigger point injections) to relieve some painful muscle conditions. A medical device placed near your spine to help block pain signals and relieve nerve pain or chronic back pain (spinal cord stimulation device). Acupuncture. Follow these instructions at home Medicines Take over-the-counter and prescription medicines only as told by your health care provider. If you are taking pain medicine, ask your health care providers about possible side effects to watch out for. Do not drive or use heavy machinery while taking prescription opioid pain medicine. Lifestyle  Do not use drugs or alcohol to reduce pain. If you drink alcohol, limit how much you have to: 0-1 drink a day for women who are not pregnant. 0-2 drinks a day for men. Know how much alcohol is in a drink. In the [place] one drink equals one 12 oz bottle of beer (355 mL), one 5 oz glass of wine (148 mL), or one 1 oz glass of hard liquor (44 mL). Do not use any products that contain nicotine or tobacco. These products include cigarettes, chewing tobacco, and vaping devices, such as e-cigarettes. If you need help quitting, ask your health care provider. Eat a healthy diet and maintain a healthy weight. Poor  diet and excess weight may make pain worse. Eat foods that are high in fiber. These include fresh fruits and vegetables, whole grains, and beans. Limit foods that are high in fat and processed sugars, such as fried and sweet foods. Exercise regularly. Exercise lowers stress and may help relieve pain. Ask your health care provider what activities and exercises are safe for you. If your health care provider approves, join an exercise class that combines movement and stress reduction. Examples include yoga and tai chi. Get enough sleep. Lack of sleep may make pain worse. Lower stress as much as possible. Practice stress reduction techniques as told by your therapist. General instructions Work with all your pain management providers to find the treatments that work best for you. You are an important member of your pain management team. There are many things you can do to reduce pain on your own. Consider joining an online or in-person support group for people who have chronic pain. Keep all follow-up visits. This is important. Where to find more information You can find more information about managing pain without opioids from: American Academy of Pain Medicine: painmed.org Institute for Chronic Pain: instituteforchronicpain.org American Chronic Pain Association: theacpa.org Contact a health care provider if: You have side effects from pain medicine. Your pain gets worse or does not get better with treatments or home therapy. You are struggling with anxiety or depression. Summary Many types of pain can be managed without opioids. Chronic pain may respond better to pain  management without opioids. Pain is best managed when you and a team of health care providers work together. Pain management without opioids may include non-opioid medicines, medical treatments, physical therapy, mental health therapy, and lifestyle changes. Tell your health care providers if your pain gets worse or is not being  managed well enough. This information is not intended to replace advice given to you by your health care provider. Make sure you discuss any questions you have with your health care provider. Document Revised: 01/27/2021 Document Reviewed: 01/27/2021 Elsevier Patient Education  2024 [employer].

## 2024-01-29 ENCOUNTER — Ambulatory Visit (INDEPENDENT_AMBULATORY_CARE_PROVIDER_SITE_OTHER): Admitting: Internal Medicine

## 2024-01-29 ENCOUNTER — Encounter: Payer: Self-pay | Admitting: Internal Medicine

## 2024-01-29 VITALS — BP 118/78 | HR 70 | Temp 98.2°F | Ht 72.0 in | Wt 173.0 lb

## 2024-01-29 DIAGNOSIS — R609 Edema, unspecified: Secondary | ICD-10-CM

## 2024-01-29 DIAGNOSIS — M25562 Pain in left knee: Secondary | ICD-10-CM | POA: Diagnosis not present

## 2024-01-29 DIAGNOSIS — I739 Peripheral vascular disease, unspecified: Secondary | ICD-10-CM | POA: Diagnosis not present

## 2024-01-29 DIAGNOSIS — M353 Polymyalgia rheumatica: Secondary | ICD-10-CM

## 2024-01-29 DIAGNOSIS — N320 Bladder-neck obstruction: Secondary | ICD-10-CM

## 2024-01-29 DIAGNOSIS — G8929 Other chronic pain: Secondary | ICD-10-CM | POA: Diagnosis not present

## 2024-01-29 DIAGNOSIS — K121 Other forms of stomatitis: Secondary | ICD-10-CM | POA: Diagnosis not present

## 2024-01-29 DIAGNOSIS — E785 Hyperlipidemia, unspecified: Secondary | ICD-10-CM | POA: Diagnosis not present

## 2024-01-29 MED ORDER — TRIAMCINOLONE ACETONIDE 0.5 % EX CREA: 1.0000 | TOPICAL_CREAM | Freq: Three times a day (TID) | CUTANEOUS | 1 refills | Status: DC

## 2024-01-29 MED ORDER — PREDNISONE 10 MG PO TABS
10.0000 mg | ORAL_TABLET | Freq: Every day | ORAL | 1 refills | Status: DC
Start: 2024-01-29 — End: 2024-06-12

## 2024-01-29 NOTE — Progress Notes (Signed)
 Subjective:  Patient ID: Scott Oneal, male    DOB: 1940/11/25  Age: 83 y.o. MRN: [id number]  CC: Annual Exam   HPI Scott Oneal presents for weakness, gait disorder, arthritis, falls Feeling better off Lipitor, on prednisone, off Flomax, on strict gluten free diet  C/o AK  Outpatient Medications Prior to Visit  Medication Sig Dispense Refill   acetaminophen (TYLENOL) 650 MG CR tablet Take 650 mg by mouth in the morning.     aspirin EC 81 MG tablet Take 1 tablet (81 mg total) by mouth daily. Swallow whole.     calcitRIOL (ROCALTROL) 0.25 MCG capsule TAKE 1 CAPSULE BY MOUTH EVERY DAY (Patient taking differently: Take 0.25 mcg by mouth in the morning.) 90 capsule 1   carvedilol (COREG) 6.25 MG tablet Take 1 tablet (6.25 mg total) by mouth in the morning and at bedtime. 180 tablet 3   Cholecalciferol (VITAMIN D3) 50 MCG (2000 UT) TABS Take 2,000 Units by mouth daily.     levothyroxine (SYNTHROID) 175 MCG tablet Take 1 tablet (175 mcg total) by mouth daily. 90 tablet 3   lipase/protease/amylase (CREON) 36000 UNITS CPEP capsule Take 1-2 capsules (36,000-72,000 Units total) by mouth 3 (three) times daily before meals. (Patient taking differently: Take 72,000 Units by mouth 3 (three) times daily before meals. Per patient taking 2 tablets three times a day) 300 capsule 5   Multiple Vitamins-Minerals (PRESERVISION AREDS 2) CAPS Take 1 capsule by mouth in the morning and at bedtime.     nitroGLYCERIN (NITROSTAT) 0.4 MG SL tablet Place 0.4 mg under the tongue every 5 (five) minutes as needed for chest pain.     pantoprazole (PROTONIX) 40 MG tablet TAKE 1 TABLET BY MOUTH TWICE A DAY 180 tablet 1   Propylene Glycol (SYSTANE BALANCE) 0.6 % SOLN Place 1 drop into both eyes 3 (three) times daily as needed (for dryness).     Syringe/Needle, Disp, (SYRINGE 3CC/23GX1") 23G X 1" 3 ML MISC Use for IM inj q 2 wks 50 each 3   testosterone cypionate (DEPOTESTOSTERONE CYPIONATE) 200 MG/ML injection INJECT  INTO MUSCLE EVERY 2 WEEKS (Patient taking differently: Inject 400 mg into the muscle every 14 (fourteen) days.) 3 mL 3   traMADol (ULTRAM) 50 MG tablet Take 1 tablet (50 mg total) by mouth every 6 (six) hours as needed for severe pain (pain score 7-10). 120 tablet 3   predniSONE (DELTASONE) 10 MG tablet Prednisone 10 mg: take 4 tabs a day x 3 days; then 3 tabs a day x 4 days; then 2 tabs a day x 4 days, then 1 tab a day. Take pc. 100 tablet 1   furosemide (LASIX) 40 MG tablet Take 20 mg by mouth at bedtime. (Patient not taking: Reported on 01/29/2024)     atorvastatin (LIPITOR) 40 MG tablet TAKE 1 TABLET BY MOUTH EVERY DAY (Patient not taking: Reported on 01/29/2024) 90 tablet 3   clindamycin (CLEOCIN) 300 MG capsule Take 1 capsule (300 mg total) by mouth 3 (three) times daily. (Patient not taking: Reported on 01/29/2024) 21 capsule 0   No facility-administered medications prior to visit.    ROS: Review of Systems  Constitutional:  Positive for fatigue. Negative for appetite change and unexpected weight change.  HENT:  Negative for congestion, nosebleeds, sneezing, sore throat and trouble swallowing.   Eyes:  Negative for itching and visual disturbance.  Respiratory:  Negative for cough.   Cardiovascular:  Positive for leg swelling. Negative for chest pain  and palpitations.  Gastrointestinal:  Negative for abdominal distention, blood in stool, diarrhea and nausea.  Genitourinary:  Negative for frequency and hematuria.  Musculoskeletal:  Positive for arthralgias, back pain and gait problem. Negative for joint swelling and neck pain.  Skin:  Negative for rash.  Neurological:  Positive for weakness. Negative for dizziness, tremors, speech difficulty and light-headedness.  Hematological:  Bruises/bleeds easily.  Psychiatric/Behavioral:  Positive for decreased concentration. Negative for agitation, dysphoric mood, sleep disturbance and suicidal ideas. The patient is not nervous/anxious.      Objective:  BP 118/78   Pulse 70   Temp 98.2 F (36.8 C)   Ht 6' (1.829 m)   Wt 173 lb (78.5 kg)   SpO2 99%   BMI 23.46 kg/m   BP Readings from Last 3 Encounters:  01/29/24 118/78  01/10/24 116/68  01/03/24 (!) 153/75    Wt Readings from Last 3 Encounters:  01/29/24 173 lb (78.5 kg)  01/25/24 166 lb (75.3 kg)  01/10/24 166 lb (75.3 kg)    Physical Exam Constitutional:      General: He is not in acute distress.    Appearance: Normal appearance. He is well-developed.     Comments: NAD  Eyes:     Conjunctiva/sclera: Conjunctivae normal.     Pupils: Pupils are equal, round, and reactive to light.  Neck:     Thyroid: No thyromegaly.     Vascular: No JVD.  Cardiovascular:     Rate and Rhythm: Normal rate and regular rhythm.     Heart sounds: Normal heart sounds. No murmur heard.    No friction rub. No gallop.  Pulmonary:     Effort: Pulmonary effort is normal. No respiratory distress.     Breath sounds: Normal breath sounds. No wheezing or rales.  Chest:     Chest wall: No tenderness.  Abdominal:     General: Bowel sounds are normal. There is no distension.     Palpations: Abdomen is soft. There is no mass.     Tenderness: There is no abdominal tenderness. There is no guarding or rebound.  Musculoskeletal:        General: Tenderness present. Normal range of motion.     Cervical back: Normal range of motion.     Right lower leg: Edema present.     Left lower leg: Edema present.  Lymphadenopathy:     Cervical: No cervical adenopathy.  Skin:    General: Skin is warm and dry.     Findings: No rash.  Neurological:     Mental Status: He is alert and oriented to person, place, and time.     Cranial Nerves: No cranial nerve deficit.     Motor: No abnormal muscle tone.     Coordination: Coordination normal.     Gait: Gait normal.     Deep Tendon Reflexes: Reflexes are normal and symmetric.  Psychiatric:        Behavior: Behavior normal.        Thought Content:  Thought content normal.        Judgment: Judgment normal.    Pt looks better Trace edema L>R LE Antalgic gait, using a cane Tongue w/irritation AKs   Procedure Note :     Procedure : Cryosurgery   Indication:  Wart(s)  Actinic keratosis(es)   Risks including unsuccessful procedure , bleeding, infection, bruising, scar, a need for a repeat  procedure and others were explained to the patient in detail as well as the benefits.  Informed consent was obtained verbally.     lesion(s)  on    was/were treated with liquid nitrogen on a Q-tip in a usual fasion . Band-Aid was applied and antibiotic ointment was given for a later use.   Tolerated well. Complications none.   Postprocedure instructions :     Keep the wounds clean. You can wash them with liquid soap and water. Pat dry with gauze or a Kleenex tissue  Before applying antibiotic ointment and a Band-Aid.   You need to report immediately  if  any signs of infection develop.   Lab Results  Component Value Date   WBC 8.1 01/10/2024   HGB 14.5 01/10/2024   HCT 45.7 01/10/2024   PLT 170.0 01/10/2024   GLUCOSE 93 01/10/2024   CHOL 143 06/25/2022   TRIG 88 06/25/2022   HDL 42 06/25/2022   LDLCALC 83 06/25/2022   ALT 19 01/10/2024   AST 27 01/10/2024   NA 142 01/10/2024   K 4.5 01/10/2024   CL 102 01/10/2024   CREATININE 2.08 (H) 01/10/2024   BUN 31 (H) 01/10/2024   CO2 30 01/10/2024   TSH 1.80 01/10/2024   PSA 2.58 07/05/2022   INR 1.3 (H) 06/23/2022   HGBA1C 6.4 04/06/2023   MICROALBUR 1.2 05/18/2017    VAS [place] LOWER EXTREMITY VENOUS (DVT) Result Date: 01/03/2024  Lower Venous DVT Study Patient Name:  Scott Oneal  Date of Exam:   01/03/2024 Medical Rec #: [id number]      Accession #:    [id number] Date of Birth: [birth year]     Patient Gender: M Patient Age:   17 years Exam Location:  [facility] Procedure:      VAS [place] LOWER EXTREMITY VENOUS (DVT) Referring Phys: Lemar Livings  --------------------------------------------------------------------------------  Indications: Leg pain and swelling. Triage appt.  Risk Factors: DVT Hx Chronic DVT Left CFV-PTV/Peroneal veins 06/24/22 Left mechanical thrombectomy Left common iliac vein, external iliac vein, common femoral vein, and left femoral vein. Performing Technologist: Dorthula Matas RVS, RCS  Examination Guidelines: A complete evaluation includes B-mode imaging, spectral Doppler, color Doppler, and power Doppler as needed of all accessible portions of each vessel. Bilateral testing is considered an integral part of a complete examination. Limited examinations for reoccurring indications may be performed as noted. The reflux portion of the exam is performed with the patient in reverse Trendelenburg.  +---------+---------------+---------+-----------+----------+--------------+ LEFT     CompressibilityPhasicitySpontaneityPropertiesThrombus Aging +---------+---------------+---------+-----------+----------+--------------+ CFV      Full                                                        +---------+---------------+---------+-----------+----------+--------------+ SFJ      Full                                                        +---------+---------------+---------+-----------+----------+--------------+ FV Prox  Partial                                      Chronic        +---------+---------------+---------+-----------+----------+--------------+ FV Mid   Partial  Chronic        +---------+---------------+---------+-----------+----------+--------------+ FV DistalPartial                                      Chronic        +---------+---------------+---------+-----------+----------+--------------+ POP      Partial                                      Chronic        +---------+---------------+---------+-----------+----------+--------------+ PTV      Partial                                       Chronic        +---------+---------------+---------+-----------+----------+--------------+ PERO     Partial                                      Chronic        +---------+---------------+---------+-----------+----------+--------------+ GSV      Full                                                        +---------+---------------+---------+-----------+----------+--------------+     Summary: RIGHT: - No evidence of common femoral vein obstruction.   LEFT: - Findings consistent with chronic deep vein thrombosis involving the left femoral vein, left popliteal vein, left posterior tibial veins, and left peroneal veins.  Essentially unchanged since last exam 11/08/2023  *See table(s) above for measurements and observations. Electronically signed by Lemar Livings MD on 01/03/2024 at 5:00:13 PM.    Final     Assessment & Plan:   Problem List Items Addressed This Visit     PMR (polymyalgia rheumatica) (HCC) - Primary   Feeling better off Lipitor, on prednisone, off Flomax, on strict gluten free diet      Claudication (HCC)   On Lipitor, Eliquis      Dyslipidemia   Feeling better off Lipitor - d/c      Bladder neck obstruction   Feeling better off Flomax, on strict gluten free diet      Knee pain, chronic   B braces F/u w/Ortho      Relevant Medications   predniSONE (DELTASONE) 10 MG tablet      Meds ordered this encounter  Medications   predniSONE (DELTASONE) 10 MG tablet    Sig: Take 1 tablet (10 mg total) by mouth daily with breakfast. Take pc.    Dispense:  100 tablet    Refill:  1      Follow-up: Return in about 6 weeks (around 03/11/2024) for a follow-up visit.  Sonda Primes, MD

## 2024-01-29 NOTE — Assessment & Plan Note (Signed)
 Calf Compression Sleeve

## 2024-01-29 NOTE — Assessment & Plan Note (Signed)
 Feeling better off Lipitor - d/c

## 2024-01-29 NOTE — Assessment & Plan Note (Signed)
 Adult Extra Soft Toothbrush with [address] Soft Bristles, (Pack of 6) Micro Nano Manual Toothbrushes for Protect Sensitive Gums, Black/White  Use [employer] Peroxicare tooth paste

## 2024-01-29 NOTE — Assessment & Plan Note (Signed)
 Feeling better off Flomax, on strict gluten free diet

## 2024-01-29 NOTE — Assessment & Plan Note (Signed)
 Feeling better off Lipitor, on prednisone, off Flomax, on strict gluten free diet

## 2024-01-29 NOTE — Assessment & Plan Note (Signed)
On Lipitor, Eliquis 

## 2024-01-29 NOTE — Assessment & Plan Note (Signed)
 B braces F/u w/Ortho

## 2024-01-29 NOTE — Patient Instructions (Addendum)
 CAMBIVO 3 Pairs Calf Compression Sleeve for Women Men, Leg Support for Shin Splints - [email or web address]  Adult Extra Soft Toothbrush with 16[address] Soft Bristles, (Pack of 6) Micro Nano Manual Toothbrushes for Protect Sensitive Gums, Black/White  Use [employer] Peroxicare tooth paste

## 2024-01-31 DIAGNOSIS — N320 Bladder-neck obstruction: Secondary | ICD-10-CM | POA: Diagnosis not present

## 2024-01-31 DIAGNOSIS — I129 Hypertensive chronic kidney disease with stage 1 through stage 4 chronic kidney disease, or unspecified chronic kidney disease: Secondary | ICD-10-CM | POA: Diagnosis not present

## 2024-01-31 DIAGNOSIS — N183 Chronic kidney disease, stage 3 unspecified: Secondary | ICD-10-CM | POA: Diagnosis not present

## 2024-01-31 DIAGNOSIS — Z6841 Body Mass Index (BMI) 40.0 and over, adult: Secondary | ICD-10-CM | POA: Diagnosis not present

## 2024-01-31 DIAGNOSIS — R319 Hematuria, unspecified: Secondary | ICD-10-CM | POA: Diagnosis not present

## 2024-02-02 ENCOUNTER — Other Ambulatory Visit: Payer: Self-pay | Admitting: Internal Medicine

## 2024-02-02 NOTE — Telephone Encounter (Signed)
 Copied from CRM [id number]. Topic: General - Other >> Feb 02, 2024 10:15 AM Arley Phenix D wrote: Reason for CRM: Misty Stanley from [facility] is calling regarding the patient's FL2's.Misty Stanley stated that they need to be signed and faxed back before the patient can move I to the facility.Contacted the office and they stated that the patient hasn't selected a facility as of yet and they haven't received the FL2's.   Misty Stanley stated that the patient has selected to stay at the [facility] facility and she will be faxing over the FL2's again today and stated that they need to be signed and faxed back today so the patient can move in.

## 2024-02-02 NOTE — Telephone Encounter (Signed)
 Copied from CRM [id number]. Topic: Clinical - Medication Refill >> Feb 02, 2024 11:49 AM [name] wrote: Most Recent Primary Care Visit:  Provider: Tresa Garter  Department: [facility]  Visit Type: PHYSICAL  Date: 01/29/2024  Medication: carvedilol (COREG) 6.25 MG tablet  Has the patient contacted their pharmacy? Yes (Agent: If no, request that the patient contact the pharmacy for the refill. If patient does not wish to contact the pharmacy document the reason why and proceed with request.) (Agent: If yes, when and what did the pharmacy advise?)  Is this the correct pharmacy for this prescription? Yes If no, delete pharmacy and type the correct one.  This is the patient's preferred pharmacy:  [id number] [address], [place] - [address] [place] [address] Phone: [phone] Fax: [phone]   Has the prescription been filled recently? No  Is the patient out of the medication? No  Has the patient been seen for an appointment in the last year OR does the patient have an upcoming appointment? Yes  Can we respond through MyChart? Yes  Agent: Please be advised that Rx refills may take up to 3 business days. We ask that you follow-up with your pharmacy.

## 2024-02-02 NOTE — Telephone Encounter (Signed)
 Noting that we have received the fl2 forms for both patient and spouse. Unfortunately these will not be able to be addressed today as Dr.Plotnikov is out of office. Will be returning Monday and making sure he is aware that these are urgent.

## 2024-02-02 NOTE — Telephone Encounter (Signed)
 Copied from CRM [id number]. Topic: Clinical - Medication Refill >> Feb 02, 2024 11:44 AM Marica Otter wrote: Most Recent Primary Care Visit:  Provider: Tresa Garter  Department: [facility]  Visit Type: PHYSICAL  Date: 01/29/2024  Medication: calcitRIOL (ROCALTROL) 0.25 MCG capsule  Has the patient contacted their pharmacy? Yes, prescriber needs to approve 90 day supply (Agent: If no, request that the patient contact the pharmacy for the refill. If patient does not wish to contact the pharmacy document the reason why and proceed with request.) (Agent: If yes, when and what did the pharmacy advise?)  Is this the correct pharmacy for this prescription? Yes If no, delete pharmacy and type the correct one.  This is the patient's preferred pharmacy:  [id number] [address], [place] - [address] [place] [address] Phone: [phone] Fax: [phone]   Has the prescription been filled recently? No  Is the patient out of the medication? No  Has the patient been seen for an appointment in the last year OR does the patient have an upcoming appointment? Yes  Can we respond through MyChart? Yes  Agent: Please be advised that Rx refills may take up to 3 business days. We ask that you follow-up with your pharmacy.

## 2024-02-05 MED ORDER — CALCITRIOL 0.25 MCG PO CAPS: 0.2500 ug | ORAL_CAPSULE | Freq: Every morning | ORAL | 1 refills | Status: DC

## 2024-02-06 NOTE — Telephone Encounter (Signed)
 Copied from CRM [id number]. Topic: General - Other >> Feb 06, 2024  1:02 PM Eunice Blase wrote: Reason for CRM: Received call from [facility] per [place] ph: [phone] fax: [phone]. She needs the pt's FL2 form completed and signed.

## 2024-02-06 NOTE — Telephone Encounter (Signed)
 Patient daughter in law  is calling in regarding the     fl2 form she would like a call back.     Also is needing a form to revoked  patient drivers license

## 2024-02-07 ENCOUNTER — Other Ambulatory Visit: Payer: Self-pay

## 2024-02-07 ENCOUNTER — Telehealth: Payer: Self-pay | Admitting: Internal Medicine

## 2024-02-07 DIAGNOSIS — Z111 Encounter for screening for respiratory tuberculosis: Secondary | ICD-10-CM

## 2024-02-07 DIAGNOSIS — H353211 Exudative age-related macular degeneration, right eye, with active choroidal neovascularization: Secondary | ICD-10-CM | POA: Diagnosis not present

## 2024-02-07 NOTE — Telephone Encounter (Signed)
 Copied from CRM [id number]. Topic: General - Other >> Feb 06, 2024  3:38 PM Adaysia C wrote: Reason for CRM: Patients daughter in law(Jane) called to request that the provider assist with getting the patients license revoked; the patient has been threatening to drive when he is upset; the patient drove to the bank recently; patients son(Chad Centrella) believes his father should not being driving anymore; please follow up with [place] regarding this request [phone]

## 2024-02-08 ENCOUNTER — Ambulatory Visit: Admitting: Podiatry

## 2024-02-08 NOTE — Telephone Encounter (Signed)
 Copied from CRM [id number]. Topic: Clinical - Medication Question >> Feb 08, 2024  2:58 PM Sonny Dandy B wrote: Reason for CRM: lisa wigert from [facility] [place] [facility] called to speak with nurse, provider regarding patient and his wife. States the fl2 is incorrect, it needs to be updated and changed, Warden Fillers , Need the Medicine to be hand written on the FL2, can not  use please see attached . Please call lisa back at  [phone] also, The nurse needs to know if they are to refer to [facility] , When to contact the provider

## 2024-02-08 NOTE — Telephone Encounter (Signed)
 Spoke with Misty Stanley and have since resent forms for patient and spouse. Waiting on fax confirmation of completion but did confirm with Misty Stanley that the pages were going through.

## 2024-02-08 NOTE — Telephone Encounter (Signed)
 FL2 form has since been sent with confirmation.

## 2024-02-08 NOTE — Telephone Encounter (Signed)
 Copied from CRM [id number]. Topic: General - Other >> Feb 08, 2024 12:46 PM Caliyah H wrote: Reason for CRM: Received a call from [place] at [facility] regarding the patient's SLQ forms. She mentioned that she has been trying to obtain these forms for the past 4 days. Her callback number is [phone], and the fax number is [phone]. She also stated that she received a voicemail at 8:00 this morning, stating that the forms would be signed and faxed, but no name was left on the voicemail.

## 2024-02-12 ENCOUNTER — Other Ambulatory Visit

## 2024-02-12 ENCOUNTER — Ambulatory Visit: Admitting: Podiatry

## 2024-02-12 DIAGNOSIS — Z111 Encounter for screening for respiratory tuberculosis: Secondary | ICD-10-CM | POA: Diagnosis not present

## 2024-02-12 DIAGNOSIS — R234 Changes in skin texture: Secondary | ICD-10-CM

## 2024-02-12 DIAGNOSIS — L97422 Non-pressure chronic ulcer of left heel and midfoot with fat layer exposed: Secondary | ICD-10-CM | POA: Diagnosis not present

## 2024-02-12 NOTE — Progress Notes (Signed)
 Subjective: Chief Complaint  Patient presents with   HEEL ULCER    RM#13 Follow up on bilateral heel ulcer patient states doing well.    83 year old male presents after the above concerns.  He states that the wounds are doing much better.  He has not been experiencing the pain that he was having previously has not seen any swelling or redness or any drainage or pus.  He does not endorse any fevers or chills.  He is asking for an Achilles sleeve to help decrease pressure.  Objective: AAO x3, NAD DP/PT pulses palpable bilaterally, CRT less than 3 seconds To the posterior aspect the right heel appears that the wound has healed.  This is preulcerative however.  There is no drainage or pus identified there is no surrounding erythema, ascending cellulitis.  No fluctuation or crepitation.   On the left posterior heel there is still a skin fissure present appears to be more superficial with minimal granulation tissue present.  There is no probing, undermining or tunneling.  There is no surrounding erythema, ascending cellulitis.  There is no fluctuation, crepitation, malodor.  No obvious signs of infection noted at this site either.   No other open lesions identified at this time.   No pain with calf compression, swelling, warmth, erythema  Assessment: Bilateral heel ulcerations left > right  Plan: Bilateral heel ulcerations -Clean the wounds today.  Antibiotic ointment was applied followed by dressing.  Recommend continue with daily dressing changes, offloading.  Recommended some amount of Vaseline at nighttime.  There is no signs of infection we will continue to monitor closely.  Dispensed gel Achilles sleeves to help decrease pressure.  He has followed up with vascular surgery as well regards to his circulation.  Return in about 6 weeks (around 03/25/2024) for heel ulcer, nail trim .  Charity Conch DPM

## 2024-02-12 NOTE — Telephone Encounter (Signed)
 Has since been re-written and sent back 02/09/24

## 2024-02-13 NOTE — Telephone Encounter (Signed)
 Attempted to call patient's son back at number provided and had left a voice message for call back

## 2024-02-14 ENCOUNTER — Telehealth: Payer: Self-pay | Admitting: Internal Medicine

## 2024-02-14 LAB — QUANTIFERON-TB GOLD PLUS
QuantiFERON Mitogen Value: >10 [IU]/mL
QuantiFERON Nil Value: 0.07 [IU]/mL
QuantiFERON TB1 Ag Value: 0.06 [IU]/mL
QuantiFERON TB2 Ag Value: 0.08 [IU]/mL
QuantiFERON-TB Gold Plus: NEGATIVE

## 2024-02-14 NOTE — Telephone Encounter (Signed)
 Copied from CRM [id number]. Topic: Clinical - Lab/Test Results >> Feb 14, 2024  1:00 PM Danika B wrote: Reason for CRM: Patient and wife called in regarding TB test lab results as they are moving and need the results for their new senior living community. Patients chart showed results were in and released but no notes to read to patient from provider. Attempted to transfer to CAL and clinical team as wife's results had results for staff only but nurse unavailable, and will call patients back.

## 2024-02-15 NOTE — Telephone Encounter (Signed)
 Good morning!  Please advise on note below. Looks like results came back but have not been noted on yet

## 2024-02-20 ENCOUNTER — Telehealth: Payer: Self-pay | Admitting: Internal Medicine

## 2024-02-20 DIAGNOSIS — I739 Peripheral vascular disease, unspecified: Secondary | ICD-10-CM | POA: Insufficient documentation

## 2024-02-20 DIAGNOSIS — I259 Chronic ischemic heart disease, unspecified: Secondary | ICD-10-CM | POA: Insufficient documentation

## 2024-02-20 NOTE — Telephone Encounter (Signed)
 Copied from CRM [id number]. Topic: General - Other >> Feb 20, 2024 11:22 AM Freya Jesus wrote: Reason for CRM: [facility] Wellness Director at [facility] - Patient is moving in today and stated he had allergies but when provider filled out FL2 forms no allergies were listed. Needing a  copy of allergies. Also FL2 form missing last height and weight. Phone: [phone] - Fax: [phone]

## 2024-02-21 DIAGNOSIS — M170 Bilateral primary osteoarthritis of knee: Secondary | ICD-10-CM | POA: Diagnosis not present

## 2024-02-22 ENCOUNTER — Ambulatory Visit: Payer: PPO | Admitting: Nurse Practitioner

## 2024-02-22 ENCOUNTER — Telehealth: Payer: Self-pay | Admitting: Dietician

## 2024-02-22 NOTE — Telephone Encounter (Signed)
 Error

## 2024-02-22 NOTE — Telephone Encounter (Signed)
Spoke with patient, this has been taken care of.

## 2024-02-22 NOTE — Telephone Encounter (Signed)
 I have added on the first [facility] page and have fax this back over

## 2024-02-27 ENCOUNTER — Telehealth: Payer: Self-pay | Admitting: Internal Medicine

## 2024-02-27 NOTE — Telephone Encounter (Signed)
 Copied from CRM [id number]. Topic: General - Other >> Feb 27, 2024 12:24 PM Adonis Hoot wrote: Reason for CRM: Adah Acron from [facility] called in stating that patient would like to start his Gabapentin .However  they are needing order for him to be able to take this medication. Patient also stated that he is suppose to be prescribed eye lubricant as well. They have the gabapentin  but not the eye lubricant at the facility. Cb#:[phone] Fax#: [phone]

## 2024-02-28 NOTE — Telephone Encounter (Signed)
 Use over-the-counter "Clear Eyes" eyedrops 1 drop in each eye 3 times a day Thanks

## 2024-02-29 NOTE — Telephone Encounter (Signed)
 Spoke with the pt and was able to inform him of the following per PCP "Use over-the-counter "Clear Eyes" eyedrops 1 drop in each eye 3 times a day Thanks."  Pt has stated understanding. Has no questions or concerns at this time.

## 2024-03-01 NOTE — Telephone Encounter (Signed)
 Good afternoon!  Wanted to check if patient is okay to start the gabapentin  back as well?

## 2024-03-04 NOTE — Telephone Encounter (Signed)
 Okay to restart gabapentin , only if he feels he needs it.  Thank you

## 2024-03-06 NOTE — Telephone Encounter (Signed)
 Called and spoke with [facility] representative and informed them that this medication had been discontinued in the past. Set up an appointment for them to talk with Dr.Plotnikov regarding other options for neuropathy treatment

## 2024-03-06 NOTE — Telephone Encounter (Signed)
 We stopped gabapentin  (that is why I was asking). Discontinue gabapentin .  Thanks

## 2024-03-13 ENCOUNTER — Ambulatory Visit: Admitting: Internal Medicine

## 2024-03-14 ENCOUNTER — Encounter: Payer: Self-pay | Admitting: Internal Medicine

## 2024-03-14 ENCOUNTER — Ambulatory Visit: Admitting: Internal Medicine

## 2024-03-14 VITALS — BP 110/62 | HR 77 | Temp 98.6°F | Ht 72.0 in | Wt 168.0 lb

## 2024-03-14 DIAGNOSIS — M109 Gout, unspecified: Secondary | ICD-10-CM

## 2024-03-14 DIAGNOSIS — R609 Edema, unspecified: Secondary | ICD-10-CM | POA: Diagnosis not present

## 2024-03-14 DIAGNOSIS — L97902 Non-pressure chronic ulcer of unspecified part of unspecified lower leg with fat layer exposed: Secondary | ICD-10-CM | POA: Insufficient documentation

## 2024-03-14 DIAGNOSIS — E034 Atrophy of thyroid (acquired): Secondary | ICD-10-CM

## 2024-03-14 MED ORDER — TORSEMIDE 20 MG PO TABS: 20.0000 mg | ORAL_TABLET | Freq: Every day | ORAL | 1 refills | Status: DC

## 2024-03-14 MED ORDER — MUPIROCIN 2 % EX OINT: TOPICAL_OINTMENT | CUTANEOUS | 0 refills | Status: AC

## 2024-03-14 MED ORDER — MUPIROCIN 2 % EX OINT: TOPICAL_OINTMENT | CUTANEOUS | 0 refills | Status: DC

## 2024-03-14 NOTE — Assessment & Plan Note (Signed)
Chronic Cont on Levothroid

## 2024-03-14 NOTE — Assessment & Plan Note (Signed)
 Use Calf Compression Sleeve Start Demadex 20 mg/d

## 2024-03-14 NOTE — Progress Notes (Signed)
 Subjective:  Patient ID: Scott Oneal, male    DOB: 02/17/1941  Age: 83 y.o. MRN: [id number]  CC: Medical Management of Chronic Issues (Discuss leg wound and other health concerns with neuropathy)   HPI AAVION JELKS presents for leg wounds, HTN, celiac disease, PMR, edema  Outpatient Medications Prior to Visit  Medication Sig Dispense Refill   acetaminophen  (TYLENOL ) 650 MG CR tablet Take 650 mg by mouth in the morning.     aspirin  EC 81 MG tablet Take 1 tablet (81 mg total) by mouth daily. Swallow whole.     calcitRIOL  (ROCALTROL ) 0.25 MCG capsule Take 1 capsule (0.25 mcg total) by mouth in the morning. 90 capsule 1   carvedilol  (COREG ) 6.25 MG tablet Take 1 tablet (6.25 mg total) by mouth in the morning and at bedtime. 180 tablet 3   Cholecalciferol  (VITAMIN D3) 50 MCG (2000 UT) TABS Take 2,000 Units by mouth daily.     furosemide  (LASIX ) 40 MG tablet Take 20 mg by mouth at bedtime.     levothyroxine  (SYNTHROID ) 175 MCG tablet Take 1 tablet (175 mcg total) by mouth daily. 90 tablet 3   lipase/protease/amylase (CREON ) 36000 UNITS CPEP capsule Take 1-2 capsules (36,000-72,000 Units total) by mouth 3 (three) times daily before meals. (Patient taking differently: Take 72,000 Units by mouth 3 (three) times daily before meals. Per patient taking 2 tablets three times a day) 300 capsule 5   Multiple Vitamins-Minerals (PRESERVISION AREDS 2) CAPS Take 1 capsule by mouth in the morning and at bedtime.     nitroGLYCERIN  (NITROSTAT ) 0.4 MG SL tablet Place 0.4 mg under the tongue every 5 (five) minutes as needed for chest pain.     pantoprazole  (PROTONIX ) 40 MG tablet TAKE 1 TABLET BY MOUTH TWICE A DAY 180 tablet 1   predniSONE  (DELTASONE ) 10 MG tablet Take 1 tablet (10 mg total) by mouth daily with breakfast. Take pc. 100 tablet 1   Propylene Glycol (SYSTANE BALANCE) 0.6 % SOLN Place 1 drop into both eyes 3 (three) times daily as needed (for dryness).     Syringe/Needle, Disp, (SYRINGE 3CC/23GX1")  23G X 1" 3 ML MISC Use for IM inj q 2 wks 50 each 3   testosterone  cypionate (DEPOTESTOSTERONE CYPIONATE) 200 MG/ML injection INJECT INTO MUSCLE EVERY 2 WEEKS (Patient taking differently: Inject 400 mg into the muscle every 14 (fourteen) days.) 3 mL 3   traMADol  (ULTRAM ) 50 MG tablet Take 1 tablet (50 mg total) by mouth every 6 (six) hours as needed for severe pain (pain score 7-10). 120 tablet 3   triamcinolone  cream (KENALOG ) 0.5 % Apply 1 Application topically 3 (three) times daily. 45 g 1   No facility-administered medications prior to visit.    ROS: Review of Systems  Constitutional:  Negative for appetite change, fatigue and unexpected weight change.  HENT:  Negative for congestion, nosebleeds, sneezing, sore throat and trouble swallowing.   Eyes:  Negative for itching and visual disturbance.  Respiratory:  Negative for cough.   Cardiovascular:  Positive for leg swelling. Negative for chest pain and palpitations.  Gastrointestinal:  Negative for abdominal distention, blood in stool, diarrhea and nausea.  Genitourinary:  Positive for frequency. Negative for flank pain and hematuria.  Musculoskeletal:  Positive for arthralgias, back pain and gait problem. Negative for joint swelling and neck pain.  Skin:  Positive for color change and wound. Negative for rash.  Neurological:  Negative for dizziness, tremors, speech difficulty and weakness.  Psychiatric/Behavioral:  Positive for decreased concentration. Negative for agitation, confusion, dysphoric mood, sleep disturbance and suicidal ideas. The patient is not nervous/anxious.     Objective:  BP 110/62   Pulse 77   Temp 98.6 F (37 C) (Oral)   Ht 6' (1.829 m)   Wt 168 lb (76.2 kg)   SpO2 98%   BMI 22.78 kg/m   BP Readings from Last 3 Encounters:  03/14/24 110/62  01/29/24 118/78  01/10/24 116/68    Wt Readings from Last 3 Encounters:  03/14/24 168 lb (76.2 kg)  01/29/24 173 lb (78.5 kg)  01/25/24 166 lb (75.3 kg)     Physical Exam Constitutional:      General: He is not in acute distress.    Appearance: He is well-developed.     Comments: NAD  Eyes:     Conjunctiva/sclera: Conjunctivae normal.     Pupils: Pupils are equal, round, and reactive to light.  Neck:     Thyroid : No thyromegaly.     Vascular: No JVD.  Cardiovascular:     Rate and Rhythm: Normal rate and regular rhythm.     Heart sounds: Normal heart sounds. No murmur heard.    No friction rub. No gallop.  Pulmonary:     Effort: Pulmonary effort is normal. No respiratory distress.     Breath sounds: Normal breath sounds. No wheezing or rales.  Chest:     Chest wall: No tenderness.  Abdominal:     General: Bowel sounds are normal. There is no distension.     Palpations: Abdomen is soft. There is no mass.     Tenderness: There is no abdominal tenderness. There is no guarding or rebound.  Musculoskeletal:        General: Tenderness present. Normal range of motion.     Cervical back: Normal range of motion.     Right lower leg: Edema present.     Left lower leg: Edema present.  Lymphadenopathy:     Cervical: No cervical adenopathy.  Skin:    General: Skin is warm and dry.     Findings: No rash.  Neurological:     Mental Status: He is alert and oriented to person, place, and time.     Cranial Nerves: No cranial nerve deficit.     Motor: No abnormal muscle tone.     Coordination: Coordination normal.     Gait: Gait normal.     Deep Tendon Reflexes: Reflexes are normal and symmetric.  Psychiatric:        Behavior: Behavior normal.        Thought Content: Thought content normal.        Judgment: Judgment normal.   Legs w 1= edema and shin sores/ulcers Leaking fluid  Lab Results  Component Value Date   WBC 8.1 01/10/2024   HGB 14.5 01/10/2024   HCT 45.7 01/10/2024   PLT 170.0 01/10/2024   GLUCOSE 93 01/10/2024   CHOL 143 06/25/2022   TRIG 88 06/25/2022   HDL 42 06/25/2022   LDLCALC 83 06/25/2022   ALT 19 01/10/2024    AST 27 01/10/2024   NA 142 01/10/2024   K 4.5 01/10/2024   CL 102 01/10/2024   CREATININE 2.08 (H) 01/10/2024   BUN 31 (H) 01/10/2024   CO2 30 01/10/2024   TSH 1.80 01/10/2024   PSA 2.58 07/05/2022   INR 1.3 (H) 06/23/2022   HGBA1C 6.4 04/06/2023   MICROALBUR 1.2 05/18/2017    VAS US  LOWER EXTREMITY VENOUS (DVT)  Result Date: 01/03/2024  Lower Venous DVT Study Patient Name:  ZAHMIR STELLHORN  Date of Exam:   01/03/2024 Medical Rec #: [id number]      Accession #:    [id number] Date of Birth: 1941/06/30     Patient Gender: M Patient Age:   75 years Exam Location:  [facility] Procedure:      VAS US  LOWER EXTREMITY VENOUS (DVT) Referring Phys: Angela Kell --------------------------------------------------------------------------------  Indications: Leg pain and swelling. Triage appt.  Risk Factors: DVT Hx Chronic DVT Left CFV-PTV/Peroneal veins 06/24/22 Left mechanical thrombectomy Left common iliac vein, external iliac vein, common femoral vein, and left femoral vein. Performing Technologist: Parke Boll RVS, RCS  Examination Guidelines: A complete evaluation includes B-mode imaging, spectral Doppler, color Doppler, and power Doppler as needed of all accessible portions of each vessel. Bilateral testing is considered an integral part of a complete examination. Limited examinations for reoccurring indications may be performed as noted. The reflux portion of the exam is performed with the patient in reverse Trendelenburg.  +---------+---------------+---------+-----------+----------+--------------+ LEFT     CompressibilityPhasicitySpontaneityPropertiesThrombus Aging +---------+---------------+---------+-----------+----------+--------------+ CFV      Full                                                        +---------+---------------+---------+-----------+----------+--------------+ SFJ      Full                                                         +---------+---------------+---------+-----------+----------+--------------+ FV Prox  Partial                                      Chronic        +---------+---------------+---------+-----------+----------+--------------+ FV Mid   Partial                                      Chronic        +---------+---------------+---------+-----------+----------+--------------+ FV DistalPartial                                      Chronic        +---------+---------------+---------+-----------+----------+--------------+ POP      Partial                                      Chronic        +---------+---------------+---------+-----------+----------+--------------+ PTV      Partial                                      Chronic        +---------+---------------+---------+-----------+----------+--------------+ PERO     Partial  Chronic        +---------+---------------+---------+-----------+----------+--------------+ GSV      Full                                                        +---------+---------------+---------+-----------+----------+--------------+     Summary: RIGHT: - No evidence of common femoral vein obstruction.   LEFT: - Findings consistent with chronic deep vein thrombosis involving the left femoral vein, left popliteal vein, left posterior tibial veins, and left peroneal veins.  Essentially unchanged since last exam 11/08/2023  *See table(s) above for measurements and observations. Electronically signed by Angela Kell MD on 01/03/2024 at 5:00:13 PM.    Final     Assessment & Plan:   Problem List Items Addressed This Visit     Hypothyroidism   Chronic Cont on Levothroid      Gout   Remote No relapse      Edema   Use Calf Compression Sleeve Start Demadex 20 mg/d      Relevant Orders   Ambulatory referral to Home Health   Ulcer of lower extremity with fat layer exposed (HCC) - Primary   Home Care for wound  Care Demadex po for edema Bactroban  oint/bandaid Compr sleeve      Relevant Orders   Ambulatory referral to Home Health      Meds ordered this encounter  Medications   DISCONTD: torsemide (DEMADEX) 20 MG tablet    Sig: Take 1 tablet (20 mg total) by mouth daily.    Dispense:  90 tablet    Refill:  1   DISCONTD: mupirocin  ointment (BACTROBAN ) 2 %    Sig: On leg wound w/dressing change qd or bid    Dispense:  30 g    Refill:  0   mupirocin  ointment (BACTROBAN ) 2 %    Sig: On leg wound w/dressing change qd or bid    Dispense:  30 g    Refill:  0   torsemide (DEMADEX) 20 MG tablet    Sig: Take 1 tablet (20 mg total) by mouth daily.    Dispense:  90 tablet    Refill:  1      Follow-up: No follow-ups on file.  Anitra Barn, MD

## 2024-03-14 NOTE — Assessment & Plan Note (Signed)
Remote No relapse 

## 2024-03-14 NOTE — Assessment & Plan Note (Signed)
 Home Care for wound Care Demadex po for edema Bactroban  oint/bandaid Compr sleeve

## 2024-03-14 NOTE — Patient Instructions (Signed)
 [employer]: [employer] Calf Compression Sleeve Men [employer] Women- Runner, broadcasting/film/video Support for Legs Pain Relief, Varicose Vein Treatment

## 2024-03-14 NOTE — Progress Notes (Signed)
 Subjective:  Patient ID: Scott Oneal, male    DOB: [birth year]  Age: 83 y.o. MRN: [id number]  CC: Medical Management of Chronic Issues (Discuss leg wound and other health concerns with neuropathy)   HPI Scott Oneal presents for leg wounds, edema, other issues. They moved to [facility]  Outpatient Medications Prior to Visit  Medication Sig Dispense Refill   acetaminophen  (TYLENOL ) 650 MG CR tablet Take 650 mg by mouth in the morning.     aspirin  EC 81 MG tablet Take 1 tablet (81 mg total) by mouth daily. Swallow whole.     calcitRIOL  (ROCALTROL ) 0.25 MCG capsule Take 1 capsule (0.25 mcg total) by mouth in the morning. 90 capsule 1   carvedilol  (COREG ) 6.25 MG tablet Take 1 tablet (6.25 mg total) by mouth in the morning and at bedtime. 180 tablet 3   Cholecalciferol  (VITAMIN D3) 50 MCG (2000 UT) TABS Take 2,000 Units by mouth daily.     furosemide  (LASIX ) 40 MG tablet Take 20 mg by mouth at bedtime.     levothyroxine  (SYNTHROID ) 175 MCG tablet Take 1 tablet (175 mcg total) by mouth daily. 90 tablet 3   lipase/protease/amylase (CREON ) 36000 UNITS CPEP capsule Take 1-2 capsules (36,000-72,000 Units total) by mouth 3 (three) times daily before meals. (Patient taking differently: Take 72,000 Units by mouth 3 (three) times daily before meals. Per patient taking 2 tablets three times a day) 300 capsule 5   Multiple Vitamins-Minerals (PRESERVISION AREDS 2) CAPS Take 1 capsule by mouth in the morning and at bedtime.     nitroGLYCERIN  (NITROSTAT ) 0.4 MG SL tablet Place 0.4 mg under the tongue every 5 (five) minutes as needed for chest pain.     pantoprazole  (PROTONIX ) 40 MG tablet TAKE 1 TABLET BY MOUTH TWICE A DAY 180 tablet 1   predniSONE  (DELTASONE ) 10 MG tablet Take 1 tablet (10 mg total) by mouth daily with breakfast. Take pc. 100 tablet 1   Propylene Glycol (SYSTANE BALANCE) 0.6 % SOLN Place 1 drop into both eyes 3 (three) times daily as needed (for dryness).     Syringe/Needle, Disp,  (SYRINGE 3CC/23GX1") 23G X 1" 3 ML MISC Use for IM inj q 2 wks 50 each 3   testosterone  cypionate (DEPOTESTOSTERONE CYPIONATE) 200 MG/ML injection INJECT INTO MUSCLE EVERY 2 WEEKS (Patient taking differently: Inject 400 mg into the muscle every 14 (fourteen) days.) 3 mL 3   traMADol  (ULTRAM ) 50 MG tablet Take 1 tablet (50 mg total) by mouth every 6 (six) hours as needed for severe pain (pain score 7-10). 120 tablet 3   triamcinolone  cream (KENALOG ) 0.5 % Apply 1 Application topically 3 (three) times daily. 45 g 1   No facility-administered medications prior to visit.    ROS: Review of Systems  Constitutional:  Positive for fatigue and unexpected weight change. Negative for appetite change.  HENT:  Negative for congestion, nosebleeds, sneezing, sore throat and trouble swallowing.   Eyes:  Negative for itching and visual disturbance.  Respiratory:  Positive for shortness of breath. Negative for cough and chest tightness.   Cardiovascular:  Positive for leg swelling. Negative for chest pain and palpitations.  Gastrointestinal:  Negative for abdominal distention, blood in stool, diarrhea and nausea.  Genitourinary:  Negative for frequency and hematuria.  Musculoskeletal:  Positive for arthralgias, back pain and gait problem. Negative for joint swelling and neck pain.  Skin:  Positive for color change and wound. Negative for rash.  Neurological:  Positive for weakness.  Negative for dizziness, tremors, speech difficulty and light-headedness.  Hematological:  Bruises/bleeds easily.  Psychiatric/Behavioral:  Positive for decreased concentration. Negative for agitation, confusion, dysphoric mood, hallucinations, sleep disturbance and suicidal ideas. The patient is not nervous/anxious.     Objective:  BP 110/62   Pulse 77   Temp 98.6 F (37 C) (Oral)   Ht 6' (1.829 m)   Wt 168 lb (76.2 kg)   SpO2 98%   BMI 22.78 kg/m   BP Readings from Last 3 Encounters:  03/14/24 110/62  01/29/24  118/78  01/10/24 116/68    Wt Readings from Last 3 Encounters:  03/14/24 168 lb (76.2 kg)  01/29/24 173 lb (78.5 kg)  01/25/24 166 lb (75.3 kg)    Physical Exam Constitutional:      General: He is not in acute distress.    Appearance: He is well-developed.     Comments: NAD  Eyes:     Conjunctiva/sclera: Conjunctivae normal.     Pupils: Pupils are equal, round, and reactive to light.  Neck:     Thyroid : No thyromegaly.     Vascular: No JVD.  Cardiovascular:     Rate and Rhythm: Normal rate and regular rhythm.     Heart sounds: Normal heart sounds. No murmur heard.    No friction rub. No gallop.  Pulmonary:     Effort: Pulmonary effort is normal. No respiratory distress.     Breath sounds: Normal breath sounds. No wheezing or rales.  Chest:     Chest wall: No tenderness.  Abdominal:     General: Bowel sounds are normal. There is no distension.     Palpations: Abdomen is soft. There is no mass.     Tenderness: There is abdominal tenderness. There is no guarding or rebound.  Musculoskeletal:        General: No tenderness. Normal range of motion.     Cervical back: Normal range of motion.     Right lower leg: Edema present.     Left lower leg: Edema present.  Lymphadenopathy:     Cervical: No cervical adenopathy.  Skin:    General: Skin is warm and dry.     Findings: No rash.  Neurological:     Mental Status: He is alert and oriented to person, place, and time.     Cranial Nerves: No cranial nerve deficit.     Motor: Weakness present. No abnormal muscle tone.     Coordination: Coordination abnormal.     Gait: Gait abnormal.     Deep Tendon Reflexes: Reflexes are normal and symmetric.  Psychiatric:        Behavior: Behavior normal.        Thought Content: Thought content normal.        Judgment: Judgment normal.   Wounds on shins  Lab Results  Component Value Date   WBC 8.1 01/10/2024   HGB 14.5 01/10/2024   HCT 45.7 01/10/2024   PLT 170.0 01/10/2024    GLUCOSE 93 01/10/2024   CHOL 143 06/25/2022   TRIG 88 06/25/2022   HDL 42 06/25/2022   LDLCALC 83 06/25/2022   ALT 19 01/10/2024   AST 27 01/10/2024   NA 142 01/10/2024   K 4.5 01/10/2024   CL 102 01/10/2024   CREATININE 2.08 (H) 01/10/2024   BUN 31 (H) 01/10/2024   CO2 30 01/10/2024   TSH 1.80 01/10/2024   PSA 2.58 07/05/2022   INR 1.3 (H) 06/23/2022   HGBA1C 6.4 04/06/2023  MICROALBUR 1.2 05/18/2017    VAS US  LOWER EXTREMITY VENOUS (DVT) Result Date: 01/03/2024  Lower Venous DVT Study Patient Name:  Scott Oneal  Date of Exam:   01/03/2024 Medical Rec #: [id number]      Accession #:    [id number] Date of Birth: 1941/06/17     Patient Gender: M Patient Age:   68 years Exam Location:  [facility] Procedure:      VAS US  LOWER EXTREMITY VENOUS (DVT) Referring Phys: Angela Kell --------------------------------------------------------------------------------  Indications: Leg pain and swelling. Triage appt.  Risk Factors: DVT Hx Chronic DVT Left CFV-PTV/Peroneal veins 06/24/22 Left mechanical thrombectomy Left common iliac vein, external iliac vein, common femoral vein, and left femoral vein. Performing Technologist: Parke Boll RVS, RCS  Examination Guidelines: A complete evaluation includes B-mode imaging, spectral Doppler, color Doppler, and power Doppler as needed of all accessible portions of each vessel. Bilateral testing is considered an integral part of a complete examination. Limited examinations for reoccurring indications may be performed as noted. The reflux portion of the exam is performed with the patient in reverse Trendelenburg.  +---------+---------------+---------+-----------+----------+--------------+ LEFT     CompressibilityPhasicitySpontaneityPropertiesThrombus Aging +---------+---------------+---------+-----------+----------+--------------+ CFV      Full                                                         +---------+---------------+---------+-----------+----------+--------------+ SFJ      Full                                                        +---------+---------------+---------+-----------+----------+--------------+ FV Prox  Partial                                      Chronic        +---------+---------------+---------+-----------+----------+--------------+ FV Mid   Partial                                      Chronic        +---------+---------------+---------+-----------+----------+--------------+ FV DistalPartial                                      Chronic        +---------+---------------+---------+-----------+----------+--------------+ POP      Partial                                      Chronic        +---------+---------------+---------+-----------+----------+--------------+ PTV      Partial                                      Chronic        +---------+---------------+---------+-----------+----------+--------------+ PERO     Partial  Chronic        +---------+---------------+---------+-----------+----------+--------------+ GSV      Full                                                        +---------+---------------+---------+-----------+----------+--------------+     Summary: RIGHT: - No evidence of common femoral vein obstruction.   LEFT: - Findings consistent with chronic deep vein thrombosis involving the left femoral vein, left popliteal vein, left posterior tibial veins, and left peroneal veins.  Essentially unchanged since last exam 11/08/2023  *See table(s) above for measurements and observations. Electronically signed by Angela Kell MD on 01/03/2024 at 5:00:13 PM.    Final     Assessment & Plan:   Problem List Items Addressed This Visit     Hypothyroidism   Chronic Cont on Levothroid      Gout   Remote No relapse      Edema   Use Calf Compression Sleeve Start Demadex 20 mg/d       Relevant Orders   Ambulatory referral to Home Health   Ulcer of lower extremity with fat layer exposed (HCC) - Primary   Home Care for wound Care Demadex po for edema Bactroban  oint/bandaid Compr sleeve      Relevant Orders   Ambulatory referral to Home Health      Meds ordered this encounter  Medications   DISCONTD: torsemide (DEMADEX) 20 MG tablet    Sig: Take 1 tablet (20 mg total) by mouth daily.    Dispense:  90 tablet    Refill:  1   DISCONTD: mupirocin  ointment (BACTROBAN ) 2 %    Sig: On leg wound w/dressing change qd or bid    Dispense:  30 g    Refill:  0   mupirocin  ointment (BACTROBAN ) 2 %    Sig: On leg wound w/dressing change qd or bid    Dispense:  30 g    Refill:  0   torsemide (DEMADEX) 20 MG tablet    Sig: Take 1 tablet (20 mg total) by mouth daily.    Dispense:  90 tablet    Refill:  1      Follow-up: No follow-ups on file.  Anitra Barn, MD

## 2024-03-18 ENCOUNTER — Other Ambulatory Visit: Payer: Self-pay

## 2024-03-18 ENCOUNTER — Telehealth: Payer: Self-pay

## 2024-03-18 ENCOUNTER — Emergency Department (HOSPITAL_BASED_OUTPATIENT_CLINIC_OR_DEPARTMENT_OTHER)
Admission: EM | Admit: 2024-03-18 | Discharge: 2024-03-18 | Disposition: A | Discharge status: Home / Self Care | Attending: Emergency Medicine | Admitting: Emergency Medicine

## 2024-03-18 DIAGNOSIS — S31809A Unspecified open wound of unspecified buttock, initial encounter: Secondary | ICD-10-CM | POA: Insufficient documentation

## 2024-03-18 DIAGNOSIS — R21 Rash and other nonspecific skin eruption: Secondary | ICD-10-CM | POA: Diagnosis present

## 2024-03-18 DIAGNOSIS — X58XXXA Exposure to other specified factors, initial encounter: Secondary | ICD-10-CM | POA: Insufficient documentation

## 2024-03-18 DIAGNOSIS — L03115 Cellulitis of right lower limb: Secondary | ICD-10-CM | POA: Diagnosis not present

## 2024-03-18 DIAGNOSIS — I10 Essential (primary) hypertension: Secondary | ICD-10-CM | POA: Diagnosis not present

## 2024-03-18 DIAGNOSIS — I2510 Atherosclerotic heart disease of native coronary artery without angina pectoris: Secondary | ICD-10-CM | POA: Insufficient documentation

## 2024-03-18 DIAGNOSIS — Z7982 Long term (current) use of aspirin: Secondary | ICD-10-CM | POA: Insufficient documentation

## 2024-03-18 MED ORDER — CEPHALEXIN 500 MG PO CAPS: 500.0000 mg | ORAL_CAPSULE | Freq: Four times a day (QID) | ORAL | 0 refills | Status: DC

## 2024-03-18 MED ORDER — BARRIER CREAM NON-SPECIFIED
1.0000 | TOPICAL_CREAM | Freq: Two times a day (BID) | TOPICAL | Status: DC | PRN
  Administered 2024-03-18: 1 via TOPICAL
  Filled 2024-03-18: qty 1

## 2024-03-18 NOTE — Telephone Encounter (Signed)
 Received notification from front desk that patient left VM requesting appointment for cellulitis.   [facility], he says he's concerned he has cellulitis on his left lower leg and that the area is open and bleeding. He denies any fever or chills. States this has been going on for at least a week.   Discussed that next appointment availability is not until June and that due to the open/bleeding wound that it would be best for him to go to the emergency department for quicker evaluation and intervention. Discussed that our office would be happy to follow up with him if the ED recommends it.   He will also reach out to his PCP. Discussed that if his PCP is not able to see him today that he should go to the ED. Patient verbalized understanding and has no further questions.   Jasun Gasparini, BSN, RN

## 2024-03-18 NOTE — ED Provider Notes (Signed)
 Scott Oneal Provider Note   CSN: [id number] Arrival date & time: 03/18/24  1409     History  Chief Complaint  Patient presents with   Recurrent Skin Infections    Scott Oneal is a 83 y.o. male.  With past medical history of coronary artery disease, hypertension, hyperlipidemia, osteoporosis, dermatitis, peripheral vascular disease, history of DVT not on blood thinner, mild reduced EF HF senting to emergency room with complaint of 1 week of left lower anterior shin rash.  He has noticed erythema, warmth and mild drainage to this area.  He was seen by primary care for this started on mupirocin , ruled out DVT and encouraged to take furosemide  daily.  Patient reports that this has slowly and progressively gotten worse.  He also notes pressure wound to buttocks that started about a week and a half ago secondary to what he believes is sleeping in his recliner.  He has not noticed any severe pain or drainage from this area.  Denies any fever or chills.  HPI     Home Medications Prior to Admission medications   Medication Sig Start Date End Date Taking? Authorizing Provider  acetaminophen  (TYLENOL ) 650 MG CR tablet Take 650 mg by mouth in the morning.    [provider]  aspirin  EC 81 MG tablet Take 1 tablet (81 mg total) by mouth daily. Swallow whole. 11/24/23   Arnoldo Lapping, MD  calcitRIOL  (ROCALTROL ) 0.25 MCG capsule Take 1 capsule (0.25 mcg total) by mouth in the morning. 02/05/24   Plotnikov, Aleksei V, MD  carvedilol  (COREG ) 6.25 MG tablet Take 1 tablet (6.25 mg total) by mouth in the morning and at bedtime. 11/24/23   Arnoldo Lapping, MD  Cholecalciferol  (VITAMIN D3) 50 MCG (2000 UT) TABS Take 2,000 Units by mouth daily.    [provider]  furosemide  (LASIX ) 40 MG tablet Take 20 mg by mouth at bedtime. 05/29/23   [provider]  levothyroxine  (SYNTHROID ) 175 MCG tablet Take 1 tablet (175 mcg total) by mouth daily.  01/10/24   Plotnikov, Aleksei V, MD  lipase/protease/amylase (CREON ) 36000 UNITS CPEP capsule Take 1-2 capsules (36,000-72,000 Units total) by mouth 3 (three) times daily before meals. Patient taking differently: Take 72,000 Units by mouth 3 (three) times daily before meals. Per patient taking 2 tablets three times a day 09/05/23   Plotnikov, Aleksei V, MD  Multiple Vitamins-Minerals (PRESERVISION AREDS 2) CAPS Take 1 capsule by mouth in the morning and at bedtime.    [provider]  mupirocin  ointment (BACTROBAN ) 2 % On leg wound w/dressing change qd or bid 03/14/24   Plotnikov, Aleksei V, MD  nitroGLYCERIN  (NITROSTAT ) 0.4 MG SL tablet Place 0.4 mg under the tongue every 5 (five) minutes as needed for chest pain.    [provider]  pantoprazole  (PROTONIX ) 40 MG tablet TAKE 1 TABLET BY MOUTH TWICE A DAY 01/15/24   Plotnikov, Aleksei V, MD  predniSONE  (DELTASONE ) 10 MG tablet Take 1 tablet (10 mg total) by mouth daily with breakfast. Take pc. 01/29/24   Plotnikov, Aleksei V, MD  Propylene Glycol (SYSTANE BALANCE) 0.6 % SOLN Place 1 drop into both eyes 3 (three) times daily as needed (for dryness).    [provider]  Syringe/Needle, Disp, (SYRINGE 3CC/23GX1") 23G X 1" 3 ML MISC Use for IM inj q 2 wks 10/03/23   Plotnikov, Aleksei V, MD  testosterone  cypionate (DEPOTESTOSTERONE CYPIONATE) 200 MG/ML injection INJECT INTO MUSCLE EVERY 2 WEEKS  Patient taking differently: Inject 400 mg into the muscle every 14 (fourteen) days. 10/03/23   Plotnikov, Aleksei V, MD  torsemide  (DEMADEX ) 20 MG tablet Take 1 tablet (20 mg total) by mouth daily. 03/14/24   Plotnikov, Aleksei V, MD  traMADol  (ULTRAM ) 50 MG tablet Take 1 tablet (50 mg total) by mouth every 6 (six) hours as needed for severe pain (pain score 7-10). 01/10/24   Plotnikov, Aleksei V, MD  triamcinolone  cream (KENALOG ) 0.5 % Apply 1 Application topically 3 (three) times daily. 01/29/24 01/28/25  Plotnikov, Aleksei V, MD       Allergies    Biaxin  [clarithromycin ], Lipitor [atorvastatin ], Tizanidine , Allantoin-pramoxine, Amlodipine , Amoxicillin , Benzalkonium chloride, Doxycycline , and Metoprolol tartrate    Review of Systems   Review of Systems  Skin:  Positive for wound.    Physical Exam Updated Vital Signs BP (!) 120/52   Pulse 88   Temp 98 F (36.7 C) (Oral)   Resp 18   Wt 76 kg   SpO2 100%   BMI 22.72 kg/m  Physical Exam Vitals and nursing note reviewed.  Constitutional:      General: He is not in acute distress.    Appearance: He is not toxic-appearing.  HENT:     Head: Normocephalic and atraumatic.  Eyes:     General: No scleral icterus.    Conjunctiva/sclera: Conjunctivae normal.  Cardiovascular:     Rate and Rhythm: Normal rate and regular rhythm.     Pulses: Normal pulses.     Heart sounds: Normal heart sounds.  Pulmonary:     Effort: Pulmonary effort is normal. No respiratory distress.     Breath sounds: Normal breath sounds.  Abdominal:     General: Abdomen is flat. Bowel sounds are normal.     Palpations: Abdomen is soft.     Tenderness: There is no abdominal tenderness.  Musculoskeletal:     Right lower leg: Edema present.     Left lower leg: Edema present.     Comments: Stage I pressure ulcer to above gluteal cleft  Skin:    General: Skin is warm and dry.     Findings: No lesion.  Neurological:     General: No focal deficit present.     Mental Status: He is alert and oriented to person, place, and time. Mental status is at baseline.     ED Results / Procedures / Treatments   Labs (all labs ordered are listed, but only abnormal results are displayed) Labs Reviewed - No data to display  EKG None  Radiology No results found.  Procedures Procedures    Medications Ordered in ED Medications - No data to display  ED Course/ Medical Decision Making/ A&P                                 Medical Decision Making Risk Prescription drug management.   This  patient presents to the ED for concern of lower leg rash, this involves an extensive number of treatment options, and is a complaint that carries with it a high risk of complications and morbidity.  The differential diagnosis includes DVT, cellulitis, PAD, dermatitis, venous stasis, CHF   Problem List / ED Course / Critical interventions / Medication management  Patient presented to emergency room with rash to left lower shin.  It is started weeping and is very small area of redness.  Compartments are soft.  He is neurovascularly intact.  He has lower extremity swelling that is equal bilaterally.  But he denies any significant shortness of breath he is not hypoxic and vitals are stable.  He has not had any fevers or chills with this.  He has not tried any oral antibiotics.  Given he has no sign of systemic illness and is otherwise well-appearing feel he is appropriate for outpatient management oral antibiotic.  He will have wound rechecked in 3 to 5 days with primary care. Provided with barrier cream to use at home for pressure wound to buttocks.  Patient will pick up antibiotics at pharmacy today for cellulitis to anterior shin.  This area was cleaned, dried and wrapped. I have reviewed the patients home medicines and have made adjustments as needed   Plan  F/u w/ PCP in 2-3d to ensure resolution of sx.  Patient was given return precautions. Patient stable for discharge at this time.  Patient educated on sx/dx and verbalized understanding of plan. Return to ER w/ new or worsening sx.          Final Clinical Impression(s) / ED Diagnoses Final diagnoses:  Cellulitis of right lower leg  Wound of buttock, unspecified laterality, initial encounter    Rx / DC Orders ED Discharge Orders          Ordered    cephALEXin  (KEFLEX ) 500 MG capsule  4 times daily,   Status:  Discontinued        03/18/24 1458    cephALEXin  (KEFLEX ) 500 MG capsule  4 times daily        03/18/24 1459               Sharifah Champine, Kandace Organ, PA-C 03/18/24 1617    Sueellen Emery, MD 03/19/24 0710

## 2024-03-18 NOTE — Discharge Instructions (Signed)
 Apply barrier cream to bottom twice daily and keep area clean dry and covered.    I would also recommend taking Keflex  which is an antibiotic please take it for 5 days and call PCP for follow up appointment and wound re-check.   Return to ER if cellulitis is worse or if symptoms continues beyond 5 days of symptoms.

## 2024-03-18 NOTE — ED Notes (Signed)
 Wounds on legs dressing with xeroform, non-adherent telfa, and wrapped in gauze.

## 2024-03-18 NOTE — ED Triage Notes (Signed)
 Left lower obvious skin infection , drainage , presents with dressing .  Also reports possible pressure ulcer to buttock as a result of sleeping on the recliner due to his multiple trips to bathroom every night .  No fever, alert and oriented x 4 .  Hx cellulitis he reported .

## 2024-03-20 DIAGNOSIS — L03115 Cellulitis of right lower limb: Secondary | ICD-10-CM | POA: Diagnosis not present

## 2024-03-20 DIAGNOSIS — I872 Venous insufficiency (chronic) (peripheral): Secondary | ICD-10-CM | POA: Diagnosis not present

## 2024-03-20 DIAGNOSIS — L97812 Non-pressure chronic ulcer of other part of right lower leg with fat layer exposed: Secondary | ICD-10-CM | POA: Diagnosis not present

## 2024-03-20 DIAGNOSIS — I130 Hypertensive heart and chronic kidney disease with heart failure and stage 1 through stage 4 chronic kidney disease, or unspecified chronic kidney disease: Secondary | ICD-10-CM | POA: Diagnosis not present

## 2024-03-20 DIAGNOSIS — K121 Other forms of stomatitis: Secondary | ICD-10-CM | POA: Diagnosis not present

## 2024-03-20 DIAGNOSIS — N184 Chronic kidney disease, stage 4 (severe): Secondary | ICD-10-CM | POA: Diagnosis not present

## 2024-03-20 DIAGNOSIS — I5022 Chronic systolic (congestive) heart failure: Secondary | ICD-10-CM | POA: Diagnosis not present

## 2024-03-20 DIAGNOSIS — F4321 Adjustment disorder with depressed mood: Secondary | ICD-10-CM | POA: Diagnosis not present

## 2024-03-20 DIAGNOSIS — I2510 Atherosclerotic heart disease of native coronary artery without angina pectoris: Secondary | ICD-10-CM | POA: Diagnosis not present

## 2024-03-20 DIAGNOSIS — L03116 Cellulitis of left lower limb: Secondary | ICD-10-CM | POA: Diagnosis not present

## 2024-03-20 DIAGNOSIS — K8689 Other specified diseases of pancreas: Secondary | ICD-10-CM | POA: Diagnosis not present

## 2024-03-26 ENCOUNTER — Ambulatory Visit: Admitting: Podiatry

## 2024-03-26 DIAGNOSIS — L97422 Non-pressure chronic ulcer of left heel and midfoot with fat layer exposed: Secondary | ICD-10-CM

## 2024-03-26 DIAGNOSIS — L97522 Non-pressure chronic ulcer of other part of left foot with fat layer exposed: Secondary | ICD-10-CM | POA: Diagnosis not present

## 2024-03-26 DIAGNOSIS — M79674 Pain in right toe(s): Secondary | ICD-10-CM

## 2024-03-26 DIAGNOSIS — M79675 Pain in left toe(s): Secondary | ICD-10-CM | POA: Diagnosis not present

## 2024-03-26 DIAGNOSIS — B351 Tinea unguium: Secondary | ICD-10-CM | POA: Diagnosis not present

## 2024-03-26 MED ORDER — SANTYL 250 UNIT/GM EX OINT: 1.0000 | TOPICAL_OINTMENT | Freq: Every day | CUTANEOUS | 0 refills | Status: DC

## 2024-03-26 MED ORDER — SANTYL 250 UNIT/GM EX OINT
1.0000 | TOPICAL_OINTMENT | Freq: Every day | CUTANEOUS | 0 refills | Status: DC
Start: 2024-03-26 — End: 2024-03-26

## 2024-03-26 NOTE — Progress Notes (Unsigned)
 Subjective: Chief Complaint  Patient presents with   Foot Ulcer    RM#14 Left foot ulcer and nail trim.    83 year old male presents after the above concerns.  States the wounds are still present on the heels.  He does use offloading pads.  Keeping the dressing on at nighttime he bandages on during the day.  Nails trim dystrophic and elongated cannot diagnose open cause discomfort.   Objective: AAO x3, NAD DP/PT pulses palpable bilaterally, CRT less than 3 seconds To the posterior aspect the right heel appears that the wound has healed.  This is preulcerative however.  There is no drainage or pus identified there is no surrounding erythema, ascending cellulitis.  No fluctuation or crepitation.   On the left posterior heel there is still a skin fissure present appears to be more superficial with minimal granulation tissue present.  There is no probing, undermining or tunneling.  There is no surrounding erythema, ascending cellulitis.  There is no fluctuation, crepitation, malodor.  No obvious signs of infection noted at this site either.   No other open lesions identified at this time.   No pain with calf compression, swelling, warmth, erythema  Assessment: Bilateral heel ulcerations left > right  Plan: Bilateral heel ulcerations -Clean the wounds today.  Antibiotic ointment was applied followed by dressing.  Recommend continue with daily dressing changes, offloading.  Recommended some amount of Vaseline at nighttime.  There is no signs of infection we will continue to monitor closely.  Dispensed gel Achilles sleeves to help decrease pressure.  He has followed up with vascular surgery as well regards to his circulation.  Return in about 6 weeks (around 03/25/2024) for heel ulcer, nail trim .  Charity Conch DPM   L 1 0.5 R  0.5 x 0.5

## 2024-04-04 ENCOUNTER — Telehealth: Payer: Self-pay

## 2024-04-04 NOTE — Telephone Encounter (Signed)
 Copied from CRM [id number]. Topic: Clinical - Home Health Verbal Orders >> Apr 04, 2024  1:12 PM Magdalene School wrote: Caller/Agency: Kiki from [facility] Callback Number: [phone] Service Requested: Skilled Nursing Frequency: twice a week for 8 weeks Any new concerns about the patient? No

## 2024-04-05 NOTE — Telephone Encounter (Signed)
 Copied from CRM [id number]. Topic: Clinical - Medical Advice >> Apr 05, 2024 10:39 AM Allyne Areola wrote: Reason for CRM: Colieen from [facility] home health is calling to verify patient's diagnosis. Best call back number is [phone].

## 2024-04-09 NOTE — Telephone Encounter (Signed)
 Dx: L heel ulcer.  Thanks

## 2024-04-10 NOTE — Telephone Encounter (Signed)
 Copied from CRM [id number]. Topic: Appointments - Appointment Info/Confirmation >> Apr 10, 2024  2:33 PM Luane Rumps D wrote: [facility], requesting more information on type of ulcer diagnosis needed for billing. If someone could give her a call #[phone]

## 2024-04-11 NOTE — Telephone Encounter (Signed)
 Spoke with [facility] and I was informed that Scott Oneal will be out until Monday.  The DX for pt is Ulcer of lower extremity with fat layer exposed, unspecified laterality (HCC)  - L97.902  this is pertaining to pts left foot.

## 2024-04-15 ENCOUNTER — Telehealth: Admitting: Internal Medicine

## 2024-04-16 ENCOUNTER — Ambulatory Visit: Admitting: Podiatry

## 2024-04-17 DIAGNOSIS — H353211 Exudative age-related macular degeneration, right eye, with active choroidal neovascularization: Secondary | ICD-10-CM | POA: Diagnosis not present

## 2024-04-18 ENCOUNTER — Telehealth: Payer: Self-pay

## 2024-04-18 NOTE — Telephone Encounter (Unsigned)
 Copied from CRM [id number]. Topic: Clinical - Home Health Verbal Orders >> Apr 18, 2024  3:25 PM Alyse July wrote: Caller/Agency: [facility] Callback Number: [phone] Service Requested: Physical Therapy Frequency: 2x week for 3 weeks 1 week for 1 week Any new concerns about the patient? No

## 2024-04-19 NOTE — Telephone Encounter (Signed)
 Called and relayed verbal OK on physical therapy orders below.

## 2024-04-19 NOTE — Telephone Encounter (Signed)
 Okay.  Thanks.

## 2024-04-20 ENCOUNTER — Other Ambulatory Visit: Payer: Self-pay | Admitting: Internal Medicine

## 2024-04-23 ENCOUNTER — Ambulatory Visit (INDEPENDENT_AMBULATORY_CARE_PROVIDER_SITE_OTHER): Admitting: Internal Medicine

## 2024-04-23 ENCOUNTER — Encounter: Payer: Self-pay | Admitting: Internal Medicine

## 2024-04-23 VITALS — BP 120/64 | HR 70 | Temp 97.6°F | Ht 72.0 in | Wt 176.0 lb

## 2024-04-23 DIAGNOSIS — M5442 Lumbago with sciatica, left side: Secondary | ICD-10-CM

## 2024-04-23 DIAGNOSIS — F411 Generalized anxiety disorder: Secondary | ICD-10-CM

## 2024-04-23 DIAGNOSIS — M7061 Trochanteric bursitis, right hip: Secondary | ICD-10-CM | POA: Diagnosis not present

## 2024-04-23 DIAGNOSIS — N1832 Chronic kidney disease, stage 3b: Secondary | ICD-10-CM

## 2024-04-23 DIAGNOSIS — I2510 Atherosclerotic heart disease of native coronary artery without angina pectoris: Secondary | ICD-10-CM | POA: Diagnosis not present

## 2024-04-23 DIAGNOSIS — M25551 Pain in right hip: Secondary | ICD-10-CM | POA: Diagnosis not present

## 2024-04-23 DIAGNOSIS — G8929 Other chronic pain: Secondary | ICD-10-CM | POA: Diagnosis not present

## 2024-04-23 DIAGNOSIS — R634 Abnormal weight loss: Secondary | ICD-10-CM

## 2024-04-23 DIAGNOSIS — J4520 Mild intermittent asthma, uncomplicated: Secondary | ICD-10-CM | POA: Diagnosis not present

## 2024-04-23 DIAGNOSIS — E034 Atrophy of thyroid (acquired): Secondary | ICD-10-CM | POA: Diagnosis not present

## 2024-04-23 DIAGNOSIS — M25552 Pain in left hip: Secondary | ICD-10-CM

## 2024-04-23 DIAGNOSIS — L97902 Non-pressure chronic ulcer of unspecified part of unspecified lower leg with fat layer exposed: Secondary | ICD-10-CM

## 2024-04-23 DIAGNOSIS — M2550 Pain in unspecified joint: Secondary | ICD-10-CM

## 2024-04-23 MED ORDER — FUROSEMIDE 40 MG PO TABS: 20.0000 mg | ORAL_TABLET | Freq: Every day | ORAL | Status: DC

## 2024-04-23 NOTE — Assessment & Plan Note (Signed)
Symptoms of angina are controlled with isosorbide.  Continue with Coreg - dose reduced

## 2024-04-23 NOTE — Assessment & Plan Note (Signed)
 Resolving Wt Readings from Last 3 Encounters:  04/23/24 176 lb (79.8 kg)  03/18/24 167 lb 8.8 oz (76 kg)  03/14/24 168 lb (76.2 kg)

## 2024-04-23 NOTE — Assessment & Plan Note (Signed)
 He is not a surgical candidate for any back surgery per Dr. Shon Baton and Dr. Yevette Edwards He has had epidural injections by Dr. Drucie Ip Low back pain -  - severe 7-8/10 all the time. Oxycodone - it stopped working the other day and [name] stopped taking it.  The pain is worse.  He will take Percocet 10/325 up to 4 times a day.  Potential benefits of a long term opioids use as well as potential risks (i.e. addiction risk, apnea etc) and complications (i.e. Somnolence, constipation and others) were explained to the patient and were aknowledged.  Stable - use Tramadol prn  Potential benefits of a long term opioids use as well as potential risks (i.e. addiction risk, apnea etc) and complications (i.e. Somnolence, constipation and others) were explained to the patient and were aknowledged.

## 2024-04-23 NOTE — Assessment & Plan Note (Signed)
 Monitor GFR

## 2024-04-23 NOTE — Assessment & Plan Note (Signed)
OA discussed 

## 2024-04-23 NOTE — Assessment & Plan Note (Addendum)
 Better

## 2024-04-23 NOTE — Assessment & Plan Note (Signed)
Ceftin x 7 d 

## 2024-04-23 NOTE — Assessment & Plan Note (Signed)
 Continue w/wound care w/wound care RN

## 2024-04-23 NOTE — Progress Notes (Signed)
 Subjective:  Patient ID: Scott Oneal, male    DOB: 1941/03/17  Age: 83 y.o. MRN: [id number]  CC: Medical Management of Chronic Issues (4 week f/u)   HPI Scott Oneal presents for poor focus, HTN, celiac disease, edema  Outpatient Medications Prior to Visit  Medication Sig Dispense Refill   acetaminophen  (TYLENOL ) 650 MG CR tablet Take 650 mg by mouth in the morning.     aspirin  EC 81 MG tablet Take 1 tablet (81 mg total) by mouth daily. Swallow whole.     calcitRIOL  (ROCALTROL ) 0.25 MCG capsule Take 1 capsule (0.25 mcg total) by mouth in the morning. 90 capsule 1   carvedilol  (COREG ) 6.25 MG tablet Take 1 tablet (6.25 mg total) by mouth in the morning and at bedtime. 180 tablet 3   Cholecalciferol  (VITAMIN D3) 50 MCG (2000 UT) TABS Take 2,000 Units by mouth daily.     collagenase  (SANTYL ) 250 UNIT/GM ointment Apply 1 Application topically daily. 30 g 0   levothyroxine  (SYNTHROID ) 175 MCG tablet Take 1 tablet (175 mcg total) by mouth daily. 90 tablet 3   lipase/protease/amylase (CREON ) 36000 UNITS CPEP capsule Take 1-2 capsules (36,000-72,000 Units total) by mouth 3 (three) times daily before meals. (Patient taking differently: Take 72,000 Units by mouth 3 (three) times daily before meals. Per patient taking 2 tablets three times a day) 300 capsule 5   Multiple Vitamins-Minerals (PRESERVISION AREDS 2) CAPS Take 1 capsule by mouth in the morning and at bedtime.     mupirocin  ointment (BACTROBAN ) 2 % On leg wound w/dressing change qd or bid 30 g 0   nitroGLYCERIN  (NITROSTAT ) 0.4 MG SL tablet Place 0.4 mg under the tongue every 5 (five) minutes as needed for chest pain.     pantoprazole  (PROTONIX ) 40 MG tablet TAKE 1 TABLET BY MOUTH TWICE A DAY 180 tablet 3   predniSONE  (DELTASONE ) 10 MG tablet Take 1 tablet (10 mg total) by mouth daily with breakfast. Take pc. 100 tablet 1   Propylene Glycol (SYSTANE BALANCE) 0.6 % SOLN Place 1 drop into both eyes 3 (three) times daily as needed (for  dryness).     Syringe/Needle, Disp, (SYRINGE 3CC/23GX1) 23G X 1 3 ML MISC Use for IM inj q 2 wks 50 each 3   testosterone  cypionate (DEPOTESTOSTERONE CYPIONATE) 200 MG/ML injection INJECT INTO MUSCLE EVERY 2 WEEKS (Patient taking differently: Inject 400 mg into the muscle every 14 (fourteen) days.) 3 mL 3   torsemide  (DEMADEX ) 20 MG tablet Take 1 tablet (20 mg total) by mouth daily. 90 tablet 1   traMADol  (ULTRAM ) 50 MG tablet Take 1 tablet (50 mg total) by mouth every 6 (six) hours as needed for severe pain (pain score 7-10). 120 tablet 3   triamcinolone  cream (KENALOG ) 0.5 % Apply 1 Application topically 3 (three) times daily. 45 g 1   cephALEXin  (KEFLEX ) 500 MG capsule Take 1 capsule (500 mg total) by mouth 4 (four) times daily. 20 capsule 0   furosemide  (LASIX ) 40 MG tablet Take 20 mg by mouth at bedtime.     No facility-administered medications prior to visit.    ROS: Review of Systems  Constitutional:  Positive for fatigue. Negative for appetite change and unexpected weight change.  HENT:  Negative for congestion, nosebleeds, sneezing, sore throat and trouble swallowing.   Eyes:  Negative for itching and visual disturbance.  Respiratory:  Negative for cough.   Cardiovascular:  Positive for leg swelling. Negative for chest pain and palpitations.  Gastrointestinal:  Negative for abdominal distention, blood in stool, diarrhea and nausea.  Genitourinary:  Negative for frequency and hematuria.  Musculoskeletal:  Positive for arthralgias, back pain and gait problem. Negative for joint swelling, neck pain and neck stiffness.  Skin:  Negative for rash.  Neurological:  Positive for speech difficulty and weakness. Negative for dizziness and tremors.  Hematological:  Bruises/bleeds easily.  Psychiatric/Behavioral:  Positive for decreased concentration and dysphoric mood. Negative for agitation, sleep disturbance and suicidal ideas. The patient is not nervous/anxious.     Objective:  BP  120/64 (BP Location: Left Arm, Patient Position: Sitting)   Pulse 70   Temp 97.6 F (36.4 C) (Temporal)   Ht 6' (1.829 m)   Wt 176 lb (79.8 kg)   SpO2 98%   BMI 23.87 kg/m   BP Readings from Last 3 Encounters:  04/23/24 120/64  03/18/24 118/60  03/14/24 110/62    Wt Readings from Last 3 Encounters:  04/23/24 176 lb (79.8 kg)  03/18/24 167 lb 8.8 oz (76 kg)  03/14/24 168 lb (76.2 kg)    Physical Exam Constitutional:      General: He is not in acute distress.    Appearance: Normal appearance. He is well-developed. He is not toxic-appearing.     Comments: NAD   Eyes:     Conjunctiva/sclera: Conjunctivae normal.     Pupils: Pupils are equal, round, and reactive to light.   Neck:     Thyroid : No thyromegaly.     Vascular: No JVD.   Cardiovascular:     Rate and Rhythm: Normal rate and regular rhythm.     Heart sounds: Normal heart sounds. No murmur heard.    No friction rub. No gallop.  Pulmonary:     Effort: Pulmonary effort is normal. No respiratory distress.     Breath sounds: Normal breath sounds. No wheezing or rales.  Chest:     Chest wall: No tenderness.  Abdominal:     General: Bowel sounds are normal. There is no distension.     Palpations: Abdomen is soft. There is no mass.     Tenderness: There is no abdominal tenderness. There is no guarding or rebound.   Musculoskeletal:        General: Tenderness present. Normal range of motion.     Cervical back: Normal range of motion.     Right lower leg: Edema present.     Left lower leg: Edema present.  Lymphadenopathy:     Cervical: No cervical adenopathy.   Skin:    General: Skin is warm and dry.     Findings: Bruising present. No erythema or rash.   Neurological:     Mental Status: He is alert and oriented to person, place, and time.     Cranial Nerves: No cranial nerve deficit.     Motor: No abnormal muscle tone.     Coordination: Coordination normal.     Gait: Gait abnormal.     Deep Tendon  Reflexes: Reflexes are normal and symmetric.   Psychiatric:        Behavior: Behavior normal.        Thought Content: Thought content normal.        Judgment: Judgment normal.     Lab Results  Component Value Date   WBC 8.1 01/10/2024   HGB 14.5 01/10/2024   HCT 45.7 01/10/2024   PLT 170.0 01/10/2024   GLUCOSE 93 01/10/2024   CHOL 143 06/25/2022   TRIG 88 06/25/2022  HDL 42 06/25/2022   LDLCALC 83 06/25/2022   ALT 19 01/10/2024   AST 27 01/10/2024   NA 142 01/10/2024   K 4.5 01/10/2024   CL 102 01/10/2024   CREATININE 2.08 (H) 01/10/2024   BUN 31 (H) 01/10/2024   CO2 30 01/10/2024   TSH 1.80 01/10/2024   PSA 2.58 07/05/2022   INR 1.3 (H) 06/23/2022   HGBA1C 6.4 04/06/2023   MICROALBUR 1.2 05/18/2017    No results found.  Assessment & Plan:   Problem List Items Addressed This Visit     Hypothyroidism   Chronic Cont on Levothroid      Coronary artery disease involving native coronary artery of native heart without angina pectoris   Symptoms of angina are controlled with isosorbide .  Continue with Coreg  - dose reduced      Relevant Medications   furosemide  (LASIX ) 40 MG tablet   Arthralgia   OA discussed      Anxiety disorder   Cont on Lexapro       Low back pain   He is not a surgical candidate for any back surgery per Dr. Burnetta and Dr. Beuford He has had epidural injections by Dr. Laqueta Low back pain -  - severe 7-8/10 all the time. Oxycodone  - it stopped working the other day and Lemond stopped taking it.  The pain is worse.  He will take Percocet 10/325 up to 4 times a day.  Potential benefits of a long term opioids use as well as potential risks (i.e. addiction risk, apnea etc) and complications (i.e. Somnolence, constipation and others) were explained to the patient and were aknowledged.  Stable - use Tramadol  prn  Potential benefits of a long term opioids use as well as potential risks (i.e. addiction risk, apnea etc) and complications (i.e.  Somnolence, constipation and others) were explained to the patient and were aknowledged.      Asthmatic bronchitis   Ceftin  x 7 d      Bursitis of hip, right   Better      CKD (chronic kidney disease) stage 3, GFR 30-59 ml/min (HCC)   Monitor GFR      Relevant Orders   Hemoglobin A1c   Comprehensive metabolic panel with GFR   Hip pain   Better      Weight loss - Primary   Resolving Wt Readings from Last 3 Encounters:  04/23/24 176 lb (79.8 kg)  03/18/24 167 lb 8.8 oz (76 kg)  03/14/24 168 lb (76.2 kg)         Ulcer of lower extremity with fat layer exposed (HCC)   Continue w/wound care w/wound care RN         Meds ordered this encounter  Medications   furosemide  (LASIX ) 40 MG tablet    Sig: Take 0.5 tablets (20 mg total) by mouth daily.      Follow-up: Return in about 2 months (around 06/23/2024) for a follow-up visit.  Marolyn Noel, MD

## 2024-04-23 NOTE — Assessment & Plan Note (Signed)
 Better

## 2024-04-23 NOTE — Assessment & Plan Note (Signed)
 Cont on Lexapro

## 2024-04-23 NOTE — Assessment & Plan Note (Signed)
Chronic Cont on Levothroid

## 2024-04-26 ENCOUNTER — Other Ambulatory Visit: Payer: Self-pay | Admitting: Podiatry

## 2024-04-29 ENCOUNTER — Telehealth: Payer: Self-pay | Admitting: Internal Medicine

## 2024-04-29 NOTE — Telephone Encounter (Unsigned)
 Copied from CRM [id number]. Topic: Clinical - Home Health Verbal Orders >> Apr 29, 2024  2:32 PM Thersia BROCKS wrote: Caller/Agency: Lita Rushing Number: [phone] Service Requested: Skilled Nursing Frequency: 2 a week for 4 weeks for wound care  Any new concerns about the patient? No

## 2024-05-02 ENCOUNTER — Other Ambulatory Visit: Payer: Self-pay | Admitting: Internal Medicine

## 2024-05-02 NOTE — Telephone Encounter (Signed)
 Okay.  Thanks.

## 2024-05-02 NOTE — Telephone Encounter (Signed)
 Verbals given today.

## 2024-05-09 DIAGNOSIS — I2510 Atherosclerotic heart disease of native coronary artery without angina pectoris: Secondary | ICD-10-CM | POA: Diagnosis not present

## 2024-05-09 DIAGNOSIS — F4321 Adjustment disorder with depressed mood: Secondary | ICD-10-CM | POA: Diagnosis not present

## 2024-05-09 DIAGNOSIS — K8689 Other specified diseases of pancreas: Secondary | ICD-10-CM | POA: Diagnosis not present

## 2024-05-09 DIAGNOSIS — K121 Other forms of stomatitis: Secondary | ICD-10-CM | POA: Diagnosis not present

## 2024-05-09 DIAGNOSIS — L03115 Cellulitis of right lower limb: Secondary | ICD-10-CM | POA: Diagnosis not present

## 2024-05-09 DIAGNOSIS — I5022 Chronic systolic (congestive) heart failure: Secondary | ICD-10-CM | POA: Diagnosis not present

## 2024-05-09 DIAGNOSIS — I872 Venous insufficiency (chronic) (peripheral): Secondary | ICD-10-CM | POA: Diagnosis not present

## 2024-05-09 DIAGNOSIS — N184 Chronic kidney disease, stage 4 (severe): Secondary | ICD-10-CM | POA: Diagnosis not present

## 2024-05-09 DIAGNOSIS — M353 Polymyalgia rheumatica: Secondary | ICD-10-CM | POA: Diagnosis not present

## 2024-05-09 DIAGNOSIS — L03116 Cellulitis of left lower limb: Secondary | ICD-10-CM | POA: Diagnosis not present

## 2024-05-09 DIAGNOSIS — L97812 Non-pressure chronic ulcer of other part of right lower leg with fat layer exposed: Secondary | ICD-10-CM | POA: Diagnosis not present

## 2024-05-09 DIAGNOSIS — I130 Hypertensive heart and chronic kidney disease with heart failure and stage 1 through stage 4 chronic kidney disease, or unspecified chronic kidney disease: Secondary | ICD-10-CM | POA: Diagnosis not present

## 2024-05-14 ENCOUNTER — Ambulatory Visit (HOSPITAL_BASED_OUTPATIENT_CLINIC_OR_DEPARTMENT_OTHER): Admitting: Internal Medicine

## 2024-05-16 ENCOUNTER — Ambulatory Visit: Payer: Self-pay

## 2024-05-16 NOTE — Telephone Encounter (Signed)
 FYI Only or Action Required?: FYI only for provider.  Patient was last seen in primary care on 04/23/2024 by Plotnikov, Karlynn GAILS, MD.  Called Nurse Triage reporting Leg Swelling.  Symptoms began several weeks ago.  Interventions attempted: Nothing.  Symptoms are: gradually worsening.  Triage Disposition: See PCP When Office is Open (Within 3 Days)  Patient/caregiver understands and will follow disposition?: No  Copied from CRM [id number]. Topic: Clinical - Red Word Triage >> May 16, 2024  4:21 PM Jayma L wrote: Red Word that prompted transfer to Nurse Triage: patient called in stated he is having issues with both legs leaking and he has to keep a bandage on it, said the right side is getting worse and there is some swelling in his legs, asking to be seen or get a referral . Reason for Disposition  [1] MILD swelling of both ankles (i.e., pedal edema) AND [2] new-onset or getting worse  Answer Assessment - Initial Assessment Questions 1. ONSET: When did the swelling start? (e.g., minutes, hours, days)     More than a week 2. LOCATION: What part of the leg is swollen?  Are both legs swollen or just one leg?     Bilateral legs 3. SEVERITY: How bad is the swelling? (e.g., localized; mild, moderate, severe)     mild 4. REDNESS: Is there redness or signs of infection?     Red around where open wound was  5. PAIN: Is the swelling painful to touch? If Yes, ask: How painful is it?   (Scale 1-10; mild, moderate or severe)     denies 6. FEVER: Do you have a fever? If Yes, ask: What is it, how was it measured, and when did it start?      denies 7. CAUSE: What do you think is causing the leg swelling?     Unsure, states that nurse at his facility thinks it may be vascular 8. MEDICAL HISTORY: Do you have a history of blood clots (e.g., DVT), cancer, heart failure, kidney disease, or liver failure?     denies 9. RECURRENT SYMPTOM: Have you had leg swelling before? If  Yes, ask: When was the last time? What happened that time?     denies 10. OTHER SYMPTOMS: Do you have any other symptoms? (e.g., chest pain, difficulty breathing)       Denies  Attempted to schedule earlier appt, pt only wanted to see pcp.  Protocols used: Leg Swelling and Edema-A-AH

## 2024-05-20 DIAGNOSIS — N320 Bladder-neck obstruction: Secondary | ICD-10-CM | POA: Diagnosis not present

## 2024-05-20 DIAGNOSIS — I129 Hypertensive chronic kidney disease with stage 1 through stage 4 chronic kidney disease, or unspecified chronic kidney disease: Secondary | ICD-10-CM | POA: Diagnosis not present

## 2024-05-20 DIAGNOSIS — Z6841 Body Mass Index (BMI) 40.0 and over, adult: Secondary | ICD-10-CM | POA: Diagnosis not present

## 2024-05-20 DIAGNOSIS — N183 Chronic kidney disease, stage 3 unspecified: Secondary | ICD-10-CM | POA: Diagnosis not present

## 2024-05-20 DIAGNOSIS — R319 Hematuria, unspecified: Secondary | ICD-10-CM | POA: Diagnosis not present

## 2024-05-22 DIAGNOSIS — M170 Bilateral primary osteoarthritis of knee: Secondary | ICD-10-CM | POA: Diagnosis not present

## 2024-05-23 ENCOUNTER — Encounter: Payer: Self-pay | Admitting: Internal Medicine

## 2024-05-23 ENCOUNTER — Ambulatory Visit: Admitting: Internal Medicine

## 2024-05-23 VITALS — BP 116/80 | HR 84 | Temp 98.3°F | Ht 72.0 in | Wt 186.0 lb

## 2024-05-23 DIAGNOSIS — I5042 Chronic combined systolic (congestive) and diastolic (congestive) heart failure: Secondary | ICD-10-CM

## 2024-05-23 DIAGNOSIS — K900 Celiac disease: Secondary | ICD-10-CM | POA: Diagnosis not present

## 2024-05-23 DIAGNOSIS — B079 Viral wart, unspecified: Secondary | ICD-10-CM | POA: Diagnosis not present

## 2024-05-23 DIAGNOSIS — R269 Unspecified abnormalities of gait and mobility: Secondary | ICD-10-CM

## 2024-05-23 DIAGNOSIS — M353 Polymyalgia rheumatica: Secondary | ICD-10-CM | POA: Diagnosis not present

## 2024-05-23 DIAGNOSIS — R634 Abnormal weight loss: Secondary | ICD-10-CM

## 2024-05-23 DIAGNOSIS — N184 Chronic kidney disease, stage 4 (severe): Secondary | ICD-10-CM | POA: Diagnosis not present

## 2024-05-23 DIAGNOSIS — N179 Acute kidney failure, unspecified: Secondary | ICD-10-CM

## 2024-05-23 LAB — COMPREHENSIVE METABOLIC PANEL WITH GFR
ALT: 20 U/L (ref 0–53)
AST: 16 U/L (ref 0–37)
Albumin: 4 g/dL (ref 3.5–5.2)
Alkaline Phosphatase: 59 U/L (ref 39–117)
BUN: 57 mg/dL — ABNORMAL HIGH (ref 6–23)
CO2: 35 meq/L — ABNORMAL HIGH (ref 19–32)
Calcium: 9.4 mg/dL (ref 8.4–10.5)
Chloride: 100 meq/L (ref 96–112)
Creatinine, Ser: 2.65 mg/dL — ABNORMAL HIGH (ref 0.40–1.50)
GFR: 21.74 mL/min — ABNORMAL LOW (ref >60.00)
Glucose, Bld: 134 mg/dL — ABNORMAL HIGH (ref 70–99)
Potassium: 4.8 meq/L (ref 3.5–5.1)
Sodium: 142 meq/L (ref 135–145)
Total Bilirubin: 0.5 mg/dL (ref 0.2–1.2)
Total Protein: 6.4 g/dL (ref 6.0–8.3)

## 2024-05-23 NOTE — Assessment & Plan Note (Signed)
 Feeling better off Lipitor, on prednisone, off Flomax, on strict gluten free diet

## 2024-05-23 NOTE — Assessment & Plan Note (Signed)
See cryo 

## 2024-05-23 NOTE — Assessment & Plan Note (Signed)
Hydrate well Monitor CMET 

## 2024-05-23 NOTE — Patient Instructions (Signed)
   Postprocedure instructions :     Keep the wounds clean. You can wash them with liquid soap and water. Pat dry with gauze or a Kleenex tissue  Before applying antibiotic ointment and a Band-Aid.   You need to report immediately  if  any signs of infection develop.    

## 2024-05-23 NOTE — Progress Notes (Signed)
 Subjective:  Patient ID: Scott Oneal, male    DOB: [birth year]  Age: 83 y.o. MRN: [id number]  CC: Leg Swelling   HPI Scott Oneal presents for neuropathy, weakness, celiac disease, chronic edema L>R LE  Outpatient Medications Prior to Visit  Medication Sig Dispense Refill   acetaminophen  (TYLENOL ) 650 MG CR tablet Take 650 mg by mouth in the morning.     aspirin  EC 81 MG tablet Take 1 tablet (81 mg total) by mouth daily. Swallow whole.     calcitRIOL  (ROCALTROL ) 0.25 MCG capsule Take 1 capsule (0.25 mcg total) by mouth in the morning. 90 capsule 1   carvedilol  (COREG ) 6.25 MG tablet Take 1 tablet (6.25 mg total) by mouth in the morning and at bedtime. 180 tablet 3   Cholecalciferol  (VITAMIN D3) 50 MCG (2000 UT) TABS Take 2,000 Units by mouth daily.     clotrimazole -betamethasone  (LOTRISONE ) cream APPLY 1 APPLICATION TOPICALLY DAILY 30 g 0   collagenase  (SANTYL ) 250 UNIT/GM ointment Apply 1 Application topically daily. 30 g 0   furosemide  (LASIX ) 40 MG tablet Take 0.5 tablets (20 mg total) by mouth daily.     levothyroxine  (SYNTHROID ) 175 MCG tablet Take 1 tablet (175 mcg total) by mouth daily. 90 tablet 3   lipase/protease/amylase (CREON ) 36000 UNITS CPEP capsule Take 1-2 capsules (36,000-72,000 Units total) by mouth 3 (three) times daily before meals. (Patient taking differently: Take 72,000 Units by mouth 3 (three) times daily before meals. Per patient taking 2 tablets three times a day) 300 capsule 5   Multiple Vitamins-Minerals (PRESERVISION AREDS 2) CAPS Take 1 capsule by mouth in the morning and at bedtime.     mupirocin  ointment (BACTROBAN ) 2 % On leg wound w/dressing change qd or bid 30 g 0   nitroGLYCERIN  (NITROSTAT ) 0.4 MG SL tablet Place 0.4 mg under the tongue every 5 (five) minutes as needed for chest pain.     pantoprazole  (PROTONIX ) 40 MG tablet TAKE 1 TABLET BY MOUTH TWICE A DAY 180 tablet 3   predniSONE  (DELTASONE ) 10 MG tablet Take 1 tablet (10 mg total) by mouth daily  with breakfast. Take pc. 100 tablet 1   Propylene Glycol (SYSTANE BALANCE) 0.6 % SOLN Place 1 drop into both eyes 3 (three) times daily as needed (for dryness).     Syringe/Needle, Disp, (SYRINGE 3CC/23GX1) 23G X 1 3 ML MISC Use for IM inj q 2 wks 50 each 3   testosterone  cypionate (DEPOTESTOSTERONE CYPIONATE) 200 MG/ML injection INJECT INTO MUSCLE EVERY 2 WEEKS 4 mL 5   torsemide  (DEMADEX ) 20 MG tablet Take 1 tablet (20 mg total) by mouth daily. 90 tablet 1   traMADol  (ULTRAM ) 50 MG tablet Take 1 tablet (50 mg total) by mouth every 6 (six) hours as needed for severe pain (pain score 7-10). 120 tablet 3   triamcinolone  cream (KENALOG ) 0.5 % Apply 1 Application topically 3 (three) times daily. 45 g 1   No facility-administered medications prior to visit.    ROS: Review of Systems  Constitutional:  Positive for fatigue. Negative for appetite change and unexpected weight change.  HENT:  Negative for congestion, nosebleeds, sneezing, sore throat and trouble swallowing.   Eyes:  Negative for itching and visual disturbance.  Respiratory:  Negative for cough.   Cardiovascular:  Positive for leg swelling. Negative for chest pain and palpitations.  Gastrointestinal:  Negative for abdominal distention, blood in stool, diarrhea and nausea.  Genitourinary:  Negative for frequency and hematuria.  Musculoskeletal:  Negative for back pain, gait problem, joint swelling and neck pain.  Skin:  Positive for color change, rash and wound.  Neurological:  Positive for weakness. Negative for dizziness, tremors and speech difficulty.  Hematological:  Bruises/bleeds easily.  Psychiatric/Behavioral:  Positive for decreased concentration. Negative for agitation, dysphoric mood, sleep disturbance and suicidal ideas. The patient is not nervous/anxious.     Objective:  BP 116/80 (BP Location: Left Arm, Patient Position: Sitting, Cuff Size: Normal)   Pulse 84   Temp 98.3 F (36.8 C) (Oral)   Ht 6' (1.829 m)    Wt 186 lb (84.4 kg)   SpO2 99%   BMI 25.23 kg/m   BP Readings from Last 3 Encounters:  05/23/24 116/80  04/23/24 120/64  03/18/24 118/60    Wt Readings from Last 3 Encounters:  05/23/24 186 lb (84.4 kg)  04/23/24 176 lb (79.8 kg)  03/18/24 167 lb 8.8 oz (76 kg)    Physical Exam Constitutional:      General: He is not in acute distress.    Appearance: Normal appearance. He is well-developed. He is not toxic-appearing.     Comments: NAD  Eyes:     Conjunctiva/sclera: Conjunctivae normal.     Pupils: Pupils are equal, round, and reactive to light.  Neck:     Thyroid : No thyromegaly.     Vascular: No JVD.  Cardiovascular:     Rate and Rhythm: Normal rate and regular rhythm.     Heart sounds: Normal heart sounds. No murmur heard.    No friction rub. No gallop.  Pulmonary:     Effort: Pulmonary effort is normal. No respiratory distress.     Breath sounds: Normal breath sounds. No wheezing, rhonchi or rales.  Chest:     Chest wall: No tenderness.  Abdominal:     General: Bowel sounds are normal. There is no distension.     Palpations: Abdomen is soft. There is no mass.     Tenderness: There is no abdominal tenderness. There is no guarding or rebound.  Musculoskeletal:        General: Tenderness present. Normal range of motion.     Cervical back: Normal range of motion.     Right lower leg: Edema present.     Left lower leg: Edema present.  Lymphadenopathy:     Cervical: No cervical adenopathy.  Skin:    General: Skin is warm and dry.     Findings: No rash.  Neurological:     Mental Status: He is alert and oriented to person, place, and time.     Cranial Nerves: No cranial nerve deficit.     Motor: No weakness or abnormal muscle tone.     Coordination: Coordination abnormal.     Gait: Gait abnormal.     Deep Tendon Reflexes: Reflexes are normal and symmetric.  Psychiatric:        Behavior: Behavior normal.        Thought Content: Thought content normal.         Judgment: Judgment normal.   L>R edema Wounds - dressed  Warts on hands   Procedure Note :     Procedure : Cryosurgery   Indication:  Wart(s)  Actinic keratosis(es)   Risks including unsuccessful procedure , bleeding, infection, bruising, scar, a need for a repeat  procedure and others were explained to the patient in detail as well as the benefits. Informed consent was obtained verbally.    2 lesion(s) on B hands  was/were treated with liquid nitrogen on a Q-tip in a usual fasion . Band-Aid was applied and antibiotic ointment was given for a later use.   Tolerated well. Complications none.   Postprocedure instructions :     Keep the wounds clean. You can wash them with liquid soap and water . Pat dry with gauze or a Kleenex tissue  Before applying antibiotic ointment and a Band-Aid.   You need to report immediately  if  any signs of infection develop.    Lab Results  Component Value Date   WBC 8.1 01/10/2024   HGB 14.5 01/10/2024   HCT 45.7 01/10/2024   PLT 170.0 01/10/2024   GLUCOSE 93 01/10/2024   CHOL 143 06/25/2022   TRIG 88 06/25/2022   HDL 42 06/25/2022   LDLCALC 83 06/25/2022   ALT 19 01/10/2024   AST 27 01/10/2024   NA 142 01/10/2024   K 4.5 01/10/2024   CL 102 01/10/2024   CREATININE 2.08 (H) 01/10/2024   BUN 31 (H) 01/10/2024   CO2 30 01/10/2024   TSH 1.80 01/10/2024   PSA 2.58 07/05/2022   INR 1.3 (H) 06/23/2022   HGBA1C 6.4 04/06/2023    No results found.  Assessment & Plan:   Problem List Items Addressed This Visit     Acute on chronic kidney failure (HCC) - Primary   Hydrate well Monitor CMET      Celiac disease   Resume strict gluten free diet      Relevant Orders   Comprehensive metabolic panel with GFR   Congestive heart failure (HCC)   Cont on Rx      Gait disorder   Better, stronger      PMR (polymyalgia rheumatica) (HCC)   Feeling better off Lipitor, on prednisone , off Flomax , on strict gluten free diet      Warts    See cryo      WEIGHT LOSS   Wt Readings from Last 3 Encounters:  05/23/24 186 lb (84.4 kg)  04/23/24 176 lb (79.8 kg)  03/18/24 167 lb 8.8 oz (76 kg)  Resolved          No orders of the defined types were placed in this encounter.     Follow-up: No follow-ups on file.  Marolyn Noel, MD

## 2024-05-23 NOTE — Assessment & Plan Note (Signed)
Resume strict gluten-free diet

## 2024-05-23 NOTE — Assessment & Plan Note (Signed)
Cont on  Rx

## 2024-05-23 NOTE — Assessment & Plan Note (Signed)
 Wt Readings from Last 3 Encounters:  05/23/24 186 lb (84.4 kg)  04/23/24 176 lb (79.8 kg)  03/18/24 167 lb 8.8 oz (76 kg)  Resolved

## 2024-05-23 NOTE — Assessment & Plan Note (Signed)
 Better, stronger

## 2024-05-26 ENCOUNTER — Ambulatory Visit: Payer: Self-pay | Admitting: Internal Medicine

## 2024-05-27 ENCOUNTER — Other Ambulatory Visit: Payer: Self-pay | Admitting: Internal Medicine

## 2024-05-28 NOTE — Telephone Encounter (Signed)
 Unable to find Marquay Kruse in the system with the same address as this patient

## 2024-06-07 ENCOUNTER — Other Ambulatory Visit: Payer: Self-pay

## 2024-06-07 ENCOUNTER — Telehealth: Payer: Self-pay

## 2024-06-07 ENCOUNTER — Telehealth: Payer: Self-pay | Admitting: Cardiovascular Disease

## 2024-06-07 DIAGNOSIS — R600 Localized edema: Secondary | ICD-10-CM

## 2024-06-07 MED ORDER — FUROSEMIDE 40 MG PO TABS: ORAL_TABLET | ORAL | 0 refills | Status: DC

## 2024-06-07 NOTE — Telephone Encounter (Addendum)
 Pt leff message on [facility] Triage line reporting blisters on bilateral ankles.  Returned call to pt, no answer, left message to call back.  [id number] Patient returned call.  After discussing his symptoms, I recommended he call his cardiologist and then follow up with [facility].   Patient knows to elevate his legs above his heart, wear mild compression and keep his blisters clean and dry.  [id number] Called patient back to reinforce calling his cardiologist. Pt stated he had just gotten off the phone with cardiologist's office.

## 2024-06-07 NOTE — Telephone Encounter (Signed)
 Patient calling about the blisters that right above his ankles.. Please advise

## 2024-06-07 NOTE — Telephone Encounter (Signed)
 Difficult to know what to advise without seeing him. If this is fluid related and he has leg edema, he could increase furosemide  to 40 mg daily. Should have BMET in 2-3 weeks if he makes this change in his diuretic. thanks

## 2024-06-07 NOTE — Addendum Note (Signed)
 Addended by: GORDON RONAL SQUIBB on: 06/07/2024 12:20 PM   Modules accepted: Orders

## 2024-06-07 NOTE — Telephone Encounter (Signed)
 Spoke with pt about bilateral ankle blisters. Pt stated his nurse at [facility] has wrapped his legs but he wanted to reach out because they haven't resolved. Pt stated these blisters have occurred before and they went away but have now come back. Pt called Dr. Cleda office and was advised to contact us  to discuss this issue. Pt wants to know if he needs to do anything specific. Explained that I will forward to Dr. Wonda to review and our office will be back in touch.

## 2024-06-07 NOTE — Telephone Encounter (Signed)
 Spoke with pt and asked if he has been experiencing swelling in his legs along with the blisters. Pt stated he was. Explained that Dr. Wonda stated we could increase his daily Lasix  dose to 40 mg once daily and then do repeat labs in 2-3 weeks. Pt agreed to this plan and had a nurse from [place] speak with me on the phone to go over this information. Nurse asked for us  to send in a prescription for the increased Lasix  to [facility] [phone] and also send over the lab orders to [facility] [phone] for them to be able to obtain in 2-3 weeks.

## 2024-06-12 ENCOUNTER — Other Ambulatory Visit: Payer: Self-pay | Admitting: Internal Medicine

## 2024-06-19 NOTE — Telephone Encounter (Signed)
 Spoke with pt regarding his swelling and blisters above his ankles. Pt stated he has been taking 40 mg of lasix  daily for two weeks as Dr. Wonda suggested but has not seen any improvement. Pt stated his right leg is wrapped due to the weeping blisters. Pt has an appointment with his PCP on 8/26. Pt denies shortness of breath or chest pain. Pt was told that if he begins to have shortness of breath especially at rest that he should call 911 or go to the ED. Pt was told Dr. Wonda would be notified. Pt verbalized understanding. All questions if any were answered.

## 2024-06-19 NOTE — Telephone Encounter (Signed)
 Patient is following up. He says he adjusted medication as advised and hasn't seen any improvement. Legs are still swollen from the knee down with blistering. Please advise.

## 2024-06-20 NOTE — Telephone Encounter (Signed)
 Patient is calling back for update. Please advise

## 2024-06-20 NOTE — Telephone Encounter (Signed)
 Looks like he's seeing Dr Garald next week for evaluation. I'd be happy to see him if the fluid issue is felt to be cardiac-related. With lasix  not helping, it may be related to venous insufficiency. Please let me know if you'd like me to arrange an appt with me or one of my APP's. thanks

## 2024-06-20 NOTE — Telephone Encounter (Signed)
 Called pt, he states this morning he was given Lasix  40 mg.  Pt aware he was to be taking 40 mg for 14 days then 20 mg again. Pt would like a note saying he is back to 20 mg.  Attempted to call the facility. No answer. Left a message for them to return the call.

## 2024-06-25 ENCOUNTER — Encounter: Payer: Self-pay | Admitting: Pharmacist

## 2024-06-25 ENCOUNTER — Ambulatory Visit: Admitting: Internal Medicine

## 2024-06-25 ENCOUNTER — Encounter: Payer: Self-pay | Admitting: Internal Medicine

## 2024-06-25 VITALS — BP 118/78 | HR 67 | Temp 98.0°F | Ht 72.0 in | Wt 186.0 lb

## 2024-06-25 DIAGNOSIS — F411 Generalized anxiety disorder: Secondary | ICD-10-CM

## 2024-06-25 DIAGNOSIS — I5042 Chronic combined systolic (congestive) and diastolic (congestive) heart failure: Secondary | ICD-10-CM | POA: Diagnosis not present

## 2024-06-25 DIAGNOSIS — M353 Polymyalgia rheumatica: Secondary | ICD-10-CM | POA: Diagnosis not present

## 2024-06-25 DIAGNOSIS — M48062 Spinal stenosis, lumbar region with neurogenic claudication: Secondary | ICD-10-CM | POA: Diagnosis not present

## 2024-06-25 DIAGNOSIS — R197 Diarrhea, unspecified: Secondary | ICD-10-CM

## 2024-06-25 DIAGNOSIS — N184 Chronic kidney disease, stage 4 (severe): Secondary | ICD-10-CM

## 2024-06-25 DIAGNOSIS — B079 Viral wart, unspecified: Secondary | ICD-10-CM

## 2024-06-25 MED ORDER — PREDNISONE 10 MG PO TABS: 10.0000 mg | ORAL_TABLET | Freq: Every day | ORAL | 1 refills | Status: AC

## 2024-06-25 MED ORDER — GABAPENTIN 300 MG PO CAPS
300.0000 mg | ORAL_CAPSULE | Freq: Two times a day (BID) | ORAL | 5 refills | Status: AC
Start: 2024-06-25 — End: ?

## 2024-06-25 NOTE — Progress Notes (Signed)
 Pharmacy Quality Measure Review   This patient is appearing on a report for being at risk of failing the adherence measure for cholesterol (statin) medications this calendar year.   Medication: Atorvastatin  40mg   Last fill date: 01/24/2024 for 90 day supply   MyChart message sent to patient. He confirmed that he is no longer taking this medication. Atorvastatin  discontinued 12/2022. Med was filled only once this year therefore he won't be included in the statin measure.  Darrelyn Drum, PharmD, BCPS, CPP Clinical Pharmacist Practitioner [facility] [phone]   Lum Ricks, PharmD Candidate  [facility] [employer]

## 2024-06-25 NOTE — Assessment & Plan Note (Signed)
 Hydrate well

## 2024-06-25 NOTE — Assessment & Plan Note (Signed)
 Feeling better off Lipitor, on prednisone, off Flomax, on strict gluten free diet

## 2024-06-25 NOTE — Assessment & Plan Note (Signed)
 Continue on Creon , gluten free diet

## 2024-06-25 NOTE — Assessment & Plan Note (Signed)
L sciatica: getting epidural inj x2 - Dr Drucie Ip Due in May On Tramadol prn  Pain is >5-8/10: pt wants to try Prednisone.Marland KitchenMarland KitchenMarland KitchenStart 10 mg/d  Potential benefits of a long term steroid  use as well as potential risks  and complications were explained to the patient and were aknowledged.

## 2024-06-25 NOTE — Progress Notes (Signed)
 Subjective:  Patient ID: Scott Oneal, male    DOB: 04-19-1941  Age: 83 y.o. MRN: [id number]  CC: Medical Management of Chronic Issues (2 mnth f/u)   HPI Scott Oneal presents for neuropathy, edema, PMR, PAD Gabapentin  helped some (200 mg at hs)  Outpatient Medications Prior to Visit  Medication Sig Dispense Refill   acetaminophen  (TYLENOL ) 650 MG CR tablet Take 650 mg by mouth in the morning.     aspirin  EC 81 MG tablet Take 1 tablet (81 mg total) by mouth daily. Swallow whole.     calcitRIOL  (ROCALTROL ) 0.25 MCG capsule Take 1 capsule (0.25 mcg total) by mouth in the morning. 90 capsule 1   carvedilol  (COREG ) 6.25 MG tablet Take 1 tablet (6.25 mg total) by mouth in the morning and at bedtime. 180 tablet 3   Cholecalciferol  (VITAMIN D3) 50 MCG (2000 UT) TABS Take 2,000 Units by mouth daily.     clotrimazole -betamethasone  (LOTRISONE ) cream APPLY 1 APPLICATION TOPICALLY DAILY 30 g 0   collagenase  (SANTYL ) 250 UNIT/GM ointment Apply 1 Application topically daily. 30 g 0   furosemide  (LASIX ) 40 MG tablet Take 1 tablet (40 mg total) by mouth daily for 14 days, THEN 0.5 tablets (20 mg total) daily. 90 tablet 0   levothyroxine  (SYNTHROID ) 175 MCG tablet Take 1 tablet (175 mcg total) by mouth daily. 90 tablet 3   lipase/protease/amylase (CREON ) 36000 UNITS CPEP capsule Take 1-2 capsules (36,000-72,000 Units total) by mouth 3 (three) times daily before meals. (Patient taking differently: Take 72,000 Units by mouth 3 (three) times daily before meals. Per patient taking 2 tablets three times a day) 300 capsule 5   Multiple Vitamins-Minerals (PRESERVISION AREDS 2) CAPS Take 1 capsule by mouth in the morning and at bedtime.     mupirocin  ointment (BACTROBAN ) 2 % On leg wound w/dressing change qd or bid 30 g 0   nitroGLYCERIN  (NITROSTAT ) 0.4 MG SL tablet Place 0.4 mg under the tongue every 5 (five) minutes as needed for chest pain.     pantoprazole  (PROTONIX ) 40 MG tablet TAKE 1 TABLET BY MOUTH  TWICE A DAY 180 tablet 3   Propylene Glycol (SYSTANE BALANCE) 0.6 % SOLN Place 1 drop into both eyes 3 (three) times daily as needed (for dryness).     Syringe/Needle, Disp, (SYRINGE 3CC/23GX1) 23G X 1 3 ML MISC Use for IM inj q 2 wks 50 each 3   testosterone  cypionate (DEPOTESTOSTERONE CYPIONATE) 200 MG/ML injection INJECT INTO MUSCLE EVERY 2 WEEKS 4 mL 5   torsemide  (DEMADEX ) 20 MG tablet Take 1 tablet (20 mg total) by mouth daily. 90 tablet 1   traMADol  (ULTRAM ) 50 MG tablet Take 1 tablet (50 mg total) by mouth every 6 (six) hours as needed for severe pain (pain score 7-10). 120 tablet 3   triamcinolone  cream (KENALOG ) 0.5 % Apply 1 Application topically 3 (three) times daily. 45 g 1   gabapentin  (NEURONTIN ) 100 MG capsule Take 100-200 mg by mouth at bedtime.     predniSONE  (DELTASONE ) 10 MG tablet TAKE 4 TABS A DAY X 3 DAYS THEN 3 TABS A DAY X 4 DAYS THEN 2 TABS A DAY X 4 DAYS, THEN 1 TAB A DAY. TAKE PC. 100 tablet 1   No facility-administered medications prior to visit.    ROS: Review of Systems  Constitutional:  Negative for appetite change, fatigue and unexpected weight change.  HENT:  Negative for congestion, nosebleeds, sneezing, sore throat and trouble swallowing.  Eyes:  Negative for itching and visual disturbance.  Respiratory:  Negative for cough.   Cardiovascular:  Positive for leg swelling. Negative for chest pain and palpitations.  Gastrointestinal:  Negative for abdominal distention, blood in stool, diarrhea and nausea.  Genitourinary:  Negative for frequency and hematuria.  Musculoskeletal:  Positive for arthralgias, back pain and gait problem. Negative for joint swelling and neck pain.  Skin:  Negative for rash.  Neurological:  Negative for dizziness, tremors, speech difficulty and weakness.  Hematological:  Bruises/bleeds easily.  Psychiatric/Behavioral:  Positive for decreased concentration. Negative for agitation, confusion, dysphoric mood, sleep disturbance and  suicidal ideas. The patient is nervous/anxious.     Objective:  BP 118/78   Pulse 67   Temp 98 F (36.7 C) (Oral)   Ht 6' (1.829 m)   Wt 186 lb (84.4 kg)   SpO2 99%   BMI 25.23 kg/m   BP Readings from Last 3 Encounters:  06/25/24 118/78  05/23/24 116/80  04/23/24 120/64    Wt Readings from Last 3 Encounters:  06/25/24 186 lb (84.4 kg)  05/23/24 186 lb (84.4 kg)  04/23/24 176 lb (79.8 kg)    Physical Exam Constitutional:      General: He is not in acute distress.    Appearance: He is well-developed.     Comments: NAD  Eyes:     Conjunctiva/sclera: Conjunctivae normal.     Pupils: Pupils are equal, round, and reactive to light.  Neck:     Thyroid : No thyromegaly.     Vascular: No JVD.  Cardiovascular:     Rate and Rhythm: Normal rate and regular rhythm.     Heart sounds: Normal heart sounds. No murmur heard.    No friction rub. No gallop.  Pulmonary:     Effort: Pulmonary effort is normal. No respiratory distress.     Breath sounds: Normal breath sounds. No wheezing or rales.  Chest:     Chest wall: No tenderness.  Abdominal:     General: Bowel sounds are normal. There is no distension.     Palpations: Abdomen is soft. There is no mass.     Tenderness: There is no abdominal tenderness. There is no guarding or rebound.  Musculoskeletal:        General: Tenderness present. Normal range of motion.     Cervical back: Normal range of motion.     Right lower leg: Edema present.     Left lower leg: Edema present.  Lymphadenopathy:     Cervical: No cervical adenopathy.  Skin:    General: Skin is warm and dry.     Findings: Bruising and rash present.  Neurological:     Mental Status: He is alert and oriented to person, place, and time. Mental status is at baseline.     Cranial Nerves: No cranial nerve deficit.     Motor: No abnormal muscle tone.     Coordination: Coordination abnormal.     Gait: Gait abnormal.     Deep Tendon Reflexes: Reflexes are normal and  symmetric.  Psychiatric:        Behavior: Behavior normal.        Thought Content: Thought content normal.        Judgment: Judgment normal.   Legs w/trace edema Antalgic gait  Warts - hands   Procedure Note :     Procedure : Cryosurgery   Indication:  Wart(s)     Risks including unsuccessful procedure , bleeding, infection, bruising, scar, a  need for a repeat  procedure and others were explained to the patient in detail as well as the benefits. Informed consent was obtained verbally.   3  lesion(s)  on  face, hands  was/were treated with liquid nitrogen on a Q-tip in a usual fasion . Band-Aid was applied and antibiotic ointment was given for a later use.   Tolerated well. Complications none.   Postprocedure instructions :     Keep the wounds clean. You can wash them with liquid soap and water . Pat dry with gauze or a Kleenex tissue  Before applying antibiotic ointment and a Band-Aid.   You need to report immediately  if  any signs of infection develop.     Lab Results  Component Value Date   WBC 8.1 01/10/2024   HGB 14.5 01/10/2024   HCT 45.7 01/10/2024   PLT 170.0 01/10/2024   GLUCOSE 134 (H) 05/23/2024   CHOL 143 06/25/2022   TRIG 88 06/25/2022   HDL 42 06/25/2022   LDLCALC 83 06/25/2022   ALT 20 05/23/2024   AST 16 05/23/2024   NA 142 05/23/2024   K 4.8 05/23/2024   CL 100 05/23/2024   CREATININE 2.65 (H) 05/23/2024   BUN 57 (H) 05/23/2024   CO2 35 (H) 05/23/2024   TSH 1.80 01/10/2024   PSA 2.58 07/05/2022   INR 1.3 (H) 06/23/2022   HGBA1C 6.4 04/06/2023    No results found.  Assessment & Plan:   Problem List Items Addressed This Visit     Anxiety disorder - Primary   Cont on Lexapro       CKD (chronic kidney disease) stage 4, GFR 15-29 ml/min (HCC)   Hydrate well      Congestive heart failure (HCC)   Cont on Rx - Coreg , Torsemide       Diarrhea   Continue on Creon , gluten free diet      PMR (polymyalgia rheumatica) (HCC)   Feeling  better off Lipitor, on prednisone , off Flomax , on strict gluten free diet      Spinal stenosis of lumbar region   L sciatica: getting epidural inj x2 - Dr Laqueta Due in May On Tramadol  prn  Pain is >5-8/10: pt wants to try Prednisone ..SABRASABRAStart 10 mg/d  Potential benefits of a long term steroid  use as well as potential risks  and complications were explained to the patient and were aknowledged.      Warts   See procedure         Meds ordered this encounter  Medications   predniSONE  (DELTASONE ) 10 MG tablet    Sig: Take 1 tablet (10 mg total) by mouth daily with breakfast.    Dispense:  100 tablet    Refill:  1   gabapentin  (NEURONTIN ) 300 MG capsule    Sig: Take 1 capsule (300 mg total) by mouth 2 (two) times daily.    Dispense:  60 capsule    Refill:  5      Follow-up: No follow-ups on file.  Marolyn Noel, MD

## 2024-06-25 NOTE — Assessment & Plan Note (Signed)
 Cont on Rx - Coreg , Torsemide 

## 2024-06-25 NOTE — Assessment & Plan Note (Signed)
 See procedure

## 2024-06-25 NOTE — Assessment & Plan Note (Signed)
 Cont on Lexapro

## 2024-06-26 DIAGNOSIS — R600 Localized edema: Secondary | ICD-10-CM | POA: Diagnosis not present

## 2024-06-27 LAB — BASIC METABOLIC PANEL WITH GFR
BUN/Creatinine Ratio: 22 (ref 10–24)
BUN: 58 mg/dL — ABNORMAL HIGH (ref 8–27)
CO2: 29 mmol/L (ref 20–29)
Calcium: 9.7 mg/dL (ref 8.6–10.2)
Chloride: 99 mmol/L (ref 96–106)
Creatinine, Ser: 2.66 mg/dL — ABNORMAL HIGH (ref 0.76–1.27)
Glucose: 81 mg/dL (ref 70–99)
Potassium: 5.6 mmol/L — ABNORMAL HIGH (ref 3.5–5.2)
Sodium: 145 mmol/L — ABNORMAL HIGH (ref 134–144)
eGFR: 23 mL/min/1.73 — ABNORMAL LOW (ref >59)

## 2024-06-28 ENCOUNTER — Encounter: Payer: Self-pay | Admitting: Pharmacist

## 2024-06-28 NOTE — Progress Notes (Signed)
 Error

## 2024-07-02 ENCOUNTER — Ambulatory Visit: Payer: Self-pay | Admitting: Cardiovascular Disease

## 2024-07-02 DIAGNOSIS — E875 Hyperkalemia: Secondary | ICD-10-CM

## 2024-07-03 ENCOUNTER — Other Ambulatory Visit: Payer: Self-pay

## 2024-07-03 NOTE — Telephone Encounter (Signed)
 Caller [place]) returned RN's call.

## 2024-07-03 NOTE — Telephone Encounter (Signed)
 Patient returned RN's call.

## 2024-07-04 ENCOUNTER — Telehealth: Payer: Self-pay | Admitting: Podiatry

## 2024-07-04 DIAGNOSIS — E875 Hyperkalemia: Secondary | ICD-10-CM | POA: Diagnosis not present

## 2024-07-04 LAB — BASIC METABOLIC PANEL WITH GFR
BUN/Creatinine Ratio: 20 (ref 10–24)
BUN: 58 mg/dL — ABNORMAL HIGH (ref 8–27)
CO2: 29 mmol/L (ref 20–29)
Calcium: 9.4 mg/dL (ref 8.6–10.2)
Chloride: 99 mmol/L (ref 96–106)
Creatinine, Ser: 2.83 mg/dL — ABNORMAL HIGH (ref 0.76–1.27)
Glucose: 109 mg/dL — ABNORMAL HIGH (ref 70–99)
Potassium: 5 mmol/L (ref 3.5–5.2)
Sodium: 145 mmol/L — ABNORMAL HIGH (ref 134–144)
eGFR: 22 mL/min/1.73 — ABNORMAL LOW (ref >59)

## 2024-07-04 NOTE — Telephone Encounter (Signed)
 Called patient to reschedule to later date or move to A. Regals schedule.

## 2024-07-08 ENCOUNTER — Telehealth: Payer: Self-pay | Admitting: Cardiovascular Disease

## 2024-07-08 NOTE — Telephone Encounter (Signed)
 Pt c/o swelling/edema: STAT if pt has developed SOB within 24 hours  If swelling, where is the swelling located? Legs and feet   How much weight have you gained and in what time span? 3 pound since Friday   Have you gained 2 pounds in a day or 5 pounds in a week?NA    Do you have a log of your daily weights (if so, list)? NA    Are you currently taking a fluid pill? Yes   Are you currently SOB? No   Have you traveled recently in a car or plane for an extended period of time? No   Kiki called from [facility] called in to report the swelling / Best number [phone]

## 2024-07-08 NOTE — Telephone Encounter (Signed)
 Spoke to [employer] RN with [facility] calling to report patient has gained 3 lbs since Fri 9/5.He has swelling in both lower legs and feet.Advised ok to double Lasix  dose for the next 3 days only then return to normal dose.Follow up appointment scheduled with Lum Louis NP 9/16 at 2:45 pm.I will make Dr.Cooper aware.

## 2024-07-09 ENCOUNTER — Ambulatory Visit (INDEPENDENT_AMBULATORY_CARE_PROVIDER_SITE_OTHER): Admitting: Podiatry

## 2024-07-09 ENCOUNTER — Ambulatory Visit (INDEPENDENT_AMBULATORY_CARE_PROVIDER_SITE_OTHER)

## 2024-07-09 ENCOUNTER — Encounter: Payer: Self-pay | Admitting: Podiatry

## 2024-07-09 DIAGNOSIS — M79674 Pain in right toe(s): Secondary | ICD-10-CM

## 2024-07-09 DIAGNOSIS — M7989 Other specified soft tissue disorders: Secondary | ICD-10-CM

## 2024-07-09 DIAGNOSIS — M79675 Pain in left toe(s): Secondary | ICD-10-CM

## 2024-07-09 DIAGNOSIS — L03032 Cellulitis of left toe: Secondary | ICD-10-CM | POA: Diagnosis not present

## 2024-07-09 DIAGNOSIS — B351 Tinea unguium: Secondary | ICD-10-CM

## 2024-07-09 MED ORDER — CLINDAMYCIN HCL 300 MG PO CAPS: 300.0000 mg | ORAL_CAPSULE | Freq: Three times a day (TID) | ORAL | 0 refills | Status: DC

## 2024-07-09 NOTE — Progress Notes (Signed)
 Subjective: Chief Complaint  Patient presents with   Toe Pain    Rm13 Left 3rd toe red and swollen for 2 weeks/no hx gout/ no treatment   83 year old male presents the office with concerns of left third toe swelling, redness.  Thinks is getting somewhat better over the last week.  Does not recall any injuries.  No recent treatment.  No history of gout.  No open lesions that he notes.  The wounds on the heel have resolved.  Nails are also thickened elongated and they cause discomfort.  Objective: AAO x3, NAD DP/PT pulses palpable bilaterally, CRT less than 3 seconds On the left third toe there is localized edema and erythema present but there is no ascending cellulitis.  There were no open lesions.  Identified this time.  No other areas of edema, erythema. Nails are hypertrophic, dystrophic, brittle, discolored, elongated 10. No surrounding redness or drainage. Tenderness nails 1-5 bilaterally. No open lesions or pre-ulcerative lesions are identified today. No pain with calf compression, swelling, warmth, erythema  Assessment: Left second toe erythema; symptomatic onychomycosis  Plan: Left third toe erythema -We discussed infection versus gout.  I do think it could be gout given his symptoms are improving down.  I will start with antibiotics and prescribed clindamycin  as he did well with this previously.  I offered a surgical shoe but he declined as he states he is feeling better.  On hold off on anti-inflammatories given kidney function.  Symptomatic onychomycosis -Sharply debrided nails x 10 without complications or bleeding.  Radiology: 3 views left foot were obtained.  No evidence of acute fracture noted.  Vessel calcification present.  Return in about 3 weeks (around 07/30/2024), or if symptoms worsen or fail to improve, for toe infection.  Scott Oneal DPM

## 2024-07-09 NOTE — Telephone Encounter (Signed)
 Agree. thanks

## 2024-07-15 ENCOUNTER — Telehealth: Payer: Self-pay | Admitting: Radiology

## 2024-07-15 ENCOUNTER — Telehealth: Payer: Self-pay | Admitting: Cardiovascular Disease

## 2024-07-15 NOTE — Telephone Encounter (Signed)
  Kia from Home Health is calling to report swelling and weight gain  Pt c/o swelling/edema: STAT if pt has developed SOB within 24 hours  If swelling, where is the swelling located? Both legs and feet, fluid filled blisters on left leg  How much weight have you gained and in what time span? 188 on Thursday and today he is 196 = 8 pounds Have you gained 2 pounds in a day or 5 pounds in a week? yes  Do you have a log of your daily weights (if so, list)?   Are you currently taking a fluid pill? yes  Are you currently SOB? no  Have you traveled recently in a car or plane for an extended period of time? no

## 2024-07-15 NOTE — Telephone Encounter (Signed)
 Copied from CRM [id number]. Topic: Clinical - Home Health Verbal Orders >> Jul 15, 2024 12:18 PM Chiquita SQUIBB wrote: Caller/Agency: Kia from [facility]  Callback Number: [phone] Service Requested: Skilled Nursing Frequency: 1 week 1, 2 week 2 Any new concerns about the patient? Yes- Kia has contacted cardiology regarding the concerns regarding swelling.

## 2024-07-15 NOTE — Telephone Encounter (Signed)
 Returned call to Kia at [facility].   Kia states she wanted to update our office prior to patient's appt tomorrow 07/16/24 with Lum Louis, NP. Kia states patient has had worsening leg swelling despite 2 courses of increased diuretics.   Lasix  was first increased 8/8 for 14 days. Then on 07/08/24, lasix  was increased for 3 days due to leg swelling. Kia estimates patient's last dose of increased lasix  was 9/11 or 9/12. She states patient's leg edema is unimproved. She notes intact blisters on legs with no weeping. She states patient does not have SOB at this time. Pt's creatinine was elevated 11 days ago on 07/04/24 at 2.83, advised Kia that patient should keep appt tomorrow with NP. Kia verbalizes understanding, also agrees patient would need to go to ED if SOB develops.

## 2024-07-16 ENCOUNTER — Encounter: Payer: Self-pay | Admitting: Emergency Medicine

## 2024-07-16 ENCOUNTER — Ambulatory Visit: Attending: Emergency Medicine | Admitting: Emergency Medicine

## 2024-07-16 VITALS — BP 112/72 | HR 71 | Ht 72.0 in | Wt 196.0 lb

## 2024-07-16 DIAGNOSIS — I10 Essential (primary) hypertension: Secondary | ICD-10-CM

## 2024-07-16 DIAGNOSIS — R609 Edema, unspecified: Secondary | ICD-10-CM

## 2024-07-16 DIAGNOSIS — I5032 Chronic diastolic (congestive) heart failure: Secondary | ICD-10-CM | POA: Diagnosis not present

## 2024-07-16 DIAGNOSIS — I2510 Atherosclerotic heart disease of native coronary artery without angina pectoris: Secondary | ICD-10-CM | POA: Diagnosis not present

## 2024-07-16 DIAGNOSIS — E785 Hyperlipidemia, unspecified: Secondary | ICD-10-CM

## 2024-07-16 DIAGNOSIS — I491 Atrial premature depolarization: Secondary | ICD-10-CM | POA: Diagnosis not present

## 2024-07-16 DIAGNOSIS — N184 Chronic kidney disease, stage 4 (severe): Secondary | ICD-10-CM | POA: Diagnosis not present

## 2024-07-16 DIAGNOSIS — I82512 Chronic embolism and thrombosis of left femoral vein: Secondary | ICD-10-CM

## 2024-07-16 DIAGNOSIS — I471 Supraventricular tachycardia, unspecified: Secondary | ICD-10-CM

## 2024-07-16 MED ORDER — FUROSEMIDE 20 MG PO TABS: 20.0000 mg | ORAL_TABLET | Freq: Every day | ORAL | 1 refills | Status: DC

## 2024-07-16 NOTE — Progress Notes (Signed)
 Cardiology Office Note:    Date:  07/16/2024  ID:  Scott Oneal, DOB November 07, 1940, MRN [id number] PCP: Garald Karlynn GAILS, MD  [facility] Providers Cardiologist:  Ozell Fell, MD       Patient Profile:       Chief Complaint: Acute visit for lower extremity swelling History of Present Illness:  Scott Oneal is a 83 y.o. male with visit-pertinent history of:  Coronary artery disease  S/p inferior MI in 2005 >> PCI: overlapping (33 x 30 mm, 8 x 30 mm) DES to RCA  Staged PCI: 2.5 x 8 mm DES to LCx Myoview  01/21/20: EF 47, no ischemia, intermediate risk (low EF)  Cath 01/31/20: LCx and RCA stents patent w mod (50) ISR, mod mid (65) and dist LAD (75) dz >> med rx HFmrEF (heart failure with mildly reduced ejection fraction)   TTE 01/28/2020: EF 45-50, global HK, mild LVH, Gr 2 DD, GLS -16%, normal RVSF, RVSP 40.3, trivial MR, AoV sclerosis (no AS), mild dilation of Asc Aorta (37 mm)  TTE 03/19/2021: EF 50-55 TTE 07/20/2023: EF 55-60, no RWMA, mild LVH, GR 1 DD, normal RVSF, mildly elevated PASP (RVSP 36.1), moderate LAE, trivial MR, AV sclerosis, RAP 8 Chronic kidney disease (eGFR 33) Carotid US  06/01/23: Bilateral ICA 1-39 ABIs 08/2016: Normal bilaterally Hx of DVT 05/2022 s/p mechanical thrombectomy of left common iliac vein, left external iliac vein, left common femoral vein, left femoral vein  Hypertension  Hyperlipidemia  Supraventricular tachycardia ZIO 12/13/2023 NSR, rare PACs with 6 beat VT run, frequent SVT, frequent PACs 16% burden PACs  He was seen in office on 07/2023 at that time he was experiencing some hypotension with blood pressures as low as 98/52.  Carvedilol  was decreased from 25 mg down to 6.25 mg twice daily.  Isosorbide  was also stopped.  Follow-up echocardiogram demonstrated normal EF and mild pulmonary hypertension.  He had follow-up on 11/20/2023 in clinic.  He was found to be in SVT with a heart rate in the 220s.  He was sent to the emergency department and  spontaneously converted back to sinus rhythm with PACs.  He was reportedly asymptomatic during the entire episode.  He was last seen in office on 11/24/2023 by Dr. Fell.  He is doing well without any acute complaints.  His carvedilol  was increased to 6.25 mg twice daily to reduce his risk of recurrent SVT.  14-day ZIO was ordered showing minimum HR 61 bpm, max HR 218 bpm, and average HR 89 bpm.  Predominant underlying rhythm was sinus rhythm.  1 run of ventricular tachycardia occurred lasting 6 beats with a max rate of 182 bpm.  560 supraventricular tachycardia runs occurred, the run with the fastest interval lasting 4 beats with max rate of 218 bpm, the longest lasting 11.1 seconds with an average rate of 134 bpm.  SVT was detected within 45 seconds of symptomatic patient events.  16.4% PAC burden.  Rare PVCs.  No atrial fibs or flutter.  No sustained arrhythmias.  On 9/8 patient called into the nurse triage line reporting weight gain and lower extremity swelling.  Lasix  was first increased on 8/8 for 14 days then on 9/8 he was to double his Lasix  dose for the next 3 days and then return to his normal dose.  He was made for follow-up visit in office.   Discussed the use of AI scribe software for clinical note transcription with the patient, who gave verbal consent to proceed.  History of Present  Illness Scott Oneal is an 83 year old male who presents with persistent leg swelling.  He experiences persistent swelling in his legs, particularly the left, with significant swelling in the feet.  His leg swelling usually worsens throughout the day however does improve with elevation.  Reports his feet do appear normal when he wakes up in the morning however does progressively worsen throughout the day.  He does not currently wear lower extremity stockings.  He denies any leg pain or claudication.  He denies any chest pains, orthopnea, PND, palpitations.  He moved to [place] four and a half  months ago and has since gained weight, attributed to increased food intake, including sodium-rich foods.  Reports he now eats 3 fairly heavy meals daily.    Review of systems:  Please see the history of present illness. All other systems are reviewed and otherwise negative.      Studies Reviewed:    EKG Interpretation Date/Time:  Tuesday July 16 2024 15:01:56 EDT Ventricular Rate:  83 PR Interval:  94 QRS Duration:  80 QT Interval:  360 QTC Calculation: 423 R Axis:   -13  Text Interpretation: Sinus rhythm with short PR with Premature atrial complexes Minimal voltage criteria for LVH, may be normal variant ( R in aVL ) Inferior infarct (cited on or before 24-Nov-2023) When compared with ECG of 24-Nov-2023 14:32, Premature atrial complexes are now Present Confirmed by Rana Dixon [phone]) on 07/16/2024 5:17:23 PM    Lower extremity venous ultrasound 01/03/2024 RIGHT:  - No evidence of common femoral vein obstruction.     LEFT:   - Findings consistent with chronic deep vein thrombosis involving the left  femoral vein, left popliteal vein, left posterior tibial veins, and left  peroneal veins.    Essentially unchanged since last exam 11/08/2023   Zio 11/24/2023 Patch Wear Time:  13 days and 0 hours (2025-01-24T15:06:27-0500 to 2025-02-06T15:22:50-0500)   Patient had a min HR of 61 bpm, max HR of 218 bpm, and avg HR of 89 bpm. Predominant underlying rhythm was Sinus Rhythm. 1 run of Ventricular Tachycardia occurred lasting 6 beats with a max rate of 182 bpm (avg 158 bpm). 560 Supraventricular Tachycardia  runs occurred, the run with the fastest interval lasting 4 beats with a max rate of 218 bpm, the longest lasting 11.1 secs with an avg rate of 134 bpm. Supraventricular Tachycardia was detected within +/- 45 seconds of symptomatic patient event(s).  Isolated SVEs were frequent (16.4%, [id number]), SVE Couplets were occasional (3.8%, 32775), and SVE Triplets were occasional (1.7%,  9921). Isolated VEs were rare (<1.0%, 8894), VE Couplets were rare (<1.0%, 261), and VE Triplets were rare (<1.0%, 1).  Ventricular Bigeminy and Trigeminy were present.    SUMMARY: Normal sinus rhythm with an average HR of 89 bpm. Rare PVC's with a 6 beat ventricular run. Frequent supraventricular beats of 16% with frequent short supraventricular runs. No atrial fib or flutter. No sustained arrhythmia.   Echocardiogram 07/20/2023  1. Left ventricular ejection fraction, by estimation, is 55 to 60%. The  left ventricle has normal function. The left ventricle has no regional  wall motion abnormalities. There is mild left ventricular hypertrophy.  Left ventricular diastolic parameters  are consistent with Grade I diastolic dysfunction (impaired relaxation).   2. Right ventricular systolic function is normal. The right ventricular  size is normal. There is mildly elevated pulmonary artery systolic  pressure. The estimated right ventricular systolic pressure is 36.1 mmHg.   3. Left atrial size  was moderately dilated.   4. The mitral valve is degenerative. Trivial mitral valve regurgitation.  Moderate mitral annular calcification.   5. Aortic valve regurgitation is not visualized. Aortic valve  sclerosis/calcification is present, without any evidence of aortic  stenosis.   6. The inferior vena cava is dilated in size with >50% respiratory  variability, suggesting right atrial pressure of 8 mmHg.   Cardiac catheterization 01/31/2020 Widely patent left main LAD is relatively small but does reach the left ventricular apex.  Moderate diffuse disease is noted proximally, there is eccentric 50% mid narrowing before a large septal perforator after an acute bend in the LAD before the origin of the last diagonal there is eccentric 75% stenosis in the LAD.  Distal LAD is less than 2 mm in diameter. The circumflex system is codominant with RCA.  The circumflex gives origin to have obtuse marginal branches.  The  third obtuse marginal contains ostial 60% narrowing.  The proximal circumflex beyond a 90 degree bend contains a previously placed stent that has diffuse in-stent restenosis up to 50%.  No high-grade obstruction is noted. RCA is a codominant vessel is overlapping Cypher stent from proximal to mid.  The entire stented segment contains diffuse in-stent restenosis up to 50%.  No high-grade focal stenosis is seen.  The nonstented RCA contains moderate diffuse disease. The EDP is 19 mmHg.  Left ventriculography was not performed because of kidney impairment.   RECOMMENDATIONS:   No focal high-grade stenosis.  Stents are patent. Current symptoms are probably not ischemia related. Further management as per the treating team. Diagnostic Dominance: Co-dominant  Risk Assessment/Calculations:              Physical Exam:   VS:  BP 112/72   Pulse 71   Ht 6' (1.829 m)   Wt 196 lb (88.9 kg)   SpO2 97%   BMI 26.58 kg/m    Wt Readings from Last 3 Encounters:  07/16/24 196 lb (88.9 kg)  06/25/24 186 lb (84.4 kg)  05/23/24 186 lb (84.4 kg)    GEN: Well nourished, well developed in no acute distress NECK: No JVD; No carotid bruits CARDIAC: RRR, no murmurs, rubs, gallops RESPIRATORY:  Clear to auscultation without rales, wheezing or rhonchi  ABDOMEN: Soft, non-tender, non-distended EXTREMITIES: 1+ bilateral LEE (L>R); No acute deformity      Assessment and Plan:  Leg swelling Chronic DVT S/p mechanical thrombectomy of left iliac venous system due to extensive DVT in 2023.  Followed by vascular surgery.  His Eliquis  was discontinued on 11/2023.  It was discussed with patient by vascular surgery that he would have some level of edema in his left lower extremity indefinitely - Most recent left lower extremity US  12/2023 showed findings consistent with chronic DVT involving the left femoral vein, left popliteal vein, left posterior tibial veins and left peroneal veins.  Essentially unchanged since  previous exams on 11/2023 and 05/2023 - He reports ongoing lower extremity swelling L>R that has been fairly persistent over the past several months.  Swelling progressively worsens throughout the day however improves with elevation and is reported as normal when he wakes up in the mornings.  He does not have any visible ulcerations, leg pain, or claudication.  - He has recently trialed increased dose of Lasix  from 20 to 40 mg daily for 2-3 without any improvement - He is not managing his symptoms with compression stockings - He also reports recently moving to [facility] assisted living just 4 months ago  and is now eating 3 large possibly high sodium meals daily cooked by the facility which is new for him.  He has noted weight gain with this - He denies any dyspnea, chest pains, PND.  Not thought to be diastolic heart failure exacerbation - Plan to continue his Lasix  20 mg daily and follow-up with vascular surgery - Encouraged compression and elevation of his lower extremities  Coronary artery disease History of inferior MI in 2005 treated with overlapping DES to RCA and staged PCI with DES to the Lcx Last catheterization in 2021 with patent stents and moderate LAD disease - Today he is without anginal symptoms.  Remains fairly active without exertional chest pains.  No indication further ischemic evaluation or antianginal therapy at this time - Experienced some lower extremity weakness on 12/2023.  His Lipitor was subsequently held at that time with improvement in symptoms.  Does not seem he was restarted on any other statin therapy by his PCP.  He is not interested in further statin lowering therapy at this time.  He will follow-up with PCP for further discussion - Continue aspirin  81 mg daily, carvedilol  6.25 mg twice daily  HFpEF Echocardiogram 07/2023 with LVEF 55 to 60%, no RWMA, mild LVH, grade 1 DD, RV normal - Today he appears euvolemic on exam.  Has chronic LEE as noted above.  He denies  dyspnea, orthopnea, PND - Has had some weight gain since moving to [facility] assisted [facility] due to eating 3 fairly large high sodium meals daily - Education given on low-sodium dieting - Continue carvedilol  6.25 mg twice daily, furosemide  20 mg daily  Chronic kidney disease Followed by nephrology (Dr. Prescilla)  Most recent creatinine 2.83 and GFR 22 on 07/04/2024 - Avoid nephrotoxic drugs  Essential hypertension Blood pressure today well-controlled at 112/72 - Continue current antihypertensive regimen  Hyperlipidemia His last LDL on file was 83 on 05/2022 As noted above his atorvastatin  was subsequently held on 12/2023 in the setting of lower extremity weakness per PCP.  He is not interested in further cholesterol-lowering therapy at this time.  He will further discuss at his upcoming PCP visit  Paroxysmal atrial tachycardia PACs Zio 11/2023 with 560 supraventricular tachycardia runs, the run with the fastest interval lasting 4 beats with a max rate of 218 bpm, the longest lasting 11.1 seconds with average rate of 134 bpm.  16.4% PAC burden - He denies any palpitations or any episodes of elevated heart rates - Well-controlled.  Continue carvedilol  6.25 mg twice daily      Dispo:  Return in about 6 months (around 01/13/2025).  Signed, Lum LITTIE Louis, NP

## 2024-07-16 NOTE — Patient Instructions (Addendum)
 Medication Instructions:  NO CHANGES  Lab Work: BMET TO BE DONE TODAY.  Testing/Procedures: NONE  Follow-Up: At [facility], you and your health needs are our priority.  As part of our continuing mission to provide you with exceptional heart care, our providers are all part of one team.  This team includes your primary Cardiologist (physician) and Advanced Practice Providers or APPs (Physician Assistants and Nurse Practitioners) who all work together to provide you with the care you need, when you need it.  Your next appointment:   6 MONTHS  Provider:   Ozell Fell, MD OR Lum Louis, NP     PLEASE BE SURE TO FOLLOW UP WITH YOUR VASCULAR SURGEON.

## 2024-07-17 ENCOUNTER — Ambulatory Visit: Payer: Self-pay | Admitting: Emergency Medicine

## 2024-07-17 ENCOUNTER — Telehealth: Payer: Self-pay

## 2024-07-17 LAB — BASIC METABOLIC PANEL WITH GFR
BUN/Creatinine Ratio: 22 (ref 10–24)
BUN: 53 mg/dL — ABNORMAL HIGH (ref 8–27)
CO2: 24 mmol/L (ref 20–29)
Calcium: 9 mg/dL (ref 8.6–10.2)
Chloride: 102 mmol/L (ref 96–106)
Creatinine, Ser: 2.45 mg/dL — ABNORMAL HIGH (ref 0.76–1.27)
Glucose: 123 mg/dL — ABNORMAL HIGH (ref 70–99)
Potassium: 5.1 mmol/L (ref 3.5–5.2)
Sodium: 144 mmol/L (ref 134–144)
eGFR: 26 mL/min/1.73 — ABNORMAL LOW (ref >59)

## 2024-07-17 NOTE — Telephone Encounter (Signed)
 Pt called reporting bilateral ankle swelling, L>R. Pt reported no wounds, previous wounds from 3/25 are healed.  Pt knows mild leg swelling is to be expected with chronic DVT. Pt knows to elevate ankles above the heart and wear his compression stockings. Advised patient to follow up with his podiatrist and PCP. Pt knows to call back if wounds develop.

## 2024-07-18 ENCOUNTER — Telehealth: Payer: Self-pay | Admitting: Radiology

## 2024-07-18 NOTE — Telephone Encounter (Signed)
 Copied from CRM [id number]. Topic: Clinical - Home Health Verbal Orders >> Jul 18, 2024 12:23 PM Drema MATSU wrote: Patient update: Patient has a 9 pound weight gain over his baseline. Cardiology(appt on Tuesday)  was informed but made no changes were mad. Patient is having cough, wheezing, BLE 2+ ademia, and he has a new rash on both feet and behind the ears. She states it may be an allergic reaction to the antibiotic prescribed or cellulitis. Antibiotics finished on yesterday but she would like an order for Benadryl  to clear up some of the allergic reaction he has going on.

## 2024-07-18 NOTE — Telephone Encounter (Signed)
 Verbal orders given to Kia.

## 2024-07-18 NOTE — Telephone Encounter (Signed)
 Copied from CRM [id number]. Topic: Clinical - Home Health Verbal Orders >> Jul 15, 2024 12:18 PM Chiquita SQUIBB wrote: Caller/Agency: Kia from [facility]  Callback Number: [phone] Service Requested: Skilled Nursing Frequency: 1 week 1, 2 week 2 Any new concerns about the patient? Yes- Kia has contacted cardiology regarding the concerns regarding swelling. >> Jul 18, 2024 12:23 PM Drema MATSU wrote: Lita is calling with an update . She stated that patient has a lot of concerns and she doesn't want him to have delayed care.

## 2024-07-18 NOTE — Telephone Encounter (Signed)
Forwarded to Dr. Posey Rea

## 2024-07-19 NOTE — Telephone Encounter (Signed)
 Please schedule office visit with any available provider.  Thank you

## 2024-07-22 ENCOUNTER — Ambulatory Visit: Admitting: Podiatry

## 2024-07-24 DIAGNOSIS — H353211 Exudative age-related macular degeneration, right eye, with active choroidal neovascularization: Secondary | ICD-10-CM | POA: Diagnosis not present

## 2024-07-29 ENCOUNTER — Telehealth: Payer: Self-pay

## 2024-07-29 NOTE — Telephone Encounter (Signed)
 Copied from CRM [id number]. Topic: Clinical - Medical Advice >> Jul 29, 2024 11:16 AM Anairis L wrote: Reason for CRM: Patient is calling in because he just seen his wound care nurse and she suggested his fluid pill to be raised to 40 mg instead of 10 mg.As well as it be replaced with Torsemide .   Thank you.      [id number] [place], [place] - [address] [place] [address] Phone: [phone] Fax: [phone] Hours: Not open 24 hours

## 2024-08-01 ENCOUNTER — Other Ambulatory Visit: Payer: Self-pay | Admitting: Internal Medicine

## 2024-08-05 NOTE — Telephone Encounter (Signed)
 He is on furosemide  20 mg a day.  Okay to raise to 40 mg a day.  Thanks.

## 2024-08-09 NOTE — Telephone Encounter (Signed)
 Appointment scheudle for 08/28/2024

## 2024-08-14 ENCOUNTER — Telehealth: Payer: Self-pay

## 2024-08-14 NOTE — Telephone Encounter (Signed)
 Copied from CRM [id number]. Topic: General - Other >> Aug 14, 2024  9:44 AM Carlyon D wrote: Reason for CRM: Pt son and Daughter in law are calling in regards to pt. Stating [facility] and [facility] the assisted living facility pharmacy. That correct pharmacy Son and Daughter do not know the Pharmacy name. They will call back to give the name of the pharmacy to use going forward.  Pt Son [place] Power of attorney Call back # [phone] Son [place] is asking for a call back at earliest convenience.  NO [facility]TS [facility].

## 2024-08-14 NOTE — Telephone Encounter (Signed)
 Copied from CRM #[id number]. Topic: Clinical - Prescription Issue >> Aug 13, 2024  2:52 PM Mia F wrote: Reason for CRM: Pt is in a [facility]  living and [facility] not [facility] and does not know what medications he is taking. Pt gave nurse a bottle for Torsemide  today and she was not aware he was taking it. She asks if rx refills can be sent to pharmacy and she does need an order for the Torsemide . [phone] [facility]   Fax [phone]

## 2024-08-16 ENCOUNTER — Other Ambulatory Visit: Payer: Self-pay

## 2024-08-16 NOTE — Telephone Encounter (Signed)
 Pts pharmacy has been updated. Attempted to call pts son to inform him that we have spoken to the facility and taken care of this issue.

## 2024-08-22 NOTE — Telephone Encounter (Signed)
 Copied from CRM [id number]. Topic: Clinical - Home Health Verbal Orders >> Aug 22, 2024 12:47 PM Thersia BROCKS wrote: Caller/Agency: Marko Rushing Number: [phone] Service Requested: Physical Therapy Frequency: 1 week 1 2 week2 1 week 2  Any new concerns about the patient? No

## 2024-08-22 NOTE — Telephone Encounter (Signed)
 MyChart message sent to patient.

## 2024-08-23 ENCOUNTER — Ambulatory Visit (INDEPENDENT_AMBULATORY_CARE_PROVIDER_SITE_OTHER): Admitting: Podiatry

## 2024-08-23 ENCOUNTER — Encounter: Payer: Self-pay | Admitting: Podiatry

## 2024-08-23 DIAGNOSIS — B351 Tinea unguium: Secondary | ICD-10-CM | POA: Diagnosis not present

## 2024-08-23 DIAGNOSIS — M79675 Pain in left toe(s): Secondary | ICD-10-CM

## 2024-08-23 DIAGNOSIS — M79674 Pain in right toe(s): Secondary | ICD-10-CM

## 2024-08-23 DIAGNOSIS — N184 Chronic kidney disease, stage 4 (severe): Secondary | ICD-10-CM

## 2024-08-23 NOTE — Progress Notes (Signed)
This patient returns to my office for at risk foot care.  This patient requires this care by a professional since this patient will be at risk due to having CKD.  This patient is unable to cut nails himself since the patient cannot reach his nails.These nails are painful walking and wearing shoes.  This patient presents for at risk foot care today.  General Appearance  Alert, conversant and in no acute stress.  Vascular  Dorsalis pedis and posterior tibial  pulses are palpable  bilaterally.  Capillary return is within normal limits  bilaterally. Temperature is within normal limits  bilaterally.  Neurologic  Senn-Weinstein monofilament wire test within normal limits  bilaterally. Muscle power within normal limits bilaterally.  Nails Thick disfigured discolored nails with subungual debris  from hallux to fifth toes bilaterally. No evidence of bacterial infection or drainage bilaterally.  Orthopedic  No limitations of motion  feet .  No crepitus or effusions noted.  No bony pathology or digital deformities noted.  Skin  normotropic skin with no porokeratosis noted bilaterally.  No signs of infections or ulcers noted.     Onychomycosis  Pain in right toes  Pain in left toes  Consent was obtained for treatment procedures.   Mechanical debridement of nails 1-5  bilaterally performed with a nail nipper.  Filed with dremel without incident.    Return office visit   3 months                  Told patient to return for periodic foot care and evaluation due to potential at risk complications.   Chandy Tarman DPM   

## 2024-08-26 ENCOUNTER — Telehealth: Payer: Self-pay

## 2024-08-26 NOTE — Telephone Encounter (Signed)
 Copied from CRM #[id number]. Topic: Clinical - Home Health Verbal Orders >> Aug 26, 2024 12:17 PM Aleatha BROCKS wrote: Caller/Agency:Monique  Callback Number: [phone] Service Requested: Physical Therapy Frequency: 1 week 1 2 week2 1 week 2  Any new concerns about the patient? No   Marko has called several times regarding this order and hasn't heard anything, she wanted  the Dr to call the patient and let the patient know the reason for the hold up, as she has tried to get this order done for weeks now and patient is steady asking her why its not done

## 2024-08-27 NOTE — Telephone Encounter (Signed)
 I was able to speak with [facility] and give her the verbal ok for Service Requested: Physical Therapy Frequency: 1 week 1 2 week2 1 week 2

## 2024-08-27 NOTE — Telephone Encounter (Signed)
 Okay. Thank you.

## 2024-08-27 NOTE — Telephone Encounter (Signed)
 Okay.  Thanks.

## 2024-08-28 ENCOUNTER — Encounter: Payer: Self-pay | Admitting: Internal Medicine

## 2024-08-28 ENCOUNTER — Ambulatory Visit: Admitting: Internal Medicine

## 2024-08-28 ENCOUNTER — Telehealth: Payer: Self-pay

## 2024-08-28 DIAGNOSIS — I5042 Chronic combined systolic (congestive) and diastolic (congestive) heart failure: Secondary | ICD-10-CM | POA: Diagnosis not present

## 2024-08-28 DIAGNOSIS — N184 Chronic kidney disease, stage 4 (severe): Secondary | ICD-10-CM | POA: Diagnosis not present

## 2024-08-28 DIAGNOSIS — K900 Celiac disease: Secondary | ICD-10-CM | POA: Diagnosis not present

## 2024-08-28 DIAGNOSIS — H9313 Tinnitus, bilateral: Secondary | ICD-10-CM | POA: Diagnosis not present

## 2024-08-28 DIAGNOSIS — R609 Edema, unspecified: Secondary | ICD-10-CM

## 2024-08-28 DIAGNOSIS — M2550 Pain in unspecified joint: Secondary | ICD-10-CM

## 2024-08-28 DIAGNOSIS — H9390 Unspecified disorder of ear, unspecified ear: Secondary | ICD-10-CM

## 2024-08-28 MED ORDER — TORSEMIDE 20 MG PO TABS
40.0000 mg | ORAL_TABLET | Freq: Every day | ORAL | 5 refills | Status: AC
Start: 2024-08-28 — End: ?

## 2024-08-28 MED ORDER — TADALAFIL 5 MG PO TABS: 5.0000 mg | ORAL_TABLET | Freq: Every day | ORAL | 5 refills | Status: AC

## 2024-08-28 NOTE — Assessment & Plan Note (Signed)
 Hydrate well

## 2024-08-28 NOTE — Assessment & Plan Note (Signed)
ENT ref 

## 2024-08-28 NOTE — Assessment & Plan Note (Signed)
Resume strict gluten-free diet

## 2024-08-28 NOTE — Assessment & Plan Note (Signed)
 R helix lesion - ?skin cancer ENT ref

## 2024-08-28 NOTE — Progress Notes (Signed)
 Subjective:  Patient ID: Scott Oneal, male    DOB: [birth year]  Age: 83 y.o. MRN: [id number]  CC: No chief complaint on file.   HPI CAYSON KALB presents for HTN, CHF - worse, CAD, leg edema C/o ED, BPH  Outpatient Medications Prior to Visit  Medication Sig Dispense Refill   acetaminophen  (TYLENOL ) 650 MG CR tablet Take 650 mg by mouth in the morning.     aspirin  EC 81 MG tablet Take 1 tablet (81 mg total) by mouth daily. Swallow whole.     calcitRIOL  (ROCALTROL ) 0.25 MCG capsule TAKE 1 CAPSULE (0.25 MCG TOTAL) BY MOUTH IN THE MORNING. 90 capsule 1   carvedilol  (COREG ) 6.25 MG tablet Take 1 tablet (6.25 mg total) by mouth in the morning and at bedtime. 180 tablet 3   cephALEXin  (KEFLEX ) 500 MG capsule Take 500 mg by mouth 3 (three) times daily.     Cholecalciferol  (VITAMIN D3) 50 MCG (2000 UT) TABS Take 2,000 Units by mouth daily.     clindamycin  (CLEOCIN ) 300 MG capsule Take 1 capsule (300 mg total) by mouth 3 (three) times daily. 21 capsule 0   clotrimazole -betamethasone  (LOTRISONE ) cream APPLY 1 APPLICATION TOPICALLY DAILY 30 g 0   collagenase  (SANTYL ) 250 UNIT/GM ointment Apply 1 Application topically daily. 30 g 0   gabapentin  (NEURONTIN ) 300 MG capsule Take 1 capsule (300 mg total) by mouth 2 (two) times daily. 60 capsule 5   levothyroxine  (SYNTHROID ) 175 MCG tablet Take 1 tablet (175 mcg total) by mouth daily. 90 tablet 3   lipase/protease/amylase (CREON ) 36000 UNITS CPEP capsule Take 1-2 capsules (36,000-72,000 Units total) by mouth 3 (three) times daily before meals. (Patient taking differently: Take 72,000 Units by mouth 3 (three) times daily before meals. Per patient taking 2 tablets three times a day) 300 capsule 5   Multiple Vitamins-Minerals (PRESERVISION AREDS 2) CAPS Take 1 capsule by mouth in the morning and at bedtime.     mupirocin  ointment (BACTROBAN ) 2 % On leg wound w/dressing change qd or bid 30 g 0   pantoprazole  (PROTONIX ) 40 MG tablet TAKE 1 TABLET BY MOUTH  TWICE A DAY 180 tablet 3   predniSONE  (DELTASONE ) 10 MG tablet Take 1 tablet (10 mg total) by mouth daily with breakfast. 100 tablet 1   Propylene Glycol (SYSTANE BALANCE) 0.6 % SOLN Place 1 drop into both eyes 3 (three) times daily as needed (for dryness).     Syringe/Needle, Disp, (SYRINGE 3CC/23GX1) 23G X 1 3 ML MISC Use for IM inj q 2 wks 50 each 3   testosterone  cypionate (DEPOTESTOSTERONE CYPIONATE) 200 MG/ML injection INJECT INTO MUSCLE EVERY 2 WEEKS 4 mL 5   traMADol  (ULTRAM ) 50 MG tablet Take 1 tablet (50 mg total) by mouth every 6 (six) hours as needed for severe pain (pain score 7-10). 120 tablet 3   triamcinolone  cream (KENALOG ) 0.5 % Apply 1 Application topically 3 (three) times daily. 45 g 1   furosemide  (LASIX ) 20 MG tablet Take 1 tablet (20 mg total) by mouth daily. 90 tablet 1   nitroGLYCERIN  (NITROSTAT ) 0.4 MG SL tablet Place 0.4 mg under the tongue every 5 (five) minutes as needed for chest pain.     torsemide  (DEMADEX ) 20 MG tablet Take 20 mg by mouth daily.     No facility-administered medications prior to visit.    ROS: Review of Systems  Constitutional:  Positive for fatigue. Negative for appetite change and unexpected weight change.  HENT:  Positive for  tinnitus. Negative for congestion, nosebleeds, sneezing, sore throat and trouble swallowing.   Eyes:  Negative for itching and visual disturbance.  Respiratory:  Positive for wheezing. Negative for cough.   Cardiovascular:  Positive for leg swelling. Negative for chest pain and palpitations.  Gastrointestinal:  Negative for abdominal distention, blood in stool, diarrhea and nausea.  Genitourinary:  Positive for difficulty urinating. Negative for frequency and hematuria.  Musculoskeletal:  Positive for arthralgias, back pain and gait problem. Negative for joint swelling and neck pain.  Skin:  Positive for color change. Negative for rash.  Neurological:  Negative for dizziness, tremors, speech difficulty and  weakness.  Hematological:  Bruises/bleeds easily.  Psychiatric/Behavioral:  Positive for decreased concentration and dysphoric mood. Negative for agitation, sleep disturbance and suicidal ideas. The patient is not nervous/anxious.     Objective:  There were no vitals taken for this visit.  BP Readings from Last 3 Encounters:  07/16/24 112/72  06/25/24 118/78  05/23/24 116/80    Wt Readings from Last 3 Encounters:  07/16/24 196 lb (88.9 kg)  06/25/24 186 lb (84.4 kg)  05/23/24 186 lb (84.4 kg)    Physical Exam Constitutional:      General: He is not in acute distress.    Appearance: He is well-developed. He is obese.     Comments: NAD  Eyes:     Conjunctiva/sclera: Conjunctivae normal.     Pupils: Pupils are equal, round, and reactive to light.  Neck:     Thyroid : No thyromegaly.     Vascular: No JVD.  Cardiovascular:     Rate and Rhythm: Normal rate and regular rhythm.     Heart sounds: Normal heart sounds. No murmur heard.    No friction rub. No gallop.  Pulmonary:     Effort: Pulmonary effort is normal. No respiratory distress.     Breath sounds: Normal breath sounds. No wheezing or rales.  Chest:     Chest wall: No tenderness.  Abdominal:     General: Bowel sounds are normal. There is no distension.     Palpations: Abdomen is soft. There is no mass.     Tenderness: There is no abdominal tenderness. There is no guarding or rebound.  Musculoskeletal:        General: No tenderness. Normal range of motion.     Cervical back: Normal range of motion.     Right lower leg: Edema present.     Left lower leg: Edema present.  Lymphadenopathy:     Cervical: No cervical adenopathy.  Skin:    General: Skin is warm and dry.     Findings: Bruising present. No rash.  Neurological:     Mental Status: He is alert. Mental status is at baseline.     Cranial Nerves: No cranial nerve deficit.     Motor: Weakness present. No abnormal muscle tone.     Coordination: Coordination  abnormal.     Gait: Gait abnormal.     Deep Tendon Reflexes: Reflexes are normal and symmetric.  Psychiatric:        Behavior: Behavior normal.        Thought Content: Thought content normal.        Judgment: Judgment normal.   B trace to 1+ edema (better), L shin sore Arthritic gait R ear helix lesion  Lab Results  Component Value Date   WBC 8.1 01/10/2024   HGB 14.5 01/10/2024   HCT 45.7 01/10/2024   PLT 170.0 01/10/2024   GLUCOSE 123 (  H) 07/16/2024   CHOL 143 06/25/2022   TRIG 88 06/25/2022   HDL 42 06/25/2022   LDLCALC 83 06/25/2022   ALT 20 05/23/2024   AST 16 05/23/2024   NA 144 07/16/2024   K 5.1 07/16/2024   CL 102 07/16/2024   CREATININE 2.45 (H) 07/16/2024   BUN 53 (H) 07/16/2024   CO2 24 07/16/2024   TSH 1.80 01/10/2024   PSA 2.58 07/05/2022   INR 1.3 (H) 06/23/2022   HGBA1C 6.4 04/06/2023    No results found.  Assessment & Plan:   Problem List Items Addressed This Visit     Arthralgia   Chronic OA: back, knees      Celiac disease   Resume strict gluten free diet      CKD (chronic kidney disease) stage 4, GFR 15-29 ml/min (HCC)   Hydrate well      Congestive heart failure (HCC)   Worse Cont on Rx - Coreg , Torsemide  On Demadex  20 mg/d. Ok to take 40 mg to see if better      Relevant Medications   torsemide  (DEMADEX ) 20 MG tablet   tadalafil (CIALIS) 5 MG tablet   Ear lesion   R helix lesion - ?skin cancer ENT ref      Relevant Orders   Ambulatory referral to ENT   Edema - Primary   Use Calf Compression Sleeve On Demadex  20 mg/d. Ok to take 40 mg to see if better      Relevant Medications   torsemide  (DEMADEX ) 20 MG tablet   Tinnitus   ENT ref      Relevant Orders   Ambulatory referral to ENT      Meds ordered this encounter  Medications   torsemide  (DEMADEX ) 20 MG tablet    Sig: Take 2 tablets (40 mg total) by mouth daily.    Dispense:  60 tablet    Refill:  5   tadalafil (CIALIS) 5 MG tablet    Sig: Take 1  tablet (5 mg total) by mouth daily.    Dispense:  30 tablet    Refill:  5      Follow-up: Return in about 2 months (around 10/28/2024) for a follow-up visit.  Marolyn Noel, MD

## 2024-08-28 NOTE — Assessment & Plan Note (Addendum)
 Worse Cont on Rx - Coreg , Torsemide  On Demadex  20 mg/d. Ok to take 40 mg to see if better

## 2024-08-28 NOTE — Assessment & Plan Note (Signed)
 Use Calf Compression Sleeve On Demadex  20 mg/d. Ok to take 40 mg to see if better

## 2024-08-28 NOTE — Telephone Encounter (Signed)
 Copied from CRM #[id number]. Topic: Clinical - Medical Advice >> Aug 28, 2024  4:50 PM Nessti S wrote: Reason for CRM: bianca from [facility] wanted to make sure pt is supposed to take 20mg  of torsomide plus 20mg  of furosemide . Pharmacy asked what the cialis-tabalafil was for? Call back number [phone]

## 2024-08-28 NOTE — Assessment & Plan Note (Signed)
 Chronic OA: back, knees

## 2024-09-02 ENCOUNTER — Encounter: Payer: Self-pay | Admitting: Radiology

## 2024-09-02 NOTE — Telephone Encounter (Signed)
 No, his furosemide  was discontinued. She is supposed to take torsemide  20 mg 2 tablets daily Thanks

## 2024-09-03 DIAGNOSIS — M25561 Pain in right knee: Secondary | ICD-10-CM | POA: Diagnosis not present

## 2024-09-03 DIAGNOSIS — R262 Difficulty in walking, not elsewhere classified: Secondary | ICD-10-CM | POA: Diagnosis not present

## 2024-09-03 DIAGNOSIS — M25562 Pain in left knee: Secondary | ICD-10-CM | POA: Diagnosis not present

## 2024-09-03 DIAGNOSIS — M170 Bilateral primary osteoarthritis of knee: Secondary | ICD-10-CM | POA: Diagnosis not present

## 2024-09-03 NOTE — Telephone Encounter (Signed)
 Left a message for Renda as she was in a meeting... PCP has stated the following I regards to pt medications...  Cialis 5 mg daily for BPH Torsemide  -take 40 mg a day total dose Thanks

## 2024-09-03 NOTE — Telephone Encounter (Signed)
 Cialis 5 mg daily for BPH Torsemide  -take 40 mg a day total dose Thanks

## 2024-09-10 DIAGNOSIS — M170 Bilateral primary osteoarthritis of knee: Secondary | ICD-10-CM | POA: Diagnosis not present

## 2024-09-10 DIAGNOSIS — M25561 Pain in right knee: Secondary | ICD-10-CM | POA: Diagnosis not present

## 2024-09-10 DIAGNOSIS — R262 Difficulty in walking, not elsewhere classified: Secondary | ICD-10-CM | POA: Diagnosis not present

## 2024-09-10 DIAGNOSIS — M25562 Pain in left knee: Secondary | ICD-10-CM | POA: Diagnosis not present

## 2024-09-13 ENCOUNTER — Telehealth: Payer: Self-pay

## 2024-09-13 NOTE — Telephone Encounter (Signed)
 Copied from CRM #[id number]. Topic: Clinical - Home Health Verbal Orders >> Sep 13, 2024 12:23 PM Harlene ORN wrote: Caller/Agency: Marko GLENWOOD Sherrod Corean Ralph Callback Number: [phone] Service Requested: Physical Therapy Frequency: once a week for 6 weeks Any new concerns about the patient? No

## 2024-09-16 NOTE — Telephone Encounter (Signed)
 Okay.  Thanks.

## 2024-09-17 DIAGNOSIS — M170 Bilateral primary osteoarthritis of knee: Secondary | ICD-10-CM | POA: Diagnosis not present

## 2024-09-17 DIAGNOSIS — R262 Difficulty in walking, not elsewhere classified: Secondary | ICD-10-CM | POA: Diagnosis not present

## 2024-09-17 DIAGNOSIS — M25562 Pain in left knee: Secondary | ICD-10-CM | POA: Diagnosis not present

## 2024-09-17 DIAGNOSIS — M25561 Pain in right knee: Secondary | ICD-10-CM | POA: Diagnosis not present

## 2024-09-19 DIAGNOSIS — Z6841 Body Mass Index (BMI) 40.0 and over, adult: Secondary | ICD-10-CM | POA: Diagnosis not present

## 2024-09-19 DIAGNOSIS — I129 Hypertensive chronic kidney disease with stage 1 through stage 4 chronic kidney disease, or unspecified chronic kidney disease: Secondary | ICD-10-CM | POA: Diagnosis not present

## 2024-09-19 DIAGNOSIS — N184 Chronic kidney disease, stage 4 (severe): Secondary | ICD-10-CM | POA: Diagnosis not present

## 2024-09-19 DIAGNOSIS — R319 Hematuria, unspecified: Secondary | ICD-10-CM | POA: Diagnosis not present

## 2024-09-19 DIAGNOSIS — N320 Bladder-neck obstruction: Secondary | ICD-10-CM | POA: Diagnosis not present

## 2024-09-19 DIAGNOSIS — D649 Anemia, unspecified: Secondary | ICD-10-CM | POA: Diagnosis not present

## 2024-09-19 NOTE — Telephone Encounter (Signed)
 Called and spoke with [facility] and provided her with verbal OK.

## 2024-09-24 DIAGNOSIS — R262 Difficulty in walking, not elsewhere classified: Secondary | ICD-10-CM | POA: Diagnosis not present

## 2024-09-24 DIAGNOSIS — M25562 Pain in left knee: Secondary | ICD-10-CM | POA: Diagnosis not present

## 2024-09-24 DIAGNOSIS — M170 Bilateral primary osteoarthritis of knee: Secondary | ICD-10-CM | POA: Diagnosis not present

## 2024-09-24 DIAGNOSIS — M25561 Pain in right knee: Secondary | ICD-10-CM | POA: Diagnosis not present

## 2024-09-25 ENCOUNTER — Telehealth (HOSPITAL_COMMUNITY): Payer: Self-pay | Admitting: Pharmacy Technician

## 2024-09-25 ENCOUNTER — Other Ambulatory Visit (HOSPITAL_COMMUNITY): Payer: Self-pay | Admitting: Nephrology

## 2024-09-25 DIAGNOSIS — D631 Anemia in chronic kidney disease: Secondary | ICD-10-CM | POA: Insufficient documentation

## 2024-09-25 NOTE — Telephone Encounter (Signed)
 Auth Submission: NO AUTH NEEDED Site of care: [facility] Payer: [employer] Medication & CPT/J Code(s) submitted: Venofer (Iron Sucrose) J1756 Diagnosis Code: N18.9, D63.1 Route of submission (phone, fax, portal):  Phone # Fax # Auth type: Buy/Bill HB Units/visits requested: 300MG  X 3 DOSES Reference number:  Approval from: 09/25/2024 to 11/30/24    Dagoberto Armour, CPhT [facility] Phone: [phone] 09/25/2024

## 2024-10-11 DIAGNOSIS — Z86718 Personal history of other venous thrombosis and embolism: Secondary | ICD-10-CM | POA: Insufficient documentation

## 2024-10-11 DIAGNOSIS — N400 Enlarged prostate without lower urinary tract symptoms: Secondary | ICD-10-CM | POA: Insufficient documentation

## 2024-10-11 DIAGNOSIS — I4719 Other supraventricular tachycardia: Secondary | ICD-10-CM | POA: Insufficient documentation

## 2024-10-15 ENCOUNTER — Telehealth: Payer: Self-pay | Admitting: *Deleted

## 2024-10-15 NOTE — Transitions of Care (Post Inpatient/ED Visit) (Signed)
 10/15/2024  Name: Scott Oneal MRN: [id number] DOB: Mar 08, 1941  Today's TOC FU Call Status: Today's TOC FU Call Status:: Successful TOC FU Call Completed TOC FU Call Complete Date: 10/15/24  Patient's Name and Date of Birth confirmed. Name, DOB  Transition Care Management Follow-up Telephone Call Date of Discharge: 10/14/24 Discharge Facility: Other ([facility]) Name of Other ([facility]) Discharge Facility: [facility] Type of Discharge: Inpatient Admission Primary Inpatient Discharge Diagnosis:: AKI; weakness, kidney stone How have you been since you were released from the hospital?: Better (I am better just moving slow.  I live at an ALF and there is a [facility] here that can be here anytime if I need anything.  They handle all of our medications and so I can't really review anything today; as far as I know they did not make any changes) Any questions or concerns?: No  Items Reviewed: Did you receive and understand the discharge instructions provided?: Yes (briefly reviewed with patient who reports he has not reveiewed at home yet: verbalizes fair understanding of same - outside hospital AVS) Medications obtained,verified, and reconciled?: No (Declined medication review; reports staff at ALF manages all aspects of medication admministration and denies questions/ concerns around medications today - tells me no changes were made to my medications- unable to verify from OSH notes) Medications Not Reviewed Reasons:: Other: (Patient declined) Any new allergies since your discharge?: No Dietary orders reviewed?: Yes Type of Diet Ordered:: Regular food Do you have support at home?: Yes People in Home [RPT]: spouse Name of Support/Comfort Primary Source: Reports independent in self-care activities; resides with supportive spouse in ALF; spouse, local family- son, and ALF staff: assists as/ if needed/ indicated  Medications Reviewed Today: Medications Reviewed Today     Reviewed by  [employer] M, RN (Registered Nurse) on 10/15/24 at 1522  Med List Status: <None>   Medication Order Taking? Sig Documenting Provider Last Dose Status Informant  acetaminophen  (TYLENOL ) 650 MG CR tablet [id number]  Take 650 mg by mouth in the morning. [provider]  Active Self  aspirin  EC 81 MG tablet [id number]  Take 1 tablet (81 mg total) by mouth daily. Swallow whole. Wonda Sharper, MD  Active   calcitRIOL  (ROCALTROL ) 0.25 MCG capsule [id number]  TAKE 1 CAPSULE (0.25 MCG TOTAL) BY MOUTH IN THE MORNING. Plotnikov, Aleksei V, MD  Active   carvedilol  (COREG ) 6.25 MG tablet [id number]  Take 1 tablet (6.25 mg total) by mouth in the morning and at bedtime. Wonda Sharper, MD  Active   cephALEXin  (KEFLEX ) 500 MG capsule [id number]  Take 500 mg by mouth 3 (three) times daily. [provider]  Active   Cholecalciferol  (VITAMIN D3) 50 MCG (2000 UT) TABS [id number]  Take 2,000 Units by mouth daily. [provider]  Active Self  clindamycin  (CLEOCIN ) 300 MG capsule [id number]  Take 1 capsule (300 mg total) by mouth 3 (three) times daily. Gershon Donnice SAUNDERS, DPM  Active   clotrimazole -betamethasone  (LOTRISONE ) cream [id number]  APPLY 1 APPLICATION TOPICALLY DAILY Gershon Donnice SAUNDERS, DPM  Active   collagenase  (SANTYL ) 250 UNIT/GM ointment [id number]  Apply 1 Application topically daily. Gershon Donnice SAUNDERS, DPM  Active   gabapentin  (NEURONTIN ) 300 MG capsule [id number]  Take 1 capsule (300 mg total) by mouth 2 (two) times daily. Plotnikov, Aleksei V, MD  Active   levothyroxine  (SYNTHROID ) 175 MCG tablet [id number]  Take 1 tablet (175 mcg total) by mouth daily. Plotnikov, Aleksei V, MD  Active   lipase/protease/amylase (CREON ) 36000 UNITS CPEP  capsule [id number]  Take 1-2 capsules (36,000-72,000 Units total) by mouth 3 (three) times daily before meals.  Patient taking differently: Take 72,000 Units by mouth 3 (three) times daily before meals. Per patient taking 2 tablets three times a day    Plotnikov, Aleksei V, MD  Active Self  Multiple Vitamins-Minerals (PRESERVISION AREDS 2) CAPS [id number]  Take 1 capsule by mouth in the morning and at bedtime. [provider]  Active Self  mupirocin  ointment (BACTROBAN ) 2 % [id number]  On leg wound w/dressing change qd or bid Plotnikov, Aleksei V, MD  Active   pantoprazole  (PROTONIX ) 40 MG tablet [id number]  TAKE 1 TABLET BY MOUTH TWICE A DAY Plotnikov, Aleksei V, MD  Active   predniSONE  (DELTASONE ) 10 MG tablet [id number]  Take 1 tablet (10 mg total) by mouth daily with breakfast. Plotnikov, Aleksei V, MD  Active   Propylene Glycol (SYSTANE BALANCE) 0.6 % SOLN [id number]  Place 1 drop into both eyes 3 (three) times daily as needed (for dryness). [provider]  Active Self  Syringe/Needle, Disp, (SYRINGE 3CC/23GX1) 23G X 1 3 ML MISC [id number]  Use for IM inj q 2 wks Plotnikov, Aleksei V, MD  Active   tadalafil  (CIALIS ) 5 MG tablet [id number]  Take 1 tablet (5 mg total) by mouth daily. Plotnikov, Aleksei V, MD  Active   testosterone  cypionate (DEPOTESTOSTERONE CYPIONATE) 200 MG/ML injection [id number]  INJECT INTO MUSCLE EVERY 2 WEEKS Plotnikov, Aleksei V, MD  Active   torsemide  (DEMADEX ) 20 MG tablet [id number]  Take 2 tablets (40 mg total) by mouth daily. Plotnikov, Aleksei V, MD  Active   traMADol  (ULTRAM ) 50 MG tablet [id number]  Take 1 tablet (50 mg total) by mouth every 6 (six) hours as needed for severe pain (pain score 7-10). Plotnikov, Aleksei V, MD  Active   triamcinolone  cream (KENALOG ) 0.5 % [id number]  Apply 1 Application topically 3 (three) times daily. Plotnikov, Karlynn GAILS, MD  Active            Home Care and Equipment/Supplies: Were Home Health Services Ordered?: No Any new equipment or medical supplies ordered?: No  Functional Questionnaire: Do you need assistance with bathing/showering or dressing?: No Do you need assistance with meal preparation?: Yes (ALF provides meals per patient report) Do you need  assistance with eating?: No Do you have difficulty maintaining continence: No Do you need assistance with getting out of bed/getting out of a chair/moving?: No Do you have difficulty managing or taking your medications?: Yes (ALF staff manages all daily medications per patient report)  Follow up appointments reviewed: PCP Follow-up appointment confirmed?: No (care coordination outreach in real-time with scheduling care guide to schedule hospital follow up PCP appointment- scheduling care guide reports she will contact patient to schedule post- TOC call completion) MD Provider Line Number:[phone] Given: No (verified well-established with current PCP) Specialist Hospital Follow-up appointment confirmed?: Yes Date of Specialist follow-up appointment?: 10/16/24 Follow-Up Specialty Provider:: the knee doctor at [facility] Do you need transportation to your follow-up appointment?: No Do you understand care options if your condition(s) worsen?: Yes-patient verbalized understanding  SDOH Interventions Today    Flowsheet Row Most Recent Value  SDOH Interventions   Food Insecurity Interventions Intervention Not Indicated  [Reports meals are provided by ALF staff]  Housing Interventions Intervention Not Indicated  [Reports resides at ALF: [facility]]  Transportation Interventions Intervention Not Indicated  [ALF provides transportation and local son assists as well as indicated]  Utilities Interventions Intervention Not Indicated   See  TOC assessment tabs for additional assessment/ TOC intervention information  Patient declines need for ongoing/ further care management/ coordination outreach; declines enrollment in 30-day TOC program- declines taking my direct phone number should needs/ concerns arise post-TOC call   Pls call/ message for questions,  Pesach Frisch Mckinney Grier Czerwinski, RN, BSN, CCRN Alumnus RN Care Manager  Transitions of Care  [facility] - [facility] [phone]: direct  office

## 2024-10-16 ENCOUNTER — Telehealth: Payer: Self-pay

## 2024-10-16 NOTE — Telephone Encounter (Signed)
 Copied from CRM #[id number]. Topic: Clinical - Request for Lab/Test Order >> Oct 16, 2024 11:46 AM Burnard DEL wrote: Reason for CRM: [facility] patient currently [facility] called stating that his order for testosterone  injections have expired and they are needing a new order to be sent to pharmacy.  [facility] - [place], [place] - [address]  Phone: [phone] Fax: [phone]

## 2024-10-17 ENCOUNTER — Ambulatory Visit: Admitting: Family Medicine

## 2024-10-17 ENCOUNTER — Ambulatory Visit

## 2024-10-17 ENCOUNTER — Ambulatory Visit: Payer: Self-pay

## 2024-10-17 ENCOUNTER — Encounter: Payer: Self-pay | Admitting: Family Medicine

## 2024-10-17 VITALS — BP 122/78 | HR 50 | Temp 98.6°F | Ht 72.0 in | Wt 194.8 lb

## 2024-10-17 DIAGNOSIS — R233 Spontaneous ecchymoses: Secondary | ICD-10-CM

## 2024-10-17 DIAGNOSIS — M25512 Pain in left shoulder: Secondary | ICD-10-CM

## 2024-10-17 DIAGNOSIS — R221 Localized swelling, mass and lump, neck: Secondary | ICD-10-CM

## 2024-10-17 MED ORDER — TESTOSTERONE CYPIONATE 200 MG/ML IM SOLN: INTRAMUSCULAR | 5 refills | Status: AC

## 2024-10-17 NOTE — Telephone Encounter (Signed)
 FYI Only or Action Required?: FYI only for provider: [facility].  Patient was last seen in primary care on 08/28/2024 by Plotnikov, Karlynn GAILS, MD.  Called Nurse Triage reporting Shoulder Pain.  Symptoms began several days ago.  Interventions attempted: Prescription medications: Tramadol , notes from wife, reports no improvement.  Symptoms are: unchanged.  Triage Disposition: See HCP Within 4 Hours (Or PCP Triage)  Patient/caregiver understands and will follow disposition?: Yes     Copied from CRM #[id number]. Topic: Clinical - Red Word Triage >> Oct 17, 2024  8:55 AM Suzen RAMAN wrote: Red Word that prompted transfer to Nurse Triage: shoulder pain; requesting an order for a X-ray be placed. Reason for Disposition  [1] Shoulder pains with exertion (e.g., walking) AND [2] pain goes away on resting AND [3] not present now  Answer Assessment - Initial Assessment Questions Pt reports he was discharged from hospital Monday afternoon and notes the L shoulder pain began Tuesday. Pt denies SOB, CP, unusual sweating, numbness, weakness, vision changes, HA. This RN educated pt on home care, new-worsening symptoms, when to call back/seek emergent care. Pt verbalized understanding and agrees to plan.     1. ONSET: When did the pain start?     X 3 days 2. LOCATION: Where is the pain located?     Left shoulder 3. PAIN: How bad is the pain? (Scale 1-10; or mild, moderate, severe)     Intermittent, up to 10/10 4. WORK OR EXERCISE: Has there been any recent work or exercise that involved this part of the body?     Pt reports he was discharged from hospital Monday afternoon 5. CAUSE: What do you think is causing the shoulder pain?     Unknown 6. OTHER SYMPTOMS: Do you have any other symptoms? (e.g., neck pain, swelling, rash, fever, numbness, weakness)     Pain radiates into left neck intermittently  Protocols used: Shoulder  Pain-A-AH

## 2024-10-17 NOTE — Telephone Encounter (Signed)
Please schedule office visit with any provider.  Thanks

## 2024-10-17 NOTE — Telephone Encounter (Signed)
 Okay.  Done.  Thanks

## 2024-10-17 NOTE — Addendum Note (Signed)
 Addended by: Careem Yasui V on: 10/17/2024 10:40 PM   Modules accepted: Orders

## 2024-10-17 NOTE — Progress Notes (Unsigned)
 Established Patient Office Visit   Subjective  Patient ID: Scott Oneal, male    DOB: [birth year]  Age: 83 y.o. MRN: [id number]  Chief Complaint  Patient presents with   Acute Visit    Patient is here for Left shoulder pain, when shoulder in use its a jabbing pain, can't pick anything up with that arm pain scale 8/10.    HPI  Patient Active Problem List   Diagnosis Date Noted   Anemia of chronic renal failure 09/25/2024   Ear lesion 08/28/2024   Ulcer of lower extremity with fat layer exposed (HCC) 03/14/2024   Stomatitis 01/29/2024   Knee pain, bilateral 01/10/2024   Dysphagia 10/03/2023   Knee pain, chronic 10/03/2023   Bladder neck obstruction 09/05/2023   Heart failure with mildly reduced ejection fraction (HFmrEF) (HCC) 07/24/2023   Mixed hyperlipidemia 07/24/2023   CKD (chronic kidney disease) stage 4, GFR 15-29 ml/min (HCC) 07/24/2023   Elevated alkaline phosphatase level 07/24/2023   Tachycardia 07/04/2023   Lumbar spondylosis 05/30/2023   Vasomotor rhinitis 05/15/2023   Situational depression 02/09/2023   Dyslipidemia 01/12/2023   Familial tremor 01/12/2023   Gait disorder 12/26/2022   Neuropathic pain 12/02/2022   Claudication 09/20/2022   Postphlebitic syndrome 08/11/2022   Anticoagulant long-term use 06/29/2022   Absent foot pulse 06/23/2022   Cold left foot 06/23/2022   Leg pain, central, left 06/23/2022   DVT (deep venous thrombosis) (HCC) 06/23/2022   Acute on chronic kidney failure 06/23/2022   CAD S/P percutaneous coronary angioplasty 06/23/2022   Arthritis 06/23/2022   PMR (polymyalgia rheumatica) 05/31/2022   Spinal stenosis of lumbar region 12/27/2021   Hemicrania 12/27/2021   Vision changes 12/27/2021   Postural dizziness with presyncope 12/13/2021   Erythema of lower extremity 09/08/2021   History of cellulitis 09/08/2021   Venous stasis dermatitis 09/08/2021   Kidney stone 06/21/2021   Dermatitis  06/08/2021   Pancreatic insufficiency 05/31/2021   Lower extremity weakness 05/31/2021   Tinea pedis 05/31/2021   Nephrolithiasis 05/13/2021   Abnormal TSH 04/14/2021   Cellulitis of lower leg 04/05/2021   Dehydration 02/24/2021   Congestive heart failure (HCC) 03/02/2020   Diastolic dysfunction 02/06/2020   Anginal equivalent    Jaw pain 01/08/2020   Hip pain 10/10/2019   Night sweats 07/25/2019   Hand pain, right 04/24/2019   Degeneration of lumbar intervertebral disc 01/01/2019   Coccyx pain 11/13/2018   Hip bursitis, left 09/10/2018   Sciatica associated with disorder of lumbar spine 09/10/2018   Peroneal neuropathy at knee, left 07/09/2018   Macular degeneration 04/26/2018   Osteoarthritis of knee 01/08/2018   Bilateral buttock pain 11/29/2017   Cough 09/15/2017   Bursitis of hip, right 07/18/2017   Ischial bursitis 05/17/2017   Asthmatic bronchitis 05/17/2017   Elbow pain, left 11/21/2016   Irregular heart beat 09/27/2016   Hiccups 07/14/2016   Edema 06/06/2016   Insomnia 06/06/2016   Thigh pain 03/15/2016   Tinnitus 09/15/2015   Cramps of lower extremity 09/15/2015   UTI (urinary tract infection) 06/15/2015   Umbilical hernia 06/15/2015   Lower GI bleed 11/26/2014   Rectal bleeding 11/25/2014   History of colonic polyps 09/12/2014   Left foot pain 08/11/2014   Right foot pain 06/10/2014   Oral candidiasis 05/15/2014   Drug rash 04/30/2014   Low back pain 01/29/2014   Anxiety disorder 11/27/2013   Arthralgia 09/30/2013   Preop exam for internal medicine 06/19/2013   Preoperative clearance 06/19/2013   Urinary frequency 12/12/2012  Actinic keratoses 12/12/2012   Thrombocytopenia 11/19/2012   Well adult exam 09/10/2012   Shoulder pain, left 09/10/2012   Warts 09/10/2012   Headache 11/29/2011   Hyperkalemia 08/01/2011   Neuropathy of hand 08/01/2011   Numbness 04/28/2011   Fatigue 04/28/2011    Cellulitis of trunk 04/21/2011   Chalazion 07/07/2010   Disorder resulting from impaired renal function 03/10/2010   Neoplasm of uncertain behavior of skin 11/09/2009   PSA, INCREASED 11/09/2009   HYPERCHOLESTEROLEMIA 08/31/2009   Hypogonadism in male 07/03/2008   BRADYCARDIA 07/03/2008   History of colonic polyps 07/02/2008   Osteoporosis 06/03/2008   CHOLELITHIASIS 04/29/2008   Calculus of gallbladder 04/29/2008   Celiac disease 04/29/2008   WEIGHT LOSS 04/08/2008   Weight loss 04/08/2008   GERD (gastroesophageal reflux disease) 04/02/2008   TUBULOVILLOUS ADENOMA, COLON 04/01/2008   Moderate persistent asthma without complication 01/31/2008   Diarrhea 01/31/2008   Hypothyroidism 08/11/2007   Gout 08/11/2007   Essential hypertension 08/11/2007   MYOCARDIAL INFARCTION, HX OF 08/11/2007   Coronary artery disease involving native coronary artery of native heart without angina pectoris 08/11/2007   Past Medical History:  Diagnosis Date   Allergy     Arthritis    Calculus of gallbladder without mention of cholecystitis    Cataract    bilateral cateracts removed   Celiac disease    Chronic kidney disease    stage 3   Coronary atherosclerosis of unspecified type of vessel, native or graft    Dermatitis 06/08/2021   Erythema of lower extremity 09/08/2021   Esophageal reflux    Glaucoma    History of cellulitis 09/08/2021   History of kidney stones    Loss of weight    Lower extremity weakness 05/31/2021   Macular degeneration    Rt eye . injections   Old myocardial infarction    Osteoporosis, unspecified    Other malaise and fatigue    Other specified cardiac dysrhythmias(427.89)    Other testicular hypofunction    Pancreatic insufficiency 05/31/2021   Peripheral vascular disease    Personal history of colonic polyps    Postural dizziness with presyncope 12/13/2021   Pure hypercholesterolemia    Thrombocytopenia     chronic, per PCP notes dating back to 2009   Tinea pedis 05/31/2021   Unspecified asthma(493.90)    Unspecified essential hypertension    Unspecified hypothyroidism    Venous stasis dermatitis 09/08/2021   Past Surgical History:  Procedure Laterality Date   ARTERY BIOPSY Left 02/18/2022   Procedure: LEFT TEMPORAL ARTERY BIOPSY;  Surgeon: Stevie Herlene Righter, MD;  Location: [facility];  Service: General;  Laterality: Left;   cataract surgery Bilateral    COLONOSCOPY  06/26/2017   Aneita   COLONOSCOPY WITH PROPOFOL  N/A 11/27/2014   Procedure: COLONOSCOPY WITH PROPOFOL ;  Surgeon: Toribio SHAUNNA Cedar, MD;  Location: [facility]COPY;  Service: Endoscopy;  Laterality: N/A;   CORONARY ANGIOPLASTY     CORONARY STENT PLACEMENT  2011   Cypher; in distal circumflex artery   CYSTOSCOPY  1998   IR URETERAL STENT LEFT NEW ACCESS W/O SEP NEPHROSTOMY CATH  06/21/2021   KNEE SURGERY     right   LEFT HEART CATH AND CORONARY ANGIOGRAPHY N/A 01/31/2020   Procedure: LEFT HEART CATH AND CORONARY ANGIOGRAPHY;  Surgeon: Claudene Victory ORN, MD;  Location: [facility];  Service: Cardiovascular;  Laterality: N/A;   NEPHROLITHOTOMY Left 06/21/2021   Procedure: NEPHROLITHOTOMY PERCUTANEOUS;  Surgeon: Matilda Senior, MD;  Location: [facility];  Service:  Urology;  Laterality: Left;  90 MINS   PERIPHERAL VASCULAR THROMBECTOMY Left 06/24/2022   Procedure: PERIPHERAL VASCULAR THROMBECTOMY;  Surgeon: Magda Debby SAILOR, MD;  Location: [facility];  Service: Cardiovascular;  Laterality: Left;   SHOULDER SURGERY Left 07/2013   Dr Graylon   UPPER GASTROINTESTINAL ENDOSCOPY     Social History[1] Family History  Problem Relation Age of Onset   Lymphoma Father    Hypertension Other    Vision loss Maternal Grandmother    Colon cancer Son 16   Asthma Neg Hx    Esophageal cancer Neg Hx    Rectal cancer Neg Hx    Stomach cancer Neg Hx    Colon polyps Neg Hx    Allergies[2]  ROS Negative  unless stated above    Objective:     BP 122/78 (BP Location: Left Arm, Patient Position: Sitting, Cuff Size: Large)   Pulse (!) 50   Temp 98.6 F (37 C) (Oral)   Ht 6' (1.829 m)   Wt 194 lb 12.8 oz (88.4 kg)   SpO2 98%   BMI 26.42 kg/m  BP Readings from Last 3 Encounters:  10/17/24 122/78  07/16/24 112/72  06/25/24 118/78   Wt Readings from Last 3 Encounters:  10/17/24 194 lb 12.8 oz (88.4 kg)  10/15/24 195 lb (88.5 kg)  07/16/24 196 lb (88.9 kg)      Physical Exam     10/15/2024    3:20 PM 04/23/2024    2:48 PM 01/25/2024    3:42 PM  Depression screen PHQ 2/9  Decreased Interest 0 0 0  Down, Depressed, Hopeless 0 0 0  PHQ - 2 Score 0 0 0  Altered sleeping   0  Tired, decreased energy   0  Change in appetite   0  Feeling bad or failure about yourself    0  Trouble concentrating   0  Moving slowly or fidgety/restless   0  Suicidal thoughts   0  PHQ-9 Score   0   Difficult doing work/chores   Not difficult at all     Data saved with a previous flowsheet row definition       No data to display           No results found for any visits on 10/17/24.    Assessment & Plan:   There are no diagnoses linked to this encounter.  No follow-ups on file.   Clotilda JONELLE Single, MD      [1] Social History Tobacco Use   Smoking status: Never    Passive exposure: Never   Smokeless tobacco: Never  Vaping Use   Vaping status: Never Used  Substance Use Topics   Alcohol use: No   Drug use: No  [2] Allergies Allergen Reactions   Biaxin  [Clarithromycin ]     Dizziness   Lipitor [Atorvastatin ]     weakness   Tizanidine      Felt funny   Allantoin-Pramoxine Other (See Comments)    Pt does't remember reaction   Amlodipine  Swelling    Edema    Amoxicillin  Rash and Other (See Comments)    Rash on arms that developed towards end of 7 day treatment   Benzalkonium Chloride Itching and Rash   Doxycycline  Photosensitivity and Rash   Metoprolol  Tartrate Other (See Comments)    Fatigue

## 2024-10-17 NOTE — Patient Instructions (Signed)
 We will get an x-ray of your shoulder to evaluate for any possible fractures that occurred during your fall.  At present it seems like you may have strained the muscles of your rotator cuff.  This will not necessarily show up on x-ray.  You can use Biofreeze, IcyHot, lidocaine  cream/patches, heat or ice, Tylenol  along with your regular medications to help with symptoms.  A referral to Ortho was placed.  They will call you about setting up the appointment.

## 2024-10-18 ENCOUNTER — Telehealth: Payer: Self-pay

## 2024-10-18 ENCOUNTER — Ambulatory Visit: Payer: Self-pay | Admitting: Family Medicine

## 2024-10-18 NOTE — Telephone Encounter (Signed)
 Please sched an OV upon pts return call to the clinic as PCP wants him to be seen by any provider on site for his concerns and order fro x-ray.

## 2024-10-18 NOTE — Telephone Encounter (Signed)
 Copied from CRM #[id number]. Topic: Clinical - Home Health Verbal Orders >> Oct 18, 2024  2:55 PM Tiffini S wrote: Caller/Agency: Tris with [facility]  Callback Number: [id number]  Service Requested: Physical Therapy Frequency: 2 weeks 2, 1 week 3 Any new concerns about the patient? Yes, swelling in lower legs/ acute kidney injury, weakness

## 2024-10-21 NOTE — Telephone Encounter (Signed)
 Okay. Thank you.

## 2024-10-22 ENCOUNTER — Ambulatory Visit: Admitting: Podiatry

## 2024-10-22 DIAGNOSIS — L600 Ingrowing nail: Secondary | ICD-10-CM | POA: Diagnosis not present

## 2024-10-22 DIAGNOSIS — R234 Changes in skin texture: Secondary | ICD-10-CM

## 2024-10-22 DIAGNOSIS — M79675 Pain in left toe(s): Secondary | ICD-10-CM | POA: Diagnosis not present

## 2024-10-22 DIAGNOSIS — L03031 Cellulitis of right toe: Secondary | ICD-10-CM | POA: Diagnosis not present

## 2024-10-22 DIAGNOSIS — B351 Tinea unguium: Secondary | ICD-10-CM | POA: Diagnosis not present

## 2024-10-22 DIAGNOSIS — M79674 Pain in right toe(s): Secondary | ICD-10-CM | POA: Diagnosis not present

## 2024-10-22 DIAGNOSIS — L02611 Cutaneous abscess of right foot: Secondary | ICD-10-CM

## 2024-10-22 MED ORDER — MUPIROCIN 2 % EX OINT: 1.0000 | TOPICAL_OINTMENT | Freq: Two times a day (BID) | CUTANEOUS | 2 refills | Status: DC

## 2024-10-22 MED ORDER — CLINDAMYCIN HCL 300 MG PO CAPS: 300.0000 mg | ORAL_CAPSULE | Freq: Three times a day (TID) | ORAL | 0 refills | Status: DC

## 2024-10-22 NOTE — Progress Notes (Signed)
 Subjective: Chief Complaint  Patient presents with   RFC     Patient presents today for RFC and possible ingrown on his R hallux toe   83 year old male presents the office the above concerns.  He states he thinks he has an ingrown toe on the right big toenails is getting sore.  Has not seen any drainage or pus.  He wears compression socks and states that the socks get hung on the toenail.  In general his nails are also thick and discolored he was to have them trimmed as they are all causing discomfort.  He states the skin is opened up into a crack on the back of the left heel.  To keep antibiotic ointment on this area daily.  Denies any drainage or pus.  No other concerns.   Objective: AAO x3, NAD DP/PT pulses palpable bilaterally, CRT less than 3 seconds.  Feet are warm and perfused. In general the nails are hypertrophic, dystrophic with yellow, brown discoloration.  He has tenderness nails 1-5 bilaterally.  In particular the right big toenail the nail is dystrophic with [facility] ration of nail borders which I debrided today.  There is no drainage or pus identified.  There is localized edema erythema on the dorsal hallux but there is no ascending cellulitis.  No fluctuation or crepitation.  There is no malodor.  The posterior aspect left heel is a superficial skin fissure without any drainage or pus.  No surrounding erythema or signs of infection. No pain with calf compression, swelling, warmth, erythema  Assessment: Symptomatic onychosis, right hallux ingrown toenail with cellulitis localized; left posterior heel fissure  Plan: Right ingrown toenail, localized cellulitis - Debrided the corner without any complications or bleeding.  Recommend antibiotic ointment dressing changes daily.  Prescribe mupirocin  ointment.  If no improvement in the next 1 to 2 weeks let us  know or sooner if there is any worsening.  Posterior skin fissure - This areas opened back up.  Recommending patient or medicine  change daily, offloading.  Monitor any signs or symptoms of infection.  If not resolved ask Ivelisse let us  know or sooner if there is any worsening or changes.  Symptomatic onychomycosis -Nails are debrided x 10 without complications or bleeding  Return in about 3 months (around 01/20/2025) for nail trim .  Scott Oneal DPM

## 2024-10-22 NOTE — Patient Instructions (Signed)
 Clindamycin  for the right big toe infection.  If not resolved in next 1 to 2 weeks please follow-up in the office or sooner if there is any changes or worsening.  Apply mupirocin  ointment to the right big toe, left posterior heel.  Monitor for any signs/symptoms of infection. Call the office immediately if any occur or go directly to the emergency room. Call with any questions/concerns.

## 2024-10-24 DIAGNOSIS — N184 Chronic kidney disease, stage 4 (severe): Secondary | ICD-10-CM | POA: Diagnosis not present

## 2024-10-24 DIAGNOSIS — I872 Venous insufficiency (chronic) (peripheral): Secondary | ICD-10-CM | POA: Diagnosis not present

## 2024-10-24 DIAGNOSIS — F4321 Adjustment disorder with depressed mood: Secondary | ICD-10-CM | POA: Diagnosis not present

## 2024-10-24 DIAGNOSIS — M48061 Spinal stenosis, lumbar region without neurogenic claudication: Secondary | ICD-10-CM | POA: Diagnosis not present

## 2024-10-24 DIAGNOSIS — I2510 Atherosclerotic heart disease of native coronary artery without angina pectoris: Secondary | ICD-10-CM | POA: Diagnosis not present

## 2024-10-24 DIAGNOSIS — Z7982 Long term (current) use of aspirin: Secondary | ICD-10-CM | POA: Diagnosis not present

## 2024-10-24 DIAGNOSIS — I739 Peripheral vascular disease, unspecified: Secondary | ICD-10-CM | POA: Diagnosis not present

## 2024-10-24 DIAGNOSIS — F419 Anxiety disorder, unspecified: Secondary | ICD-10-CM | POA: Diagnosis not present

## 2024-10-24 DIAGNOSIS — I5022 Chronic systolic (congestive) heart failure: Secondary | ICD-10-CM | POA: Diagnosis not present

## 2024-10-24 DIAGNOSIS — M109 Gout, unspecified: Secondary | ICD-10-CM | POA: Diagnosis not present

## 2024-10-24 DIAGNOSIS — I130 Hypertensive heart and chronic kidney disease with heart failure and stage 1 through stage 4 chronic kidney disease, or unspecified chronic kidney disease: Secondary | ICD-10-CM | POA: Diagnosis not present

## 2024-10-24 DIAGNOSIS — M810 Age-related osteoporosis without current pathological fracture: Secondary | ICD-10-CM | POA: Diagnosis not present

## 2024-10-25 ENCOUNTER — Other Ambulatory Visit (HOSPITAL_COMMUNITY): Payer: Self-pay | Admitting: Nephrology

## 2024-10-25 NOTE — Telephone Encounter (Signed)
 Auth Submission: NO AUTH NEEDED Site of care: [facility] Payer: [employer] Medication & CPT/J Code(s) submitted: Venofer (Iron Sucrose) J1756 Diagnosis Code: N18.9, D63.1 Route of submission (phone, fax, portal):  Phone # Fax # Auth type: Buy/Bill HB Units/visits requested: 300MG  X 3 DOSES Reference number:  Approval from: 09/25/2024 to 11/30/24     Changed SOC to [facility].

## 2024-10-28 ENCOUNTER — Inpatient Hospital Stay: Admitting: Internal Medicine

## 2024-11-05 ENCOUNTER — Other Ambulatory Visit: Payer: Self-pay | Admitting: Internal Medicine

## 2024-11-05 NOTE — Telephone Encounter (Unsigned)
 Copied from CRM #[id number]. Topic: Clinical - Medication Refill >> Nov 05, 2024  9:47 AM Burnard DEL wrote: Medication: traMADol  (ULTRAM ) 50 MG tablet  Has the patient contacted their pharmacy? Yes (Agent: If no, request that the patient contact the pharmacy for the refill. If patient does not wish to contact the pharmacy document the reason why and proceed with request.) (Agent: If yes, when and what did the pharmacy advise?)  This is the patient's preferred pharmacy:  [facility] [place], [place] - [address] [place] [address] Phone: [phone] Fax: [phone]  Is this the correct pharmacy for this prescription? Yes If no, delete pharmacy and type the correct one.   Has the prescription been filled recently? No  Is the patient out of the medication? Yes  Has the patient been seen for an appointment in the last year OR does the patient have an upcoming appointment? Yes  Can we respond through MyChart? Yes  Agent: Please be advised that Rx refills may take up to 3 business days. We ask that you follow-up with your pharmacy.

## 2024-11-06 MED ORDER — TRAMADOL HCL 50 MG PO TABS: 50.0000 mg | ORAL_TABLET | Freq: Four times a day (QID) | ORAL | 3 refills | Status: DC | PRN

## 2024-11-07 ENCOUNTER — Ambulatory Visit: Admitting: Internal Medicine

## 2024-11-08 ENCOUNTER — Telehealth (HOSPITAL_COMMUNITY): Payer: Self-pay

## 2024-11-08 NOTE — Telephone Encounter (Signed)
 Auth Submission: NO AUTH NEEDED Site of care: Site of care: [facility] Payer: [employer] Medication & CPT/J Code(s) submitted: Venofer (Iron Sucrose) J1756 Diagnosis Code:  Route of submission (phone, fax, portal):  Phone # Fax # Auth type: Buy/Bill HB Units/visits requested: 300mg  x 3 doses Reference number:  Approval from: 09/25/24 to 03/25/25

## 2024-11-11 ENCOUNTER — Encounter: Payer: Self-pay | Admitting: Emergency Medicine

## 2024-11-11 ENCOUNTER — Ambulatory Visit: Admitting: Emergency Medicine

## 2024-11-11 VITALS — BP 106/71 | HR 78 | Temp 98.1°F | Resp 18 | Ht 72.0 in | Wt 194.8 lb

## 2024-11-11 DIAGNOSIS — J209 Acute bronchitis, unspecified: Secondary | ICD-10-CM

## 2024-11-11 MED ORDER — CEFDINIR 300 MG PO CAPS: 300.0000 mg | ORAL_CAPSULE | Freq: Two times a day (BID) | ORAL | 0 refills | Status: AC

## 2024-11-11 MED ORDER — PREDNISONE 20 MG PO TABS: 40.0000 mg | ORAL_TABLET | Freq: Every day | ORAL | 0 refills | Status: AC

## 2024-11-11 NOTE — Progress Notes (Addendum)
 "  Assessment & Plan:  Acute bronchitis/asthma exacerbation. No viral testing indicated 1w into sx.  Pred burst No fevers Less likely bacterial infection at this time, but high risk patient so did rx abx to take if no improvement in the next few days or if getting worse      Patient Instructions  OK to take Delsym for cough Start prednisone  for cough and wheeze Add in cefdinir  if not improving in the net few days, or if getting worse at any time Recheck in person if any new concerns about your breathing   [name], MSPAS, PA-C   Subjective:  Cough (Coughing, sneezing x 1 week. Denies fever. )   HPI: Scott Oneal is a 84 y.o. male presenting with acute cough Symptoms began about a week ago, hit a plateau.Wet cough but can't really bring anything up. Wheezing comes and goes. Occ sneezing.  Denies fever, CP, SOB.  Wife is feeling well No eval or treatment PTA     ROS: Negative unless specifically indicated above in HPI.   Relevant past medical history reviewed and updated as indicated.   Allergies and medications reviewed and updated.  Current Medications[1]  Allergies[2]    Objective:   Vitals:   11/11/24 0955  BP: 106/71  Pulse: 78  Temp: 98.1 F (36.7 C)  Resp: 18  Height: 6' (1.829 m)  Weight: 194 lb 12.8 oz (88.4 kg)  SpO2: 97%  BMI (Calculated): 26.41      Gen: appears well, alert, INAD Ears: Bilateral canal excess cerumen. Small visible portion of each TM without erythema.  Nose: mild mucosal edema, no rhinorrhea, both nares patent.  Mouth: Oral mucosa moist. Throat: cobblestone OP no exudate ulcerations or acute PND  Neck: supple no LAD Heart occ ectopic beats consistent with EKG tracing of 07/2024.  Lungs: Respiratory effort: normal.  Scattered end-exp wheeze. Normal O2 saturation on room ait           [1]  Current Outpatient Medications:    acetaminophen  (TYLENOL ) 650 MG CR tablet, Take 650 mg by mouth in the morning.,  Disp: , Rfl:    aspirin  EC 81 MG tablet, Take 1 tablet (81 mg total) by mouth daily. Swallow whole., Disp: , Rfl:    calcitRIOL  (ROCALTROL ) 0.25 MCG capsule, TAKE 1 CAPSULE (0.25 MCG TOTAL) BY MOUTH IN THE MORNING., Disp: 90 capsule, Rfl: 1   carvedilol  (COREG ) 6.25 MG tablet, Take 1 tablet (6.25 mg total) by mouth in the morning and at bedtime., Disp: 180 tablet, Rfl: 3   cefdinir  (OMNICEF ) 300 MG capsule, Take 1 capsule (300 mg total) by mouth 2 (two) times daily for 7 days., Disp: 14 capsule, Rfl: 0   Cholecalciferol  (VITAMIN D3) 50 MCG (2000 UT) TABS, Take 2,000 Units by mouth daily., Disp: , Rfl:    clotrimazole -betamethasone  (LOTRISONE ) cream, APPLY 1 APPLICATION TOPICALLY DAILY, Disp: 30 g, Rfl: 0   collagenase  (SANTYL ) 250 UNIT/GM ointment, Apply 1 Application topically daily., Disp: 30 g, Rfl: 0   gabapentin  (NEURONTIN ) 300 MG capsule, Take 1 capsule (300 mg total) by mouth 2 (two) times daily., Disp: 60 capsule, Rfl: 5   levothyroxine  (SYNTHROID ) 175 MCG tablet, Take 1 tablet (175 mcg total) by mouth daily., Disp: 90 tablet, Rfl: 3   lipase/protease/amylase (CREON ) 36000 UNITS CPEP capsule, Take 1-2 capsules (36,000-72,000 Units total) by mouth 3 (three) times daily before meals. (Patient taking differently: Take 72,000 Units by mouth 3 (three) times daily before meals. Per patient taking 2  tablets three times a day), Disp: 300 capsule, Rfl: 5   Multiple Vitamins-Minerals (PRESERVISION AREDS 2) CAPS, Take 1 capsule by mouth in the morning and at bedtime., Disp: , Rfl:    mupirocin  ointment (BACTROBAN ) 2 %, On leg wound w/dressing change qd or bid, Disp: 30 g, Rfl: 0   mupirocin  ointment (BACTROBAN ) 2 %, Apply 1 Application topically 2 (two) times daily., Disp: 30 g, Rfl: 2   pantoprazole  (PROTONIX ) 40 MG tablet, TAKE 1 TABLET BY MOUTH TWICE A DAY, Disp: 180 tablet, Rfl: 3   predniSONE  (DELTASONE ) 10 MG tablet, Take 1 tablet (10 mg total) by mouth daily with breakfast., Disp: 100 tablet, Rfl:  1   predniSONE  (DELTASONE ) 20 MG tablet, Take 2 tablets (40 mg total) by mouth daily for 5 days., Disp: 10 tablet, Rfl: 0   Propylene Glycol (SYSTANE BALANCE) 0.6 % SOLN, Place 1 drop into both eyes 3 (three) times daily as needed (for dryness)., Disp: , Rfl:    Syringe/Needle, Disp, (SYRINGE 3CC/23GX1) 23G X 1 3 ML MISC, Use for IM inj q 2 wks, Disp: 50 each, Rfl: 3   tadalafil  (CIALIS ) 5 MG tablet, Take 1 tablet (5 mg total) by mouth daily., Disp: 30 tablet, Rfl: 5   testosterone  cypionate (DEPOTESTOSTERONE CYPIONATE) 200 MG/ML injection, INJECT INTO MUSCLE EVERY 2 WEEKS, Disp: 4 mL, Rfl: 5   torsemide  (DEMADEX ) 20 MG tablet, Take 2 tablets (40 mg total) by mouth daily., Disp: 60 tablet, Rfl: 5   traMADol  (ULTRAM ) 50 MG tablet, Take 1 tablet (50 mg total) by mouth every 6 (six) hours as needed for severe pain (pain score 7-10)., Disp: 120 tablet, Rfl: 3   triamcinolone  cream (KENALOG ) 0.5 %, Apply 1 Application topically 3 (three) times daily., Disp: 45 g, Rfl: 1 [2]  Allergies Allergen Reactions   Biaxin  [Clarithromycin ]     Dizziness   Allantoin Other (See Comments)   Atorvastatin  Other (See Comments)    weakness  atorvastatin    Atorvastatin  Calcium  Other (See Comments)    atorvastatin  calcium    Tizanidine  Other (See Comments)    Felt funny  tizanidine    Allantoin-Pramoxine Other (See Comments)    Pt does't remember reaction   Amlodipine  Swelling and Other (See Comments)    Edema  Edema     Edema   Amoxicillin  Other (See Comments), Rash and Dermatitis    Rash on arms that developed towards end of 7 day treatment  amoxicillin    Benzalkonium Chloride Itching, Rash and Dermatitis    Other reaction(s): mild rash/itching   Doxycycline  Photosensitivity, Rash and Dermatitis    rash  doxycycline    Metoprolol Other (See Comments)    REACTION: fatigue    Fatigue   Metoprolol Tartrate Other (See Comments)    Fatigue   Pramoxine Other (See Comments)    Pt does't  remember reaction   "

## 2024-11-11 NOTE — Patient Instructions (Signed)
 OK to take Delsym for cough Start prednisone  for cough and wheeze Add in cefdinir  if not improving in the net few days, or if getting worse at any time Recheck in person if any new concerns about your breathing

## 2024-11-14 ENCOUNTER — Ambulatory Visit: Admitting: Internal Medicine

## 2024-11-14 ENCOUNTER — Encounter: Payer: Self-pay | Admitting: Internal Medicine

## 2024-11-14 VITALS — BP 142/82 | HR 77 | Ht 72.0 in | Wt 195.4 lb

## 2024-11-14 DIAGNOSIS — M5442 Lumbago with sciatica, left side: Secondary | ICD-10-CM | POA: Diagnosis not present

## 2024-11-14 DIAGNOSIS — I10 Essential (primary) hypertension: Secondary | ICD-10-CM | POA: Diagnosis not present

## 2024-11-14 DIAGNOSIS — F411 Generalized anxiety disorder: Secondary | ICD-10-CM | POA: Diagnosis not present

## 2024-11-14 DIAGNOSIS — E785 Hyperlipidemia, unspecified: Secondary | ICD-10-CM | POA: Diagnosis not present

## 2024-11-14 DIAGNOSIS — R609 Edema, unspecified: Secondary | ICD-10-CM

## 2024-11-14 DIAGNOSIS — E034 Atrophy of thyroid (acquired): Secondary | ICD-10-CM | POA: Diagnosis not present

## 2024-11-14 DIAGNOSIS — G8929 Other chronic pain: Secondary | ICD-10-CM

## 2024-11-14 DIAGNOSIS — I509 Heart failure, unspecified: Secondary | ICD-10-CM

## 2024-11-14 DIAGNOSIS — I5042 Chronic combined systolic (congestive) and diastolic (congestive) heart failure: Secondary | ICD-10-CM

## 2024-11-14 MED ORDER — TRAMADOL HCL 50 MG PO TABS: 50.0000 mg | ORAL_TABLET | Freq: Four times a day (QID) | ORAL | 3 refills | Status: AC | PRN

## 2024-11-14 NOTE — Assessment & Plan Note (Signed)
 Feeling better off Lipitor - d/c

## 2024-11-14 NOTE — Assessment & Plan Note (Signed)
 Cont on Lexapro

## 2024-11-14 NOTE — Assessment & Plan Note (Signed)
 He is not a surgical candidate for any back surgery per Dr. Shon Baton and Dr. Yevette Edwards He has had epidural injections by Dr. Drucie Ip Low back pain -  - severe 7-8/10 all the time. Oxycodone - it stopped working the other day and [name] stopped taking it.  The pain is worse.  He will take Percocet 10/325 up to 4 times a day.  Potential benefits of a long term opioids use as well as potential risks (i.e. addiction risk, apnea etc) and complications (i.e. Somnolence, constipation and others) were explained to the patient and were aknowledged.  Stable - use Tramadol prn  Potential benefits of a long term opioids use as well as potential risks (i.e. addiction risk, apnea etc) and complications (i.e. Somnolence, constipation and others) were explained to the patient and were aknowledged.

## 2024-11-14 NOTE — Assessment & Plan Note (Signed)
" °  BP Readings from Last 3 Encounters:  11/14/24 (!) 142/82  11/11/24 106/71  10/17/24 122/78   Continue w/Coreg , Hydralazine , Benicar   "

## 2024-11-14 NOTE — Assessment & Plan Note (Signed)
Chronic Cont on Levothroid

## 2024-11-14 NOTE — Assessment & Plan Note (Signed)
 Use Calf Compression Sleeve On Demadex  20 mg/d. Ok to take 40 mg to see if better Better

## 2024-11-14 NOTE — Assessment & Plan Note (Signed)
 Worse Cont on Rx - Coreg , Torsemide  On Demadex  20 mg/d. Ok to take 40 mg to see if better

## 2024-11-14 NOTE — Progress Notes (Signed)
 "  Subjective:  Patient ID: Scott Oneal, male    DOB: 22-Mar-1941  Age: 84 y.o. MRN: [id number]  CC: Medical Management of Chronic Issues (2 Month follow up)   HPI Scott Oneal presents for celiac disease, hypertension, leg edema, peripheral vascular disease, CHF.  Scott Oneal is planning to return back home soon  Outpatient Medications Prior to Visit  Medication Sig Dispense Refill   acetaminophen  (TYLENOL ) 650 MG CR tablet Take 650 mg by mouth in the morning.     aspirin  EC 81 MG tablet Take 1 tablet (81 mg total) by mouth daily. Swallow whole.     calcitRIOL  (ROCALTROL ) 0.25 MCG capsule TAKE 1 CAPSULE (0.25 MCG TOTAL) BY MOUTH IN THE MORNING. 90 capsule 1   carvedilol  (COREG ) 6.25 MG tablet Take 1 tablet (6.25 mg total) by mouth in the morning and at bedtime. 180 tablet 3   cefdinir  (OMNICEF ) 300 MG capsule Take 1 capsule (300 mg total) by mouth 2 (two) times daily for 7 days. 14 capsule 0   Cholecalciferol  (VITAMIN D3) 50 MCG (2000 UT) TABS Take 2,000 Units by mouth daily.     clotrimazole -betamethasone  (LOTRISONE ) cream APPLY 1 APPLICATION TOPICALLY DAILY 30 g 0   collagenase  (SANTYL ) 250 UNIT/GM ointment Apply 1 Application topically daily. 30 g 0   gabapentin  (NEURONTIN ) 300 MG capsule Take 1 capsule (300 mg total) by mouth 2 (two) times daily. 60 capsule 5   levothyroxine  (SYNTHROID ) 175 MCG tablet Take 1 tablet (175 mcg total) by mouth daily. 90 tablet 3   lipase/protease/amylase (CREON ) 36000 UNITS CPEP capsule Take 1-2 capsules (36,000-72,000 Units total) by mouth 3 (three) times daily before meals. (Patient taking differently: Take 72,000 Units by mouth 3 (three) times daily before meals. Per patient taking 2 tablets three times a day) 300 capsule 5   Multiple Vitamins-Minerals (PRESERVISION AREDS 2) CAPS Take 1 capsule by mouth in the morning and at bedtime.     mupirocin  ointment (BACTROBAN ) 2 % On leg wound w/dressing change qd or bid 30 g 0   mupirocin  ointment (BACTROBAN ) 2 %  Apply 1 Application topically 2 (two) times daily. 30 g 2   pantoprazole  (PROTONIX ) 40 MG tablet TAKE 1 TABLET BY MOUTH TWICE A DAY 180 tablet 3   predniSONE  (DELTASONE ) 10 MG tablet Take 1 tablet (10 mg total) by mouth daily with breakfast. 100 tablet 1   predniSONE  (DELTASONE ) 20 MG tablet Take 2 tablets (40 mg total) by mouth daily for 5 days. 10 tablet 0   Propylene Glycol (SYSTANE BALANCE) 0.6 % SOLN Place 1 drop into both eyes 3 (three) times daily as needed (for dryness).     Syringe/Needle, Disp, (SYRINGE 3CC/23GX1) 23G X 1 3 ML MISC Use for IM inj q 2 wks 50 each 3   tadalafil  (CIALIS ) 5 MG tablet Take 1 tablet (5 mg total) by mouth daily. 30 tablet 5   testosterone  cypionate (DEPOTESTOSTERONE CYPIONATE) 200 MG/ML injection INJECT INTO MUSCLE EVERY 2 WEEKS 4 mL 5   torsemide  (DEMADEX ) 20 MG tablet Take 2 tablets (40 mg total) by mouth daily. 60 tablet 5   triamcinolone  cream (KENALOG ) 0.5 % Apply 1 Application topically 3 (three) times daily. 45 g 1   traMADol  (ULTRAM ) 50 MG tablet Take 1 tablet (50 mg total) by mouth every 6 (six) hours as needed for severe pain (pain score 7-10). 120 tablet 3   No facility-administered medications prior to visit.    ROS: Review of Systems  Constitutional:  Negative for appetite change, fatigue and unexpected weight change.  HENT:  Negative for congestion, nosebleeds, sneezing, sore throat and trouble swallowing.   Eyes:  Negative for itching and visual disturbance.  Respiratory:  Negative for cough.   Cardiovascular:  Negative for chest pain, palpitations and leg swelling.  Gastrointestinal:  Negative for abdominal distention, blood in stool, diarrhea and nausea.  Genitourinary:  Negative for frequency and hematuria.  Musculoskeletal:  Positive for arthralgias, back pain and gait problem. Negative for joint swelling and neck pain.  Skin:  Negative for rash.  Neurological:  Negative for dizziness, tremors, speech difficulty and weakness.   Psychiatric/Behavioral:  Negative for agitation, dysphoric mood, sleep disturbance and suicidal ideas. The patient is not nervous/anxious.     Objective:  BP (!) 142/82   Pulse 77   Ht 6' (1.829 m)   Wt 195 lb 6.4 oz (88.6 kg)   SpO2 99%   BMI 26.50 kg/m   BP Readings from Last 3 Encounters:  11/14/24 (!) 142/82  11/11/24 106/71  10/17/24 122/78    Wt Readings from Last 3 Encounters:  11/14/24 195 lb 6.4 oz (88.6 kg)  11/11/24 194 lb 12.8 oz (88.4 kg)  10/17/24 194 lb 12.8 oz (88.4 kg)    Physical Exam Constitutional:      General: He is not in acute distress.    Appearance: Normal appearance. He is well-developed. He is obese. He is not ill-appearing.     Comments: NAD  Eyes:     Conjunctiva/sclera: Conjunctivae normal.     Pupils: Pupils are equal, round, and reactive to light.  Neck:     Thyroid : No thyromegaly.     Vascular: No JVD.  Cardiovascular:     Rate and Rhythm: Normal rate and regular rhythm.     Heart sounds: Normal heart sounds. No murmur heard.    No friction rub. No gallop.  Pulmonary:     Effort: Pulmonary effort is normal. No respiratory distress.     Breath sounds: Normal breath sounds. No wheezing or rales.  Chest:     Chest wall: No tenderness.  Abdominal:     General: Bowel sounds are normal. There is no distension.     Palpations: Abdomen is soft. There is no mass.     Tenderness: There is no abdominal tenderness. There is no guarding or rebound.  Musculoskeletal:        General: No tenderness. Normal range of motion.     Cervical back: Normal range of motion.     Right lower leg: Edema present.     Left lower leg: Edema present.  Lymphadenopathy:     Cervical: No cervical adenopathy.  Skin:    General: Skin is warm and dry.     Findings: No rash.  Neurological:     Mental Status: He is alert and oriented to person, place, and time.     Cranial Nerves: No cranial nerve deficit.     Motor: No abnormal muscle tone.      Coordination: Coordination normal.     Gait: Gait abnormal.     Deep Tendon Reflexes: Reflexes are normal and symmetric.  Psychiatric:        Behavior: Behavior normal.        Thought Content: Thought content normal.        Judgment: Judgment normal.   Trace to 1+ B LEs  Lab Results  Component Value Date   WBC 8.1 01/10/2024   HGB 14.5 01/10/2024  HCT 45.7 01/10/2024   PLT 170.0 01/10/2024   GLUCOSE 123 (H) 07/16/2024   CHOL 143 06/25/2022   TRIG 88 06/25/2022   HDL 42 06/25/2022   LDLCALC 83 06/25/2022   ALT 20 05/23/2024   AST 16 05/23/2024   NA 144 07/16/2024   K 5.1 07/16/2024   CL 102 07/16/2024   CREATININE 2.45 (H) 07/16/2024   BUN 53 (H) 07/16/2024   CO2 24 07/16/2024   TSH 1.80 01/10/2024   PSA 2.58 07/05/2022   INR 1.3 (H) 06/23/2022   HGBA1C 6.4 04/06/2023    No results found.  Assessment & Plan:   Problem List Items Addressed This Visit     Hypothyroidism   Chronic Cont on Levothroid      Essential hypertension - Primary    BP Readings from Last 3 Encounters:  11/14/24 (!) 142/82  11/11/24 106/71  10/17/24 122/78   Continue w/Coreg , Hydralazine , Benicar        Anxiety disorder   Cont on Lexapro       Low back pain   He is not a surgical candidate for any back surgery per Dr. Burnetta and Dr. Beuford He has had epidural injections by Dr. Laqueta Low back pain -  - severe 7-8/10 all the time. Oxycodone  - it stopped working the other day and Scott Oneal stopped taking it.  The pain is worse.  He will take Percocet 10/325 up to 4 times a day.  Potential benefits of a long term opioids use as well as potential risks (i.e. addiction risk, apnea etc) and complications (i.e. Somnolence, constipation and others) were explained to the patient and were aknowledged.  Stable - use Tramadol  prn  Potential benefits of a long term opioids use as well as potential risks (i.e. addiction risk, apnea etc) and complications (i.e. Somnolence, constipation and others)  were explained to the patient and were aknowledged.      Relevant Medications   traMADol  (ULTRAM ) 50 MG tablet   Edema   Use Calf Compression Sleeve On Demadex  20 mg/d. Ok to take 40 mg to see if better Better      Congestive heart failure (HCC)   Worse Cont on Rx - Coreg , Torsemide  On Demadex  20 mg/d. Ok to take 40 mg to see if better      Dyslipidemia   Feeling better off Lipitor - d/c         Meds ordered this encounter  Medications   traMADol  (ULTRAM ) 50 MG tablet    Sig: Take 1 tablet (50 mg total) by mouth every 6 (six) hours as needed for severe pain (pain score 7-10).    Dispense:  120 tablet    Refill:  3      Follow-up: Return in about 3 months (around 02/12/2025) for a follow-up visit.  Marolyn Noel, MD "

## 2024-11-14 NOTE — Patient Instructions (Signed)
 Try CBD gummies - [email or web address]

## 2024-11-17 ENCOUNTER — Encounter: Payer: Self-pay | Admitting: Internal Medicine

## 2024-11-28 ENCOUNTER — Ambulatory Visit: Payer: Self-pay

## 2024-11-28 ENCOUNTER — Ambulatory Visit: Admitting: Family Medicine

## 2024-11-28 ENCOUNTER — Encounter: Payer: Self-pay | Admitting: Family Medicine

## 2024-11-28 ENCOUNTER — Ambulatory Visit: Admitting: Internal Medicine

## 2024-11-28 VITALS — BP 112/62 | HR 76 | Temp 98.5°F | Ht 72.0 in | Wt 197.0 lb

## 2024-11-28 DIAGNOSIS — R059 Cough, unspecified: Secondary | ICD-10-CM | POA: Diagnosis not present

## 2024-11-28 DIAGNOSIS — R197 Diarrhea, unspecified: Secondary | ICD-10-CM | POA: Diagnosis not present

## 2024-11-28 DIAGNOSIS — J069 Acute upper respiratory infection, unspecified: Secondary | ICD-10-CM

## 2024-11-28 DIAGNOSIS — R062 Wheezing: Secondary | ICD-10-CM | POA: Diagnosis not present

## 2024-11-28 LAB — POCT RAPID STREP A (OFFICE): Rapid Strep A Screen: NEGATIVE

## 2024-11-28 LAB — POCT INFLUENZA A/B
Influenza A, POC: NEGATIVE
Influenza B, POC: NEGATIVE

## 2024-11-28 LAB — POC COVID19 BINAXNOW: SARS Coronavirus 2 Ag: NEGATIVE

## 2024-11-28 MED ORDER — PREDNISONE 10 MG PO TABS: ORAL_TABLET | ORAL | 0 refills | Status: AC

## 2024-11-28 MED ORDER — PROMETHAZINE-DM 6.25-15 MG/5ML PO SYRP: 5.0000 mL | ORAL_SOLUTION | Freq: Four times a day (QID) | ORAL | 0 refills | Status: DC | PRN

## 2024-11-28 NOTE — Progress Notes (Signed)
 "  Acute Office Visit   Subjective:  Patient ID: Scott Oneal, male    DOB: 1941/02/17, 84 y.o.   MRN: [id number]  Chief Complaint  Patient presents with   Cough   Wheezing    X3 days   Diarrhea    X3 days    HPI:  Patient is here for an acute visit. Patients primary care provider is Dr.  Karlynn Plotnikov.  Patient is having the following symptoms:  Sore throat Cough-occasional productive cough  Diarrhea  Dizziness- a little  Nasal congestion/drainage  Loss of appetite Nausea-a little bit  Wheezing   Denies headache, ear pain/drainage/pressure, chest pain, SHOB, fever, vomiting, or abd pain.   Symptoms stared about 3-4 days ago.   He has been using cough drops.   Review of Systems  Respiratory:  Positive for cough and wheezing.   Gastrointestinal:  Positive for diarrhea.   See HPI above      Objective:   BP 112/62   Pulse 76   Temp 98.5 F (36.9 C) (Oral)   Ht 6' (1.829 m)   Wt 197 lb (89.4 kg)   SpO2 99%   BMI 26.72 kg/m    Physical Exam Vitals reviewed.  Constitutional:      General: He is not in acute distress.    Appearance: Normal appearance. He is ill-appearing (Mild). He is not toxic-appearing or diaphoretic.  HENT:     Head: Normocephalic and atraumatic.     Ears:     Comments: Cerumen in both ear canals  Right hearing aid     Nose: Congestion present.     Right Sinus: No maxillary sinus tenderness or frontal sinus tenderness.     Left Sinus: No maxillary sinus tenderness or frontal sinus tenderness.     Mouth/Throat:     Pharynx: Oropharynx is clear. Uvula midline. Posterior oropharyngeal erythema (Mild) present. No pharyngeal swelling, oropharyngeal exudate or uvula swelling.  Eyes:     General:        Right eye: No discharge.        Left eye: No discharge.     Conjunctiva/sclera: Conjunctivae normal.  Cardiovascular:     Rate and Rhythm: Normal rate and regular rhythm.     Heart sounds: Normal heart sounds. No murmur heard.    No  friction rub. No gallop.  Pulmonary:     Effort: Pulmonary effort is normal. No respiratory distress.     Breath sounds: Wheezing (Left upper posterior) present.     Comments: Coughing during visit  Musculoskeletal:        General: Normal range of motion.  Lymphadenopathy:     Head:     Right side of head: No submental or submandibular adenopathy.     Left side of head: No submental or submandibular adenopathy.  Skin:    General: Skin is warm and dry.  Neurological:     General: No focal deficit present.     Mental Status: He is alert and oriented to person, place, and time. Mental status is at baseline.  Psychiatric:        Mood and Affect: Mood normal.        Behavior: Behavior normal.        Thought Content: Thought content normal.        Judgment: Judgment normal.     Results for orders placed or performed in visit on 11/28/24  POC Rapid Strep A  Result Value Ref Range   Rapid Strep  A Screen Negative Negative  POC COVID-19  Result Value Ref Range   SARS Coronavirus 2 Ag Negative Negative  POC Influenza A/B  Result Value Ref Range   Influenza A, POC Negative Negative   Influenza B, POC Negative Negative        Assessment & Plan:  Cough, unspecified type -     POCT rapid strep A -     POC COVID-19 BinaxNow -     POCT Influenza A/B -     Promethazine -DM; Take 5 mLs by mouth 4 (four) times daily as needed.  Dispense: 118 mL; Refill: 0  Viral upper respiratory tract infection -     predniSONE ; Take 6 tablets (60 mg total) by mouth daily with breakfast for 1 day, THEN 5 tablets (50 mg total) daily with breakfast for 1 day, THEN 4 tablets (40 mg total) daily with breakfast for 1 day, THEN 3 tablets (30 mg total) daily with breakfast for 1 day, THEN 2 tablets (20 mg total) daily with breakfast for 1 day, THEN 1 tablet (10 mg total) daily with breakfast for 1 day.  Dispense: 21 tablet; Refill: 0  Wheezing -     predniSONE ; Take 6 tablets (60 mg total) by mouth daily with  breakfast for 1 day, THEN 5 tablets (50 mg total) daily with breakfast for 1 day, THEN 4 tablets (40 mg total) daily with breakfast for 1 day, THEN 3 tablets (30 mg total) daily with breakfast for 1 day, THEN 2 tablets (20 mg total) daily with breakfast for 1 day, THEN 1 tablet (10 mg total) daily with breakfast for 1 day.  Dispense: 21 tablet; Refill: 0  Diarrhea, unspecified type  -Rapid strep, covid, and influenza are negative.  -Upper respiratory viral illness with diarrhea. -Discontinue daily Prednisone  10mg  tablet and start taking Prednisone  10mg , 6 day taper dose pak, then resume back with Prednisone  10mg  daily. -Prescribed Promethazine -DM cough syrup, 5mL (1 teaspoon) every 6 hours as needed for cough. Caution: Medication can cause drowsiness.  -May use over the counter Imodium for diarrhea. -Rest, hydrate.  -Discussed about reasons for prompt return for a follow up.   Kayanna Mckillop, NP "

## 2024-11-28 NOTE — Telephone Encounter (Signed)
 FYI Only or Action Required?: FYI only for provider: appointment scheduled on 11/28/24 a [facility].  Patient was last seen in primary care on 11/14/2024 by Plotnikov, Karlynn GAILS, MD.  Called Nurse Triage reporting Wheezing.  Symptoms began several days ago.  Interventions attempted: Nothing.  Symptoms are: gradually worsening.  Triage Disposition: See HCP Within 4 Hours (Or PCP Triage)  Patient/caregiver understands and will follow disposition?: Yes   Reason for Disposition  Wheezing is present  Answer Assessment - Initial Assessment Questions Scott Oneal, home health nurse calling in today to report pt symptoms and ask for sooner appointment than 2/4. Scheduled patient today at [facility], patient also asked to keep his appointment with PCP on 2/4  1. ONSET: When did the cough begin?      2-3 days ago  3. SPUTUM: Describe the color of your sputum (e.g., none, dry cough; clear, white, yellow, green)     Can't get anything up  4. HEMOPTYSIS: Are you coughing up any blood? If Yes, ask: How much? (e.g., flecks, streaks, tablespoons, etc.)     Denies  5. DIFFICULTY BREATHING: Are you having difficulty breathing? If Yes, ask: How bad is it? (e.g., mild, moderate, severe)      Denies  6. FEVER: Do you have a fever? If Yes, ask: What is your temperature, how was it measured, and when did it start?     Denies, 98.4 tympanic  7. OTHER SYMPTOMS: Do you have any other symptoms? (e.g., runny nose, wheezing, chest pain)       Wheezing,  Protocols used: Cough - Acute Non-Productive-A-AH  Message from Hadassah PARAS sent at 11/28/2024 10:15 AM EST  Reason for Triage: Scott Oneal w [facility] is calling to check if we have earlier availability. Pt is experiencing wheezing,fluid in lungs, bad deep cough. Also has diarrhea. Transferring to NT

## 2024-11-28 NOTE — Patient Instructions (Addendum)
-  It was a pleasure to care for you today.  -Rapid strep, covid, and influenza are negative.  -Upper respiratory viral illness with diarrhea. -Discontinue daily Prednisone  10mg  tablet and start taking Prednisone  10mg , 6 day taper dose pak, then resume back with Prednisone  10mg  daily. -Prescribed Promethazine -DM cough syrup, 5mL (1 teaspoon) every 6 hours as needed for cough. Caution: Medication can cause drowsiness.  -May use over the counter Imodium for diarrhea. -Rest, hydrate.  -Discussed about reasons for prompt return for a follow up.

## 2024-11-30 ENCOUNTER — Emergency Department (HOSPITAL_COMMUNITY)

## 2024-11-30 ENCOUNTER — Other Ambulatory Visit: Payer: Self-pay

## 2024-11-30 ENCOUNTER — Observation Stay (HOSPITAL_COMMUNITY)
Admission: EM | Admit: 2024-11-30 | Source: Home / Self Care | Attending: Internal Medicine | Admitting: Internal Medicine

## 2024-11-30 ENCOUNTER — Other Ambulatory Visit (HOSPITAL_COMMUNITY): Payer: Self-pay

## 2024-11-30 DIAGNOSIS — E039 Hypothyroidism, unspecified: Secondary | ICD-10-CM | POA: Diagnosis present

## 2024-11-30 DIAGNOSIS — D696 Thrombocytopenia, unspecified: Secondary | ICD-10-CM | POA: Diagnosis present

## 2024-11-30 DIAGNOSIS — E034 Atrophy of thyroid (acquired): Secondary | ICD-10-CM

## 2024-11-30 DIAGNOSIS — K8689 Other specified diseases of pancreas: Secondary | ICD-10-CM

## 2024-11-30 DIAGNOSIS — K219 Gastro-esophageal reflux disease without esophagitis: Secondary | ICD-10-CM

## 2024-11-30 DIAGNOSIS — I4892 Unspecified atrial flutter: Principal | ICD-10-CM | POA: Diagnosis present

## 2024-11-30 DIAGNOSIS — W19XXXA Unspecified fall, initial encounter: Secondary | ICD-10-CM

## 2024-11-30 DIAGNOSIS — R410 Disorientation, unspecified: Secondary | ICD-10-CM

## 2024-11-30 DIAGNOSIS — N184 Chronic kidney disease, stage 4 (severe): Secondary | ICD-10-CM | POA: Diagnosis present

## 2024-11-30 DIAGNOSIS — Y92009 Unspecified place in unspecified non-institutional (private) residence as the place of occurrence of the external cause: Secondary | ICD-10-CM

## 2024-11-30 DIAGNOSIS — I5030 Unspecified diastolic (congestive) heart failure: Secondary | ICD-10-CM | POA: Diagnosis present

## 2024-11-30 DIAGNOSIS — E875 Hyperkalemia: Secondary | ICD-10-CM | POA: Diagnosis present

## 2024-11-30 DIAGNOSIS — J45909 Unspecified asthma, uncomplicated: Secondary | ICD-10-CM | POA: Diagnosis present

## 2024-11-30 DIAGNOSIS — I5032 Chronic diastolic (congestive) heart failure: Secondary | ICD-10-CM

## 2024-11-30 DIAGNOSIS — J4520 Mild intermittent asthma, uncomplicated: Secondary | ICD-10-CM

## 2024-11-30 LAB — CBC WITH DIFFERENTIAL/PLATELET
Abs Immature Granulocytes: 0.17 10*3/uL — ABNORMAL HIGH (ref 0.00–0.07)
Basophils Absolute: 0 10*3/uL (ref 0.0–0.1)
Basophils Relative: 0 %
Eosinophils Absolute: 0 10*3/uL (ref 0.0–0.5)
Eosinophils Relative: 0 %
HCT: 42.2 % (ref 39.0–52.0)
Hemoglobin: 13 g/dL (ref 13.0–17.0)
Immature Granulocytes: 1 %
Lymphocytes Relative: 5 %
Lymphs Abs: 0.6 10*3/uL — ABNORMAL LOW (ref 0.7–4.0)
MCH: 26.7 pg (ref 26.0–34.0)
MCHC: 30.8 g/dL (ref 30.0–36.0)
MCV: 86.8 fL (ref 80.0–100.0)
Monocytes Absolute: 0.4 10*3/uL (ref 0.1–1.0)
Monocytes Relative: 3 %
Neutro Abs: 10.9 10*3/uL — ABNORMAL HIGH (ref 1.7–7.7)
Neutrophils Relative %: 91 %
Platelets: 47 10*3/uL — ABNORMAL LOW (ref 150–400)
RBC: 4.86 MIL/uL (ref 4.22–5.81)
RDW: 18.5 % — ABNORMAL HIGH (ref 11.5–15.5)
WBC: 12 10*3/uL — ABNORMAL HIGH (ref 4.0–10.5)
nRBC: 0.7 % — ABNORMAL HIGH (ref 0.0–0.2)

## 2024-11-30 LAB — COMPREHENSIVE METABOLIC PANEL WITH GFR
ALT: 26 U/L (ref 0–44)
AST: 27 U/L (ref 15–41)
Albumin: 3.2 g/dL — ABNORMAL LOW (ref 3.5–5.0)
Alkaline Phosphatase: 62 U/L (ref 38–126)
Anion gap: 12 (ref 5–15)
BUN: 86 mg/dL — ABNORMAL HIGH (ref 8–23)
CO2: 26 mmol/L (ref 22–32)
Calcium: 8.4 mg/dL — ABNORMAL LOW (ref 8.9–10.3)
Chloride: 104 mmol/L (ref 98–111)
Creatinine, Ser: 2.69 mg/dL — ABNORMAL HIGH (ref 0.61–1.24)
GFR, Estimated: 23 mL/min — ABNORMAL LOW
Glucose, Bld: 95 mg/dL (ref 70–99)
Potassium: 5.2 mmol/L — ABNORMAL HIGH (ref 3.5–5.1)
Sodium: 142 mmol/L (ref 135–145)
Total Bilirubin: 1 mg/dL (ref 0.0–1.2)
Total Protein: 5.3 g/dL — ABNORMAL LOW (ref 6.5–8.1)

## 2024-11-30 LAB — CBC
HCT: 46.2 % (ref 39.0–52.0)
Hemoglobin: 14.3 g/dL (ref 13.0–17.0)
MCH: 26.9 pg (ref 26.0–34.0)
MCHC: 31 g/dL (ref 30.0–36.0)
MCV: 86.8 fL (ref 80.0–100.0)
Platelets: 46 10*3/uL — ABNORMAL LOW (ref 150–400)
RBC: 5.32 MIL/uL (ref 4.22–5.81)
RDW: 18.5 % — ABNORMAL HIGH (ref 11.5–15.5)
WBC: 9.6 10*3/uL (ref 4.0–10.5)
nRBC: 0.8 % — ABNORMAL HIGH (ref 0.0–0.2)

## 2024-11-30 LAB — PROTIME-INR
INR: 1.1 (ref 0.8–1.2)
Prothrombin Time: 15.2 s (ref 11.4–15.2)

## 2024-11-30 LAB — TSH: TSH: <0.1 u[IU]/mL — ABNORMAL LOW (ref 0.350–4.500)

## 2024-11-30 LAB — MAGNESIUM: Magnesium: 2.4 mg/dL (ref 1.7–2.4)

## 2024-11-30 MED ORDER — ACETAMINOPHEN 325 MG PO TABS: 650.0000 mg | ORAL_TABLET | Freq: Four times a day (QID) | ORAL | Status: AC | PRN

## 2024-11-30 MED ORDER — POLYVINYL ALCOHOL 1.4 % OP SOLN: 1.0000 [drp] | Freq: Three times a day (TID) | OPHTHALMIC | Status: AC | PRN

## 2024-11-30 MED ORDER — DILTIAZEM HCL-DEXTROSE 125-5 MG/125ML-% IV SOLN (PREMIX)
5.0000 mg/h | INTRAVENOUS | Status: DC
  Filled 2024-11-30: qty 125

## 2024-11-30 MED ORDER — APIXABAN 5 MG PO TABS: 5.0000 mg | ORAL_TABLET | Freq: Two times a day (BID) | ORAL | Status: DC

## 2024-11-30 MED ORDER — MUPIROCIN 2 % EX OINT
1.0000 | TOPICAL_OINTMENT | Freq: Every day | CUTANEOUS | Status: DC
  Administered 2024-12-02 – 2024-12-03 (×2): 1 via TOPICAL
  Filled 2024-11-30: qty 22

## 2024-11-30 MED ORDER — OLANZAPINE 10 MG PO TABS
10.0000 mg | ORAL_TABLET | Freq: Once | ORAL | Status: AC
  Administered 2024-11-30: 10 mg via ORAL
  Filled 2024-11-30 (×2): qty 1

## 2024-11-30 MED ORDER — TORSEMIDE 20 MG PO TABS: 40.0000 mg | ORAL_TABLET | Freq: Every day | ORAL | Status: DC

## 2024-11-30 MED ORDER — ACETAMINOPHEN 650 MG RE SUPP: 650.0000 mg | Freq: Four times a day (QID) | RECTAL | Status: AC | PRN

## 2024-11-30 MED ORDER — PANCRELIPASE (LIP-PROT-AMYL) 36000-114000 UNITS PO CPEP
72000.0000 [IU] | ORAL_CAPSULE | Freq: Three times a day (TID) | ORAL | Status: AC
  Administered 2024-12-01 – 2024-12-06 (×11): 72000 [IU] via ORAL
  Filled 2024-11-30 (×19): qty 2

## 2024-11-30 MED ORDER — CALCITRIOL 0.25 MCG PO CAPS
0.2500 ug | ORAL_CAPSULE | Freq: Every morning | ORAL | Status: AC
  Administered 2024-12-01 – 2024-12-06 (×6): 0.25 ug via ORAL
  Filled 2024-11-30 (×6): qty 1

## 2024-11-30 MED ORDER — APIXABAN 2.5 MG PO TABS
2.5000 mg | ORAL_TABLET | Freq: Two times a day (BID) | ORAL | Status: DC
  Administered 2024-11-30 (×2): 2.5 mg via ORAL
  Filled 2024-11-30 (×2): qty 1

## 2024-11-30 MED ORDER — LEVOTHYROXINE SODIUM 75 MCG PO TABS
175.0000 ug | ORAL_TABLET | Freq: Every day | ORAL | Status: AC
  Administered 2024-11-30 – 2024-12-06 (×7): 175 ug via ORAL
  Filled 2024-11-30 (×7): qty 1

## 2024-11-30 MED ORDER — ETOMIDATE 2 MG/ML IV SOLN
10.0000 mg | Freq: Once | INTRAVENOUS | Status: AC
  Administered 2024-11-30: 10 mg via INTRAVENOUS
  Filled 2024-11-30: qty 10

## 2024-11-30 MED ORDER — PREDNISONE 20 MG PO TABS
40.0000 mg | ORAL_TABLET | Freq: Every day | ORAL | Status: DC
  Administered 2024-11-30: 40 mg via ORAL
  Filled 2024-11-30: qty 2

## 2024-11-30 MED ORDER — ALBUTEROL SULFATE (2.5 MG/3ML) 0.083% IN NEBU
2.5000 mg | INHALATION_SOLUTION | Freq: Two times a day (BID) | RESPIRATORY_TRACT | Status: DC
  Administered 2024-12-01 – 2024-12-02 (×4): 2.5 mg via RESPIRATORY_TRACT
  Filled 2024-11-30 (×4): qty 3

## 2024-11-30 MED ORDER — SODIUM CHLORIDE 0.9% FLUSH
3.0000 mL | Freq: Two times a day (BID) | INTRAVENOUS | Status: AC
  Administered 2024-11-30 – 2024-12-06 (×11): 3 mL via INTRAVENOUS

## 2024-11-30 MED ORDER — ALBUTEROL SULFATE (2.5 MG/3ML) 0.083% IN NEBU: 2.5000 mg | INHALATION_SOLUTION | RESPIRATORY_TRACT | Status: AC | PRN

## 2024-11-30 MED ORDER — PANTOPRAZOLE SODIUM 40 MG PO TBEC
40.0000 mg | DELAYED_RELEASE_TABLET | Freq: Two times a day (BID) | ORAL | Status: AC
  Administered 2024-12-01 – 2024-12-06 (×12): 40 mg via ORAL
  Filled 2024-11-30 (×12): qty 1

## 2024-11-30 MED ORDER — SODIUM CHLORIDE 0.9 % IV BOLUS
500.0000 mL | Freq: Once | INTRAVENOUS | Status: AC
  Administered 2024-11-30: 500 mL via INTRAVENOUS

## 2024-11-30 MED ORDER — TORSEMIDE 20 MG PO TABS
40.0000 mg | ORAL_TABLET | Freq: Every day | ORAL | Status: AC
  Administered 2024-11-30 – 2024-12-06 (×7): 40 mg via ORAL
  Filled 2024-11-30 (×7): qty 2

## 2024-11-30 MED ORDER — DILTIAZEM HCL 25 MG/5ML IV SOLN
20.0000 mg | Freq: Once | INTRAVENOUS | Status: AC
  Administered 2024-11-30: 20 mg via INTRAVENOUS
  Filled 2024-11-30: qty 5

## 2024-11-30 MED ORDER — GABAPENTIN 300 MG PO CAPS
300.0000 mg | ORAL_CAPSULE | Freq: Two times a day (BID) | ORAL | Status: AC
  Administered 2024-12-01 – 2024-12-06 (×12): 300 mg via ORAL
  Filled 2024-11-30 (×12): qty 1

## 2024-11-30 MED ORDER — DILTIAZEM HCL 60 MG PO TABS
60.0000 mg | ORAL_TABLET | Freq: Three times a day (TID) | ORAL | Status: DC
  Administered 2024-11-30 – 2024-12-01 (×3): 60 mg via ORAL
  Filled 2024-11-30 (×3): qty 1

## 2024-11-30 NOTE — ED Notes (Signed)
 ED Provider at bedside.

## 2024-11-30 NOTE — ED Notes (Signed)
 Patient transported to CT

## 2024-11-30 NOTE — ED Provider Notes (Signed)
 Received patient in turnover from Dr. Theadore.  Please see their note for further details of Hx, PE.  Briefly patient is a 84 y.o. male with a Loss of Consciousness .  Patient with syncope.  Found to be in atrial flutter.  Cardioverted.  Reassess.  Patient back in atrial flutter with variable block.  Having some trouble waking up from sedation as well.  Awaiting CT imaging of the head.  CT head and C spine without acute intracranial or intraspinal pathology.  Will discuss with medicine for admission.  I discussed case with Dr. Kennyth, cardiology he recommends Eliquis  for anticoagulation.  Agrees to see the patient at bedside.    Emil Share, DO 11/30/24 1055

## 2024-11-30 NOTE — ED Notes (Signed)
 EDP notified of pt desat/periods of apnea. Pt oriented x4.

## 2024-11-30 NOTE — Discharge Instructions (Signed)
 Information on my medicine - ELIQUIS  (apixaban )  This medication education was reviewed with me or my healthcare representative as part of my discharge preparation.  The pharmacist that spoke with me during my hospital stay was:  Elma Fail, [facility]  Why was Eliquis  prescribed for you? Eliquis  was prescribed for you to reduce the risk of forming blood clots that can cause a stroke if you have a medical condition called atrial fibrillation (a type of irregular heartbeat) OR to reduce the risk of a blood clots forming after orthopedic surgery.  What do You need to know about Eliquis  ? Take your Eliquis  TWICE DAILY - one tablet in the morning and one tablet in the evening with or without food.  It would be best to take the doses about the same time each day.  If you have difficulty swallowing the tablet whole please discuss with your pharmacist how to take the medication safely.  Take Eliquis  exactly as prescribed by your doctor and DO NOT stop taking Eliquis  without talking to the doctor who prescribed the medication.  Stopping may increase your risk of developing a new clot or stroke.  Refill your prescription before you run out.  After discharge, you should have regular check-up appointments with your healthcare provider that is prescribing your Eliquis .  In the future your dose may need to be changed if your kidney function or weight changes by a significant amount or as you get older.  What do you do if you miss a dose? If you miss a dose, take it as soon as you remember on the same day and resume taking twice daily.  Do not take more than one dose of ELIQUIS  at the same time.  Important Safety Information A possible side effect of Eliquis  is bleeding. You should call your healthcare provider right away if you experience any of the following: Bleeding from an injury or your nose that does not stop. Unusual colored urine (red or dark brown) or unusual colored stools (red or  black). Unusual bruising for unknown reasons. A serious fall or if you hit your head (even if there is no bleeding).  Some medicines may interact with Eliquis  and might increase your risk of bleeding or clotting while on Eliquis . To help avoid this, consult your healthcare provider or pharmacist prior to using any new prescription or non-prescription medications, including herbals, vitamins, non-steroidal anti-inflammatory drugs (NSAIDs) and supplements.  This website has more information on Eliquis  (apixaban ): http://[email or web address] .com/eliquis dena

## 2024-11-30 NOTE — ED Notes (Signed)
 Hold dilt gtt per Dr. Theadore. Cardioversion to begin shortly.

## 2024-11-30 NOTE — ED Provider Notes (Signed)
 " [facility] Emergency Department Provider Note MRN:  [id number]  Arrival date & time: 11/30/24     Chief Complaint   Loss of Consciousness   History of Present Illness   Scott Oneal is a 84 y.o. year-old male with a history of CKD presenting to the ED with chief complaint of loss of consciousness.  Stood up at home and got dizzy and passed out and fell to the ground.  Head trauma, skin tears to the elbows.  Heart rate has been quite elevated ever since waking up from the syncopal event.  Reportedly in the 160s with EMS not responding to adenosine.  Patient denies chest pain or shortness of breath, feels a bit sore from the fall but otherwise no complaints.  Review of Systems  A thorough review of systems was obtained and all systems are negative except as noted in the HPI and PMH.   Patient's Health History    Past Medical History:  Diagnosis Date   Allergy     Arthritis    Calculus of gallbladder without mention of cholecystitis    Cataract    bilateral cateracts removed   Celiac disease    Chronic kidney disease    stage 3   Coronary atherosclerosis of unspecified type of vessel, native or graft    Dermatitis 06/08/2021   Erythema of lower extremity 09/08/2021   Esophageal reflux    Glaucoma    History of cellulitis 09/08/2021   History of kidney stones    Loss of weight    Lower extremity weakness 05/31/2021   Macular degeneration    Rt eye . injections   Old myocardial infarction    Osteoporosis, unspecified    Other malaise and fatigue    Other specified cardiac dysrhythmias(427.89)    Other testicular hypofunction    Pancreatic insufficiency 05/31/2021   Peripheral vascular disease    Personal history of colonic polyps    Postural dizziness with presyncope 12/13/2021   Pure hypercholesterolemia    Thrombocytopenia    chronic, per PCP notes dating back to 2009   Tinea pedis 05/31/2021   Unspecified asthma(493.90)    Unspecified  essential hypertension    Unspecified hypothyroidism    Venous stasis dermatitis 09/08/2021    Past Surgical History:  Procedure Laterality Date   ARTERY BIOPSY Left 02/18/2022   Procedure: LEFT TEMPORAL ARTERY BIOPSY;  Surgeon: Stevie Herlene Righter, MD;  Location: [facility];  Service: General;  Laterality: Left;   cataract surgery Bilateral    COLONOSCOPY  06/26/2017   Scott Oneal   COLONOSCOPY WITH PROPOFOL  N/A 11/27/2014   Procedure: COLONOSCOPY WITH PROPOFOL ;  Surgeon: Toribio SHAUNNA Cedar, MD;  Location: [facility] ENDOSCOPY;  Service: Endoscopy;  Laterality: N/A;   CORONARY ANGIOPLASTY     CORONARY STENT PLACEMENT  2011   Cypher; in distal circumflex artery   CYSTOSCOPY  1998   IR URETERAL STENT LEFT NEW ACCESS W/O SEP NEPHROSTOMY CATH  06/21/2021   KNEE SURGERY     right   LEFT HEART CATH AND CORONARY ANGIOGRAPHY N/A 01/31/2020   Procedure: LEFT HEART CATH AND CORONARY ANGIOGRAPHY;  Surgeon: Claudene Victory LELON, MD;  Location: [facility];  Service: Cardiovascular;  Laterality: N/A;   NEPHROLITHOTOMY Left 06/21/2021   Procedure: NEPHROLITHOTOMY PERCUTANEOUS;  Surgeon: Matilda Senior, MD;  Location: [facility];  Service: Urology;  Laterality: Left;  90 MINS   PERIPHERAL VASCULAR THROMBECTOMY Left 06/24/2022   Procedure: PERIPHERAL VASCULAR THROMBECTOMY;  Surgeon: Magda Debby SAILOR, MD;  Location[facility];  Service: Cardiovascular;  Laterality: Left;   SHOULDER SURGERY Left 07/2013   Dr Graylon   UPPER GASTROINTESTINAL ENDOSCOPY      Family History  Problem Relation Age of Onset   Lymphoma Father    Hypertension Other    Vision loss Maternal Grandmother    Colon cancer Son 58   Asthma Neg Hx    Esophageal cancer Neg Hx    Rectal cancer Neg Hx    Stomach cancer Neg Hx    Colon polyps Neg Hx     Social History   Socioeconomic History   Marital status: Married    Spouse name: Draken Farrior   Number of children: 3   Years of education: Not on file   Highest education level: Not  on file  Occupational History   Occupation: retired    Associate Professor: RETIRED    Comment: Engineering  Tobacco Use   Smoking status: Never    Passive exposure: Never   Smokeless tobacco: Never  Vaping Use   Vaping status: Never Used  Substance and Sexual Activity   Alcohol  use: No   Drug use: No   Sexual activity: Yes  Other Topics Concern   Not on file  Social History Narrative   Retired.   Regular Exercise-Yes; yoga & silver sneakers   Daily Caffeine Use.            Social Drivers of Health   Tobacco Use: Low Risk (11/28/2024)   Patient History    Smoking Tobacco Use: Never    Smokeless Tobacco Use: Never    Passive Exposure: Never  Financial Resource Strain: Low Risk (01/25/2024)   Overall Financial Resource Strain (CARDIA)    Difficulty of Paying Living Expenses: Not hard at all  Food Insecurity: No Food Insecurity (10/15/2024)   Epic    Worried About Programme Researcher, Broadcasting/film/video in the Last Year: Never true    Ran Out of Food in the Last Year: Never true  Transportation Needs: No Transportation Needs (10/15/2024)   Epic    Lack of Transportation (Medical): No    Lack of Transportation (Non-Medical): No  Physical Activity: Inactive (01/25/2024)   Exercise Vital Sign    Days of Exercise per Week: 0 days    Minutes of Exercise per Session: 0 min  Stress: No Stress Concern Present (01/25/2024)   [employer] of Occupational Health - Occupational Stress Questionnaire    Feeling of Stress : Not at all  Social Connections: Moderately Isolated (01/25/2024)   Social Connection and Isolation Panel    Frequency of Communication with Friends and Family: More than three times a week    Frequency of Social Gatherings with Friends and Family: Not on file    Attends Religious Services: Never    Active Member of Clubs or Organizations: No    Attends Banker Meetings: Never    Marital Status: Married  Catering Manager Violence: Not At Risk (10/15/2024)   Epic    Fear of  Current or Ex-Partner: No    Emotionally Abused: No    Physically Abused: No    Sexually Abused: No  Depression (PHQ2-9): Low Risk (11/28/2024)   Depression (PHQ2-9)    PHQ-2 Score: 0  Alcohol  Screen: Low Risk (01/25/2024)   Alcohol  Screen    Last Alcohol  Screening Score (AUDIT): 0  Housing: Unknown (10/15/2024)   Epic    Unable to Pay for Housing in the Last Year: No  Number of Times Moved in the Last Year: Not on file    Homeless in the Last Year: No  Utilities: Not At Risk (10/15/2024)   Epic    Threatened with loss of utilities: No  Health Literacy: Adequate Health Literacy (01/25/2024)   B1300 Health Literacy    Frequency of need for help with medical instructions: Never     Physical Exam   Vitals:   11/30/24 0800 11/30/24 0805  BP: 138/75 134/82  Pulse:  67  Resp: 13 13  Temp:    SpO2: 97% 97%    CONSTITUTIONAL: Well-appearing, NAD NEURO/PSYCH:  Alert and oriented x 3, no focal deficits EYES:  eyes equal and reactive ENT/NECK:  no LAD, no JVD CARDIO: Tachycardic rate, well-perfused, normal S1 and S2 PULM:  CTAB no wheezing or rhonchi GI/GU:  non-distended, non-tender MSK/SPINE:  No gross deformities, no edema SKIN:  no rash, atraumatic   *Additional and/or pertinent findings included in MDM below  Diagnostic and Interventional Summary    EKG Interpretation Date/Time:  Saturday November 30 2024 07:19:44 EST Ventricular Rate:  121 PR Interval:    QRS Duration:  102 QT Interval:  337 QTC Calculation: 456 R Axis:   18  Text Interpretation: Atrial flutter Aberrant conduction of SV complex(es) Inferior infarct, old Lateral leads are also involved Confirmed by Theadore Sharper [phone]) on 11/30/2024 8:14:57 AM       Labs Reviewed  CBC - Abnormal; Notable for the following components:      Result Value   RDW 18.5 (*)    Platelets 46 (*)    nRBC 0.8 (*)    All other components within normal limits  COMPREHENSIVE METABOLIC PANEL WITH GFR - Abnormal; Notable  for the following components:   Potassium 5.2 (*)    BUN 86 (*)    Creatinine, Ser 2.69 (*)    Calcium  8.4 (*)    Total Protein 5.3 (*)    Albumin 3.2 (*)    GFR, Estimated 23 (*)    All other components within normal limits  MAGNESIUM  PROTIME-INR    CT HEAD WO CONTRAST ( )    (Results Pending)  CT CERVICAL SPINE WO CONTRAST    (Results Pending)  DG Chest Port 1 View    (Results Pending)    Medications  diltiazem  (CARDIZEM ) 125 mg in dextrose  5% 125 mL (1 mg/mL) infusion (0 mg/hr Intravenous Hold 11/30/24 0726)  sodium chloride  0.9 % bolus 500 mL (500 mLs Intravenous New Bag/Given 11/30/24 0700)  diltiazem  (CARDIZEM ) injection 20 mg (20 mg Intravenous Given 11/30/24 0651)  etomidate  (AMIDATE ) injection 10 mg (10 mg Intravenous Given 11/30/24 0746)     Procedures  /  Critical Care .Critical Care  Performed by: Theadore Sharper HERO, MD Authorized by: Theadore Sharper HERO, MD   Critical care provider statement:    Critical care time (minutes):  45   Critical care was necessary to treat or prevent imminent or life-threatening deterioration of the following conditions: Atrial flutter with RVR.   Critical care was time spent personally by me on the following activities:  Development of treatment plan with patient or surrogate, discussions with consultants, evaluation of patient's response to treatment, examination of patient, ordering and review of laboratory studies, ordering and review of radiographic studies, ordering and performing treatments and interventions, pulse oximetry, re-evaluation of patient's condition and review of old charts   ED Course and Medical Decision Making  Initial Impression and Ddx Patient appears to be in new onset  atrial flutter with rate of about 160.  Not responding to adenosine.  May end up needing cardioversion, will trial a dose of diltiazem .  Not having any chest pain or shortness of breath, sitting comfortably, alert and oriented.  Does have evidence of head  trauma.  Awaiting imaging.  Past medical/surgical history that increases complexity of ED encounter: CKD  Interpretation of Diagnostics I personally reviewed the EKG and my interpretation is as follows: Suspect 2-1 atrial flutter, rapid ventricular response  No significant blood count or electrolyte disturbance.  Patient Reassessment and Ultimate Disposition/Management     Patient recovering after cardioversion.  The single dose of 20 mg diltiazem  improved patient's rate, became a variable conduction flutter with rates between 100 and 140 but still clearly with flutter waves.  Cardioversion as described above, transitioned to sinus rhythm with frequent PACs after the cardioversion attempt.  Will monitor here in the emergency department, if continues to have ectopy would be appropriate to admit for telemetry given the syncope.  Still awaiting some labs, imaging.  Signed out to oncoming provider at shift change.  Patient management required discussion with the following services or consulting groups:  Hospitalist Service  Complexity of Problems Addressed Acute illness or injury that poses threat of life of bodily function  Additional Data Reviewed and Analyzed Further history obtained from: EMS on arrival and Prior labs/imaging results  Additional Factors Impacting ED Encounter Risk Consideration of hospitalization  Ozell HERO. Theadore, MD [facility] Emergency Medicine [facility] [email or web address]  Final Clinical Impressions(s) / ED Diagnoses     ICD-10-CM   1. Atrial flutter with rapid ventricular response (HCC)  I48.92       ED Discharge Orders     None        Discharge Instructions Discussed with and Provided to Patient:   Discharge Instructions   None      Theadore Ozell HERO, MD 11/30/24 [id number]  "

## 2024-11-30 NOTE — Sedation Documentation (Signed)
 120J

## 2024-11-30 NOTE — Progress Notes (Addendum)
 "  Electrophysiology Consultation   Patient ID: Scott Oneal MRN: [id number]; DOB: [birth year]  Admit date: 11/30/2024 Date of Consult: 11/30/2024  PCP:  Garald Karlynn GAILS, MD   [facility] Providers Cardiologist:  Ozell Fell, MD        HPI: Scott Oneal is a 84 y.o. male with a hx of DVT, CAD status post PCI to circumflex, chronic systolic heart failure, hypertension, hyperlipidemia, frequent PACs and atrial tachycardia who is being seen 11/30/2024 for the evaluation of atrial flutter.  Patient presented to the ED following a syncopal event at home.  He is found to be in atrial flutter with rapid rates.  He underwent cardioversion.  Post cardioversion, he reports feeling relatively well.  He has no new or acute complaints at this time.  He denies palpitations.  Of note, he recently started high-dose steroids for possible acute bronchitis/asthma exacerbation.  Past Medical History:  Diagnosis Date   Allergy     Arthritis    Calculus of gallbladder without mention of cholecystitis    Cataract    bilateral cateracts removed   Celiac disease    Chronic kidney disease    stage 3   Coronary atherosclerosis of unspecified type of vessel, native or graft    Dermatitis 06/08/2021   Erythema of lower extremity 09/08/2021   Esophageal reflux    Glaucoma    History of cellulitis 09/08/2021   History of kidney stones    Loss of weight    Lower extremity weakness 05/31/2021   Macular degeneration    Rt eye . injections   Old myocardial infarction    Osteoporosis, unspecified    Other malaise and fatigue    Other specified cardiac dysrhythmias(427.89)    Other testicular hypofunction    Pancreatic insufficiency 05/31/2021   Peripheral vascular disease    Personal history of colonic polyps    Postural dizziness with presyncope 12/13/2021   Pure hypercholesterolemia    Thrombocytopenia    chronic, per PCP notes dating back to 2009   Tinea pedis 05/31/2021    Unspecified asthma(493.90)    Unspecified essential hypertension    Unspecified hypothyroidism    Venous stasis dermatitis 09/08/2021    Past Surgical History:  Procedure Laterality Date   ARTERY BIOPSY Left 02/18/2022   Procedure: LEFT TEMPORAL ARTERY BIOPSY;  Surgeon: Stevie Herlene Righter, MD;  Location: [facility];  Service: General;  Laterality: Left;   cataract surgery Bilateral    COLONOSCOPY  06/26/2017   [facility]   COLONOSCOPY WITH PROPOFOL  N/A 11/27/2014   Procedure: COLONOSCOPY WITH PROPOFOL ;  Surgeon: Toribio SHAUNNA Cedar, MD;  Location: [facility];  Service: Endoscopy;  Laterality: N/A;   CORONARY ANGIOPLASTY     CORONARY STENT PLACEMENT  2011   Cypher; in distal circumflex artery   CYSTOSCOPY  1998   IR URETERAL STENT LEFT NEW ACCESS W/O SEP NEPHROSTOMY CATH  06/21/2021   KNEE SURGERY     right   LEFT HEART CATH AND CORONARY ANGIOGRAPHY N/A 01/31/2020   Procedure: LEFT HEART CATH AND CORONARY ANGIOGRAPHY;  Surgeon: Claudene Victory LELON, MD;  Location: [facility];  Service: Cardiovascular;  Laterality: N/A;   NEPHROLITHOTOMY Left 06/21/2021   Procedure: NEPHROLITHOTOMY PERCUTANEOUS;  Surgeon: Matilda Senior, MD;  Location: [facility];  Service: Urology;  Laterality: Left;  90 MINS   PERIPHERAL VASCULAR THROMBECTOMY Left 06/24/2022   Procedure: PERIPHERAL VASCULAR THROMBECTOMY;  Surgeon: Magda Debby SAILOR, MD;  Location: [facility];  Service: Cardiovascular;  Laterality: Left;   SHOULDER SURGERY Left 07/2013   Dr Graylon   UPPER GASTROINTESTINAL ENDOSCOPY       Scheduled Meds:  albuterol   2.5 mg Nebulization BID   apixaban   2.5 mg Oral BID   levothyroxine   175 mcg Oral Daily   sodium chloride  flush  3 mL Intravenous Q12H   Continuous Infusions:  diltiazem  (CARDIZEM ) infusion Stopped (11/30/24 0726)   PRN Meds: acetaminophen  **OR** acetaminophen , albuterol   Allergies:   Allergies[1]  Social History:   Social History   Socioeconomic History   Marital status:  Married    Spouse name: Elizeo Rodriques   Number of children: 3   Years of education: Not on file   Highest education level: Not on file  Occupational History   Occupation: retired    Associate Professor: RETIRED    Comment: Engineering  Tobacco Use   Smoking status: Never    Passive exposure: Never   Smokeless tobacco: Never  Vaping Use   Vaping status: Never Used  Substance and Sexual Activity   Alcohol  use: No   Drug use: No   Sexual activity: Yes  Other Topics Concern   Not on file  Social History Narrative   Retired.   Regular Exercise-Yes; yoga & silver sneakers   Daily Caffeine Use.            Social Drivers of Health   Tobacco Use: Low Risk (11/28/2024)   Patient History    Smoking Tobacco Use: Never    Smokeless Tobacco Use: Never    Passive Exposure: Never  Financial Resource Strain: Low Risk (01/25/2024)   Overall Financial Resource Strain (CARDIA)    Difficulty of Paying Living Expenses: Not hard at all  Food Insecurity: No Food Insecurity (11/30/2024)   Epic    Worried About Programme Researcher, Broadcasting/film/video in the Last Year: Never true    Ran Out of Food in the Last Year: Never true  Transportation Needs: No Transportation Needs (11/30/2024)   Epic    Lack of Transportation (Medical): No    Lack of Transportation (Non-Medical): No  Physical Activity: Inactive (01/25/2024)   Exercise Vital Sign    Days of Exercise per Week: 0 days    Minutes of Exercise per Session: 0 min  Stress: No Stress Concern Present (01/25/2024)   [employer] of Occupational Health - Occupational Stress Questionnaire    Feeling of Stress : Not at all  Social Connections: Moderately Isolated (11/30/2024)   Social Connection and Isolation Panel    Frequency of Communication with Friends and Family: More than three times a week    Frequency of Social Gatherings with Friends and Family: More than three times a week    Attends Religious Services: Never    Database Administrator or Organizations: No     Attends Banker Meetings: Never    Marital Status: Married  Catering Manager Violence: Not At Risk (11/30/2024)   Epic    Fear of Current or Ex-Partner: No    Emotionally Abused: No    Physically Abused: No    Sexually Abused: No  Depression (PHQ2-9): Low Risk (11/28/2024)   Depression (PHQ2-9)    PHQ-2 Score: 0  Alcohol  Screen: Low Risk (01/25/2024)   Alcohol  Screen    Last Alcohol  Screening Score (AUDIT): 0  Housing: Low Risk (11/30/2024)   Epic    Unable to Pay for Housing in the Last Year: No    Number of Times Moved in the Last Year:  0    Homeless in the Last Year: No  Utilities: Not At Risk (11/30/2024)   Epic    Threatened with loss of utilities: No  Health Literacy: Adequate Health Literacy (01/25/2024)   B1300 Health Literacy    Frequency of need for help with medical instructions: Never    Family History:   Family History  Problem Relation Age of Onset   Lymphoma Father    Hypertension Other    Vision loss Maternal Grandmother    Colon cancer Son 48   Asthma Neg Hx    Esophageal cancer Neg Hx    Rectal cancer Neg Hx    Stomach cancer Neg Hx    Colon polyps Neg Hx      ROS:  Please see the history of present illness.   All other ROS reviewed and negative.     Physical Exam/Data: Vitals:   11/30/24 0900 11/30/24 1055 11/30/24 1114 11/30/24 1243  BP: 126/77 (!) 148/77  (!) 116/50  Pulse: 95 60  (!) 115  Resp: 13 17  18   Temp:   97.6 F (36.4 C) 97.9 F (36.6 C)  TempSrc:   Oral Oral  SpO2: 100% 100%  96%   No intake or output data in the 24 hours ending 11/30/24 1458    11/28/2024    2:35 PM 11/14/2024    2:39 PM 11/11/2024    9:55 AM  Last 3 Weights  Weight (lbs) 197 lb 195 lb 6.4 oz 194 lb 12.8 oz  Weight (kg) 89.359 kg 88.633 kg 88.361 kg     There is no height or weight on file to calculate BMI.   General: Well developed, in no acute distress.  Neck: No JVD.  Cardiac: Normal rate, irregular rhythm.  Resp: Normal work of breathing.   Ext: No edema.  Neuro: No gross focal deficits.  Psych: Normal affect.    EKG:  The EKG was personally reviewed and demonstrates:  AFL then SR with frequent multifocal PACs Telemetry:  Telemetry was personally reviewed and demonstrates:  AFL then DCCV followed by SR with frequent multifocal PACs  Relevant CV Studies: Zio 12/13/23:   Echo 07/20/23:  1. Left ventricular ejection fraction, by estimation, is 55 to 60%. The  left ventricle has normal function. The left ventricle has no regional  wall motion abnormalities. There is mild left ventricular hypertrophy.  Left ventricular diastolic parameters  are consistent with Grade I diastolic dysfunction (impaired relaxation).   2. Right ventricular systolic function is normal. The right ventricular  size is normal. There is mildly elevated pulmonary artery systolic  pressure. The estimated right ventricular systolic pressure is 36.1 mmHg.   3. Left atrial size was moderately dilated.   4. The mitral valve is degenerative. Trivial mitral valve regurgitation.  Moderate mitral annular calcification.   5. Aortic valve regurgitation is not visualized. Aortic valve  sclerosis/calcification is present, without any evidence of aortic  stenosis.   6. The inferior vena cava is dilated in size with >50% respiratory  variability, suggesting right atrial pressure of 8 mmHg.   Laboratory Data: High Sensitivity Troponin:  No results for input(s): TROPONINIHS in the last 720 hours. No results for input(s): TRNPT in the last 720 hours.    Chemistry Recent Labs  Lab 11/30/24 0640  NA 142  K 5.2*  CL 104  CO2 26  GLUCOSE 95  BUN 86*  CREATININE 2.69*  CALCIUM  8.4*  MG 2.4  GFRNONAA 23*  ANIONGAP 12  Recent Labs  Lab 11/30/24 0640  PROT 5.3*  ALBUMIN 3.2*  AST 27  ALT 26  ALKPHOS 62  BILITOT 1.0   Lipids No results for input(s): CHOL, TRIG, HDL, LABVLDL, LDLCALC, CHOLHDL in the last 168 hours.  Hematology Recent  Labs  Lab 11/30/24 0640  WBC 9.6  RBC 5.32  HGB 14.3  HCT 46.2  MCV 86.8  MCH 26.9  MCHC 31.0  RDW 18.5*  PLT 46*   Thyroid   Recent Labs  Lab 11/30/24 0640  TSH <0.100*    BNPNo results for input(s): BNP, PROBNP in the last 168 hours.  DDimer No results for input(s): DDIMER in the last 168 hours.  Radiology/Studies:  CT HEAD WO CONTRAST ( ) Result Date: 11/30/2024 EXAM: CT HEAD WITHOUT CONTRAST 11/30/2024 09:14:15 AM TECHNIQUE: CT of the head was performed without the administration of intravenous contrast. Automated exposure control, iterative reconstruction, and/or weight based adjustment of the mA/kV was utilized to reduce the radiation dose to as low as reasonably achievable. COMPARISON: CT of the head dated 06/23/2022. CLINICAL HISTORY: Head trauma, moderate-severe. FINDINGS: BRAIN AND VENTRICLES: No acute hemorrhage. No evidence of acute infarct. Proportional prominence of ventricles and sulci, consistent with diffuse cerebral parenchymal volume loss. No hydrocephalus. No extra-axial collection. No mass effect or midline shift. Calcified atherosclerotic plaque within cavernous/supraclinoid ICA and intradural vertebral arteries. ORBITS: Bilateral lens replacement noted. SINUSES: Mucosal disease associated with calcification within left maxillary sinus. Mucosal thickening in left frontal sinus and anterior left ethmoid air cells. SOFT TISSUES AND SKULL: No acute soft tissue abnormality. No skull fracture. IMPRESSION: 1. No acute intracranial abnormality related to head trauma. 2. Mucosal disease in the left maxillary sinus with calcification, and mucosal thickening in the left frontal sinus and anterior left ethmoid air cells. Electronically signed by: Evalene Coho MD 11/30/2024 09:54 AM EST RP Workstation: [id number]   CT CERVICAL SPINE WO CONTRAST Result Date: 11/30/2024 EXAM: CT CERVICAL SPINE WITHOUT CONTRAST 11/30/2024 09:14:15 AM TECHNIQUE: CT of the cervical spine  was performed without the administration of intravenous contrast. Multiplanar reformatted images are provided for review. Automated exposure control, iterative reconstruction, and/or weight based adjustment of the mA/kV was utilized to reduce the radiation dose to as low as reasonably achievable. COMPARISON: None available. CLINICAL HISTORY: Neck trauma (Age >= 65y). Neck trauma (age >= 65 years). FINDINGS: BONES AND ALIGNMENT: There is mild degenerative anterolisthesis present at C5-C6. There is no evidence of fracture or acute traumatic injury. DEGENERATIVE CHANGES: There is moderate chronic degenerative disc disease present at C5-C6 and C6-C7. There are also moderate degenerative changes present within the atlantoaxial joint. There is diffuse bilateral facet arthropathy. SOFT TISSUES: No prevertebral soft tissue swelling. There is moderate calcification within the carotid bulbs bilaterally. IMPRESSION: 1. No evidence of fracture or acute traumatic injury. 2. Mild degenerative anterolisthesis at C5-6. 3. Moderate chronic degenerative disc disease at C5-6 and C6-7. 4. Moderate degenerative changes within the atlantoaxial joint. 5. Diffuse bilateral facet arthropathy. Electronically signed by: Evalene Coho MD 11/30/2024 09:51 AM EST RP Workstation: [id number]   DG Chest Port 1 View Result Date: 11/30/2024 CLINICAL DATA:  aflutter EXAM: PORTABLE CHEST 1 VIEW COMPARISON:  November 20, 2023, October 23, 2017 FINDINGS: The cardiomediastinal silhouette is unchanged and enlarged in contour.Elevation of the LEFT hemidiaphragm. No pleural effusion. No pneumothorax. No acute pleuroparenchymal abnormality. IMPRESSION: No acute cardiopulmonary abnormality. Electronically Signed   By: Corean Salter M.D.   On: 11/30/2024 08:24     Assessment and Plan: [name] is  an 84 year old male who presented to the ED after a syncopal episode.  He was found to be in atrial flutter while in the ER.  He underwent  cardioversion.  He has since been in sinus rhythm with frequent multifocal PACs and occasional bursts of nonsustained atrial tachycardia.  Upon review of his Zio monitor he wore a year ago, this is his baseline.  He denies palpitations.  Unclear whether his syncopal event was related to his arrhythmia.  He was recently placed on high-dose steroids which could have precipitated his atrial flutter.  #Atrial flutter #Frequent PACs #Paroxysmal atrial tachycardia  Plan: - Start oral diltiazem  60mg  TID. - Continue Eliquis  2.5mg  BID for at least 1 month in setting of recent cardioversion.  Given his fall risk, will need risk-benefit discussion regarding long-term use of oral anticoagulation after this 1 month period.  Does not need aspirin  while on Eliquis . - Resume home torsemide .  Closely monitor urine output and electrolytes.  Signed, Fonda Kitty, MD  11/30/2024 2:58 PM     [1]  Allergies Allergen Reactions   Biaxin  [Clarithromycin ]     Dizziness   Allantoin Other (See Comments)   Atorvastatin  Other (See Comments)    weakness  atorvastatin    Atorvastatin  Calcium  Other (See Comments)    atorvastatin  calcium    Tizanidine  Other (See Comments)    Felt funny  tizanidine    Allantoin-Pramoxine Other (See Comments)    Pt does't remember reaction   Amlodipine  Swelling and Other (See Comments)    Edema  Edema     Edema   Amoxicillin  Other (See Comments), Rash and Dermatitis    Rash on arms that developed towards end of 7 day treatment  amoxicillin    Benzalkonium Chloride Itching, Rash and Dermatitis    Other reaction(s): mild rash/itching   Doxycycline  Photosensitivity, Rash and Dermatitis    rash  doxycycline    Metoprolol  Other (See Comments)    REACTION: fatigue    Fatigue   Metoprolol  Tartrate Other (See Comments)    Fatigue   Pramoxine Other (See Comments)    Pt does't remember reaction   "

## 2024-11-30 NOTE — ED Notes (Signed)
 Pt given hearing aid back and placed in right ear. No left hearing aid arrived with pt.

## 2024-11-30 NOTE — Progress Notes (Signed)
 PHARMACY - ANTICOAGULATION CONSULT NOTE  Pharmacy Consult for Eliquis  Indication: atrial fibrillation  Allergies[1]  Patient Measurements:    Vital Signs: Temp: 97.6 F (36.4 C) (01/31 1114) Temp Source: Oral (01/31 1114) BP: 148/77 (01/31 1055) Pulse Rate: 60 (01/31 1055)  Labs: Recent Labs    11/30/24 0640 11/30/24 0703  HGB 14.3  --   HCT 46.2  --   PLT 46*  --   LABPROT  --  15.2  INR  --  1.1  CREATININE 2.69*  --     Estimated Creatinine Clearance: 22.8 mL/min (A) (by C-G formula based on SCr of 2.69 mg/dL (H)).   Medical History: Past Medical History:  Diagnosis Date   Allergy     Arthritis    Calculus of gallbladder without mention of cholecystitis    Cataract    bilateral cateracts removed   Celiac disease    Chronic kidney disease    stage 3   Coronary atherosclerosis of unspecified type of vessel, native or graft    Dermatitis 06/08/2021   Erythema of lower extremity 09/08/2021   Esophageal reflux    Glaucoma    History of cellulitis 09/08/2021   History of kidney stones    Loss of weight    Lower extremity weakness 05/31/2021   Macular degeneration    Rt eye . injections   Old myocardial infarction    Osteoporosis, unspecified    Other malaise and fatigue    Other specified cardiac dysrhythmias(427.89)    Other testicular hypofunction    Pancreatic insufficiency 05/31/2021   Peripheral vascular disease    Personal history of colonic polyps    Postural dizziness with presyncope 12/13/2021   Pure hypercholesterolemia    Thrombocytopenia    chronic, per PCP notes dating back to 2009   Tinea pedis 05/31/2021   Unspecified asthma(493.90)    Unspecified essential hypertension    Unspecified hypothyroidism    Venous stasis dermatitis 09/08/2021   Assessment: 83 yom with history of CKD presenting with LOC and fall. Noted with new onset A flutter in the ED. Apixaban  per pharmacy for AF in place. Cardioverted in the ED.  Platelets noted to  be 46. Will require close monitoring. Hgb 14.3  Dose reduction criteria met: Age > 80 Scr > 1.5  Goal of Therapy:  Monitor platelets by anticoagulation protocol: Yes   Plan:  Apixaban  2.5 mg BID Monitor for s/s of hemorrhage and renal function Educate on use prior to discharge  Dorn LITTIE Buttner 11/30/2024,11:19 AM      [1]  Allergies Allergen Reactions   Biaxin  [Clarithromycin ]     Dizziness   Allantoin Other (See Comments)   Atorvastatin  Other (See Comments)    weakness  atorvastatin    Atorvastatin  Calcium  Other (See Comments)    atorvastatin  calcium    Tizanidine  Other (See Comments)    Felt funny  tizanidine    Allantoin-Pramoxine Other (See Comments)    Pt does't remember reaction   Amlodipine  Swelling and Other (See Comments)    Edema  Edema     Edema   Amoxicillin  Other (See Comments), Rash and Dermatitis    Rash on arms that developed towards end of 7 day treatment  amoxicillin    Benzalkonium Chloride Itching, Rash and Dermatitis    Other reaction(s): mild rash/itching   Doxycycline  Photosensitivity, Rash and Dermatitis    rash  doxycycline    Metoprolol  Other (See Comments)    REACTION: fatigue    Fatigue   Metoprolol  Tartrate Other (See Comments)  Fatigue   Pramoxine Other (See Comments)    Pt does't remember reaction

## 2024-12-01 ENCOUNTER — Encounter (HOSPITAL_COMMUNITY): Payer: Self-pay | Admitting: Internal Medicine

## 2024-12-01 ENCOUNTER — Observation Stay (HOSPITAL_COMMUNITY)

## 2024-12-01 DIAGNOSIS — I4892 Unspecified atrial flutter: Secondary | ICD-10-CM | POA: Diagnosis not present

## 2024-12-01 LAB — CBC
HCT: 40 % (ref 39.0–52.0)
Hemoglobin: 12.5 g/dL — ABNORMAL LOW (ref 13.0–17.0)
MCH: 26.9 pg (ref 26.0–34.0)
MCHC: 31.3 g/dL (ref 30.0–36.0)
MCV: 86 fL (ref 80.0–100.0)
Platelets: 36 10*3/uL — ABNORMAL LOW (ref 150–400)
RBC: 4.65 MIL/uL (ref 4.22–5.81)
RDW: 18.3 % — ABNORMAL HIGH (ref 11.5–15.5)
WBC: 8.6 10*3/uL (ref 4.0–10.5)
nRBC: 0.3 % — ABNORMAL HIGH (ref 0.0–0.2)

## 2024-12-01 LAB — ECHOCARDIOGRAM COMPLETE
AR max vel: 1.53 cm2
AV Area VTI: 1.69 cm2
AV Area mean vel: 1.54 cm2
AV Mean grad: 9 mmHg
AV Peak grad: 16.8 mmHg
Ao pk vel: 2.05 m/s
Area-P 1/2: 4.68 cm2
Height: 72 in
MV M vel: 3.35 m/s
MV Peak grad: 44.9 mmHg
MV VTI: 1.95 cm2
S' Lateral: 3.4 cm
Single Plane A2C EF: 65.6 %

## 2024-12-01 LAB — BASIC METABOLIC PANEL WITH GFR
Anion gap: 15 (ref 5–15)
BUN: 80 mg/dL — ABNORMAL HIGH (ref 8–23)
CO2: 26 mmol/L (ref 22–32)
Calcium: 7.8 mg/dL — ABNORMAL LOW (ref 8.9–10.3)
Chloride: 100 mmol/L (ref 98–111)
Creatinine, Ser: 2.55 mg/dL — ABNORMAL HIGH (ref 0.61–1.24)
GFR, Estimated: 24 mL/min — ABNORMAL LOW
Glucose, Bld: 186 mg/dL — ABNORMAL HIGH (ref 70–99)
Potassium: 5.1 mmol/L (ref 3.5–5.1)
Sodium: 141 mmol/L (ref 135–145)

## 2024-12-01 LAB — TECHNOLOGIST SMEAR REVIEW: Plt Morphology: DECREASED

## 2024-12-01 MED ORDER — APIXABAN 2.5 MG PO TABS
2.5000 mg | ORAL_TABLET | Freq: Two times a day (BID) | ORAL | Status: AC
  Administered 2024-12-01 – 2024-12-06 (×12): 2.5 mg via ORAL
  Filled 2024-12-01 (×12): qty 1

## 2024-12-01 MED ORDER — DILTIAZEM HCL 60 MG PO TABS
90.0000 mg | ORAL_TABLET | Freq: Three times a day (TID) | ORAL | Status: DC
  Administered 2024-12-01 (×2): 90 mg via ORAL
  Filled 2024-12-01 (×2): qty 1

## 2024-12-01 MED ORDER — MELATONIN 3 MG PO TABS
3.0000 mg | ORAL_TABLET | Freq: Every day | ORAL | Status: AC
  Administered 2024-12-01 – 2024-12-06 (×6): 3 mg via ORAL
  Filled 2024-12-01 (×6): qty 1

## 2024-12-01 MED ORDER — PREDNISONE 20 MG PO TABS
20.0000 mg | ORAL_TABLET | Freq: Every day | ORAL | Status: DC
  Filled 2024-12-01: qty 1

## 2024-12-01 MED ORDER — DILTIAZEM HCL 60 MG PO TABS
120.0000 mg | ORAL_TABLET | Freq: Three times a day (TID) | ORAL | Status: DC
  Administered 2024-12-02 – 2024-12-03 (×4): 120 mg via ORAL
  Filled 2024-12-01 (×4): qty 2

## 2024-12-01 MED ORDER — OLANZAPINE 10 MG IM SOLR
10.0000 mg | Freq: Once | INTRAMUSCULAR | Status: AC | PRN
  Administered 2024-12-01: 10 mg via INTRAMUSCULAR
  Filled 2024-12-01: qty 10

## 2024-12-01 MED ORDER — DILTIAZEM HCL 30 MG PO TABS
30.0000 mg | ORAL_TABLET | Freq: Once | ORAL | Status: AC
  Administered 2024-12-02: 30 mg via ORAL
  Filled 2024-12-01: qty 1

## 2024-12-01 NOTE — Plan of Care (Signed)

## 2024-12-01 NOTE — Progress Notes (Addendum)
 [facility] PROGRESS NOTE    Progress Note  Scott Oneal  FMW:[id number] DOB: May 22, 1941 DOA: 11/30/2024 PCP: Garald Karlynn GAILS, MD     Brief Narrative:   Scott Oneal is an 84 y.o. male past medical history significant for essential hypertension, hyperlipidemia, chronic  diastolic heart failure with an EF of 55%, paroxysmal atrial tachycardia history of CAD status post DES, chronic thrombocytopenia of 87 back in December 2025, chronic disease stage IV discharged from [facility] 10/14/2024 for acute kidney injury on comes in after an episode of loss of consciousness this morning where she relates her legs gave out she tried to get up blacked out, in the ED was found to be in a flutter and 160 platelets of 46 potassium 5.2 CT scan of the cervical spine showed no acute abnormality CT of the head showed sinusitis, no acute intracranial abnormalities.  Assessment/Plan:   Syncope likely due to atrial flutter/atrial fibrillation Patient was started on a Cardizem  drip, cardioverted in the ED started on Eliquis  Increase diltiazem  for better rate control. EP was consulted recommended to continue diltiazem  and Eliquis . He was recently on high-dose steroids which could have precipitated his atrial flutter. Try to keep potassium greater than 4 magnesium greater than 2.  Acute on chronic thrombocytopenia: Currently on Eliquis  no signs of overt bleeding, except he has some bruises on his arms. Hemoglobin is relatively stable. There is no previous exposure to heparin  seems to be new since last year. Check peripheral blood smear. He appropriately is significantly low and has not been exposed to heparin  during this admission and has been more than 10 days since his last admission. The most Probable cause is his recent viral illness.    Acute bronchitis: With a new cough and wheezing. Chest x-ray showed no acute abnormality. Influenza SARS-CoV-2 and RSV PCR is negative. He was recently  started on steroids which we will continue continue to taper down.  Acute confusional state: Keep the blinds open during day close at night. Family to reorient them.  HFrEF: 2D echo this morning is pending, last 2D echo showed an EF of 55% and required no solid heart failure. Continue torsemide  strict I's and O's and daily weights.  Hyperkalemia: Potassium elevated 5.2.  Slowly coming down. No EKG changes.  Fall at home, initial encounter No events on telemetry. PT OT to evaluate.  Chronic pancreatic insufficiency: Continue Creon .  CKD (chronic kidney disease) stage 4, GFR 15-29 ml/min (HCC) Baseline usually ranges around 2.4-2.8. Continue calcitriol .  Hypothyroidism Continue Synthroid .   DVT prophylaxis: Eliquis  Family Communication:none Status is: Observation The patient remains OBS appropriate and will d/c before 2 midnights.    Code Status:     Code Status Orders  (From admission, onward)           Start     Ordered   11/30/24 1056  Full code  Continuous       Question:  By:  Answer:  Consent: discussion documented in EHR   11/30/24 1101           Code Status History     Date Active Date Inactive Code Status Order ID Comments User Context   06/23/2022 2013 06/25/2022 1819 Full Code [id number]  Ricky Alfrieda DASEN, DO ED   06/21/2021 1352 06/22/2021 1748 Full Code [id number]  Matilda Senior, MD Inpatient   01/31/2020 1240 01/31/2020 1923 Full Code [id number]  Claudene Victory LELON, MD Inpatient   11/26/2014 0028 11/27/2014 2030 Full Code [id number]  [place]  P, PA-C ED         IV Access:   Peripheral IV   Procedures and diagnostic studies:   CT HEAD WO CONTRAST ( ) Result Date: 11/30/2024 EXAM: CT HEAD WITHOUT CONTRAST 11/30/2024 09:14:15 AM TECHNIQUE: CT of the head was performed without the administration of intravenous contrast. Automated exposure control, iterative reconstruction, and/or weight based adjustment of the mA/kV was utilized to reduce the  radiation dose to as low as reasonably achievable. COMPARISON: CT of the head dated 06/23/2022. CLINICAL HISTORY: Head trauma, moderate-severe. FINDINGS: BRAIN AND VENTRICLES: No acute hemorrhage. No evidence of acute infarct. Proportional prominence of ventricles and sulci, consistent with diffuse cerebral parenchymal volume loss. No hydrocephalus. No extra-axial collection. No mass effect or midline shift. Calcified atherosclerotic plaque within cavernous/supraclinoid ICA and intradural vertebral arteries. ORBITS: Bilateral lens replacement noted. SINUSES: Mucosal disease associated with calcification within left maxillary sinus. Mucosal thickening in left frontal sinus and anterior left ethmoid air cells. SOFT TISSUES AND SKULL: No acute soft tissue abnormality. No skull fracture. IMPRESSION: 1. No acute intracranial abnormality related to head trauma. 2. Mucosal disease in the left maxillary sinus with calcification, and mucosal thickening in the left frontal sinus and anterior left ethmoid air cells. Electronically signed by: Evalene Coho MD 11/30/2024 09:54 AM EST RP Workstation: [id number]   CT CERVICAL SPINE WO CONTRAST Result Date: 11/30/2024 EXAM: CT CERVICAL SPINE WITHOUT CONTRAST 11/30/2024 09:14:15 AM TECHNIQUE: CT of the cervical spine was performed without the administration of intravenous contrast. Multiplanar reformatted images are provided for review. Automated exposure control, iterative reconstruction, and/or weight based adjustment of the mA/kV was utilized to reduce the radiation dose to as low as reasonably achievable. COMPARISON: None available. CLINICAL HISTORY: Neck trauma (Age >= 65y). Neck trauma (age >= 65 years). FINDINGS: BONES AND ALIGNMENT: There is mild degenerative anterolisthesis present at C5-C6. There is no evidence of fracture or acute traumatic injury. DEGENERATIVE CHANGES: There is moderate chronic degenerative disc disease present at C5-C6 and C6-C7. There are also  moderate degenerative changes present within the atlantoaxial joint. There is diffuse bilateral facet arthropathy. SOFT TISSUES: No prevertebral soft tissue swelling. There is moderate calcification within the carotid bulbs bilaterally. IMPRESSION: 1. No evidence of fracture or acute traumatic injury. 2. Mild degenerative anterolisthesis at C5-6. 3. Moderate chronic degenerative disc disease at C5-6 and C6-7. 4. Moderate degenerative changes within the atlantoaxial joint. 5. Diffuse bilateral facet arthropathy. Electronically signed by: Evalene Coho MD 11/30/2024 09:51 AM EST RP Workstation: [id number]   DG Chest Port 1 View Result Date: 11/30/2024 CLINICAL DATA:  aflutter EXAM: PORTABLE CHEST 1 VIEW COMPARISON:  November 20, 2023, October 23, 2017 FINDINGS: The cardiomediastinal silhouette is unchanged and enlarged in contour.Elevation of the LEFT hemidiaphragm. No pleural effusion. No pneumothorax. No acute pleuroparenchymal abnormality. IMPRESSION: No acute cardiopulmonary abnormality. Electronically Signed   By: Corean Salter M.D.   On: 11/30/2024 08:24     Medical Consultants:   None.   Subjective:    Scott Oneal denies any complaints.  Objective:    Vitals:   11/30/24 1830 11/30/24 1913 11/30/24 2306 12/01/24 0414  BP:  (!) 112/90 (!) 122/57 134/67  Pulse:  78 (!) 51 (!) 106  Resp:  16 18 20   Temp:  97.6 F (36.4 C) 97.8 F (36.6 C) (!) 97.5 F (36.4 C)  TempSrc:  Oral Oral Axillary  SpO2:  96% 95% 97%  Height: 6' (1.829 m)      SpO2: 97 % O2 Flow Rate (L/min):  2 L/min   Intake/Output Summary (Last 24 hours) at 12/01/2024 0733 Last data filed at 12/01/2024 9287 Gross per 24 hour  Intake 150 ml  Output 2550 ml  Net -2400 ml   There were no vitals filed for this visit.  Exam: General exam: In no acute distress. Respiratory system: Good air movement and clear to auscultation. Cardiovascular system: S1 & S2 heard, RRR. No JVD. Gastrointestinal system:  Abdomen is nondistended, soft and nontender.  Central nervous system: Alert and oriented. No focal neurological deficits. Extremities: No pedal edema. Skin: Bruises in her arms. Psychiatry: Judgement and insight appear normal. Mood & affect appropriate.    Data Reviewed:    Labs: Basic Metabolic Panel: Recent Labs  Lab 11/30/24 0640 12/01/24 0227  NA 142 141  K 5.2* 5.1  CL 104 100  CO2 26 26  GLUCOSE 95 186*  BUN 86* 80*  CREATININE 2.69* 2.55*  CALCIUM  8.4* 7.8*  MG 2.4  --    GFR Estimated Creatinine Clearance: 24.1 mL/min (A) (by C-G formula based on SCr of 2.55 mg/dL (H)). Liver Function Tests: Recent Labs  Lab 11/30/24 0640  AST 27  ALT 26  ALKPHOS 62  BILITOT 1.0  PROT 5.3*  ALBUMIN 3.2*   No results for input(s): LIPASE, AMYLASE in the last 168 hours. No results for input(s): AMMONIA in the last 168 hours. Coagulation profile Recent Labs  Lab 11/30/24 0703  INR 1.1   COVID-19 Labs  No results for input(s): DDIMER, FERRITIN, LDH, CRP in the last 72 hours.  Lab Results  Component Value Date   SARSCOV2NAA RESULT: NEGATIVE 06/17/2021   SARSCOV2NAA NEGATIVE 01/29/2020   SARSCOV2NAA NEGATIVE 01/17/2020    CBC: Recent Labs  Lab 11/30/24 0640 11/30/24 2252 12/01/24 0227  WBC 9.6 12.0* 8.6  NEUTROABS  --  10.9*  --   HGB 14.3 13.0 12.5*  HCT 46.2 42.2 40.0  MCV 86.8 86.8 86.0  PLT 46* 47* 36*   Cardiac Enzymes: No results for input(s): CKTOTAL, CKMB, CKMBINDEX, TROPONINI in the last 168 hours. BNP (last 3 results) No results for input(s): PROBNP in the last 8760 hours. CBG: No results for input(s): GLUCAP in the last 168 hours. D-Dimer: No results for input(s): DDIMER in the last 72 hours. Hgb A1c: No results for input(s): HGBA1C in the last 72 hours. Lipid Profile: No results for input(s): CHOL, HDL, LDLCALC, TRIG, CHOLHDL, LDLDIRECT in the last 72 hours. Thyroid  function studies: Recent Labs     11/30/24 0640  TSH <0.100*   Anemia work up: No results for input(s): VITAMINB12, FOLATE, FERRITIN, TIBC, IRON, RETICCTPCT in the last 72 hours. Sepsis Labs: Recent Labs  Lab 11/30/24 0640 11/30/24 2252 12/01/24 0227  WBC 9.6 12.0* 8.6   Microbiology No results found for this or any previous visit (from the past 240 hours).   Medications:    albuterol   2.5 mg Nebulization BID   apixaban   2.5 mg Oral BID   calcitRIOL   0.25 mcg Oral q AM   diltiazem   60 mg Oral Q8H   gabapentin   300 mg Oral BID   levothyroxine   175 mcg Oral Daily   lipase/protease/amylase  72,000 Units Oral TID AC   mupirocin  ointment  1 Application Topical Daily   pantoprazole   40 mg Oral BID   predniSONE   40 mg Oral Q breakfast   sodium chloride  flush  3 mL Intravenous Q12H   torsemide   40 mg Oral Daily   Continuous Infusions:    LOS:  0 days   Erle Odell Castor  [facility]  12/01/2024, 7:33 AM

## 2024-12-01 NOTE — Progress Notes (Signed)
"  °  Progress Note  Patient Name: Scott Oneal Date of Encounter: 12/01/2024 [facility] Cardiologist: Scott Fell, MD   Interval Summary   Some delirium. Now in mittens. Patient does however report feeling relatively well. No new or acute complaints.   Vital Signs Vitals:   11/30/24 1830 11/30/24 1913 11/30/24 2306 12/01/24 0414  BP:  (!) 112/90 (!) 122/57 134/67  Pulse:  78 (!) 51 (!) 106  Resp:  16 18 20   Temp:  97.6 F (36.4 C) 97.8 F (36.6 C) (!) 97.5 F (36.4 C)  TempSrc:  Oral Oral Axillary  SpO2:  96% 95% 97%  Height: 6' (1.829 m)       Intake/Output Summary (Last 24 hours) at 12/01/2024 0913 Last data filed at 12/01/2024 9287 Gross per 24 hour  Intake 150 ml  Output 2550 ml  Net -2400 ml      11/28/2024    2:35 PM 11/14/2024    2:39 PM 11/11/2024    9:55 AM  Last 3 Weights  Weight (lbs) 197 lb 195 lb 6.4 oz 194 lb 12.8 oz  Weight (kg) 89.359 kg 88.633 kg 88.361 kg      Telemetry/ECG  SR with frequent PACs - Personally Reviewed  Physical Exam  General: Well developed, in no acute distress.  Neck: No JVD.  Cardiac: Normal rate, irregular rhythm.  Resp: Normal work of breathing.  Ext: No edema.  Neuro: No gross focal deficits.  Psych: Normal affect.   Assessment & Plan  Scott Oneal is an 84 year old male who presented to the ED after a syncopal episode.  He was found to be in atrial flutter while in the ER.  He underwent cardioversion.  He has since been in sinus rhythm with frequent multifocal PACs and occasional bursts of nonsustained atrial tachycardia.  Upon review of his Zio monitor he wore a year ago, this is his baseline.  He denies palpitations.  Unclear whether his syncopal event was related to his arrhythmia.  He was recently placed on high-dose steroids which could have precipitated his atrial flutter and is likely worsening his arrhythmic burden.   #Atrial flutter #Frequent PACs #Paroxysmal atrial tachycardia   Plan: - Continue oral  diltiazem  90mg  TID. - Continue Eliquis  2.5mg  BID for ideally 1 month in setting of recent cardioversion.  Given his fall risk and thrombocytopenia, will need risk-benefit discussion regarding long-term use of oral anticoagulation after this 1 month period.  Does not need aspirin  while on Eliquis . - Continue home torsemide .  Closely monitor urine output and electrolytes.  Signed, Scott Kitty, MD   "

## 2024-12-01 NOTE — Evaluation (Signed)
 Physical Therapy Evaluation Patient Details Name: Scott Oneal MRN: [id number] DOB: [birth year] Today's Date: 12/01/2024  History of Present Illness  Patient is 84 yo male admitted on 11/30/24 for syncope, likely due to atrial flutter. PMH significant for Afib, macular degeneration, essential hypertension, hyperlipidemia, chronic diastolic heart failure with an EF of 55%, paroxysmal atrial tachycardia history of CAD status post DES, chronic thrombocytopenia of 87 back in December 2025, chronic disease stage IV discharged from [facility] 10/14/2024 for AKI.  Clinical Impression  Patient is poor historian, unable to obtain PLOF. Oriented x3. Patient presents with moderate confusion, significant impairments in functional mobility, generalized weakness, and poor sitting balance. Patient with frequent posterior LOB, requiring mod-maxA to recover. Able to tolerate ~15 minutes seated EOB. Sit to stand transfers with maxA, inability to advance feet for sidestepping. Patient will continue to benefit from skilled acute PT services. At this time, patient is most appropriate for post-acute rehab < 3 hours/day.       If plan is discharge home, recommend the following: A lot of help with bathing/dressing/bathroom;Two people to help with walking and/or transfers;Assistance with cooking/housework;Direct supervision/assist for medications management;Direct supervision/assist for financial management;Supervision due to cognitive status   Can travel by private vehicle   No    Equipment Recommendations Other (comment) (will continue to assess)  Recommendations for Other Services       Functional Status Assessment Patient has had a recent decline in their functional status and demonstrates the ability to make significant improvements in function in a reasonable and predictable amount of time.     Precautions / Restrictions Precautions Precautions: Fall Recall of Precautions/Restrictions:  Impaired Restrictions Weight Bearing Restrictions Per Provider Order: No      Mobility  Bed Mobility Overal bed mobility: Needs Assistance Bed Mobility: Supine to Sit, Sit to Supine     Supine to sit: Max assist, HOB elevated, Used rails Sit to supine: Mod assist   General bed mobility comments: Patient with poor sequencing, max cues, maxA for trunk righting with poor ability to maintain    Transfers Overall transfer level: Needs assistance Equipment used: Rolling walker (2 wheels) Transfers: Sit to/from Stand Sit to Stand: Max assist           General transfer comment: HOB elevated, STS transfer x3 reps from EOB to 2WW, maxA and <5 seconds able to maintain    Ambulation/Gait               General Gait Details: unable, attempted side stepping at EOB, unable to clear LEs.  Stairs            Wheelchair Mobility     Tilt Bed    Modified Rankin (Stroke Patients Only)       Balance Overall balance assessment: Needs assistance Sitting-balance support: Bilateral upper extremity supported, Feet supported Sitting balance-Leahy Scale: Poor Sitting balance - Comments: Frequently losing balance posteriorly, limited awareness of deficits. Requiring CGA at all times, able to maintain for several minutes but occasionally requiring mod-maxA for recovery. Postural control: Posterior lean Standing balance support: Reliant on assistive device for balance Standing balance-Leahy Scale: Zero Standing balance comment: <5 seconds in standing with maxA                             Pertinent Vitals/Pain Pain Assessment Pain Assessment: No/denies pain    Home Living  Prior Function Prior Level of Function : Patient poor historian/Family not available             Mobility Comments: Patient reports ambulating with SPC occasionally. Lives with his wife doesn't get around well. Reports split level home with stair lift.  Poor historian, confused on evaluation.       Extremity/Trunk Assessment        Lower Extremity Assessment Lower Extremity Assessment: Generalized weakness    Cervical / Trunk Assessment Cervical / Trunk Assessment: Kyphotic  Communication   Communication Communication: Impaired Factors Affecting Communication: Reduced clarity of speech    Cognition Arousal: Alert Behavior During Therapy: Restless, Anxious   PT - Cognitive impairments: Orientation, No family/caregiver present to determine baseline, Sequencing, Safety/Judgement, Awareness, Memory, Attention   Orientation impairments: Place                   PT - Cognition Comments: Patient oriented to name/DOB, date, year, month. Not oriented to place, stated he was in church and waiting for a ride from [employer]. Patient also mixing words and not making sense with every sentence. Following commands: Impaired Following commands impaired: Follows one step commands inconsistently, Follows one step commands with increased time     Cueing Cueing Techniques: Verbal cues, Gestural cues, Tactile cues     General Comments      Exercises     Assessment/Plan    PT Assessment Patient needs continued PT services  PT Problem List Decreased strength;Decreased activity tolerance;Decreased balance;Decreased mobility;Decreased knowledge of precautions;Decreased safety awareness;Decreased knowledge of use of DME;Cardiopulmonary status limiting activity       PT Treatment Interventions DME instruction;Gait training;Functional mobility training;Therapeutic activities;Therapeutic exercise;Balance training;Neuromuscular re-education;Patient/family education    PT Goals (Current goals can be found in the Care Plan section)  Acute Rehab PT Goals Patient Stated Goal: Patient stated his goal is to return home. PT Goal Formulation: With patient Time For Goal Achievement: 12/15/24 Potential to Achieve Goals: Fair    Frequency Min  2X/week     Co-evaluation               AM-PAC PT 6 Clicks Mobility  Outcome Measure Help needed turning from your back to your side while in a flat bed without using bedrails?: A Little Help needed moving from lying on your back to sitting on the side of a flat bed without using bedrails?: A Lot Help needed moving to and from a bed to a chair (including a wheelchair)?: A Lot Help needed standing up from a chair using your arms (e.g., wheelchair or bedside chair)?: A Lot Help needed to walk in hospital room?: Total Help needed climbing 3-5 steps with a railing? : Total 6 Click Score: 11    End of Session Equipment Utilized During Treatment: Gait belt Activity Tolerance: Patient tolerated treatment well Patient left: in bed;with call bell/phone within reach;with bed alarm set (bilateral mittens redonned.) Nurse Communication: Mobility status;Other (comment) (coughing up thick mucus with blood. Also coughing after sipping water . Confusion) PT Visit Diagnosis: Unsteadiness on feet (R26.81);Other abnormalities of gait and mobility (R26.89);Muscle weakness (generalized) (M62.81);History of falling (Z91.81);Difficulty in walking, not elsewhere classified (R26.2)    Time: 1220-1300 PT Time Calculation (min) (ACUTE ONLY): 40 min   Charges:   PT Evaluation $PT Eval Moderate Complexity: 1 Mod PT Treatments $Therapeutic Activity: 23-37 mins PT General Charges $$ ACUTE PT VISIT: 1 Visit         Scott Johnson, PT, DPT [facility] Acute Rehabilitation Office: [phone])  [phone]   Scott Oneal 12/01/2024, 2:17 PM

## 2024-12-01 NOTE — Progress Notes (Signed)
" °  Echocardiogram 2D Echocardiogram has been performed.  Koleen KANDICE Popper, RDCS 12/01/2024, 2:56 PM "

## 2024-12-02 ENCOUNTER — Other Ambulatory Visit (HOSPITAL_COMMUNITY): Payer: Self-pay

## 2024-12-02 ENCOUNTER — Telehealth (HOSPITAL_COMMUNITY): Payer: Self-pay

## 2024-12-02 ENCOUNTER — Ambulatory Visit: Payer: Self-pay

## 2024-12-02 ENCOUNTER — Telehealth: Payer: Self-pay

## 2024-12-02 DIAGNOSIS — E875 Hyperkalemia: Secondary | ICD-10-CM

## 2024-12-02 DIAGNOSIS — I4892 Unspecified atrial flutter: Secondary | ICD-10-CM

## 2024-12-02 LAB — BASIC METABOLIC PANEL WITH GFR
Anion gap: 16 — ABNORMAL HIGH (ref 5–15)
BUN: 70 mg/dL — ABNORMAL HIGH (ref 8–23)
CO2: 27 mmol/L (ref 22–32)
Calcium: 8.5 mg/dL — ABNORMAL LOW (ref 8.9–10.3)
Chloride: 101 mmol/L (ref 98–111)
Creatinine, Ser: 2.42 mg/dL — ABNORMAL HIGH (ref 0.61–1.24)
GFR, Estimated: 26 mL/min — ABNORMAL LOW
Glucose, Bld: 112 mg/dL — ABNORMAL HIGH (ref 70–99)
Potassium: 4.7 mmol/L (ref 3.5–5.1)
Sodium: 144 mmol/L (ref 135–145)

## 2024-12-02 LAB — CBC
HCT: 41.8 % (ref 39.0–52.0)
Hemoglobin: 13.1 g/dL (ref 13.0–17.0)
MCH: 26.8 pg (ref 26.0–34.0)
MCHC: 31.3 g/dL (ref 30.0–36.0)
MCV: 85.7 fL (ref 80.0–100.0)
Platelets: 47 10*3/uL — ABNORMAL LOW (ref 150–400)
RBC: 4.88 MIL/uL (ref 4.22–5.81)
RDW: 18.3 % — ABNORMAL HIGH (ref 11.5–15.5)
WBC: 10.9 10*3/uL — ABNORMAL HIGH (ref 4.0–10.5)
nRBC: 0 % (ref 0.0–0.2)

## 2024-12-02 LAB — MAGNESIUM: Magnesium: 2.3 mg/dL (ref 1.7–2.4)

## 2024-12-02 MED ORDER — AMIODARONE HCL IN DEXTROSE 360-4.14 MG/200ML-% IV SOLN
60.0000 mg/h | INTRAVENOUS | Status: AC
  Administered 2024-12-02: 60 mg/h via INTRAVENOUS
  Filled 2024-12-02 (×3): qty 200

## 2024-12-02 MED ORDER — AMIODARONE HCL IN DEXTROSE 360-4.14 MG/200ML-% IV SOLN
30.0000 mg/h | INTRAVENOUS | Status: DC
  Administered 2024-12-02 – 2024-12-03 (×2): 30 mg/h via INTRAVENOUS
  Filled 2024-12-02 (×2): qty 200

## 2024-12-02 MED ORDER — PREDNISONE 10 MG PO TABS: 10.0000 mg | ORAL_TABLET | Freq: Every day | ORAL | Status: DC

## 2024-12-02 MED ORDER — AMIODARONE LOAD VIA INFUSION
150.0000 mg | Freq: Once | INTRAVENOUS | Status: AC
  Administered 2024-12-02: 150 mg via INTRAVENOUS
  Filled 2024-12-02: qty 83.34

## 2024-12-02 MED ORDER — HALOPERIDOL LACTATE 5 MG/ML IJ SOLN: 2.0000 mg | Freq: Four times a day (QID) | INTRAMUSCULAR | Status: AC | PRN

## 2024-12-02 MED ORDER — AMIODARONE IV BOLUS ONLY 150 MG/100ML
150.0000 mg | Freq: Once | INTRAVENOUS | Status: AC
  Administered 2024-12-02: 150 mg via INTRAVENOUS
  Filled 2024-12-02: qty 100

## 2024-12-02 MED ORDER — METOPROLOL TARTRATE 5 MG/5ML IV SOLN
5.0000 mg | Freq: Four times a day (QID) | INTRAVENOUS | Status: AC | PRN
  Administered 2024-12-02: 5 mg via INTRAVENOUS
  Filled 2024-12-02: qty 5

## 2024-12-02 NOTE — Plan of Care (Signed)
" °  Problem: Clinical Measurements: Goal: Diagnostic test results will improve Outcome: Progressing Goal: Respiratory complications will improve Outcome: Progressing Goal: Cardiovascular complication will be avoided Outcome: Not Progressing   Problem: Coping: Goal: Level of anxiety will decrease Outcome: Not Progressing   "

## 2024-12-02 NOTE — Telephone Encounter (Unsigned)
 Copied from CRM [id number]. Topic: Clinical - Prescription Issue >> Dec 02, 2024 12:11 PM Alexandria E wrote: Reason for CRM: Patient's daughter in law, Kate, called in stating that the patient's prescriptions have been going to [facility] when they should be going to [facility] as that is the pharmacy associated with the patient's assisted living. Kate and her husband are not able to get these medication due to the roads, when she asked for all future refills to go to [facility], Kate was not able to relay the exact medications of which ones need to be transferred.

## 2024-12-02 NOTE — Care Management (Signed)
 LVM with patient's wife requesting callback. Will review MOON letter when call returned.

## 2024-12-02 NOTE — Telephone Encounter (Signed)
" °  Notification of hospital admission. Requesting fax of AVS from 11/29/24 OV to [facility] [phone] attention Bianca.    Reason for Disposition  Notification of hospital admission  Answer Assessment - Initial Assessment Questions 1. REASON FOR CALL or QUESTION: What is your reason for calling today? or How can I best     Make PCP aware that patient is inpatient at [facility] s/p fall.  2. CALLER: Document the source of call. (e.g., laboratory staff, caregiver or patient). Renda Other. Fax AVS to [phone]       Renda calling from [facility] to inform Dr. Garald that Jony has been admitted to [facility] hospital s/p fall. On 11/30/24 Jourdan stood up, lost his balance and fell. He was found with a heart rate of 160. He was admitted to [facility] hospital and started on cardizem  drip for a-flutter, cardioversion.   Renda is also asking for AVS from OV on 11/29/24 be faxed to [phone]. Renly struggles with memory issues and did not have medical formed filled out during OV for assisted living staff. Renda is asking for details of visit and any medications prescribed. Chart review showed medications [facility], Renda stated that it has been requested by [facility] and patient son for all prescriptions to go to [facility] since they send the summary to [place] and patient is forgetful, he will not remember to pick up scripts etc.   Please fax AVS from 11/29/24 to [phone] attention [facility].  Protocols used: PCP Call - No Triage-A-AH  Message from Martinez F sent at 12/02/2024 11:43 AM EST  Summary: Patient fall   Reason for Triage: Assisted living worker, Renda, states patient had a fall on Saturday 1/31 and was taken to the hospital. Still admitted at the hospital. The fall was there at the assisted living facility, patient stood up, felt dizzy and fell. Heart rate was 160-EMS was called. "

## 2024-12-02 NOTE — TOC Initial Note (Addendum)
 Transition of Care [facility]) - Initial/Assessment Note    Patient Details  Name: Scott Oneal MRN: [id number] Date of Birth: [birth year]  Transition of Care [facility]) CM/SW Contact:    Isaiah Public, LCSWA Phone Number: 12/02/2024, 11:13 AM  Clinical Narrative:                  CSW received consult for possible SNF placement at time of discharge. Due to patients current orientation CSW LVM for patients spouse Erminio. CSW awaiting call back to discuss PT recommendations for patient.   Update- CSW unable to reach patients spouse CSW spoke with patients son Chad. Chad informed CSW CSW can contact him and he will relay messages to patients spouse Erminio.   CSW spoke with patients son Chad  regarding PT recommendation of SNF placement for patient at time of discharge. Patients son Chad informed CSW that PTA patient comes from [facility]. Patients son expressed understanding of PT recommendation and is agreeable to SNF placement for patient at time of discharge. Patients son gave CSW permission to fax out initial referral for SNF placement for patient. Patients son and his wife had some questions for the MD. CSW informed MD.CSW discussed insurance authorization process. All questions answered. No further questions reported at this time. CSW following to start insurance authorization for SNF and PTAR closer to patient being medically ready.CSW to continue to follow and assist with discharge planning needs.      Patient Goals and CMS Choice            Expected Discharge Plan and Services                                              Prior Living Arrangements/Services                       Activities of Daily Living   ADL Screening (condition at time of admission) Independently performs ADLs?: Yes (appropriate for developmental age) Is the patient deaf or have difficulty hearing?: Yes Does the patient have difficulty seeing, even when wearing glasses/contacts?:  No Does the patient have difficulty concentrating, remembering, or making decisions?: No  Permission Sought/Granted                  Emotional Assessment              Admission diagnosis:  Atrial flutter (HCC) [I48.92] Atrial flutter with rapid ventricular response (HCC) [I48.92] Patient Active Problem List   Diagnosis Date Noted   Atrial flutter (HCC) 11/30/2024   Fall at home, initial encounter 11/30/2024   Heart failure with preserved ejection fraction (HCC) 11/30/2024   Anemia of chronic renal failure 09/25/2024   Ear lesion 08/28/2024   Ulcer of lower extremity with fat layer exposed (HCC) 03/14/2024   Stomatitis 01/29/2024   Knee pain, bilateral 01/10/2024   Dysphagia 10/03/2023   Knee pain, chronic 10/03/2023   Bladder neck obstruction 09/05/2023   Heart failure with mildly reduced ejection fraction (HFmrEF) (HCC) 07/24/2023   Mixed hyperlipidemia 07/24/2023   CKD (chronic kidney disease) stage 4, GFR 15-29 ml/min (HCC) 07/24/2023   Elevated alkaline phosphatase level 07/24/2023   Tachycardia 07/04/2023   Lumbar spondylosis 05/30/2023   Vasomotor rhinitis 05/15/2023   Situational depression 02/09/2023   Dyslipidemia 01/12/2023   Familial tremor 01/12/2023   Gait disorder 12/26/2022  Neuropathic pain 12/02/2022   Claudication 09/20/2022   Postphlebitic syndrome 08/11/2022   Anticoagulant long-term use 06/29/2022   Absent foot pulse 06/23/2022   Cold left foot 06/23/2022   Leg pain, central, left 06/23/2022   DVT (deep venous thrombosis) (HCC) 06/23/2022   Acute on chronic kidney failure 06/23/2022   CAD S/P percutaneous coronary angioplasty 06/23/2022   Arthritis 06/23/2022   PMR (polymyalgia rheumatica) 05/31/2022   Spinal stenosis of lumbar region 12/27/2021   Hemicrania 12/27/2021   Vision changes 12/27/2021   Postural dizziness with presyncope 12/13/2021   Erythema of lower extremity 09/08/2021   History of cellulitis 09/08/2021   Venous  stasis dermatitis 09/08/2021   Kidney stone 06/21/2021   Dermatitis 06/08/2021   Pancreatic insufficiency 05/31/2021   Lower extremity weakness 05/31/2021   Tinea pedis 05/31/2021   Nephrolithiasis 05/13/2021   Abnormal TSH 04/14/2021   Cellulitis of lower leg 04/05/2021   Dehydration 02/24/2021   Congestive heart failure (HCC) 03/02/2020   Diastolic dysfunction 02/06/2020   Anginal equivalent    Jaw pain 01/08/2020   Hip pain 10/10/2019   Night sweats 07/25/2019   Hand pain, right 04/24/2019   Degeneration of lumbar intervertebral disc 01/01/2019   Coccyx pain 11/13/2018   Hip bursitis, left 09/10/2018   Sciatica associated with disorder of lumbar spine 09/10/2018   Peroneal neuropathy at knee, left 07/09/2018   Macular degeneration 04/26/2018   Osteoarthritis of knee 01/08/2018   Bilateral buttock pain 11/29/2017   Cough 09/15/2017   Bursitis of hip, right 07/18/2017   Ischial bursitis 05/17/2017   Asthmatic bronchitis 05/17/2017   Elbow pain, left 11/21/2016   Irregular heart beat 09/27/2016   Hiccups 07/14/2016   Edema 06/06/2016   Insomnia 06/06/2016   Thigh pain 03/15/2016   Tinnitus 09/15/2015   Cramps of lower extremity 09/15/2015   UTI (urinary tract infection) 06/15/2015   Umbilical hernia 06/15/2015   Lower GI bleed 11/26/2014   Rectal bleeding 11/25/2014   History of colonic polyps 09/12/2014   Left foot pain 08/11/2014   Right foot pain 06/10/2014   Oral candidiasis 05/15/2014   Drug rash 04/30/2014   Low back pain 01/29/2014   Anxiety disorder 11/27/2013   Arthralgia 09/30/2013   Preop exam for internal medicine 06/19/2013   Preoperative clearance 06/19/2013   Urinary frequency 12/12/2012   Actinic keratoses 12/12/2012   Thrombocytopenia 11/19/2012   Well adult exam 09/10/2012   Shoulder pain, left 09/10/2012   Warts 09/10/2012   Headache 11/29/2011   Hyperkalemia 08/01/2011   Neuropathy of hand 08/01/2011   Numbness 04/28/2011   Fatigue  04/28/2011   Cellulitis of trunk 04/21/2011   Chalazion 07/07/2010   Disorder resulting from impaired renal function 03/10/2010   Neoplasm of uncertain behavior of skin 11/09/2009   PSA, INCREASED 11/09/2009   HYPERCHOLESTEROLEMIA 08/31/2009   Hypogonadism in male 07/03/2008   BRADYCARDIA 07/03/2008   History of colonic polyps 07/02/2008   Osteoporosis 06/03/2008   CHOLELITHIASIS 04/29/2008   Calculus of gallbladder 04/29/2008   Celiac disease 04/29/2008   WEIGHT LOSS 04/08/2008   Weight loss 04/08/2008   GERD (gastroesophageal reflux disease) 04/02/2008   TUBULOVILLOUS ADENOMA, COLON 04/01/2008   Moderate persistent asthma without complication 01/31/2008   Diarrhea 01/31/2008   Hypothyroidism 08/11/2007   Gout 08/11/2007   Essential hypertension 08/11/2007   MYOCARDIAL INFARCTION, HX OF 08/11/2007   Coronary artery disease involving native coronary artery of native heart without angina pectoris 08/11/2007   PCP:  Garald Karlynn GAILS, MD Pharmacy:  [facility] - [place], [place] - [address] [place] [address] Phone: [phone] Fax: [phone]  [id number] [place], [place] - [address] [place] [address] Phone: [phone] Fax: [phone]     Social Drivers of Health (SDOH) Social History: SDOH Screenings   Food Insecurity: No Food Insecurity (11/30/2024)  Housing: Low Risk (11/30/2024)  Transportation Needs: No Transportation Needs (11/30/2024)  Utilities: Not At Risk (11/30/2024)  Alcohol  Screen: Low Risk (01/25/2024)  Depression (PHQ2-9): Low Risk (11/28/2024)  Financial Resource Strain: Low Risk (01/25/2024)  Physical Activity: Inactive (01/25/2024)  Social Connections: Moderately Isolated (11/30/2024)  Stress: No Stress Concern Present (01/25/2024)  Tobacco Use: Low Risk (12/01/2024)  Health Literacy: Adequate Health Literacy (01/25/2024)   SDOH Interventions:      Readmission Risk Interventions     No data to display

## 2024-12-02 NOTE — TOC PASRR Note (Signed)
 [facility] IP TOC PASRR NOTE  RE: Scott Oneal  Date of Birth: February 13, 1941  Date: 12/02/2024    To Whom It May Concern:   Please be advised that the above-named patient will require a short-term nursing home stay - anticipated 30 days or less for rehabilitation and strengthening. The plan is for return home.

## 2024-12-02 NOTE — Telephone Encounter (Signed)
 Pharmacy Patient Advocate Encounter  Insurance verification completed.    The patient is insured through [employer]/ [employer]. Patient has Medicare and is not eligible for a copay card, but may be able to apply for patient assistance or Medicare RX Payment Plan (Patient Must reach out to their plan, if eligible for payment plan), if available.    Ran test claim for Eliquis 5mg  tablet and the current 30 day co-pay is $49.79.   This test claim was processed through [facility]- copay amounts may vary at other pharmacies due to pharmacy/plan contracts, or as the patient moves through the different stages of their insurance plan.

## 2024-12-03 DIAGNOSIS — E875 Hyperkalemia: Secondary | ICD-10-CM | POA: Diagnosis not present

## 2024-12-03 DIAGNOSIS — R410 Disorientation, unspecified: Secondary | ICD-10-CM

## 2024-12-03 DIAGNOSIS — I4892 Unspecified atrial flutter: Secondary | ICD-10-CM | POA: Diagnosis not present

## 2024-12-03 LAB — MAGNESIUM: Magnesium: 2.3 mg/dL (ref 1.7–2.4)

## 2024-12-03 LAB — BASIC METABOLIC PANEL WITH GFR
Anion gap: 16 — ABNORMAL HIGH (ref 5–15)
BUN: 54 mg/dL — ABNORMAL HIGH (ref 8–23)
CO2: 32 mmol/L (ref 22–32)
Calcium: 8.6 mg/dL — ABNORMAL LOW (ref 8.9–10.3)
Chloride: 98 mmol/L (ref 98–111)
Creatinine, Ser: 2.17 mg/dL — ABNORMAL HIGH (ref 0.61–1.24)
GFR, Estimated: 29 mL/min — ABNORMAL LOW
Glucose, Bld: 263 mg/dL — ABNORMAL HIGH (ref 70–99)
Potassium: 4.1 mmol/L (ref 3.5–5.1)
Sodium: 146 mmol/L — ABNORMAL HIGH (ref 135–145)

## 2024-12-03 LAB — CBC
HCT: 48.6 % (ref 39.0–52.0)
Hemoglobin: 14.8 g/dL (ref 13.0–17.0)
MCH: 26.6 pg (ref 26.0–34.0)
MCHC: 30.5 g/dL (ref 30.0–36.0)
MCV: 87.4 fL (ref 80.0–100.0)
Platelets: 42 10*3/uL — ABNORMAL LOW (ref 150–400)
RBC: 5.56 MIL/uL (ref 4.22–5.81)
RDW: 18.8 % — ABNORMAL HIGH (ref 11.5–15.5)
WBC: 7.3 10*3/uL (ref 4.0–10.5)
nRBC: 0.3 % — ABNORMAL HIGH (ref 0.0–0.2)

## 2024-12-03 MED ORDER — DILTIAZEM HCL ER COATED BEADS 180 MG PO CP24
360.0000 mg | ORAL_CAPSULE | Freq: Every day | ORAL | Status: AC
  Administered 2024-12-03 – 2024-12-06 (×4): 360 mg via ORAL
  Filled 2024-12-03 (×4): qty 2

## 2024-12-03 MED ORDER — PREDNISONE 5 MG PO TABS: 5.0000 mg | ORAL_TABLET | Freq: Every day | ORAL | Status: DC

## 2024-12-03 MED ORDER — AMIODARONE HCL 200 MG PO TABS
400.0000 mg | ORAL_TABLET | Freq: Two times a day (BID) | ORAL | Status: DC
  Administered 2024-12-03 – 2024-12-04 (×3): 400 mg via ORAL
  Filled 2024-12-03 (×3): qty 2

## 2024-12-03 MED ORDER — PREDNISONE 5 MG PO TABS
5.0000 mg | ORAL_TABLET | Freq: Every day | ORAL | Status: AC
  Administered 2024-12-04: 5 mg via ORAL
  Filled 2024-12-03: qty 1

## 2024-12-03 MED ORDER — ALBUTEROL SULFATE (2.5 MG/3ML) 0.083% IN NEBU
2.5000 mg | INHALATION_SOLUTION | Freq: Two times a day (BID) | RESPIRATORY_TRACT | Status: DC
  Administered 2024-12-03 – 2024-12-04 (×3): 2.5 mg via RESPIRATORY_TRACT
  Filled 2024-12-03 (×3): qty 3

## 2024-12-03 MED ORDER — ENSURE PLUS HIGH PROTEIN PO LIQD
237.0000 mL | Freq: Two times a day (BID) | ORAL | Status: AC
  Administered 2024-12-03 – 2024-12-06 (×6): 237 mL via ORAL

## 2024-12-03 NOTE — Progress Notes (Incomplete)
 CCMD called 2222 to say that pt converted to Afib and had 7 beats run of vtach @2219 . I went into pt room to assess pt. Pt HR was between 122 to 138. Upon assessment, pt was aox4, denies any cp, and does not show any sign of distress. EKG was taken and

## 2024-12-03 NOTE — Progress Notes (Signed)
 Physical Therapy Treatment Patient Details Name: Scott Oneal MRN: [id number] DOB: 1941-09-24 Today's Date: 12/03/2024   History of Present Illness Patient is 84 yo male admitted on 11/30/24 for syncope, likely due to atrial flutter. PMH significant for Afib, macular degeneration, essential hypertension, hyperlipidemia, chronic diastolic heart failure with an EF of 55%, paroxysmal atrial tachycardia history of CAD status post DES, chronic thrombocytopenia of 87 back in December 2025, chronic disease stage IV discharged from [facility] 10/14/2024 for AKI.    PT Comments  Pt asleep on entry, needing increased stimulation to wake. Once awake pt not aware his bed in saturated in urine. Pt requires mod A for getting to EoB and maxA for standing and pivoting to chair. HR at rest in 90s but with mobilization raises to 150s and stays elevated for approximately 5 min until it returns to 110s. D/c plans remain appropriate. PT will continue to follow acutely.    If plan is discharge home, recommend the following: A lot of help with bathing/dressing/bathroom;Two people to help with walking and/or transfers;Assistance with cooking/housework;Direct supervision/assist for medications management;Direct supervision/assist for financial management;Supervision due to cognitive status   Can travel by private vehicle        Equipment Recommendations  Other (comment) (will continue to assess)    Recommendations for Other Services       Precautions / Restrictions Precautions Precautions: Fall Recall of Precautions/Restrictions: Impaired Restrictions Weight Bearing Restrictions Per Provider Order: No     Mobility  Bed Mobility Overal bed mobility: Needs Assistance Bed Mobility: Supine to Sit     Supine to sit: Mod assist, HOB elevated     General bed mobility comments: needs modA for pt to pull against PT to come to upright, modA for bringing  hips to EoB with bed pad    Transfers Overall transfer  level: Needs assistance Equipment used: Rolling walker (2 wheels) Transfers: Sit to/from Stand, Bed to chair/wheelchair/BSC Sit to Stand: Max assist, From elevated surface   Step pivot transfers: Max assist       General transfer comment: pt requires maxA to power up to RW and for sequencing steps over to recliner    Ambulation/Gait               General Gait Details: given max A for step pivot unable to progres to ambulation without assist         Balance Overall balance assessment: Needs assistance Sitting-balance support: Bilateral upper extremity supported, Feet supported Sitting balance-Leahy Scale: Poor Sitting balance - Comments: requires contact guard and cuing for leaning forward   Standing balance support: Reliant on assistive device for balance Standing balance-Leahy Scale: Poor Standing balance comment: needs BUE support on RW to maintain balance                            Communication Communication Communication: No apparent difficulties  Cognition Arousal: Alert Behavior During Therapy: WFL for tasks assessed/performed   PT - Cognitive impairments: No family/caregiver present to determine baseline, Safety/Judgement, Awareness, Memory                       PT - Cognition Comments: Pt confused when awaken and is unaware that his bed is saturated in urine, Following commands: Impaired Following commands impaired: Follows one step commands with increased time, Follows multi-step commands with increased time, Follows multi-step commands inconsistently    Cueing Cueing Techniques: Verbal cues, Gestural cues,  Tactile cues     General Comments General comments (skin integrity, edema, etc.): Pt HR in 90s in supine increased to 150s and is sustained for increased time even once sitting up in recliner, takes ~5 min to return to HR in 90s, >90%O2 on RA      Pertinent Vitals/Pain Pain Assessment Pain Assessment: No/denies  pain Breathing: normal Negative Vocalization: none Facial Expression: smiling or inexpressive Body Language: relaxed Consolability: no need to console PAINAD Score: 0     PT Goals (current goals can now be found in the care plan section) Acute Rehab PT Goals Patient Stated Goal: Patient stated his goal is to return home. PT Goal Formulation: With patient Time For Goal Achievement: 12/15/24 Potential to Achieve Goals: Fair Progress towards PT goals: Progressing toward goals    Frequency    Min 2X/week       AM-PAC PT 6 Clicks Mobility   Outcome Measure  Help needed turning from your back to your side while in a flat bed without using bedrails?: A Little Help needed moving from lying on your back to sitting on the side of a flat bed without using bedrails?: A Lot Help needed moving to and from a bed to a chair (including a wheelchair)?: A Lot Help needed standing up from a chair using your arms (e.g., wheelchair or bedside chair)?: A Lot Help needed to walk in hospital room?: A Lot Help needed climbing 3-5 steps with a railing? : Total 6 Click Score: 12    End of Session Equipment Utilized During Treatment: Gait belt Activity Tolerance: Patient tolerated treatment well Patient left: with call bell/phone within reach;in chair;with chair alarm set (bilateral mittens redonned.) Nurse Communication: Mobility status;Other (comment) (HR in 150s, need to have bed linens changed) PT Visit Diagnosis: Unsteadiness on feet (R26.81);Other abnormalities of gait and mobility (R26.89);Muscle weakness (generalized) (M62.81);History of falling (Z91.81);Difficulty in walking, not elsewhere classified (R26.2)     Time: 8454-8395 PT Time Calculation (min) (ACUTE ONLY): 19 min  Charges:    $Therapeutic Activity: 8-22 mins PT General Charges $$ ACUTE PT VISIT: 1 Visit                     Chyann Ambrocio B. Fleeta Lapidus PT, DPT Acute Rehabilitation Services Please use secure chat or  Call  Office [phone]    [name] 12/03/2024, 6:10 PM

## 2024-12-03 NOTE — Plan of Care (Signed)
" °  Problem: Education: Goal: Knowledge of General Education information will improve Description: Including pain rating scale, medication(s)/side effects and non-pharmacologic comfort measures Outcome: Progressing   Problem: Health Behavior/Discharge Planning: Goal: Ability to manage health-related needs will improve Outcome: Progressing   Problem: Clinical Measurements: Goal: Ability to maintain clinical measurements within normal limits will improve Outcome: Progressing   Problem: Clinical Measurements: Goal: Will remain free from infection Outcome: Progressing   Problem: Clinical Measurements: Goal: Diagnostic test results will improve Outcome: Progressing   Problem: Clinical Measurements: Goal: Respiratory complications will improve Outcome: Progressing   Problem: Activity: Goal: Risk for activity intolerance will decrease Outcome: Progressing   Problem: Safety: Goal: Ability to remain free from injury will improve Outcome: Progressing   Problem: Skin Integrity: Goal: Risk for impaired skin integrity will decrease Outcome: Progressing   "

## 2024-12-03 NOTE — Plan of Care (Signed)
   Problem: Education: Goal: Knowledge of General Education information will improve Description: Including pain rating scale, medication(s)/side effects and non-pharmacologic comfort measures Outcome: Progressing   Problem: Health Behavior/Discharge Planning: Goal: Ability to manage health-related needs will improve Outcome: Progressing   Problem: Clinical Measurements: Goal: Ability to maintain clinical measurements within normal limits will improve Outcome: Progressing Goal: Will remain free from infection Outcome: Progressing Goal: Diagnostic test results will improve Outcome: Progressing Goal: Respiratory complications will improve Outcome: Progressing Goal: Cardiovascular complication will be avoided Outcome: Progressing   Problem: Nutrition: Goal: Adequate nutrition will be maintained Outcome: Progressing   Problem: Activity: Goal: Risk for activity intolerance will decrease Outcome: Progressing

## 2024-12-03 NOTE — Progress Notes (Signed)
 CCMD called 2222 to say that pt converted to Afib and had 7 beats run of vtach @2219 . I went into pt room to assess pt. Pt HR was between 122 to 138. Upon assessment, pt was aox4, denies any cp, and does not show any sign of distress. EKG was taken and a copy was added to pt chat. Evalene Sprinkles, MD was notified.

## 2024-12-04 ENCOUNTER — Ambulatory Visit: Admitting: Internal Medicine

## 2024-12-04 LAB — BASIC METABOLIC PANEL WITH GFR
Anion gap: 8 (ref 5–15)
Anion gap: 9 (ref 5–15)
BUN: 51 mg/dL — ABNORMAL HIGH (ref 8–23)
BUN: 49 mg/dL — ABNORMAL HIGH (ref 8–23)
CO2: 36 mmol/L — ABNORMAL HIGH (ref 22–32)
CO2: 34 mmol/L — ABNORMAL HIGH (ref 22–32)
Calcium: 9 mg/dL (ref 8.9–10.3)
Calcium: 8.8 mg/dL — ABNORMAL LOW (ref 8.9–10.3)
Chloride: 104 mmol/L (ref 98–111)
Chloride: 100 mmol/L (ref 98–111)
Creatinine, Ser: 2.12 mg/dL — ABNORMAL HIGH (ref 0.61–1.24)
Creatinine, Ser: 2.11 mg/dL — ABNORMAL HIGH (ref 0.61–1.24)
GFR, Estimated: 30 mL/min — ABNORMAL LOW
GFR, Estimated: 30 mL/min — ABNORMAL LOW
Glucose, Bld: 139 mg/dL — ABNORMAL HIGH (ref 70–99)
Glucose, Bld: 220 mg/dL — ABNORMAL HIGH (ref 70–99)
Potassium: 4 mmol/L (ref 3.5–5.1)
Potassium: 4 mmol/L (ref 3.5–5.1)
Sodium: 148 mmol/L — ABNORMAL HIGH (ref 135–145)
Sodium: 143 mmol/L (ref 135–145)

## 2024-12-04 LAB — CBC
HCT: 45.1 % (ref 39.0–52.0)
Hemoglobin: 14.1 g/dL (ref 13.0–17.0)
MCH: 27.2 pg (ref 26.0–34.0)
MCHC: 31.3 g/dL (ref 30.0–36.0)
MCV: 86.9 fL (ref 80.0–100.0)
Platelets: 40 10*3/uL — ABNORMAL LOW (ref 150–400)
RBC: 5.19 MIL/uL (ref 4.22–5.81)
RDW: 18.5 % — ABNORMAL HIGH (ref 11.5–15.5)
WBC: 6.1 10*3/uL (ref 4.0–10.5)
nRBC: 0 % (ref 0.0–0.2)

## 2024-12-04 LAB — MAGNESIUM: Magnesium: 2.1 mg/dL (ref 1.7–2.4)

## 2024-12-04 MED ORDER — AMIODARONE HCL 200 MG PO TABS: 200.0000 mg | ORAL_TABLET | Freq: Every day | ORAL | Status: AC

## 2024-12-04 MED ORDER — AMIODARONE HCL 200 MG PO TABS
400.0000 mg | ORAL_TABLET | Freq: Two times a day (BID) | ORAL | Status: AC
  Administered 2024-12-04 – 2024-12-06 (×5): 400 mg via ORAL
  Filled 2024-12-04 (×5): qty 2

## 2024-12-04 NOTE — Telephone Encounter (Signed)
 Pt is currently in the hospital.

## 2024-12-04 NOTE — Progress Notes (Signed)
 " PROGRESS NOTE    Scott Oneal  FMW:[id number] DOB: [birth year] DOA: 11/30/2024 PCP: Garald Karlynn GAILS, MD   Brief Narrative:  Scott Oneal is an 84 y.o. male past medical history significant for essential hypertension, hyperlipidemia, chronic  diastolic heart failure with an EF of 55%, paroxysmal atrial tachycardia,CAD status post DES, chronic thrombocytopenia(80s), chronic kidney disease stage IV discharged from [facility] 10/14/2024 for acute kidney injury on comes in after an episode of loss of consciousness after standing from a fall.  Assessment & Plan:   Principal Problem:   Atrial flutter with rapid ventricular response (HCC) Active Problems:   Fall at home, initial encounter   Asthmatic bronchitis   Heart failure with preserved ejection fraction (HCC)   Hyperkalemia   CKD (chronic kidney disease) stage 4, GFR 15-29 ml/min (HCC)   Thrombocytopenia   Hypothyroidism   Delirium  Syncope likely due to hypotension brought on by tachycardia/atrial flutter/atrial fibrillation - On max dose diltiazem  - Eliquis  - IV amiodarone  transitioning to PO per cardiology 2D echo was done that showed an EF of 55% no regional wall motion abnormality.   Acute on chronic thrombocytopenia: Currently on Eliquis  no signs of overt bleeding, noted bruises on his arms. Hemoglobin stable at 14(his baseline). Platelets have stabilized around 40 There is no previous exposure to heparin  seems to be new since last year.   Acute bronchitis, resolving Symptoms of cough/wheeze appear to be resolving. Chest x-ray showed no acute abnormality. Continue steroid taper.   Acute confusional state: Keep the blinds open during day close at night. Seems to be resolved.   HFrEF: 2D echo is unchanged from previous. Continue torsemide  strict I's and O's and daily weights. He is becoming hypernatremic and hyperchloremic.   Hyperkalemia: Resolved Hypovolemic hypernatremia  - Increase free water   intake  Chronic pancreatic insufficiency: Continue Creon .   CKD (chronic kidney disease) stage 4, GFR 15-29 ml/min (HCC) Baseline usually ranges around 2.4-2.8; currently at baseline Continue calcitriol .   Hypothyroidism Continue Synthroid .   DVT prophylaxis: apixaban  (ELIQUIS ) tablet 2.5 mg Start: 12/01/24 1000 SCDs Start: 11/30/24 1055 apixaban  (ELIQUIS ) tablet 2.5 mg   Code Status:   Code Status: Full Code  Family Communication: None present at bedside  Status is: Inpatient  Dispo: The patient is from: Home              Anticipated d/c is to: SNF              Anticipated d/c date is: 24 to 48 hours              Patient currently is medically stable for discharge  Consultants:  Cardiology  Procedures:  None  Antimicrobials:  None indicated  Subjective: No acute issues or events overnight denies nausea vomiting diarrhea constipation headache fevers chills or chest pain.  Ambulating today without tachycardia dyspnea or symptoms  Objective: Vitals:   12/04/24 0414 12/04/24 0715 12/04/24 0720 12/04/24 0725  BP: 138/76     Pulse:  (!) 40 (!) 121 (!) 143  Resp: 20     Temp: 98.8 F (37.1 C)     TempSrc: Axillary     SpO2: 98% (!) 88% 91% 92%  Weight:      Height:        Intake/Output Summary (Last 24 hours) at 12/04/2024 0757 Last data filed at 12/04/2024 0121 Gross per 24 hour  Intake 1745.84 ml  Output 1900 ml  Net -154.16 ml   [employer]   12/01/24  1427  Weight: 89.4 kg    Examination:  General:  Pleasantly resting in bed, No acute distress. HEENT:  Normocephalic atraumatic.  Sclerae nonicteric, noninjected.  Extraocular movements intact bilaterally. Neck:  Without mass or deformity.  Trachea is midline. Lungs:  Clear to auscultate bilaterally without rhonchi, wheeze, or rales. Heart: Irregularly irregular without murmurs, rubs, or gallops. Abdomen:  Soft, nontender, nondistended.  Without guarding or rebound. Extremities: Without cyanosis,  clubbing, edema, or obvious deformity. Skin:  Warm and dry, no erythema.  Data Reviewed: I have personally reviewed following labs and imaging studies  CBC: Recent Labs  Lab 11/30/24 2252 12/01/24 0227 12/02/24 0242 12/03/24 1006 12/04/24 0431  WBC 12.0* 8.6 10.9* 7.3 6.1  NEUTROABS 10.9*  --   --   --   --   HGB 13.0 12.5* 13.1 14.8 14.1  HCT 42.2 40.0 41.8 48.6 45.1  MCV 86.8 86.0 85.7 87.4 86.9  PLT 47* 36* 47* 42* 40*   Basic Metabolic Panel: Recent Labs  Lab 11/30/24 0640 12/01/24 0227 12/02/24 0242 12/03/24 0448 12/04/24 0431  NA 142 141 144 146* 148*  K 5.2* 5.1 4.7 4.1 4.0  CL 104 100 101 98 104  CO2 26 26 27  32 36*  GLUCOSE 95 186* 112* 263* 139*  BUN 86* 80* 70* 54* 51*  CREATININE 2.69* 2.55* 2.42* 2.17* 2.12*  CALCIUM  8.4* 7.8* 8.5* 8.6* 9.0  MG 2.4  --  2.3 2.3 2.1   GFR: Estimated Creatinine Clearance: 29 mL/min (A) (by C-G formula based on SCr of 2.12 mg/dL (H)). Liver Function Tests: Recent Labs  Lab 11/30/24 0640  AST 27  ALT 26  ALKPHOS 62  BILITOT 1.0  PROT 5.3*  ALBUMIN 3.2*   No results for input(s): LIPASE, AMYLASE in the last 168 hours. No results for input(s): AMMONIA in the last 168 hours. Coagulation Profile: Recent Labs  Lab 11/30/24 0703  INR 1.1   Cardiac Enzymes: No results for input(s): CKTOTAL, CKMB, CKMBINDEX, TROPONINI in the last 168 hours. BNP (last 3 results) No results for input(s): PROBNP in the last 8760 hours. HbA1C: No results for input(s): HGBA1C in the last 72 hours. CBG: No results for input(s): GLUCAP in the last 168 hours. Lipid Profile: No results for input(s): CHOL, HDL, LDLCALC, TRIG, CHOLHDL, LDLDIRECT in the last 72 hours. Thyroid  Function Tests: No results for input(s): TSH, T4TOTAL, FREET4, T3FREE, THYROIDAB in the last 72 hours. Anemia Panel: No results for input(s): VITAMINB12, FOLATE, FERRITIN, TIBC, IRON, RETICCTPCT in the last 72  hours. Sepsis Labs: No results for input(s): PROCALCITON, LATICACIDVEN in the last 168 hours.  No results found for this or any previous visit (from the past 240 hours).       Radiology Studies: No results found.      Scheduled Meds:  albuterol   2.5 mg Nebulization BID   amiodarone   400 mg Oral BID   apixaban   2.5 mg Oral BID   calcitRIOL   0.25 mcg Oral q AM   diltiazem   360 mg Oral Daily   feeding supplement  237 mL Oral BID BM   gabapentin   300 mg Oral BID   levothyroxine   175 mcg Oral Daily   lipase/protease/amylase  72,000 Units Oral TID AC   melatonin  3 mg Oral QHS   mupirocin  ointment  1 Application Topical Daily   pantoprazole   40 mg Oral BID   predniSONE   5 mg Oral Q breakfast   sodium chloride  flush  3 mL Intravenous Q12H  torsemide   40 mg Oral Daily   Continuous Infusions:   LOS: 2 days   Time spent:  Elsie JAYSON Montclair, DO [facility]  If 7PM-7AM, please contact night-coverage [email or web address]  12/04/2024, 7:57 AM      "

## 2024-12-04 NOTE — TOC Progression Note (Signed)
 Transition of Care [facility]) - Progression Note    Patient Details  Name: Scott Oneal MRN: [id number] Date of Birth: 1940-11-26  [facility]) CM/SW Contact  Isaiah Public, LCSWA Phone Number: 12/04/2024, 10:39 AM  Clinical Narrative:     Dufm with [facility] confirmed facility will have SNF bed for patient tomorrow if medically ready. CSW called [employer] and spoke with Jori and request for insurance to be started for SNF and PTAR.   Patient has SNF bed at [facility]. Insurance authorization for SNF and PTAR currently pending.                    Expected Discharge Plan and Services                                               Social Drivers of Health (SDOH) Interventions SDOH Screenings   Food Insecurity: No Food Insecurity (11/30/2024)  Housing: Low Risk (11/30/2024)  Transportation Needs: No Transportation Needs (11/30/2024)  Utilities: Not At Risk (11/30/2024)  Alcohol  Screen: Low Risk (01/25/2024)  Depression (PHQ2-9): Low Risk (11/28/2024)  Financial Resource Strain: Low Risk (01/25/2024)  Physical Activity: Inactive (01/25/2024)  Social Connections: Moderately Isolated (11/30/2024)  Stress: No Stress Concern Present (01/25/2024)  Tobacco Use: Low Risk (12/01/2024)  Health Literacy: Adequate Health Literacy (01/25/2024)    Readmission Risk Interventions     No data to display

## 2024-12-04 NOTE — Plan of Care (Signed)
" °  Problem: Education: Goal: Knowledge of General Education information will improve Description: Including pain rating scale, medication(s)/side effects and non-pharmacologic comfort measures Outcome: Progressing   Problem: Health Behavior/Discharge Planning: Goal: Ability to manage health-related needs will improve Outcome: Progressing   Problem: Clinical Measurements: Goal: Will remain free from infection Outcome: Progressing   Problem: Clinical Measurements: Goal: Diagnostic test results will improve Outcome: Progressing   Problem: Clinical Measurements: Goal: Respiratory complications will improve Outcome: Progressing   Problem: Activity: Goal: Risk for activity intolerance will decrease Outcome: Progressing   Problem: Elimination: Goal: Will not experience complications related to bowel motility Outcome: Progressing   Problem: Safety: Goal: Ability to remain free from injury will improve Outcome: Progressing   Problem: Skin Integrity: Goal: Risk for impaired skin integrity will decrease Outcome: Progressing   Problem: Safety: Goal: Ability to remain free from injury will improve Outcome: Progressing   "

## 2024-12-04 NOTE — Progress Notes (Addendum)
 "  Progress Note  Patient Name: Scott Oneal Date of Encounter: 12/04/2024 [facility] Cardiologist: Ozell Fell, MD   Interval Summary    Feeling well this morning. Mentation significantly improved.   Vital Signs Vitals:   12/04/24 0715 12/04/24 0720 12/04/24 0725 12/04/24 0758  BP:      Pulse: (!) 40 (!) 121 (!) 143 99  Resp:    18  Temp:      TempSrc:      SpO2: (!) 88% 91% 92% 92%  Weight:      Height:        Intake/Output Summary (Last 24 hours) at 12/04/2024 1014 Last data filed at 12/04/2024 0121 Gross per 24 hour  Intake 1745.84 ml  Output 1900 ml  Net -154.16 ml      12/01/2024    2:27 PM 11/28/2024    2:35 PM 11/14/2024    2:39 PM  Last 3 Weights  Weight (lbs) 197 lb 197 lb 195 lb 6.4 oz  Weight (kg) 89.359 kg 89.359 kg 88.633 kg      Telemetry/ECG   Sinus Rhythm with freq intermittent break through atrial fibrillation, currently in sinus rhythm - Personally Reviewed  Physical Exam  GEN: No acute distress.   Neck: No JVD Cardiac: RRR, no murmurs, rubs, or gallops.  Respiratory: Clear to auscultation bilaterally. GI: Soft, nontender, non-distended  MS: No edema  Assessment & Plan   84 y.o. male with PMH of DVT, CAD s/p PCI of LCx, chronic systolic CHF, HTN, HLD, frequent PACs and atrial tachycardia who presented with a syncopal event and found to be in aflutter with RVR    Atrial flutter with RVR -- underwent ED cardioversion 11/30/2024 -- continues to have brief episodes of breakthrough afib this morning, but currently in sinus rhythm.  -- Echo 12/01/2024 showed EF 55-60%, no RWMA, RVSP 36.1 mmHg, mild MS -- continue on Eliquis  2.5mg  BID, continue Cardizem  to 360mg  daily -- continue amiodarone  400mg  BID x10, then drop to 200mg  daily   Syncope -- unclear cause, reportedly stood up and passed out though unclear events. Could have been 2/2 atrial fibrillation or hypotension   CAD -- inferior MI 2005 treated with overlapping DES to RCA, staged  PCI of LCx -- cath 01/31/2020 patent stable with moderate ISR, medical therapy   Hypertension -- stable today, as above    Thrombocytopenia -- platelets 40 today, following closely with the need for Eliquis  and recent cardioversion    Mitral stenosis -- echo 2/1 noted mild mitral stenosis with mean mitral valve gradient is 5.0 mmHg, severe mitral annular calcification   Per Primary CKD stage III H/o LLE DVT in 05/2022 s/p mechanical thrombectomy  HLD Confusion  For questions or updates, please contact [facility] Please consult [email or web address] for contact info under      Signed, Manuelita Rummer, NP    ATTENDING ATTESTATION  I have seen, examined and evaluated the patient this morning on rounds along with Manuelita Rummer, NP-C.  After reviewing all the available data and chart, we discussed the patients laboratory, study & physical findings as well as symptoms in detail.  I agree with her findings, examination as well as impression recommendations as per our discussion.    Attending adjustments noted in italics.   More awake and alert today.  Still having intermittent ups and downs of his heart rate.  Plan to be to continue with oral amiodarone  load for 10 days and then drop to 200 mg daily.  Diltiazem  dose consolidated to 360 mg.  Monitor for bradycardia-would reduce if necessary. He is on Eliquis  at reduced dose-however need to monitor his platelet levels.SABRA Alm MICAEL Anner, MD, MS Alm Anner, M.D., M.S. Interventional Cardiologist  [facility] Pager # [phone]     "

## 2024-12-04 NOTE — Telephone Encounter (Signed)
 Paperwork has been faxed over [employer]

## 2024-12-05 LAB — CBC
HCT: 42.6 % (ref 39.0–52.0)
Hemoglobin: 13.1 g/dL (ref 13.0–17.0)
MCH: 26.6 pg (ref 26.0–34.0)
MCHC: 30.8 g/dL (ref 30.0–36.0)
MCV: 86.6 fL (ref 80.0–100.0)
Platelets: 38 10*3/uL — ABNORMAL LOW (ref 150–400)
RBC: 4.92 MIL/uL (ref 4.22–5.81)
RDW: 18.5 % — ABNORMAL HIGH (ref 11.5–15.5)
WBC: 6.2 10*3/uL (ref 4.0–10.5)
nRBC: 0 % (ref 0.0–0.2)

## 2024-12-05 NOTE — Progress Notes (Signed)
" ° ° °  Patient continued to be in A-fib with Brabham rates sometimes in the 80s and as high as the 110S.  He remains on high dose diltiazem  as well as amiodarone .  He is somewhat oblivious to being in A-fib at this rate.  I think at this point we do not have too much more to offer besides continuing the amiodarone  load as discussed in previous notes.  We will continue to monitor over a distance.   Alm Clay, MD  "

## 2024-12-05 NOTE — Progress Notes (Signed)
 Physical Therapy Treatment Patient Details Name: Scott Oneal MRN: [id number] DOB: 1940/12/22 Today's Date: 12/05/2024   History of Present Illness Patient is 84 yo male admitted on 11/30/24 for syncope, likely due to atrial flutter. PMH significant for Afib, macular degeneration, essential hypertension, hyperlipidemia, chronic diastolic heart failure with an EF of 55%, paroxysmal atrial tachycardia history of CAD status post DES, chronic thrombocytopenia of 87 back in December 2025, chronic disease stage IV discharged from [facility] 10/14/2024 for AKI.    PT Comments  Pt received in supine and agreeable to PT session. Pt presents with impaired balance, decreased strength, and limited activity tolerance. Pt required ModA for sit<>stand transfers with use of RW. Able to progress in today's session by ambulating 51ft forwards/backwards with MinA. Pt had decreased safety awareness and insight into deficits with conversation around the need for further rehab. Continue to recommend <3hrs post acute rehab with acute PT to follow.     If plan is discharge home, recommend the following: A lot of help with bathing/dressing/bathroom;Two people to help with walking and/or transfers;Assistance with cooking/housework;Direct supervision/assist for medications management;Direct supervision/assist for financial management;Supervision due to cognitive status   Can travel by private vehicle     No  Equipment Recommendations  None recommended by PT       Precautions / Restrictions Precautions Precautions: Fall Recall of Precautions/Restrictions: Impaired Restrictions Weight Bearing Restrictions Per Provider Order: No     Mobility  Bed Mobility Overal bed mobility: Needs Assistance Bed Mobility: Supine to Sit     Supine to sit: Mod assist, HOB elevated     General bed mobility comments: ModA for trunk raise and to scoot hips forward to EOB    Transfers Overall transfer level: Needs  assistance Equipment used: Rolling walker (2 wheels) Transfers: Sit to/from Stand, Bed to chair/wheelchair/BSC Sit to Stand: Mod assist   Step pivot transfers: Min assist       General transfer comment: ModA to boost-up with cues for hand placement. X3 sit<>stand transfers. Able to then step pivot with MinA    Ambulation/Gait Ambulation/Gait assistance: Min assist Gait Distance (Feet): 8 Feet Assistive device: Rolling walker (2 wheels) Gait Pattern/deviations: Step-through pattern, Decreased stride length, Trunk flexed, Shuffle Gait velocity: decr     General Gait Details: slightly forward flexed trunk with shuffling steps. Able to ambulate forwards/backwards     Balance Overall balance assessment: Needs assistance Sitting-balance support: No upper extremity supported, Feet supported Sitting balance-Leahy Scale: Fair     Standing balance support: Bilateral upper extremity supported, During functional activity, Reliant on assistive device for balance Standing balance-Leahy Scale: Poor Standing balance comment: needs BUE support on RW to maintain balance       Communication Communication Communication: No apparent difficulties Factors Affecting Communication: Reduced clarity of speech  Cognition Arousal: Alert Behavior During Therapy: WFL for tasks assessed/performed   PT - Cognitive impairments: No family/caregiver present to determine baseline, Safety/Judgement, Awareness, Memory    Following commands: Impaired Following commands impaired: Follows one step commands with increased time, Follows multi-step commands with increased time, Follows multi-step commands inconsistently    Cueing Cueing Techniques: Verbal cues, Gestural cues, Tactile cues  Exercises General Exercises - Lower Extremity Long Arc Quad: AROM, Both, 10 reps, Seated Hip Flexion/Marching: AROM, Both, 10 reps, Standing        Pertinent Vitals/Pain Pain Assessment Pain Assessment: No/denies pain      PT Goals (current goals can now be found in the care plan section) Acute Rehab PT  Goals Patient Stated Goal: to be with his wife PT Goal Formulation: With patient Time For Goal Achievement: 12/15/24 Potential to Achieve Goals: Fair Progress towards PT goals: Progressing toward goals    Frequency    Min 2X/week       AM-PAC PT 6 Clicks Mobility   Outcome Measure  Help needed turning from your back to your side while in a flat bed without using bedrails?: A Little Help needed moving from lying on your back to sitting on the side of a flat bed without using bedrails?: A Lot Help needed moving to and from a bed to a chair (including a wheelchair)?: A Lot Help needed standing up from a chair using your arms (e.g., wheelchair or bedside chair)?: A Lot Help needed to walk in hospital room?: A Lot Help needed climbing 3-5 steps with a railing? : Total 6 Click Score: 12    End of Session   Activity Tolerance: Patient tolerated treatment well Patient left: in chair;with call bell/phone within reach;with chair alarm set Nurse Communication: Mobility status PT Visit Diagnosis: Unsteadiness on feet (R26.81);Other abnormalities of gait and mobility (R26.89);Muscle weakness (generalized) (M62.81);History of falling (Z91.81);Difficulty in walking, not elsewhere classified (R26.2)     Time: 9195-9169 PT Time Calculation (min) (ACUTE ONLY): 26 min  Charges:    $Gait Training: 8-22 mins $Therapeutic Exercise: 8-22 mins PT General Charges $$ ACUTE PT VISIT: 1 Visit                    Scott Oneal, PT, DPT Secure Chat Preferred  Rehab Office [phone]    Scott Oneal [name] 12/05/2024, 9:38 AM

## 2024-12-05 NOTE — TOC Progression Note (Addendum)
 Transition of Care [facility]) - Progression Note    Patient Details  Name: Scott Oneal MRN: [id number] Date of Birth: 03/14/1941  [facility] of [facility]) CM/SW Contact  Isaiah Public, LCSWA Phone Number: 12/05/2024, 10:24 AM  Clinical Narrative:        CSW received a call from Debbie with [employer] who informed CSW that patients insurance authorization for SNF has went to a peer to peer with Dr. Janit tele# [phone]. Peer to peer is due by tomorrow 2/6 at 10:00am. CSW informed MD.Reference number for SNF # [id number]. PTAR has been approved # [id number].  CSW will continue to follow.   Update- Debbie with [employer] informed CSW that patients insurance authorization was denied for SNF. CSW awaiting for denial letter to provide to patients family. Update- CSW informed patients son [name] . [name] plans on appealing. CSW will provide patients son [name] with denial letter once received.                Expected Discharge Plan and Services                                               Social Drivers of Health (SDOH) Interventions SDOH Screenings   Food Insecurity: No Food Insecurity (11/30/2024)  Housing: Low Risk (11/30/2024)  Transportation Needs: No Transportation Needs (11/30/2024)  Utilities: Not At Risk (11/30/2024)  Alcohol  Screen: Low Risk (01/25/2024)  Depression (PHQ2-9): Low Risk (11/28/2024)  Financial Resource Strain: Low Risk (01/25/2024)  Physical Activity: Inactive (01/25/2024)  Social Connections: Moderately Isolated (11/30/2024)  Stress: No Stress Concern Present (01/25/2024)  Tobacco Use: Low Risk (12/01/2024)  Health Literacy: Adequate Health Literacy (01/25/2024)    Readmission Risk Interventions     No data to display

## 2024-12-05 NOTE — Plan of Care (Signed)
" °  Problem: Education: Goal: Knowledge of General Education information will improve Description: Including pain rating scale, medication(s)/side effects and non-pharmacologic comfort measures Outcome: Progressing   Problem: Clinical Measurements: Goal: Ability to maintain clinical measurements within normal limits will improve Outcome: Progressing   Problem: Clinical Measurements: Goal: Will remain free from infection Outcome: Progressing   Problem: Clinical Measurements: Goal: Diagnostic test results will improve Outcome: Progressing   Problem: Clinical Measurements: Goal: Respiratory complications will improve Outcome: Progressing   Problem: Activity: Goal: Risk for activity intolerance will decrease Outcome: Progressing   Problem: Pain Managment: Goal: General experience of comfort will improve and/or be controlled Outcome: Progressing   Problem: Safety: Goal: Ability to remain free from injury will improve Outcome: Progressing   Problem: Skin Integrity: Goal: Risk for impaired skin integrity will decrease Outcome: Progressing   "

## 2024-12-05 NOTE — Progress Notes (Signed)
 " PROGRESS NOTE    Scott Oneal  FMW:[id number] DOB: 04-07-1941 DOA: 11/30/2024 PCP: Garald Karlynn GAILS, MD   Brief Narrative:  Scott Oneal is an 84 y.o. male past medical history significant for essential hypertension, hyperlipidemia, chronic  diastolic heart failure with an EF of 55%, paroxysmal atrial tachycardia,CAD status post DES, chronic thrombocytopenia(80s), chronic kidney disease stage IV discharged from [facility] 10/14/2024 for acute kidney injury on comes in after an episode of loss of consciousness after standing from a fall.  Assessment & Plan:   Principal Problem:   Atrial flutter with rapid ventricular response (HCC) Active Problems:   Fall at home, initial encounter   Asthmatic bronchitis   Heart failure with preserved ejection fraction (HCC)   Hyperkalemia   CKD (chronic kidney disease) stage 4, GFR 15-29 ml/min (HCC)   Thrombocytopenia   Hypothyroidism   Delirium  Syncope likely due to hypotension brought on by tachycardia/atrial flutter/atrial fibrillation - On max dose diltiazem  - Eliquis  ongoing - low dose to hold given thrombocytopenia as below - IV amiodarone  transitioning to PO per cardiology - 2D echo was done that showed an EF of 55% no regional wall motion abnormality.   Acute on chronic thrombocytopenia: - Currently on Eliquis  no signs of overt bleeding, noted bruises on his arms. - Hemoglobin stable at 14 (his baseline). - Platelets have stabilized around 40 - There is no previous exposure to heparin  seems to be new since last year.   Acute ambulatory dysfunction - Baseline does not use walker/cane at home - PT currently recommending SNF  Acute bronchitis, resolving - Symptoms of cough/wheeze appear to be resolving. - Chest x-ray showed no acute abnormality. - Continue steroid taper.   Acute confusional state, rule out sundowning - Keep the blinds open during day close at night. - Seems to be resolved.   HFrEF: - 2D echo is unchanged  from previous. - Continue torsemide  strict I's and O's and daily weights. - He is becoming hypernatremic and hyperchloremic.   Hyperkalemia: Resolved Hypovolemic hypernatremia  - Increase free water  intake  Chronic pancreatic insufficiency: - Continue Creon .   CKD (chronic kidney disease) stage 4, GFR 15-29 ml/min (HCC) - Baseline usually ranges around 2.4-2.8; currently at baseline - Continue calcitriol .   Hypothyroidism - Continue Synthroid .  DVT prophylaxis: apixaban  (ELIQUIS ) tablet 2.5 mg Start: 12/01/24 1000 SCDs Start: 11/30/24 1055 apixaban  (ELIQUIS ) tablet 2.5 mg   Code Status:   Code Status: Full Code  Family Communication: None present at bedside  Status is: Inpatient  Dispo: The patient is from: Home              Anticipated d/c is to: SNF - pending peer review for authorization              Anticipated d/c date is: 24 to 48 hours              Patient currently is medically stable for discharge  Consultants:  Cardiology  Procedures:  None  Antimicrobials:  None indicated  Subjective: No acute issues or events overnight denies nausea vomiting diarrhea constipation headache fevers chills or chest pain.  Ambulating today without tachycardia dyspnea or symptoms.  Objective: Vitals:   12/04/24 1656 12/04/24 2002 12/04/24 2306 12/05/24 0338  BP: (!) 123/90 119/76 (!) 145/71 130/72  Pulse: 60 85 77 77  Resp: 16 18 16 18   Temp: 98.4 F (36.9 C) 98 F (36.7 C) 98.4 F (36.9 C) 98 F (36.7 C)  TempSrc: Oral Oral  Oral Oral  SpO2: 94% 98% 96% 92%  Weight:      Height:        Intake/Output Summary (Last 24 hours) at 12/05/2024 0825 Last data filed at 12/05/2024 0340 Gross per 24 hour  Intake 120 ml  Output 1720 ml  Net -1600 ml   Filed Weights   12/01/24 1427  Weight: 89.4 kg    Examination:  General:  Pleasantly resting in bed, No acute distress. HEENT:  Normocephalic atraumatic.  Sclerae nonicteric, noninjected.  Extraocular movements intact  bilaterally. Neck:  Without mass or deformity.  Trachea is midline. Lungs:  Clear to auscultate bilaterally without rhonchi, wheeze, or rales. Heart: Irregularly irregular without murmurs, rubs, or gallops. Abdomen:  Soft, nontender, nondistended.  Without guarding or rebound. Extremities: Without cyanosis, clubbing, edema, or obvious deformity. Skin:  Warm and dry, no erythema.  Data Reviewed: I have personally reviewed following labs and imaging studies  CBC: Recent Labs  Lab 11/30/24 2252 12/01/24 0227 12/02/24 0242 12/03/24 1006 12/04/24 0431 12/05/24 0420  WBC 12.0* 8.6 10.9* 7.3 6.1 6.2  NEUTROABS 10.9*  --   --   --   --   --   HGB 13.0 12.5* 13.1 14.8 14.1 13.1  HCT 42.2 40.0 41.8 48.6 45.1 42.6  MCV 86.8 86.0 85.7 87.4 86.9 86.6  PLT 47* 36* 47* 42* 40* 38*   Basic Metabolic Panel: Recent Labs  Lab 11/30/24 0640 12/01/24 0227 12/02/24 0242 12/03/24 0448 12/04/24 0431 12/04/24 1109  NA 142 141 144 146* 148* 143  K 5.2* 5.1 4.7 4.1 4.0 4.0  CL 104 100 101 98 104 100  CO2 26 26 27  32 36* 34*  GLUCOSE 95 186* 112* 263* 139* 220*  BUN 86* 80* 70* 54* 51* 49*  CREATININE 2.69* 2.55* 2.42* 2.17* 2.12* 2.11*  CALCIUM  8.4* 7.8* 8.5* 8.6* 9.0 8.8*  MG 2.4  --  2.3 2.3 2.1  --    GFR: Estimated Creatinine Clearance: 29.1 mL/min (A) (by C-G formula based on SCr of 2.11 mg/dL (H)). Liver Function Tests: Recent Labs  Lab 11/30/24 0640  AST 27  ALT 26  ALKPHOS 62  BILITOT 1.0  PROT 5.3*  ALBUMIN 3.2*   No results for input(s): LIPASE, AMYLASE in the last 168 hours. No results for input(s): AMMONIA in the last 168 hours. Coagulation Profile: Recent Labs  Lab 11/30/24 0703  INR 1.1   Cardiac Enzymes: No results for input(s): CKTOTAL, CKMB, CKMBINDEX, TROPONINI in the last 168 hours. BNP (last 3 results) No results for input(s): PROBNP in the last 8760 hours. HbA1C: No results for input(s): HGBA1C in the last 72 hours. CBG: No results  for input(s): GLUCAP in the last 168 hours. Lipid Profile: No results for input(s): CHOL, HDL, LDLCALC, TRIG, CHOLHDL, LDLDIRECT in the last 72 hours. Thyroid  Function Tests: No results for input(s): TSH, T4TOTAL, FREET4, T3FREE, THYROIDAB in the last 72 hours. Anemia Panel: No results for input(s): VITAMINB12, FOLATE, FERRITIN, TIBC, IRON, RETICCTPCT in the last 72 hours. Sepsis Labs: No results for input(s): PROCALCITON, LATICACIDVEN in the last 168 hours.  No results found for this or any previous visit (from the past 240 hours).       Radiology Studies: No results found.      Scheduled Meds:  amiodarone   400 mg Oral BID   Followed by   NOREEN ON 12/15/2024] amiodarone   200 mg Oral Daily   apixaban   2.5 mg Oral BID   calcitRIOL   0.25 mcg Oral  q AM   diltiazem   360 mg Oral Daily   feeding supplement  237 mL Oral BID BM   gabapentin   300 mg Oral BID   levothyroxine   175 mcg Oral Daily   lipase/protease/amylase  72,000 Units Oral TID AC   melatonin  3 mg Oral QHS   pantoprazole   40 mg Oral BID   sodium chloride  flush  3 mL Intravenous Q12H   torsemide   40 mg Oral Daily   Continuous Infusions:   LOS: 3 days   Time spent:  Elsie JAYSON Mont[place], DO [facility]  If 7PM-7AM, please contact night-coverage [email or web address]  12/05/2024, 8:25 AM      "

## 2024-12-06 NOTE — Progress Notes (Signed)
 Scott Oneal, NT called me to say that pt is on the floor. Pt stated that he was trying to check his legs adding that he was mobile before this admission. Pt was assessed and helped to bed. Vital signs were taken. There were no visible signs of injury found on pt body. Pt was oax4 post fall. Post fall and fall prevention education was given to pt. Scott Oneal (pt son) was called  and a voicemail was left for him to return the calls. [name], MD was notified. Fall prevent order was added. Fall preventions were applied.

## 2024-12-06 NOTE — Progress Notes (Addendum)
" °   12/06/24 0100  What Happened  Was fall witnessed? No  Was patient injured? No  Patient found on floor  Found by Staff-comment  Stated prior activity other (comment)  Provider Notification  Provider Name/Title Dr. Niels Hurst  Date Provider Notified 12/06/24  Time Provider Notified 0150  Method of Notification Page  Notification Reason Fall  Provider response New orders (Fall precaution)  Date of Provider Response 12/06/24  Time of Provider Response 0154  Follow Up  Family notified Yes - comment (Left a voice mail requesting Chad Furber to call back.)  Time family notified 0156  Additional tests No  Progress note created (see row info) Yes  Adult Fall Risk Assessment  Risk Factor Category (scoring not indicated) Fall has occurred during this admission (document High fall risk);High fall risk per protocol (document High fall risk)  Patient Fall Risk Level High fall risk  Adult Fall Risk Interventions  Required Bundle Interventions *See Row Information* High fall risk  Additional Interventions Use of appropriate toileting equipment (bedpan, BSC, etc.)  Screening for Fall Injury Risk (To be completed on HIGH fall risk patients) - Assessing Need for Floor Mats  Risk For Fall Injury- Criteria for Floor Mats Previous fall this admission  Will Implement Floor Mats Yes  Vitals  Temp 97.7 F (36.5 C)  Temp Source Oral  BP 125/82  MAP (mmHg) 92  BP Location Left Arm  BP Method Automatic  Patient Position (if appropriate) Lying  Pulse Rate 91  Pulse Rate Source Monitor  ECG Heart Rate 90  Resp 18  Oxygen  Therapy  SpO2 95 %  O2 Device Room Air  Pain Assessment  Pain Scale 0-10  Pain Score 0  PCA/Epidural/Spinal Assessment  Respiratory Pattern Regular;Unlabored  Neurological  Neuro (WDL) X  Level of Consciousness Alert  Orientation Level Oriented X4  Cognition Follows commands  Speech Clear  R Pupil Size (mm) 3  R Pupil Shape Round  R Pupil Reaction Brisk  L Pupil Size  (mm) 3  L Pupil Shape Round  L Pupil Reaction Brisk  Motor Function/Sensation Assessment Grip;Motor response  R Hand Grip Moderate  L Hand Grip Moderate  RUE Motor Response Purposeful movement  LUE Motor Response Purposeful movement  RLE Motor Response Purposeful movement  LLE Motor Response Purposeful movement  Neuro Symptoms None  Glasgow Coma Scale  Eye Opening 4  Best Verbal Response (NON-intubated) 5  Best Motor Response 6  Glasgow Coma Scale Score 15  Musculoskeletal  Musculoskeletal (WDL) X  Assistive Device Front wheel walker  Generalized Weakness Yes  Weight Bearing Restrictions Per Provider Order No  Integumentary  Integumentary (WDL) X  Skin Color Appropriate for ethnicity  Skin Condition Dry  Skin Integrity Erythema/redness;Ecchymosis  Abrasion Location Arm;Leg  Abrasion Location Orientation Bilateral  Abrasion Intervention Foam  Ecchymosis Location Arm;Leg  Ecchymosis Location Orientation Bilateral  Erythema/Redness Location Buttocks;Groin  Erythema/Redness Location Orientation Bilateral  Skin Turgor Non-tenting   "

## 2024-12-06 NOTE — Plan of Care (Signed)
" °  Problem: Clinical Measurements: Goal: Will remain free from infection Outcome: Progressing   Problem: Clinical Measurements: Goal: Diagnostic test results will improve Outcome: Progressing   Problem: Clinical Measurements: Goal: Respiratory complications will improve Outcome: Progressing   Problem: Activity: Goal: Risk for activity intolerance will decrease Outcome: Progressing   Problem: Clinical Measurements: Goal: Cardiovascular complication will be avoided Outcome: Progressing   Problem: Elimination: Goal: Will not experience complications related to urinary retention Outcome: Progressing    Problem: Safety: Goal: Ability to remain free from injury will improve Outcome: Progressing   Problem: Skin Integrity: Goal: Risk for impaired skin integrity will decrease Outcome: Progressing     "

## 2024-12-06 NOTE — TOC Progression Note (Addendum)
 Transition of Care [facility]) - Progression Note    Patient Details  Name: LUISMARIO COSTON MRN: [id number] Date of Birth: 12/19/1940  [facility] CM/SW Contact  Isaiah Public, LCSWA Phone Number: 12/06/2024, 10:39 AM  Clinical Narrative:     CSW received patients HTA denial letter.  Patients son received HTA denial information.CSW delivered patients denial letter to patients room. Patients son plans on calling to start appeal today for SNF. CSW will continue to follow.  Update- Patients son started appeal. Appeal could take up to 72 hours for determination.                    Expected Discharge Plan and Services                                               Social Drivers of Health (SDOH) Interventions SDOH Screenings   Food Insecurity: No Food Insecurity (11/30/2024)  Housing: Low Risk (11/30/2024)  Transportation Needs: No Transportation Needs (11/30/2024)  Utilities: Not At Risk (11/30/2024)  Alcohol  Screen: Low Risk (01/25/2024)  Depression (PHQ2-9): Low Risk (11/28/2024)  Financial Resource Strain: Low Risk (01/25/2024)  Physical Activity: Inactive (01/25/2024)  Social Connections: Moderately Isolated (11/30/2024)  Stress: No Stress Concern Present (01/25/2024)  Tobacco Use: Low Risk (12/01/2024)  Health Literacy: Adequate Health Literacy (01/25/2024)    Readmission Risk Interventions     No data to display

## 2024-12-06 NOTE — Progress Notes (Signed)
 Pt son, Chad Reifsteck call me back this morning and I notified him of pt fall.

## 2024-12-06 NOTE — Progress Notes (Signed)
 Physical Therapy Treatment Patient Details Name: Scott Oneal MRN: [id number] DOB: 28-Dec-1940 Today's Date: 12/06/2024   History of Present Illness Patient is 84 yo male admitted on 11/30/24 for syncope, likely due to hypotension brought on by tachycardia/a-fib/atrial flutter. 2/6 hospital fall. PMH significant for Afib, macular degeneration, essential hypertension, hyperlipidemia, chronic diastolic heart failure with an EF of 55%, paroxysmal atrial tachycardia history of CAD status post DES, chronic thrombocytopenia of 87 back in December 2025, chronic disease stage IV    PT Comments  Pt received in supine and eager for mobility. Pt required ModAx2 for sit<>stand transfers with use of RW. Worked on progressing gait distance with pt able to ambulate short distances with CGA and +2 for chair follow. One standing and one seated rest break required 2/2 generalized weakness/fatigue. Pt continues to have impaired memory with pt not able to recall falling last night. Pt also has decreased safety awareness and a lack of insight into deficits. Pt is at a high risk of future falls and would benefit from post-acute rehab to maximize rehab potential and reduce risk of hospital readmission. Will continue to follow acutely.      If plan is discharge home, recommend the following: A lot of help with bathing/dressing/bathroom;Two people to help with walking and/or transfers;Assistance with cooking/housework;Direct supervision/assist for medications management;Direct supervision/assist for financial management;Supervision due to cognitive status   Can travel by private vehicle     No  Equipment Recommendations  None recommended by PT       Precautions / Restrictions Precautions Precautions: Fall Recall of Precautions/Restrictions: Impaired Restrictions Weight Bearing Restrictions Per Provider Order: No     Mobility  Bed Mobility Overal bed mobility: Needs Assistance Bed Mobility: Supine to Sit     Supine to sit: Mod assist, HOB elevated    General bed mobility comments: ModA for trunk raise and to scoot hips forward to EOB    Transfers Overall transfer level: Needs assistance Equipment used: Rolling walker (2 wheels) Transfers: Sit to/from Stand Sit to Stand: Mod assist, +2 physical assistance, +2 safety/equipment    General transfer comment: ModAx2 to boost-up from EOB and recliner. Cueing for hand placement and to lean forwards    Ambulation/Gait Ambulation/Gait assistance: Contact guard assist, +2 safety/equipment Gait Distance (Feet): 20 Feet (x3) Assistive device: Rolling walker (2 wheels) Gait Pattern/deviations: Step-through pattern, Decreased stride length, Trunk flexed, Shuffle Gait velocity: decr    General Gait Details: slightly forward flexed trunk with shuffling steps. +2 for close chair follow     Balance Overall balance assessment: Needs assistance Sitting-balance support: No upper extremity supported, Feet supported Sitting balance-Leahy Scale: Poor Sitting balance - Comments: CGA for safety   Standing balance support: Bilateral upper extremity supported, During functional activity, Reliant on assistive device for balance Standing balance-Leahy Scale: Poor Standing balance comment: needs BUE support on RW to maintain balance       Communication Communication Communication: Impaired Factors Affecting Communication: Hearing impaired;Reduced clarity of speech  Cognition Arousal: Alert Behavior During Therapy: WFL for tasks assessed/performed   PT - Cognitive impairments: No family/caregiver present to determine baseline, Safety/Judgement, Awareness, Memory, Problem solving, Sequencing    PT - Cognition Comments: Impaired memory with pt not remembering falling last night Following commands: Impaired Following commands impaired: Follows one step commands with increased time, Follows multi-step commands with increased time, Follows multi-step commands  inconsistently    Cueing Cueing Techniques: Verbal cues, Gestural cues, Tactile cues  Exercises General Exercises - Lower Extremity Long Arc  Quad: AROM, Both, 5 reps, Seated Straight Leg Raises: AROM, Both, 5 reps, Supine        Pertinent Vitals/Pain Pain Assessment Pain Assessment: No/denies pain     PT Goals (current goals can now be found in the care plan section) Acute Rehab PT Goals Patient Stated Goal: to be with his wife PT Goal Formulation: With patient Time For Goal Achievement: 12/15/24 Potential to Achieve Goals: Fair Progress towards PT goals: Progressing toward goals    Frequency    Min 2X/week       AM-PAC PT 6 Clicks Mobility   Outcome Measure  Help needed turning from your back to your side while in a flat bed without using bedrails?: A Little Help needed moving from lying on your back to sitting on the side of a flat bed without using bedrails?: A Lot Help needed moving to and from a bed to a chair (including a wheelchair)?: Total Help needed standing up from a chair using your arms (e.g., wheelchair or bedside chair)?: Total Help needed to walk in hospital room?: A Little Help needed climbing 3-5 steps with a railing? : Total 6 Click Score: 11    End of Session Equipment Utilized During Treatment: Gait belt Activity Tolerance: Patient tolerated treatment well Patient left: in chair;with call bell/phone within reach;with chair alarm set;Other (comment) (fall pad underneath recliner) Nurse Communication: Mobility status PT Visit Diagnosis: Unsteadiness on feet (R26.81);Other abnormalities of gait and mobility (R26.89);Muscle weakness (generalized) (M62.81);History of falling (Z91.81);Difficulty in walking, not elsewhere classified (R26.2)     Time: 8854-8794 PT Time Calculation (min) (ACUTE ONLY): 20 min  Charges:    $Gait Training: 8-22 mins PT General Charges $$ ACUTE PT VISIT: 1 Visit                    Kate ORN, PT, DPT Secure Chat  Preferred  Rehab Office [phone]   Kate BRAVO [name] 12/06/2024, 1:20 PM

## 2024-12-06 NOTE — Progress Notes (Signed)
 " PROGRESS NOTE    Scott Oneal  FMW:[id number] DOB: 1941-04-23 DOA: 11/30/2024 PCP: Garald Karlynn GAILS, MD   Brief Narrative:  Scott Oneal is an 84 y.o. male past medical history significant for essential hypertension, hyperlipidemia, chronic  diastolic heart failure with an EF of 55%, paroxysmal atrial tachycardia,CAD status post DES, chronic thrombocytopenia(80s), chronic kidney disease stage IV discharged from [facility] 10/14/2024 for acute kidney injury on comes in after an episode of loss of consciousness after standing from a fall.  Patient remains medically stable for discharge - awaiting safe dispo - insurance peer-to-peer completed 2/5 - awaiting review.  Assessment & Plan:   Principal Problem:   Atrial flutter with rapid ventricular response (HCC) Active Problems:   Fall at home, initial encounter   Asthmatic bronchitis   Heart failure with preserved ejection fraction (HCC)   Hyperkalemia   CKD (chronic kidney disease) stage 4, GFR 15-29 ml/min (HCC)   Thrombocytopenia   Hypothyroidism   Delirium  Syncope likely due to hypotension brought on by tachycardia/atrial flutter/atrial fibrillation - On max dose diltiazem  - Eliquis  ongoing - low dose to hold given thrombocytopenia as below - IV amiodarone  transitioning to PO per cardiology - 2D echo was done that showed an EF of 55% no regional wall motion abnormality.   Acute on chronic thrombocytopenia: - Currently on Eliquis  no signs of overt bleeding, noted bruises on his arms. - Hemoglobin stable at 14 (his baseline). - Platelets have stabilized around 40 - There is no previous exposure to heparin  seems to be new since last year.   Acute ambulatory dysfunction - Baseline does not use walker/cane at home - PT currently recommending SNF  Acute bronchitis, resolving - Symptoms of cough/wheeze appear to be resolving. - Chest x-ray showed no acute abnormality. - Continue steroid taper.   Acute confusional state,  rule out sundowning - Keep the blinds open during day close at night. - Seems to be resolved.   HFrEF: - 2D echo is unchanged from previous. - Continue torsemide  strict I's and O's and daily weights. - He is becoming hypernatremic and hyperchloremic.   Hyperkalemia: Resolved Hypovolemic hypernatremia  - Increase free water  intake  Chronic pancreatic insufficiency: - Continue Creon .   CKD (chronic kidney disease) stage 4, GFR 15-29 ml/min (HCC) - Baseline usually ranges around 2.4-2.8; currently at baseline - Continue calcitriol .   Hypothyroidism - Continue Synthroid .  DVT prophylaxis: apixaban  (ELIQUIS ) tablet 2.5 mg Start: 12/01/24 1000 SCDs Start: 11/30/24 1055 apixaban  (ELIQUIS ) tablet 2.5 mg   Code Status:   Code Status: Full Code  Family Communication: None present at bedside  Status is: Inpatient  Dispo: The patient is from: Home              Anticipated d/c is to: SNF - pending peer review for authorization              Anticipated d/c date is: 24 to 48 hours              Patient currently is medically stable for discharge  Consultants:  Cardiology  Procedures:  None  Antimicrobials:  None indicated  Subjective: No acute issues or events overnight denies nausea vomiting diarrhea constipation headache fevers chills or chest pain.  Ambulating today without tachycardia dyspnea or symptoms.  Objective: Vitals:   12/05/24 2100 12/06/24 0100 12/06/24 0343 12/06/24 0747  BP: 119/71 125/82 116/64 128/75  Pulse: 93 91 76 94  Resp: 18 18 18 16   Temp: 98.1 F (36.7  C) 97.7 F (36.5 C) 97.9 F (36.6 C) 98.6 F (37 C)  TempSrc: Oral Oral Oral Oral  SpO2: 94% 95%  92%  Weight:      Height:        Intake/Output Summary (Last 24 hours) at 12/06/2024 0819 Last data filed at 12/06/2024 9247 Gross per 24 hour  Intake 1677 ml  Output 2300 ml  Net -623 ml   Filed Weights   12/01/24 1427  Weight: 89.4 kg    Examination:  General:  Pleasantly resting in  bed, No acute distress. HEENT:  Normocephalic atraumatic.  Sclerae nonicteric, noninjected.  Extraocular movements intact bilaterally. Neck:  Without mass or deformity.  Trachea is midline. Lungs:  Clear to auscultate bilaterally without rhonchi, wheeze, or rales. Heart: Irregularly irregular without murmurs, rubs, or gallops. Abdomen:  Soft, nontender, nondistended.  Without guarding or rebound. Extremities: Without cyanosis, clubbing, edema, or obvious deformity. Skin:  Warm and dry, no erythema.  Data Reviewed: I have personally reviewed following labs and imaging studies  CBC: Recent Labs  Lab 11/30/24 2252 12/01/24 0227 12/02/24 0242 12/03/24 1006 12/04/24 0431 12/05/24 0420  WBC 12.0* 8.6 10.9* 7.3 6.1 6.2  NEUTROABS 10.9*  --   --   --   --   --   HGB 13.0 12.5* 13.1 14.8 14.1 13.1  HCT 42.2 40.0 41.8 48.6 45.1 42.6  MCV 86.8 86.0 85.7 87.4 86.9 86.6  PLT 47* 36* 47* 42* 40* 38*   Basic Metabolic Panel: Recent Labs  Lab 11/30/24 0640 12/01/24 0227 12/02/24 0242 12/03/24 0448 12/04/24 0431 12/04/24 1109  NA 142 141 144 146* 148* 143  K 5.2* 5.1 4.7 4.1 4.0 4.0  CL 104 100 101 98 104 100  CO2 26 26 27  32 36* 34*  GLUCOSE 95 186* 112* 263* 139* 220*  BUN 86* 80* 70* 54* 51* 49*  CREATININE 2.69* 2.55* 2.42* 2.17* 2.12* 2.11*  CALCIUM  8.4* 7.8* 8.5* 8.6* 9.0 8.8*  MG 2.4  --  2.3 2.3 2.1  --    GFR: Estimated Creatinine Clearance: 29.1 mL/min (A) (by C-G formula based on SCr of 2.11 mg/dL (H)). Liver Function Tests: Recent Labs  Lab 11/30/24 0640  AST 27  ALT 26  ALKPHOS 62  BILITOT 1.0  PROT 5.3*  ALBUMIN 3.2*   No results for input(s): LIPASE, AMYLASE in the last 168 hours. No results for input(s): AMMONIA in the last 168 hours. Coagulation Profile: Recent Labs  Lab 11/30/24 0703  INR 1.1   Cardiac Enzymes: No results for input(s): CKTOTAL, CKMB, CKMBINDEX, TROPONINI in the last 168 hours. BNP (last 3 results) No results for  input(s): PROBNP in the last 8760 hours. HbA1C: No results for input(s): HGBA1C in the last 72 hours. CBG: No results for input(s): GLUCAP in the last 168 hours. Lipid Profile: No results for input(s): CHOL, HDL, LDLCALC, TRIG, CHOLHDL, LDLDIRECT in the last 72 hours. Thyroid  Function Tests: No results for input(s): TSH, T4TOTAL, FREET4, T3FREE, THYROIDAB in the last 72 hours. Anemia Panel: No results for input(s): VITAMINB12, FOLATE, FERRITIN, TIBC, IRON, RETICCTPCT in the last 72 hours. Sepsis Labs: No results for input(s): PROCALCITON, LATICACIDVEN in the last 168 hours.  No results found for this or any previous visit (from the past 240 hours).       Radiology Studies: No results found.      Scheduled Meds:  amiodarone   400 mg Oral BID   Followed by   Scott Oneal ON 12/15/2024] amiodarone   200 mg  Oral Daily   apixaban   2.5 mg Oral BID   calcitRIOL   0.25 mcg Oral q AM   diltiazem   360 mg Oral Daily   feeding supplement  237 mL Oral BID BM   gabapentin   300 mg Oral BID   levothyroxine   175 mcg Oral Daily   lipase/protease/amylase  72,000 Units Oral TID AC   melatonin  3 mg Oral QHS   pantoprazole   40 mg Oral BID   sodium chloride  flush  3 mL Intravenous Q12H   torsemide   40 mg Oral Daily   Continuous Infusions:   LOS: 4 days   Time spent:  Scott JAYSON Mont[place], DO [facility]  If 7PM-7AM, please contact night-coverage [email or web address]  12/06/2024, 8:19 AM      "

## 2024-12-13 ENCOUNTER — Ambulatory Visit: Admitting: Emergency Medicine

## 2025-01-20 ENCOUNTER — Ambulatory Visit: Admitting: Podiatry

## 2025-02-03 ENCOUNTER — Encounter: Admitting: Internal Medicine

## 2025-02-03 ENCOUNTER — Ambulatory Visit

## 2025-02-12 ENCOUNTER — Ambulatory Visit: Admitting: Internal Medicine
